# Patient Record
Sex: Male | Born: 1980 | Race: Black or African American | Hispanic: No | Marital: Single | Smoking: Never smoker
Health system: Southern US, Community
[De-identification: ages and names within clinical notes are randomized; demographics above are authoritative.]

## PROBLEM LIST (undated history)

## (undated) DIAGNOSIS — I10 Essential (primary) hypertension: Secondary | ICD-10-CM

## (undated) DIAGNOSIS — K529 Noninfective gastroenteritis and colitis, unspecified: Secondary | ICD-10-CM

## (undated) DIAGNOSIS — E119 Type 2 diabetes mellitus without complications: Secondary | ICD-10-CM

## (undated) DIAGNOSIS — Z21 Asymptomatic human immunodeficiency virus [HIV] infection status: Secondary | ICD-10-CM

## (undated) DIAGNOSIS — A539 Syphilis, unspecified: Secondary | ICD-10-CM

## (undated) DIAGNOSIS — C801 Malignant (primary) neoplasm, unspecified: Secondary | ICD-10-CM

## (undated) DIAGNOSIS — J45909 Unspecified asthma, uncomplicated: Secondary | ICD-10-CM

## (undated) HISTORY — DX: Type 2 diabetes mellitus without complications: E11.9

## (undated) HISTORY — DX: Malignant (primary) neoplasm, unspecified: C80.1

## (undated) HISTORY — DX: Essential (primary) hypertension: I10

## (undated) HISTORY — PX: BRAIN SURGERY: SHX531

## (undated) HISTORY — DX: Asymptomatic human immunodeficiency virus (hiv) infection status: Z21

## (undated) HISTORY — DX: Syphilis, unspecified: A53.9

---
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
## 2009-03-21 ENCOUNTER — Ambulatory Visit: Payer: Self-pay | Admitting: Surgery

## 2009-03-21 ENCOUNTER — Inpatient Hospital Stay (HOSPITAL_COMMUNITY): Admission: EM | Admit: 2009-03-21 | Discharge: 2009-04-02 | Payer: Self-pay | Admitting: Emergency Medicine

## 2009-03-21 ENCOUNTER — Encounter (INDEPENDENT_AMBULATORY_CARE_PROVIDER_SITE_OTHER): Payer: Self-pay | Admitting: Emergency Medicine

## 2009-03-22 ENCOUNTER — Ambulatory Visit: Payer: Self-pay | Admitting: Internal Medicine

## 2009-03-22 ENCOUNTER — Ambulatory Visit: Payer: Self-pay | Admitting: Oncology

## 2009-03-23 ENCOUNTER — Ambulatory Visit: Payer: Self-pay | Admitting: Infectious Diseases

## 2009-03-23 ENCOUNTER — Encounter (INDEPENDENT_AMBULATORY_CARE_PROVIDER_SITE_OTHER): Payer: Self-pay | Admitting: Interventional Radiology

## 2009-03-23 DIAGNOSIS — A539 Syphilis, unspecified: Secondary | ICD-10-CM

## 2009-03-23 DIAGNOSIS — Z21 Asymptomatic human immunodeficiency virus [HIV] infection status: Secondary | ICD-10-CM

## 2009-03-23 HISTORY — DX: Syphilis, unspecified: A53.9

## 2009-03-23 HISTORY — DX: Asymptomatic human immunodeficiency virus (hiv) infection status: Z21

## 2009-03-24 ENCOUNTER — Encounter (INDEPENDENT_AMBULATORY_CARE_PROVIDER_SITE_OTHER): Payer: Self-pay | Admitting: Interventional Radiology

## 2009-03-24 LAB — CONVERTED CEMR LAB: RPR Ser Ql: 1:2 {titer}

## 2009-03-31 ENCOUNTER — Ambulatory Visit: Payer: Self-pay | Admitting: Oncology

## 2009-04-06 ENCOUNTER — Ambulatory Visit: Payer: Self-pay | Admitting: Infectious Diseases

## 2009-04-06 DIAGNOSIS — A539 Syphilis, unspecified: Secondary | ICD-10-CM | POA: Insufficient documentation

## 2009-04-07 LAB — CBC WITH DIFFERENTIAL/PLATELET
Eosinophils Absolute: 0.3 10*3/uL (ref 0.0–0.5)
HCT: 31.6 % — ABNORMAL LOW (ref 38.4–49.9)
LYMPH%: 12.1 % — ABNORMAL LOW (ref 14.0–49.0)
MCHC: 32 g/dL (ref 32.0–36.0)
MCV: 74.7 fL — ABNORMAL LOW (ref 79.3–98.0)
MONO%: 5 % (ref 0.0–14.0)
NEUT#: 10.6 10*3/uL — ABNORMAL HIGH (ref 1.5–6.5)
NEUT%: 80.2 % — ABNORMAL HIGH (ref 39.0–75.0)
Platelets: 175 10*3/uL (ref 140–400)
RBC: 4.23 10*6/uL (ref 4.20–5.82)
nRBC: 0 % (ref 0–0)

## 2009-04-10 ENCOUNTER — Encounter (INDEPENDENT_AMBULATORY_CARE_PROVIDER_SITE_OTHER): Payer: Self-pay | Admitting: *Deleted

## 2009-04-10 DIAGNOSIS — B20 Human immunodeficiency virus [HIV] disease: Secondary | ICD-10-CM

## 2009-04-10 DIAGNOSIS — I10 Essential (primary) hypertension: Secondary | ICD-10-CM | POA: Insufficient documentation

## 2009-04-10 DIAGNOSIS — B370 Candidal stomatitis: Secondary | ICD-10-CM | POA: Insufficient documentation

## 2009-04-10 DIAGNOSIS — I890 Lymphedema, not elsewhere classified: Secondary | ICD-10-CM

## 2009-04-10 LAB — CBC WITH DIFFERENTIAL/PLATELET
Basophils Absolute: 0.1 10*3/uL (ref 0.0–0.1)
Eosinophils Absolute: 0.3 10*3/uL (ref 0.0–0.5)
HCT: 32.4 % — ABNORMAL LOW (ref 38.4–49.9)
HGB: 10.4 g/dL — ABNORMAL LOW (ref 13.0–17.1)
NEUT#: 3.2 10*3/uL (ref 1.5–6.5)
RDW: 15.2 % — ABNORMAL HIGH (ref 11.0–14.6)
lymph#: 1.3 10*3/uL (ref 0.9–3.3)

## 2009-04-13 ENCOUNTER — Ambulatory Visit: Payer: Self-pay | Admitting: Infectious Diseases

## 2009-04-16 ENCOUNTER — Ambulatory Visit (HOSPITAL_COMMUNITY): Admission: RE | Admit: 2009-04-16 | Discharge: 2009-04-16 | Payer: Self-pay | Admitting: Oncology

## 2009-04-21 ENCOUNTER — Encounter: Payer: Self-pay | Admitting: Infectious Diseases

## 2009-04-21 ENCOUNTER — Ambulatory Visit: Payer: Self-pay | Admitting: Oncology

## 2009-04-21 LAB — CBC WITH DIFFERENTIAL/PLATELET
Basophils Absolute: 0 10*3/uL (ref 0.0–0.1)
Eosinophils Absolute: 0.1 10*3/uL (ref 0.0–0.5)
HCT: 30.7 % — ABNORMAL LOW (ref 38.4–49.9)
HGB: 10 g/dL — ABNORMAL LOW (ref 13.0–17.1)
LYMPH%: 26.3 % (ref 14.0–49.0)
MONO#: 0.3 10*3/uL (ref 0.1–0.9)
NEUT#: 1.4 10*3/uL — ABNORMAL LOW (ref 1.5–6.5)
NEUT%: 56.3 % (ref 39.0–75.0)
Platelets: 228 10*3/uL (ref 140–400)
WBC: 2.5 10*3/uL — ABNORMAL LOW (ref 4.0–10.3)

## 2009-04-21 LAB — COMPREHENSIVE METABOLIC PANEL
CO2: 24 mEq/L (ref 19–32)
Glucose, Bld: 88 mg/dL (ref 70–99)
Sodium: 140 mEq/L (ref 135–145)
Total Bilirubin: 2.2 mg/dL — ABNORMAL HIGH (ref 0.3–1.2)
Total Protein: 7.7 g/dL (ref 6.0–8.3)

## 2009-04-29 ENCOUNTER — Ambulatory Visit: Payer: Self-pay | Admitting: Family Medicine

## 2009-04-29 ENCOUNTER — Encounter: Payer: Self-pay | Admitting: Infectious Diseases

## 2009-04-29 LAB — MANUAL DIFFERENTIAL (CHCC SATELLITE)
ANC (CHCC HP manual diff): 1.1 10*3/uL — ABNORMAL LOW (ref 1.5–6.5)
Blasts: 1 % — ABNORMAL HIGH (ref 0–0)
Eos: 10 % — ABNORMAL HIGH (ref 0–7)
LYMPH: 28 % (ref 14–48)
MONO: 15 % — ABNORMAL HIGH (ref 0–13)
PLT EST ~~LOC~~: ADEQUATE
Variant Lymph: 13 % — ABNORMAL HIGH (ref 0–0)

## 2009-04-29 LAB — CMP (CANCER CENTER ONLY)
AST: 26 U/L (ref 11–38)
Albumin: 3.7 g/dL (ref 3.3–5.5)
Alkaline Phosphatase: 90 U/L — ABNORMAL HIGH (ref 26–84)
Glucose, Bld: 114 mg/dL (ref 73–118)
Potassium: 4.2 mEq/L (ref 3.3–4.7)
Sodium: 138 mEq/L (ref 128–145)
Total Protein: 8 g/dL (ref 6.4–8.1)

## 2009-04-29 LAB — CBC WITH DIFFERENTIAL (CANCER CENTER ONLY)
MCV: 80 fL — ABNORMAL LOW (ref 82–98)
Platelets: 220 10*3/uL (ref 145–400)
RDW: 19 % — ABNORMAL HIGH (ref 10.5–14.6)
WBC: 3.3 10*3/uL — ABNORMAL LOW (ref 4.0–10.0)

## 2009-04-29 LAB — LACTATE DEHYDROGENASE: LDH: 229 U/L (ref 94–250)

## 2009-05-01 ENCOUNTER — Telehealth (INDEPENDENT_AMBULATORY_CARE_PROVIDER_SITE_OTHER): Payer: Self-pay | Admitting: *Deleted

## 2009-05-06 ENCOUNTER — Encounter: Payer: Self-pay | Admitting: Infectious Diseases

## 2009-05-06 LAB — MORPHOLOGY - CHCC SATELLITE

## 2009-05-06 LAB — CBC WITH DIFFERENTIAL (CANCER CENTER ONLY)
BASO#: 0 10*3/uL (ref 0.0–0.2)
BASO%: 0.4 % (ref 0.0–2.0)
EOS%: 14.1 % — ABNORMAL HIGH (ref 0.0–7.0)
HGB: 12 g/dL — ABNORMAL LOW (ref 13.0–17.1)
LYMPH#: 1.9 10*3/uL (ref 0.9–3.3)
MCHC: 31.6 g/dL — ABNORMAL LOW (ref 32.0–35.9)
NEUT#: 1.8 10*3/uL (ref 1.5–6.5)
Platelets: 203 10*3/uL (ref 145–400)
RDW: 19.3 % — ABNORMAL HIGH (ref 10.5–14.6)

## 2009-05-06 LAB — CMP (CANCER CENTER ONLY)
ALT(SGPT): 19 U/L (ref 10–47)
AST: 26 U/L (ref 11–38)
Creat: 0.9 mg/dl (ref 0.6–1.2)
Total Bilirubin: 1.4 mg/dl (ref 0.20–1.60)

## 2009-05-26 ENCOUNTER — Ambulatory Visit: Payer: Self-pay | Admitting: Oncology

## 2009-05-27 ENCOUNTER — Encounter: Payer: Self-pay | Admitting: Infectious Diseases

## 2009-05-27 LAB — CBC WITH DIFFERENTIAL (CANCER CENTER ONLY)
BASO#: 0 10*3/uL (ref 0.0–0.2)
EOS%: 4.4 % (ref 0.0–7.0)
HCT: 39.6 % (ref 38.7–49.9)
HGB: 12.2 g/dL — ABNORMAL LOW (ref 13.0–17.1)
LYMPH#: 1.5 10*3/uL (ref 0.9–3.3)
MCH: 25 pg — ABNORMAL LOW (ref 28.0–33.4)
MCHC: 30.8 g/dL — ABNORMAL LOW (ref 32.0–35.9)
MCV: 81 fL — ABNORMAL LOW (ref 82–98)
MONO%: 13.8 % — ABNORMAL HIGH (ref 0.0–13.0)
NEUT%: 38.1 % — ABNORMAL LOW (ref 40.0–80.0)

## 2009-05-27 LAB — CMP (CANCER CENTER ONLY)
Albumin: 3.4 g/dL (ref 3.3–5.5)
Alkaline Phosphatase: 106 U/L — ABNORMAL HIGH (ref 26–84)
BUN, Bld: 15 mg/dL (ref 7–22)
CO2: 28 mEq/L (ref 18–33)
Calcium: 9.2 mg/dL (ref 8.0–10.3)
Chloride: 105 mEq/L (ref 98–108)
Glucose, Bld: 98 mg/dL (ref 73–118)
Potassium: 4.3 mEq/L (ref 3.3–4.7)
Sodium: 138 mEq/L (ref 128–145)
Total Protein: 8 g/dL (ref 6.4–8.1)

## 2009-06-03 LAB — CBC WITH DIFFERENTIAL (CANCER CENTER ONLY)
HGB: 12.5 g/dL — ABNORMAL LOW (ref 13.0–17.1)
MCH: 25.5 pg — ABNORMAL LOW (ref 28.0–33.4)
MCHC: 31.4 g/dL — ABNORMAL LOW (ref 32.0–35.9)

## 2009-06-03 LAB — MANUAL DIFFERENTIAL (CHCC SATELLITE)
ALC: 1.5 10*3/uL (ref 0.9–3.3)
ANC (CHCC HP manual diff): 12.1 10*3/uL — ABNORMAL HIGH (ref 1.5–6.5)
Band Neutrophils: 6 % (ref 0–10)
Platelet Morphology: NORMAL

## 2009-06-03 LAB — IRON AND TIBC: %SAT: 39 % (ref 20–55)

## 2009-06-03 LAB — FERRITIN: Ferritin: 287 ng/mL (ref 22–322)

## 2009-06-03 LAB — BASIC METABOLIC PANEL - CANCER CENTER ONLY
Calcium: 9.3 mg/dL (ref 8.0–10.3)
Glucose, Bld: 94 mg/dL (ref 73–118)
Potassium: 4.4 mEq/L (ref 3.3–4.7)
Sodium: 138 mEq/L (ref 128–145)

## 2009-06-16 ENCOUNTER — Encounter: Payer: Self-pay | Admitting: Oncology

## 2009-06-16 ENCOUNTER — Ambulatory Visit: Payer: Self-pay

## 2009-06-18 ENCOUNTER — Ambulatory Visit (HOSPITAL_COMMUNITY): Admission: RE | Admit: 2009-06-18 | Discharge: 2009-06-18 | Payer: Self-pay | Admitting: Oncology

## 2009-06-19 ENCOUNTER — Ambulatory Visit: Payer: Self-pay | Admitting: Oncology

## 2009-06-24 ENCOUNTER — Encounter: Payer: Self-pay | Admitting: Infectious Diseases

## 2009-06-24 LAB — CBC WITH DIFFERENTIAL (CANCER CENTER ONLY)
BASO#: 0 10*3/uL (ref 0.0–0.2)
EOS%: 4 % (ref 0.0–7.0)
HCT: 43.3 % (ref 38.7–49.9)
LYMPH#: 1.3 10*3/uL (ref 0.9–3.3)
LYMPH%: 37.2 % (ref 14.0–48.0)
MCH: 25.2 pg — ABNORMAL LOW (ref 28.0–33.4)
MCHC: 30.8 g/dL — ABNORMAL LOW (ref 32.0–35.9)
MCV: 82 fL (ref 82–98)
MONO%: 9.8 % (ref 0.0–13.0)
NEUT%: 48.2 % (ref 40.0–80.0)
RDW: 15.2 % — ABNORMAL HIGH (ref 10.5–14.6)

## 2009-06-24 LAB — CMP (CANCER CENTER ONLY)
ALT(SGPT): 20 U/L (ref 10–47)
BUN, Bld: 13 mg/dL (ref 7–22)
CO2: 30 mEq/L (ref 18–33)
Calcium: 9.1 mg/dL (ref 8.0–10.3)
Chloride: 100 mEq/L (ref 98–108)
Creat: 1 mg/dl (ref 0.6–1.2)
Total Bilirubin: 3.7 mg/dl — ABNORMAL HIGH (ref 0.20–1.60)

## 2009-07-02 ENCOUNTER — Encounter: Payer: Self-pay | Admitting: Infectious Diseases

## 2009-07-02 LAB — CBC WITH DIFFERENTIAL (CANCER CENTER ONLY)
HCT: 43.9 % (ref 38.7–49.9)
HGB: 13.8 g/dL (ref 13.0–17.1)
MCV: 82 fL (ref 82–98)
RBC: 5.38 10*6/uL (ref 4.20–5.70)
WBC: 9.3 10*3/uL (ref 4.0–10.0)

## 2009-07-02 LAB — BASIC METABOLIC PANEL - CANCER CENTER ONLY
CO2: 31 mEq/L (ref 18–33)
Calcium: 8.8 mg/dL (ref 8.0–10.3)
Chloride: 110 mEq/L — ABNORMAL HIGH (ref 98–108)
Glucose, Bld: 96 mg/dL (ref 73–118)
Sodium: 139 mEq/L (ref 128–145)

## 2009-07-02 LAB — MANUAL DIFFERENTIAL (CHCC SATELLITE)
Eos: 1 % (ref 0–7)
LYMPH: 14 % (ref 14–48)
MONO: 4 % (ref 0–13)
SEG: 81 % — ABNORMAL HIGH (ref 40–75)

## 2009-07-20 ENCOUNTER — Ambulatory Visit: Payer: Self-pay | Admitting: Oncology

## 2009-08-13 ENCOUNTER — Ambulatory Visit: Payer: Self-pay | Admitting: Oncology

## 2009-08-18 ENCOUNTER — Encounter: Payer: Self-pay | Admitting: Infectious Diseases

## 2009-08-18 LAB — CMP (CANCER CENTER ONLY)
BUN, Bld: 20 mg/dL (ref 7–22)
CO2: 31 mEq/L (ref 18–33)
Creat: 1 mg/dl (ref 0.6–1.2)
Glucose, Bld: 91 mg/dL (ref 73–118)
Total Bilirubin: 2.3 mg/dl — ABNORMAL HIGH (ref 0.20–1.60)

## 2009-08-18 LAB — CBC WITH DIFFERENTIAL (CANCER CENTER ONLY)
BASO#: 0 10*3/uL (ref 0.0–0.2)
BASO%: 0.6 % (ref 0.0–2.0)
Eosinophils Absolute: 0.1 10*3/uL (ref 0.0–0.5)
HCT: 46.1 % (ref 38.7–49.9)
LYMPH%: 41.8 % (ref 14.0–48.0)
MCV: 83 fL (ref 82–98)
MONO#: 0.3 10*3/uL (ref 0.1–0.9)
NEUT%: 49.7 % (ref 40.0–80.0)
RDW: 13.2 % (ref 10.5–14.6)
WBC: 4.8 10*3/uL (ref 4.0–10.0)

## 2009-08-26 ENCOUNTER — Encounter: Payer: Self-pay | Admitting: Infectious Diseases

## 2009-08-26 LAB — CBC WITH DIFFERENTIAL (CANCER CENTER ONLY)
Eosinophils Absolute: 0.2 10*3/uL (ref 0.0–0.5)
HCT: 48.1 % (ref 38.7–49.9)
HGB: 15.2 g/dL (ref 13.0–17.1)
LYMPH#: 2 10*3/uL (ref 0.9–3.3)
LYMPH%: 23.5 % (ref 14.0–48.0)
MCV: 82 fL (ref 82–98)
MONO#: 1.9 10*3/uL — ABNORMAL HIGH (ref 0.1–0.9)
NEUT%: 51.5 % (ref 40.0–80.0)
RBC: 5.83 10*6/uL — ABNORMAL HIGH (ref 4.20–5.70)
WBC: 8.6 10*3/uL (ref 4.0–10.0)

## 2009-08-26 LAB — BASIC METABOLIC PANEL - CANCER CENTER ONLY
CO2: 31 mEq/L (ref 18–33)
Calcium: 9.5 mg/dL (ref 8.0–10.3)
Chloride: 101 mEq/L (ref 98–108)
Creat: 1.3 mg/dl — ABNORMAL HIGH (ref 0.6–1.2)
Glucose, Bld: 95 mg/dL (ref 73–118)

## 2009-09-16 ENCOUNTER — Ambulatory Visit: Payer: Self-pay | Admitting: Oncology

## 2009-09-22 LAB — CBC WITH DIFFERENTIAL (CANCER CENTER ONLY)
BASO%: 0.5 % (ref 0.0–2.0)
LYMPH%: 43 % (ref 14.0–48.0)
MCH: 25.8 pg — ABNORMAL LOW (ref 28.0–33.4)
MCV: 79 fL — ABNORMAL LOW (ref 82–98)
MONO%: 6 % (ref 0.0–13.0)
Platelets: 206 10*3/uL (ref 145–400)
RDW: 14.1 % (ref 10.5–14.6)
WBC: 5.1 10*3/uL (ref 4.0–10.0)

## 2009-09-22 LAB — CMP (CANCER CENTER ONLY)
ALT(SGPT): 30 U/L (ref 10–47)
Alkaline Phosphatase: 113 U/L — ABNORMAL HIGH (ref 26–84)
Sodium: 134 mEq/L (ref 128–145)
Total Bilirubin: 3.7 mg/dl — ABNORMAL HIGH (ref 0.20–1.60)
Total Protein: 8.6 g/dL — ABNORMAL HIGH (ref 6.4–8.1)

## 2009-09-29 ENCOUNTER — Encounter: Payer: Self-pay | Admitting: Infectious Diseases

## 2009-09-29 LAB — CMP (CANCER CENTER ONLY)
ALT(SGPT): 28 U/L (ref 10–47)
AST: 23 U/L (ref 11–38)
Albumin: 3.8 g/dL (ref 3.3–5.5)
Calcium: 9 mg/dL (ref 8.0–10.3)
Chloride: 104 mEq/L (ref 98–108)
Potassium: 4.4 mEq/L (ref 3.3–4.7)
Sodium: 138 mEq/L (ref 128–145)

## 2009-09-29 LAB — MANUAL DIFFERENTIAL (CHCC SATELLITE)
Band Neutrophils: 6 % (ref 0–10)
Eos: 2 % (ref 0–7)

## 2009-09-29 LAB — CBC WITH DIFFERENTIAL (CANCER CENTER ONLY)
Platelets: 149 10*3/uL (ref 145–400)
RDW: 14.4 % (ref 10.5–14.6)
WBC: 13.8 10*3/uL — ABNORMAL HIGH (ref 4.0–10.0)

## 2009-10-08 ENCOUNTER — Encounter: Payer: Self-pay | Admitting: Infectious Diseases

## 2009-10-09 LAB — CMP (CANCER CENTER ONLY)
ALT(SGPT): 18 U/L (ref 10–47)
Alkaline Phosphatase: 100 U/L — ABNORMAL HIGH (ref 26–84)
CO2: 30 mEq/L (ref 18–33)
Potassium: 4.3 mEq/L (ref 3.3–4.7)
Sodium: 136 mEq/L (ref 128–145)
Total Bilirubin: 3.9 mg/dl — ABNORMAL HIGH (ref 0.20–1.60)
Total Protein: 7.8 g/dL (ref 6.4–8.1)

## 2009-10-09 LAB — CBC WITH DIFFERENTIAL (CANCER CENTER ONLY)
BASO%: 0.9 % (ref 0.0–2.0)
LYMPH%: 39.1 % (ref 14.0–48.0)
MCV: 79 fL — ABNORMAL LOW (ref 82–98)
MONO#: 0.2 10*3/uL (ref 0.1–0.9)
NEUT#: 1.7 10*3/uL (ref 1.5–6.5)
Platelets: 202 10*3/uL (ref 145–400)
RDW: 14.4 % (ref 10.5–14.6)
WBC: 3.3 10*3/uL — ABNORMAL LOW (ref 4.0–10.0)

## 2009-10-12 ENCOUNTER — Ambulatory Visit (HOSPITAL_COMMUNITY): Admission: RE | Admit: 2009-10-12 | Discharge: 2009-10-12 | Payer: Self-pay | Admitting: Oncology

## 2009-10-14 ENCOUNTER — Ambulatory Visit (HOSPITAL_COMMUNITY): Admission: RE | Admit: 2009-10-14 | Discharge: 2009-10-14 | Payer: Self-pay | Admitting: Oncology

## 2009-10-16 ENCOUNTER — Ambulatory Visit: Payer: Self-pay | Admitting: Oncology

## 2009-10-20 ENCOUNTER — Encounter: Payer: Self-pay | Admitting: Infectious Diseases

## 2009-11-03 ENCOUNTER — Encounter: Payer: Self-pay | Admitting: Infectious Diseases

## 2009-11-10 ENCOUNTER — Ambulatory Visit: Payer: Self-pay | Admitting: Infectious Diseases

## 2009-11-10 LAB — CONVERTED CEMR LAB
ALT: 18 units/L (ref 0–53)
Albumin: 4.3 g/dL (ref 3.5–5.2)
CO2: 21 meq/L (ref 19–32)
Calcium: 9.5 mg/dL (ref 8.4–10.5)
Chloride: 104 meq/L (ref 96–112)
Creatinine, Ser: 1.17 mg/dL (ref 0.40–1.50)
Eosinophils Absolute: 0.2 10*3/uL (ref 0.0–0.7)
HIV 1 RNA Quant: <48 copies/mL (ref <48)
Lymphocytes Relative: 39 % (ref 12–46)
Lymphs Abs: 1.8 10*3/uL (ref 0.7–4.0)
MCV: 78.5 fL (ref 78.0–100.0)
Monocytes Relative: 8 % (ref 3–12)
Neutrophils Relative %: 50 % (ref 43–77)
Potassium: 4.2 meq/L (ref 3.5–5.3)
RBC: 5.94 M/uL — ABNORMAL HIGH (ref 4.22–5.81)
Sodium: 139 meq/L (ref 135–145)
Total Protein: 8.3 g/dL (ref 6.0–8.3)
WBC: 4.6 10*3/uL (ref 4.0–10.5)

## 2009-11-19 ENCOUNTER — Ambulatory Visit: Payer: Self-pay | Admitting: Oncology

## 2009-11-20 ENCOUNTER — Encounter: Payer: Self-pay | Admitting: Infectious Diseases

## 2009-11-20 LAB — CBC WITH DIFFERENTIAL (CANCER CENTER ONLY)
BASO#: 0 10*3/uL (ref 0.0–0.2)
Eosinophils Absolute: 0.2 10*3/uL (ref 0.0–0.5)
HCT: 47.6 % (ref 38.7–49.9)
HGB: 15.5 g/dL (ref 13.0–17.1)
LYMPH#: 1.8 10*3/uL (ref 0.9–3.3)
LYMPH%: 46.5 % (ref 14.0–48.0)
MCV: 78 fL — ABNORMAL LOW (ref 82–98)
MONO#: 0.3 10*3/uL (ref 0.1–0.9)
NEUT%: 42.3 % (ref 40.0–80.0)
RBC: 6.06 10*6/uL — ABNORMAL HIGH (ref 4.20–5.70)
RDW: 14.3 % (ref 10.5–14.6)
WBC: 3.9 10*3/uL — ABNORMAL LOW (ref 4.0–10.0)

## 2009-11-20 LAB — CMP (CANCER CENTER ONLY)
ALT(SGPT): 22 U/L (ref 10–47)
AST: 26 U/L (ref 11–38)
Alkaline Phosphatase: 110 U/L — ABNORMAL HIGH (ref 26–84)
BUN, Bld: 13 mg/dL (ref 7–22)
Calcium: 8.5 mg/dL (ref 8.0–10.3)
Creat: 1.1 mg/dl (ref 0.6–1.2)
Total Bilirubin: 6.2 mg/dl — ABNORMAL HIGH (ref 0.20–1.60)

## 2009-11-20 LAB — HEPATIC FUNCTION PANEL
AST: 21 U/L (ref 0–37)
Albumin: 3.9 g/dL (ref 3.5–5.2)
Alkaline Phosphatase: 114 U/L (ref 39–117)
Indirect Bilirubin: 6.4 mg/dL — ABNORMAL HIGH (ref 0.0–0.9)
Total Protein: 8 g/dL (ref 6.0–8.3)

## 2009-11-23 ENCOUNTER — Ambulatory Visit: Payer: Self-pay | Admitting: Infectious Diseases

## 2009-11-23 ENCOUNTER — Ambulatory Visit (HOSPITAL_COMMUNITY): Admission: RE | Admit: 2009-11-23 | Discharge: 2009-11-23 | Payer: Self-pay | Admitting: Oncology

## 2009-12-01 ENCOUNTER — Encounter: Payer: Self-pay | Admitting: Infectious Diseases

## 2009-12-01 LAB — CBC WITH DIFFERENTIAL (CANCER CENTER ONLY)
BASO#: 0 10*3/uL (ref 0.0–0.2)
EOS%: 0.8 % (ref 0.0–7.0)
Eosinophils Absolute: 0 10*3/uL (ref 0.0–0.5)
LYMPH%: 23 % (ref 14.0–48.0)
MCH: 25.8 pg — ABNORMAL LOW (ref 28.0–33.4)
MCHC: 32 g/dL (ref 32.0–35.9)
MCV: 81 fL — ABNORMAL LOW (ref 82–98)
MONO%: 1 % (ref 0.0–13.0)
NEUT#: 3.3 10*3/uL (ref 1.5–6.5)
Platelets: 207 10*3/uL (ref 145–400)
RBC: 6.36 10*6/uL — ABNORMAL HIGH (ref 4.20–5.70)

## 2009-12-01 LAB — CMP (CANCER CENTER ONLY)
AST: 34 U/L (ref 11–38)
Alkaline Phosphatase: 122 U/L — ABNORMAL HIGH (ref 26–84)
BUN, Bld: 15 mg/dL (ref 7–22)
Glucose, Bld: 162 mg/dL — ABNORMAL HIGH (ref 73–118)
Sodium: 133 mEq/L (ref 128–145)
Total Bilirubin: 1.1 mg/dl (ref 0.20–1.60)

## 2009-12-08 ENCOUNTER — Encounter: Payer: Self-pay | Admitting: Infectious Diseases

## 2009-12-08 LAB — BASIC METABOLIC PANEL - CANCER CENTER ONLY
BUN, Bld: 13 mg/dL (ref 7–22)
Calcium: 9.2 mg/dL (ref 8.0–10.3)
Creat: 1 mg/dl (ref 0.6–1.2)
Glucose, Bld: 98 mg/dL (ref 73–118)
Potassium: 5 mEq/L — ABNORMAL HIGH (ref 3.3–4.7)

## 2009-12-08 LAB — CBC WITH DIFFERENTIAL (CANCER CENTER ONLY)
BASO#: 0 10*3/uL (ref 0.0–0.2)
EOS%: 7.5 % — ABNORMAL HIGH (ref 0.0–7.0)
Eosinophils Absolute: 0.2 10*3/uL (ref 0.0–0.5)
HCT: 45.7 % (ref 38.7–49.9)
HGB: 14.4 g/dL (ref 13.0–17.1)
LYMPH%: 53.8 % — ABNORMAL HIGH (ref 14.0–48.0)
MCH: 25.4 pg — ABNORMAL LOW (ref 28.0–33.4)
MCHC: 31.5 g/dL — ABNORMAL LOW (ref 32.0–35.9)
MCV: 81 fL — ABNORMAL LOW (ref 82–98)
MONO%: 4.1 % (ref 0.0–13.0)
NEUT%: 34 % — ABNORMAL LOW (ref 40.0–80.0)
RBC: 5.67 10*6/uL (ref 4.20–5.70)

## 2009-12-15 ENCOUNTER — Ambulatory Visit: Payer: Self-pay | Admitting: Oncology

## 2009-12-22 ENCOUNTER — Encounter: Payer: Self-pay | Admitting: Infectious Diseases

## 2009-12-22 LAB — CBC WITH DIFFERENTIAL (CANCER CENTER ONLY)
BASO#: 0.1 10*3/uL (ref 0.0–0.2)
BASO%: 0.8 % (ref 0.0–2.0)
HCT: 47 % (ref 38.7–49.9)
HGB: 14.9 g/dL (ref 13.0–17.1)
LYMPH%: 9.3 % — ABNORMAL LOW (ref 14.0–48.0)
MCHC: 31.7 g/dL — ABNORMAL LOW (ref 32.0–35.9)
MCV: 80 fL — ABNORMAL LOW (ref 82–98)
MONO#: 1.4 10*3/uL — ABNORMAL HIGH (ref 0.1–0.9)
NEUT%: 80.3 % — ABNORMAL HIGH (ref 40.0–80.0)
RDW: 14.6 % (ref 10.5–14.6)

## 2009-12-22 LAB — CMP (CANCER CENTER ONLY)
ALT(SGPT): 31 U/L (ref 10–47)
BUN, Bld: 17 mg/dL (ref 7–22)
CO2: 27 mEq/L (ref 18–33)
Calcium: 9.7 mg/dL (ref 8.0–10.3)
Creat: 1.3 mg/dl — ABNORMAL HIGH (ref 0.6–1.2)
Total Bilirubin: 0.9 mg/dl (ref 0.20–1.60)

## 2009-12-23 ENCOUNTER — Ambulatory Visit: Payer: Self-pay | Admitting: Infectious Diseases

## 2009-12-29 ENCOUNTER — Inpatient Hospital Stay (HOSPITAL_COMMUNITY): Admission: EM | Admit: 2009-12-29 | Discharge: 2010-01-06 | Payer: Self-pay | Admitting: Emergency Medicine

## 2009-12-29 ENCOUNTER — Ambulatory Visit: Payer: Self-pay | Admitting: Internal Medicine

## 2009-12-29 ENCOUNTER — Encounter: Payer: Self-pay | Admitting: Internal Medicine

## 2009-12-31 ENCOUNTER — Ambulatory Visit: Payer: Self-pay | Admitting: Internal Medicine

## 2010-01-01 LAB — CONVERTED CEMR LAB: Hemoglobin: 11.2 g/dL

## 2010-01-06 ENCOUNTER — Encounter: Payer: Self-pay | Admitting: Internal Medicine

## 2010-01-06 DIAGNOSIS — N259 Disorder resulting from impaired renal tubular function, unspecified: Secondary | ICD-10-CM | POA: Insufficient documentation

## 2010-01-06 DIAGNOSIS — J159 Unspecified bacterial pneumonia: Secondary | ICD-10-CM | POA: Insufficient documentation

## 2010-01-06 LAB — CONVERTED CEMR LAB
Hemoglobin: 11.2 g/dL
Platelets: 199 10*3/uL
WBC: 30.3 10*3/uL

## 2010-01-12 ENCOUNTER — Encounter: Payer: Self-pay | Admitting: Infectious Diseases

## 2010-01-12 LAB — CBC WITH DIFFERENTIAL (CANCER CENTER ONLY)
BASO%: 0.5 % (ref 0.0–2.0)
LYMPH%: 24 % (ref 14.0–48.0)
MCV: 76 fL — ABNORMAL LOW (ref 82–98)
MONO#: 0.2 10*3/uL (ref 0.1–0.9)
Platelets: 286 10*3/uL (ref 145–400)
RDW: 14.4 % (ref 10.5–14.6)
WBC: 3 10*3/uL — ABNORMAL LOW (ref 4.0–10.0)

## 2010-01-12 LAB — CMP (CANCER CENTER ONLY)
ALT(SGPT): 59 U/L — ABNORMAL HIGH (ref 10–47)
Alkaline Phosphatase: 109 U/L — ABNORMAL HIGH (ref 26–84)
CO2: 29 mEq/L (ref 18–33)
Sodium: 136 mEq/L (ref 128–145)
Total Bilirubin: 1 mg/dl (ref 0.20–1.60)
Total Protein: 7.7 g/dL (ref 6.4–8.1)

## 2010-01-20 ENCOUNTER — Ambulatory Visit: Payer: Self-pay | Admitting: Oncology

## 2010-01-22 LAB — CONVERTED CEMR LAB
CD4 Count: 60 microliters
CD4 T Helper %: 6 %

## 2010-01-26 ENCOUNTER — Encounter: Payer: Self-pay | Admitting: Infectious Diseases

## 2010-01-26 LAB — CMP (CANCER CENTER ONLY)
ALT(SGPT): 38 U/L (ref 10–47)
Alkaline Phosphatase: 139 U/L — ABNORMAL HIGH (ref 26–84)
Sodium: 137 mEq/L (ref 128–145)
Total Bilirubin: 1.4 mg/dl (ref 0.20–1.60)
Total Protein: 8 g/dL (ref 6.4–8.1)

## 2010-01-26 LAB — CBC WITH DIFFERENTIAL (CANCER CENTER ONLY)
BASO%: 0.2 % (ref 0.0–2.0)
EOS%: 0.2 % (ref 0.0–7.0)
LYMPH%: 46.4 % (ref 14.0–48.0)
MCH: 25.2 pg — ABNORMAL LOW (ref 28.0–33.4)
MCHC: 32.5 g/dL (ref 32.0–35.9)
MCV: 78 fL — ABNORMAL LOW (ref 82–98)
MONO%: 4.5 % (ref 0.0–13.0)
Platelets: 334 10*3/uL (ref 145–400)
RDW: 17.9 % — ABNORMAL HIGH (ref 10.5–14.6)
WBC: 1 10*3/uL — ABNORMAL LOW (ref 4.0–10.0)

## 2010-01-27 ENCOUNTER — Ambulatory Visit: Payer: Self-pay | Admitting: Infectious Diseases

## 2010-02-09 ENCOUNTER — Encounter: Payer: Self-pay | Admitting: Infectious Diseases

## 2010-02-09 LAB — CBC WITH DIFFERENTIAL (CANCER CENTER ONLY)
BASO#: 0.1 10*3/uL (ref 0.0–0.2)
Eosinophils Absolute: 0.2 10*3/uL (ref 0.0–0.5)
HCT: 38.4 % — ABNORMAL LOW (ref 38.7–49.9)
HGB: 12.4 g/dL — ABNORMAL LOW (ref 13.0–17.1)
LYMPH%: 6.9 % — ABNORMAL LOW (ref 14.0–48.0)
MCV: 77 fL — ABNORMAL LOW (ref 82–98)
MONO#: 1.5 10*3/uL — ABNORMAL HIGH (ref 0.1–0.9)
NEUT%: 83.9 % — ABNORMAL HIGH (ref 40.0–80.0)
RBC: 5.01 10*6/uL (ref 4.20–5.70)
WBC: 20.1 10*3/uL — ABNORMAL HIGH (ref 4.0–10.0)

## 2010-02-09 LAB — CMP (CANCER CENTER ONLY)
ALT(SGPT): 22 U/L (ref 10–47)
AST: 21 U/L (ref 11–38)
Albumin: 4.3 g/dL (ref 3.3–5.5)
Alkaline Phosphatase: 127 U/L — ABNORMAL HIGH (ref 26–84)
CO2: 27 mEq/L (ref 18–33)
Creat: 1.1 mg/dl (ref 0.6–1.2)
Glucose, Bld: 148 mg/dL — ABNORMAL HIGH (ref 73–118)

## 2010-02-16 LAB — MANUAL DIFFERENTIAL (CHCC SATELLITE)
ANC (CHCC HP manual diff): 2.9 10*3/uL (ref 1.5–6.5)
Band Neutrophils: 4 % (ref 0–10)
Eos: 14 % — ABNORMAL HIGH (ref 0–7)
LYMPH: 11 % — ABNORMAL LOW (ref 14–48)
SEG: 65 % (ref 40–75)

## 2010-02-16 LAB — CBC WITH DIFFERENTIAL (CANCER CENTER ONLY)
HGB: 10 g/dL — ABNORMAL LOW (ref 13.0–17.1)
MCH: 24.7 pg — ABNORMAL LOW (ref 28.0–33.4)
MCHC: 32.5 g/dL (ref 32.0–35.9)
RDW: 16.7 % — ABNORMAL HIGH (ref 10.5–14.6)

## 2010-02-26 ENCOUNTER — Ambulatory Visit: Payer: Self-pay | Admitting: Oncology

## 2010-03-02 ENCOUNTER — Encounter: Admission: RE | Admit: 2010-03-02 | Discharge: 2010-03-02 | Payer: Self-pay | Admitting: Oncology

## 2010-03-02 ENCOUNTER — Encounter: Payer: Self-pay | Admitting: Infectious Diseases

## 2010-03-02 LAB — CBC WITH DIFFERENTIAL (CANCER CENTER ONLY)
BASO%: 0.6 % (ref 0.0–2.0)
LYMPH#: 1.2 10*3/uL (ref 0.9–3.3)
MONO#: 0.4 10*3/uL (ref 0.1–0.9)
Platelets: 244 10*3/uL (ref 145–400)
RBC: 4.97 10*6/uL (ref 4.20–5.70)
RDW: 18.1 % — ABNORMAL HIGH (ref 10.5–14.6)
WBC: 5 10*3/uL (ref 4.0–10.0)

## 2010-03-02 LAB — CMP (CANCER CENTER ONLY)
ALT(SGPT): 18 U/L (ref 10–47)
Albumin: 3.8 g/dL (ref 3.3–5.5)
CO2: 28 mEq/L (ref 18–33)
Calcium: 9.2 mg/dL (ref 8.0–10.3)
Chloride: 102 mEq/L (ref 98–108)
Potassium: 3.8 mEq/L (ref 3.3–4.7)
Sodium: 141 mEq/L (ref 128–145)
Total Bilirubin: 1.4 mg/dl (ref 0.20–1.60)
Total Protein: 7.7 g/dL (ref 6.4–8.1)

## 2010-03-03 ENCOUNTER — Ambulatory Visit: Payer: Self-pay | Admitting: Internal Medicine

## 2010-03-03 ENCOUNTER — Inpatient Hospital Stay (HOSPITAL_COMMUNITY): Admission: EM | Admit: 2010-03-03 | Discharge: 2010-03-17 | Payer: Self-pay | Admitting: Emergency Medicine

## 2010-03-03 ENCOUNTER — Encounter: Payer: Self-pay | Admitting: Internal Medicine

## 2010-03-13 ENCOUNTER — Ambulatory Visit: Payer: Self-pay | Admitting: Oncology

## 2010-03-17 ENCOUNTER — Encounter: Payer: Self-pay | Admitting: Internal Medicine

## 2010-03-17 DIAGNOSIS — I6200 Nontraumatic subdural hemorrhage, unspecified: Secondary | ICD-10-CM | POA: Insufficient documentation

## 2010-03-17 HISTORY — DX: Nontraumatic subdural hemorrhage, unspecified: I62.00

## 2010-03-23 ENCOUNTER — Encounter: Payer: Self-pay | Admitting: Infectious Diseases

## 2010-03-23 LAB — CMP (CANCER CENTER ONLY)
ALT(SGPT): 14 U/L (ref 10–47)
AST: 18 U/L (ref 11–38)
Creat: 1 mg/dl (ref 0.6–1.2)
Sodium: 135 mEq/L (ref 128–145)
Total Bilirubin: 0.6 mg/dl (ref 0.20–1.60)

## 2010-03-23 LAB — MANUAL DIFFERENTIAL (CHCC SATELLITE)
BASO: 2 % (ref 0–2)
Blasts: 1 % — ABNORMAL HIGH (ref 0–0)
Eos: 25 % — ABNORMAL HIGH (ref 0–7)
LYMPH: 15 % (ref 14–48)
MONO: 3 % (ref 0–13)
PLT EST ~~LOC~~: INCREASED

## 2010-03-23 LAB — CBC WITH DIFFERENTIAL (CANCER CENTER ONLY)
MCH: 23.7 pg — ABNORMAL LOW (ref 28.0–33.4)
MCV: 76 fL — ABNORMAL LOW (ref 82–98)
Platelets: 432 10*3/uL — ABNORMAL HIGH (ref 145–400)
RDW: 17.6 % — ABNORMAL HIGH (ref 10.5–14.6)
WBC: 4 10*3/uL (ref 4.0–10.0)

## 2010-03-25 ENCOUNTER — Ambulatory Visit: Payer: Self-pay | Admitting: Oncology

## 2010-03-28 LAB — LACTATE DEHYDROGENASE: LDH: 130 U/L (ref 94–250)

## 2010-03-28 LAB — FACTOR 2 ASSAY: Factor II Activity: 84 % (ref 74–131)

## 2010-03-31 ENCOUNTER — Encounter: Payer: Self-pay | Admitting: Infectious Diseases

## 2010-03-31 LAB — CMP (CANCER CENTER ONLY)
ALT(SGPT): 11 U/L (ref 10–47)
CO2: 29 mEq/L (ref 18–33)
Creat: 0.9 mg/dl (ref 0.6–1.2)
Glucose, Bld: 91 mg/dL (ref 73–118)
Total Bilirubin: 1.1 mg/dl (ref 0.20–1.60)

## 2010-03-31 LAB — CBC WITH DIFFERENTIAL (CANCER CENTER ONLY)
BASO%: 0.3 % (ref 0.0–2.0)
HCT: 35.3 % — ABNORMAL LOW (ref 38.7–49.9)
LYMPH%: 28 % (ref 14.0–48.0)
MCV: 76 fL — ABNORMAL LOW (ref 82–98)
MONO#: 0.2 10*3/uL (ref 0.1–0.9)
NEUT%: 47.9 % (ref 40.0–80.0)
RDW: 18.7 % — ABNORMAL HIGH (ref 10.5–14.6)
WBC: 4.2 10*3/uL (ref 4.0–10.0)

## 2010-04-02 ENCOUNTER — Encounter: Payer: Self-pay | Admitting: Infectious Diseases

## 2010-04-06 ENCOUNTER — Ambulatory Visit (HOSPITAL_COMMUNITY): Admission: RE | Admit: 2010-04-06 | Discharge: 2010-04-06 | Payer: Self-pay | Admitting: Neurosurgery

## 2010-04-07 ENCOUNTER — Ambulatory Visit: Payer: Self-pay | Admitting: Infectious Diseases

## 2010-04-07 LAB — CONVERTED CEMR LAB
AST: 21 units/L (ref 0–37)
Albumin: 4.2 g/dL (ref 3.5–5.2)
Alkaline Phosphatase: 95 units/L (ref 39–117)
BUN: 9 mg/dL (ref 6–23)
Basophils Relative: 0 % (ref 0–1)
Eosinophils Absolute: 1.1 10*3/uL — ABNORMAL HIGH (ref 0.0–0.7)
Eosinophils Relative: 26 % — ABNORMAL HIGH (ref 0–5)
Glucose, Bld: 77 mg/dL (ref 70–99)
HCT: 38.9 % — ABNORMAL LOW (ref 39.0–52.0)
HIV-1 RNA Quant, Log: <1.3 (ref <1.30)
Hep A Total Ab: NEGATIVE
Hep B Core Total Ab: NEGATIVE
MCHC: 30.3 g/dL (ref 30.0–36.0)
MCV: 76.6 fL — ABNORMAL LOW (ref 78.0–100.0)
Neutrophils Relative %: 38 % — ABNORMAL LOW (ref 43–77)
Platelets: 170 10*3/uL (ref 150–400)
Potassium: 3.7 meq/L (ref 3.5–5.3)
Sodium: 143 meq/L (ref 135–145)
Total Bilirubin: 1.4 mg/dL — ABNORMAL HIGH (ref 0.3–1.2)
Total Protein: 7.2 g/dL (ref 6.0–8.3)

## 2010-04-21 ENCOUNTER — Ambulatory Visit (HOSPITAL_COMMUNITY): Admission: RE | Admit: 2010-04-21 | Discharge: 2010-04-21 | Payer: Self-pay | Admitting: Neurosurgery

## 2010-04-29 ENCOUNTER — Ambulatory Visit: Payer: Self-pay | Admitting: Oncology

## 2010-05-04 ENCOUNTER — Encounter: Payer: Self-pay | Admitting: Infectious Diseases

## 2010-05-04 LAB — CBC WITH DIFFERENTIAL (CANCER CENTER ONLY)
BASO%: 0.7 % (ref 0.0–2.0)
Eosinophils Absolute: 0.4 10*3/uL (ref 0.0–0.5)
LYMPH#: 1.8 10*3/uL (ref 0.9–3.3)
MCV: 77 fL — ABNORMAL LOW (ref 82–98)
MONO#: 0.2 10*3/uL (ref 0.1–0.9)
NEUT#: 1.1 10*3/uL — ABNORMAL LOW (ref 1.5–6.5)
Platelets: 192 10*3/uL (ref 145–400)
RBC: 5.6 10*6/uL (ref 4.20–5.70)
RDW: 16.4 % — ABNORMAL HIGH (ref 10.5–14.6)
WBC: 3.3 10*3/uL — ABNORMAL LOW (ref 4.0–10.0)

## 2010-05-04 LAB — CMP (CANCER CENTER ONLY)
ALT(SGPT): 12 U/L (ref 10–47)
Albumin: 3.8 g/dL (ref 3.3–5.5)
CO2: 29 mEq/L (ref 18–33)
Calcium: 8.9 mg/dL (ref 8.0–10.3)
Chloride: 100 mEq/L (ref 98–108)
Glucose, Bld: 98 mg/dL (ref 73–118)
Potassium: 4.1 mEq/L (ref 3.3–4.7)
Sodium: 133 mEq/L (ref 128–145)
Total Bilirubin: 1.1 mg/dl (ref 0.20–1.60)
Total Protein: 7.9 g/dL (ref 6.4–8.1)

## 2010-05-06 ENCOUNTER — Encounter: Payer: Self-pay | Admitting: Infectious Diseases

## 2010-05-06 ENCOUNTER — Ambulatory Visit (HOSPITAL_COMMUNITY): Admission: RE | Admit: 2010-05-06 | Discharge: 2010-05-06 | Payer: Self-pay | Admitting: Neurosurgery

## 2010-06-04 ENCOUNTER — Ambulatory Visit: Payer: Self-pay | Admitting: Oncology

## 2010-06-08 ENCOUNTER — Encounter: Payer: Self-pay | Admitting: Infectious Diseases

## 2010-06-08 LAB — CBC WITH DIFFERENTIAL/PLATELET
Basophils Absolute: 0 10*3/uL (ref 0.0–0.1)
EOS%: 10.7 % — ABNORMAL HIGH (ref 0.0–7.0)
Eosinophils Absolute: 0.3 10*3/uL (ref 0.0–0.5)
HGB: 15 g/dL (ref 13.0–17.1)
LYMPH%: 48.9 % (ref 14.0–49.0)
MCH: 24.7 pg — ABNORMAL LOW (ref 27.2–33.4)
MCV: 75.1 fL — ABNORMAL LOW (ref 79.3–98.0)
MONO%: 8.5 % (ref 0.0–14.0)
Platelets: 135 10*3/uL — ABNORMAL LOW (ref 140–400)
RDW: 15.4 % — ABNORMAL HIGH (ref 11.0–14.6)

## 2010-06-08 LAB — COMPREHENSIVE METABOLIC PANEL
ALT: 23 U/L (ref 0–53)
Albumin: 4.8 g/dL (ref 3.5–5.2)
CO2: 27 mEq/L (ref 19–32)
Calcium: 9.4 mg/dL (ref 8.4–10.5)
Chloride: 103 mEq/L (ref 96–112)
Glucose, Bld: 95 mg/dL (ref 70–99)
Potassium: 4 mEq/L (ref 3.5–5.3)
Sodium: 141 mEq/L (ref 135–145)
Total Protein: 8.4 g/dL — ABNORMAL HIGH (ref 6.0–8.3)

## 2010-06-09 ENCOUNTER — Ambulatory Visit (HOSPITAL_COMMUNITY)
Admission: RE | Admit: 2010-06-09 | Discharge: 2010-06-09 | Payer: Self-pay | Source: Home / Self Care | Admitting: Neurosurgery

## 2010-06-09 LAB — PROTHROMBIN TIME
INR: 1 (ref <1.50)
Prothrombin Time: 13.4 seconds (ref 11.6–15.2)

## 2010-06-09 LAB — APTT: aPTT: 34 seconds (ref 24–37)

## 2010-06-16 ENCOUNTER — Telehealth (INDEPENDENT_AMBULATORY_CARE_PROVIDER_SITE_OTHER): Payer: Self-pay | Admitting: Licensed Clinical Social Worker

## 2010-06-16 ENCOUNTER — Encounter (INDEPENDENT_AMBULATORY_CARE_PROVIDER_SITE_OTHER): Payer: Self-pay | Admitting: *Deleted

## 2010-07-07 ENCOUNTER — Ambulatory Visit: Payer: Self-pay | Admitting: Oncology

## 2010-07-22 ENCOUNTER — Encounter: Payer: Self-pay | Admitting: Infectious Diseases

## 2010-08-10 NOTE — Miscellaneous (Signed)
Summary: RW Financial Update  Clinical Lists Changes  Observations: Added new observation of RWTITLE: B (06/16/2010 10:50) Added new observation of AIDSDAP: No (06/16/2010 10:50) Added new observation of PCTFPL: 69.79  (06/16/2010 10:50) Added new observation of INCOMESOURCE: wages unemployed since may 2011  (06/16/2010 10:50) Added new observation of HOUSEINCOME: 7558  (06/16/2010 10:50) Added new observation of #CHILD<18 IN: No  (06/16/2010 10:50) Added new observation of FAMILYSIZE: 1  (06/16/2010 10:50) Added new observation of HOUSING: Stable/permanent  (06/16/2010 10:50) Added new observation of YEARLYEXPEN: 0  (06/16/2010 10:50) Added new observation of FINASSESSDT: 06/16/2010  (06/16/2010 10:50)  Appended Document: RW Financial Update PATIENT STATES HIS BCBS RAN OUT AT END OF OCTOBER IS BRINGING LETTER STATING THAT SO HE CAN APPLY FOR ADAP - LET DENISE KNOW - SHE WILL TALK TO HIM ABOUT GETTING SAMPLE MEDS AS HE STATES HE IS ABOUT OUT - HE ALSO STATED HE IS WAITING FOR DISABILITY TO KICK IN AS HE MAY GET INCLUSIVE HEALTH INS.

## 2010-08-10 NOTE — Consult Note (Signed)
[facility]: [facility]   Imported By: Florinda Marker 11/13/2009 10:07:08  _____________________________________________________________________  External Attachment:    Type:   Image     Comment:   External Document

## 2010-08-10 NOTE — Consult Note (Signed)
Summary: [facility]   Imported By: Florinda Marker 02/04/2010 14:49:18  _____________________________________________________________________  External Attachment:    Type:   Image     Comment:   External Document

## 2010-08-10 NOTE — Progress Notes (Signed)
Summary: samples given  Phone Note Call from Patient   Caller: Patient Call For: patient walked in Summary of Call: Patient walked in stating that he lost his insurance and was almost out of his HIV medications. He spoke with Britta Mccreedy our ADAP coordinator and she will start to process his application. I gave the patient samples of Truvada and Isentress to get him by, and hopefully his application will be approved within 30 days. Initial call taken by: Starleen Arms CMA,  June 16, 2010 11:21 AM

## 2010-08-10 NOTE — Consult Note (Signed)
Summary: [facility]   Imported By: Florinda Marker 07/22/2009 15:45:55  _____________________________________________________________________  External Attachment:    Type:   Image     Comment:   External Document

## 2010-08-10 NOTE — Discharge Summary (Signed)
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 Neurosurgery involved, s/p craniotomy and evacuation of R hemispheric SDH 8/26 and then repeat on 9/2 for enlarging L.sided SDH. Staple sites were kept c/d/i througough w/ eventual removal before d/c and [Also did not at any point during the admission complain of headache, stiff neck, weakness or numbness] - elevated PTT (normal PT) found to be 2/2  lupus anticoagulant - PTT elevated to 48 while PT was normal. PTT did not correct with mixing studies and this was concerning for presence of both non specific vs specific inhibitors vs aquired inhibitors (unlikely given normal PTT in 2010). Eventually found to be positive for presence of lupus anticoagulant, which unfortunatelt cannot explain the spontaneous SDHs.  Of note, factor 8 levels were high/normal, ruled out presence of factor 8 inhibitor.  - HIV CD4 of 90 on Aug 25, continued his Isentress and Truvada; Bactrim and Azithromycin for prophylaxis - Kaposi Sarcoma - Taxol held in the acute setting. It was also thought that his SDH was likely secondary to this, unfortunately subdrual hem fluid cytology was unable to be completed to confirm this. Pt will f/u with Dr. Park Breed-  - Fever + Cough - likely fever was secondary to sinusitis. CXR, blood cultures, sputum gram stain were negative for active infection. [Also did not at any point during the admission complain of headache, stiff neck, weakness or numbness]. He was managed conservatively and was given Mucinex for symptomatic relief of congestion.  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)  Medication list changes:  Added new medication of ISENTRESS 400 MG TABS  (RALTEGRAVIR POTASSIUM) take one tab by mouth two times a day Added new medication of TRUVADA 200-300 MG TABS (EMTRICITABINE-TENOFOVIR) take one tab by mouth daily Added new medication of AZITHROMYCIN 600 MG TABS (AZITHROMYCIN) take 120oomg by mouth q7days  Discharge medications:  EUCERIN  CREA (SKIN PROTECTANTS, MISC.) as needed BACTRIM DS 800-160 MG TABS (SULFAMETHOXAZOLE-TRIMETHOPRIM) Take one tablet by mouth on Mondays, Wednesdays and Fridays CLOTRIMAZOLE 10 MG LOZG (CLOTRIMAZOLE) Suck on over 20 minutes 5 times a day for 7-14 days ISENTRESS 400 MG TABS (RALTEGRAVIR POTASSIUM) take one tab by mouth two times a day TRUVADA 200-300 MG TABS (EMTRICITABINE-TENOFOVIR) take one tab by mouth daily AZITHROMYCIN 600 MG TABS (AZITHROMYCIN) take 120oomg by mouth q7days

## 2010-08-10 NOTE — Consult Note (Signed)
Summary: [facility]   Imported By: Florinda Marker 05/19/2010 15:53:51  _____________________________________________________________________  External Attachment:    Type:   Image     Comment:   External Document

## 2010-08-10 NOTE — Consult Note (Signed)
Summary: [facility]   Imported By: Florinda Marker 11/03/2009 16:26:54  _____________________________________________________________________  External Attachment:    Type:   Image     Comment:   External Document

## 2010-08-10 NOTE — Consult Note (Signed)
Summary: [facility]   Imported By: Florinda Marker 07/22/2009 13:50:20  _____________________________________________________________________  External Attachment:    Type:   Image     Comment:   External Document

## 2010-08-10 NOTE — Assessment & Plan Note (Signed)
Summary: EST-CK/FU/MEDS.CFB   CC:  check up.  History of Present Illness: 30 yo M with Sept 2010 adm to [facility] with severe RLE swelling and cellulitis. He was also found on CT to have lesions on his spine. A biopsy showed KS. HIs CD4 was <10  and his VL was 31,000. Of note, his genotype showed a NNRTI resistant virus. He was started on atripla initially and then changed to atazanavir, norvir and truvada. 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 It was initially thought that his KS coul dbe improved with ART alone but after further consideration due to the extensiveness of his illness, he was started on Doxil and has recieved 6 cycles. His f/u scans have showed continued spinal lesions, some improvement in abd lesions.   Swelling in ihs legs has improved. He needs to have his ART changed so that he can change his CTX regimen. feels well, has gained wt.  CD4 90 and VL <48 (11-10-09).   Current Medications (verified): 1)  Reyataz 300 Mg Caps (Atazanavir Sulfate) .... Take 1 Capsule By Mouth Once A Day 2)  Truvada 200-300 Mg Tabs (Emtricitabine-Tenofovir) .... Take 1 Tablet By Mouth Once A Day 3)  Norvir 100 Mg Tabs (Ritonavir) .... Take 1 Tablet By Mouth Once A Day 4)  Azithromycin 250 Mg Tabs (Azithromycin) .... Take 5 Tablets By Mouth On Mondays 5)  Clonidine Hcl 0.1 Mg Tabs (Clonidine Hcl) .... Take 1 Tablet By Mouth Two Times A Day 6)  Eucerin  Crea (Skin Protectants, Misc.) .... As Needed 7)  Ferrous Gluconate 325 Mg Tabs (Ferrous Gluconate) .... Take 1 Tablet By Mouth Once A Day in Morning 8)  Zofran 4 Mg Tabs (Ondansetron Hcl) .... Take 2 Tablets By Mouth Once A Day As Needed Nausea/vomitting 9)  Bicillin L-A 1200000 Unit/5ml Susp (Penicillin G Benzathine) .... Administer Im 1.2 Milliion Units in Each Buttock One Week Apart For 2 Weeks 10)  Bactrim Ds 800-160 Mg Tabs (Sulfamethoxazole-Trimethoprim) .... Take One Tablet By Mouth On Mondays, Wednesdays and  Fridays 11)  Multivitamins  Tabs (Multiple Vitamin) .... Take 1 Tablet By Mouth Once A Day  Allergies (verified): 1)  ! Asa 2)  ! * Shellfish   Preventive Screening-Counseling & Management  Alcohol-Tobacco     Alcohol drinks/day: occassionally     Alcohol type: mixed drink     Smoking Status: never  Caffeine-Diet-Exercise     Caffeine use/day: coffee,tea and sodas occassionally     Does Patient Exercise: yes     Type of exercise: stretches     Exercise (avg: min/session): 30-60     Times/week: 7  Safety-Violence-Falls     Seat Belt Use: yes   Updated Prior Medication List: REYATAZ 300 MG CAPS (ATAZANAVIR SULFATE) Take 1 capsule by mouth once a day TRUVADA 200-300 MG TABS (EMTRICITABINE-TENOFOVIR) Take 1 tablet by mouth once a day NORVIR 100 MG TABS (RITONAVIR) Take 1 tablet by mouth once a day AZITHROMYCIN 250 MG TABS (AZITHROMYCIN) Take 5 tablets by mouth on Mondays CLONIDINE HCL 0.1 MG TABS (CLONIDINE HCL) Take 1 tablet by mouth two times a day EUCERIN  CREA (SKIN PROTECTANTS, MISC.) as needed FERROUS GLUCONATE 325 MG TABS (FERROUS GLUCONATE) Take 1 tablet by mouth once a day in morning ZOFRAN 4 MG TABS (ONDANSETRON HCL) Take 2 tablets by mouth once a day as needed nausea/vomitting BICILLIN L-A 1200000 UNIT/2ML SUSP (PENICILLIN G BENZATHINE) Administer IM 1.2 Milliion units in each buttock one  week apart for 2 weeks BACTRIM DS 800-160 MG TABS (SULFAMETHOXAZOLE-TRIMETHOPRIM) Take one tablet by mouth on Mondays, Wednesdays and Fridays MULTIVITAMINS  TABS (MULTIPLE VITAMIN) Take 1 tablet by mouth once a day  Current Allergies (reviewed today): ! ASA ! * SHELLFISH Past History:  Past medical, surgical, family and social histories (including risk factors) reviewed, and no changes noted (except as noted below).  Past Medical History: Reviewed history from 04/13/2009 and no changes required. 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 mother deceased with pancreatic cancer  Social History: Reviewed history from 04/13/2009 and no changes required. Single Never Smoked Alcohol use-no Drug use-no  Review of Systems       has not taken meds this am as he had an u/s  Vital Signs:  Patient profile:   30 year old male Height:      74 inches (187.96 cm) Weight:      265.4 pounds (120.64 kg) BMI:     34.20 Temp:     98.6 degrees F (37.00 degrees C) oral Pulse rate:   72 / minute BP sitting:   162 / 87  (left arm)  Vitals Entered By: Baxter Hire) (Nov 23, 2009 8:55 AM) CC: check up Pain Assessment Patient in pain? no      Nutritional Status BMI of > 30 = obese Nutritional Status Detail appetite is normal per patient  Have you ever been in a relationship where you felt threatened, hurt or afraid?No   Does patient need assistance? Functional Status Self care Ambulation Normal        Medication Adherence: 11/23/2009   Adherence to medications reviewed with patient. Counseling to provide adequate adherence provided   Prevention For Positives: 11/23/2009   Safe sex practices discussed with patient. Condoms offered.                             Physical Exam  General:  well-developed, well-nourished, and well-hydrated.   Eyes:  pupils equal, pupils round, and pupils reactive to light.   Mouth:  pharynx pink and moist and no exudates.   Neck:  no masses.   Lungs:  normal respiratory effort and normal breath sounds.   Heart:  normal rate, regular rhythm, and no murmur.   Abdomen:  soft, non-tender, and normal bowel sounds.   Extremities:  indurated, non-pitting edema.   Impression & Recommendations:  Problem # 1:  AIDS (ICD-042) will restart his bactrim, as he had run out of refills. will change his PI to isentress so that he can start taxol. have him back in 1 month.  His updated medication list for  this problem includes:    Azithromycin 250 Mg Tabs (Azithromycin) .Marland Kitchen... Take 5 tablets by mouth on mondays    Bicillin L-a 1200000 Unit/11ml Susp (Penicillin g benzathine) .Marland Kitchen... Administer im 1.2 milliion units in each buttock one week apart for 2 weeks    Bactrim Ds 800-160 Mg Tabs (Sulfamethoxazole-trimethoprim) .Marland Kitchen... 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 given this and it's interaction with PI's and NNRTI's will change him to isentress. will ahve him back in 1-2 months to reassess. reiterated his need to /fu with H/O my great appreciation to Dr Park Breed.   Medications Added to  Medication List This Visit: 1)  Isentress 400 Mg Tabs (Raltegravir potassium) .... Take 1 tablet by mouth two times a day Prescriptions: BACTRIM DS 800-160 MG TABS (SULFAMETHOXAZOLE-TRIMETHOPRIM) Take one tablet by mouth on Mondays, Wednesdays and Fridays  #30 x 3   Entered and Authorized by:   Johny Sax MD   Signed by:   Johny Sax MD on 11/23/2009   Method used:   Print then Give to Patient   RxID:   [id number] ISENTRESS 400 MG TABS (RALTEGRAVIR POTASSIUM) Take 1 tablet by mouth two times a day  #60 x 3   Entered and Authorized by:   Johny Sax MD   Signed by:   Johny Sax MD on 11/23/2009   Method used:   Print then Give to Patient   RxID:   [id number]   Appended Document: Orders Update    Clinical Lists Changes  Orders: Added new Service order of Est. Patient Level IV (84696) - Signed

## 2010-08-10 NOTE — Consult Note (Signed)
[facility]: [facility]   Imported By: Florinda Marker 02/18/2010 10:36:15  _____________________________________________________________________  External Attachment:    Type:   Image     Comment:   External Document

## 2010-08-10 NOTE — Consult Note (Signed)
Summary: [facility]   Imported By: Florinda Marker 12/28/2009 16:13:18  _____________________________________________________________________  External Attachment:    Type:   Image     Comment:   External Document

## 2010-08-10 NOTE — Consult Note (Signed)
Summary: [facility]   Imported By: Florinda Marker 04/07/2010 11:22:04  _____________________________________________________________________  External Attachment:    Type:   Image     Comment:   External Document

## 2010-08-10 NOTE — Consult Note (Signed)
Summary: [facility]  [facility]   Imported By: Florinda Marker 06/09/2010 15:59:54  _____________________________________________________________________  External Attachment:    Type:   Image     Comment:   External Document

## 2010-08-10 NOTE — Consult Note (Signed)
Summary: [facility]   Imported By: Florinda Marker 12/10/2009 15:11:56  _____________________________________________________________________  External Attachment:    Type:   Image     Comment:   External Document

## 2010-08-10 NOTE — Consult Note (Signed)
Summary: [facility]   Imported By: Florinda Marker 12/28/2009 15:58:58  _____________________________________________________________________  External Attachment:    Type:   Image     Comment:   External Document

## 2010-08-10 NOTE — Assessment & Plan Note (Signed)
Summary: F/U/VS   CC:  1 month follow up.  History of Present Illness: 30 yo M with Sept 2010 adm to [facility] with severe RLE swelling and cellulitis. He was also found on CT to have lesions on his spine. A biopsy showed KS. HIs CD4 was <10  and his VL was 31,000. Of note, his genotype showed a NNRTI resistant virus. He was started on atripla initially and then changed to atazanavir, norvir and truvada. 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 It was initially thought that his KS coul dbe improved with ART alone but after further consideration due to the extensiveness of his illness, he was started on Doxil and has recieved 6 cycles. His f/u scans have showed continued spinal lesions, some improvement in abd lesions.   He was changed to ISN/TRV at his visit 11-20-09 due to NNRTI and PI interactions with Taxol, his CTX for his KS. no problems with meds. has lost his hair due to CTX.  CD4 90 and VL <48 (11-10-09).   Preventive Screening-Counseling & Management  Alcohol-Tobacco     Alcohol drinks/day: occassionally     Alcohol type: mixed drink     Smoking Status: never  Caffeine-Diet-Exercise     Caffeine use/day: coffee,tea and sodas occassionally     Does Patient Exercise: yes     Type of exercise: stretches     Exercise (avg: min/session): 30-60     Times/week: 7  Safety-Violence-Falls     Seat Belt Use: yes  Current Medications (verified): 1)  Truvada 200-300 Mg Tabs (Emtricitabine-Tenofovir) .... Take 1 Tablet By Mouth Once A Day 2)  Azithromycin 250 Mg Tabs (Azithromycin) .... Take 5 Tablets By Mouth On Mondays 3)  Clonidine Hcl 0.1 Mg Tabs (Clonidine Hcl) .... Take 1 Tablet By Mouth Two Times A Day 4)  Eucerin  Crea (Skin Protectants, Misc.) .... As Needed 5)  Ferrous Gluconate 325 Mg Tabs (Ferrous Gluconate) .... Take 1 Tablet By Mouth Once A Day in Morning 6)  Zofran 4 Mg Tabs (Ondansetron Hcl) .... Take 2 Tablets By Mouth Once A Day As Needed  Nausea/vomitting 7)  Bactrim Ds 800-160 Mg Tabs (Sulfamethoxazole-Trimethoprim) .... Take One Tablet By Mouth On Mondays, Wednesdays and Fridays 8)  Multivitamins  Tabs (Multiple Vitamin) .... Take 1 Tablet By Mouth Once A Day 9)  Isentress 400 Mg Tabs (Raltegravir Potassium) .... Take 1 Tablet By Mouth Two Times A Day  Allergies (verified): 1)  ! Asa 2)  ! Pcn 3)  ! * Shellfish    Updated Prior Medication List: TRUVADA 200-300 MG TABS (EMTRICITABINE-TENOFOVIR) Take 1 tablet by mouth once a day AZITHROMYCIN 250 MG TABS (AZITHROMYCIN) Take 5 tablets by mouth on Mondays CLONIDINE HCL 0.1 MG TABS (CLONIDINE HCL) Take 1 tablet by mouth two times a day EUCERIN  CREA (SKIN PROTECTANTS, MISC.) as needed FERROUS GLUCONATE 325 MG TABS (FERROUS GLUCONATE) Take 1 tablet by mouth once a day in morning ZOFRAN 4 MG TABS (ONDANSETRON HCL) Take 2 tablets by mouth once a day as needed nausea/vomitting BACTRIM DS 800-160 MG TABS (SULFAMETHOXAZOLE-TRIMETHOPRIM) Take one tablet by mouth on Mondays, Wednesdays and Fridays MULTIVITAMINS  TABS (MULTIPLE VITAMIN) Take 1 tablet by mouth once a day ISENTRESS 400 MG TABS (RALTEGRAVIR POTASSIUM) Take 1 tablet by mouth two times a day  Current Allergies (reviewed today): ! ASA ! PCN ! * SHELLFISH Review of Systems       wt down 6#. his R  leg has decreased in size. no dysphagia.   Vital Signs:  Patient profile:   30 year old male Height:      74 inches (187.96 cm) Weight:      259.8 pounds (118.09 kg) BMI:     33.48 Temp:     98.6 degrees F (37.00 degrees C) oral Pulse rate:   84 / minute BP sitting:   144 / 88  (right arm)  Vitals Entered By: Baxter Hire) (December 23, 2009 9:27 AM) CC: 1 month follow up Pain Assessment Patient in pain? no      Nutritional Status Detail appetite is better per patient  Have you ever been in a relationship where you felt threatened, hurt or afraid?No   Does patient need assistance? Functional Status Self  care Ambulation Normal        Medication Adherence: 12/23/2009   Adherence to medications reviewed with patient. Counseling to provide adequate adherence provided   Prevention For Positives: 12/23/2009   Safe sex practices discussed with patient. Condoms offered.                             Physical Exam  General:  well-developed, well-nourished, and well-hydrated.   Eyes:  pupils equal, pupils round, and pupils reactive to light.   Mouth:  pharynx pink and moist and no exudates.   Neck:  no masses.   Lungs:  normal respiratory effort and normal breath sounds.   Heart:  normal rate, regular rhythm, and no murmur.   Abdomen:  soft, non-tender, and normal bowel sounds.   Extremities:  RLE- non-pitting edema. firm.    Impression & Recommendations:  Problem # 1:  AIDS (ICD-042) he is doing well on new art. will cont this and his OI meds.  will check his CD4 and VL today to assure that he is still suppressed. he is offered condoms.  return to clinic 3-4 months.  The following medications were removed from the medication list:    Bicillin L-a 1200000 Unit/57ml Susp (Penicillin g benzathine) .Marland Kitchen... Administer im 1.2 milliion units in each buttock one week apart for 2 weeks His updated medication list for this problem includes:    Azithromycin 250 Mg Tabs (Azithromycin) .Marland Kitchen... Take 5 tablets by mouth on mondays    Bactrim Ds 800-160 Mg Tabs (Sulfamethoxazole-trimethoprim) .Marland Kitchen... Take one tablet by mouth on mondays, wednesdays and fridays  Problem # 2:  HYPERTENSION (ICD-401.9) slightly increased today. will cont to watch.  His updated medication list for this problem includes:    Clonidine Hcl 0.1 Mg Tabs (Clonidine hcl) .Marland Kitchen... 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 Greatly appreciate partnering with her.   Other Orders: Est. Patient Level IV (16109) T-HIV Viral Load  [phone]) T-CD4SP ([facility]) (CD4SP)

## 2010-08-10 NOTE — Miscellaneous (Signed)
Summary: Hospital Admission  INTERNAL MEDICINE ADMISSION HISTORY AND PHYSICAL  *** please place in Progress Notes section of chart***  Attending: Dr. Aundria Rud 1st contact: Dr. Loistine Chance [phone] 2nd contact: Dr. Aldine Contes [phone]  Weekends, holidays, and after 5pm weekdays: 1st contact: [phone] 2nd contact: [phone]  PCP: Dr. Ninetta Lights  CC: bilateral SDH with midline shift  HPI: [name] is a 30 y/o with PMH of AIDS (currently not on ART), Kaposi's sarcoma (currently undergoing chemo) who presents with a 1.5-2 week hx of dizziness, headache, and difficulty walking.  He reported this to Dr. Welton Flakes at his last hem/onc appt; Dr. Welton Flakes ordered an MRI on 03/02/10 for evaluation of pts symptoms.  The study revealed a large right sided SDH with 13mm midline shift as well as a small left sided SDH.  Pt was contacted re: these results and presented to the ER for further evaluation and tx.  Pt is still experiencing dizziness, headache, and problems with gait and feels these symptoms remain unchanged.  He has not developed any new symptoms and denies visual changes/loss, focal weakness, tingling/numbness, or falls at home.  He also denies fever, chills, night sweats, weight loss, abdominal pain, N/V/D, chest pain and SOB.  He has no other concerns or complaints.  Regarding his ART, pt was changed to Isentress + Truvada at his visit  on 11-20-09 due to NNRTI and PI interactions with Taxol.  At that tine, his CD4 was 90 with undetectable VL.  He was hopitalized from 6/21 - 01/06/10 for CAP duing which time pt developed acute renal failure.  His ART was stopped re: concern for possible tenofovir (component of Truvada) renal toxicity. He has not been able to f/u with ID and not resumed ART or prophylactic meds.  He has no other concerns or complaints.  ALLERGIES: ASA: rash ! PCN: rash, sob ! 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  - [facility] 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 [employer] insurance   FAMILY HISTORY  mother deceased from pancreatic cancer   ROS: as per HPI, all other systems reviewed and negative   VITALS: T: 98  P: 76  BP: 123/63  R: 16  O2SAT: 98%  ON: RA  PHYSICAL EXAM: General:  alert, well-developed, NAD, cooperative, A&Ox3 Head:  normocephalic and atraumatic.   Eyes:  PERRLA, EOMI, vision grossly intact, conjuctive and sclerae within normal limits.   Mouth:  MMM, no erythema, no exudates.  Multiple lesions/erosions present in oropharynx.  Neck:  supple, full ROM, trachea midline, no palp masses, no JVD, no carotid bruits.   Lungs:  CTAB, normal respiratory effort  Heart: RRR, no M/R/G Abdomen:  soft, NT, ND, BS present and normoactive, no palpable masses  Msk:  no joint swelling, warmth, or erythema  Neurologic:  CN II-XII intact,+5 strength globally, sensation grossly intact, gait normal. Cerebellar function intact. Tremor present. Psych: memory intact for recent and remote, normally interactive, good eye contact, affect as expected.  Pt somewhat anxious.  LABS: WBC                                      7.6               4.0-10.5         K/uL  RBC                                      5.02              4.22-5.81        MIL/uL  Hemoglobin (HGB)                         12.3       l      13.0-17.0        g/dL  Hematocrit (HCT)                         38.1       l      39.0-52.0        %  MCV                                      75.9       l       78.0-100.0       fL  MCH -                                    24.5       l      26.0-34.0        pg  MCHC                                     32.3              30.0-36.0        g/dL  RDW                                      18.7       h      11.5-15.5        %  Sodium (NA)                              140               135-145          mEq/L  Potassium (K)                            3.6               3.5-5.1          mEq/L  Chloride  109               96-112           mEq/L  CO2                                      26                19-32            mEq/L  Glucose                                  102        h      70-99            mg/dL  BUN                                      8                 6-23             mg/dL  Creatinine                               1.16              0.4-1.5          mg/dL  GFR, Est Non African American            >60               >60              mL/min  GFR, Est African American                >60               >60              mL/min    Oversized comment, see footnote  1  Bilirubin, Total                         1.3        h      0.3-1.2          mg/dL  Alkaline Phosphatase                     104               39-117           U/L  SGOT (AST)                               16                0-37             U/L  SGPT (ALT)                               14  0-53             U/L  Total  Protein                           7.1               6.0-8.3          g/dL  Albumin-Blood                            4.1               3.5-5.2          g/dL  Calcium                                  9.3               8.4-10.5         mg/dL   UA: pendind CD4: pending  MRI head w and w/o contrast 03/02/10: 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  There is a smaller chronic subdural hematoma on the left.  There is mass effect on the midbrain due to the right-sidedsubdural hematoma.  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  Chronic sinusitis  MRI left hip w/o contrast 03/02/10:  Diffuse replacement of normal fatty marrow, likely representing reactivation red marrow in association with chronic disease.  Myelofibrosis, lymphoma or hematologic malignancy can produce a similar appearance.  This degree of replacement of normal fatty marrow with metastatic Kaposi sarcoma seems unlikely.  Superimposed focal lesions are present compatible with known history of metastatic Kaposi sarcoma.   ASSESSMENT AND PLAN: (1) Bilateral subdural hematoma Neurosurgery was consulted and will evaluate the patient for possible surgical intervention for tx of pts SDs. - Will check coags - Keep NPO until NSGY eval - Will admit to SDU - Neuro checks q4 - F/U NSGY recs  (2) AIDS Discussed case with Dr. Daiva Eves.  Review of hospitalization from 12/2009 reveals ARF that developed after admission (initial renal function was normal) without any evidence of acidosis that would be expected of tenofovir associated renal failure (tenofovir causes RTA).  Addionally, pt received vancomycin as part initial empiric tx of his PNA which could also explain the ARF.  Pt will greatly benefit from ART.   - As he was well controlled on his regimen of ISN/TRV, will resume with regimen without any changes.   - Will repeat VL and CD4.   - Will begin prophylacti tx for PCP with daily Bactrim DS (this will also provide toxo ppx; no need to check serologies at this point) - Will also begin q weekly Azitho for MAC prophylaxis, despite last CD4 of 60.  If his CD4 returns >50, would consider d/c azithro at d/c.   (3)Kaposi sarcoma Pt currently undergoing chemo with taxol for tx. Would discuss any management with Hem/onc if needed during this admission.   (4)VTE PROPH: SCDs

## 2010-08-10 NOTE — Consult Note (Signed)
[facility]: [facility]   Imported By: Florinda Marker 07/29/2009 15:43:32  _____________________________________________________________________  External Attachment:    Type:   Image     Comment:   External Document

## 2010-08-10 NOTE — Consult Note (Signed)
Summary: [facility]   Imported By: Florinda Marker 03/01/2010 16:13:42  _____________________________________________________________________  External Attachment:    Type:   Image     Comment:   External Document

## 2010-08-10 NOTE — Consult Note (Signed)
Summary: [facility]   Imported By: Florinda Marker 04/01/2010 16:34:16  _____________________________________________________________________  External Attachment:    Type:   Image     Comment:   External Document

## 2010-08-10 NOTE — Letter (Signed)
Summary: [facility]  [facility]   Imported By: Margie Billet 10/08/2009 15:58:45  _____________________________________________________________________  External Attachment:    Type:   Image     Comment:   External Document

## 2010-08-10 NOTE — Consult Note (Signed)
Summary: [facility]   Imported By: Florinda Marker 07/22/2009 15:45:08  _____________________________________________________________________  External Attachment:    Type:   Image     Comment:   External Document

## 2010-08-10 NOTE — Assessment & Plan Note (Signed)
Summary: [facility] Chart/kdw   CC:  [facility]  History of Present Illness: 30 yo M with Sept 2010 adm to [facility] with severe RLE swelling and cellulitis. He was also found on CT to have lesions on his spine. A biopsy showed KS. HIs CD4 was <10  and his VL was 31,000. Of note, his genotype showed a NNRTI resistant virus. He was started on atripla initially and then changed to atazanavir, norvir and truvada. 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 It was initially thought that his KS could be improved with ART alone but after further consideration due to the extensiveness of his illness, he was started on Doxil and has recieved 6 cycles. His f/u scans have showed continued spinal lesions, some improvement in abd lesions.   He was changed to ISN/TRV at his visit 11-20-09 due to NNRTI and PI interactions with Taxol. CD4 90 and VL <48 (11-10-09).  He was hospitalized 6-21 to 6-29 with pneuomonia and also found to have new renal failure. His ART was stopped (for concnern that his TFV was th culprit). He had HLA genotyping which did not show HLA-[id number].  has ccas cough now but feels much better.   Preventive Screening-Counseling & Management  Alcohol-Tobacco     Alcohol drinks/day: occassionally     Alcohol type: mixed drink     Smoking Status: never  Caffeine-Diet-Exercise     Caffeine use/day: 0     Does Patient Exercise: yes     Type of exercise: stretches     Exercise (avg: min/session): 30-60     Times/week: 7  Safety-Violence-Falls     Seat Belt Use: yes   Updated Prior Medication List: EUCERIN  CREA (SKIN PROTECTANTS, MISC.) as needed BACTRIM DS 800-160 MG TABS (SULFAMETHOXAZOLE-TRIMETHOPRIM) Take one tablet by mouth on Mondays, Wednesdays and Fridays CLOTRIMAZOLE 10 MG LOZG (CLOTRIMAZOLE) Suck on over 20 minutes 5 times a day for 7-14 days  Current Allergies (reviewed today): ! ASA ! PCN ! * SHELLFISH Past  History:  Past medical, surgical, family and social histories (including risk factors) reviewed, and no changes noted (except as noted below).  Past Medical History: Reviewed history from 04/13/2009 and no changes required. HIV+ 03-23-09    Genotype Y181C Kaposi's Sarcoma Hypertension Syphillis (1:2 03-23-09)  Family History: Reviewed history from 04/13/2009 and no changes required. mother deceased with pancreatic cancer  Social History: Reviewed history from 04/13/2009 and no changes required. Single Never Smoked Alcohol use-no Drug use-no  Review of Systems       lost 25#, apetite is slowly improving. LE swelling is a little better.   Vital Signs:  Patient profile:   30 year old male Height:      74 inches (187.96 cm) Weight:      234.0 pounds (106.36 kg) BMI:     30.15 O2 Sat:      96 % on Room air Temp:     99.2 degrees F (37.33 degrees C) oral Pulse rate:   97 / minute BP sitting:   125 / 81  (left arm)  Vitals Entered By: Baxter Hire) (January 27, 2010 9:37 AM)  O2 Flow:  Room air CC: [facility] Pain Assessment Patient in pain? no      Nutritional Status BMI of > 30 = obese Nutritional Status Detail appetite is getting better per patient  Does patient need assistance? Functional Status Self care Ambulation  Normal   Physical Exam  General:  alert, well-developed, well-nourished, and well-hydrated.   Eyes:  pupils equal, pupils round, and pupils reactive to light.   Mouth:  pharynx pink and moist and no exudates.   Neck:  no masses.   Lungs:  normal respiratory effort and normal breath sounds.   Heart:  normal rate, regular rhythm, and no murmur.   Abdomen:  soft, non-tender, and normal bowel sounds.   Extremities:  left pretibial edema and right pretibial edema.          Medication Adherence: 01/27/2010   Adherence to medications reviewed with patient. Counseling to provide adequate adherence provided   Prevention For Positives:  01/27/2010   Safe sex practices discussed with patient. Condoms offered.                             Impression & Recommendations:  Problem # 1:  AIDS (ICD-042) he is cnvinced that his ART was not the cause of his ARF. his Cr did return to NL in hospital which would make TFV less likely as the culprit. will restart his previus medicines, get his labs from Dr Park Breed (from yesterday and in 2 weeks). have him return to clinic 2 months. at f/u needs all his serologies imported.  The following medications were removed from the medication list:    Azithromycin 250 Mg Tabs (Azithromycin) .Marland Kitchen... Take 5 tablets by mouth on mondays His updated medication list for this problem includes:    Bactrim Ds 800-160 Mg Tabs (Sulfamethoxazole-trimethoprim) .Marland Kitchen... Take one tablet by mouth on mondays, wednesdays and fridays    Clotrimazole 10 Mg Lozg (Clotrimazole) ..... Suck on over 20 minutes 5 times a day for 7-14 days  Problem # 2:  RENAL INSUFFICIENCY (ICD-588.9) Cr done at Dr Santo Held office (1.1).  will have repeat 02-09-10 and have asked them to fax [place] the result.   Problem # 3:  KAPOSI'S SARCOMA, METASTATIC (ICD-176.9) my great appreciation to Dr Park Breed in partnering with [place] in the care of this pt.   Other Orders: Est. Patient Level IV (98119)   Prevention & Chronic Care Immunizations   Influenza vaccine: Not documented    Tetanus booster: Not documented    Pneumococcal vaccine: Not documented  Other Screening   Smoking status: never  (01/27/2010)  Hypertension   Last Blood Pressure: 125 / 81  (01/27/2010)   Serum creatinine: 1.17  (11/10/2009)   Serum potassium 4.2  (11/10/2009)  Self-Management Support :    Hypertension self-management support: Not documented

## 2010-08-10 NOTE — Miscellaneous (Signed)
Summary: Lab Updates from E-chart  Clinical Lists Changes  Observations: Added new observation of T-HELPER %: 6 % (01/22/2010 14:31) Added new observation of CD4 COUNT: 60 microliters (01/22/2010 14:31) Added new observation of PLATELETK/UL: 199 K/uL (01/06/2010 14:30) Added new observation of WBC COUNT: 30.3 10*3/microliter (01/06/2010 14:30) Added new observation of HGB: 11.2 g/dL (16/04/9603 54:09) Added new observation of CREATININE: 1.31 mg/dL (81/19/1478 29:56) Added new observation of GLUCOSE SER: 140 mg/dL (21/30/8657 84:69) Added new observation of PLATELETK/UL: 185 K/uL (01/01/2010 14:34) Added new observation of WBC COUNT: 16.6 10*3/microliter (01/01/2010 14:34) Added new observation of HGB: 11.2 g/dL (62/95/2841 32:44)     -  Date:  01/22/2010    CD4: 60    CD4%: 6  Date:  01/06/2010    Glucose: 140    Creatinine: 1.31    Hemoglobin: 11.2    WBC: 30.3    Platelets: 199  Date:  01/01/2010    Hemoglobin: 11.2    WBC: 16.6    Platelets: 185

## 2010-08-10 NOTE — Consult Note (Signed)
Summary: [facility]@ [facility]   Imported By: Florinda Marker 07/29/2009 15:44:24  _____________________________________________________________________  External Attachment:    Type:   Image     Comment:   External Document

## 2010-08-10 NOTE — Discharge Summary (Signed)
Summary: Hospital Discharge Update    Hospital Discharge Update:  Date of Admission: 12/29/2009 Date of Discharge: 01/06/2010  Brief Summary:  Patinet was admitted with new onset shortness of breath, cough, fevrs and chills. Patient was already on avelox for several days prior to admission and was started on Vanc and Cipro for HCAP. Patient is on treatment for his KS by Dr Welton Flakes. He improved significantly with above mentioned antibiotics but Hospital course was prolonged by severe symptomatic cough and chest pain due to cough.  Lab or other results pending at discharge:  HLA-B 5701  Other follow-up issues:  Patient has a thrush while in the hospital and this is something which needs to be assesed on follow up along with antibiotic completion. Patient also needs to be restarted on his HIV meds according to Dr Ninetta Lights.  Problem list changes:  Added new problem of BACTERIAL PNEUMONIA (ICD-482.9) Added new problem of RENAL INSUFFICIENCY (ICD-588.9)  Medication list changes:  Removed medication of TRUVADA 200-300 MG TABS (EMTRICITABINE-TENOFOVIR) Take 1 tablet by mouth once a day Removed medication of CLONIDINE HCL 0.1 MG TABS (CLONIDINE HCL) Take 1 tablet by mouth two times a day Removed medication of ZOFRAN 4 MG TABS (ONDANSETRON HCL) Take 2 tablets by mouth once a day as needed nausea/vomitting Removed medication of ISENTRESS 400 MG TABS (RALTEGRAVIR POTASSIUM) Take 1 tablet by mouth two times a day Removed medication of FERROUS GLUCONATE 325 MG TABS (FERROUS GLUCONATE) Take 1 tablet by mouth once a day in morning Removed medication of MULTIVITAMINS  TABS (MULTIPLE VITAMIN) Take 1 tablet by mouth once a day Added new medication of CLOTRIMAZOLE 10 MG LOZG (CLOTRIMAZOLE) Suck on over 20 minutes 5 times a day for 7-14 days - Signed Added new medication of DOXYCYCLINE HYCLATE 100 MG TABS (DOXYCYCLINE HYCLATE) 1 tab two times a day by mouth - Signed Added new medication of HYDROCOD  POLST-CHLORPHEN POLST 10-8 MG/5ML LQCR (CHLORPHENIRAMINE-HYDROCODONE) 1-2 teaspoons every 6 hours for cough as needed - Signed Rx of CLOTRIMAZOLE 10 MG LOZG (CLOTRIMAZOLE) Suck on over 20 minutes 5 times a day for 7-14 days;  #60 x 1;  Signed;  Entered by: Lars Mage MD;  Authorized by: Lars Mage MD;  Method used: Print then Give to Patient Rx of HYDROCOD POLST-CHLORPHEN POLST 10-8 MG/5ML LQCR (CHLORPHENIRAMINE-HYDROCODONE) 1-2 teaspoons every 6 hours for cough as needed;  #1 x 1;  Signed;  Entered by: Lars Mage MD;  Authorized by: Lars Mage MD;  Method used: Print then Give to Patient Rx of DOXYCYCLINE HYCLATE 100 MG TABS (DOXYCYCLINE HYCLATE) 1 tab two times a day by mouth;  #12 x 0;  Signed;  Entered by: Lars Mage MD;  Authorized by: Lars Mage MD;  Method used: Print then Give to Patient  The medication, problem, and allergy lists have been updated.  Please see the dictated discharge summary for details.  Discharge medications:  AZITHROMYCIN 250 MG TABS (AZITHROMYCIN) Take 5 tablets by mouth on Mondays EUCERIN  CREA (SKIN PROTECTANTS, MISC.) as needed BACTRIM DS 800-160 MG TABS (SULFAMETHOXAZOLE-TRIMETHOPRIM) Take one tablet by mouth on Mondays, Wednesdays and Fridays CLOTRIMAZOLE 10 MG LOZG (CLOTRIMAZOLE) Suck on over 20 minutes 5 times a day for 7-14 days DOXYCYCLINE HYCLATE 100 MG TABS (DOXYCYCLINE HYCLATE) 1 tab two times a day by mouth HYDROCOD POLST-CHLORPHEN POLST 10-8 MG/5ML LQCR (CHLORPHENIRAMINE-HYDROCODONE) 1-2 teaspoons every 6 hours for cough as needed  Other patient instructions:  Patient has a follow up appointment on July 5th 2011 at 2 PM with  Dr Ninetta Lights at [facility] infectious disease center. Please complete your antibiotics for next 6 days and then stop. If you have fevers, chills and/or feel worse, you should call up ID clinic at [phone].  Note: Hospital Discharge Medications & Other Instructions handout was printed, one copy for patient and a second copy to be placed  in hospital chart.

## 2010-08-10 NOTE — Miscellaneous (Signed)
Summary: 042 labs  Clinical Lists Changes  Orders: Added new Test order of T-CBC w/Diff [phone]) - Signed Added new Test order of T-CD4SP [facility]) (CD4SP) - Signed Added new Test order of T-Comprehensive Metabolic Panel [phone]) - Signed Added new Test order of T-HIV Viral Load [phone]) - Signed

## 2010-08-10 NOTE — Consult Note (Signed)
[facility]   Imported By: Florinda Marker 05/24/2010 15:59:12  _____________________________________________________________________  External Attachment:    Type:   Image     Comment:   External Document

## 2010-08-10 NOTE — Consult Note (Signed)
[facility]: [facility]   Imported By: Florinda Marker 01/07/2010 15:25:21  _____________________________________________________________________  External Attachment:    Type:   Image     Comment:   External Document

## 2010-08-10 NOTE — Consult Note (Signed)
Summary: [facility]   Imported By: Florinda Marker 09/30/2009 10:18:00  _____________________________________________________________________  External Attachment:    Type:   Image     Comment:   External Document

## 2010-08-10 NOTE — Consult Note (Signed)
Summary: [facility]   Imported By: Florinda Marker 09/30/2009 10:18:34  _____________________________________________________________________  External Attachment:    Type:   Image     Comment:   External Document

## 2010-08-10 NOTE — Miscellaneous (Signed)
Summary: Hospital Admission  INTERNAL MEDICINE ADMISSION HISTORY AND PHYSICAL  PCP: Dr. Ninetta Lights  CC: Cough  HPI: 30 yr old man with pmhx of HIV/AIDS with CD4 60 comes to the hospital complianing of cough. Reports that symptoms started as sinusitis about a week and a half ago but then about a week ago he started to have productive cough of yellow to whitish sputum. Associated with shortness of breath, fever/chills, generalized weakness. Denies chest pain, palpitations, diaphoresis, abdominal pain, diarrhea, blurry vision, or headache.   ALLERGIES: ! ASA ! PCN ! 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 Hypertension Syphillis (1:2 03-23-09)  MEDICATIONS: TRUVADA 200-300 MG TABS (EMTRICITABINE-TENOFOVIR) Take 1 tablet by mouth once a day AZITHROMYCIN 250 MG TABS (AZITHROMYCIN) Take 5 tablets by mouth on Mondays CLONIDINE HCL 0.1 MG TABS (CLONIDINE HCL) Take 1 tablet by mouth two times a day EUCERIN  CREA (SKIN PROTECTANTS, MISC.) as needed FERROUS GLUCONATE 325 MG TABS (FERROUS GLUCONATE) Take 1 tablet by mouth once a day in morning ZOFRAN 4 MG TABS (ONDANSETRON HCL) Take 2 tablets by mouth once a day as needed nausea/vomitting BACTRIM DS 800-160 MG TABS (SULFAMETHOXAZOLE-TRIMETHOPRIM) Take one tablet by mouth on Mondays, Wednesdays and Fridays MULTIVITAMINS  TABS (MULTIPLE VITAMIN) Take 1 tablet by mouth once a day ISENTRESS 400 MG TABS (RALTEGRAVIR POTASSIUM) Take 1 tablet by mouth two times a day   SOCIAL HISTORY: Single Never Smoked Alcohol use-no Drug use-no   FAMILY HISTORY Mother deceased with pancreatic cancer  ROS: Per HPI  VITALS: T: 100.6  P: 113  BP: 122/51 R: 20 O2SAT: 97 ON: ra  PHYSICAL EXAM: Gen: Patient is in NAD, Pleasant. Eyes: PERRL, EOMI, No signs of anemia or jaundince. ENT: MMM, OP clear, No erythema, thrush or exudates. Neck: Supple, No carotid Bruits, No JVD, No  thyromegaly Resp: CTA- Bilaterally, No W/C/R. CVS: S1S2 RRR, No M/R/G GI: Abdomen is soft. ND, NT, NG, NR, BS+. No organomegaly. Ext: No pedal edema, cyanosis or clubbing. GU: No CVA tenderness. Skin: No visible rashes, scars. Lymph: No palpable lymphadenopathy. MS: Moving all 4 extremities. Neuro: A&O X3, CN II - XII are grossly intact. Motor strength is 5/5 in the all 4 extremities, Sensations intact to light touch, Gait normal, Cerebellar signs negative. Psych: Appropriate   LABS: WBC                                      10.4              4.0-10.5         K/uL  RBC                                      5.12              4.22-5.81        MIL/uL  Hemoglobin (HGB)                         13.2              13.0-17.0        g/dL  Hematocrit (HCT)  40.5              39.0-52.0        %  MCV                                      79.0              78.0-100.0       fL  MCHC                                     32.5              30.0-36.0        g/dL  RDW                                      14.2              11.5-15.5        %  Platelet Count (PLT)                     194               150-400          K/uL  Neutrophils, %                           85         h      43-77            %  Lymphocytes, %                           7          l      12-46            %  Monocytes, %                             3                 3-12             %  Eosinophils, %                           5                 0-5              %  Basophils, %                             0                 0-1              %  Neutrophils, Absolute                    8.9        h      1.7-7.7  K/uL  Lymphocytes, Absolute                    0.8               0.7-4.0          K/uL  Monocytes, Absolute                      0.3               0.1-1.0          K/uL  Eosinophils, Absolute                    0.5               0.0-0.7          K/uL  Basophils, Absolute                      0.0                0.0-0.1          K/uL  Sodium (NA)                              134        l      135-145          mEq/L  Potassium (K)                            4.3               3.5-5.1          mEq/L  Chloride                                 101               96-112           mEq/L  CO2                                      30                19-32            mEq/L  Glucose                                  117        h      70-99            mg/dL  BUN                                      8                 6-23             mg/dL  Creatinine  1.19              0.4-1.5          mg/dL  GFR, Est Non African American            >60               >60              mL/min  GFR, Est African American                >60               >60              mL/min    Oversized comment, see footnote  1  Bilirubin, Total                         1.1               0.3-1.2          mg/dL  Alkaline Phosphatase                     147        h      39-117           U/L  SGOT (AST)                               24                0-37             U/L  SGPT (ALT)                               31                0-53             U/L  Total  Protein                           7.5               6.0-8.3          g/dL  Albumin-Blood                            3.9               3.5-5.2          g/dL  Calcium                                  9.1               8.4-10.5         mg/dL  Lactic Acid, Venous                      2.1               0.5-2.2          mmol/L  Color, Urine  AMBER      a      YELLOW    BIOCHEMICALS MAY BE AFFECTED BY COLOR  Appearance                               CLEAR             CLEAR  Specific Gravity                         1.023             1.005-1.030  pH                                       5.0               5.0-8.0  Urine Glucose                            NEGATIVE          NEG              mg/dL  Bilirubin                                SMALL      a       NEG  Ketones                                  NEGATIVE          NEG              mg/dL  Blood                                    NEGATIVE          NEG  Protein                                  NEGATIVE          NEG              mg/dL  Urobilinogen                             0.2               0.0-1.0          mg/dL  Nitrite                                  NEGATIVE          NEG  Leukocytes                               NEGATIVE          NEG  Procalcitonin                            <  0.50    Chest xray:  IMPRESSION:   Mild bibasilar airspace opacities may reflect the patient's   Kaposi's sarcoma, although underlying pneumonia cannot be excluded   given clinical concern.  ASSESSMENT AND PLAN: (1) Community Acquired PNA- Patient will be admited for close observation as he is immunocompromized with CD4 60. Will be started on Avelox, Rocephin, Zosyn. Will check respiratory cultures, strep pneumo antigen, urine legionella, serum cryptococcal antigen and culture for AFB and PCP. Review labs and titrate antibiotics per cultures. Check ABG if PO2 less than 70 will consider increasing Bactrim and adding steroids for possible PCP pneumonia.   2) HIV/AIDs- Restart antiretrovirals. Patient has a HIV RNA Quant 58. Continue bactrim and Azithromycin for PCP and MAC prophylaxis.  3) HTN- Hold clonidine for now.  4) Karposi's Sarcoma- s/p treatment with 6 cycles of Doxil and then 1 cycle of Taxol.  4) DVT ppx- Lovenox

## 2010-08-10 NOTE — Consult Note (Signed)
[facility]   Imported By: Florinda Marker 05/03/2010 12:05:34  _____________________________________________________________________  External Attachment:    Type:   Image     Comment:   External Document

## 2010-08-10 NOTE — Assessment & Plan Note (Signed)
Summary: REFROM 9/26 HFU [[facility]]   CC:  follow-up visit.  History of Present Illness: 30 yo M with Sept 2010 adm to [facility] with severe RLE swelling and cellulitis. He was also found on CT to have lesions on his spine. A biopsy showed KS. HIs CD4 was <10  and his VL was 31,000. Of note, his genotype showed a NNRTI resistant virus. He was started on atripla initially and then changed to atazanavir, norvir and truvada. 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 It was initially thought that his KS could be improved with ART alone but after further consideration due to the extensiveness of his illness, he was started on Doxil and has recieved 6 cycles. His f/u scans have showed continued spinal lesions, some improvement in abd lesions.   He was changed to ISN/TRV at his visit 11-20-09 due to NNRTI and PI interactions with Taxol. CD4 90 and VL <48 (11-10-09).  He was hospitalized 6-21 to 6-29 with pneuomonia and also found to have new renal failure. His ART was stopped (for concnern that his TFV was th culprit). He had HLA genotyping which did not show [id number].  Admitted 03-03-10 to 03-17-10 with bilateral subdural hematomas. He was initially eval in his H/O office for dizzyness, headache and gait problems. He underwent craniotomy and drainage of these 03-05-10 (R) and 03-12-10(L). 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 he also was found to have an elavated PTT and lupus anticoagulant. CD4 90. Headaches are beter. Walking well. taking meds well. Has appt to f/u with Dr Park Breed 04-07-10. wounds have healed well, has f/uw ith Dr Newell Coral 04-05-10. has some residual blood on L side which is being watched.  cont to have sinus problems. worse at night. clear nasal d/c. infrequent.   Preventive Screening-Counseling & Management  Alcohol-Tobacco     Alcohol drinks/day: occassionally     Alcohol type: mixed drink     Smoking Status:  never  Caffeine-Diet-Exercise     Caffeine use/day: 0     Does Patient Exercise: yes     Type of exercise: stretches     Exercise (avg: min/session): 30-60     Times/week: 7  Safety-Violence-Falls     Seat Belt Use: yes   Updated Prior Medication List: EUCERIN  CREA (SKIN PROTECTANTS, MISC.) as needed BACTRIM DS 800-160 MG TABS (SULFAMETHOXAZOLE-TRIMETHOPRIM) Take one tablet by mouth on Mondays, Wednesdays and Fridays CLOTRIMAZOLE 10 MG LOZG (CLOTRIMAZOLE) Suck on over 20 minutes 5 times a day for 7-14 days ISENTRESS 400 MG TABS (RALTEGRAVIR POTASSIUM) take one tab by mouth two times a day TRUVADA 200-300 MG TABS (EMTRICITABINE-TENOFOVIR) take one tab by mouth daily AZITHROMYCIN 600 MG TABS (AZITHROMYCIN) take 2 tab by mouth q7days  Current Allergies (reviewed today): ! ASA ! PCN ! * SHELLFISH Past History:  Past medical, surgical, family and social histories (including risk factors) reviewed, and no changes noted (except as noted below).  Past Medical History: Reviewed history from 04/13/2009 and no changes required. HIV+ 03-23-09    Genotype Y181C Kaposi's Sarcoma Hypertension Syphillis (1:2 03-23-09)  Family History: Reviewed history from 04/13/2009 and no changes required. mother deceased with pancreatic cancer  Social History: Reviewed history from 04/13/2009 and no changes required. Single Never Smoked Alcohol use-no Drug use-no  Review of Systems       wt down 7#, just tokk BP meds. can't remember name.  Vital Signs:  Patient profile:  30 year old male Height:      74 inches (187.96 cm) Weight:      227.8 pounds (103.55 kg) BMI:     29.35 Temp:     99.3 degrees F (37.39 degrees C) oral Pulse rate:   82 / minute BP sitting:   160 / 106  (right arm)  Vitals Entered By: Baxter Hire) (April 07, 2010 9:15 AM) CC: follow-up visit Pain Assessment Patient in pain? no      Nutritional Status BMI of 25 - 29 = overweight Nutritional Status  Detail appetite is fine per patient  Have you ever been in a relationship where you felt threatened, hurt or afraid?No   Does patient need assistance? Functional Status Self care Ambulation Normal   Physical Exam  General:  well-developed, well-nourished, and well-hydrated.   Head:  scars well healed, some residual scabbing/skin change on R Eyes:  pupils equal, pupils round, and pupils reactive to light.   Mouth:  pharynx pink and moist and no exudates.   Neck:  no masses.   Lungs:  normal respiratory effort and normal breath sounds.   Heart:  normal rate, regular rhythm, and no murmur.   Abdomen:  soft, non-tender, and normal bowel sounds.   Neurologic:  alert & oriented X3 and cranial nerves II-XII intact.  strenght and coordination NL in UE.         Medication Adherence: 04/07/2010   Adherence to medications reviewed with patient. Counseling to provide adequate adherence provided   Prevention For Positives: 04/07/2010   Safe sex practices discussed with patient. Condoms offered.                             Impression & Recommendations:  Problem # 1:  AIDS (ICD-042) he is doing fairly well. will give flu shot today and recheck his labs. not sexually active (been in hospital)! will see him back in 1-2 months.  His updated medication list for this problem includes:    Bactrim Ds 800-160 Mg Tabs (Sulfamethoxazole-trimethoprim) .Marland Kitchen... Take one tablet by mouth on mondays, wednesdays and fridays    Clotrimazole 10 Mg Lozg (Clotrimazole) ..... Suck on over 20 minutes 5 times a day for 7-14 days    Azithromycin 600 Mg Tabs (Azithromycin) .Marland Kitchen... 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 my great appreciation to her in partnering with [place].    Medications Added to Medication List This Visit: 1)  Azithromycin 600 Mg Tabs (Azithromycin) .... Take 2 tab by mouth q7days 2)  Claritin 10 Mg Tabs (Loratadine) .... Take 1 tablet by mouth once a day  Other Orders: Est. Patient Level IV ([address]) T-CD4SP ([facility]) (CD4SP) T-HIV Viral Load [phone]) T-Comprehensive Metabolic Panel [id number]) T-CBC w/Diff [phone])    Immunization History:  Influenza Immunization History:    Influenza:  fluvax mcr (04/07/2010)  Pneumovax Immunization History:    Pneumovax:  pneumovax (12/29/2009)  Appended Document: Orders Update    Clinical Lists Changes  Orders: Added new Service order of Influenza Vaccine NON MCR (96295) - Signed Observations: Added new observation of FLU VAX#1VIS: 02/02/10 version given April 07, 2010. (04/07/2010 12:55) Added new observation of FLU VAXLOT: 11033P (04/07/2010 12:55) Added new observation of FLU  VAX EXP: 10/10/2010 (04/07/2010 12:55) Added new observation of FLU VAXBY: Kathi Simpers [facility]) (04/07/2010 12:55) Added new observation of FLU VAXRTE: IM (04/07/2010 12:55) Added new observation of FLU VAX DSE: 0.5 ml (04/07/2010 12:55) Added new observation of FLU VAXMFR: Novartis (04/07/2010 12:55) Added new observation of FLU VAX SITE: right deltoid (04/07/2010 12:55) Added new observation of FLU VAX: Fluvax Non-MCR (04/07/2010 12:55)       Influenza Vaccine    Vaccine Type: Fluvax Non-MCR    Site: right deltoid    Mfr: Novartis    Dose: 0.5 ml    Route: IM    Given by: Kathi Simpers CMA(AAMA)    Exp. Date: 10/10/2010    Lot #: [id number]    VIS given: 02/02/10 version given April 07, 2010.  Flu Vaccine Consent Questions    Do you have a history of severe allergic reactions to this vaccine? no    Any prior history of allergic reactions to egg and/or gelatin? no    Do you have a sensitivity to the preservative Thimersol? no    Do you have a past history of Guillan-Barre Syndrome?  no    Do you currently have an acute febrile illness? no    Have you ever had a severe reaction to latex? no    Vaccine information given and explained to patient? yes

## 2010-08-12 NOTE — Consult Note (Signed)
Summary: [facility]  [facility]   Imported By: Florinda Marker 07/22/2010 09:47:59  _____________________________________________________________________  External Attachment:    Type:   Image     Comment:   External Document

## 2010-08-12 NOTE — Consult Note (Signed)
Summary: [facility]   Imported By: Florinda Marker 06/30/2010 16:27:31  _____________________________________________________________________  External Attachment:    Type:   Image     Comment:   External Document

## 2010-08-18 ENCOUNTER — Other Ambulatory Visit: Payer: Self-pay | Admitting: Oncology

## 2010-08-18 ENCOUNTER — Encounter (HOSPITAL_BASED_OUTPATIENT_CLINIC_OR_DEPARTMENT_OTHER): Payer: Self-pay | Admitting: Oncology

## 2010-08-18 DIAGNOSIS — B20 Human immunodeficiency virus [HIV] disease: Secondary | ICD-10-CM

## 2010-08-18 DIAGNOSIS — C469 Kaposi's sarcoma, unspecified: Secondary | ICD-10-CM

## 2010-08-18 DIAGNOSIS — D72819 Decreased white blood cell count, unspecified: Secondary | ICD-10-CM

## 2010-08-18 DIAGNOSIS — D709 Neutropenia, unspecified: Secondary | ICD-10-CM

## 2010-08-18 DIAGNOSIS — D6859 Other primary thrombophilia: Secondary | ICD-10-CM

## 2010-08-18 DIAGNOSIS — D649 Anemia, unspecified: Secondary | ICD-10-CM

## 2010-08-18 DIAGNOSIS — C467 Kaposi's sarcoma of other sites: Secondary | ICD-10-CM

## 2010-08-18 LAB — COMPREHENSIVE METABOLIC PANEL
ALT: 26 U/L (ref 0–53)
Albumin: 4.8 g/dL (ref 3.5–5.2)
Alkaline Phosphatase: 103 U/L (ref 39–117)
CO2: 24 mEq/L (ref 19–32)
Potassium: 3.9 mEq/L (ref 3.5–5.3)
Sodium: 138 mEq/L (ref 135–145)
Total Bilirubin: 1.6 mg/dL — ABNORMAL HIGH (ref 0.3–1.2)
Total Protein: 8.5 g/dL — ABNORMAL HIGH (ref 6.0–8.3)

## 2010-08-18 LAB — CBC WITH DIFFERENTIAL/PLATELET
BASO%: 0.3 % (ref 0.0–2.0)
Eosinophils Absolute: 0.2 10*3/uL (ref 0.0–0.5)
LYMPH%: 67.6 % — ABNORMAL HIGH (ref 14.0–49.0)
MCHC: 33.4 g/dL (ref 32.0–36.0)
MONO#: 0.2 10*3/uL (ref 0.1–0.9)
NEUT#: 0.6 10*3/uL — ABNORMAL LOW (ref 1.5–6.5)
Platelets: 131 10*3/uL — ABNORMAL LOW (ref 140–400)
RBC: 6.47 10*6/uL — ABNORMAL HIGH (ref 4.20–5.82)
RDW: 13.6 % (ref 11.0–14.6)
WBC: 2.9 10*3/uL — ABNORMAL LOW (ref 4.0–10.3)

## 2010-08-20 ENCOUNTER — Encounter (HOSPITAL_BASED_OUTPATIENT_CLINIC_OR_DEPARTMENT_OTHER): Payer: Self-pay | Admitting: Oncology

## 2010-08-20 DIAGNOSIS — C467 Kaposi's sarcoma of other sites: Secondary | ICD-10-CM

## 2010-08-20 DIAGNOSIS — Z452 Encounter for adjustment and management of vascular access device: Secondary | ICD-10-CM

## 2010-08-20 DIAGNOSIS — B20 Human immunodeficiency virus [HIV] disease: Secondary | ICD-10-CM

## 2010-08-28 ENCOUNTER — Encounter: Payer: Self-pay | Admitting: Infectious Diseases

## 2010-08-31 IMAGING — CT CT ABDOMEN W/ CM
2 of 4 series · 17 of 46 positions shown, 19 images · IV contrast (agent unspecified)
Comparison: None

CT ABDOMEN

CLINICAL DATA: Cellulitis

CT ABDOMEN WITH CONTRAST
TECHNIQUE: Multidetector CT imaging of the abdomen and pelvis was
performed using the standard protocol following bolus
administration of intravenous contrast.
Contrast: 119 ml Ymnipaque-NCC

[Series 2: abd/pelv with 5.0 b31f st · axial · 0.71mm/px · z∈[-290,+20]mm · 14 of 68 slices shown, 16 images]
[im 3/68  soft-tissue]
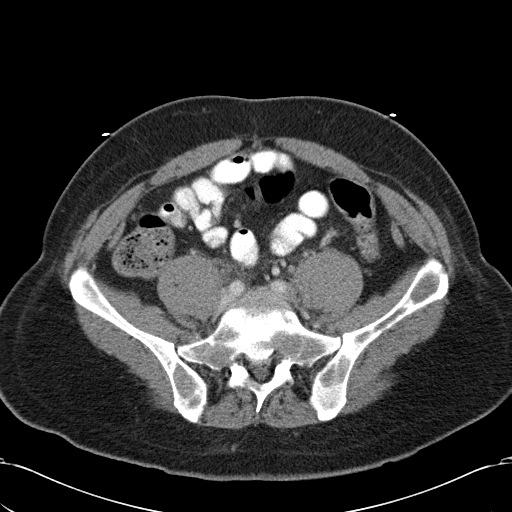
[im 3/68  bone]
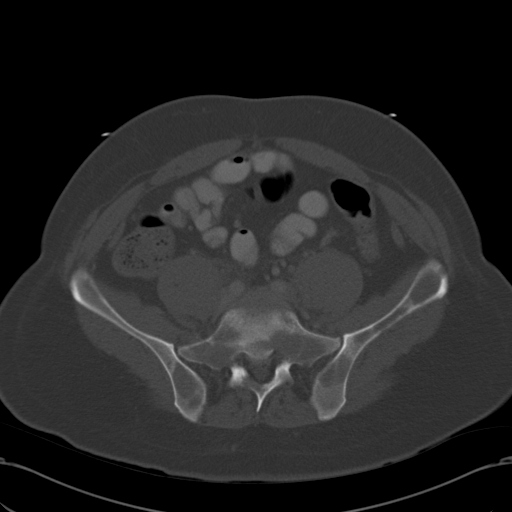
[im 9/68  soft-tissue]
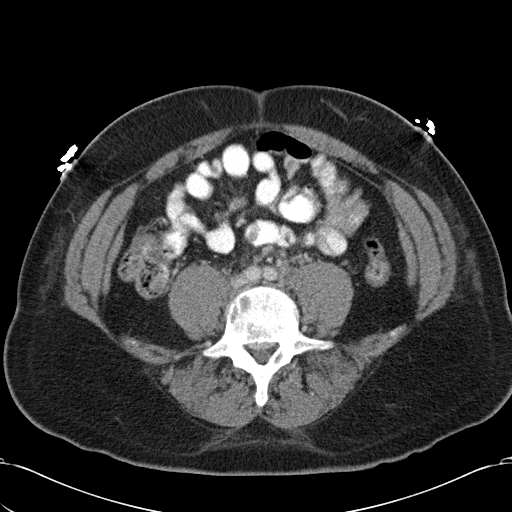
[im 12/68  soft-tissue]
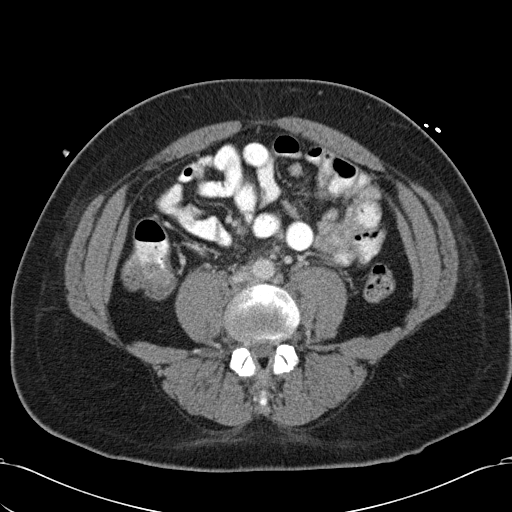
[im 18/68  soft-tissue]
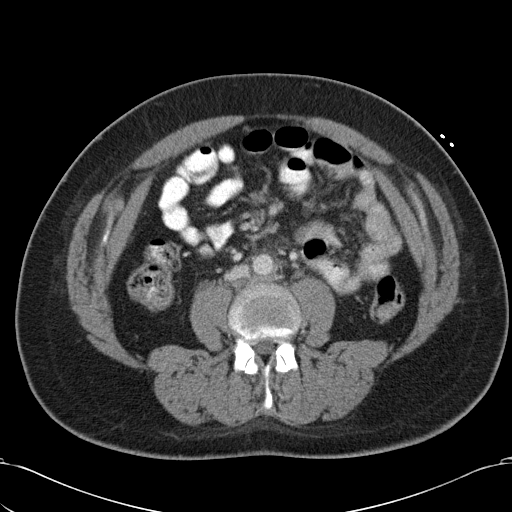
[im 24/68  soft-tissue]
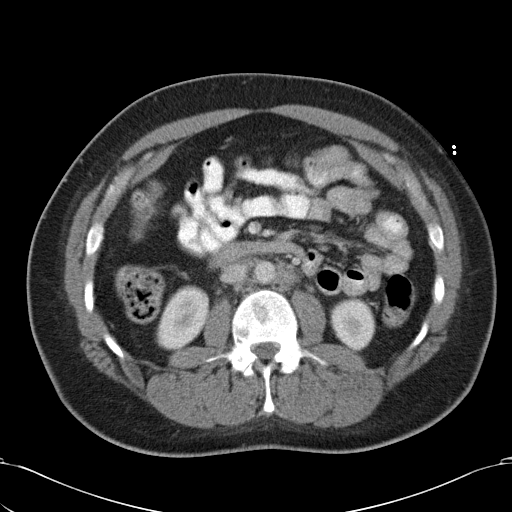
[im 27/68  soft-tissue]
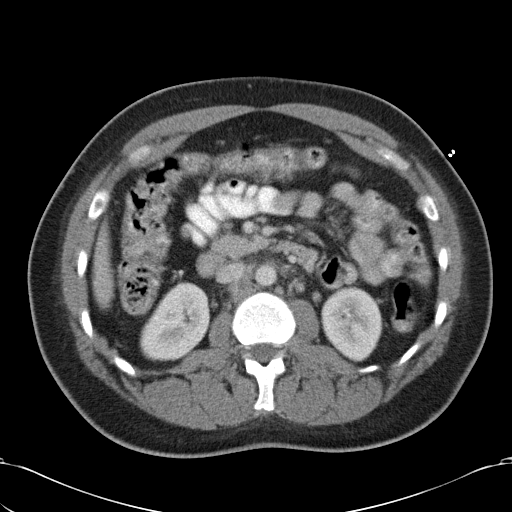
[im 33/68  soft-tissue]
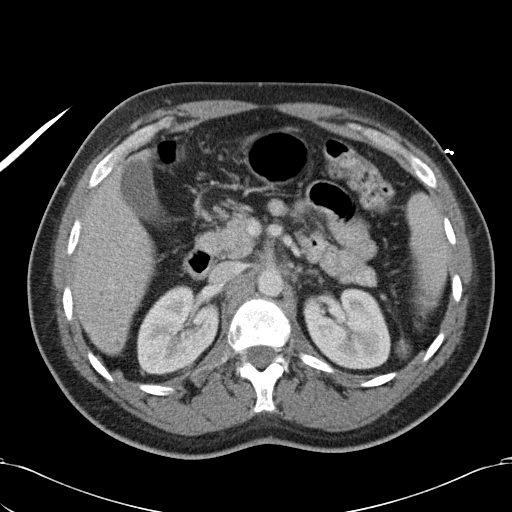
[im 35/68  soft-tissue]
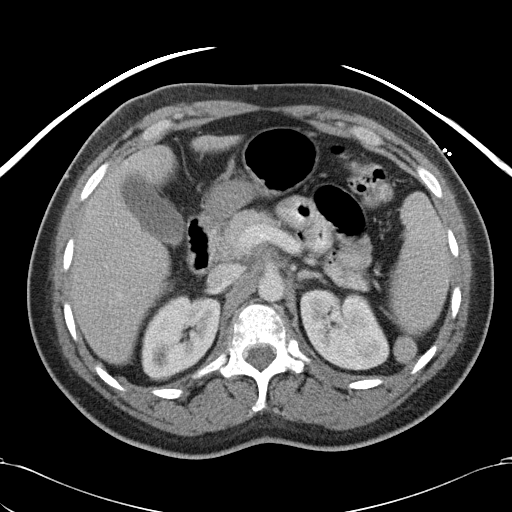
[im 41/68  soft-tissue]
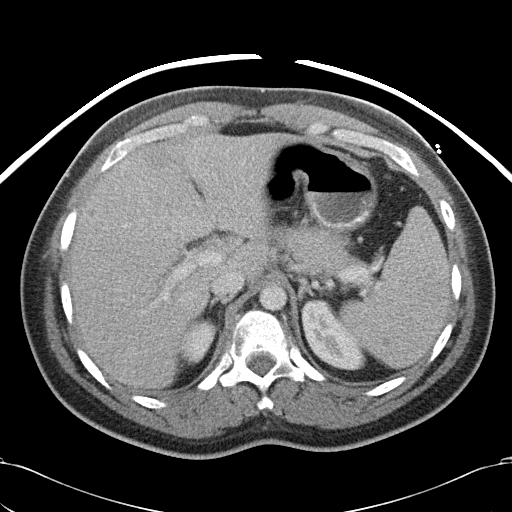
[im 41/68  bone]
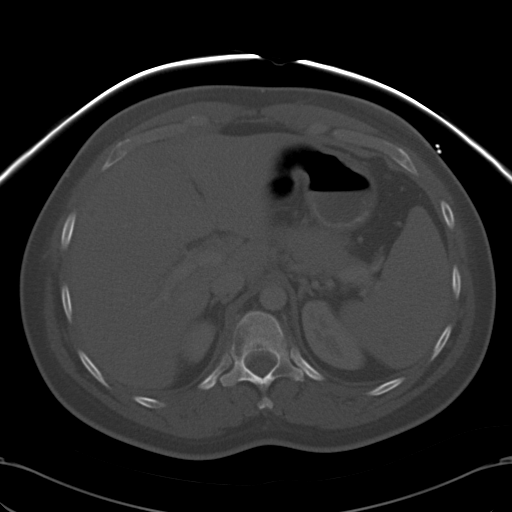
[im 44/68  soft-tissue]
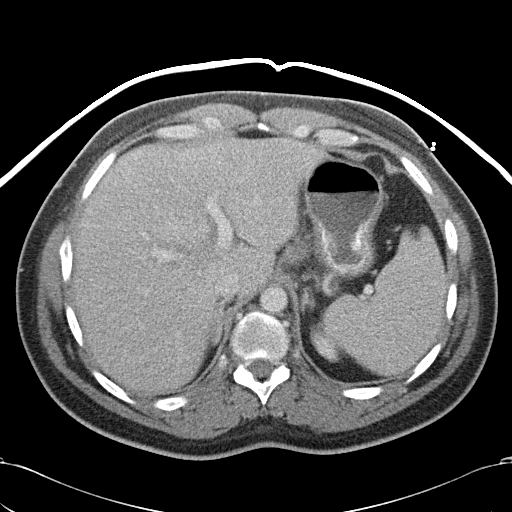
[im 50/68  soft-tissue]
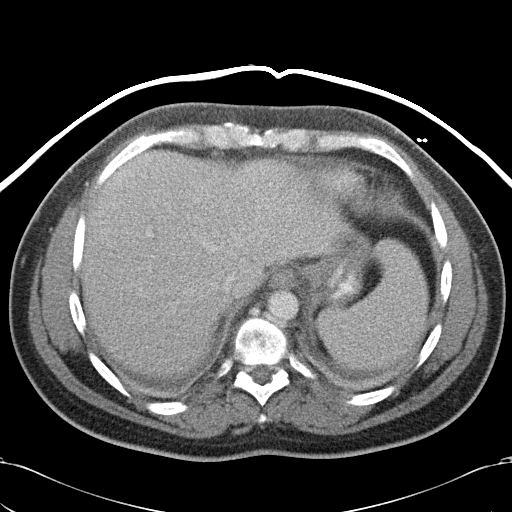
[im 56/68  soft-tissue]
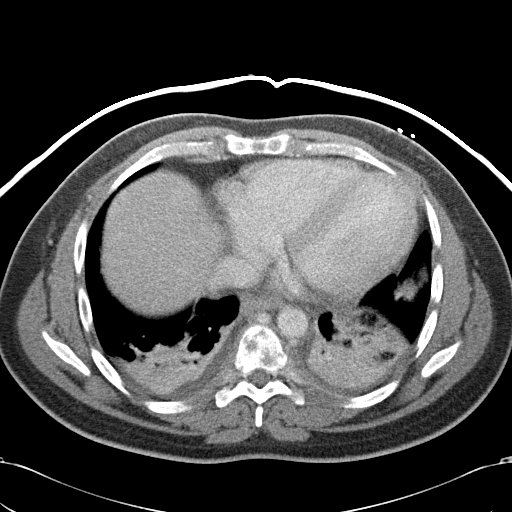
[im 59/68  soft-tissue]
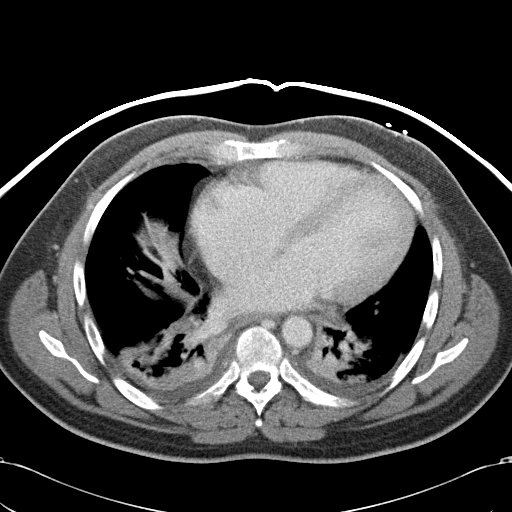
[im 65/68  soft-tissue]
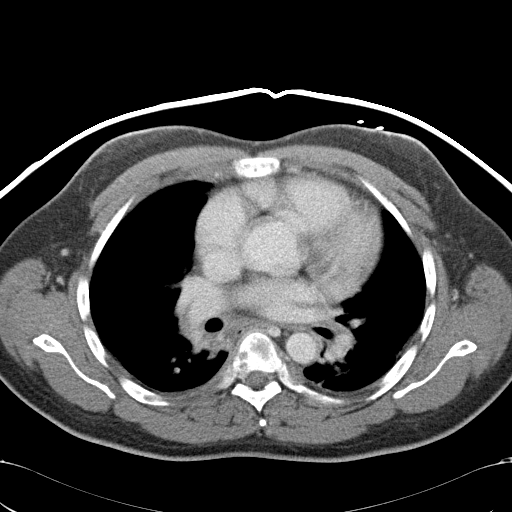

[Series 602: coronal mpr · coronal · 0.71mm/px · 3 of 126 slices shown]
[im 42/126  soft-tissue]
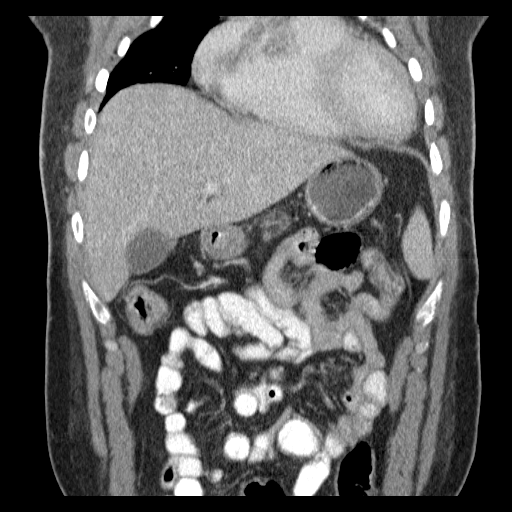
[im 56/126  soft-tissue]
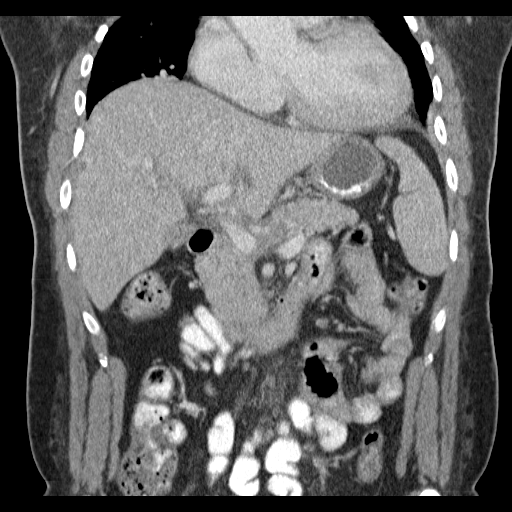
[im 70/126  soft-tissue]
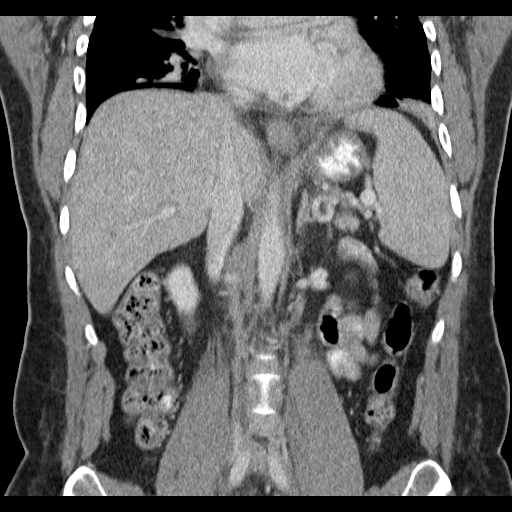

[17 of 46 positions shown; findings below may reference images not displayed]

FINDINGS: Soft tissue density in the right infrahilar is again
noted as seen on the recent chest CT.  Bibasilar consolidation
verse atelectasis and small bilateral pleural effusions have
developed.

Innumerable hypodensities are scattered throughout the liver.
Gallbladder and adrenal glands are unremarkable.  Hypodensity in
the kidneys are compatible with a simple cysts. Small hypodensity
is nonspecific in the spleen on image 28.  Unremarkable pancreas.
No free fluid.  Small nodes and stranding in the fat surrounding
the aorta is present.  Innumerable lytic lesions throughout the
bony framework is also noted.
IMPRESSION: Multiple lytic bone lesions.

Mild focal lesions in the liver and spleen.

Stranding surrounding the aorta.

Above findings are noted on the more recent imaging studies and
differential diagnosis is the same.

Hypodensities in the kidneys are consistent with cysts.

## 2010-09-01 IMAGING — CT CT BIOPSY
1 series · 15 of 32 positions shown, 19 images · non-contrast
Comparison: none

CLINICAL DATA: Multiple lytic lesions of the spine and bony pelvis.
The patient presents for biopsy.

[Series 2: routine pelvis · axial · 0.74mm/px · z∈[-134,-54]mm · 15 of 62 slices shown, 19 images]
[im 4/62  soft-tissue]
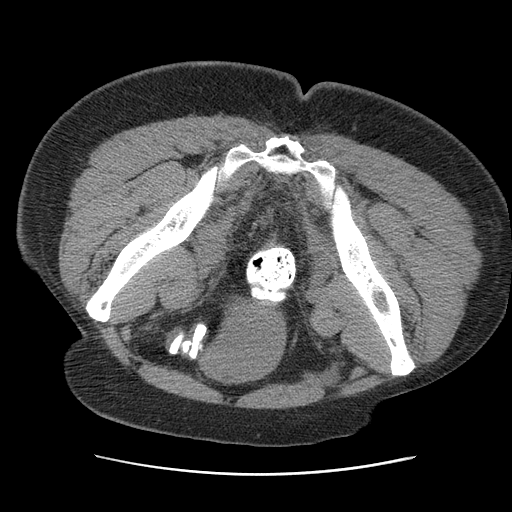
[im 4/62  bone]
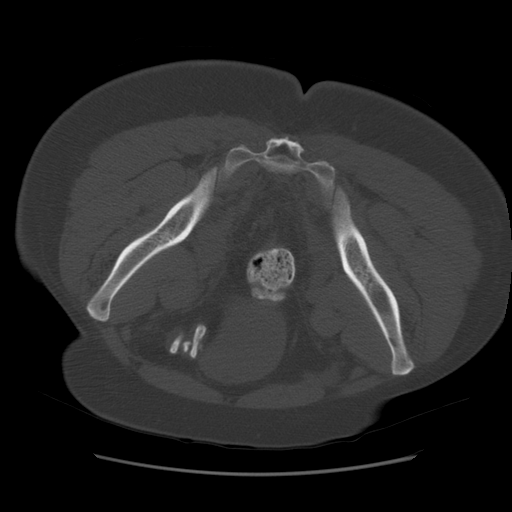
[im 8/62  soft-tissue]
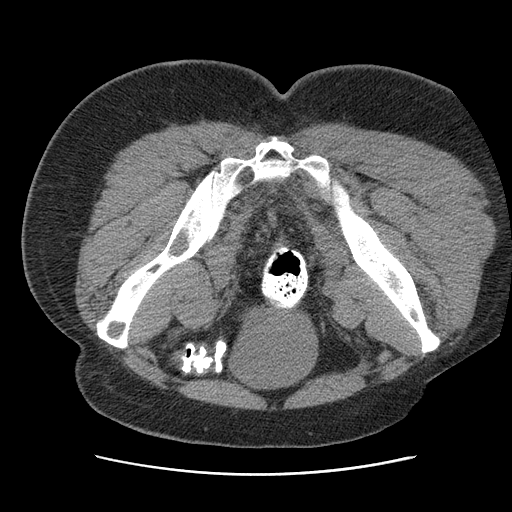
[im 12/62  soft-tissue]
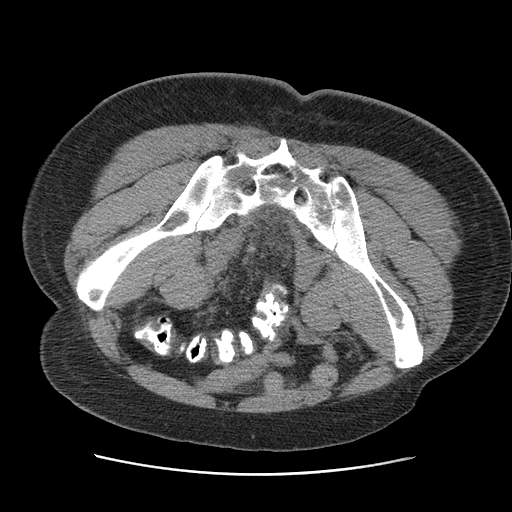
[im 18/62  soft-tissue]
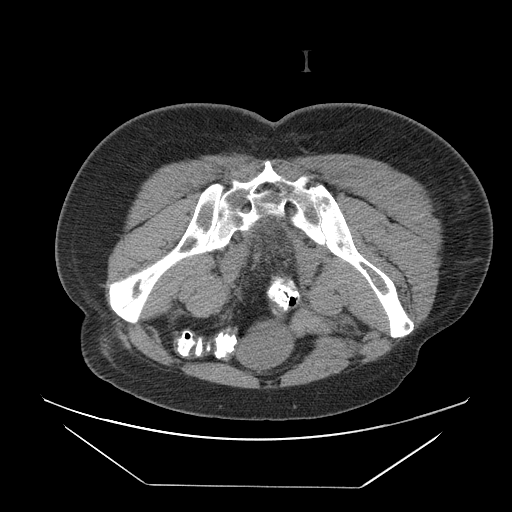
[im 22/62  soft-tissue]
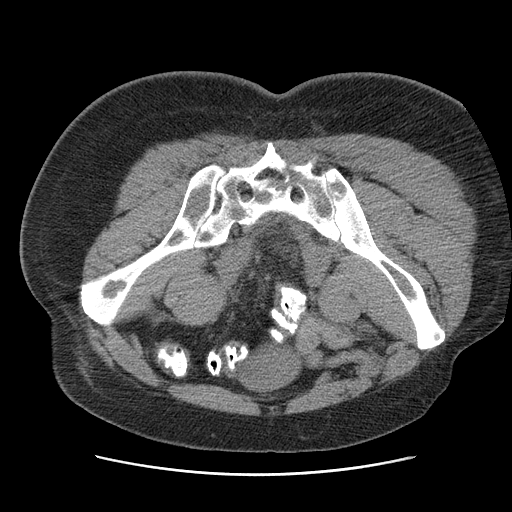
[im 26/62  soft-tissue]
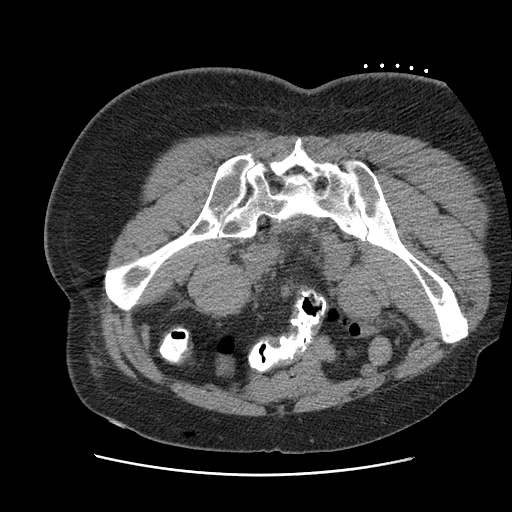
[im 32/62  soft-tissue]
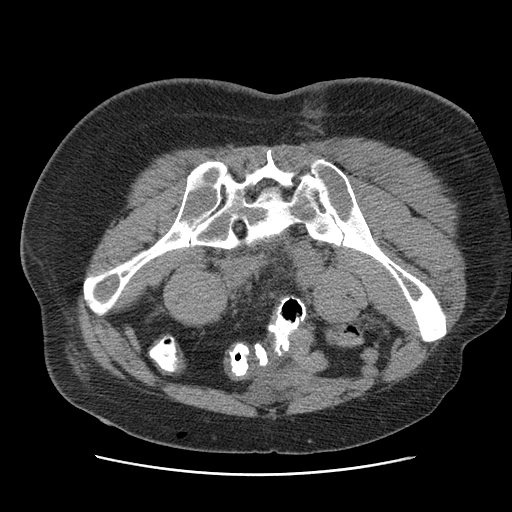
[im 36/62  soft-tissue]
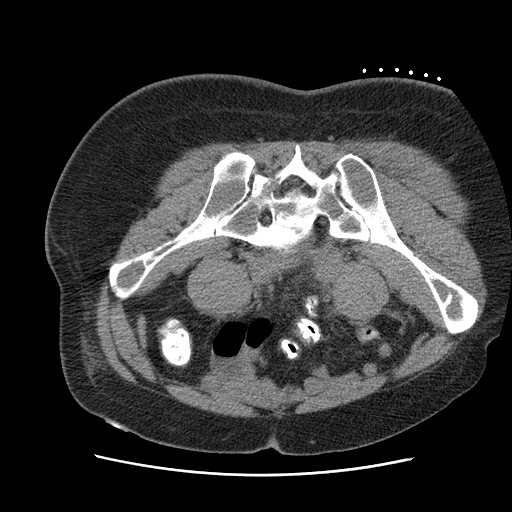
[im 40/62  soft-tissue]
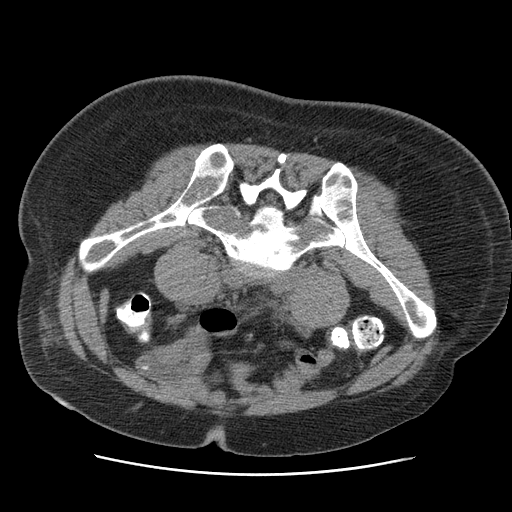
[im 40/62  bone]
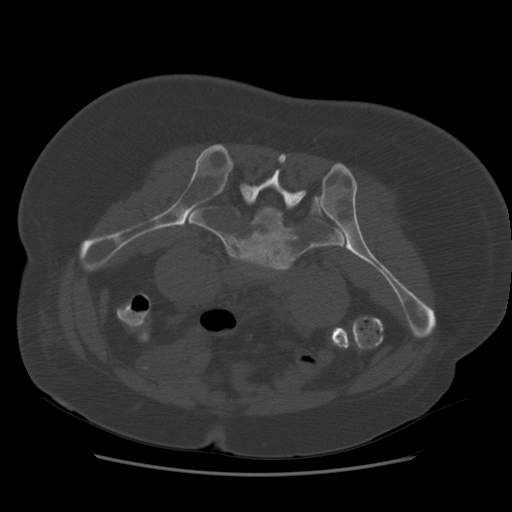
[im 44/62  soft-tissue]
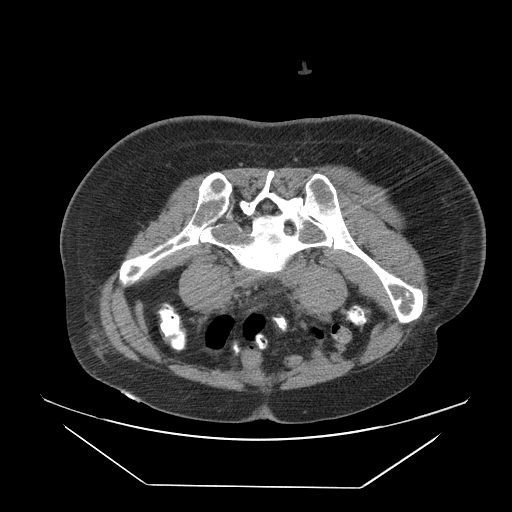
[im 50/62  soft-tissue]
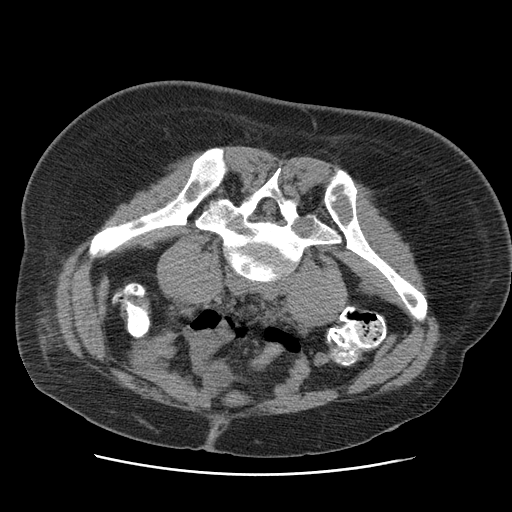
[im 54/62  soft-tissue]
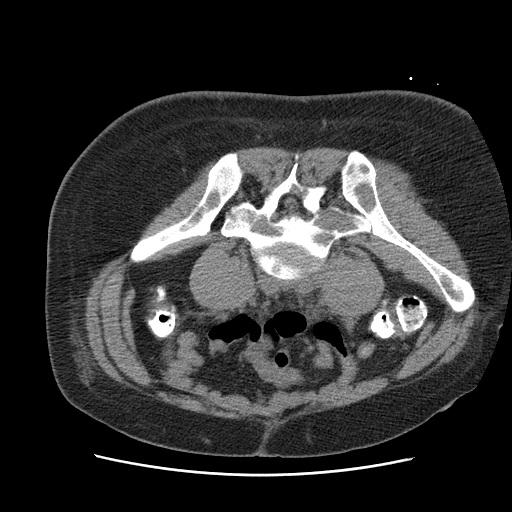
[im 54/62  lung]
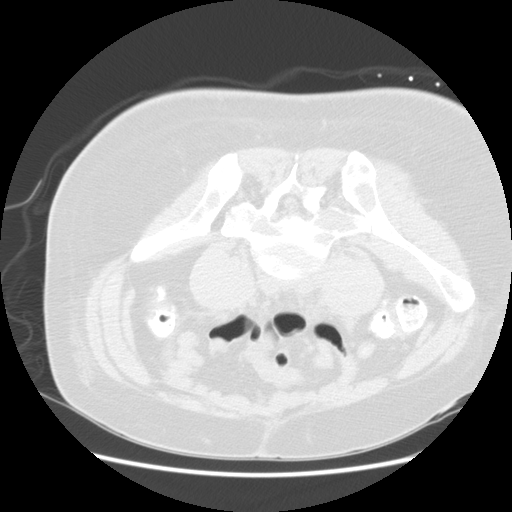
[im 56/62  lung]
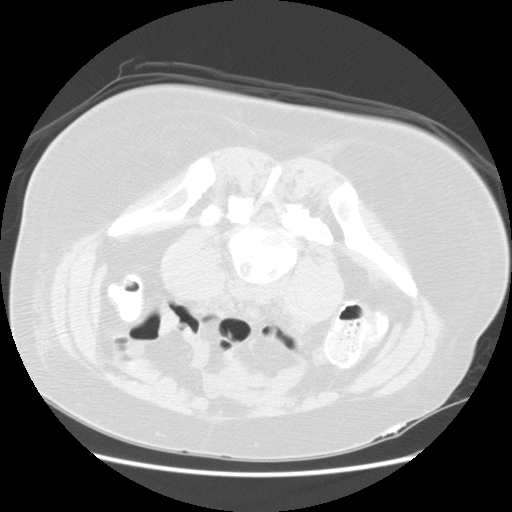
[im 58/62  soft-tissue]
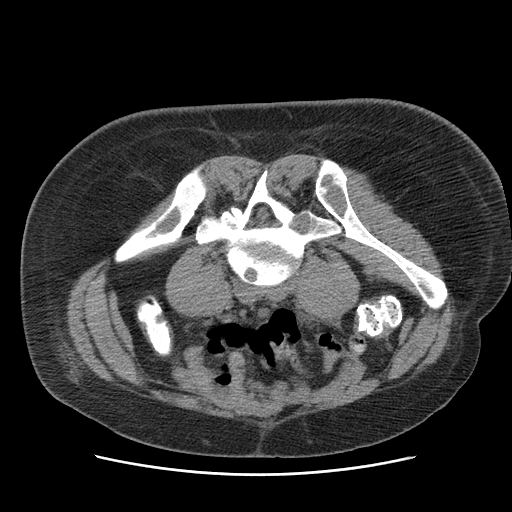
[im 58/62  lung]
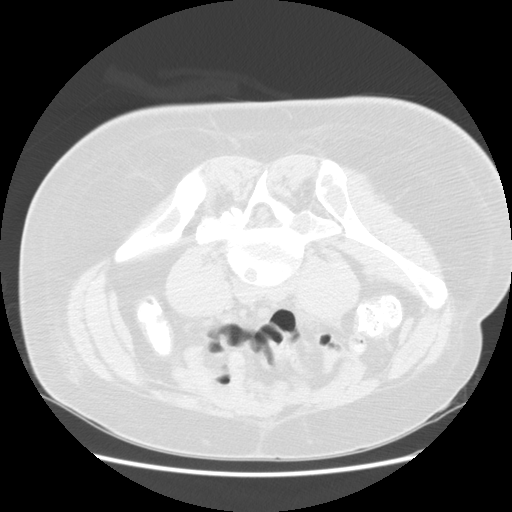
[im 60/62  lung]
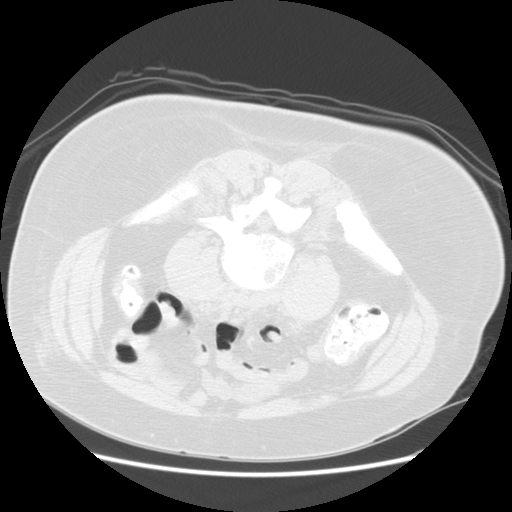

[15 of 32 positions shown; findings below may reference images not displayed]

CT GUIDED ASPIRATE AND CORE BIOPSY OF RIGHT ILIAC BONE

Sedation: Versed 2.0 mg IV, Fentanyl 125 mcg IV

Total Moderate Sedation Time: 25 minutes.

Procedure:  The procedure risks, benefits, and alternatives were
explained to the patient.  Questions regarding the procedure were
encouraged and answered.  The patient understands and consents to
the procedure.

The right gluteal region was prepped with betadine in a sterile
fashion, and a sterile drape was applied covering the operative
field.  A sterile gown and sterile gloves were used for the
procedure.  Local anesthesia was provided with 1% Lidocaine.

Under CT guidance, an 11 gauge bone cutting needle was advanced
into the posterior right iliac bone.  After confirming needle
position by CT, initial aspirates were performed with 20 gauge
Bullet Avi.  Material was submitted for cytology as well as
culture studies.  Additional core biopsy samples were obtained with
a coaxial 14-gauge core needle as well as the outer 11 gauge
needle.  These were submitted in saline.

Complications: None
FINDINGS: Large lytic lesion in the posterior right iliac bone was
targeted for biopsy.  Samples were obtained.  Core biopsy samples
were clot-like in consistency.
IMPRESSION: CT guided needle aspirate and core biopsy of lytic lesion in the
posterior right iliac bone.  Material was submitted for cytology,
culture studies and histology.

## 2010-09-08 ENCOUNTER — Ambulatory Visit (HOSPITAL_COMMUNITY)
Admission: RE | Admit: 2010-09-08 | Discharge: 2010-09-08 | Disposition: A | Discharge status: Home / Self Care | Payer: Self-pay | Source: Ambulatory Visit | Attending: Oncology | Admitting: Oncology

## 2010-09-08 DIAGNOSIS — R110 Nausea: Secondary | ICD-10-CM | POA: Insufficient documentation

## 2010-09-08 DIAGNOSIS — C7952 Secondary malignant neoplasm of bone marrow: Secondary | ICD-10-CM | POA: Insufficient documentation

## 2010-09-08 DIAGNOSIS — C469 Kaposi's sarcoma, unspecified: Secondary | ICD-10-CM | POA: Insufficient documentation

## 2010-09-08 DIAGNOSIS — D72819 Decreased white blood cell count, unspecified: Secondary | ICD-10-CM | POA: Insufficient documentation

## 2010-09-08 DIAGNOSIS — D6859 Other primary thrombophilia: Secondary | ICD-10-CM | POA: Insufficient documentation

## 2010-09-08 DIAGNOSIS — N289 Disorder of kidney and ureter, unspecified: Secondary | ICD-10-CM | POA: Insufficient documentation

## 2010-09-08 DIAGNOSIS — B20 Human immunodeficiency virus [HIV] disease: Secondary | ICD-10-CM | POA: Insufficient documentation

## 2010-09-08 DIAGNOSIS — C7951 Secondary malignant neoplasm of bone: Secondary | ICD-10-CM | POA: Insufficient documentation

## 2010-09-08 MED ORDER — IOHEXOL 300 MG/ML  SOLN
100.0000 mL | Freq: Once | INTRAMUSCULAR | Status: AC | PRN
  Administered 2010-09-08: 100 mL via INTRAVENOUS

## 2010-09-16 NOTE — Consult Note (Signed)
Summary: [facility]  [facility]   Imported By: Florinda Marker 09/07/2010 11:16:57  _____________________________________________________________________  External Attachment:    Type:   Image     Comment:   External Document

## 2010-09-23 ENCOUNTER — Encounter (HOSPITAL_BASED_OUTPATIENT_CLINIC_OR_DEPARTMENT_OTHER): Payer: Self-pay | Admitting: Oncology

## 2010-09-23 ENCOUNTER — Other Ambulatory Visit: Payer: Self-pay | Admitting: Oncology

## 2010-09-23 DIAGNOSIS — C467 Kaposi's sarcoma of other sites: Secondary | ICD-10-CM

## 2010-09-23 DIAGNOSIS — C3490 Malignant neoplasm of unspecified part of unspecified bronchus or lung: Secondary | ICD-10-CM

## 2010-09-23 DIAGNOSIS — B20 Human immunodeficiency virus [HIV] disease: Secondary | ICD-10-CM

## 2010-09-23 LAB — CBC
HCT: 30 % — ABNORMAL LOW (ref 39.0–52.0)
Hemoglobin: 11.3 g/dL — ABNORMAL LOW (ref 13.0–17.0)
Hemoglobin: 9.6 g/dL — ABNORMAL LOW (ref 13.0–17.0)
MCH: 23.8 pg — ABNORMAL LOW (ref 26.0–34.0)
MCHC: 32 g/dL (ref 30.0–36.0)
MCHC: 32.4 g/dL (ref 30.0–36.0)
MCV: 74.3 fL — ABNORMAL LOW (ref 78.0–100.0)
Platelets: 285 10*3/uL (ref 150–400)
RBC: 4.04 MIL/uL — ABNORMAL LOW (ref 4.22–5.81)
RDW: 17.1 % — ABNORMAL HIGH (ref 11.5–15.5)
RDW: 17.4 % — ABNORMAL HIGH (ref 11.5–15.5)
WBC: 4.6 10*3/uL (ref 4.0–10.5)
WBC: 5.5 10*3/uL (ref 4.0–10.5)

## 2010-09-23 LAB — BASIC METABOLIC PANEL WITH GFR
BUN: 6 mg/dL (ref 6–23)
CO2: 28 meq/L (ref 19–32)
Calcium: 9.2 mg/dL (ref 8.4–10.5)
Chloride: 99 meq/L (ref 96–112)
Creatinine, Ser: 1.14 mg/dL (ref 0.4–1.5)
GFR calc non Af Amer: >60 mL/min
Glucose, Bld: 100 mg/dL — ABNORMAL HIGH (ref 70–99)
Potassium: 4.4 meq/L (ref 3.5–5.1)
Sodium: 134 meq/L — ABNORMAL LOW (ref 135–145)

## 2010-09-23 LAB — BODY FLUID CULTURE: Culture: NO GROWTH

## 2010-09-23 LAB — T-HELPER CELL (CD4) - (RCID CLINIC ONLY)
CD4 % Helper T Cell: 7 % — ABNORMAL LOW (ref 33–55)
CD4 T Cell Abs: 80 uL — ABNORMAL LOW (ref 400–2700)

## 2010-09-23 LAB — COMPREHENSIVE METABOLIC PANEL WITH GFR
ALT: 13 U/L (ref 0–53)
AST: 16 U/L (ref 0–37)
Albumin: 3.6 g/dL (ref 3.5–5.2)
Alkaline Phosphatase: 92 U/L (ref 39–117)
BUN: 4 mg/dL — ABNORMAL LOW (ref 6–23)
CO2: 30 meq/L (ref 19–32)
Calcium: 9.5 mg/dL (ref 8.4–10.5)
Chloride: 99 meq/L (ref 96–112)
Creatinine, Ser: 1.14 mg/dL (ref 0.4–1.5)
GFR calc non Af Amer: >60 mL/min
Glucose, Bld: 105 mg/dL — ABNORMAL HIGH (ref 70–99)
Potassium: 3.9 meq/L (ref 3.5–5.1)
Sodium: 137 meq/L (ref 135–145)
Total Bilirubin: 1.2 mg/dL (ref 0.3–1.2)
Total Protein: 7.4 g/dL (ref 6.0–8.3)

## 2010-09-23 LAB — CULTURE, BLOOD (ROUTINE X 2): Culture: NO GROWTH

## 2010-09-23 LAB — PREPARE FRESH FROZEN PLASMA

## 2010-09-23 LAB — CROSSMATCH
ABO/RH(D): O POS
Antibody Screen: NEGATIVE

## 2010-09-23 LAB — LUPUS ANTICOAGULANT PANEL
DRVVT: 55 s — ABNORMAL HIGH (ref 36.2–44.3)
Lupus Anticoagulant: DETECTED — AB
Lupus Anticoagulant: DETECTED — AB
PTT Lupus Anticoagulant: 53.6 secs — ABNORMAL HIGH (ref 30.0–45.6)
PTT Lupus Anticoagulant: 68.5 s — ABNORMAL HIGH (ref 30.0–45.6)
PTTLA 4:1 Mix: 63.8 s — ABNORMAL HIGH (ref 30.0–45.6)
PTTLA Confirmation: 13.7 s — ABNORMAL HIGH
dRVVT Incubated 1:1 Mix: 42.7 s (ref 36.2–44.3)

## 2010-09-23 LAB — EXPECTORATED SPUTUM ASSESSMENT W GRAM STAIN, RFLX TO RESP C

## 2010-09-23 LAB — DIFFERENTIAL
Basophils Absolute: 0.1 10*3/uL (ref 0.0–0.1)
Basophils Relative: 1 % (ref 0–1)
Eosinophils Absolute: 0.4 10*3/uL (ref 0.0–0.7)
Eosinophils Relative: 17 % — ABNORMAL HIGH (ref 0–5)
Eosinophils Relative: 7 % — ABNORMAL HIGH (ref 0–5)
Lymphocytes Relative: 21 % (ref 12–46)
Lymphs Abs: 1.2 10*3/uL (ref 0.7–4.0)
Lymphs Abs: 1.2 10*3/uL (ref 0.7–4.0)
Monocytes Absolute: 0.4 10*3/uL (ref 0.1–1.0)
Monocytes Absolute: 0.4 10*3/uL (ref 0.1–1.0)
Monocytes Relative: 7 % (ref 3–12)
Monocytes Relative: 8 % (ref 3–12)
Neutro Abs: 2.2 10*3/uL (ref 1.7–7.7)
Neutro Abs: 3.4 10*3/uL (ref 1.7–7.7)
Neutrophils Relative %: 64 % (ref 43–77)

## 2010-09-23 LAB — CULTURE, RESPIRATORY W GRAM STAIN: Culture: NORMAL

## 2010-09-23 LAB — BETA-2-GLYCOPROTEIN I ABS, IGG/M/A
Beta-2 Glyco I IgG: 0 G Units (ref <20)
Beta-2-Glycoprotein I IgM: 0 M Units (ref <20)

## 2010-09-23 LAB — URINALYSIS, ROUTINE W REFLEX MICROSCOPIC
Bilirubin Urine: NEGATIVE
Glucose, UA: NEGATIVE mg/dL
Hgb urine dipstick: NEGATIVE
Protein, ur: NEGATIVE mg/dL
Specific Gravity, Urine: 1.01 (ref 1.005–1.030)
Urobilinogen, UA: 4 mg/dL — ABNORMAL HIGH (ref 0.0–1.0)

## 2010-09-23 LAB — APTT: aPTT: 48 seconds — ABNORMAL HIGH (ref 24–37)

## 2010-09-23 LAB — APTT MIXING STUDY SCREEN: aPTT Baseline: 44.9 seconds — ABNORMAL HIGH (ref 24–37)

## 2010-09-23 LAB — PROTIME-INR
INR: 1.08 (ref 0.00–1.49)
Prothrombin Time: 14.2 seconds (ref 11.6–15.2)

## 2010-09-23 LAB — PTT FACTOR INHIBITOR (MIXING STUDY)
1 Hr Incub APTT 4:1NP: 44 s
1 Hr Incub PT 1:1NP: 40 s

## 2010-09-23 LAB — PT MIXING STUDY SCREEN: PT Baseline: 13.6 seconds (ref 11.6–15.2)

## 2010-09-23 LAB — FACTOR 8 ASSAY: Coagulation Factor VIII: 121 % (ref 73–140)

## 2010-09-24 LAB — DIFFERENTIAL
Band Neutrophils: 0 % (ref 0–10)
Basophils Absolute: 0 10*3/uL (ref 0.0–0.1)
Basophils Absolute: 0.1 10*3/uL (ref 0.0–0.1)
Basophils Relative: 0 % (ref 0–1)
Basophils Relative: 1 % (ref 0–1)
Blasts: 0 %
Eosinophils Absolute: 0.4 10*3/uL (ref 0.0–0.7)
Eosinophils Relative: 6 % — ABNORMAL HIGH (ref 0–5)
Eosinophils Relative: 6 % — ABNORMAL HIGH (ref 0–5)
Lymphocytes Relative: 16 % (ref 12–46)
Lymphocytes Relative: 19 % (ref 12–46)
Lymphocytes Relative: 4 % — ABNORMAL LOW (ref 12–46)
Lymphs Abs: 0.3 10*3/uL — ABNORMAL LOW (ref 0.7–4.0)
Lymphs Abs: 0.8 10*3/uL (ref 0.7–4.0)
Lymphs Abs: 1.2 10*3/uL (ref 0.7–4.0)
Monocytes Absolute: 0.3 10*3/uL (ref 0.1–1.0)
Monocytes Absolute: 0.4 10*3/uL (ref 0.1–1.0)
Monocytes Relative: 4 % (ref 3–12)
Monocytes Relative: 8 % (ref 3–12)
Monocytes Relative: 8 % (ref 3–12)
Neutro Abs: 2.6 10*3/uL (ref 1.7–7.7)
Neutro Abs: 5.2 10*3/uL (ref 1.7–7.7)
Neutro Abs: 7 10*3/uL (ref 1.7–7.7)
Neutrophils Relative %: 59 % (ref 43–77)
Neutrophils Relative %: 87 % — ABNORMAL HIGH (ref 43–77)

## 2010-09-24 LAB — CBC
HCT: 32.3 % — ABNORMAL LOW (ref 39.0–52.0)
HCT: 34.9 % — ABNORMAL LOW (ref 39.0–52.0)
HCT: 37.7 % — ABNORMAL LOW (ref 39.0–52.0)
Hemoglobin: 10.6 g/dL — ABNORMAL LOW (ref 13.0–17.0)
Hemoglobin: 11.2 g/dL — ABNORMAL LOW (ref 13.0–17.0)
Hemoglobin: 12.3 g/dL — ABNORMAL LOW (ref 13.0–17.0)
MCH: 24.4 pg — ABNORMAL LOW (ref 26.0–34.0)
MCH: 24.5 pg — ABNORMAL LOW (ref 26.0–34.0)
MCH: 24.5 pg — ABNORMAL LOW (ref 26.0–34.0)
MCHC: 32.1 g/dL (ref 30.0–36.0)
MCHC: 32.1 g/dL (ref 30.0–36.0)
MCHC: 32.3 g/dL (ref 30.0–36.0)
MCHC: 32.4 g/dL (ref 30.0–36.0)
MCV: 75.4 fL — ABNORMAL LOW (ref 78.0–100.0)
MCV: 76.4 fL — ABNORMAL LOW (ref 78.0–100.0)
Platelets: 195 10*3/uL (ref 150–400)
Platelets: 202 10*3/uL (ref 150–400)
Platelets: 238 10*3/uL (ref 150–400)
Platelets: 248 10*3/uL (ref 150–400)
Platelets: 272 10*3/uL (ref 150–400)
RBC: 4.16 MIL/uL — ABNORMAL LOW (ref 4.22–5.81)
RBC: 4.57 MIL/uL (ref 4.22–5.81)
RDW: 17 % — ABNORMAL HIGH (ref 11.5–15.5)
RDW: 17.3 % — ABNORMAL HIGH (ref 11.5–15.5)
RDW: 18.2 % — ABNORMAL HIGH (ref 11.5–15.5)
RDW: 18.7 % — ABNORMAL HIGH (ref 11.5–15.5)
RDW: 18.8 % — ABNORMAL HIGH (ref 11.5–15.5)
WBC: 4.4 10*3/uL (ref 4.0–10.5)
WBC: 6.2 10*3/uL (ref 4.0–10.5)
WBC: 6.8 10*3/uL (ref 4.0–10.5)
WBC: 8 10*3/uL (ref 4.0–10.5)

## 2010-09-24 LAB — TYPE AND SCREEN
ABO/RH(D): O POS
Antibody Screen: NEGATIVE

## 2010-09-24 LAB — BASIC METABOLIC PANEL
BUN: 10 mg/dL (ref 6–23)
BUN: 5 mg/dL — ABNORMAL LOW (ref 6–23)
BUN: 6 mg/dL (ref 6–23)
CO2: 23 mEq/L (ref 19–32)
Calcium: 8.4 mg/dL (ref 8.4–10.5)
Calcium: 9 mg/dL (ref 8.4–10.5)
Chloride: 100 mEq/L (ref 96–112)
Chloride: 107 mEq/L (ref 96–112)
Chloride: 109 mEq/L (ref 96–112)
Creatinine, Ser: 0.85 mg/dL (ref 0.4–1.5)
Creatinine, Ser: 1.2 mg/dL (ref 0.4–1.5)
Creatinine, Ser: 1.25 mg/dL (ref 0.4–1.5)
GFR calc Af Amer: >60 mL/min (ref >60)
GFR calc Af Amer: >60 mL/min (ref >60)
GFR calc non Af Amer: >60 mL/min (ref >60)
Glucose, Bld: 92 mg/dL (ref 70–99)
Glucose, Bld: 96 mg/dL (ref 70–99)
Glucose, Bld: 99 mg/dL (ref 70–99)
Potassium: 3.6 mEq/L (ref 3.5–5.1)
Potassium: 3.8 mEq/L (ref 3.5–5.1)
Potassium: 4.4 mEq/L (ref 3.5–5.1)
Sodium: 141 mEq/L (ref 135–145)

## 2010-09-24 LAB — URINALYSIS, ROUTINE W REFLEX MICROSCOPIC
Bilirubin Urine: NEGATIVE
Glucose, UA: NEGATIVE mg/dL
Glucose, UA: NEGATIVE mg/dL
Hgb urine dipstick: NEGATIVE
Ketones, ur: NEGATIVE mg/dL
Nitrite: NEGATIVE
Specific Gravity, Urine: 1.01 (ref 1.005–1.030)
Specific Gravity, Urine: 1.02 (ref 1.005–1.030)
pH: 5.5 (ref 5.0–8.0)

## 2010-09-24 LAB — PREPARE FRESH FROZEN PLASMA

## 2010-09-24 LAB — MRSA PCR SCREENING
MRSA by PCR: NEGATIVE
MRSA by PCR: NEGATIVE

## 2010-09-24 LAB — CULTURE, BLOOD (ROUTINE X 2)

## 2010-09-24 LAB — COMPREHENSIVE METABOLIC PANEL
ALT: 14 U/L (ref 0–53)
AST: 16 U/L (ref 0–37)
Albumin: 4.1 g/dL (ref 3.5–5.2)
CO2: 26 mEq/L (ref 19–32)
Calcium: 9.3 mg/dL (ref 8.4–10.5)
Creatinine, Ser: 1.16 mg/dL (ref 0.4–1.5)
GFR calc Af Amer: >60 mL/min (ref >60)
Sodium: 140 mEq/L (ref 135–145)
Total Protein: 7.1 g/dL (ref 6.0–8.3)

## 2010-09-24 LAB — HIV-1 RNA QUANT-NO REFLEX-BLD: HIV 1 RNA Quant: 53 copies/mL — ABNORMAL HIGH (ref <48)

## 2010-09-24 LAB — ABO/RH: ABO/RH(D): O POS

## 2010-09-24 LAB — PROTIME-INR
INR: 1.1 (ref 0.00–1.49)
Prothrombin Time: 14.4 seconds (ref 11.6–15.2)

## 2010-09-24 LAB — PREPARE RBC (CROSSMATCH)

## 2010-09-24 LAB — T-HELPER CELLS (CD4) COUNT (NOT AT ARMC)
CD4 % Helper T Cell: 8 % — ABNORMAL LOW (ref 33–55)
CD4 T Cell Abs: 90 uL — ABNORMAL LOW (ref 400–2700)

## 2010-09-24 LAB — URINE CULTURE

## 2010-09-26 LAB — BASIC METABOLIC PANEL
BUN: 10 mg/dL (ref 6–23)
BUN: 10 mg/dL (ref 6–23)
BUN: 12 mg/dL (ref 6–23)
BUN: 14 mg/dL (ref 6–23)
BUN: 17 mg/dL (ref 6–23)
BUN: 19 mg/dL (ref 6–23)
BUN: 22 mg/dL (ref 6–23)
BUN: 26 mg/dL — ABNORMAL HIGH (ref 6–23)
CO2: 22 mEq/L (ref 19–32)
CO2: 22 mEq/L (ref 19–32)
CO2: 23 mEq/L (ref 19–32)
CO2: 23 mEq/L (ref 19–32)
CO2: 26 mEq/L (ref 19–32)
CO2: 27 mEq/L (ref 19–32)
Calcium: 7.7 mg/dL — ABNORMAL LOW (ref 8.4–10.5)
Calcium: 8.2 mg/dL — ABNORMAL LOW (ref 8.4–10.5)
Calcium: 8.4 mg/dL (ref 8.4–10.5)
Calcium: 8.8 mg/dL (ref 8.4–10.5)
Chloride: 101 mEq/L (ref 96–112)
Chloride: 103 mEq/L (ref 96–112)
Chloride: 105 mEq/L (ref 96–112)
Chloride: 105 mEq/L (ref 96–112)
Chloride: 108 mEq/L (ref 96–112)
Creatinine, Ser: 1.31 mg/dL (ref 0.4–1.5)
Creatinine, Ser: 1.49 mg/dL (ref 0.4–1.5)
Creatinine, Ser: 1.53 mg/dL — ABNORMAL HIGH (ref 0.4–1.5)
GFR calc Af Amer: >60 mL/min (ref >60)
GFR calc Af Amer: >60 mL/min (ref >60)
GFR calc non Af Amer: 39 mL/min — ABNORMAL LOW (ref >60)
GFR calc non Af Amer: >60 mL/min (ref >60)
Glucose, Bld: 102 mg/dL — ABNORMAL HIGH (ref 70–99)
Glucose, Bld: 102 mg/dL — ABNORMAL HIGH (ref 70–99)
Glucose, Bld: 132 mg/dL — ABNORMAL HIGH (ref 70–99)
Glucose, Bld: 141 mg/dL — ABNORMAL HIGH (ref 70–99)
Glucose, Bld: 149 mg/dL — ABNORMAL HIGH (ref 70–99)
Glucose, Bld: 157 mg/dL — ABNORMAL HIGH (ref 70–99)
Glucose, Bld: 159 mg/dL — ABNORMAL HIGH (ref 70–99)
Potassium: 3.4 mEq/L — ABNORMAL LOW (ref 3.5–5.1)
Potassium: 3.8 mEq/L (ref 3.5–5.1)
Potassium: 4 mEq/L (ref 3.5–5.1)
Potassium: 4.4 mEq/L (ref 3.5–5.1)
Sodium: 132 mEq/L — ABNORMAL LOW (ref 135–145)
Sodium: 133 mEq/L — ABNORMAL LOW (ref 135–145)
Sodium: 134 mEq/L — ABNORMAL LOW (ref 135–145)

## 2010-09-26 LAB — LEGIONELLA ANTIGEN, URINE: Legionella Antigen, Urine: NEGATIVE

## 2010-09-26 LAB — CBC
HCT: 34.2 % — ABNORMAL LOW (ref 39.0–52.0)
Hemoglobin: 11 g/dL — ABNORMAL LOW (ref 13.0–17.0)
Hemoglobin: 12 g/dL — ABNORMAL LOW (ref 13.0–17.0)
MCH: 26 pg (ref 26.0–34.0)
MCH: 26.4 pg (ref 26.0–34.0)
MCH: 27.8 pg (ref 26.0–34.0)
MCHC: 32.5 g/dL (ref 30.0–36.0)
MCHC: 32.8 g/dL (ref 30.0–36.0)
MCHC: 33.1 g/dL (ref 30.0–36.0)
MCV: 78.9 fL (ref 78.0–100.0)
MCV: 79 fL (ref 78.0–100.0)
MCV: 79.2 fL (ref 78.0–100.0)
MCV: 84.1 fL (ref 78.0–100.0)
Platelets: 182 10*3/uL (ref 150–400)
Platelets: 194 10*3/uL (ref 150–400)
Platelets: 199 10*3/uL (ref 150–400)
Platelets: 213 10*3/uL (ref 150–400)
RBC: 5.12 MIL/uL (ref 4.22–5.81)
RBC: 5.18 MIL/uL (ref 4.22–5.81)
RDW: 13.4 % (ref 11.5–15.5)
RDW: 13.8 % (ref 11.5–15.5)
RDW: 13.9 % (ref 11.5–15.5)
RDW: 14 % (ref 11.5–15.5)
RDW: 14 % (ref 11.5–15.5)
RDW: 14.3 % (ref 11.5–15.5)
WBC: 10.4 10*3/uL (ref 4.0–10.5)
WBC: 10.8 10*3/uL — ABNORMAL HIGH (ref 4.0–10.5)
WBC: 11 10*3/uL — ABNORMAL HIGH (ref 4.0–10.5)

## 2010-09-26 LAB — DIFFERENTIAL
Basophils Absolute: 0 10*3/uL (ref 0.0–0.1)
Basophils Absolute: 0 10*3/uL (ref 0.0–0.1)
Basophils Absolute: 0 10*3/uL (ref 0.0–0.1)
Basophils Relative: 0 % (ref 0–1)
Eosinophils Absolute: 0.5 10*3/uL (ref 0.0–0.7)
Eosinophils Absolute: 0.5 10*3/uL (ref 0.0–0.7)
Eosinophils Relative: 2 % (ref 0–5)
Eosinophils Relative: 5 % (ref 0–5)
Lymphs Abs: 1.2 10*3/uL (ref 0.7–4.0)
Monocytes Absolute: 0.3 10*3/uL (ref 0.1–1.0)
Monocytes Relative: 3 % (ref 3–12)
Monocytes Relative: 7 % (ref 3–12)
Neutro Abs: 28.4 10*3/uL — ABNORMAL HIGH (ref 1.7–7.7)
Neutro Abs: 6.7 10*3/uL (ref 1.7–7.7)
Neutrophils Relative %: 75 % (ref 43–77)

## 2010-09-26 LAB — DRUGS OF ABUSE SCREEN W/O ALC, ROUTINE URINE
Benzodiazepines.: NEGATIVE
Cocaine Metabolites: NEGATIVE
Methadone: NEGATIVE
Opiate Screen, Urine: NEGATIVE
Phencyclidine (PCP): NEGATIVE
Propoxyphene: NEGATIVE

## 2010-09-26 LAB — CULTURE, RESPIRATORY W GRAM STAIN

## 2010-09-26 LAB — POCT I-STAT 3, ART BLOOD GAS (G3+)
Bicarbonate: 24.3 mEq/L — ABNORMAL HIGH (ref 20.0–24.0)
Patient temperature: 100.6
TCO2: 26 mmol/L (ref 0–100)
pCO2 arterial: 44.7 mmHg (ref 35.0–45.0)
pH, Arterial: 7.348 — ABNORMAL LOW (ref 7.350–7.450)

## 2010-09-26 LAB — URINALYSIS, ROUTINE W REFLEX MICROSCOPIC
Bilirubin Urine: NEGATIVE
Glucose, UA: NEGATIVE mg/dL
Glucose, UA: NEGATIVE mg/dL
Hgb urine dipstick: NEGATIVE
Hgb urine dipstick: NEGATIVE
Ketones, ur: NEGATIVE mg/dL
Leukocytes, UA: NEGATIVE
Nitrite: NEGATIVE
Protein, ur: 30 mg/dL — AB
Protein, ur: NEGATIVE mg/dL
Protein, ur: NEGATIVE mg/dL
Specific Gravity, Urine: 1.023 (ref 1.005–1.030)
Urobilinogen, UA: 0.2 mg/dL (ref 0.0–1.0)

## 2010-09-26 LAB — PNEUMOCYSTIS JIROVECI SMEAR BY DFA: Pneumocystis jiroveci Ag: NOT DETECTED

## 2010-09-26 LAB — COMPREHENSIVE METABOLIC PANEL
ALT: 31 U/L (ref 0–53)
AST: 24 U/L (ref 0–37)
Albumin: 3.9 g/dL (ref 3.5–5.2)
Alkaline Phosphatase: 147 U/L — ABNORMAL HIGH (ref 39–117)
CO2: 30 mEq/L (ref 19–32)
Chloride: 101 mEq/L (ref 96–112)
GFR calc Af Amer: >60 mL/min (ref >60)
GFR calc non Af Amer: >60 mL/min (ref >60)
Potassium: 4.3 mEq/L (ref 3.5–5.1)
Sodium: 134 mEq/L — ABNORMAL LOW (ref 135–145)
Total Bilirubin: 1.1 mg/dL (ref 0.3–1.2)

## 2010-09-26 LAB — EXPECTORATED SPUTUM ASSESSMENT W GRAM STAIN, RFLX TO RESP C

## 2010-09-26 LAB — CULTURE, BLOOD (ROUTINE X 2): Culture: NO GROWTH

## 2010-09-26 LAB — PROCALCITONIN: Procalcitonin: <0.5 ng/mL

## 2010-09-26 LAB — URINE MICROSCOPIC-ADD ON

## 2010-09-26 LAB — CRYPTOCOCCAL ANTIGEN: Crypto Ag: NEGATIVE

## 2010-09-26 LAB — LACTATE DEHYDROGENASE: LDH: 268 U/L — ABNORMAL HIGH (ref 94–250)

## 2010-09-26 LAB — AFB CULTURE WITH SMEAR (NOT AT ARMC)

## 2010-09-26 LAB — SODIUM, URINE, RANDOM: Sodium, Ur: 14 mEq/L

## 2010-09-28 LAB — T-HELPER CELL (CD4) - (RCID CLINIC ONLY): CD4 % Helper T Cell: 5 % — ABNORMAL LOW (ref 33–55)

## 2010-10-06 ENCOUNTER — Other Ambulatory Visit: Payer: Self-pay | Admitting: Internal Medicine

## 2010-10-06 DIAGNOSIS — B20 Human immunodeficiency virus [HIV] disease: Secondary | ICD-10-CM

## 2010-10-07 ENCOUNTER — Encounter: Payer: Self-pay | Admitting: Infectious Diseases

## 2010-10-07 ENCOUNTER — Other Ambulatory Visit: Payer: Self-pay | Admitting: *Deleted

## 2010-10-07 DIAGNOSIS — B20 Human immunodeficiency virus [HIV] disease: Secondary | ICD-10-CM

## 2010-10-07 MED ORDER — SULFAMETHOXAZOLE-TMP DS 800-160 MG PO TABS: 1.0000 | ORAL_TABLET | Freq: Every day | ORAL | Status: DC

## 2010-10-07 MED ORDER — SULFAMETHOXAZOLE-TMP DS 800-160 MG PO TABS: 1.0000 | ORAL_TABLET | ORAL | Status: DC

## 2010-10-15 LAB — COMPREHENSIVE METABOLIC PANEL
ALT: 32 U/L (ref 0–53)
AST: 27 U/L (ref 0–37)
Alkaline Phosphatase: 128 U/L — ABNORMAL HIGH (ref 39–117)
BUN: 22 mg/dL (ref 6–23)
CO2: 33 mEq/L — ABNORMAL HIGH (ref 19–32)
CO2: 24 mEq/L (ref 19–32)
CO2: 26 mEq/L (ref 19–32)
Calcium: 8.5 mg/dL (ref 8.4–10.5)
Calcium: 9.1 mg/dL (ref 8.4–10.5)
Chloride: 95 mEq/L — ABNORMAL LOW (ref 96–112)
Chloride: 101 mEq/L (ref 96–112)
Creatinine, Ser: 1.82 mg/dL — ABNORMAL HIGH (ref 0.4–1.5)
Creatinine, Ser: 1.1 mg/dL (ref 0.4–1.5)
Creatinine, Ser: 1.15 mg/dL (ref 0.4–1.5)
GFR calc Af Amer: 54 mL/min — ABNORMAL LOW (ref >60)
GFR calc Af Amer: >60 mL/min (ref >60)
GFR calc non Af Amer: 44 mL/min — ABNORMAL LOW (ref >60)
GFR calc non Af Amer: >60 mL/min (ref >60)
GFR calc non Af Amer: >60 mL/min (ref >60)
Glucose, Bld: 100 mg/dL — ABNORMAL HIGH (ref 70–99)
Glucose, Bld: 109 mg/dL — ABNORMAL HIGH (ref 70–99)
Potassium: 4.7 mEq/L (ref 3.5–5.1)
Total Bilirubin: 0.9 mg/dL (ref 0.3–1.2)
Total Bilirubin: 0.6 mg/dL (ref 0.3–1.2)

## 2010-10-15 LAB — CK TOTAL AND CKMB (NOT AT ARMC)
Relative Index: INVALID (ref 0.0–2.5)
Total CK: 20 U/L (ref 7–232)

## 2010-10-15 LAB — BASIC METABOLIC PANEL
BUN: 15 mg/dL (ref 6–23)
BUN: 24 mg/dL — ABNORMAL HIGH (ref 6–23)
BUN: 15 mg/dL (ref 6–23)
CO2: 28 mEq/L (ref 19–32)
CO2: 26 mEq/L (ref 19–32)
CO2: 28 mEq/L (ref 19–32)
CO2: 29 mEq/L (ref 19–32)
Calcium: 8.9 mg/dL (ref 8.4–10.5)
Calcium: 9.6 mg/dL (ref 8.4–10.5)
Calcium: 8.8 mg/dL (ref 8.4–10.5)
Calcium: 9.1 mg/dL (ref 8.4–10.5)
Chloride: 93 mEq/L — ABNORMAL LOW (ref 96–112)
Chloride: 98 mEq/L (ref 96–112)
Creatinine, Ser: 1.06 mg/dL (ref 0.4–1.5)
Creatinine, Ser: 1.42 mg/dL (ref 0.4–1.5)
Creatinine, Ser: 0.99 mg/dL (ref 0.4–1.5)
GFR calc Af Amer: 50 mL/min — ABNORMAL LOW (ref >60)
GFR calc Af Amer: >60 mL/min (ref >60)
GFR calc Af Amer: >60 mL/min (ref >60)
GFR calc non Af Amer: 59 mL/min — ABNORMAL LOW (ref >60)
GFR calc non Af Amer: >60 mL/min (ref >60)
Glucose, Bld: 86 mg/dL (ref 70–99)
Glucose, Bld: 94 mg/dL (ref 70–99)
Glucose, Bld: 105 mg/dL — ABNORMAL HIGH (ref 70–99)
Glucose, Bld: 84 mg/dL (ref 70–99)
Glucose, Bld: 97 mg/dL (ref 70–99)
Potassium: 4.6 mEq/L (ref 3.5–5.1)
Sodium: 129 mEq/L — ABNORMAL LOW (ref 135–145)
Sodium: 132 mEq/L — ABNORMAL LOW (ref 135–145)
Sodium: 135 mEq/L (ref 135–145)
Sodium: 135 mEq/L (ref 135–145)

## 2010-10-15 LAB — AFB CULTURE WITH SMEAR (NOT AT ARMC): Acid Fast Smear: NONE SEEN

## 2010-10-15 LAB — DIFFERENTIAL
Basophils Absolute: 0 10*3/uL (ref 0.0–0.1)
Basophils Absolute: 0 10*3/uL (ref 0.0–0.1)
Basophils Absolute: 0 10*3/uL (ref 0.0–0.1)
Basophils Absolute: 0 10*3/uL (ref 0.0–0.1)
Basophils Absolute: 0 10*3/uL (ref 0.0–0.1)
Basophils Absolute: 0 10*3/uL (ref 0.0–0.1)
Basophils Absolute: 0 10*3/uL (ref 0.0–0.1)
Basophils Absolute: 0 K/uL (ref 0.0–0.1)
Basophils Absolute: 0 K/uL (ref 0.0–0.1)
Basophils Absolute: 0 K/uL (ref 0.0–0.1)
Basophils Relative: 0 % (ref 0–1)
Basophils Relative: 1 % (ref 0–1)
Basophils Relative: 1 % (ref 0–1)
Basophils Relative: 0 % (ref 0–1)
Basophils Relative: 0 % (ref 0–1)
Basophils Relative: 0 % (ref 0–1)
Eosinophils Absolute: 0.1 10*3/uL (ref 0.0–0.7)
Eosinophils Absolute: 0.1 10*3/uL (ref 0.0–0.7)
Eosinophils Absolute: 0.2 10*3/uL (ref 0.0–0.7)
Eosinophils Absolute: 0.2 10*3/uL (ref 0.0–0.7)
Eosinophils Absolute: 0.1 K/uL (ref 0.0–0.7)
Eosinophils Absolute: 0.1 K/uL (ref 0.0–0.7)
Eosinophils Absolute: 0.1 K/uL (ref 0.0–0.7)
Eosinophils Relative: 3 % (ref 0–5)
Eosinophils Relative: 3 % (ref 0–5)
Eosinophils Relative: 5 % (ref 0–5)
Eosinophils Relative: 5 % (ref 0–5)
Eosinophils Relative: 1 % (ref 0–5)
Eosinophils Relative: 1 % (ref 0–5)
Eosinophils Relative: 2 % (ref 0–5)
Eosinophils Relative: 2 % (ref 0–5)
Lymphocytes Relative: 16 % (ref 12–46)
Lymphocytes Relative: 20 % (ref 12–46)
Lymphocytes Relative: 22 % (ref 12–46)
Lymphocytes Relative: 22 % (ref 12–46)
Lymphocytes Relative: 22 % (ref 12–46)
Lymphocytes Relative: 26 % (ref 12–46)
Lymphocytes Relative: 13 % (ref 12–46)
Lymphocytes Relative: 13 % (ref 12–46)
Lymphocytes Relative: 15 % (ref 12–46)
Lymphocytes Relative: 17 % (ref 12–46)
Lymphocytes Relative: 8 % — ABNORMAL LOW (ref 12–46)
Lymphs Abs: 0.6 10*3/uL — ABNORMAL LOW (ref 0.7–4.0)
Lymphs Abs: 0.9 10*3/uL (ref 0.7–4.0)
Lymphs Abs: 0.9 10*3/uL (ref 0.7–4.0)
Lymphs Abs: 1 10*3/uL (ref 0.7–4.0)
Lymphs Abs: 0.5 10*3/uL — ABNORMAL LOW (ref 0.7–4.0)
Lymphs Abs: 0.6 K/uL — ABNORMAL LOW (ref 0.7–4.0)
Lymphs Abs: 0.7 10*3/uL (ref 0.7–4.0)
Lymphs Abs: 0.7 K/uL (ref 0.7–4.0)
Lymphs Abs: 0.8 K/uL (ref 0.7–4.0)
Monocytes Absolute: 0.2 10*3/uL (ref 0.1–1.0)
Monocytes Absolute: 0.3 10*3/uL (ref 0.1–1.0)
Monocytes Absolute: 0.3 10*3/uL (ref 0.1–1.0)
Monocytes Absolute: 0.3 10*3/uL (ref 0.1–1.0)
Monocytes Absolute: 0.3 K/uL (ref 0.1–1.0)
Monocytes Absolute: 0.4 K/uL (ref 0.1–1.0)
Monocytes Absolute: 0.5 K/uL (ref 0.1–1.0)
Monocytes Relative: 10 % (ref 3–12)
Monocytes Relative: 7 % (ref 3–12)
Monocytes Relative: 10 % (ref 3–12)
Monocytes Relative: 6 % (ref 3–12)
Monocytes Relative: 8 % (ref 3–12)
Neutro Abs: 2.6 10*3/uL (ref 1.7–7.7)
Neutro Abs: 2.8 10*3/uL (ref 1.7–7.7)
Neutro Abs: 3.3 K/uL (ref 1.7–7.7)
Neutro Abs: 3.6 K/uL (ref 1.7–7.7)
Neutro Abs: 3.8 K/uL (ref 1.7–7.7)
Neutro Abs: 4 10*3/uL (ref 1.7–7.7)
Neutro Abs: 5 10*3/uL (ref 1.7–7.7)
Neutrophils Relative %: 59 % (ref 43–77)
Neutrophils Relative %: 66 % (ref 43–77)
Neutrophils Relative %: 69 % (ref 43–77)
Neutrophils Relative %: 73 % (ref 43–77)
Neutrophils Relative %: 71 % (ref 43–77)
Neutrophils Relative %: 73 % (ref 43–77)
Neutrophils Relative %: 77 % (ref 43–77)
Neutrophils Relative %: 78 % — ABNORMAL HIGH (ref 43–77)
Neutrophils Relative %: 80 % — ABNORMAL HIGH (ref 43–77)

## 2010-10-15 LAB — ANTI-NEUTROPHIL ANTIBODY

## 2010-10-15 LAB — HIV 1/2 CONFIRMATION
HIV-1 antibody: POSITIVE
HIV-2 Ab: NEGATIVE

## 2010-10-15 LAB — CBC
HCT: 30.5 % — ABNORMAL LOW (ref 39.0–52.0)
HCT: 31 % — ABNORMAL LOW (ref 39.0–52.0)
HCT: 32.2 % — ABNORMAL LOW (ref 39.0–52.0)
HCT: 30.2 % — ABNORMAL LOW (ref 39.0–52.0)
HCT: 31.2 % — ABNORMAL LOW (ref 39.0–52.0)
HCT: 31.8 % — ABNORMAL LOW (ref 39.0–52.0)
HCT: 33.8 % — ABNORMAL LOW (ref 39.0–52.0)
Hemoglobin: 10 g/dL — ABNORMAL LOW (ref 13.0–17.0)
Hemoglobin: 10.5 g/dL — ABNORMAL LOW (ref 13.0–17.0)
Hemoglobin: 9.9 g/dL — ABNORMAL LOW (ref 13.0–17.0)
Hemoglobin: 10 g/dL — ABNORMAL LOW (ref 13.0–17.0)
Hemoglobin: 10.4 g/dL — ABNORMAL LOW (ref 13.0–17.0)
Hemoglobin: 10.6 g/dL — ABNORMAL LOW (ref 13.0–17.0)
Hemoglobin: 9.8 g/dL — ABNORMAL LOW (ref 13.0–17.0)
Hemoglobin: 9.8 g/dL — ABNORMAL LOW (ref 13.0–17.0)
MCHC: 32.5 g/dL (ref 30.0–36.0)
MCHC: 32.6 g/dL (ref 30.0–36.0)
MCHC: 32.6 g/dL (ref 30.0–36.0)
MCHC: 31.7 g/dL (ref 30.0–36.0)
MCHC: 32.2 g/dL (ref 30.0–36.0)
MCHC: 32.5 g/dL (ref 30.0–36.0)
MCHC: 32.5 g/dL (ref 30.0–36.0)
MCHC: 32.6 g/dL (ref 30.0–36.0)
MCV: 77.8 fL — ABNORMAL LOW (ref 78.0–100.0)
MCV: 78.4 fL (ref 78.0–100.0)
MCV: 78.7 fL (ref 78.0–100.0)
MCV: 79.4 fL (ref 78.0–100.0)
MCV: 78.3 fL (ref 78.0–100.0)
MCV: 78.4 fL (ref 78.0–100.0)
MCV: 78.4 fL (ref 78.0–100.0)
MCV: 78.5 fL (ref 78.0–100.0)
MCV: 78.8 fL (ref 78.0–100.0)
Platelets: 287 10*3/uL (ref 150–400)
Platelets: 311 10*3/uL (ref 150–400)
Platelets: 323 10*3/uL (ref 150–400)
Platelets: 264 10*3/uL (ref 150–400)
Platelets: 311 K/uL (ref 150–400)
Platelets: 356 10*3/uL (ref 150–400)
RBC: 3.87 MIL/uL — ABNORMAL LOW (ref 4.22–5.81)
RBC: 3.93 MIL/uL — ABNORMAL LOW (ref 4.22–5.81)
RBC: 3.98 MIL/uL — ABNORMAL LOW (ref 4.22–5.81)
RBC: 4.08 MIL/uL — ABNORMAL LOW (ref 4.22–5.81)
RBC: 3.82 MIL/uL — ABNORMAL LOW (ref 4.22–5.81)
RBC: 3.85 MIL/uL — ABNORMAL LOW (ref 4.22–5.81)
RBC: 3.95 MIL/uL — ABNORMAL LOW (ref 4.22–5.81)
RBC: 4.31 MIL/uL (ref 4.22–5.81)
RDW: 13.1 % (ref 11.5–15.5)
RDW: 13.2 % (ref 11.5–15.5)
RDW: 13.5 % (ref 11.5–15.5)
RDW: 13.6 % (ref 11.5–15.5)
RDW: 13.5 % (ref 11.5–15.5)
RDW: 13.5 % (ref 11.5–15.5)
RDW: 13.5 % (ref 11.5–15.5)
RDW: 13.6 % (ref 11.5–15.5)
WBC: 3.5 10*3/uL — ABNORMAL LOW (ref 4.0–10.5)
WBC: 3.7 10*3/uL — ABNORMAL LOW (ref 4.0–10.5)
WBC: 3.8 10*3/uL — ABNORMAL LOW (ref 4.0–10.5)
WBC: 4.2 10*3/uL (ref 4.0–10.5)
WBC: 4.5 10*3/uL (ref 4.0–10.5)
WBC: 4.7 10*3/uL (ref 4.0–10.5)
WBC: 4.9 K/uL (ref 4.0–10.5)
WBC: 6.2 10*3/uL (ref 4.0–10.5)

## 2010-10-15 LAB — UIFE/LIGHT CHAINS/TP QN, 24-HR UR
Albumin, U: DETECTED
Alpha 1, Urine: DETECTED — AB
Alpha 2, Urine: DETECTED — AB
Beta, Urine: DETECTED — AB
Beta, Urine: DETECTED — AB
Free Kappa Lt Chains,Ur: 31.2 mg/dL — ABNORMAL HIGH (ref 0.04–1.51)
Free Kappa/Lambda Ratio: 7.52 ratio — ABNORMAL HIGH (ref 0.46–4.00)
Free Lambda Excretion/Day: 80.93 mg/d
Free Lambda Lt Chains,Ur: 4.15 mg/dL — ABNORMAL HIGH (ref 0.08–1.01)
Free Lambda Lt Chains,Ur: 5.49 mg/dL — ABNORMAL HIGH (ref 0.08–1.01)
Free Lt Chn Excr Rate: 608.4 mg/d
Free Lt Chn Excr Rate: 753.3 mg/d
Gamma Globulin, Urine: DETECTED — AB
Gamma Globulin, Urine: DETECTED — AB
Time: 24 h
Time: 24 h
Total Protein, Urine-Ur/day: 700 mg/d — ABNORMAL HIGH (ref 10–140)
Total Protein, Urine-Ur/day: 875 mg/d — ABNORMAL HIGH (ref 10–140)
Total Protein, Urine: 35.9 mg/dL
Volume, Urine: 1950 mL

## 2010-10-15 LAB — COMPREHENSIVE METABOLIC PANEL WITH GFR
ALT: 22 U/L (ref 0–53)
AST: 18 U/L (ref 0–37)
Albumin: 2.7 g/dL — ABNORMAL LOW (ref 3.5–5.2)
Alkaline Phosphatase: 106 U/L (ref 39–117)
BUN: 14 mg/dL (ref 6–23)
CO2: 26 meq/L (ref 19–32)
Calcium: 8.9 mg/dL (ref 8.4–10.5)
Chloride: 99 meq/L (ref 96–112)
Creatinine, Ser: 1.05 mg/dL (ref 0.4–1.5)
GFR calc non Af Amer: >60 mL/min
Glucose, Bld: 99 mg/dL (ref 70–99)
Potassium: 3.9 meq/L (ref 3.5–5.1)
Sodium: 132 meq/L — ABNORMAL LOW (ref 135–145)
Total Bilirubin: 0.8 mg/dL (ref 0.3–1.2)
Total Protein: 7 g/dL (ref 6.0–8.3)

## 2010-10-15 LAB — PROTEIN ELECTROPHORESIS, SERUM
Albumin ELP: 41.8 % — ABNORMAL LOW (ref 55.8–66.1)
Alpha-1-Globulin: 8 % — ABNORMAL HIGH (ref 2.9–4.9)
Alpha-2-Globulin: 13.3 % — ABNORMAL HIGH (ref 7.1–11.8)
Beta 2: 3.7 % (ref 3.2–6.5)
Beta Globulin: 4.2 % — ABNORMAL LOW (ref 4.7–7.2)
Gamma Globulin: 29 % — ABNORMAL HIGH (ref 11.1–18.8)
M-Spike, %: NOT DETECTED g/dL
Total Protein ELP: 7.1 g/dL (ref 6.0–8.3)

## 2010-10-15 LAB — URINALYSIS, ROUTINE W REFLEX MICROSCOPIC
Nitrite: NEGATIVE
Protein, ur: NEGATIVE mg/dL
Urobilinogen, UA: 1 mg/dL (ref 0.0–1.0)

## 2010-10-15 LAB — CULTURE, BLOOD (ROUTINE X 2)
Culture: NO GROWTH
Culture: NO GROWTH

## 2010-10-15 LAB — HIV-1 GENOTYPR PLUS

## 2010-10-15 LAB — NA AND K (SODIUM & POTASSIUM), RAND UR
Potassium Urine: 21 meq/L
Sodium, Ur: 35 meq/L

## 2010-10-15 LAB — OSMOLALITY, URINE: Osmolality, Ur: 491 mosm/kg (ref 390–1090)

## 2010-10-15 LAB — HIV-1 RNA ULTRAQUANT REFLEX TO GENTYP+
HIV 1 RNA Quant: 31100 copies/mL — ABNORMAL HIGH (ref <48)
HIV-1 RNA Quant, Log: 4.49 {Log} — ABNORMAL HIGH (ref <1.68)

## 2010-10-15 LAB — APTT: aPTT: 30 seconds (ref 24–37)

## 2010-10-15 LAB — GC/CHLAMYDIA PROBE AMP, URINE
Chlamydia, Swab/Urine, PCR: NEGATIVE
GC Probe Amp, Urine: NEGATIVE

## 2010-10-15 LAB — WOUND CULTURE: Culture: NO GROWTH

## 2010-10-15 LAB — CARDIAC PANEL(CRET KIN+CKTOT+MB+TROPI)
Relative Index: INVALID (ref 0.0–2.5)
Relative Index: INVALID (ref 0.0–2.5)
Relative Index: INVALID (ref 0.0–2.5)
Total CK: 29 U/L (ref 7–232)
Troponin I: 0.02 ng/mL (ref 0.00–0.06)

## 2010-10-15 LAB — BARTONELLA ANTIBODY PANEL
B henselae IgG: <1:64 {titer}
B henselae IgM: <1:16 {titer}

## 2010-10-15 LAB — RETICULOCYTES: Retic Count, Absolute: 34.4 10*3/uL (ref 19.0–186.0)

## 2010-10-15 LAB — RPR: RPR Ser Ql: REACTIVE — AB

## 2010-10-15 LAB — POCT I-STAT 3, ART BLOOD GAS (G3+)
Bicarbonate: 22.2 mEq/L (ref 20.0–24.0)
Patient temperature: 100.7
pCO2 arterial: 36.9 mmHg (ref 35.0–45.0)
pH, Arterial: 7.392 (ref 7.350–7.450)

## 2010-10-15 LAB — PROTEIN ELECTROPH W RFLX QUANT IMMUNOGLOBULINS
Alpha-1-Globulin: 8 % — ABNORMAL HIGH (ref 2.9–4.9)
Gamma Globulin: 28.7 % — ABNORMAL HIGH (ref 11.1–18.8)
M-Spike, %: NOT DETECTED g/dL

## 2010-10-15 LAB — BRAIN NATRIURETIC PEPTIDE: Pro B Natriuretic peptide (BNP): 93 pg/mL (ref 0.0–100.0)

## 2010-10-15 LAB — AFP TUMOR MARKER: AFP-Tumor Marker: <1.3 ng/mL (ref 0.0–8.0)

## 2010-10-15 LAB — FUNGUS CULTURE W SMEAR: Fungal Smear: NONE SEEN

## 2010-10-15 LAB — TROPONIN I: Troponin I: 0.02 ng/mL (ref 0.00–0.06)

## 2010-10-15 LAB — CEA: CEA: <0.5 ng/mL (ref 0.0–5.0)

## 2010-10-15 LAB — FERRITIN: Ferritin: 596 ng/mL — ABNORMAL HIGH (ref 22–322)

## 2010-10-15 LAB — SEDIMENTATION RATE: Sed Rate: 40 mm/hr — ABNORMAL HIGH (ref 0–16)

## 2010-10-15 LAB — TECHNOLOGIST SMEAR REVIEW

## 2010-10-15 LAB — HEPARIN LEVEL (UNFRACTIONATED): Heparin Unfractionated: 0.48 IU/mL (ref 0.30–0.70)

## 2010-10-15 LAB — MAGNESIUM: Magnesium: 1.9 mg/dL (ref 1.5–2.5)

## 2010-10-15 LAB — IRON AND TIBC
Iron: 40 ug/dL — ABNORMAL LOW (ref 42–135)
Saturation Ratios: 18 % — ABNORMAL LOW (ref 20–55)
TIBC: 218 ug/dL (ref 215–435)
UIBC: 178 ug/dL

## 2010-10-15 LAB — T-HELPER CELLS (CD4) COUNT (NOT AT ARMC): CD4 T Cell Abs: <10 uL — ABNORMAL LOW (ref 400–2700)

## 2010-10-15 LAB — HEPATITIS PANEL, ACUTE: Hep B C IgM: NEGATIVE

## 2010-10-15 LAB — FOLATE: Folate: 10 ng/mL

## 2010-10-15 LAB — OSMOLALITY: Osmolality: 284 mosm/kg (ref 275–300)

## 2010-10-20 ENCOUNTER — Telehealth: Payer: Self-pay | Admitting: *Deleted

## 2010-10-20 NOTE — Telephone Encounter (Signed)
Called patient to let him know that his Rx was called in and ready for pick up.

## 2010-10-21 ENCOUNTER — Other Ambulatory Visit: Payer: Self-pay | Admitting: *Deleted

## 2010-10-21 DIAGNOSIS — B20 Human immunodeficiency virus [HIV] disease: Secondary | ICD-10-CM

## 2010-10-21 MED ORDER — EMTRICITABINE-TENOFOVIR DF 200-300 MG PO TABS: 1.0000 | ORAL_TABLET | Freq: Every day | ORAL | Status: DC

## 2010-10-21 MED ORDER — RALTEGRAVIR POTASSIUM 400 MG PO TABS: 400.0000 mg | ORAL_TABLET | Freq: Two times a day (BID) | ORAL | Status: DC

## 2010-10-21 MED ORDER — SULFAMETHOXAZOLE-TMP DS 800-160 MG PO TABS: 1.0000 | ORAL_TABLET | ORAL | Status: DC

## 2010-10-21 MED ORDER — AZITHROMYCIN 600 MG PO TABS: 1200.0000 mg | ORAL_TABLET | ORAL | Status: DC

## 2010-10-22 ENCOUNTER — Encounter (HOSPITAL_BASED_OUTPATIENT_CLINIC_OR_DEPARTMENT_OTHER): Payer: Self-pay | Admitting: Oncology

## 2010-10-22 DIAGNOSIS — B20 Human immunodeficiency virus [HIV] disease: Secondary | ICD-10-CM

## 2010-10-22 DIAGNOSIS — Z452 Encounter for adjustment and management of vascular access device: Secondary | ICD-10-CM

## 2010-10-22 DIAGNOSIS — C467 Kaposi's sarcoma of other sites: Secondary | ICD-10-CM

## 2010-12-17 ENCOUNTER — Encounter: Payer: Self-pay | Admitting: Oncology

## 2011-01-26 ENCOUNTER — Encounter: Payer: Self-pay | Admitting: Cardiology

## 2011-02-18 ENCOUNTER — Encounter: Payer: Self-pay | Admitting: Oncology

## 2011-02-21 ENCOUNTER — Encounter (HOSPITAL_BASED_OUTPATIENT_CLINIC_OR_DEPARTMENT_OTHER): Payer: Self-pay | Admitting: Oncology

## 2011-02-21 ENCOUNTER — Other Ambulatory Visit: Payer: Self-pay | Admitting: Oncology

## 2011-02-21 DIAGNOSIS — Z5111 Encounter for antineoplastic chemotherapy: Secondary | ICD-10-CM

## 2011-02-21 DIAGNOSIS — C467 Kaposi's sarcoma of other sites: Secondary | ICD-10-CM

## 2011-02-21 DIAGNOSIS — B20 Human immunodeficiency virus [HIV] disease: Secondary | ICD-10-CM

## 2011-02-21 DIAGNOSIS — Z452 Encounter for adjustment and management of vascular access device: Secondary | ICD-10-CM

## 2011-02-21 LAB — CBC WITH DIFFERENTIAL/PLATELET
Basophils Absolute: 0 10*3/uL (ref 0.0–0.1)
Eosinophils Absolute: 0.6 10*3/uL — ABNORMAL HIGH (ref 0.0–0.5)
HCT: 46.7 % (ref 38.4–49.9)
HGB: 15.2 g/dL (ref 13.0–17.1)
MCH: 27.1 pg — ABNORMAL LOW (ref 27.2–33.4)
MCV: 83.5 fL (ref 79.3–98.0)
MONO%: 6.7 % (ref 0.0–14.0)
NEUT#: 2 10*3/uL (ref 1.5–6.5)
NEUT%: 44.4 % (ref 39.0–75.0)
RDW: 13.9 % (ref 11.0–14.6)
lymph#: 1.5 10*3/uL (ref 0.9–3.3)

## 2011-02-21 LAB — COMPREHENSIVE METABOLIC PANEL
Albumin: 4.5 g/dL (ref 3.5–5.2)
BUN: 13 mg/dL (ref 6–23)
Calcium: 9.9 mg/dL (ref 8.4–10.5)
Chloride: 102 mEq/L (ref 96–112)
Creatinine, Ser: 1.07 mg/dL (ref 0.50–1.35)
Glucose, Bld: 90 mg/dL (ref 70–99)
Potassium: 4.3 mEq/L (ref 3.5–5.3)

## 2011-02-22 ENCOUNTER — Ambulatory Visit (HOSPITAL_COMMUNITY)
Admission: RE | Admit: 2011-02-22 | Discharge: 2011-02-22 | Disposition: A | Discharge status: Home / Self Care | Payer: Self-pay | Source: Ambulatory Visit | Attending: Oncology | Admitting: Oncology

## 2011-02-22 ENCOUNTER — Encounter (HOSPITAL_COMMUNITY): Payer: Self-pay

## 2011-02-22 DIAGNOSIS — B20 Human immunodeficiency virus [HIV] disease: Secondary | ICD-10-CM | POA: Insufficient documentation

## 2011-02-22 DIAGNOSIS — C3490 Malignant neoplasm of unspecified part of unspecified bronchus or lung: Secondary | ICD-10-CM

## 2011-02-22 DIAGNOSIS — C7951 Secondary malignant neoplasm of bone: Secondary | ICD-10-CM | POA: Insufficient documentation

## 2011-02-22 DIAGNOSIS — Z9221 Personal history of antineoplastic chemotherapy: Secondary | ICD-10-CM | POA: Insufficient documentation

## 2011-02-22 DIAGNOSIS — N281 Cyst of kidney, acquired: Secondary | ICD-10-CM | POA: Insufficient documentation

## 2011-02-22 DIAGNOSIS — Q248 Other specified congenital malformations of heart: Secondary | ICD-10-CM | POA: Insufficient documentation

## 2011-02-22 DIAGNOSIS — C469 Kaposi's sarcoma, unspecified: Secondary | ICD-10-CM | POA: Insufficient documentation

## 2011-02-22 MED ORDER — IOHEXOL 300 MG/ML  SOLN
100.0000 mL | Freq: Once | INTRAMUSCULAR | Status: AC | PRN
  Administered 2011-02-22: 100 mL via INTRAVENOUS

## 2011-03-03 ENCOUNTER — Encounter (HOSPITAL_BASED_OUTPATIENT_CLINIC_OR_DEPARTMENT_OTHER): Payer: Self-pay | Admitting: Oncology

## 2011-03-03 DIAGNOSIS — Z452 Encounter for adjustment and management of vascular access device: Secondary | ICD-10-CM

## 2011-03-03 DIAGNOSIS — B20 Human immunodeficiency virus [HIV] disease: Secondary | ICD-10-CM

## 2011-03-03 DIAGNOSIS — C467 Kaposi's sarcoma of other sites: Secondary | ICD-10-CM

## 2011-03-26 ENCOUNTER — Emergency Department (HOSPITAL_COMMUNITY): Payer: Self-pay

## 2011-03-26 ENCOUNTER — Inpatient Hospital Stay (HOSPITAL_COMMUNITY)
Admission: EM | Admit: 2011-03-26 | Discharge: 2011-03-31 | DRG: 202 | Disposition: A | Discharge status: Home / Self Care | Payer: Self-pay | Attending: Internal Medicine | Admitting: Internal Medicine

## 2011-03-26 ENCOUNTER — Encounter: Payer: Self-pay | Admitting: Internal Medicine

## 2011-03-26 DIAGNOSIS — B20 Human immunodeficiency virus [HIV] disease: Secondary | ICD-10-CM

## 2011-03-26 DIAGNOSIS — E872 Acidosis, unspecified: Secondary | ICD-10-CM | POA: Diagnosis present

## 2011-03-26 DIAGNOSIS — J45901 Unspecified asthma with (acute) exacerbation: Principal | ICD-10-CM | POA: Diagnosis present

## 2011-03-26 DIAGNOSIS — I10 Essential (primary) hypertension: Secondary | ICD-10-CM | POA: Diagnosis present

## 2011-03-26 DIAGNOSIS — Z79899 Other long term (current) drug therapy: Secondary | ICD-10-CM

## 2011-03-26 DIAGNOSIS — R000 Tachycardia, unspecified: Secondary | ICD-10-CM

## 2011-03-26 DIAGNOSIS — J069 Acute upper respiratory infection, unspecified: Secondary | ICD-10-CM | POA: Diagnosis present

## 2011-03-26 DIAGNOSIS — IMO0002 Reserved for concepts with insufficient information to code with codable children: Secondary | ICD-10-CM

## 2011-03-26 DIAGNOSIS — E0590 Thyrotoxicosis, unspecified without thyrotoxic crisis or storm: Secondary | ICD-10-CM | POA: Diagnosis present

## 2011-03-26 DIAGNOSIS — Z23 Encounter for immunization: Secondary | ICD-10-CM

## 2011-03-26 DIAGNOSIS — C469 Kaposi's sarcoma, unspecified: Secondary | ICD-10-CM | POA: Diagnosis present

## 2011-03-26 LAB — POCT I-STAT 3, ART BLOOD GAS (G3+)
Bicarbonate: 23.8 mEq/L (ref 20.0–24.0)
TCO2: 25 mmol/L (ref 0–100)
pCO2 arterial: 52.3 mmHg — ABNORMAL HIGH (ref 35.0–45.0)
pH, Arterial: 7.266 — ABNORMAL LOW (ref 7.350–7.450)

## 2011-03-26 LAB — PROTIME-INR
INR: 1.03 (ref 0.00–1.49)
Prothrombin Time: 13.7 seconds (ref 11.6–15.2)

## 2011-03-26 LAB — CBC
HCT: 41.6 % (ref 39.0–52.0)
MCV: 77 fL — ABNORMAL LOW (ref 78.0–100.0)
Platelets: 171 10*3/uL (ref 150–400)
RBC: 5.4 MIL/uL (ref 4.22–5.81)
WBC: 9.1 10*3/uL (ref 4.0–10.5)

## 2011-03-26 LAB — DIFFERENTIAL
Basophils Absolute: 0 10*3/uL (ref 0.0–0.1)
Lymphocytes Relative: 21 % (ref 12–46)
Lymphs Abs: 1.9 10*3/uL (ref 0.7–4.0)
Neutrophils Relative %: 65 % (ref 43–77)

## 2011-03-26 LAB — POCT I-STAT, CHEM 8
Chloride: 106 mEq/L (ref 96–112)
HCT: 48 % (ref 39.0–52.0)
Potassium: 4.4 mEq/L (ref 3.5–5.1)
Sodium: 143 mEq/L (ref 135–145)

## 2011-03-26 LAB — POCT I-STAT TROPONIN I

## 2011-03-26 LAB — APTT: aPTT: 35 seconds (ref 24–37)

## 2011-03-26 NOTE — H&P (Signed)
Hospital Admission Note Date: 03/26/2011  Patient name: Steven [name] Medical record number: [id number] Date of birth: 1981/01/16 Age: 30 y.o. Gender: male PCP: Johny Sax, MD, MD  Medical Service: IM Teaching Service  Attending physician: Dr. Phillips Odor Resident (R2): Dr. Anselm Jungling    Pager: [phone] Resident (R1): Dr. Dorise Hiss    Pager: [phone]  Chief Complaint: cough, SOB  History of Present Illness: Patient is a 30 yo man with PMH of HIV (Last CD4 = 90, Aug 2011), Kaposi sarcoma undergoing chemotherapy, HTN & Asthma who presents with cough & SOB for 4 days.  He notes that 4 days prior to admission, he began to develop a cold with complaints of cough with green/clear sputum production, nasal congestion, and generalized headache.  He reports that he was febrile with an oral temperature of 100.1 that was responsive to tylenol.  He tried over the counter mucinex for his cough, and he notes that he was getting better until the morning of admission, when he all of a sudden he felt worse.  He tried albuterol on the morning of admission with no relief.  Regarding his asthma, he notes he was diagnosed about 10 years ago, and uses albuterol for relief.  There is no record of asthma upon chart review, and he notes he receives albuterol from the ED.  He was last admitted 1 year ago for a similar problem.  He notes that his symptoms of asthma worsen in the heat or if he gets a cold.  He reports that his SOB is worse with exertion.  He has recently been around his neighbor who also has a cold.    He notes that breathing treatment in the ED alleviated his symptoms, and he currently denies SOB, but still has a clear sputum producing cough.  He denies CP/vomiting/diarrhea/constipation.    Meds: Current Outpatient Prescriptions  Medication Sig Dispense Refill  . azithromycin (ZITHROMAX) 600 MG tablet Take 2 tablets (1,200 mg total) by mouth every 7 (seven) days. Take 2 tabs by mouth every 7 days  10 tablet  11  .  clotrimazole (MYCELEX) 10 MG troche Take 10 mg by mouth 5 (five) times daily. Suck on one for 20 minutes five times a day . Do this 5 times a day for 7-14 days       . emtricitabine-tenofovir (TRUVADA) 200-300 MG per tablet Take 1 tablet by mouth daily.  30 tablet  11  . loratadine (CLARITIN) 10 MG tablet Take 10 mg by mouth daily.        . raltegravir (ISENTRESS) 400 MG tablet Take 1 tablet (400 mg total) by mouth 2 (two) times daily.  60 tablet  11  . Skin Protectants, Misc. (EUCERIN) cream Apply topically as needed.        . sulfamethoxazole-trimethoprim (BACTRIM DS) 800-160 MG per tablet Take 1 tablet by mouth 3 (three) times a week. one tab by mouth every Monday, Wednesday & friday  30 tablet  6    Allergies: Penicillins: rash, hives Aspirin: stomach discomfort  Past Medical History  Diagnosis Date  . HIV positive 03/23/09    Genotype [id number]  . Kaposi's sarcoma   . HTN (hypertension)   . Syphilis 03/23/09-    1:2   Past Surgical History: craniotomy and evacuation of the right hemispheric subdural        hematoma on August 26 and left hemispheric subdural hematoma on        March 12, 2010.   Family History  Problem Relation  Age of Onset  . Pancreatic cancer Mother 72   History   Social History  . Marital Status: Single    Spouse Name: N/A    Number of Children: N/A  . Years of Education: N/A   Occupational History  . 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 Smoking status: Never Smoker   . Alcohol Use: Occassional  . Drug Use: No   Review of Systems: ROS: General: no changes in weight, changes in appetite Skin: no rash HEENT: no blurry vision, hearing changes, sore throat Pulm: positive for dyspnea, coughing, wheezing CV: no chest pain, palpitations, positive for shortness of breath Abd: no abdominal pain, nausea/vomiting, diarrhea/constipation GU: no dysuria, hematuria,  polyuria Ext: no arthralgias, myalgias Neuro: no weakness, numbness, or tingling  Physical Exam: Vitals: T:97.6   HR: 126   BP:143/59   RR:20   O2 saturation: 95% on 2.5L [place] General: resting in bed, in mild distress with coughing HEENT: PERRL, EOMI, no scleral icterus, no conjunctival pallor or injection Cardiac: tachycardic, no rubs, murmurs or gallops Pulm: significant b/l wheezing Abd: soft, nontender, nondistended, BS normoactive, no palpable masses Ext: warm and well perfused, b/l lower extremity taut, no pitting edema Neuro: alert and oriented X3, cranial nerves II-XII grossly intact, strength equal in bilateral upper and lower extremities  Lab results: I-STAT TCO2                                     23                0-100            mmol/L  Ionized Calcium                          1.24              1.12-1.32        mmol/L  Hemoglobin (HGB)                         16.3              13.0-17.0        g/dL  Hematocrit (HCT)                         48.0              39.0-52.0        %  Sodium (NA)                              143               135-145          mEq/L  Potassium (K)                            4.4               3.5-5.1          mEq/L  Chloride  106               96-112           mEq/L  Glucose                                  112        h      70-99            mg/dL  BUN                                      16                6-23             mg/dL  Creatinine                               1.00              0.50-1.35        mg/dL  CBC with Diff  WBC                                      9.1               4.0-10.5         K/uL  RBC                                      5.40              4.22-5.81        MIL/uL  Hemoglobin (HGB)                         14.1              13.0-17.0        g/dL  Hematocrit (HCT)                         41.6              39.0-52.0        %  MCV                                      77.0       l      78.0-100.0        fL  MCH -                                    26.1              26.0-34.0        pg  MCHC  33.9              30.0-36.0        g/dL  RDW                                      12.4              11.5-15.5        %  Platelet Count (PLT)                     171               150-400          K/uL  Neutrophils, %                           65                43-77            %  Lymphocytes, %                           21                12-46            %  Monocytes, %                             9                 3-12             %  Eosinophils, %                           4                 0-5              %  Basophils, %                             0                 0-1              %  Neutrophils, Absolute                    6.0               1.7-7.7          K/uL  Lymphocytes, Absolute                    1.9               0.7-4.0          K/uL  Monocytes, Absolute                      0.8               0.1-1.0          K/uL  Eosinophils, Absolute  0.4               0.0-0.7          K/uL  Basophils, Absolute                      0.0               0.0-0.1          K/uL  ABG pH, Blood Gas                            7.266      l      7.350-7.450  pCO2                                     52.3       h      35.0-45.0        mmHg  pO2, Blood Gas                           76.0       l      80.0-100.0       mmHg  Bicarbonate                              23.8              20.0-24.0        mEq/L  TCO2                                     25                0-100            mmol/L  Base Deficit                             4.0        h      0.0-2.0          mmol/L  Oxygen Saturation                        93.0                               %  Collection Site                          SEE NOTE.     RADIAL, ALLEN'S TEST ACCEPTABLE  Drawn By                                 RT  Sample Type                              ARTERIAL   Troponin I, POC  0.00              0.00-0.08        Ng/mL  Imaging results:  Dg Chest 2 View  03/26/2011  *RADIOLOGY REPORT*  Clinical Data: Asthma attack, history asthma, hypertension, HIV, Kaposi's sarcoma, hypertension  CHEST - 2 VIEW  Comparison: 03/14/2010  Findings: Right jugular Port-A-Cath stable, tip at [facility] Minimal enlargement of cardiac silhouette. Mediastinal contours and pulmonary vascularity normal. Lungs clear. No pleural effusion or pneumothorax. Peribronchial thickening.  IMPRESSION: Bronchitic changes without acute infiltrate.  Original Report Authenticated By: Lollie Marrow, M.D.   Other results: EKG: sinus tachycardia  Assessment & Plan by Problem: 30 y/o HIV positive man wwith h/o asthma, Kaposi Sarcoma ion chemoRx and diffuse metastasis presents with shortness of breath since this am.   1. Shortness of breath Given h/o cold for past couple of days, diffuse wheezing and h/o asthma, this is most likely an asthma exacerbation. But PE is also consideration given h./o active malignancy, lupus anticoagulant, tahcycardia and worsening of symptoms acutely today. PCP and CAP is unlikely given no infiltrates on CXr.  - Check respiratory cultures, gm stain, PCP PCR, LDH - Start treating asthma exacerbation with solumedrol 60 Qd iv and zopenex (because of tachycardia)/atrovent nebulizer. Peak flow meter at bedside for responce monitoring.  - CT angion chest to r/o PE.  - Monitor in SDU overnight given tachycardia and sever respiratory discomfort at this time. then transfer to telemetry in am if stable.   2. HIV - continue keppra, isenstress, truvada and prophylactic medications bactrim and azithromycin - check CD4 as patient has not followed up with Dr. Ninetta Lights in a year.   3. HTN - Patient does not remember what medications he is taking, will wait for med reconciliation - Hydralazine prn at this time for SBP >180 -Also giving NS iv fluid repletion at this time for insensible volume  losses at this time. May stop in am if respiratory distress resolved.   4. Lupus anticoagulant positive - check PT, PTT  5. Kaposi Sarcoma undergoing chemoRx.  - has port-A-cath in place.  - Has extensive osseus metastasis.  - tramadol for pain.   6. DVT PX - lovenox.   R3______________________________  (Dr. Scot Dock, [phone])   R1________________________________ (Dr. Milbert Coulter, [phone])  ATTENDING: I performed and/or observed a history and physical examination of the patient.  I discussed the case with the residents as noted and reviewed the residents' notes.  I agree with the findings and plan--please refer to the attending physician note for more details.  Signature________________________________  Printed Name_____________________________

## 2011-03-27 ENCOUNTER — Encounter (HOSPITAL_COMMUNITY): Payer: Self-pay | Admitting: Radiology

## 2011-03-27 LAB — COMPREHENSIVE METABOLIC PANEL
AST: 25 U/L (ref 0–37)
BUN: 16 mg/dL (ref 6–23)
CO2: 25 mEq/L (ref 19–32)
Calcium: 9.4 mg/dL (ref 8.4–10.5)
Creatinine, Ser: 0.85 mg/dL (ref 0.50–1.35)
GFR calc non Af Amer: >60 mL/min (ref >60)

## 2011-03-27 LAB — RAPID URINE DRUG SCREEN, HOSP PERFORMED
Amphetamines: NOT DETECTED
Barbiturates: NOT DETECTED
Opiates: POSITIVE — AB
Tetrahydrocannabinol: NOT DETECTED

## 2011-03-27 LAB — BLOOD GAS, ARTERIAL
Drawn by: 34779
O2 Content: 3 L/min
O2 Saturation: 93.6 %
Patient temperature: 98.6
pO2, Arterial: 76.3 mmHg — ABNORMAL LOW (ref 80.0–100.0)

## 2011-03-27 LAB — LACTATE DEHYDROGENASE: LDH: 210 U/L (ref 94–250)

## 2011-03-27 MED ORDER — IOHEXOL 300 MG/ML  SOLN
100.0000 mL | Freq: Once | INTRAMUSCULAR | Status: AC | PRN
  Administered 2011-03-27: 100 mL via INTRAVENOUS

## 2011-03-28 DIAGNOSIS — J45901 Unspecified asthma with (acute) exacerbation: Secondary | ICD-10-CM

## 2011-03-28 DIAGNOSIS — B20 Human immunodeficiency virus [HIV] disease: Secondary | ICD-10-CM

## 2011-03-28 DIAGNOSIS — R000 Tachycardia, unspecified: Secondary | ICD-10-CM

## 2011-03-28 LAB — CBC
Hemoglobin: 12.4 g/dL — ABNORMAL LOW (ref 13.0–17.0)
Platelets: 152 10*3/uL (ref 150–400)
RBC: 4.86 MIL/uL (ref 4.22–5.81)
WBC: 8.3 10*3/uL (ref 4.0–10.5)

## 2011-03-28 LAB — T-HELPER CELLS (CD4) COUNT (NOT AT ARMC)
CD4 % Helper T Cell: 5 % — ABNORMAL LOW (ref 33–55)
CD4 T Cell Abs: 30 uL — ABNORMAL LOW (ref 400–2700)

## 2011-03-28 LAB — TSH: TSH: 0.009 u[IU]/mL — ABNORMAL LOW (ref 0.350–4.500)

## 2011-03-29 DIAGNOSIS — B20 Human immunodeficiency virus [HIV] disease: Secondary | ICD-10-CM

## 2011-03-29 DIAGNOSIS — R000 Tachycardia, unspecified: Secondary | ICD-10-CM

## 2011-03-29 DIAGNOSIS — J45901 Unspecified asthma with (acute) exacerbation: Secondary | ICD-10-CM

## 2011-03-29 LAB — CBC
MCHC: 33.2 g/dL (ref 30.0–36.0)
Platelets: 165 10*3/uL (ref 150–400)
RDW: 12.9 % (ref 11.5–15.5)
WBC: 7.3 10*3/uL (ref 4.0–10.5)

## 2011-03-31 DIAGNOSIS — J45901 Unspecified asthma with (acute) exacerbation: Secondary | ICD-10-CM

## 2011-03-31 DIAGNOSIS — R000 Tachycardia, unspecified: Secondary | ICD-10-CM

## 2011-03-31 DIAGNOSIS — B20 Human immunodeficiency virus [HIV] disease: Secondary | ICD-10-CM

## 2011-04-03 NOTE — Discharge Summary (Signed)
Pt seen for asthma exacerbation/bronchitis. Discharged on inhalers, needs PCP to manage his asthma. Sees ID for HIV, has F/U with them. CD4 was 30 checked this admission. HIV genotyping sent so will hopefully be back by the time ID sees. Pt also has financial constraints and may not be able to afford medications. Would benefit from [facility] and [employer].

## 2011-04-04 LAB — HIV-1 RNA QUANT-NO REFLEX-BLD: HIV-1 RNA Quant, Log: <1.3 {Log} (ref <1.30)

## 2011-04-05 ENCOUNTER — Ambulatory Visit (INDEPENDENT_AMBULATORY_CARE_PROVIDER_SITE_OTHER): Payer: Self-pay | Admitting: Internal Medicine

## 2011-04-05 ENCOUNTER — Encounter: Payer: Self-pay | Admitting: Internal Medicine

## 2011-04-05 DIAGNOSIS — I10 Essential (primary) hypertension: Secondary | ICD-10-CM

## 2011-04-05 DIAGNOSIS — B20 Human immunodeficiency virus [HIV] disease: Secondary | ICD-10-CM

## 2011-04-05 DIAGNOSIS — E0590 Thyrotoxicosis, unspecified without thyrotoxic crisis or storm: Secondary | ICD-10-CM | POA: Insufficient documentation

## 2011-04-05 NOTE — Assessment & Plan Note (Signed)
TSH 0.009 and free T4- 2.52 during recent hospital admission. New diagnosis of hyperthyroidism. Needs radioactive iodine ablation. Endocrinology appointment tried during hospital admission-discharge summary stating that he was faxed and patient will get call. Today when Debbie called Dr. Daune Perch clinic they were unable to find any papers as of today. He has no insurance or orange card as of now. He is going to apply for orange card today. Once he gets orange card we will refer him to endocrinology to

## 2011-04-05 NOTE — Assessment & Plan Note (Signed)
Recent quick drop in CD4 count. Appointment with Dr. Ninetta Lights on 04/11/2011 for further management.

## 2011-04-05 NOTE — Assessment & Plan Note (Signed)
Blood pressure much better than during hospital stay. Started on Coreg for symptomatically to of tachycardia and hyperthyroidism. No change in medication today.

## 2011-04-05 NOTE — Progress Notes (Signed)
Addended by: Lyn Hollingshead C on: 04/05/2011 02:47 PM   Modules accepted: Orders

## 2011-04-05 NOTE — Progress Notes (Signed)
  Subjective:    Patient ID: Steven [name], male    DOB: [birth year], 30 y.o.   MRN: [id number]  HPI Mr. Choquette is a pleasant 30 year old man with past with history of HIV with recent CD4 count of 30 in September 2012, HTN who comes to the clinic for a hospital followup visit. He was admitted from 03/26/2011 to 03/31/2011 for acute asthma exacerbation. His breathing is much improved today and has no shortness of breath or wheezing. He was sent home on Levaquin for 5 days and prednisone taper. He was also diagnosed with hyperthyroidism during this admission and referral for endocrinology with Dr. Sharl Ma was made- patient was supposed to receive a call from Dr. Daune Perch office - which he did not get. Apparently he does not have any insurance now. Debbie called Dr. Daune Perch office- so did not receive any papers from hospital discharge yet-and also didn't have an appointment until November.  Today he denies any more palpitations, tremors, diarrhea, increased swelling, chest pain, short of breath, abdominal pain, headache, nausea, vomiting.  He is to be followed up with Dr. Ninetta Lights to 04/11/2011  Review of Systems      as per history of present illness, all other systems reviewed and negative. Objective:   Physical Exam  Vitals: Reviewed General: NAD HEENT: PERRL, EOMI, no scleral icterus Cardiac: RRR, no rubs, murmurs or gallops Pulm: clear to auscultation bilaterally, moving normal volumes of air Abd: soft, nontender, nondistended, BS present Ext: warm and well perfused, no pedal edema Neuro: alert and oriented X3, cranial nerves II-XII grossly intact, strength and sensation to light touch equal in bilateral upper and lower extremities       Assessment & Plan:

## 2011-04-05 NOTE — Patient Instructions (Signed)
Please make a followup appointment if needed to our clinic for any medical problem-asthma, hypertension, thyroid problem. Please followup with endocrinology for further management of her hyperthyroid. Meanwhile keep taking Coreg for symptomatic management. Please followup with Dr. Ninetta Lights on 04/11/2011.

## 2011-04-11 ENCOUNTER — Encounter: Payer: Self-pay | Admitting: Infectious Diseases

## 2011-04-11 ENCOUNTER — Ambulatory Visit (INDEPENDENT_AMBULATORY_CARE_PROVIDER_SITE_OTHER): Payer: Self-pay | Admitting: Infectious Diseases

## 2011-04-11 DIAGNOSIS — B20 Human immunodeficiency virus [HIV] disease: Secondary | ICD-10-CM

## 2011-04-11 DIAGNOSIS — E0590 Thyrotoxicosis, unspecified without thyrotoxic crisis or storm: Secondary | ICD-10-CM

## 2011-04-11 DIAGNOSIS — C469 Kaposi's sarcoma, unspecified: Secondary | ICD-10-CM

## 2011-04-11 NOTE — Assessment & Plan Note (Signed)
Suspect that he has KS of his leg. As well suspect that he has bone marrow involvement. He has onc f/u this month. He is expectant that he will get more chemo at that time.

## 2011-04-11 NOTE — Progress Notes (Signed)
  Subjective:    Patient ID: Steven [name], male    DOB: 1981/04/27, 30 y.o.   MRN: [id number]  HPI 30 yo M with AIDS, KS, prev CNS bleed due to thrombocytopenia from CTX, and recent adm to [facility] with SOB, asthma exacerbation.  He was also diagnosed with hyperthyroidism. Getting swelling in his legs again, R>L. Feels like his legs are tired all the time. Tried to go back to work but has been unable to do it due to his low energy.  CD4 30 and VL <20 (Sept 2012).    Review of Systems     Objective:   Physical Exam  Constitutional: He appears well-developed and well-nourished.  Eyes: EOM are normal. Pupils are equal, round, and reactive to light.  Neck: Neck supple. Thyromegaly present.  Cardiovascular: Normal rate, regular rhythm and normal heart sounds.   Pulmonary/Chest: Effort normal and breath sounds normal.  Abdominal: Soft. Bowel sounds are normal. There is no tenderness.  Musculoskeletal: He exhibits edema.       He has massive RLE edema, non-pitting.   Lymphadenopathy:    He has no cervical adenopathy.          Assessment & Plan:

## 2011-04-11 NOTE — Assessment & Plan Note (Signed)
My great appreciation to the B service and Dr Allena Katz in getting him into endocrinology.

## 2011-04-11 NOTE — Assessment & Plan Note (Signed)
Pt and i spoke at length- there are several variables which could lower his CD4- his ongoing KS and his acute illness (during which his CD4 was drawn top the list. He has f/u with oncology this month. Will continue his bactrim and azithro. Will not change his ART as he is suppressed right now. Will see him back in 1 month.

## 2011-04-18 NOTE — Discharge Summary (Signed)
Steven [name], Steven [name]              ACCOUNT NO.:  [id number]  MEDICAL RECORD NO.:  [id number]  LOCATION:  5122                         FACILITY:  [facility]  PHYSICIAN:  Edsel Petrin, D.O.DATE OF BIRTH:  07/02/1981  DATE OF ADMISSION:  03/26/2011 DATE OF DISCHARGE:  03/31/2011                              DISCHARGE SUMMARY   DISCHARGE DIAGNOSES: 1. Shortness of breath secondary to asthma exacerbation versus     bronchitis. 2. Hyperthyroidism. 3. Hypertension. 4. Human immunodeficiency virus infection. 5. Kaposi sarcoma.  DISCHARGE MEDICATIONS: 1. Albuterol inhaler one puff inhaled as needed. 2. Carvedilol 12.5 mg by mouth twice daily. 3. Advair/fluticasone/salmeterol one puff inhaled twice daily. 4. Levaquin 700 mg by mouth daily to take for 5 days. 5. Prednisone 20 mg by mouth to take as three pills one time a day for     1 week, then two pills one time a day for 1 week, then one pill per     day for 1 week, then stop. 6. Azithromycin 600 mg by mouth to be taken weekly on Saturdays. 7. Isentress 500 mg one tablet by mouth twice daily. 8. Truvada one tablet by mouth daily. 9. Tylenol Extra Strength 500 mg two to three tablets by mouth daily. 10.Bactrim Double Strength tablets by mouth every Monday, Wednesday,     and Friday.  DISPOSITION AND FOLLOWUP:  The patient has an appointment on September 25 at 10:15 a.m. at [facility] with Dr. Lyn Hollingshead.  On this appointment, the patient will be evaluated for progress of his symptoms of shortness of breath.  The patient will also be encouraged to attend his followup for hyperthyroidism by Dr. Sharl Ma of [facility].  Also on this appointment, the patient's blood pressure will be assessed, and any changes in his hypertensive medications kindly be made, but surely considering adding another hypertensive if carvedilol is not controlling his blood pressure.  The patient also another  appointment with Dr. Ninetta Lights on October 1 at 11 a.m., and on this appointment, the patient will be evaluated for his HIV infection.  The patient's CD count was low during this hospitalization. The CD4 was 30 cells/mL and T cell percentage 30%.  We had done an order for genotyping, but this could not be done during this hospitalization since typically genotyping would be done after viral load, so these tests were not done.  The patient has been referred to [facility], but no appointment has been set already.  His information including the discharge summary and other documents should be forwarded to [facility] End[facility], and the patient will be contacted by this Practice for his appointment by Dr. Talmage Coin.  PROCEDURES PERFORMED:  None.  CONSULTATIONS:  None.  BRIEF HISTORY AND PHYSICAL:  The patient is a 30 year old male with a past medical history significant for HIV, Kaposi sarcoma, hypertension, and asthma.  The patient presented with shortness of breath for 4 days. He reported that the symptoms started 4 days prior to admission and which was proceeded by a cold with complaints of cough and green sputum production, nasal congestion, and generalized headache.  The patient was also febrile at home with a temperature  of 101, for which he took Tylenol.  Regarding his asthma, he notes that he was diagnosed about 10 years ago, and he had been using albuterol for relief intermittently. He had been admitted 1 year ago for similar episode.  The patient notes that his symptoms of asthma usually worsened during cold whether.  The patient had been around a neighbor who had a common cold.  Review of other symptom was mainly unremarkable except for chronic lower limb swelling associated with Kaposi sarcoma.  PHYSICAL EXAMINATION:  GENERAL:  The patient was having difficulty in breathing, which was very obvious.  The patient was resting in the bed with mild distress and  repetitive coughing. VITAL SIGNS:  Temperature was 97.6, respiratory rate was 20, saturating at 95% on 2.5 L by nasal cannula, heart rate 126, blood pressure 143/59. LUNGS:  Systemic exam was remarkable for significant bilateral wheezing on chest auscultation. EXTREMITIES:  Warm and well perfused but had bilateral lower limb edema, nonpitting.  The patient had no other significant findings.  HOSPITAL COURSE BY PROBLEM: 1. Shortness of breath.  The patient improved in terms of his     shortness of breath after he was started on Advair, salmeterol,     Atrovent,  and Xopenex inhalers as a treatment for acute     exacerbation of asthma.  The patient was also started on IV     antibiotics including ceftriaxone and azithromycin since bronchitis     could not be excluded.  The patient's symptoms improved over the     course of his stay in the hospital, and the wheezing on     auscultation appears to be decreasing.  However, the patient had     reactive with albuterol which had been given prior to switching to     Xopenex and his tremors well controlled by Ativan.  The patient by     the time of discharge was able to ambulate and was breathing     better.  The coughing had significantly improved. 2. Hyperthyroidism.  Because of the concerning history of tremors and     heat intolerance together with hyperphagia, the patient was     evaluated for hyperthyroidism and the TSH was very low at 0.009     together with a high T4 levels of 2.52.  The patient however was     prior to this admission known to have hyperthyroidism, but this was     a new diagnosis made during his admission.  The patient had been     scheduled to have a radioiodine uptake test, however, this was not     done because the patient had been exposed to contrast in the     previous six weeks.  Therefore, this test was deferred to be done     later as referred up with his endocrinologist.  The patient also     had a high heart  rate during admission and therefore he was started     on carvedilol to control the tachycardia and also to control other     symptoms of hyperthyroidism.  At the time of discharge, his heart     rate had reduced from 130s to 77.  The patient's thyroid was     examined and was found to be slightly enlarged, but it was     nontender.  Dr. Sharl Ma will be following this patient for this     condition and nothing was done.  During his  appointment probably he     will be scheduled to have this test that was not done during this     hospital that is the radioactive thyroid uptake test. 3. Hypertension.  The patient has been known to have hypertension and     was using clonidine at home.  However, during this admission, he     was started on Coreg, and at the time of discharge, his blood     pressure was 162/106. 4. HIV infection.  The patient has been on antiretroviral treatment     for long time and is being followed up by Dr. Ninetta Lights.  The CD4     that was done during this admission was found to be low at 30     cells/mL.  The patient had been scheduled to have genotyping done     for his HIV infection, but this could not be done because typically     it would require doing a viral load first.  The patient will follow     up with Dr. Ninetta Lights for this problem and no changes were made in     his treatment during his admission. 5. Kaposi sarcoma.  The patient has history of Kaposi sarcoma with     bilateral lower extremity nonpitting edema.  The patient has been     fully evaluated for this and had been treated by Dr. Welton Flakes with     chemotherapy for 1 year and records from Dr. Welton Flakes indicated that     his disease was stable.  Dr. Welton Flakes recommended the patient will     follow up with her in a period of 1 year to assess for this Kaposi     sarcoma.  DISCHARGE VITALS:  Temperature 98.1, pulse 77, respirations 18, blood pressure 162/106, oxygen saturation 95 on room air.  DISCHARGE LABS:  White blood  cell count 7.3, hemoglobin 12.7, hematocrit 38.2, platelets 106.    ______________________________ Genella Mech, MD   ______________________________ Edsel Petrin, D.O.    EK/MEDQ  D:  04/01/2011  T:  04/01/2011  Job:  [id number]  cc:   Lyn Hollingshead, MD Lacretia Leigh. Ninetta Lights, M.D.  Electronically Signed by Genella Mech MD on 04/12/2011 07:20:54 PM Electronically Signed by Anderson Malta D.O. on 04/18/2011 04:57:15 PM

## 2011-04-19 ENCOUNTER — Other Ambulatory Visit (HOSPITAL_COMMUNITY): Payer: Self-pay | Admitting: Endocrinology

## 2011-04-19 DIAGNOSIS — E0590 Thyrotoxicosis, unspecified without thyrotoxic crisis or storm: Secondary | ICD-10-CM

## 2011-04-19 LAB — HIV-1 GENOTYPR PLUS

## 2011-05-06 ENCOUNTER — Other Ambulatory Visit: Payer: Self-pay | Admitting: Licensed Clinical Social Worker

## 2011-05-06 DIAGNOSIS — I10 Essential (primary) hypertension: Secondary | ICD-10-CM

## 2011-05-06 DIAGNOSIS — B20 Human immunodeficiency virus [HIV] disease: Secondary | ICD-10-CM

## 2011-05-06 DIAGNOSIS — Z21 Asymptomatic human immunodeficiency virus [HIV] infection status: Secondary | ICD-10-CM

## 2011-05-06 MED ORDER — EMTRICITABINE-TENOFOVIR DF 200-300 MG PO TABS: 1.0000 | ORAL_TABLET | Freq: Every day | ORAL | Status: DC

## 2011-05-06 MED ORDER — AZITHROMYCIN 600 MG PO TABS: 1200.0000 mg | ORAL_TABLET | ORAL | Status: DC

## 2011-05-06 MED ORDER — SULFAMETHOXAZOLE-TMP DS 800-160 MG PO TABS: 1.0000 | ORAL_TABLET | ORAL | Status: DC

## 2011-05-06 MED ORDER — RALTEGRAVIR POTASSIUM 400 MG PO TABS: 400.0000 mg | ORAL_TABLET | Freq: Two times a day (BID) | ORAL | Status: DC

## 2011-05-06 MED ORDER — CARVEDILOL 12.5 MG PO TABS: 12.5000 mg | ORAL_TABLET | Freq: Two times a day (BID) | ORAL | Status: DC

## 2011-05-09 ENCOUNTER — Encounter (HOSPITAL_COMMUNITY)
Admission: RE | Admit: 2011-05-09 | Discharge: 2011-05-09 | Disposition: A | Discharge status: Home / Self Care | Payer: Self-pay | Source: Ambulatory Visit | Attending: Endocrinology | Admitting: Endocrinology

## 2011-05-09 DIAGNOSIS — E0590 Thyrotoxicosis, unspecified without thyrotoxic crisis or storm: Secondary | ICD-10-CM | POA: Insufficient documentation

## 2011-05-10 ENCOUNTER — Ambulatory Visit (HOSPITAL_COMMUNITY)
Admission: RE | Admit: 2011-05-10 | Discharge: 2011-05-10 | Disposition: A | Discharge status: Home / Self Care | Payer: Self-pay | Source: Ambulatory Visit | Attending: Endocrinology | Admitting: Endocrinology

## 2011-05-10 DIAGNOSIS — R946 Abnormal results of thyroid function studies: Secondary | ICD-10-CM | POA: Insufficient documentation

## 2011-05-10 DIAGNOSIS — E0590 Thyrotoxicosis, unspecified without thyrotoxic crisis or storm: Secondary | ICD-10-CM | POA: Insufficient documentation

## 2011-05-10 MED ORDER — SODIUM IODIDE I 131 CAPSULE
8.0000 | Freq: Once | INTRAVENOUS | Status: AC | PRN
  Administered 2011-05-10: 8 via ORAL

## 2011-05-18 ENCOUNTER — Other Ambulatory Visit (HOSPITAL_COMMUNITY): Payer: Self-pay | Admitting: Endocrinology

## 2011-05-18 DIAGNOSIS — E0590 Thyrotoxicosis, unspecified without thyrotoxic crisis or storm: Secondary | ICD-10-CM

## 2011-05-20 ENCOUNTER — Encounter (HOSPITAL_COMMUNITY)
Admission: RE | Admit: 2011-05-20 | Discharge: 2011-05-20 | Disposition: A | Discharge status: Home / Self Care | Payer: Self-pay | Source: Ambulatory Visit | Attending: Endocrinology | Admitting: Endocrinology

## 2011-05-20 DIAGNOSIS — E0590 Thyrotoxicosis, unspecified without thyrotoxic crisis or storm: Secondary | ICD-10-CM

## 2011-05-20 MED ORDER — SODIUM IODIDE I 131 CAPSULE
16.2000 | Freq: Once | INTRAVENOUS | Status: AC | PRN
  Administered 2011-05-20: 16.2 via ORAL

## 2011-05-27 ENCOUNTER — Other Ambulatory Visit: Payer: Self-pay | Admitting: *Deleted

## 2011-05-27 ENCOUNTER — Encounter: Payer: Self-pay | Admitting: Infectious Diseases

## 2011-05-27 ENCOUNTER — Ambulatory Visit (INDEPENDENT_AMBULATORY_CARE_PROVIDER_SITE_OTHER): Payer: Self-pay | Admitting: Infectious Diseases

## 2011-05-27 ENCOUNTER — Ambulatory Visit: Payer: Self-pay

## 2011-05-27 DIAGNOSIS — K529 Noninfective gastroenteritis and colitis, unspecified: Secondary | ICD-10-CM

## 2011-05-27 DIAGNOSIS — K5289 Other specified noninfective gastroenteritis and colitis: Secondary | ICD-10-CM

## 2011-05-27 DIAGNOSIS — B20 Human immunodeficiency virus [HIV] disease: Secondary | ICD-10-CM

## 2011-05-27 DIAGNOSIS — R509 Fever, unspecified: Secondary | ICD-10-CM

## 2011-05-27 DIAGNOSIS — C469 Kaposi's sarcoma, unspecified: Secondary | ICD-10-CM

## 2011-05-27 HISTORY — DX: Noninfective gastroenteritis and colitis, unspecified: K52.9

## 2011-05-27 MED ORDER — EMTRICITABINE-TENOFOVIR DF 200-300 MG PO TABS: 1.0000 | ORAL_TABLET | Freq: Every day | ORAL | Status: DC

## 2011-05-27 MED ORDER — AZITHROMYCIN 600 MG PO TABS: 1200.0000 mg | ORAL_TABLET | ORAL | Status: DC

## 2011-05-27 MED ORDER — DIPHENOXYLATE-ATROPINE 2.5-0.025 MG PO TABS: 1.0000 | ORAL_TABLET | Freq: Four times a day (QID) | ORAL | Status: DC | PRN

## 2011-05-27 MED ORDER — RALTEGRAVIR POTASSIUM 400 MG PO TABS: 400.0000 mg | ORAL_TABLET | Freq: Two times a day (BID) | ORAL | Status: DC

## 2011-05-27 MED ORDER — SULFAMETHOXAZOLE-TMP DS 800-160 MG PO TABS: 1.0000 | ORAL_TABLET | ORAL | Status: DC

## 2011-05-27 MED ORDER — ACETAMINOPHEN 325 MG PO TABS
325.0000 mg | ORAL_TABLET | Freq: Once | ORAL | Status: AC
  Administered 2011-05-27: 325 mg via ORAL

## 2011-05-27 MED ORDER — CIPROFLOXACIN HCL 500 MG PO TABS: 500.0000 mg | ORAL_TABLET | Freq: Two times a day (BID) | ORAL | Status: AC

## 2011-05-27 NOTE — Assessment & Plan Note (Signed)
Appears to be stable. Will f/u his counts at next visit.

## 2011-05-27 NOTE — Assessment & Plan Note (Signed)
Has f/u with h/o next month. Has not recently received ctx.

## 2011-05-27 NOTE — Progress Notes (Signed)
  Subjective:    Patient ID: Steven [name], male    DOB: 05-07-1981, 30 y.o.   MRN: [id number]  HPI 30 yo M with AIDS, KS, prev CNS bleed due to thrombocytopenia from CTX, and adm to [facility] with SOB, asthma exacerbation. He was also diagnosed with hyperthyroidism. At last visit, had sx of worsening KS in his legs.  CD4 30 and VL <20 (Sept 2012).  Has been having chills and diarrhea for 2 days. Able to keep food down, decreased apetite. Has been taking mostly fluids. His LE swelling has gone down somewhat. Has appt with h/o in December. No dizzyness or lightheadedness.    Review of Systems     Objective:   Physical Exam  Constitutional: He appears well-developed and well-nourished.  Eyes: EOM are normal. Pupils are equal, round, and reactive to light.  Neck: Neck supple.  Cardiovascular: Normal heart sounds.  Tachycardia present.   Pulmonary/Chest: Effort normal and breath sounds normal. No respiratory distress.  Abdominal: Soft. Normal appearance. He exhibits no distension and no mass. Bowel sounds are increased. There is no tenderness. There is no rebound.  Musculoskeletal: He exhibits edema.       Significant edema at ankles, non-pitting.   Lymphadenopathy:    He has no cervical adenopathy.          Assessment & Plan:

## 2011-05-27 NOTE — Progress Notes (Signed)
Addended by: Starleen Arms D on: 05/27/2011 04:57 PM   Modules accepted: Orders

## 2011-05-27 NOTE — Assessment & Plan Note (Signed)
Suspect this is secondary to a viral infection. Will give him cipro for 3 days in case this is not the case. Will also give him rx for lomotil. Will send stool studies as well. Encouraged him to take tylenol and ibuprofen for fever. Encouraged him to keep drinking fluids. He will call if he is not improved over the weekend.

## 2011-06-24 ENCOUNTER — Ambulatory Visit (HOSPITAL_BASED_OUTPATIENT_CLINIC_OR_DEPARTMENT_OTHER): Payer: Self-pay

## 2011-06-24 DIAGNOSIS — C469 Kaposi's sarcoma, unspecified: Secondary | ICD-10-CM

## 2011-06-24 DIAGNOSIS — Z452 Encounter for adjustment and management of vascular access device: Secondary | ICD-10-CM

## 2011-06-24 MED ORDER — SODIUM CHLORIDE 0.9 % IJ SOLN
10.0000 mL | INTRAMUSCULAR | Status: DC | PRN
  Administered 2011-06-24: 10 mL via INTRAVENOUS
  Filled 2011-06-24: qty 10

## 2011-06-24 MED ORDER — HEPARIN SOD (PORK) LOCK FLUSH 100 UNIT/ML IV SOLN
500.0000 [IU] | Freq: Once | INTRAVENOUS | Status: AC
  Administered 2011-06-24: 500 [IU] via INTRAVENOUS
  Filled 2011-06-24: qty 5

## 2011-06-25 ENCOUNTER — Telehealth: Payer: Self-pay | Admitting: *Deleted

## 2011-06-25 NOTE — Telephone Encounter (Signed)
left patient voice message to inform the patient of the new date and time of the appointment in 08-2011

## 2011-08-09 ENCOUNTER — Other Ambulatory Visit: Payer: Self-pay | Admitting: Infectious Diseases

## 2011-08-09 DIAGNOSIS — B20 Human immunodeficiency virus [HIV] disease: Secondary | ICD-10-CM

## 2011-08-09 DIAGNOSIS — Z113 Encounter for screening for infections with a predominantly sexual mode of transmission: Secondary | ICD-10-CM

## 2011-08-09 DIAGNOSIS — Z79899 Other long term (current) drug therapy: Secondary | ICD-10-CM

## 2011-08-17 ENCOUNTER — Other Ambulatory Visit: Payer: Self-pay

## 2011-08-19 ENCOUNTER — Telehealth: Payer: Self-pay | Admitting: *Deleted

## 2011-08-19 ENCOUNTER — Ambulatory Visit (HOSPITAL_BASED_OUTPATIENT_CLINIC_OR_DEPARTMENT_OTHER): Payer: Self-pay

## 2011-08-19 DIAGNOSIS — C469 Kaposi's sarcoma, unspecified: Secondary | ICD-10-CM

## 2011-08-19 DIAGNOSIS — Z452 Encounter for adjustment and management of vascular access device: Secondary | ICD-10-CM

## 2011-08-19 MED ORDER — HEPARIN SOD (PORK) LOCK FLUSH 100 UNIT/ML IV SOLN
500.0000 [IU] | Freq: Once | INTRAVENOUS | Status: AC
  Administered 2011-08-19: 500 [IU] via INTRAVENOUS
  Filled 2011-08-19: qty 5

## 2011-08-19 MED ORDER — SODIUM CHLORIDE 0.9 % IJ SOLN
10.0000 mL | INTRAMUSCULAR | Status: DC | PRN
Start: 2011-08-19 — End: 2011-08-19
  Administered 2011-08-19: 10 mL via INTRAVENOUS
  Filled 2011-08-19: qty 10

## 2011-08-19 NOTE — Telephone Encounter (Signed)
Called patient and left voice mail to call to reschedule his lab appointment.  He no showed 08/18/11. Wendall Mola CMA

## 2011-08-31 ENCOUNTER — Ambulatory Visit: Payer: Self-pay | Admitting: Infectious Diseases

## 2011-09-02 ENCOUNTER — Ambulatory Visit: Payer: Self-pay | Admitting: Oncology

## 2011-09-02 ENCOUNTER — Other Ambulatory Visit: Payer: Self-pay | Admitting: Lab

## 2011-09-05 ENCOUNTER — Other Ambulatory Visit: Payer: Self-pay | Admitting: *Deleted

## 2011-09-05 ENCOUNTER — Other Ambulatory Visit (INDEPENDENT_AMBULATORY_CARE_PROVIDER_SITE_OTHER): Payer: Self-pay

## 2011-09-05 DIAGNOSIS — Z79899 Other long term (current) drug therapy: Secondary | ICD-10-CM

## 2011-09-05 DIAGNOSIS — Z113 Encounter for screening for infections with a predominantly sexual mode of transmission: Secondary | ICD-10-CM

## 2011-09-05 DIAGNOSIS — B20 Human immunodeficiency virus [HIV] disease: Secondary | ICD-10-CM

## 2011-09-06 LAB — LIPID PANEL
HDL: 38 mg/dL — ABNORMAL LOW (ref >39)
LDL Cholesterol: 71 mg/dL (ref 0–99)
Total CHOL/HDL Ratio: 3.4 Ratio
VLDL: 20 mg/dL (ref 0–40)

## 2011-09-06 LAB — COMPREHENSIVE METABOLIC PANEL
ALT: 17 U/L (ref 0–53)
AST: 22 U/L (ref 0–37)
Alkaline Phosphatase: 88 U/L (ref 39–117)
Calcium: 9.4 mg/dL (ref 8.4–10.5)
Chloride: 103 mEq/L (ref 96–112)
Creat: 1.31 mg/dL (ref 0.50–1.35)
Total Bilirubin: 1.3 mg/dL — ABNORMAL HIGH (ref 0.3–1.2)

## 2011-09-06 LAB — CBC WITH DIFFERENTIAL/PLATELET
Basophils Absolute: 0 10*3/uL (ref 0.0–0.1)
Basophils Relative: 0 % (ref 0–1)
Eosinophils Relative: 14 % — ABNORMAL HIGH (ref 0–5)
Lymphocytes Relative: 37 % (ref 12–46)
MCV: 82.1 fL (ref 78.0–100.0)
Platelets: 183 10*3/uL (ref 150–400)
RDW: 15.3 % (ref 11.5–15.5)
WBC: 4.6 10*3/uL (ref 4.0–10.5)

## 2011-09-06 LAB — T-HELPER CELL (CD4) - (RCID CLINIC ONLY): CD4 T Cell Abs: 160 uL — ABNORMAL LOW (ref 400–2700)

## 2011-09-07 LAB — HIV-1 RNA QUANT-NO REFLEX-BLD: HIV-1 RNA Quant, Log: <1.3 {Log} (ref <1.30)

## 2011-09-19 ENCOUNTER — Ambulatory Visit (INDEPENDENT_AMBULATORY_CARE_PROVIDER_SITE_OTHER): Payer: Self-pay | Admitting: Infectious Diseases

## 2011-09-19 ENCOUNTER — Encounter: Payer: Self-pay | Admitting: Infectious Diseases

## 2011-09-19 VITALS — BP 164/129 | HR 75 | Temp 98.4°F | Ht 73.0 in | Wt 239.0 lb

## 2011-09-19 DIAGNOSIS — B20 Human immunodeficiency virus [HIV] disease: Secondary | ICD-10-CM

## 2011-09-19 DIAGNOSIS — E0590 Thyrotoxicosis, unspecified without thyrotoxic crisis or storm: Secondary | ICD-10-CM

## 2011-09-19 DIAGNOSIS — I10 Essential (primary) hypertension: Secondary | ICD-10-CM

## 2011-09-19 DIAGNOSIS — C469 Kaposi's sarcoma, unspecified: Secondary | ICD-10-CM

## 2011-09-19 DIAGNOSIS — E0500 Thyrotoxicosis with diffuse goiter without thyrotoxic crisis or storm: Secondary | ICD-10-CM

## 2011-09-19 MED ORDER — LEVOTHYROXINE SODIUM 175 MCG PO TABS: 175.0000 ug | ORAL_TABLET | Freq: Every day | ORAL | Status: DC

## 2011-09-19 NOTE — Assessment & Plan Note (Signed)
Has oncology f/u, appreciate their partnering with [place].

## 2011-09-19 NOTE — Progress Notes (Signed)
  Subjective:    Patient ID: Steven [name], male    DOB: February 20, 1981, 31 y.o.   MRN: [id number]  HPI 31 yo M with AIDS, KS, prev CNS bleed due to thrombocytopenia from CTX, asthma and graves disease.  His labs 09-05-11 showed an increase in his Cr 1.31. Is going to see PCP Teacher, English as a foreign language at [facility]).  Now on synthroid, throat less swollen. Has endo f/u next month.  States he has been feeling better- had some sinus problems but swelling in his legs has improved. Has oncology f/u next month. No further CTX.  No problems with ART.   HIV 1 RNA Quant (copies/mL)  Date Value  09/05/2011 <20   03/29/2011 <20   04/07/2010 <20 copies/mL      CD4 T Cell Abs (cmm)  Date Value  09/05/2011 160*  03/26/2011 30*  04/07/2010 80*        Review of Systems  HENT: Positive for postnasal drip. Negative for neck pain.   Respiratory: Negative for cough and shortness of breath.   Gastrointestinal: Negative for diarrhea and constipation.  Genitourinary: Negative for dysuria and difficulty urinating.       Objective:   Physical Exam  Constitutional: He appears well-developed and well-nourished.  HENT:  Mouth/Throat: No oropharyngeal exudate.  Eyes: EOM are normal. Pupils are equal, round, and reactive to light.  Neck: Neck supple. No thyromegaly present.  Cardiovascular: Normal rate, regular rhythm and normal heart sounds.   Pulmonary/Chest: Effort normal and breath sounds normal. No respiratory distress. He has no wheezes.  Abdominal: Soft. Bowel sounds are normal. He exhibits no distension. There is no tenderness.  Lymphadenopathy:    He has no cervical adenopathy.          Assessment & Plan:

## 2011-09-19 NOTE — Assessment & Plan Note (Signed)
Has f/u with endo, driving HTN?

## 2011-09-19 NOTE — Assessment & Plan Note (Signed)
He has f/u with his PCP for his BP, he will most likely need repeat Cr then as well.

## 2011-09-19 NOTE — Assessment & Plan Note (Signed)
He is doing very well. Hopefully CD4 will increase more and we can stop bactrim. He is offered/refused condoms. His vaccines are up to date but i don't see info on hepatitis serologies or vaccines. Will repeat these at his next lab. He will rtc in 6 months.

## 2011-10-11 ENCOUNTER — Telehealth: Payer: Self-pay

## 2011-10-11 NOTE — Telephone Encounter (Signed)
Called patient about renewing adap/rw - left message .

## 2011-10-21 ENCOUNTER — Ambulatory Visit: Payer: Self-pay

## 2011-11-04 ENCOUNTER — Ambulatory Visit (HOSPITAL_BASED_OUTPATIENT_CLINIC_OR_DEPARTMENT_OTHER): Payer: Self-pay | Admitting: Oncology

## 2011-11-04 ENCOUNTER — Other Ambulatory Visit (HOSPITAL_BASED_OUTPATIENT_CLINIC_OR_DEPARTMENT_OTHER): Payer: Self-pay | Admitting: Lab

## 2011-11-04 ENCOUNTER — Encounter: Payer: Self-pay | Admitting: Oncology

## 2011-11-04 ENCOUNTER — Telehealth: Payer: Self-pay | Admitting: Oncology

## 2011-11-04 DIAGNOSIS — C7952 Secondary malignant neoplasm of bone marrow: Secondary | ICD-10-CM

## 2011-11-04 DIAGNOSIS — B20 Human immunodeficiency virus [HIV] disease: Secondary | ICD-10-CM

## 2011-11-04 DIAGNOSIS — C469 Kaposi's sarcoma, unspecified: Secondary | ICD-10-CM

## 2011-11-04 DIAGNOSIS — D696 Thrombocytopenia, unspecified: Secondary | ICD-10-CM

## 2011-11-04 DIAGNOSIS — C7951 Secondary malignant neoplasm of bone: Secondary | ICD-10-CM

## 2011-11-04 LAB — CBC WITH DIFFERENTIAL/PLATELET
Eosinophils Absolute: 0.2 10*3/uL (ref 0.0–0.5)
HCT: 48.6 % (ref 38.4–49.9)
HGB: 16.3 g/dL (ref 13.0–17.1)
LYMPH%: 53.5 % — ABNORMAL HIGH (ref 14.0–49.0)
MONO#: 0.3 10*3/uL (ref 0.1–0.9)
NEUT#: 0.8 10*3/uL — ABNORMAL LOW (ref 1.5–6.5)
NEUT%: 28.9 % — ABNORMAL LOW (ref 39.0–75.0)
Platelets: 92 10*3/uL — ABNORMAL LOW (ref 140–400)
WBC: 2.7 10*3/uL — ABNORMAL LOW (ref 4.0–10.3)
lymph#: 1.5 10*3/uL (ref 0.9–3.3)

## 2011-11-04 LAB — COMPREHENSIVE METABOLIC PANEL
ALT: 42 U/L (ref 0–53)
AST: 41 U/L — ABNORMAL HIGH (ref 0–37)
Alkaline Phosphatase: 73 U/L (ref 39–117)
BUN: 14 mg/dL (ref 6–23)
Calcium: 9.2 mg/dL (ref 8.4–10.5)
Creatinine, Ser: 1.19 mg/dL (ref 0.50–1.35)
Total Bilirubin: 0.8 mg/dL (ref 0.3–1.2)

## 2011-11-04 NOTE — Progress Notes (Signed)
OFFICE PROGRESS NOTE  CC Dr. 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  #2 bilateral subdural hematoma in August 2011 status post evacuation.  #3 HIV disease on highly active antiretroviral therapy or  PRIOR THERAPY: #1 metastatic Kaposi's sarcoma patient previously has been treated with chemotherapy including Taxol. He also received liposomal doxorubicin.  #2 unfortunately patient developed subdural hematoma and his chemotherapy was discontinued and he has been on observation only.  #3 patient has been on HART therapy for his HIV disease  CURRENT THERAPY:Observation for Kaposi sarcoma  INTERVAL HISTORY: Collie Wernick 31 y.o. male returns for Followup visit. He was last seen by me back in August 2012. Clinically patient seems to be doing well although he is suffering from environmental allergies and he does have wheezing on my chest examination. He denies any nausea vomiting fevers chills night sweats. He has not lost weight. He is not having any swelling in his lower extremities. On his review of his CBC I am noticing that his white count is low at 2.7 with a neutrophil count of only 0.8. His platelets also have decreased to 92,000. This is of unclear etiology. Patient tells me that he is continuing to take his antiretroviral therapy. He also, his that his last CD4 count was up. And he is HIV is under good control. He has noticed a few nodules on his skin especially the bilateral lower extremities could this be Kaposi's. But he has no other evidence any cutaneous nodules. Remainder of the 10 point review of systems is negative.  MEDICAL HISTORY: Past Medical History  Diagnosis Date  . HIV positive 03/23/09    Genotype [id number]  . Kaposi's sarcoma   . HTN (hypertension)   . Syphilis 03/23/09-    1:2  . Cancer     ALLERGIES:  is allergic to penicillins and aspirin.  MEDICATIONS:   Current Outpatient Prescriptions  Medication Sig Dispense Refill  . acetaminophen (TYLENOL) 500 MG tablet Take 500 mg by mouth every 6 (six) hours as needed.        Marland Kitchen albuterol (PROVENTIL HFA;VENTOLIN HFA) 108 (90 BASE) MCG/ACT inhaler Inhale 2 puffs into the lungs every 6 (six) hours as needed.        Marland Kitchen azithromycin (ZITHROMAX) 600 MG tablet Take 2 tablets (1,200 mg total) by mouth every 7 (seven) days. Take 2 tabs by mouth every 7 days  10 tablet  11  . carvedilol (COREG) 12.5 MG tablet Take 1 tablet (12.5 mg total) by mouth 2 (two) times daily with a meal.  30 tablet  6  . emtricitabine-tenofovir (TRUVADA) 200-300 MG per tablet Take 1 tablet by mouth daily.  30 tablet  11  . levothyroxine (SYNTHROID) 175 MCG tablet Take 1 tablet (175 mcg total) by mouth daily.  90 tablet  3  . raltegravir (ISENTRESS) 400 MG tablet Take 1 tablet (400 mg total) by mouth 2 (two) times daily.  60 tablet  11  . sulfamethoxazole-trimethoprim (BACTRIM DS) 800-160 MG per tablet Take 1 tablet by mouth 3 (three) times a week. one tab by mouth every Monday, Wednesday & friday  30 tablet  6  . Fluticasone-Salmeterol (ADVAIR) 250-50 MCG/DOSE AEPB Inhale 1 puff into the lungs every 12 (twelve) hours.          SURGICAL HISTORY: History reviewed. No pertinent past surgical history.  REVIEW OF SYSTEMS:  Pertinent items are noted in HPI.   PHYSICAL EXAMINATION:  HEENT exam: EOMI PERRLA sclerae anicteric no conjunctival pallor oral mucosa is moist neck is supple there is no palpable cervical supraclavicular or axillary adenopathy no inguinal adenopathy. Lungs: Scattered wheezes bilaterally no rales or rhonchi. Heart he vascular: Regular rate rhythm no murmurs gallops or rubs abdomen: Soft nontender nondistended bowel sounds are present no hepatosplenomegaly. Extremities: Warm with 1+ edema no clubbing. Skin: There is noted to be cutaneous nodules on the right lower extremity measuring about 2 mm to 3 mm otherwise no other  findings. Neuro: Patient's alert oriented x3 DTRs are +4 motor and sensory is intact strength is symmetrical.  ECOG PERFORMANCE STATUS: 0 - Asymptomatic  Blood pressure 150/99, pulse 76, temperature 98.8 F (37.1 C), temperature source Oral, height 6\' 1"  (1.854 m), weight 234 lb 1.6 oz (106.187 kg).  LABORATORY DATA: Lab Results  Component Value Date   WBC 2.7* 11/04/2011   HGB 16.3 11/04/2011   HCT 48.6 11/04/2011   MCV 80.5 11/04/2011   PLT 92* 11/04/2011      Chemistry      Component Value Date/Time   NA 140 09/05/2011 1517   NA 133 05/04/2010 1254   K 4.5 09/05/2011 1517   K 4.1 05/04/2010 1254   CL 103 09/05/2011 1517   CL 100 05/04/2010 1254   CO2 29 09/05/2011 1517   CO2 29 05/04/2010 1254   BUN 13 09/05/2011 1517   BUN 12 05/04/2010 1254   CREATININE 1.31 09/05/2011 1517   CREATININE 0.85 03/26/2011 2250      Component Value Date/Time   CALCIUM 9.4 09/05/2011 1517   CALCIUM 8.9 05/04/2010 1254   ALKPHOS 88 09/05/2011 1517   ALKPHOS 110* 05/04/2010 1254   AST 22 09/05/2011 1517   AST 23 05/04/2010 1254   ALT 17 09/05/2011 1517   BILITOT 1.3* 09/05/2011 1517   BILITOT 1.10 05/04/2010 1254       RADIOGRAPHIC STUDIES:  No results found.  ASSESSMENT: 31 year old male with  #1 HIV disease on antiretrovirals therapy.  #2 Kaposi's sarcoma he has been heavily treated in the past. Right now I do think that his Kaposi's is silent he has no lower extremity edema which is a presentation. However he does have these new cutaneous nodules and his white count is down somewhat question really arises whether he has progression of disease.  #3 environmental allergies.  #4 thrombocytopenia and leukopenia with possible involvement of Kaposi or other bone marrow infiltrating disease in this immunocompromised individual.   PLAN:   #1 I will do staging studies including CT of the chest abdomen and pelvis to stage Kaposi's sarcoma.  #2 patient and I discussed bone marrow biopsy and  aspirate to evaluate his bone marrow function and to rule out any bone marrow infiltrating disease. However I will wait to do this until I have the results of his scans.  #3 we discussed neutropenic precautions he knows to monitor his temperatures and to call with any problems.  #4 patient will be seen back in one month's time after he has had his staging CTs. At that time I will again do a CBC.   All questions were answered. The patient knows to call the clinic with any problems, questions or concerns. We can certainly see the patient much sooner if necessary.  I spent 30 minutes counseling the patient face to face. The total time spent in the appointment was 30 minutes.    Drue Second, MD Medical/Oncology [facility] [phone] (beeper) [phone] (Office)  11/04/2011, 9:43 AM

## 2011-11-04 NOTE — Telephone Encounter (Signed)
gve the pt his may 2013 appt calendar along with the ct scan appt °

## 2011-11-04 NOTE — Patient Instructions (Signed)
1. I am concerned about your blood counts today. Your white cells are low and you are at risk for infections. Please exercise precaution about being around people who are sick, good hand washing is very improtant  2. I have ordered CT scans to evaluate the status of your kaposi's sarcoma.  3. We may have to look at you bone marrow function by doing a bone marrow biopsy and aspirate if your counts are not improved by your next visit. The procedure is as below  Bone Marrow Aspiration This is an examination in which a needle is inserted into a bone and a portion of the marrow in the bone is removed. This test confirms the diagnosis of anemias, leukemia, or myelomas. It helps determine the cause of decreased blood cells and deficient iron stores. It documents the process of metastasis if there has been a tumor which has spread to bone. It also helps study tumor staging, especially in lymphomas. There are two methods for sampling bone marrow, an aspirate and a biopsy. The bone marrow, a soft fatty tissue found inside the body's larger bones, is often described as being like a honeycomb - it is a fibrous network filled with a liquid containing cells in various stages of maturation, and "raw materials" such as iron, vitamin B12, and folate that are required for cell production. The bone marrow aspirate provides a liquid sample of cells that can be studied individually, and the biopsy collects a sample that preserves the marrow's structure and shows the relationships of bone marrow cells to one another and the relative amount of marrow cells compared to fat (cellularity).  Red blood cells (RBCs), platelets, and five different types of white blood cells (WBCs) are produced in the marrow as needed, with the number and type of cell being created at any one time based on the use of cells, loss, and a continual replacement of old cells. The bone marrow biopsy and aspiration provide information about the status of and  capability for blood cell production. They are not routinely ordered and in fact the majority of people will never have one done. A marrow aspiration or biopsy may be ordered to help evaluate blood cell production, to help diagnose leukemia, to help diagnose a bone marrow disorder, to help diagnose and stage a variety of other types of cancer (to determine spread into the marrow), and to help determine whether a severe anemia is due to decreased RBC production, increased loss, abnormal RBC production, or to a vitamin or mineral deficiency or excess. Conditions that affect the marrow can affect the number, mixture, and maturity of the cells, and can affect its fibrous structure. PREPARATION FOR TEST  No special preparation is needed. The bone marrow aspiration or biopsy procedure is performed by a caregiver. Both types of samples may be collected from the hip bone (pelvis), and marrow aspirations may be collected from the breastbone (sternum). In children, samples may also be collected from a vertebrae in the back or from the thigh bone (femur).   The most common collection site is the top ridge of the hip bone (iliac crest). Before the procedure, your blood pressure, heart rate, and temperature are measured and evaluated to make sure that they are within normal limits. You may be given a mild sedative. You will be asked to lie down on your stomach or side. Your lower body is draped with cloths so that only the area surrounding the site is exposed.   The site is cleaned  with an antiseptic and injected with a local anesthetic. When the site is numb, the caregiver inserts a needle through the skin and into the bone. For an aspiration, the caregiver attaches a syringe to the needle and pulls back on the plunger. This creates vacuum pressure and pulls a small amount of marrow into the syringe.   For a bone marrow biopsy, the caregiver uses a special needle that allows the collection of a sample of bone and  marrow. Even though your skin has been numbed, you may feel brief but uncomfortable pressure sensations during these procedures. After the needle has been withdrawn, a sterile bandage is placed over the site and pressure is applied. You will then usually be instructed to lie quietly until your blood pressure, heart rate, and temperature are normal. You may be instructed to keep the site dry and covered for 48 hours.   Complications from the bone marrow aspiration or biopsy procedure are rare, but some individuals may have excessive bleeding at the site or develop an infection.   Tell your caregiver about any allergies you have and about any medications or supplements you are taking prior to the procedure.  NORMAL FINDINGS Cell type / Range (%)  Myeloblasts / less than 5   Promyelocytes / 1-8   Myelocytes   Neutrophilic / 5-15   Eosinophilic / 0.5-3   Basophilic / less than 1   Metamyelocytes   Neutrophilic / 15-25   Eosinophilic / less than 1   Basophilic / less than 1   Mature myelocytes   Neutrophilic / 10-30   Eosinophilic / less than 5   Basophilic / less than 5   Mononuclear   Monocytes / less than 5   Lymphocytes / 3-20   Plasma cells / less than 1   Megakaryocytes / less than 5   M/E ratio / less than 4   Normoblasts / 25-50  Normal iron content is demonstrated by staining with Prussian blue. Ranges for normal findings may vary among different laboratories and hospitals. You should always check with your caregiver after having lab work or other tests done to discuss the meaning of your test results and whether your values are considered within normal limits. MEANING OF TEST  Your caregiver will go over the test results with you and discuss the importance and meaning of your results, as well as treatment options and the need for additional tests if necessary. OBTAINING THE TEST RESULTS It is your responsibility to obtain your test results. Ask the lab or  department performing the test when and how you will get your results. Document Released: 07/20/2004 Document Revised: 06/16/2011 Document Reviewed: 06/03/2008 [facility] Patient Information 2012 [place], [place].  4. I will see you back in 1 month

## 2011-11-29 ENCOUNTER — Ambulatory Visit (HOSPITAL_COMMUNITY)
Admission: RE | Admit: 2011-11-29 | Discharge: 2011-11-29 | Disposition: A | Discharge status: Home / Self Care | Payer: Self-pay | Source: Ambulatory Visit | Attending: Oncology | Admitting: Oncology

## 2011-11-29 ENCOUNTER — Encounter (HOSPITAL_COMMUNITY): Payer: Self-pay

## 2011-11-29 ENCOUNTER — Other Ambulatory Visit (HOSPITAL_BASED_OUTPATIENT_CLINIC_OR_DEPARTMENT_OTHER): Payer: Self-pay | Admitting: Lab

## 2011-11-29 DIAGNOSIS — C469 Kaposi's sarcoma, unspecified: Secondary | ICD-10-CM

## 2011-11-29 DIAGNOSIS — I7789 Other specified disorders of arteries and arterioles: Secondary | ICD-10-CM | POA: Insufficient documentation

## 2011-11-29 DIAGNOSIS — Z923 Personal history of irradiation: Secondary | ICD-10-CM | POA: Insufficient documentation

## 2011-11-29 DIAGNOSIS — N281 Cyst of kidney, acquired: Secondary | ICD-10-CM | POA: Insufficient documentation

## 2011-11-29 DIAGNOSIS — B20 Human immunodeficiency virus [HIV] disease: Secondary | ICD-10-CM | POA: Insufficient documentation

## 2011-11-29 DIAGNOSIS — C7951 Secondary malignant neoplasm of bone: Secondary | ICD-10-CM | POA: Insufficient documentation

## 2011-11-29 DIAGNOSIS — Z9221 Personal history of antineoplastic chemotherapy: Secondary | ICD-10-CM | POA: Insufficient documentation

## 2011-11-29 LAB — CBC & DIFF AND RETIC
Basophils Absolute: 0 10*3/uL (ref 0.0–0.1)
Eosinophils Absolute: 0.3 10*3/uL (ref 0.0–0.5)
HCT: 48.5 % (ref 38.4–49.9)
HGB: 16.4 g/dL (ref 13.0–17.1)
MCV: 78.4 fL — ABNORMAL LOW (ref 79.3–98.0)
MONO%: 7.1 % (ref 0.0–14.0)
NEUT#: 2.1 10*3/uL (ref 1.5–6.5)
NEUT%: 48.6 % (ref 39.0–75.0)
Platelets: 150 10*3/uL (ref 140–400)
RDW: 13.5 % (ref 11.0–14.6)
Retic %: 2.7 % — ABNORMAL HIGH (ref 0.80–1.80)

## 2011-11-29 MED ORDER — IOHEXOL 300 MG/ML  SOLN
100.0000 mL | Freq: Once | INTRAMUSCULAR | Status: AC | PRN
  Administered 2011-11-29: 100 mL via INTRAVENOUS

## 2011-12-02 ENCOUNTER — Encounter: Payer: Self-pay | Admitting: Oncology

## 2011-12-02 ENCOUNTER — Telehealth: Payer: Self-pay | Admitting: *Deleted

## 2011-12-02 ENCOUNTER — Ambulatory Visit (HOSPITAL_BASED_OUTPATIENT_CLINIC_OR_DEPARTMENT_OTHER): Payer: Self-pay | Admitting: Oncology

## 2011-12-02 VITALS — BP 164/116 | HR 67 | Temp 98.3°F | Ht 73.0 in | Wt 239.1 lb

## 2011-12-02 DIAGNOSIS — B20 Human immunodeficiency virus [HIV] disease: Secondary | ICD-10-CM

## 2011-12-02 DIAGNOSIS — C469 Kaposi's sarcoma, unspecified: Secondary | ICD-10-CM

## 2011-12-02 NOTE — Patient Instructions (Signed)
1. Your blood work looks good today, CT scans are stable  2. We will see you back in 4 months

## 2011-12-02 NOTE — Progress Notes (Signed)
OFFICE PROGRESS NOTE  CC Dr. 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  #2 bilateral subdural hematoma in August 2011 status post evacuation.  #3 HIV disease on highly active antiretroviral therapy or  PRIOR THERAPY: #1 metastatic Kaposi's sarcoma patient previously has been treated with chemotherapy including Taxol. He also received liposomal doxorubicin.  #2 unfortunately patient developed subdural hematoma and his chemotherapy was discontinued and he has been on observation only.  #3 patient has been on HART therapy for his HIV disease  CURRENT THERAPY:Observation for Kaposi sarcoma  INTERVAL HISTORY: Momodou Consiglio 31 y.o. male returns for Followup visit.for all he feels better. He did have some symptoms what sounds like an upper respiratory tract infection on his previous visit. This is much improved. He otherwise denies any fevers chills night sweats headaches shortness of breath chest pains palpitations he does still have some lower extremity swelling but it is improved. He has no back pain no myalgias or arthralgias. Remainder of the 10 point review of systems is negative.  MEDICAL HISTORY: Past Medical History  Diagnosis Date  . HIV positive 03/23/09    Genotype Y181C  . HTN (hypertension)   . Syphilis 03/23/09-    1:2  . Kaposi's sarcoma   . Cancer     ALLERGIES:  is allergic to penicillins and aspirin.  MEDICATIONS:  Current Outpatient Prescriptions  Medication Sig Dispense Refill  . acetaminophen (TYLENOL) 500 MG tablet Take 500 mg by mouth every 6 (six) hours as needed.        Marland Kitchen azithromycin (ZITHROMAX) 600 MG tablet Take 2 tablets (1,200 mg total) by mouth every 7 (seven) days. Take 2 tabs by mouth every 7 days  10 tablet  11  . carvedilol (COREG) 12.5 MG tablet Take 1 tablet (12.5 mg total) by mouth 2 (two) times daily with a meal.  30 tablet  6  .  emtricitabine-tenofovir (TRUVADA) 200-300 MG per tablet Take 1 tablet by mouth daily.  30 tablet  11  . Fluticasone-Salmeterol (ADVAIR) 250-50 MCG/DOSE AEPB Inhale 1 puff into the lungs every 12 (twelve) hours.        Marland Kitchen levothyroxine (SYNTHROID) 175 MCG tablet Take 1 tablet (175 mcg total) by mouth daily.  90 tablet  3  . raltegravir (ISENTRESS) 400 MG tablet Take 1 tablet (400 mg total) by mouth 2 (two) times daily.  60 tablet  11  . sulfamethoxazole-trimethoprim (BACTRIM DS) 800-160 MG per tablet Take 1 tablet by mouth 3 (three) times a week. one tab by mouth every Monday, Wednesday & friday  30 tablet  6  . albuterol (PROVENTIL HFA;VENTOLIN HFA) 108 (90 BASE) MCG/ACT inhaler Inhale 2 puffs into the lungs every 6 (six) hours as needed.          SURGICAL HISTORY: No past surgical history on file.  REVIEW OF SYSTEMS:  Pertinent items are noted in HPI.   PHYSICAL EXAMINATION:  HEENT exam: EOMI PERRLA sclerae anicteric no conjunctival pallor oral mucosa is moist neck is supple there is no palpable cervical supraclavicular or axillary adenopathy no inguinal adenopathy. Lungs: Scattered wheezes bilaterally no rales or rhonchi. Heart he vascular: Regular rate rhythm no murmurs gallops or rubs abdomen: Soft nontender nondistended bowel sounds are present no hepatosplenomegaly. Extremities: Warm with 1+ edema no clubbing. Skin: There is noted to be cutaneous nodules on the right lower extremity measuring about 2 mm to 3 mm otherwise no other findings. Neuro: Patient's  alert oriented x3 DTRs are +4 motor and sensory is intact strength is symmetrical.  ECOG PERFORMANCE STATUS: 0 - Asymptomatic  Blood pressure 164/116, pulse 67, temperature 98.3 F (36.8 C), height 6\' 1"  (1.854 m), weight 239 lb 1.6 oz (108.455 kg).  LABORATORY DATA: Lab Results  Component Value Date   WBC 4.3 11/29/2011   HGB 16.4 11/29/2011   HCT 48.5 11/29/2011   MCV 78.4* 11/29/2011   PLT 150 11/29/2011      Chemistry        Component Value Date/Time   NA 142 11/04/2011 0919   NA 133 05/04/2010 1254   K 4.3 11/04/2011 0919   K 4.1 05/04/2010 1254   CL 105 11/04/2011 0919   CL 100 05/04/2010 1254   CO2 31 11/04/2011 0919   CO2 29 05/04/2010 1254   BUN 14 11/04/2011 0919   BUN 12 05/04/2010 1254   CREATININE 1.19 11/04/2011 0919   CREATININE 1.31 09/05/2011 1517      Component Value Date/Time   CALCIUM 9.2 11/04/2011 0919   CALCIUM 8.9 05/04/2010 1254   ALKPHOS 73 11/04/2011 0919   ALKPHOS 110* 05/04/2010 1254   AST 41* 11/04/2011 0919   AST 23 05/04/2010 1254   ALT 42 11/04/2011 0919   BILITOT 0.8 11/04/2011 0919   BILITOT 1.10 05/04/2010 1254       RADIOGRAPHIC STUDIES:  No results found.  ASSESSMENT: 31 year old male with  #1 HIV disease on antiretrovirals therapy.  #2 Kaposi's sarcoma he has been heavily treated in the past. Right now I do think that his Kaposi's is silent he has no lower extremity edema which is a presentation. However he does have these new cutaneous nodules and his white count is down somewhat question really arises whether he has progression of disease.  #3 environmental allergies.  #4 thrombocytopenia and leukopenia with possible involvement of Kaposi or other bone marrow infiltrating disease in this immunocompromised individual.   PLAN:  1Patient had CT scans performed as part of his restaging workup for Kaposi's sarcoma. The CTs reveal no evidence of progressive disease.  #2 patient's CBC looks much better with normalization of his platelets and white cell series.    #3 I will plan on seeing the patient back in 4-6 months time in followup.   All questions were answered. The patient knows to call the clinic with any problems, questions or concerns. We can certainly see the patient much sooner if necessary.  I spent 30 minutes counseling the patient face to face. The total time spent in the appointment was 30 minutes.    Drue Second, MD Medical/Oncology [facility] [phone] (beeper) [phone] (Office)  12/02/2011, 3:34 PM

## 2011-12-02 NOTE — Telephone Encounter (Signed)
gave patient appointment for 03-22-2012 starting at 2:00pm printed out calendar and gave to the patient

## 2011-12-16 ENCOUNTER — Other Ambulatory Visit: Payer: Self-pay | Admitting: *Deleted

## 2011-12-16 DIAGNOSIS — I10 Essential (primary) hypertension: Secondary | ICD-10-CM

## 2011-12-16 MED ORDER — CARVEDILOL 12.5 MG PO TABS: 12.5000 mg | ORAL_TABLET | Freq: Two times a day (BID) | ORAL | Status: DC

## 2012-02-07 ENCOUNTER — Encounter (HOSPITAL_COMMUNITY): Payer: Self-pay | Admitting: *Deleted

## 2012-02-07 DIAGNOSIS — J45909 Unspecified asthma, uncomplicated: Secondary | ICD-10-CM | POA: Insufficient documentation

## 2012-02-07 DIAGNOSIS — Z21 Asymptomatic human immunodeficiency virus [HIV] infection status: Secondary | ICD-10-CM | POA: Insufficient documentation

## 2012-02-07 DIAGNOSIS — I10 Essential (primary) hypertension: Secondary | ICD-10-CM | POA: Insufficient documentation

## 2012-02-07 MED ORDER — ALBUTEROL SULFATE (5 MG/ML) 0.5% IN NEBU
2.5000 mg | INHALATION_SOLUTION | Freq: Once | RESPIRATORY_TRACT | Status: DC
  Filled 2012-02-07: qty 0.5

## 2012-02-07 MED ORDER — ALBUTEROL SULFATE (5 MG/ML) 0.5% IN NEBU
INHALATION_SOLUTION | RESPIRATORY_TRACT | Status: AC
  Filled 2012-02-07: qty 0.5

## 2012-02-07 MED ORDER — IPRATROPIUM BROMIDE 0.02 % IN SOLN
RESPIRATORY_TRACT | Status: AC
  Filled 2012-02-07: qty 2.5

## 2012-02-07 MED ORDER — IPRATROPIUM BROMIDE 0.02 % IN SOLN: 0.5000 mg | Freq: Once | RESPIRATORY_TRACT | Status: DC

## 2012-02-07 NOTE — ED Notes (Signed)
PT reports SHOB and chest pressure. Medical HX of asthma . SHOB first started  2days ago but worse today.

## 2012-02-08 ENCOUNTER — Emergency Department (HOSPITAL_COMMUNITY): Payer: Self-pay

## 2012-02-08 ENCOUNTER — Emergency Department (HOSPITAL_COMMUNITY)
Admission: EM | Admit: 2012-02-08 | Discharge: 2012-02-08 | Disposition: A | Discharge status: Home / Self Care | Payer: Medicaid Other | Attending: Emergency Medicine | Admitting: Emergency Medicine

## 2012-02-08 DIAGNOSIS — J9801 Acute bronchospasm: Secondary | ICD-10-CM

## 2012-02-08 DIAGNOSIS — I10 Essential (primary) hypertension: Secondary | ICD-10-CM

## 2012-02-08 HISTORY — DX: Unspecified asthma, uncomplicated: J45.909

## 2012-02-08 LAB — CBC WITH DIFFERENTIAL/PLATELET
Basophils Absolute: 0 10*3/uL (ref 0.0–0.1)
Eosinophils Absolute: 0.4 10*3/uL (ref 0.0–0.7)
Eosinophils Relative: 8 % — ABNORMAL HIGH (ref 0–5)
HCT: 48.3 % (ref 39.0–52.0)
MCH: 27.2 pg (ref 26.0–34.0)
MCV: 82.6 fL (ref 78.0–100.0)
Monocytes Absolute: 0.3 10*3/uL (ref 0.1–1.0)
Platelets: 153 10*3/uL (ref 150–400)
RDW: 15.3 % (ref 11.5–15.5)

## 2012-02-08 LAB — BASIC METABOLIC PANEL
CO2: 30 mEq/L (ref 19–32)
Calcium: 9 mg/dL (ref 8.4–10.5)
Creatinine, Ser: 1.31 mg/dL (ref 0.50–1.35)
GFR calc non Af Amer: 71 mL/min — ABNORMAL LOW (ref >90)
Glucose, Bld: 108 mg/dL — ABNORMAL HIGH (ref 70–99)
Sodium: 139 mEq/L (ref 135–145)

## 2012-02-08 MED ORDER — PREDNISONE 20 MG PO TABS
60.0000 mg | ORAL_TABLET | Freq: Once | ORAL | Status: AC
  Administered 2012-02-08: 60 mg via ORAL
  Filled 2012-02-08: qty 3

## 2012-02-08 MED ORDER — ALBUTEROL SULFATE HFA 108 (90 BASE) MCG/ACT IN AERS: 2.0000 | INHALATION_SPRAY | RESPIRATORY_TRACT | Status: DC | PRN

## 2012-02-08 MED ORDER — PREDNISONE 20 MG PO TABS: 60.0000 mg | ORAL_TABLET | Freq: Every day | ORAL | Status: AC

## 2012-02-08 MED ORDER — IPRATROPIUM BROMIDE HFA 17 MCG/ACT IN AERS
2.0000 | INHALATION_SPRAY | Freq: Once | RESPIRATORY_TRACT | Status: AC
  Administered 2012-02-08: 2 via RESPIRATORY_TRACT
  Filled 2012-02-08: qty 12.9

## 2012-02-08 MED ORDER — ALBUTEROL SULFATE (5 MG/ML) 0.5% IN NEBU
2.5000 mg | INHALATION_SOLUTION | Freq: Once | RESPIRATORY_TRACT | Status: AC
  Administered 2012-02-08: 2.5 mg via RESPIRATORY_TRACT

## 2012-02-13 NOTE — ED Provider Notes (Addendum)
History     CSN: [id number]  Arrival date & time 02/07/12  2240   First MD Initiated Contact with Patient 02/08/12 0226      Chief Complaint  Patient presents with  . Shortness of Breath    (Consider location/radiation/quality/duration/timing/severity/associated sxs/prior treatment) HPI 31 yo male presents to the ER with complaint of sob and wheezing.  Pt with pmh significant for AIDS, low cd4 count on chronic zithro/bactrim therapy, asthma,kaposi's sarcoma.  Pt reports he has been compliant with his HIV therapy, and his counts have been improving.  He has had a cold recently, which has triggered an asthma attack.  Pt denies fever,  Cough, sputum production.  He has not felt unwell.  Pt has run out of his inhaler.  Last attack about a year ago.   Past Medical History  Diagnosis Date  . HIV positive 03/23/09    Genotype Y181C  . HTN (hypertension)   . Syphilis 03/23/09-    1:2  . Kaposi's sarcoma   . Cancer   . Asthma     History reviewed. No pertinent past surgical history.  Family History  Problem Relation Age of Onset  . Pancreatic cancer Mother     History  Substance Use Topics  . Smoking status: Never Smoker   . Smokeless tobacco: Never Used  . Alcohol Use: No      Review of Systems  All other systems reviewed and are negative.    Allergies  Penicillins and Aspirin  Home Medications   Current Outpatient Rx  Name Route Sig Dispense Refill  . ACETAMINOPHEN 500 MG PO TABS Oral Take 500 mg by mouth every 6 (six) hours as needed. Pain or fever    . ALBUTEROL SULFATE HFA 108 (90 BASE) MCG/ACT IN AERS Inhalation Inhale 2 puffs into the lungs every 6 (six) hours as needed.      . AZITHROMYCIN 600 MG PO TABS Oral Take 1,200 mg by mouth every 7 (seven) days. Every Saturday    . CARVEDILOL 12.5 MG PO TABS Oral Take 1 tablet (12.5 mg total) by mouth 2 (two) times daily with a meal. 30 tablet 6  . EMTRICITABINE-TENOFOVIR 200-300 MG PO TABS Oral Take 1 tablet by mouth  daily. 30 tablet 11  . LEVOTHYROXINE SODIUM 175 MCG PO TABS Oral Take 1 tablet (175 mcg total) by mouth daily. 90 tablet 3  . RALTEGRAVIR POTASSIUM 400 MG PO TABS Oral Take 1 tablet (400 mg total) by mouth 2 (two) times daily. 60 tablet 11  . SULFAMETHOXAZOLE-TMP DS 800-160 MG PO TABS Oral Take 1 tablet by mouth 3 (three) times a week. one tab by mouth every Monday, Wednesday & friday 30 tablet 6    Ignore previous refill that had it daily  . ALBUTEROL SULFATE HFA 108 (90 BASE) MCG/ACT IN AERS Inhalation Inhale 2 puffs into the lungs every 4 (four) hours as needed for wheezing or shortness of breath. 1 Inhaler 0  . PREDNISONE 20 MG PO TABS Oral Take 3 tablets (60 mg total) by mouth daily. 15 tablet 0    BP 154/103  Pulse 58  Temp 98.1 F (36.7 C) (Oral)  Resp 20  SpO2 100%  Physical Exam  Nursing note and vitals reviewed. Constitutional: He is oriented to person, place, and time. He appears well-developed and well-nourished.  HENT:  Head: Normocephalic and atraumatic.  Right Ear: External ear normal.  Left Ear: External ear normal.  Nose: Nose normal.  Mouth/Throat: Oropharynx is clear and moist.  Eyes: Conjunctivae and EOM are normal. Pupils are equal, round, and reactive to light.  Neck: Normal range of motion. Neck supple. No JVD present. No tracheal deviation present. No thyromegaly present.  Cardiovascular: Normal rate, regular rhythm, normal heart sounds and intact distal pulses.  Exam reveals no gallop and no friction rub.   No murmur heard. Pulmonary/Chest: Effort normal. No stridor. No respiratory distress. He has wheezes. He has no rales. He exhibits no tenderness.  Abdominal: Soft. Bowel sounds are normal. He exhibits no distension and no mass. There is no tenderness. There is no rebound and no guarding.  Musculoskeletal: Normal range of motion. He exhibits no edema and no tenderness.  Lymphadenopathy:    He has no cervical adenopathy.  Neurological: He is alert and  oriented to person, place, and time. A cranial nerve deficit is present. He exhibits normal muscle tone. Coordination normal.  Skin: Skin is warm and dry. No erythema. No pallor.  Psychiatric: He has a normal mood and affect. His behavior is normal. Judgment and thought content normal.    ED Course  Procedures (including critical care time)  Labs Reviewed  CBC WITH DIFFERENTIAL - Abnormal; Notable for the following:    RBC 5.85 (*)     Eosinophils Relative 8 (*)     All other components within normal limits  BASIC METABOLIC PANEL - Abnormal; Notable for the following:    Glucose, Bld 108 (*)     GFR calc non Af Amer 71 (*)     GFR calc Af Amer 82 (*)     All other components within normal limits  LAB REPORT - SCANNED   No results found.  Date: 02/08/2012  Rate: 59  Rhythm: sinus bradycardia  QRS Axis: normal  Intervals: normal  ST/T Wave abnormalities: nonspecific ST/T changes  Conduction Disutrbances:none  Narrative Interpretation: rate slower  Old EKG Reviewed: changes noted    1. Bronchospasm, acute   2. Hypertension       MDM  Pt much improved after albuterol/atrovent.  Will d/c home with albuterol and steroids.  Pt to f/u with Dr Ninetta Lights        Olivia Mackie, MD 02/13/12 [id number]  Olivia Mackie, MD 03/05/12 1435

## 2012-03-09 ENCOUNTER — Other Ambulatory Visit: Payer: Self-pay

## 2012-03-09 DIAGNOSIS — I10 Essential (primary) hypertension: Secondary | ICD-10-CM

## 2012-03-09 MED ORDER — CARVEDILOL 12.5 MG PO TABS: 12.5000 mg | ORAL_TABLET | Freq: Two times a day (BID) | ORAL | Status: DC

## 2012-03-09 NOTE — Telephone Encounter (Signed)
Pt called for Coreg refill. Pt advised he will need visit and lab results.  One month supply sent to pharmacy. Call transferred to reception to schedule.  Laurell Josephs, RN

## 2012-03-16 ENCOUNTER — Ambulatory Visit: Payer: Self-pay

## 2012-03-22 ENCOUNTER — Ambulatory Visit: Payer: Self-pay

## 2012-03-22 ENCOUNTER — Ambulatory Visit (HOSPITAL_BASED_OUTPATIENT_CLINIC_OR_DEPARTMENT_OTHER): Payer: Self-pay

## 2012-03-22 ENCOUNTER — Telehealth: Payer: Self-pay | Admitting: Oncology

## 2012-03-22 ENCOUNTER — Ambulatory Visit (HOSPITAL_BASED_OUTPATIENT_CLINIC_OR_DEPARTMENT_OTHER): Payer: Self-pay | Admitting: Oncology

## 2012-03-22 ENCOUNTER — Encounter: Payer: Self-pay | Admitting: Oncology

## 2012-03-22 ENCOUNTER — Other Ambulatory Visit: Payer: Self-pay | Admitting: Lab

## 2012-03-22 VITALS — BP 165/110 | HR 71 | Temp 98.9°F | Resp 20 | Ht 73.0 in | Wt 253.1 lb

## 2012-03-22 DIAGNOSIS — B20 Human immunodeficiency virus [HIV] disease: Secondary | ICD-10-CM

## 2012-03-22 DIAGNOSIS — C7952 Secondary malignant neoplasm of bone marrow: Secondary | ICD-10-CM

## 2012-03-22 DIAGNOSIS — C469 Kaposi's sarcoma, unspecified: Secondary | ICD-10-CM

## 2012-03-22 DIAGNOSIS — C7951 Secondary malignant neoplasm of bone: Secondary | ICD-10-CM

## 2012-03-22 LAB — CBC WITH DIFFERENTIAL/PLATELET
BASO%: 2.2 % — ABNORMAL HIGH (ref 0.0–2.0)
Eosinophils Absolute: 0.6 10*3/uL — ABNORMAL HIGH (ref 0.0–0.5)
LYMPH%: 31.3 % (ref 14.0–49.0)
MCHC: 32.2 g/dL (ref 32.0–36.0)
MCV: 86.7 fL (ref 79.3–98.0)
MONO%: 4.4 % (ref 0.0–14.0)
NEUT#: 2 10*3/uL (ref 1.5–6.5)
Platelets: 127 10*3/uL — ABNORMAL LOW (ref 140–400)
RBC: 5.54 10*6/uL (ref 4.20–5.82)
RDW: 14.4 % (ref 11.0–14.6)
WBC: 4.1 10*3/uL (ref 4.0–10.3)

## 2012-03-22 LAB — COMPREHENSIVE METABOLIC PANEL (CC13)
ALT: 38 U/L (ref 0–55)
AST: 36 U/L — ABNORMAL HIGH (ref 5–34)
Albumin: 4.7 g/dL (ref 3.5–5.0)
Alkaline Phosphatase: 50 U/L (ref 40–150)
Glucose: 90 mg/dl (ref 70–99)
Potassium: 4.2 mEq/L (ref 3.5–5.1)
Sodium: 140 mEq/L (ref 136–145)
Total Bilirubin: 1.6 mg/dL — ABNORMAL HIGH (ref 0.20–1.20)
Total Protein: 8.4 g/dL — ABNORMAL HIGH (ref 6.4–8.3)

## 2012-03-22 MED ORDER — HEPARIN SOD (PORK) LOCK FLUSH 100 UNIT/ML IV SOLN
500.0000 [IU] | Freq: Once | INTRAVENOUS | Status: AC
  Administered 2012-03-22: 500 [IU] via INTRAVENOUS
  Filled 2012-03-22: qty 5

## 2012-03-22 MED ORDER — HEPARIN SOD (PORK) LOCK FLUSH 100 UNIT/ML IV SOLN
500.0000 [IU] | Freq: Once | INTRAVENOUS | Status: DC
  Filled 2012-03-22: qty 5

## 2012-03-22 MED ORDER — SODIUM CHLORIDE 0.9 % IJ SOLN
10.0000 mL | INTRAMUSCULAR | Status: DC | PRN
  Administered 2012-03-22: 10 mL via INTRAVENOUS
  Filled 2012-03-22: qty 10

## 2012-03-22 MED ORDER — SODIUM CHLORIDE 0.9 % IJ SOLN
10.0000 mL | INTRAMUSCULAR | Status: DC | PRN
  Filled 2012-03-22: qty 10

## 2012-03-22 NOTE — Progress Notes (Signed)
OFFICE PROGRESS NOTE  CC Dr. 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  #2 bilateral subdural hematoma in August 2011 status post evacuation.  #3 HIV disease on highly active antiretroviral therapy or  PRIOR THERAPY: #1 metastatic Kaposi's sarcoma patient previously has been treated with chemotherapy including Taxol. He also received liposomal doxorubicin.  #2 unfortunately patient developed subdural hematoma and his chemotherapy was discontinued and he has been on observation only.  #3 patient has been on HART therapy for his HIV disease  CURRENT THERAPY:Observation for Kaposi sarcoma  INTERVAL HISTORY: Steven [name] 31 y.o. male returns for Followup visit.overall Steven [name] feels great. He is working. He occasionally does get tired and fatigued but he has not noticed any fevers chills night sweats. He has no lower extremity edema. She has no nausea or vomiting no abdominal pain he has not noticed any Kaposi's outbreaks on his skin. He has not noticed any lower extremity pain. No bleeding problems. He continues to be on his HART therapy. Remainder of the 10 point review of systems is negative.  MEDICAL HISTORY: Past Medical History  Diagnosis Date  . HIV positive 03/23/09    Genotype Y181C  . HTN (hypertension)   . Syphilis 03/23/09-    1:2  . Kaposi's sarcoma   . Cancer   . Asthma     ALLERGIES:  is allergic to penicillins and aspirin.  MEDICATIONS:  Current Outpatient Prescriptions  Medication Sig Dispense Refill  . acetaminophen (TYLENOL) 500 MG tablet Take 500 mg by mouth every 6 (six) hours as needed. Pain or fever      . albuterol (PROVENTIL HFA;VENTOLIN HFA) 108 (90 BASE) MCG/ACT inhaler Inhale 2 puffs into the lungs every 6 (six) hours as needed.        Marland Kitchen albuterol (PROVENTIL HFA;VENTOLIN HFA) 108 (90 BASE) MCG/ACT inhaler Inhale 2 puffs into the lungs every 4 (four)  hours as needed for wheezing or shortness of breath.  1 Inhaler  0  . azithromycin (ZITHROMAX) 600 MG tablet Take 1,200 mg by mouth every 7 (seven) days. Every Saturday      . emtricitabine-tenofovir (TRUVADA) 200-300 MG per tablet Take 1 tablet by mouth daily.  30 tablet  11  . fluticasone-salmeterol (ADVAIR HFA) 115-21 MCG/ACT inhaler Inhale 2 puffs into the lungs 2 (two) times daily.      Marland Kitchen levothyroxine (SYNTHROID) 175 MCG tablet Take 1 tablet (175 mcg total) by mouth daily.  90 tablet  3  . lisinopril-hydrochlorothiazide (PRINZIDE,ZESTORETIC) 20-12.5 MG per tablet Take 1 tablet by mouth daily.      . raltegravir (ISENTRESS) 400 MG tablet Take 1 tablet (400 mg total) by mouth 2 (two) times daily.  60 tablet  11  . sulfamethoxazole-trimethoprim (BACTRIM DS) 800-160 MG per tablet Take 1 tablet by mouth 3 (three) times a week. one tab by mouth every Monday, Wednesday & friday  30 tablet  6  . carvedilol (COREG) 12.5 MG tablet Take 1 tablet (12.5 mg total) by mouth 2 (two) times daily with a meal.  30 tablet  0    SURGICAL HISTORY: No past surgical history on file.  REVIEW OF SYSTEMS:  Pertinent items are noted in HPI.   PHYSICAL EXAMINATION:  HEENT exam: EOMI PERRLA sclerae anicteric no conjunctival pallor oral mucosa is moist neck is supple there is no palpable cervical supraclavicular or axillary adenopathy no inguinal adenopathy. Lungs: Scattered wheezes bilaterally no rales or rhonchi. Heart he  vascular: Regular rate rhythm no murmurs gallops or rubs abdomen: Soft nontender nondistended bowel sounds are present no hepatosplenomegaly. Extremities: Warm with 1+ edema no clubbing. Skin: There is noted to be cutaneous nodules on the right lower extremity measuring about 2 mm to 3 mm otherwise no other findings. Neuro: Patient's alert oriented x3 DTRs are +4 motor and sensory is intact strength is symmetrical.  ECOG PERFORMANCE STATUS: 0 - Asymptomatic  Blood pressure 165/110, pulse 71,  temperature 98.9 F (37.2 C), temperature source Oral, resp. rate 20, height 6\' 1"  (1.854 m), weight 253 lb 1.6 oz (114.805 kg).  LABORATORY DATA: Lab Results  Component Value Date   WBC 5.2 02/08/2012   HGB 15.9 02/08/2012   HCT 48.3 02/08/2012   MCV 82.6 02/08/2012   PLT 153 02/08/2012      Chemistry      Component Value Date/Time   NA 139 02/08/2012 0316   NA 133 05/04/2010 1254   K 4.0 02/08/2012 0316   K 4.1 05/04/2010 1254   CL 100 02/08/2012 0316   CL 100 05/04/2010 1254   CO2 30 02/08/2012 0316   CO2 29 05/04/2010 1254   BUN 12 02/08/2012 0316   BUN 12 05/04/2010 1254   CREATININE 1.31 02/08/2012 0316   CREATININE 1.31 09/05/2011 1517      Component Value Date/Time   CALCIUM 9.0 02/08/2012 0316   CALCIUM 8.9 05/04/2010 1254   ALKPHOS 73 11/04/2011 0919   ALKPHOS 110* 05/04/2010 1254   AST 41* 11/04/2011 0919   AST 23 05/04/2010 1254   ALT 42 11/04/2011 0919   BILITOT 0.8 11/04/2011 0919   BILITOT 1.10 05/04/2010 1254       RADIOGRAPHIC STUDIES:  No results found.  ASSESSMENT: 31 year old male with  #1 HIV disease on antiretrovirals therapy.  #2 Kaposi's sarcoma he has been heavily treated in the past. Right now I do think that his Kaposi's is silent he has no lower extremity edema which is a presentation.at this time he is overall stable he doesn't have any progressive disease.  PLAN:  #1 patient will continue to be followed every 6 months. On his next visit he will have restaging scans performed.  #2 he will also have port flushes every 2 months.  All questions were answered. The patient knows to call the clinic with any problems, questions or concerns. We can certainly see the patient much sooner if necessary.  I spent  25 minutes counseling the patient face to face. The total time spent in the appointment was 30 minutes.    Drue Second, MD Medical/Oncology [facility] [phone] (beeper) [phone] (Office)  03/22/2012, 12:12 PM

## 2012-03-22 NOTE — Patient Instructions (Addendum)
Restaging staging scans prior to next visit  Port flushes every 2 months  I will see you back in March 2014 after scans

## 2012-03-22 NOTE — Telephone Encounter (Signed)
gv pt appt schedule for November 2013 thru August 2014 including scan March 2014.

## 2012-03-27 ENCOUNTER — Other Ambulatory Visit: Payer: Self-pay | Admitting: *Deleted

## 2012-03-27 DIAGNOSIS — Z113 Encounter for screening for infections with a predominantly sexual mode of transmission: Secondary | ICD-10-CM

## 2012-03-29 ENCOUNTER — Other Ambulatory Visit (INDEPENDENT_AMBULATORY_CARE_PROVIDER_SITE_OTHER): Payer: Self-pay

## 2012-03-29 ENCOUNTER — Other Ambulatory Visit: Payer: Self-pay | Admitting: Oncology

## 2012-03-29 DIAGNOSIS — B20 Human immunodeficiency virus [HIV] disease: Secondary | ICD-10-CM

## 2012-03-29 DIAGNOSIS — Z113 Encounter for screening for infections with a predominantly sexual mode of transmission: Secondary | ICD-10-CM

## 2012-03-29 LAB — COMPREHENSIVE METABOLIC PANEL
AST: 25 U/L (ref 0–37)
Albumin: 4.7 g/dL (ref 3.5–5.2)
Alkaline Phosphatase: 40 U/L (ref 39–117)
BUN: 18 mg/dL (ref 6–23)
Creat: 1.5 mg/dL — ABNORMAL HIGH (ref 0.50–1.35)
Glucose, Bld: 81 mg/dL (ref 70–99)
Potassium: 3.9 mEq/L (ref 3.5–5.3)

## 2012-03-29 LAB — CBC
HCT: 46 % (ref 39.0–52.0)
Hemoglobin: 15.3 g/dL (ref 13.0–17.0)
MCH: 27.7 pg (ref 26.0–34.0)
MCHC: 33.3 g/dL (ref 30.0–36.0)
MCV: 83.3 fL (ref 78.0–100.0)
RDW: 14.9 % (ref 11.5–15.5)

## 2012-03-30 LAB — T-HELPER CELL (CD4) - (RCID CLINIC ONLY): CD4 T Cell Abs: 170 uL — ABNORMAL LOW (ref 400–2700)

## 2012-03-30 LAB — HEPATITIS B SURFACE ANTIBODY,QUALITATIVE: Hep B S Ab: NEGATIVE

## 2012-03-30 LAB — HEPATITIS A ANTIBODY, TOTAL: Hep A Total Ab: NEGATIVE

## 2012-03-30 LAB — HEPATITIS B SURFACE ANTIGEN: Hepatitis B Surface Ag: NEGATIVE

## 2012-04-12 ENCOUNTER — Encounter: Payer: Self-pay | Admitting: Infectious Diseases

## 2012-04-12 ENCOUNTER — Ambulatory Visit (INDEPENDENT_AMBULATORY_CARE_PROVIDER_SITE_OTHER): Payer: Self-pay | Admitting: Infectious Diseases

## 2012-04-12 VITALS — BP 154/117 | HR 60 | Temp 98.5°F | Wt 260.0 lb

## 2012-04-12 DIAGNOSIS — B20 Human immunodeficiency virus [HIV] disease: Secondary | ICD-10-CM

## 2012-04-12 DIAGNOSIS — Z113 Encounter for screening for infections with a predominantly sexual mode of transmission: Secondary | ICD-10-CM

## 2012-04-12 DIAGNOSIS — C469 Kaposi's sarcoma, unspecified: Secondary | ICD-10-CM

## 2012-04-12 DIAGNOSIS — E0590 Thyrotoxicosis, unspecified without thyrotoxic crisis or storm: Secondary | ICD-10-CM

## 2012-04-12 DIAGNOSIS — Z23 Encounter for immunization: Secondary | ICD-10-CM

## 2012-04-12 DIAGNOSIS — N289 Disorder of kidney and ureter, unspecified: Secondary | ICD-10-CM

## 2012-04-12 DIAGNOSIS — Z79899 Other long term (current) drug therapy: Secondary | ICD-10-CM

## 2012-04-12 NOTE — Progress Notes (Signed)
  Subjective:    Patient ID: Steven [name], male    DOB: 06-19-1981, 31 y.o.   MRN: [id number]  HPI 31 yo M with AIDS, KS, prev CNS bleed due to thrombocytopenia from CTX, asthma and graves disease. Also has been having worsening Cr (most recent 1.5).  Has been using albuterol lately as well as advair. Is wondering if his BP is related to this.  Has some days with feeling drained. Wonders if related to his Graves. Has f/u in 8 weeks.  Has had multiple Onc visits- will have f/u scans in early 2014. So far looks good. Still has port, no problems with this.   HIV 1 RNA Quant (copies/mL)  Date Value  03/29/2012 <20   09/05/2011 <20   03/29/2011 <20      CD4 T Cell Abs (cmm)  Date Value  03/29/2012 170*  09/05/2011 160*  03/26/2011 30*      Review of Systems  Constitutional: Negative for appetite change and unexpected weight change.       Has gained some wt with recent prednisone tapers  Eyes: Negative for visual disturbance.  Respiratory: Negative for shortness of breath.        Occas has wheezing when he over-works himself.   Gastrointestinal: Negative for diarrhea and constipation.  Genitourinary: Negative for dysuria.       Objective:   Physical Exam  Constitutional: He appears well-developed and well-nourished.  HENT:  Head:    Eyes: EOM are normal. Pupils are equal, round, and reactive to light.  Neck: Neck supple.  Cardiovascular: Normal rate, regular rhythm and normal heart sounds.   Pulmonary/Chest: Effort normal and breath sounds normal.  Abdominal: Soft. Bowel sounds are normal. There is no tenderness.  Lymphadenopathy:    He has no cervical adenopathy.  Skin:       ~1 cm purplish lesion on his R biceps.           Assessment & Plan:

## 2012-04-12 NOTE — Assessment & Plan Note (Signed)
Will cont to f//u with endo. My appreciation to them.

## 2012-04-12 NOTE — Assessment & Plan Note (Signed)
Not sure if do to TFV, HTN or bactrim. Will continue to watch, greatly appreciate his PCP. Will check HLA testing to see if we can change him off TFV.

## 2012-04-12 NOTE — Assessment & Plan Note (Signed)
He's doing well. Will stop azithro. Is taking bactrim TIW. Gets flu shot today. Offered/refused condoms. Will have him come back in 6 months with labs. Will check [id number] to see if he can be changed to ABC if his Cr continues to deteriorate.

## 2012-04-12 NOTE — Assessment & Plan Note (Signed)
My great appreciation to Dr Park Breed. He will cont to f/u with her. Will cont on his ART, hopefully CD4 will continue to improve.

## 2012-04-19 ENCOUNTER — Telehealth: Payer: Self-pay | Admitting: *Deleted

## 2012-04-19 NOTE — Telephone Encounter (Signed)
Because the patient is uninsured, in order to get the patient a dermatology appt for his skin tag had to call [facility] and was advised that they do not have any open slots for derm for October and to hold the referral til next month. Will call the patient and advise him of this and keep the referral open until then.  Called the patient and had to leave him a message to call the office at which time I will inform him of the situation.

## 2012-05-03 ENCOUNTER — Encounter (HOSPITAL_COMMUNITY): Payer: Self-pay

## 2012-05-03 ENCOUNTER — Emergency Department (INDEPENDENT_AMBULATORY_CARE_PROVIDER_SITE_OTHER)
Admission: EM | Admit: 2012-05-03 | Discharge: 2012-05-03 | Disposition: A | Discharge status: Home / Self Care | Payer: Self-pay | Source: Home / Self Care | Attending: Emergency Medicine | Admitting: Emergency Medicine

## 2012-05-03 DIAGNOSIS — I10 Essential (primary) hypertension: Secondary | ICD-10-CM

## 2012-05-03 DIAGNOSIS — T783XXA Angioneurotic edema, initial encounter: Secondary | ICD-10-CM

## 2012-05-03 MED ORDER — AMLODIPINE BESYLATE 10 MG PO TABS: 10.0000 mg | ORAL_TABLET | Freq: Every day | ORAL | Status: DC

## 2012-05-03 MED ORDER — METHYLPREDNISOLONE ACETATE 80 MG/ML IJ SUSP
80.0000 mg | Freq: Once | INTRAMUSCULAR | Status: AC
  Administered 2012-05-03: 80 mg via INTRAMUSCULAR

## 2012-05-03 MED ORDER — METHYLPREDNISOLONE ACETATE 80 MG/ML IJ SUSP
INTRAMUSCULAR | Status: AC
  Filled 2012-05-03: qty 1

## 2012-05-03 MED ORDER — FEXOFENADINE HCL 180 MG PO TABS: 180.0000 mg | ORAL_TABLET | Freq: Every day | ORAL | Status: DC

## 2012-05-03 MED ORDER — HYDROCHLOROTHIAZIDE 25 MG PO TABS: 25.0000 mg | ORAL_TABLET | Freq: Every day | ORAL | Status: DC

## 2012-05-03 MED ORDER — PREDNISONE 5 MG PO KIT: 1.0000 | PACK | Freq: Every day | ORAL | Status: DC

## 2012-05-03 NOTE — ED Provider Notes (Signed)
Chief Complaint  Patient presents with  . Oral Swelling    History of Present Illness:   Steven [name] is a 31 year old HIV-positive male who today experienced swelling of his right lower lip. This was present when he woke up. He not been bitten or stung by anything. He denies any new medications although has been on lisinopril/HCTZ for the past month. Prior to that he was on carvedilol but had to be switched because of treatment for his asthma. He denies any swelling of the tongue or throat, shortness of breath, chest tightness, or wheezing. He denies any skin rash or itching. He's had no suspicious ingestions or other new medications.  Review of Systems:  Other than noted above, the patient denies any of the following symptoms: Systemic:  No fever, chills, sweats, weight loss, or fatigue. ENT:  No nasal congestion, rhinorrhea, sore throat, swelling of lips, tongue or throat. Resp:  No cough, wheezing, or shortness of breath. Skin:  No rash, itching, nodules, or suspicious lesions.  PMFSH:  Past medical history, family history, social history, meds, and allergies were reviewed.  Physical Exam:   Vital signs:  BP 156/100  Pulse 75  Temp 98.8 F (37.1 C) (Oral)  Resp 16  SpO2 97% Gen:  Alert, oriented, in no distress. ENT:  Pharynx clear, no intraoral lesions, moist mucous membranes. Lungs:  Clear to auscultation. Skin:  He has mild swelling of the right lower lip. This is nontender to palpation. Skin was otherwise clear.  Course in Urgent Care Center:   He was given Depo-Medrol 80 mg IM and tolerated this well without any immediate side effects.  Assessment:  The primary encounter diagnosis was Angioedema. A diagnosis of Hypertension was also pertinent to this visit.  This is almost certainly due to the lisinopril. I have told to stop that medication and has switched him to hydrochlorothiazide and amlodipine for his pressure. I suggested he get his blood pressure check in about 2 weeks. He  should return by ambulance if he develops any progression the swelling particularly with swelling of the tongue the throat or difficulty breathing. Lisinopril was added to his allergy list.  Plan:   1.  The following meds were prescribed:   New Prescriptions   AMLODIPINE (NORVASC) 10 MG TABLET    Take 1 tablet (10 mg total) by mouth daily.   FEXOFENADINE (ALLEGRA) 180 MG TABLET    Take 1 tablet (180 mg total) by mouth daily.   HYDROCHLOROTHIAZIDE (HYDRODIURIL) 25 MG TABLET    Take 1 tablet (25 mg total) by mouth daily.   PREDNISONE 5 MG KIT    Take 1 kit (5 mg total) by mouth daily after breakfast. Prednisone 5 mg 6 day dosepack.  Take as directed.   2.  The patient was instructed in symptomatic care and handouts were given. 3.  The patient was told to return if becoming worse in any way, if no better in 3 or 4 days, and given some red flag symptoms that would indicate earlier return.     Reuben Likes, MD 05/03/12 1019

## 2012-05-03 NOTE — ED Notes (Signed)
Patient states that his lip started swelling at approx 2 am, states that he has no other sx

## 2012-05-05 ENCOUNTER — Other Ambulatory Visit: Payer: Self-pay | Admitting: Infectious Diseases

## 2012-05-07 ENCOUNTER — Other Ambulatory Visit: Payer: Self-pay | Admitting: *Deleted

## 2012-05-07 ENCOUNTER — Telehealth: Payer: Self-pay | Admitting: *Deleted

## 2012-05-07 DIAGNOSIS — B20 Human immunodeficiency virus [HIV] disease: Secondary | ICD-10-CM

## 2012-05-07 MED ORDER — SULFAMETHOXAZOLE-TMP DS 800-160 MG PO TABS: 1.0000 | ORAL_TABLET | Freq: Every day | ORAL | Status: DC

## 2012-05-07 MED ORDER — RALTEGRAVIR POTASSIUM 400 MG PO TABS: 400.0000 mg | ORAL_TABLET | Freq: Two times a day (BID) | ORAL | Status: DC

## 2012-05-07 MED ORDER — EMTRICITABINE-TENOFOVIR DF 200-300 MG PO TABS: 1.0000 | ORAL_TABLET | Freq: Every day | ORAL | Status: DC

## 2012-05-07 NOTE — Telephone Encounter (Signed)
Refer patient to [facility] in November for dermatology Wendall Mola

## 2012-05-15 ENCOUNTER — Telehealth: Payer: Self-pay | Admitting: *Deleted

## 2012-05-15 NOTE — Telephone Encounter (Signed)
Called patient and notified of appt with dermatology for 05/17/12 at 1:45 pm with Dr. Hoyle Sauer.  He was already notified by [facility] and has the address and appt information. Wendall Mola CMA

## 2012-06-02 ENCOUNTER — Other Ambulatory Visit: Payer: Self-pay | Admitting: Infectious Diseases

## 2012-06-30 ENCOUNTER — Other Ambulatory Visit: Payer: Self-pay | Admitting: Infectious Diseases

## 2012-07-31 ENCOUNTER — Encounter (HOSPITAL_COMMUNITY): Payer: Self-pay

## 2012-07-31 ENCOUNTER — Emergency Department (INDEPENDENT_AMBULATORY_CARE_PROVIDER_SITE_OTHER)
Admission: EM | Admit: 2012-07-31 | Discharge: 2012-07-31 | Disposition: A | Discharge status: Home / Self Care | Payer: No Typology Code available for payment source | Source: Home / Self Care

## 2012-07-31 DIAGNOSIS — N289 Disorder of kidney and ureter, unspecified: Secondary | ICD-10-CM

## 2012-07-31 DIAGNOSIS — C469 Kaposi's sarcoma, unspecified: Secondary | ICD-10-CM

## 2012-07-31 DIAGNOSIS — E0590 Thyrotoxicosis, unspecified without thyrotoxic crisis or storm: Secondary | ICD-10-CM

## 2012-07-31 DIAGNOSIS — I10 Essential (primary) hypertension: Secondary | ICD-10-CM

## 2012-07-31 MED ORDER — AMLODIPINE BESYLATE 10 MG PO TABS: 10.0000 mg | ORAL_TABLET | Freq: Every day | ORAL | Status: DC

## 2012-07-31 MED ORDER — HYDROCHLOROTHIAZIDE 25 MG PO TABS: 25.0000 mg | ORAL_TABLET | Freq: Every day | ORAL | Status: DC

## 2012-07-31 NOTE — ED Notes (Signed)
Patient states has a history of HIV positive, hypertension, graves disease, bone cancer(in remission)

## 2012-07-31 NOTE — ED Provider Notes (Signed)
History     CSN: [id number]  Arrival date & time 07/31/12  1046   Chief Complaint  Patient presents with  . Medication Refill   HPI Pt reports that he needs refills on his blood pressure medications.  He says that he has been out of them for about 1 day.  He has no complaints.  He has a history of graves disease.    Past Medical History  Diagnosis Date  . HIV positive 03/23/09    Genotype Y181C  . HTN (hypertension)   . Syphilis 03/23/09-    1:2  . Kaposi's sarcoma   . Cancer   . Asthma     History reviewed. No pertinent past surgical history.  Family History  Problem Relation Age of Onset  . Pancreatic cancer Mother     History  Substance Use Topics  . Smoking status: Never Smoker   . Smokeless tobacco: Never Used  . Alcohol Use: No     Review of Systems  HENT: Positive for congestion and rhinorrhea.   Musculoskeletal: Positive for arthralgias.  All other systems reviewed and are negative.    Allergies  Lisinopril; Penicillins; and Aspirin  Home Medications   Current Outpatient Rx  Name  Route  Sig  Dispense  Refill  . ACETAMINOPHEN 500 MG PO TABS   Oral   Take 500 mg by mouth every 6 (six) hours as needed. Pain or fever         . ALBUTEROL SULFATE HFA 108 (90 BASE) MCG/ACT IN AERS   Inhalation   Inhale 2 puffs into the lungs every 6 (six) hours as needed.           . ALBUTEROL SULFATE HFA 108 (90 BASE) MCG/ACT IN AERS   Inhalation   Inhale 2 puffs into the lungs every 4 (four) hours as needed for wheezing or shortness of breath.   1 Inhaler   0   . AMLODIPINE BESYLATE 10 MG PO TABS   Oral   Take 1 tablet (10 mg total) by mouth daily.   30 tablet   2   . AZITHROMYCIN 600 MG PO TABS      TAKE 2 TABLETS BY MOUTH EVERY 7 DAYS   10 tablet   0   . CARVEDILOL 12.5 MG PO TABS   Oral   Take 1 tablet (12.5 mg total) by mouth 2 (two) times daily with a meal.   30 tablet   0   . EMTRICITABINE-TENOFOVIR 200-300 MG PO TABS   Oral   Take 1  tablet by mouth daily.   30 tablet   6   . FEXOFENADINE HCL 180 MG PO TABS   Oral   Take 1 tablet (180 mg total) by mouth daily.   15 tablet   0   . FLUTICASONE-SALMETEROL 115-21 MCG/ACT IN AERO   Inhalation   Inhale 2 puffs into the lungs 2 (two) times daily.         Marland Kitchen HYDROCHLOROTHIAZIDE 25 MG PO TABS   Oral   Take 1 tablet (25 mg total) by mouth daily.   30 tablet   2   . LEVOTHYROXINE SODIUM 175 MCG PO TABS   Oral   Take 1 tablet (175 mcg total) by mouth daily.   90 tablet   3   . PREDNISONE 5 MG PO KIT   Oral   Take 1 kit (5 mg total) by mouth daily after breakfast. Prednisone 5 mg 6 day dosepack.  Take  as directed.   1 kit   0   . RALTEGRAVIR POTASSIUM 400 MG PO TABS   Oral   Take 1 tablet (400 mg total) by mouth 2 (two) times daily.   60 tablet   11   . RALTEGRAVIR POTASSIUM 400 MG PO TABS   Oral   Take 1 tablet (400 mg total) by mouth 2 (two) times daily.   60 tablet   6   . SULFAMETHOXAZOLE-TMP DS 800-160 MG PO TABS   Oral   Take 1 tablet by mouth daily.   30 tablet   6     BP 147/76  Pulse 69  Temp 98.4 F (36.9 C) (Oral)  Resp 18  SpO2 97%  Physical Exam  Nursing note and vitals reviewed. Constitutional: He is oriented to person, place, and time. He appears well-developed and well-nourished. No distress.  HENT:  Head: Normocephalic and atraumatic.  Eyes: EOM are normal. Pupils are equal, round, and reactive to light.  Neck: Normal range of motion. Neck supple. No thyromegaly present.  Cardiovascular: Normal rate, regular rhythm and normal heart sounds.   Pulmonary/Chest: Effort normal and breath sounds normal. No respiratory distress.  Abdominal: Soft. Bowel sounds are normal.  Musculoskeletal: Normal range of motion. He exhibits no edema.  Lymphadenopathy:    He has no cervical adenopathy.  Neurological: He is alert and oriented to person, place, and time.  Skin: Skin is warm and dry.  Psychiatric: He has a normal mood and  affect. His behavior is normal. Judgment and thought content normal.    ED Course  Procedures (including critical care time)  Labs Reviewed - No data to display No results found.   No diagnosis found.   MDM  IMPRESSION  CRI  HTN  Allergic Rhinitis  RECOMMENDATIONS / PLAN Refilled his medications today. The patient reports that he is scheduled to have labs done with his other specialists in the next month.   FOLLOW UP 3 months  The patient was given clear instructions to go to ER or return to medical center if symptoms don't improve, worsen or new problems develop.  The patient verbalized understanding.  The patient was told to call to get lab results if they haven't heard anything in the next week.            Cleora Fleet, MD 07/31/12 1409

## 2012-08-30 ENCOUNTER — Other Ambulatory Visit: Payer: Self-pay | Admitting: Infectious Diseases

## 2012-08-30 NOTE — Telephone Encounter (Signed)
Please advise - per your last office note, you wanted him to stop Azithromycin and continue Bactrim TIW.  It seems the opposite has happened. Thank you!

## 2012-09-06 ENCOUNTER — Ambulatory Visit (HOSPITAL_BASED_OUTPATIENT_CLINIC_OR_DEPARTMENT_OTHER): Payer: No Typology Code available for payment source

## 2012-09-06 VITALS — BP 142/63 | HR 92 | Temp 98.9°F

## 2012-09-06 DIAGNOSIS — C7951 Secondary malignant neoplasm of bone: Secondary | ICD-10-CM

## 2012-09-06 DIAGNOSIS — C469 Kaposi's sarcoma, unspecified: Secondary | ICD-10-CM

## 2012-09-06 DIAGNOSIS — C7952 Secondary malignant neoplasm of bone marrow: Secondary | ICD-10-CM

## 2012-09-06 DIAGNOSIS — Z452 Encounter for adjustment and management of vascular access device: Secondary | ICD-10-CM

## 2012-09-06 MED ORDER — HEPARIN SOD (PORK) LOCK FLUSH 100 UNIT/ML IV SOLN
500.0000 [IU] | Freq: Once | INTRAVENOUS | Status: AC
  Administered 2012-09-06: 500 [IU] via INTRAVENOUS
  Filled 2012-09-06: qty 5

## 2012-09-06 MED ORDER — SODIUM CHLORIDE 0.9 % IJ SOLN
10.0000 mL | INTRAMUSCULAR | Status: DC | PRN
  Administered 2012-09-06: 10 mL via INTRAVENOUS
  Filled 2012-09-06: qty 10

## 2012-09-10 ENCOUNTER — Encounter (HOSPITAL_COMMUNITY): Payer: Self-pay | Admitting: *Deleted

## 2012-09-10 ENCOUNTER — Ambulatory Visit: Payer: No Typology Code available for payment source

## 2012-09-10 ENCOUNTER — Emergency Department (HOSPITAL_COMMUNITY)
Admission: EM | Admit: 2012-09-10 | Discharge: 2012-09-10 | Disposition: A | Discharge status: Home / Self Care | Payer: No Typology Code available for payment source | Source: Home / Self Care

## 2012-09-10 DIAGNOSIS — J45909 Unspecified asthma, uncomplicated: Secondary | ICD-10-CM

## 2012-09-10 MED ORDER — ALBUTEROL SULFATE HFA 108 (90 BASE) MCG/ACT IN AERS: 2.0000 | INHALATION_SPRAY | Freq: Four times a day (QID) | RESPIRATORY_TRACT | Status: DC | PRN

## 2012-09-10 MED ORDER — FLUTICASONE-SALMETEROL 115-21 MCG/ACT IN AERO: 2.0000 | INHALATION_SPRAY | Freq: Two times a day (BID) | RESPIRATORY_TRACT | Status: DC

## 2012-09-10 NOTE — ED Provider Notes (Signed)
History     CSN: [id number]  Arrival date & time 09/10/12  5839  32 year old male presents to the urgent care for medication refill. He has a history of AIDS, KS, prev CNS bleed due to thrombocytopenia from CTX, asthma and graves disease. He follows up with Dr. Joyce Gross for his thyroid issues, Dr. Ninetta Lights for his HIV. The patient has had some wheezing over last couple of days because he ran out of his Advair otherwise doing fine     Chief Complaint  Patient presents with  . Medication Refill    (Consider location/radiation/quality/duration/timing/severity/associated sxs/prior treatment) HPI  Past Medical History  Diagnosis Date  . HIV positive 03/23/09    Genotype Y181C  . HTN (hypertension)   . Syphilis 03/23/09-    1:2  . Kaposi's sarcoma   . Cancer   . Asthma     History reviewed. No pertinent past surgical history.  Family History  Problem Relation Age of Onset  . Pancreatic cancer Mother     History  Substance Use Topics  . Smoking status: Never Smoker   . Smokeless tobacco: Never Used  . Alcohol Use: No      Review of Systems  Allergies  Lisinopril; Penicillins; and Aspirin  Home Medications   Current Outpatient Rx  Name  Route  Sig  Dispense  Refill  . acetaminophen (TYLENOL) 500 MG tablet   Oral   Take 500 mg by mouth every 6 (six) hours as needed. Pain or fever         . albuterol (PROVENTIL HFA;VENTOLIN HFA) 108 (90 BASE) MCG/ACT inhaler   Inhalation   Inhale 2 puffs into the lungs every 4 (four) hours as needed for wheezing or shortness of breath.   1 Inhaler   0   . albuterol (PROVENTIL HFA;VENTOLIN HFA) 108 (90 BASE) MCG/ACT inhaler   Inhalation   Inhale 2 puffs into the lungs every 6 (six) hours as needed.   1 Inhaler   6   . amLODipine (NORVASC) 10 MG tablet   Oral   Take 1 tablet (10 mg total) by mouth daily.   30 tablet   3   . emtricitabine-tenofovir (TRUVADA) 200-300 MG per tablet   Oral   Take 1 tablet by mouth  daily.   30 tablet   6   . fexofenadine (ALLEGRA) 180 MG tablet   Oral   Take 1 tablet (180 mg total) by mouth daily.   15 tablet   0   . fluticasone-salmeterol (ADVAIR HFA) 115-21 MCG/ACT inhaler   Inhalation   Inhale 2 puffs into the lungs 2 (two) times daily.   1 Inhaler   12   . hydrochlorothiazide (HYDRODIURIL) 25 MG tablet   Oral   Take 1 tablet (25 mg total) by mouth daily.   30 tablet   3   . levothyroxine (SYNTHROID) 175 MCG tablet   Oral   Take 1 tablet (175 mcg total) by mouth daily.   90 tablet   3   . PredniSONE 5 MG KIT   Oral   Take 1 kit (5 mg total) by mouth daily after breakfast. Prednisone 5 mg 6 day dosepack.  Take as directed.   1 kit   0   . raltegravir (ISENTRESS) 400 MG tablet   Oral   Take 1 tablet (400 mg total) by mouth 2 (two) times daily.   60 tablet   11   . raltegravir (ISENTRESS) 400 MG tablet   Oral  Take 1 tablet (400 mg total) by mouth 2 (two) times daily.   60 tablet   6   . sulfamethoxazole-trimethoprim (BACTRIM DS) 800-160 MG per tablet   Oral   Take 1 tablet by mouth daily.   30 tablet   6     BP 141/79  Pulse 77  Temp(Src) 99 F (37.2 C) (Oral)  Resp 16  SpO2 99%  Physical Exam Eyes: EOM are normal. Pupils are equal, round, and reactive to light.  Neck: Neck supple.  Cardiovascular: Normal rate, regular rhythm and normal heart sounds.  Pulmonary/Chest: Effort normal and breath sounds normal.  Abdominal: Soft. Bowel sounds are normal. There is no tenderness.  Lymphadenopathy:  He has no cervical adenopathy.   ED Course  Procedures (including critical care time)  Labs Reviewed - No data to display No results found.   No diagnosis found.    MDM  Refill Advair, refill albuterol, followup in 3 months  The patient was given clear instructions to go to ER or return to medical center if symptoms don't improve, worsen or new problems develop. The patient verbalized understanding.       Richarda Overlie, MD 09/10/12 1255

## 2012-09-10 NOTE — ED Notes (Signed)
Patient states is here for refill of advair; FU hypertension.

## 2012-09-17 ENCOUNTER — Ambulatory Visit: Payer: Self-pay

## 2012-09-20 ENCOUNTER — Other Ambulatory Visit (HOSPITAL_BASED_OUTPATIENT_CLINIC_OR_DEPARTMENT_OTHER): Payer: No Typology Code available for payment source | Admitting: Lab

## 2012-09-20 ENCOUNTER — Encounter (HOSPITAL_COMMUNITY): Payer: Self-pay

## 2012-09-20 ENCOUNTER — Ambulatory Visit (HOSPITAL_COMMUNITY)
Admission: RE | Admit: 2012-09-20 | Discharge: 2012-09-20 | Disposition: A | Discharge status: Home / Self Care | Payer: No Typology Code available for payment source | Source: Ambulatory Visit | Attending: Oncology | Admitting: Oncology

## 2012-09-20 DIAGNOSIS — C469 Kaposi's sarcoma, unspecified: Secondary | ICD-10-CM

## 2012-09-20 DIAGNOSIS — N281 Cyst of kidney, acquired: Secondary | ICD-10-CM | POA: Insufficient documentation

## 2012-09-20 DIAGNOSIS — C7951 Secondary malignant neoplasm of bone: Secondary | ICD-10-CM | POA: Insufficient documentation

## 2012-09-20 LAB — CBC WITH DIFFERENTIAL/PLATELET
BASO%: 1 % (ref 0.0–2.0)
EOS%: 8.1 % — ABNORMAL HIGH (ref 0.0–7.0)
MCH: 25.9 pg — ABNORMAL LOW (ref 27.2–33.4)
MCHC: 31.8 g/dL — ABNORMAL LOW (ref 32.0–36.0)
MONO#: 0.3 10*3/uL (ref 0.1–0.9)
RBC: 6.4 10*6/uL — ABNORMAL HIGH (ref 4.20–5.82)
WBC: 4.5 10*3/uL (ref 4.0–10.3)
lymph#: 1.4 10*3/uL (ref 0.9–3.3)

## 2012-09-20 LAB — LACTATE DEHYDROGENASE (CC13): LDH: 164 U/L (ref 125–245)

## 2012-09-20 LAB — COMPREHENSIVE METABOLIC PANEL (CC13)
ALT: 26 U/L (ref 0–55)
AST: 26 U/L (ref 5–34)
CO2: 29 mEq/L (ref 22–29)
Calcium: 9.7 mg/dL (ref 8.4–10.4)
Chloride: 101 mEq/L (ref 98–107)
Sodium: 141 mEq/L (ref 136–145)
Total Bilirubin: 1.6 mg/dL — ABNORMAL HIGH (ref 0.20–1.20)
Total Protein: 8.3 g/dL (ref 6.4–8.3)

## 2012-09-20 MED ORDER — IOHEXOL 300 MG/ML  SOLN
100.0000 mL | Freq: Once | INTRAMUSCULAR | Status: AC | PRN
  Administered 2012-09-20: 100 mL via INTRAVENOUS

## 2012-09-26 ENCOUNTER — Other Ambulatory Visit: Payer: Self-pay

## 2012-09-28 ENCOUNTER — Telehealth: Payer: Self-pay | Admitting: *Deleted

## 2012-09-28 ENCOUNTER — Ambulatory Visit: Payer: Self-pay | Admitting: Oncology

## 2012-09-28 NOTE — Telephone Encounter (Signed)
Called pt to cancel appt with Dr. Welton Flakes d/t illness.  Informed pt that we will call him back with new appt date and time.

## 2012-10-08 ENCOUNTER — Other Ambulatory Visit (INDEPENDENT_AMBULATORY_CARE_PROVIDER_SITE_OTHER): Payer: No Typology Code available for payment source

## 2012-10-08 DIAGNOSIS — Z79899 Other long term (current) drug therapy: Secondary | ICD-10-CM

## 2012-10-08 DIAGNOSIS — Z113 Encounter for screening for infections with a predominantly sexual mode of transmission: Secondary | ICD-10-CM

## 2012-10-08 DIAGNOSIS — B20 Human immunodeficiency virus [HIV] disease: Secondary | ICD-10-CM

## 2012-10-08 LAB — LIPID PANEL
Cholesterol: 146 mg/dL (ref 0–200)
LDL Cholesterol: 70 mg/dL (ref 0–99)
Total CHOL/HDL Ratio: 3.7 Ratio
Triglycerides: 182 mg/dL — ABNORMAL HIGH (ref <150)
VLDL: 36 mg/dL (ref 0–40)

## 2012-10-08 LAB — COMPREHENSIVE METABOLIC PANEL
ALT: 27 U/L (ref 0–53)
Alkaline Phosphatase: 65 U/L (ref 39–117)
Creat: 1.3 mg/dL (ref 0.50–1.35)
Glucose, Bld: 109 mg/dL — ABNORMAL HIGH (ref 70–99)
Sodium: 139 mEq/L (ref 135–145)
Total Bilirubin: 0.9 mg/dL (ref 0.3–1.2)
Total Protein: 7.4 g/dL (ref 6.0–8.3)

## 2012-10-08 LAB — CBC
MCH: 26.4 pg (ref 26.0–34.0)
MCHC: 32.4 g/dL (ref 30.0–36.0)
MCV: 81.6 fL (ref 78.0–100.0)
Platelets: 200 10*3/uL (ref 150–400)

## 2012-10-09 LAB — T-HELPER CELL (CD4) - (RCID CLINIC ONLY): CD4 % Helper T Cell: 13 % — ABNORMAL LOW (ref 33–55)

## 2012-10-10 ENCOUNTER — Ambulatory Visit: Payer: Self-pay | Admitting: Infectious Diseases

## 2012-10-10 LAB — HIV-1 RNA QUANT-NO REFLEX-BLD: HIV 1 RNA Quant: <20 copies/mL (ref <20)

## 2012-10-15 LAB — HLA B*5701: HLA-B*5701: NEGATIVE

## 2012-10-22 ENCOUNTER — Ambulatory Visit (INDEPENDENT_AMBULATORY_CARE_PROVIDER_SITE_OTHER): Payer: No Typology Code available for payment source | Admitting: Infectious Diseases

## 2012-10-22 ENCOUNTER — Encounter: Payer: Self-pay | Admitting: Infectious Diseases

## 2012-10-22 VITALS — BP 149/94 | HR 91 | Temp 98.6°F | Ht 73.0 in | Wt 289.0 lb

## 2012-10-22 DIAGNOSIS — Z23 Encounter for immunization: Secondary | ICD-10-CM

## 2012-10-22 DIAGNOSIS — I10 Essential (primary) hypertension: Secondary | ICD-10-CM

## 2012-10-22 DIAGNOSIS — C469 Kaposi's sarcoma, unspecified: Secondary | ICD-10-CM

## 2012-10-22 DIAGNOSIS — B20 Human immunodeficiency virus [HIV] disease: Secondary | ICD-10-CM

## 2012-10-22 DIAGNOSIS — E0590 Thyrotoxicosis, unspecified without thyrotoxic crisis or storm: Secondary | ICD-10-CM

## 2012-10-22 NOTE — Addendum Note (Signed)
Addended by: Wendall Mola A on: 10/22/2012 04:37 PM   Modules accepted: Orders

## 2012-10-22 NOTE — Assessment & Plan Note (Signed)
Borderline. Will continue to watch. Watch his Cr as well.

## 2012-10-22 NOTE — Assessment & Plan Note (Signed)
Greatly appreciate Dr Santo Held f/u. Encouraged byhis recent CT scans.

## 2012-10-22 NOTE — Assessment & Plan Note (Addendum)
Some concern about his wt gain. His Cr has improved, will need to continue to watch closely. States he was recently started on diuretic. Will see him back 6 months. Will start Hep A/B series.

## 2012-10-22 NOTE — Progress Notes (Signed)
  Subjective:    Patient ID: Steven [name], male    DOB: 02-Sep-1980, 32 y.o.   MRN: [id number]  HPI 32 yo M with AIDS, KS, prev CNS bleed due to thrombocytopenia from CTX, asthma and graves disease.  He had f/u CT scan of chest/abd/pelvis March 2014 which showed no active disease.  His most recent Cr has come down to 1.3 (previous up to 1.5). Wt 289 HLA B27 (-) at previous visit.  His wt has been variable due to his thyroid rx, also had to add fluid pill. If he is up too much his RLE swells.   HIV 1 RNA Quant (copies/mL)  Date Value  10/08/2012 <20   03/29/2012 <20   09/05/2011 <20      CD4 T Cell Abs (cmm)  Date Value  10/08/2012 210*  03/29/2012 170*  09/05/2011 160*    Review of Systems  Constitutional: Negative for appetite change and unexpected weight change.  Gastrointestinal: Negative for diarrhea and constipation.  Genitourinary: Negative for difficulty urinating.       Objective:   Physical Exam  Constitutional: He appears well-developed and well-nourished.  HENT:  Mouth/Throat: No oropharyngeal exudate.  Eyes: EOM are normal. Pupils are equal, round, and reactive to light.  Neck: Neck supple.  Cardiovascular: Normal rate, regular rhythm and normal heart sounds.   Pulmonary/Chest: Effort normal and breath sounds normal.  Abdominal: Soft. Bowel sounds are normal. There is no tenderness. There is no rebound.  Lymphadenopathy:    He has no cervical adenopathy.          Assessment & Plan:

## 2012-10-22 NOTE — Assessment & Plan Note (Signed)
Greatly appreciate endo f/u and regulation of his anti-htn rx.

## 2012-10-26 ENCOUNTER — Telehealth: Payer: Self-pay | Admitting: *Deleted

## 2012-10-26 NOTE — Telephone Encounter (Signed)
Pt returned my call and I confirmed 11/01/12 appt w/ pt. We rescheduled due to Dr. Welton Flakes being sick.

## 2012-10-26 NOTE — Telephone Encounter (Signed)
Left message for pt to return my call to see if he can see Dr. Welton Flakes at 1:30 on 4/24 before his flush appt at 2pm.

## 2012-10-31 ENCOUNTER — Telehealth: Payer: Self-pay | Admitting: Oncology

## 2012-11-01 ENCOUNTER — Ambulatory Visit: Payer: No Typology Code available for payment source | Admitting: Oncology

## 2012-11-06 ENCOUNTER — Telehealth: Payer: Self-pay | Admitting: Oncology

## 2012-11-06 ENCOUNTER — Encounter: Payer: Self-pay | Admitting: Oncology

## 2012-11-06 ENCOUNTER — Ambulatory Visit: Payer: No Typology Code available for payment source

## 2012-11-06 ENCOUNTER — Ambulatory Visit (HOSPITAL_BASED_OUTPATIENT_CLINIC_OR_DEPARTMENT_OTHER): Payer: No Typology Code available for payment source | Admitting: Oncology

## 2012-11-06 VITALS — BP 143/83 | HR 83 | Temp 98.7°F | Resp 20 | Ht 73.0 in | Wt 280.4 lb

## 2012-11-06 VITALS — BP 142/80 | HR 78 | Temp 97.6°F

## 2012-11-06 DIAGNOSIS — B20 Human immunodeficiency virus [HIV] disease: Secondary | ICD-10-CM

## 2012-11-06 DIAGNOSIS — C7951 Secondary malignant neoplasm of bone: Secondary | ICD-10-CM

## 2012-11-06 DIAGNOSIS — C469 Kaposi's sarcoma, unspecified: Secondary | ICD-10-CM

## 2012-11-06 MED ORDER — HEPARIN SOD (PORK) LOCK FLUSH 100 UNIT/ML IV SOLN
500.0000 [IU] | Freq: Once | INTRAVENOUS | Status: AC
  Administered 2012-11-06: 500 [IU] via INTRAVENOUS
  Filled 2012-11-06: qty 5

## 2012-11-06 MED ORDER — SODIUM CHLORIDE 0.9 % IJ SOLN
10.0000 mL | INTRAMUSCULAR | Status: DC | PRN
  Administered 2012-11-06: 10 mL via INTRAVENOUS
  Filled 2012-11-06: qty 10

## 2012-11-06 NOTE — Progress Notes (Signed)
Only able to get back a tinge of blood.  Unable to clear port.  Patient not actively being treated so heparin locked and discharged patient.  Advised patient may need TPA in the future.  TKF

## 2012-11-06 NOTE — Patient Instructions (Addendum)
Doing well, scans look good  Weight loss very important!

## 2012-11-06 NOTE — Progress Notes (Signed)
OFFICE PROGRESS NOTE  CC Dr. 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  #2 bilateral subdural hematoma in August 2011 status post evacuation.  #3 HIV disease on highly active antiretroviral therapy or  PRIOR THERAPY: #1 metastatic Kaposi's sarcoma patient previously has been treated with chemotherapy including Taxol. He also received liposomal doxorubicin.  #2 unfortunately patient developed subdural hematoma and his chemotherapy was discontinued and he has been on observation only.  #3 patient has been on HART therapy for his HIV disease  CURRENT THERAPY:Observation for Kaposi sarcoma  INTERVAL HISTORY: Steven [name] 32 y.o. male returns for Followup visit.overall Steven [name] feels great. He is working. He occasionally does get tired and fatigued but he has not noticed any fevers chills night sweats. He has no lower extremity edema. She has no nausea or vomiting no abdominal pain he has not noticed any Kaposi's outbreaks on his skin. He has not noticed any lower extremity pain. No bleeding problems. He continues to be on his HART therapy. He has gained significant amount of weight. Remainder of the 10 point review of systems is negative.  MEDICAL HISTORY: Past Medical History  Diagnosis Date  . HIV positive 03/23/09    Genotype Y181C  . HTN (hypertension)   . Syphilis 03/23/09-    1:2  . Kaposi's sarcoma   . Cancer   . Asthma     ALLERGIES:  is allergic to lisinopril; penicillins; and aspirin.  MEDICATIONS:  Current Outpatient Prescriptions  Medication Sig Dispense Refill  . acetaminophen (TYLENOL) 500 MG tablet Take 500 mg by mouth every 6 (six) hours as needed. Pain or fever      . albuterol (PROVENTIL HFA;VENTOLIN HFA) 108 (90 BASE) MCG/ACT inhaler Inhale 2 puffs into the lungs every 4 (four) hours as needed for wheezing or shortness of breath.  1 Inhaler  0  . amLODipine  (NORVASC) 10 MG tablet Take 1 tablet (10 mg total) by mouth daily.  30 tablet  3  . emtricitabine-tenofovir (TRUVADA) 200-300 MG per tablet Take 1 tablet by mouth daily.  30 tablet  6  . fexofenadine (ALLEGRA) 180 MG tablet Take 1 tablet (180 mg total) by mouth daily.  15 tablet  0  . fluticasone-salmeterol (ADVAIR HFA) 115-21 MCG/ACT inhaler Inhale 2 puffs into the lungs 2 (two) times daily.  1 Inhaler  12  . hydrochlorothiazide (HYDRODIURIL) 25 MG tablet Take 1 tablet (25 mg total) by mouth daily.  30 tablet  3  . raltegravir (ISENTRESS) 400 MG tablet Take 1 tablet (400 mg total) by mouth 2 (two) times daily.  60 tablet  11  . sulfamethoxazole-trimethoprim (BACTRIM DS) 800-160 MG per tablet Take 1 tablet by mouth daily.  30 tablet  6  . [DISCONTINUED] carvedilol (COREG) 12.5 MG tablet Take 1 tablet (12.5 mg total) by mouth 2 (two) times daily with a meal.  30 tablet  0  . [DISCONTINUED] lisinopril-hydrochlorothiazide (PRINZIDE,ZESTORETIC) 20-12.5 MG per tablet Take 1 tablet by mouth daily.       No current facility-administered medications for this visit.   Facility-Administered Medications Ordered in Other Visits  Medication Dose Route Frequency Provider Last Rate Last Dose  . heparin lock flush 100 unit/mL  500 Units Intravenous Once Victorino December, MD      . sodium chloride 0.9 % injection 10 mL  10 mL Intravenous PRN Victorino December, MD   10 mL at 03/22/12 1243  . sodium chloride  0.9 % injection 10 mL  10 mL Intravenous PRN Victorino December, MD        SURGICAL HISTORY: No past surgical history on file.  REVIEW OF SYSTEMS:  Pertinent items are noted in HPI.   PHYSICAL EXAMINATION:  HEENT exam: EOMI PERRLA sclerae anicteric no conjunctival pallor oral mucosa is moist neck is supple there is no palpable cervical supraclavicular or axillary adenopathy no inguinal adenopathy. Lungs: Scattered wheezes bilaterally no rales or rhonchi. Heart he vascular: Regular rate rhythm no murmurs gallops or  rubs abdomen: Soft nontender nondistended bowel sounds are present no hepatosplenomegaly. Extremities: Warm with 1+ edema no clubbing. Skin: There is noted to be cutaneous nodules on the right lower extremity measuring about 2 mm to 3 mm otherwise no other findings. Neuro: Patient's alert oriented x3 DTRs are +4 motor and sensory is intact strength is symmetrical.  ECOG PERFORMANCE STATUS: 0 - Asymptomatic  Blood pressure 143/83, pulse 83, temperature 98.7 F (37.1 C), temperature source Oral, resp. rate 20, height 6\' 1"  (1.854 m), weight 280 lb 6.4 oz (127.189 kg).  LABORATORY DATA: Lab Results  Component Value Date   WBC 5.5 10/08/2012   HGB 16.5 10/08/2012   HCT 51.0 10/08/2012   MCV 81.6 10/08/2012   PLT 200 10/08/2012      Chemistry      Component Value Date/Time   NA 139 10/08/2012 1604   NA 141 09/20/2012 1202   NA 133 05/04/2010 1254   K 3.8 10/08/2012 1604   K 4.0 09/20/2012 1202   K 4.1 05/04/2010 1254   CL 99 10/08/2012 1604   CL 101 09/20/2012 1202   CL 100 05/04/2010 1254   CO2 32 10/08/2012 1604   CO2 29 09/20/2012 1202   CO2 29 05/04/2010 1254   BUN 22 10/08/2012 1604   BUN 14.1 09/20/2012 1202   BUN 12 05/04/2010 1254   CREATININE 1.30 10/08/2012 1604   CREATININE 1.4* 09/20/2012 1202   CREATININE 1.31 02/08/2012 0316      Component Value Date/Time   CALCIUM 9.6 10/08/2012 1604   CALCIUM 9.7 09/20/2012 1202   CALCIUM 8.9 05/04/2010 1254   ALKPHOS 65 10/08/2012 1604   ALKPHOS 61 09/20/2012 1202   ALKPHOS 110* 05/04/2010 1254   AST 21 10/08/2012 1604   AST 26 09/20/2012 1202   AST 23 05/04/2010 1254   ALT 27 10/08/2012 1604   ALT 26 09/20/2012 1202   BILITOT 0.9 10/08/2012 1604   BILITOT 1.60* 09/20/2012 1202   BILITOT 1.10 05/04/2010 1254       RADIOGRAPHIC STUDIES:  No results found.  ASSESSMENT: 32 year old male with  #1 HIV disease on antiretrovirals therapy.  #2 Kaposi's sarcoma he has been heavily treated in the past. Right now I do think that his Kaposi's is  silent he has no lower extremity edema which is a presentation.at this time he is overall stable he doesn't have any progressive disease.Recent scans no evidence of progressive disease  PLAN:  #1 patient will continue to be followed every 6 months.  #2 he will also have port flushes every 2 months.  3. We discussed exercise and diet.  All questions were answered. The patient knows to call the clinic with any problems, questions or concerns. We can certainly see the patient much sooner if necessary.  I spent  25 minutes counseling the patient face to face. The total time spent in the appointment was 30 minutes.    Drue Second, MD Medical/Oncology [facility] [phone] (beeper) [phone] (Office)  11/06/2012, 9:28 AM

## 2012-11-06 NOTE — Patient Instructions (Signed)
Call MD for problems 

## 2012-11-13 ENCOUNTER — Telehealth (HOSPITAL_COMMUNITY): Payer: Self-pay

## 2012-11-13 NOTE — ED Notes (Signed)
Spoke with ben at [facility] pharmacy Refilled norvasc and hydrochlorothiazide Patient was recently seen by Dr Susie Cassette

## 2012-11-21 ENCOUNTER — Ambulatory Visit (INDEPENDENT_AMBULATORY_CARE_PROVIDER_SITE_OTHER): Payer: No Typology Code available for payment source | Admitting: *Deleted

## 2012-11-21 DIAGNOSIS — Z23 Encounter for immunization: Secondary | ICD-10-CM

## 2012-11-26 ENCOUNTER — Telehealth: Payer: Self-pay

## 2012-11-26 ENCOUNTER — Ambulatory Visit: Payer: No Typology Code available for payment source

## 2012-11-26 NOTE — Telephone Encounter (Signed)
Patient has Medicare Part A & B which started 11/08/12 - he is no longer eligible for [employer] or [facility] - he is eligible for SPAP though - and signed up for Part D today.

## 2012-12-25 ENCOUNTER — Ambulatory Visit: Payer: Medicare Other | Attending: Family Medicine | Admitting: Internal Medicine

## 2012-12-25 VITALS — BP 140/85 | HR 91 | Temp 98.9°F | Resp 16 | Ht 73.03 in | Wt 278.0 lb

## 2012-12-25 DIAGNOSIS — I10 Essential (primary) hypertension: Secondary | ICD-10-CM | POA: Insufficient documentation

## 2012-12-25 DIAGNOSIS — B20 Human immunodeficiency virus [HIV] disease: Secondary | ICD-10-CM

## 2012-12-25 DIAGNOSIS — E039 Hypothyroidism, unspecified: Secondary | ICD-10-CM | POA: Diagnosis not present

## 2012-12-25 DIAGNOSIS — C469 Kaposi's sarcoma, unspecified: Secondary | ICD-10-CM | POA: Diagnosis not present

## 2012-12-25 MED ORDER — HYDROCHLOROTHIAZIDE 25 MG PO TABS: 25.0000 mg | ORAL_TABLET | Freq: Every day | ORAL | Status: DC

## 2012-12-25 MED ORDER — AMLODIPINE BESYLATE 10 MG PO TABS: 10.0000 mg | ORAL_TABLET | Freq: Every day | ORAL | Status: DC

## 2012-12-25 NOTE — Progress Notes (Signed)
Patient ID: Steven [name], male   DOB: 10-Jul-1981, 31 y.o.   MRN: [id number] Patient Demographics  Steven [name], is a 32 y.o. male  [id number]  [id number]  DOB - 06/30/1981  Chief Complaint  Patient presents with  . Follow-up        Subjective:   Steven [name] today is here for a follow up visit. He has no major complaints, he has a history of HIV, history of Kaposi's sarcoma, history of hypothyroidism and hypertension. He has almost run out of his antihypertensive medications and is requesting a refill.  Patient has No headache, No chest pain, No abdominal pain - No Nausea, No new weakness tingling or numbness, No Cough - SOB.   Objective:    Filed Vitals:   12/25/12 1026  BP: 140/85  Pulse: 91  Temp: 98.9 F (37.2 C)  TempSrc: Oral  Resp: 16  Height: 6' 1.03" (1.855 m)  Weight: 278 lb (126.1 kg)  SpO2: 98%     ALLERGIES:   Allergies  Allergen Reactions  . Lisinopril Swelling    Swelling of lower lip while on lisinopril  . Penicillins Hives and Swelling    REACTION: rash and swelling  . Aspirin Nausea Only    PAST MEDICAL HISTORY: Past Medical History  Diagnosis Date  . HIV positive 03/23/09    Genotype Y181C  . HTN (hypertension)   . Syphilis 03/23/09-    1:2  . Kaposi's sarcoma   . Cancer   . Asthma     MEDICATIONS AT HOME: Prior to Admission medications   Medication Sig Start Date End Date Taking? Authorizing Provider  acetaminophen (TYLENOL) 500 MG tablet Take 500 mg by mouth every 6 (six) hours as needed. Pain or fever   Yes Historical Provider, MD  albuterol (PROVENTIL HFA;VENTOLIN HFA) 108 (90 BASE) MCG/ACT inhaler Inhale 2 puffs into the lungs every 4 (four) hours as needed for wheezing or shortness of breath. 02/08/12 02/07/13 Yes Olivia Mackie, MD  amLODipine (NORVASC) 10 MG tablet Take 1 tablet (10 mg total) by mouth daily. 12/25/12  Yes Yarelin Reichardt Levora Dredge, MD  emtricitabine-tenofovir (TRUVADA) 200-300 MG per tablet Take 1 tablet by  mouth daily. 05/07/12  Yes Ginnie Smart, MD  fexofenadine (ALLEGRA) 180 MG tablet Take 1 tablet (180 mg total) by mouth daily. 05/03/12  Yes Reuben Likes, MD  fluticasone-salmeterol (ADVAIR HFA) [phone] MCG/ACT inhaler Inhale 2 puffs into the lungs 2 (two) times daily. 09/10/12  Yes Richarda Overlie, MD  hydrochlorothiazide (HYDRODIURIL) 25 MG tablet Take 1 tablet (25 mg total) by mouth daily. 12/25/12  Yes Lockie Bothun Levora Dredge, MD  raltegravir (ISENTRESS) 400 MG tablet Take 1 tablet (400 mg total) by mouth 2 (two) times daily. 05/27/11  Yes Ginnie Smart, MD  sulfamethoxazole-trimethoprim (BACTRIM DS) 800-160 MG per tablet Take 1 tablet by mouth daily. 05/07/12  Yes Ginnie Smart, MD     Exam  General appearance :Awake, alert, not in any distress. Speech Clear. Not toxic Looking HEENT: Atraumatic and Normocephalic, pupils equally reactive to light and accomodation Neck: supple, no JVD. No cervical lymphadenopathy.  Chest:Good air entry bilaterally, no added sounds  CVS: S1 S2 regular, no murmurs.  Abdomen: Bowel sounds present, Non tender and not distended with no gaurding, rigidity or rebound. Extremities: B/L Lower Ext shows no edema, both legs are warm to touch Neurology: Awake alert, and oriented X 3, CN II-XII intact, Non focal Skin:No Rash Wounds:N/A    Data Review   CBC No  results found for this basename: WBC, HGB, HCT, PLT, MCV, MCH, MCHC, RDW, NEUTRABS, LYMPHSABS, MONOABS, EOSABS, BASOSABS, BANDABS, BANDSABD,  in the last 168 hours  Chemistries   No results found for this basename: NA, K, CL, CO2, GLUCOSE, BUN, CREATININE, GFRCGP, CALCIUM, MG, AST, ALT, ALKPHOS, BILITOT,  in the last 168 hours ------------------------------------------------------------------------------------------------------------------ No results found for this basename: HGBA1C,  in the last 72  hours ------------------------------------------------------------------------------------------------------------------ No results found for this basename: CHOL, HDL, LDLCALC, TRIG, CHOLHDL, LDLDIRECT,  in the last 72 hours ------------------------------------------------------------------------------------------------------------------ No results found for this basename: TSH, T4TOTAL, FREET3, T3FREE, THYROIDAB,  in the last 72 hours ------------------------------------------------------------------------------------------------------------------ No results found for this basename: VITAMINB12, FOLATE, FERRITIN, TIBC, IRON, RETICCTPCT,  in the last 72 hours  Coagulation profile  No results found for this basename: INR, PROTIME,  in the last 168 hours    Assessment & Plan   Hypertension - Continue with amlodipine and HCTZ, blood pressure reasonably well controlled  HIV - On antiretrovirals, he follows with Dr. Ninetta Lights at the infectious disease clinic. We will defer all of his HIV issues to Dr. Ninetta Lights  History of Kaposi's sarcoma - He sees Dr. Welton Flakes at the cancer center, we will defer this issue to Dr. 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 Leslie Dales  Followup in 3 months

## 2012-12-25 NOTE — Progress Notes (Signed)
Pt is here for a gen f/u on HTN and needing refill on his BP meds Voices no concerns at the moment... He is alert and oriented w/no signs of acute distress.

## 2012-12-27 ENCOUNTER — Ambulatory Visit (HOSPITAL_BASED_OUTPATIENT_CLINIC_OR_DEPARTMENT_OTHER): Payer: Medicare Other

## 2012-12-27 VITALS — BP 144/85 | HR 94 | Temp 98.5°F | Resp 18

## 2012-12-27 DIAGNOSIS — Z452 Encounter for adjustment and management of vascular access device: Secondary | ICD-10-CM

## 2012-12-27 DIAGNOSIS — C469 Kaposi's sarcoma, unspecified: Secondary | ICD-10-CM | POA: Diagnosis not present

## 2012-12-27 MED ORDER — HEPARIN SOD (PORK) LOCK FLUSH 100 UNIT/ML IV SOLN
500.0000 [IU] | Freq: Once | INTRAVENOUS | Status: AC
  Administered 2012-12-27: 500 [IU] via INTRAVENOUS
  Filled 2012-12-27: qty 5

## 2012-12-27 MED ORDER — SODIUM CHLORIDE 0.9 % IJ SOLN
10.0000 mL | INTRAMUSCULAR | Status: DC | PRN
  Administered 2012-12-27: 10 mL via INTRAVENOUS
  Filled 2012-12-27: qty 10

## 2012-12-27 NOTE — Patient Instructions (Signed)
Call MD for problems or concerns 

## 2013-01-03 ENCOUNTER — Other Ambulatory Visit: Payer: Self-pay | Admitting: *Deleted

## 2013-01-03 DIAGNOSIS — B20 Human immunodeficiency virus [HIV] disease: Secondary | ICD-10-CM

## 2013-01-03 MED ORDER — EMTRICITABINE-TENOFOVIR DF 200-300 MG PO TABS: 1.0000 | ORAL_TABLET | Freq: Every day | ORAL | Status: DC

## 2013-01-03 MED ORDER — SULFAMETHOXAZOLE-TMP DS 800-160 MG PO TABS: 1.0000 | ORAL_TABLET | Freq: Every day | ORAL | Status: DC

## 2013-01-03 MED ORDER — RALTEGRAVIR POTASSIUM 400 MG PO TABS: 400.0000 mg | ORAL_TABLET | Freq: Two times a day (BID) | ORAL | Status: DC

## 2013-01-14 ENCOUNTER — Other Ambulatory Visit: Payer: Self-pay | Admitting: Licensed Clinical Social Worker

## 2013-01-14 DIAGNOSIS — B20 Human immunodeficiency virus [HIV] disease: Secondary | ICD-10-CM

## 2013-01-14 MED ORDER — RALTEGRAVIR POTASSIUM 400 MG PO TABS: 400.0000 mg | ORAL_TABLET | Freq: Two times a day (BID) | ORAL | Status: DC

## 2013-01-14 MED ORDER — EMTRICITABINE-TENOFOVIR DF 200-300 MG PO TABS: 1.0000 | ORAL_TABLET | Freq: Every day | ORAL | Status: DC

## 2013-02-21 ENCOUNTER — Ambulatory Visit (HOSPITAL_BASED_OUTPATIENT_CLINIC_OR_DEPARTMENT_OTHER): Payer: Medicare Other

## 2013-02-21 VITALS — BP 127/70 | HR 92 | Temp 98.6°F

## 2013-02-21 DIAGNOSIS — C469 Kaposi's sarcoma, unspecified: Secondary | ICD-10-CM | POA: Diagnosis not present

## 2013-02-21 DIAGNOSIS — Z452 Encounter for adjustment and management of vascular access device: Secondary | ICD-10-CM | POA: Diagnosis not present

## 2013-02-21 MED ORDER — HEPARIN SOD (PORK) LOCK FLUSH 100 UNIT/ML IV SOLN
500.0000 [IU] | Freq: Once | INTRAVENOUS | Status: AC
  Administered 2013-02-21: 500 [IU] via INTRAVENOUS
  Filled 2013-02-21: qty 5

## 2013-02-21 MED ORDER — SODIUM CHLORIDE 0.9 % IJ SOLN
10.0000 mL | INTRAMUSCULAR | Status: DC | PRN
  Administered 2013-02-21: 10 mL via INTRAVENOUS
  Filled 2013-02-21: qty 10

## 2013-02-21 NOTE — Patient Instructions (Addendum)
Implanted Port Instructions  An implanted port is a central line that has a round shape and is placed under the skin. It is used for long-term IV (intravenous) access for:  · Medicine.  · Fluids.  · Liquid nutrition, such as TPN (total parenteral nutrition).  · Blood samples.  Ports can be placed:  · In the chest area just below the collarbone (this is the most common place.)  · In the arms.  · In the belly (abdomen) area.  · In the legs.  PARTS OF THE PORT  A port has 2 main parts:  · The reservoir. The reservoir is round, disc-shaped, and will be a small, raised area under your skin.  · The reservoir is the part where a needle is inserted (accessed) to either give medicines or to draw blood.  · The catheter. The catheter is a long, slender tube that extends from the reservoir. The catheter is placed into a large vein.  · Medicine that is inserted into the reservoir goes into the catheter and then into the vein.  INSERTION OF THE PORT  · The port is surgically placed in either an operating room or in a procedural area (interventional radiology).  · Medicine may be given to help you relax during the procedure.  · The skin where the port will be inserted is numbed (local anesthetic).  · 1 or 2 small cuts (incisions) will be made in the skin to insert the port.  · The port can be used after it has been inserted.  INCISION SITE CARE  · The incision site may have small adhesive strips on it. This helps keep the incision site closed. Sometimes, no adhesive strips are placed. Instead of adhesive strips, a special kind of surgical glue is used to keep the incision closed.  · If adhesive strips were placed on the incision sites, do not take them off. They will fall off on their own.  · The incision site may be sore for 1 to 2 days. Pain medicine can help.  · Do not get the incision site wet. Bathe or shower as directed by your caregiver.  · The incision site should heal in 5 to 7 days. A small scar may form after the  incision has healed.  ACCESSING THE PORT  Special steps must be taken to access the port:  · Before the port is accessed, a numbing cream can be placed on the skin. This helps numb the skin over the port site.  · A sterile technique is used to access the port.  · The port is accessed with a needle. Only "non-coring" port needles should be used to access the port. Once the port is accessed, a blood return should be checked. This helps ensure the port is in the vein and is not clogged (clotted).  · If your caregiver believes your port should remain accessed, a clear (transparent) bandage will be placed over the needle site. The bandage and needle will need to be changed every week or as directed by your caregiver.  · Keep the bandage covering the needle clean and dry. Do not get it wet. Follow your caregiver's instructions on how to take a shower or bath when the port is accessed.  · If your port does not need to stay accessed, no bandage is needed over the port.  FLUSHING THE PORT  Flushing the port keeps it from getting clogged. How often the port is flushed depends on:  · If a   constant infusion is running. If a constant infusion is running, the port may not need to be flushed.  · If intermittent medicines are given.  · If the port is not being used.  For intermittent medicines:  · The port will need to be flushed:  · After medicines have been given.  · After blood has been drawn.  · As part of routine maintenance.  · A port is normally flushed with:  · Normal saline.  · Heparin.  · Follow your caregiver's advice on how often, how much, and the type of flush to use on your port.  IMPORTANT PORT INFORMATION  · Tell your caregiver if you are allergic to heparin.  · After your port is placed, you will get a manufacturer's information card. The card has information about your port. Keep this card with you at all times.  · There are many types of ports available. Know what kind of port you have.  · In case of an  emergency, it may be helpful to wear a medical alert bracelet. This can help alert health care workers that you have a port.  · The port can stay in for as long as your caregiver believes it is necessary.  · When it is time for the port to come out, surgery will be done to remove it. The surgery will be similar to how the port was put in.  · If you are in the hospital or clinic:  · Your port will be taken care of and flushed by a nurse.  · If you are at home:  · A home health care nurse may give medicines and take care of the port.  · You or a family member can get special training and directions for giving medicine and taking care of the port at home.  SEEK IMMEDIATE MEDICAL CARE IF:   · Your port does not flush or you are unable to get a blood return.  · New drainage or pus is coming from the incision.  · A bad smell is coming from the incision site.  · You develop swelling or increased redness at the incision site.  · You develop increased swelling or pain at the port site.  · You develop swelling or pain in the surrounding skin near the port.  · You have an oral temperature above 102° F (38.9° C), not controlled by medicine.  MAKE SURE YOU:   · Understand these instructions.  · Will watch your condition.  · Will get help right away if you are not doing well or get worse.  Document Released: 06/27/2005 Document Revised: 09/19/2011 Document Reviewed: 09/18/2008  Exit[employer]® Patient Information ©2014 [employer].

## 2013-03-14 ENCOUNTER — Other Ambulatory Visit: Payer: Self-pay | Admitting: Lab

## 2013-03-21 ENCOUNTER — Ambulatory Visit: Payer: Self-pay | Admitting: Oncology

## 2013-03-25 ENCOUNTER — Telehealth: Payer: Self-pay | Admitting: Family Medicine

## 2013-03-25 MED ORDER — AMLODIPINE BESYLATE 10 MG PO TABS: 10.0000 mg | ORAL_TABLET | Freq: Every day | ORAL | Status: DC

## 2013-03-25 NOTE — Telephone Encounter (Signed)
MEDICATION AMLODIPINE REORDERED. PT HAS APPT 04/01/13

## 2013-03-25 NOTE — Telephone Encounter (Signed)
Patient has ran out of medication ( amlodipine 10mg , hydrochlorothiazide 25mg ), he is scheduled for an appointment on 9/22 but needs medication before that appointment. Please call patient

## 2013-03-26 ENCOUNTER — Other Ambulatory Visit: Payer: Self-pay | Admitting: Emergency Medicine

## 2013-03-26 MED ORDER — HYDROCHLOROTHIAZIDE 25 MG PO TABS: 25.0000 mg | ORAL_TABLET | Freq: Every day | ORAL | Status: DC

## 2013-03-27 DIAGNOSIS — E890 Postprocedural hypothyroidism: Secondary | ICD-10-CM | POA: Diagnosis not present

## 2013-03-29 DIAGNOSIS — E0500 Thyrotoxicosis with diffuse goiter without thyrotoxic crisis or storm: Secondary | ICD-10-CM | POA: Diagnosis not present

## 2013-03-29 DIAGNOSIS — E890 Postprocedural hypothyroidism: Secondary | ICD-10-CM | POA: Diagnosis not present

## 2013-04-01 ENCOUNTER — Encounter: Payer: Self-pay | Admitting: Internal Medicine

## 2013-04-01 ENCOUNTER — Ambulatory Visit: Payer: Medicare Other | Attending: Internal Medicine | Admitting: Internal Medicine

## 2013-04-01 VITALS — BP 128/82 | HR 80 | Resp 16

## 2013-04-01 DIAGNOSIS — Z09 Encounter for follow-up examination after completed treatment for conditions other than malignant neoplasm: Secondary | ICD-10-CM | POA: Insufficient documentation

## 2013-04-01 DIAGNOSIS — B20 Human immunodeficiency virus [HIV] disease: Secondary | ICD-10-CM | POA: Diagnosis not present

## 2013-04-01 DIAGNOSIS — E0590 Thyrotoxicosis, unspecified without thyrotoxic crisis or storm: Secondary | ICD-10-CM | POA: Diagnosis not present

## 2013-04-01 DIAGNOSIS — N289 Disorder of kidney and ureter, unspecified: Secondary | ICD-10-CM | POA: Insufficient documentation

## 2013-04-01 DIAGNOSIS — A530 Latent syphilis, unspecified as early or late: Secondary | ICD-10-CM | POA: Diagnosis not present

## 2013-04-01 DIAGNOSIS — I10 Essential (primary) hypertension: Secondary | ICD-10-CM

## 2013-04-01 MED ORDER — AMLODIPINE BESYLATE 10 MG PO TABS: 10.0000 mg | ORAL_TABLET | Freq: Every day | ORAL | Status: DC

## 2013-04-01 MED ORDER — HYDROCHLOROTHIAZIDE 25 MG PO TABS: 25.0000 mg | ORAL_TABLET | Freq: Every day | ORAL | Status: DC

## 2013-04-01 NOTE — Progress Notes (Signed)
Patient ID: Steven [name], male   DOB: [birth year], 32 y.o.   MRN: [id number] Patient Demographics  Steven [name], is a 32 y.o. male  [id number]  [id number]  DOB - 07/30/1980  Chief Complaint  Patient presents with  . Follow-up        Subjective:   Steven [name] is a 32 y.o. male here today for a follow up visit. He has history of HIV on medication, last viral load was less than 20 copies, compliant and doing well with medications. No new complaints today. He has appointment with infectious disease and the endocrinologist where he usually gets his blood draw for routine labs tests. Patient has No headache, No chest pain, No abdominal pain - No Nausea, No new weakness tingling or numbness, No Cough - SOB.  ALLERGIES:   Allergies  Allergen Reactions  . Lisinopril Swelling    Swelling of lower lip while on lisinopril  . Penicillins Hives and Swelling    REACTION: rash and swelling  . Aspirin Nausea Only    PAST MEDICAL HISTORY: Past Medical History  Diagnosis Date  . HIV positive 03/23/09    Genotype Y181C  . HTN (hypertension)   . Syphilis 03/23/09-    1:2  . Kaposi's sarcoma   . Cancer   . Asthma     MEDICATIONS AT HOME: Prior to Admission medications   Medication Sig Start Date End Date Taking? Authorizing Provider  acetaminophen (TYLENOL) 500 MG tablet Take 500 mg by mouth every 6 (six) hours as needed. Pain or fever    Historical Provider, MD  albuterol (PROVENTIL HFA;VENTOLIN HFA) 108 (90 BASE) MCG/ACT inhaler Inhale 2 puffs into the lungs every 4 (four) hours as needed for wheezing or shortness of breath. 02/08/12 02/07/13  Olivia Mackie, MD  amLODipine (NORVASC) 10 MG tablet Take 1 tablet (10 mg total) by mouth daily. 04/01/13   Jeanann Lewandowsky, MD  emtricitabine-tenofovir (TRUVADA) 200-300 MG per tablet Take 1 tablet by mouth daily. 01/14/13   Ginnie Smart, MD  fexofenadine (ALLEGRA) 180 MG tablet Take 1 tablet (180 mg total) by mouth daily. 05/03/12    Reuben Likes, MD  fluticasone-salmeterol (ADVAIR HFA) [id number] MCG/ACT inhaler Inhale 2 puffs into the lungs 2 (two) times daily. 09/10/12   Richarda Overlie, MD  hydrochlorothiazide (HYDRODIURIL) 25 MG tablet Take 1 tablet (25 mg total) by mouth daily. 04/01/13   Jeanann Lewandowsky, MD  raltegravir (ISENTRESS) 400 MG tablet Take 1 tablet (400 mg total) by mouth 2 (two) times daily. 01/14/13   Ginnie Smart, MD  sulfamethoxazole-trimethoprim (BACTRIM DS) 800-160 MG per tablet Take 1 tablet by mouth daily. 01/03/13   Ginnie Smart, MD     Objective:   Filed Vitals:   04/01/13 1715  BP: 128/82  Pulse: 80  Resp: 16  SpO2: 100%    Exam General appearance :Awake, alert, not in any distress. Speech Clear. Not toxic Looking HEENT: Atraumatic and Normocephalic, pupils equally reactive to light and accomodation Neck: supple, no JVD. No cervical lymphadenopathy.  Chest:Good air entry bilaterally, no added sounds  CVS: S1 S2 regular, no murmurs.  Abdomen: Bowel sounds present, Non tender and not distended with no gaurding, rigidity or rebound. Extremities: B/L Lower Ext shows no edema, both legs are warm to touch Neurology: Awake alert, and oriented X 3, CN II-XII intact, Non focal Skin:No Rash Wounds:N/A   Data Review   CBC No results found for this basename: WBC, HGB, HCT, PLT, MCV, MCH, MCHC, RDW, NEUTRABS,  LYMPHSABS, MONOABS, EOSABS, BASOSABS, BANDABS, BANDSABD,  in the last 168 hours  Chemistries   No results found for this basename: NA, K, CL, CO2, GLUCOSE, BUN, CREATININE, GFRCGP, CALCIUM, MG, AST, ALT, ALKPHOS, BILITOT,  in the last 168 hours ------------------------------------------------------------------------------------------------------------------ No results found for this basename: HGBA1C,  in the last 72 hours ------------------------------------------------------------------------------------------------------------------ No results found for this basename: CHOL, HDL,  LDLCALC, TRIG, CHOLHDL, LDLDIRECT,  in the last 72 hours ------------------------------------------------------------------------------------------------------------------ No results found for this basename: TSH, T4TOTAL, FREET3, T3FREE, THYROIDAB,  in the last 72 hours ------------------------------------------------------------------------------------------------------------------ No results found for this basename: VITAMINB12, FOLATE, FERRITIN, TIBC, IRON, RETICCTPCT,  in the last 72 hours  Coagulation profile  No results found for this basename: INR, PROTIME,  in the last 168 hours    Assessment & Plan   Patient Active Problem List   Diagnosis Date Noted  . Renal dysfunction 04/12/2012  . Colitis 05/27/2011  . KS (Kaposi's sarcoma) 04/11/2011  . Hyperthyroidism 04/05/2011  . 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 AIDS 04/10/2009  . CANDIDIASIS, ORAL 04/10/2009  . HYPERTENSION 04/10/2009  . LYMPHEDEMA 04/10/2009  . SYPHILIS 04/06/2009     Plan: Refill the following medications: Hydrochlorothiazide 25 mg tablet by mouth daily Norvasc 10 mg tablet by mouth daily Patient will continue other medications as prescribed by other specialists Patient has been counseled about nutrition and exercise  Follow up in 3 months   The patient was given clear instructions to go to ER or return to medical center if symptoms don't improve, worsen or new problems develop. The patient verbalized understanding. The patient was told to call to get lab results if they haven't heard anything in the next week.    Jeanann Lewandowsky, MD, MHA, FACP [facility] [place] [phone]   04/01/2013, 5:23 PM

## 2013-04-01 NOTE — Progress Notes (Signed)
Patient here for follow up on his HTN And scripts for [facility]

## 2013-04-09 ENCOUNTER — Ambulatory Visit (INDEPENDENT_AMBULATORY_CARE_PROVIDER_SITE_OTHER): Payer: Medicare Other | Admitting: *Deleted

## 2013-04-09 ENCOUNTER — Other Ambulatory Visit (INDEPENDENT_AMBULATORY_CARE_PROVIDER_SITE_OTHER): Payer: Medicare Other

## 2013-04-09 DIAGNOSIS — B20 Human immunodeficiency virus [HIV] disease: Secondary | ICD-10-CM | POA: Diagnosis not present

## 2013-04-09 DIAGNOSIS — Z23 Encounter for immunization: Secondary | ICD-10-CM

## 2013-04-09 LAB — COMPLETE METABOLIC PANEL WITH GFR
ALT: 24 U/L (ref 0–53)
BUN: 17 mg/dL (ref 6–23)
CO2: 28 mEq/L (ref 19–32)
Calcium: 9.8 mg/dL (ref 8.4–10.5)
Chloride: 100 mEq/L (ref 96–112)
Creat: 1.67 mg/dL — ABNORMAL HIGH (ref 0.50–1.35)
GFR, Est African American: 61 mL/min
GFR, Est Non African American: 53 mL/min — ABNORMAL LOW
Glucose, Bld: 90 mg/dL (ref 70–99)
Total Bilirubin: 1.1 mg/dL (ref 0.3–1.2)

## 2013-04-09 LAB — CBC
MCH: 26.1 pg (ref 26.0–34.0)
MCHC: 34 g/dL (ref 30.0–36.0)
MCV: 76.9 fL — ABNORMAL LOW (ref 78.0–100.0)
Platelets: 192 10*3/uL (ref 150–400)
RBC: 6.35 MIL/uL — ABNORMAL HIGH (ref 4.22–5.81)
RDW: 15.5 % (ref 11.5–15.5)

## 2013-04-10 LAB — T-HELPER CELL (CD4) - (RCID CLINIC ONLY)
CD4 % Helper T Cell: 13 % — ABNORMAL LOW (ref 33–55)
CD4 T Cell Abs: 200 /uL — ABNORMAL LOW (ref 400–2700)

## 2013-04-11 LAB — HIV-1 RNA QUANT-NO REFLEX-BLD: HIV 1 RNA Quant: <20 copies/mL (ref <20)

## 2013-04-23 ENCOUNTER — Encounter: Payer: Self-pay | Admitting: Infectious Diseases

## 2013-04-23 ENCOUNTER — Ambulatory Visit (INDEPENDENT_AMBULATORY_CARE_PROVIDER_SITE_OTHER): Payer: Medicare Other | Admitting: Infectious Diseases

## 2013-04-23 VITALS — BP 126/73 | HR 77 | Temp 98.5°F | Ht 73.0 in | Wt 269.0 lb

## 2013-04-23 DIAGNOSIS — B20 Human immunodeficiency virus [HIV] disease: Secondary | ICD-10-CM

## 2013-04-23 DIAGNOSIS — C469 Kaposi's sarcoma, unspecified: Secondary | ICD-10-CM

## 2013-04-23 DIAGNOSIS — N289 Disorder of kidney and ureter, unspecified: Secondary | ICD-10-CM

## 2013-04-23 DIAGNOSIS — Z79899 Other long term (current) drug therapy: Secondary | ICD-10-CM

## 2013-04-23 DIAGNOSIS — Z23 Encounter for immunization: Secondary | ICD-10-CM | POA: Diagnosis not present

## 2013-04-23 DIAGNOSIS — Z113 Encounter for screening for infections with a predominantly sexual mode of transmission: Secondary | ICD-10-CM | POA: Diagnosis not present

## 2013-04-23 MED ORDER — ABACAVIR-DOLUTEGRAVIR-LAMIVUD 600-50-300 MG PO TABS: 1.0000 | ORAL_TABLET | Freq: Every day | ORAL | Status: DC

## 2013-04-23 NOTE — Assessment & Plan Note (Signed)
Has been doing well, has onc f/u later this month. Greatly appreciate Dr Santo Held f/u

## 2013-04-23 NOTE — Assessment & Plan Note (Addendum)
Cr up slightly from previous. Hopefully getting off TRV will be helpful. Will stop bactrim, his CD4 is high enough.

## 2013-04-23 NOTE — Progress Notes (Signed)
  Subjective:    Patient ID: Markeis Allman, male    DOB: 11-Dec-1980, 32 y.o.   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 CRI.  He had f/u CT scan of chest/abd/pelvis March 2014 which showed no active disease. Has f/u with Dr Park Breed end of October 2014.  Feeling well- has lost some wt. Has been eating better. Watching what he is eating (was borderline DM previously).  On ISN/TRV. Has no trouble taking meds bid.   HIV 1 RNA Quant (copies/mL)  Date Value  04/09/2013 <20   10/08/2012 <20   03/29/2012 <20      CD4 T Cell Abs (/uL)  Date Value  04/09/2013 200*  10/08/2012 210*  03/29/2012 170*     Review of Systems  Constitutional: Negative for appetite change and unexpected weight change.  Gastrointestinal: Negative for diarrhea and constipation.  Genitourinary: Negative for difficulty urinating.       Objective:   Physical Exam        Assessment & Plan:

## 2013-04-23 NOTE — Addendum Note (Signed)
Addended by: Andree Coss on: 04/23/2013 05:22 PM   Modules accepted: Orders

## 2013-04-23 NOTE — Assessment & Plan Note (Signed)
He is doing very well. Has gotten flu. Offered/refuses condoms. Will change him to Tiumeq- will be easier on kidneys, and will be qday. Will see him back in 2-3 months to assess tolerance, adherence, labs.

## 2013-04-25 ENCOUNTER — Other Ambulatory Visit: Payer: Self-pay | Admitting: Licensed Clinical Social Worker

## 2013-04-25 ENCOUNTER — Telehealth: Payer: Self-pay | Admitting: Licensed Clinical Social Worker

## 2013-04-25 NOTE — Telephone Encounter (Signed)
I received a prescription prior authorization for Triumeq, I called [phone] to [employer] his medication plan and the drug was not in there system. Monia Pouch states it will take up to 48 hours to add it to there formulary and hopefully approve. I called the patient to let him know and also ask him to continue his current regimen of isentress and truvada.

## 2013-05-01 ENCOUNTER — Telehealth: Payer: Self-pay | Admitting: *Deleted

## 2013-05-01 NOTE — Telephone Encounter (Signed)
Let pt know that Monia Pouch usually takes 3 business days to review appeal.  Will probably have a decision at the beginning of next week.  Pt verbalized understanding.

## 2013-05-01 NOTE — Telephone Encounter (Signed)
Appeal of denied Prior Authorization needed for Triumeq.   Verbal appeal information provided to [employer].  Monia Pouch takes 3 business days to process.   Will notify office .

## 2013-05-02 ENCOUNTER — Telehealth: Payer: Self-pay | Admitting: *Deleted

## 2013-05-02 NOTE — Telephone Encounter (Signed)
Faxed back requested information and completed form.  Form placed in Dr. Moshe Cipro box for initialing.

## 2013-05-03 ENCOUNTER — Other Ambulatory Visit: Payer: Self-pay | Admitting: Licensed Clinical Social Worker

## 2013-05-03 DIAGNOSIS — B20 Human immunodeficiency virus [HIV] disease: Secondary | ICD-10-CM

## 2013-05-03 MED ORDER — ABACAVIR-DOLUTEGRAVIR-LAMIVUD 600-50-300 MG PO TABS: 1.0000 | ORAL_TABLET | Freq: Every day | ORAL | Status: DC

## 2013-05-09 ENCOUNTER — Telehealth: Payer: Self-pay | Admitting: Licensed Clinical Social Worker

## 2013-05-09 ENCOUNTER — Telehealth: Payer: Self-pay | Admitting: *Deleted

## 2013-05-09 ENCOUNTER — Ambulatory Visit (HOSPITAL_BASED_OUTPATIENT_CLINIC_OR_DEPARTMENT_OTHER): Payer: Medicare Other | Admitting: Oncology

## 2013-05-09 ENCOUNTER — Other Ambulatory Visit (HOSPITAL_BASED_OUTPATIENT_CLINIC_OR_DEPARTMENT_OTHER): Payer: Medicare Other | Admitting: Lab

## 2013-05-09 ENCOUNTER — Ambulatory Visit: Payer: Medicare Other

## 2013-05-09 ENCOUNTER — Encounter: Payer: Self-pay | Admitting: Oncology

## 2013-05-09 ENCOUNTER — Telehealth: Payer: Self-pay | Admitting: Oncology

## 2013-05-09 VITALS — BP 127/85 | HR 74 | Temp 98.7°F | Resp 18 | Ht 73.0 in | Wt 268.2 lb

## 2013-05-09 DIAGNOSIS — B20 Human immunodeficiency virus [HIV] disease: Secondary | ICD-10-CM | POA: Diagnosis not present

## 2013-05-09 DIAGNOSIS — C7951 Secondary malignant neoplasm of bone: Secondary | ICD-10-CM

## 2013-05-09 DIAGNOSIS — C469 Kaposi's sarcoma, unspecified: Secondary | ICD-10-CM | POA: Diagnosis not present

## 2013-05-09 DIAGNOSIS — R591 Generalized enlarged lymph nodes: Secondary | ICD-10-CM

## 2013-05-09 LAB — COMPREHENSIVE METABOLIC PANEL (CC13)
ALT: 27 U/L (ref 0–55)
AST: 25 U/L (ref 5–34)
Anion Gap: 10 mEq/L (ref 3–11)
BUN: 10.8 mg/dL (ref 7.0–26.0)
CO2: 26 mEq/L (ref 22–29)
Chloride: 104 mEq/L (ref 98–109)
Creatinine: 1.4 mg/dL — ABNORMAL HIGH (ref 0.7–1.3)
Glucose: 98 mg/dl (ref 70–140)
Total Bilirubin: 1.47 mg/dL — ABNORMAL HIGH (ref 0.20–1.20)

## 2013-05-09 LAB — CBC WITH DIFFERENTIAL/PLATELET
BASO%: 0.8 % (ref 0.0–2.0)
Eosinophils Absolute: 0.3 10*3/uL (ref 0.0–0.5)
HCT: 51.6 % — ABNORMAL HIGH (ref 38.4–49.9)
HGB: 16.3 g/dL (ref 13.0–17.1)
LYMPH%: 33.7 % (ref 14.0–49.0)
MCHC: 31.6 g/dL — ABNORMAL LOW (ref 32.0–36.0)
MCV: 80.1 fL (ref 79.3–98.0)
MONO#: 0.3 10*3/uL (ref 0.1–0.9)
MONO%: 6.7 % (ref 0.0–14.0)
NEUT#: 2.3 10*3/uL (ref 1.5–6.5)
NEUT%: 52.9 % (ref 39.0–75.0)
Platelets: 154 10*3/uL (ref 140–400)
WBC: 4.4 10*3/uL (ref 4.0–10.3)
lymph#: 1.5 10*3/uL (ref 0.9–3.3)

## 2013-05-09 MED ORDER — SODIUM CHLORIDE 0.9 % IJ SOLN
10.0000 mL | INTRAMUSCULAR | Status: DC | PRN
  Filled 2013-05-09: qty 10

## 2013-05-09 MED ORDER — HEPARIN SOD (PORK) LOCK FLUSH 100 UNIT/ML IV SOLN
500.0000 [IU] | Freq: Once | INTRAVENOUS | Status: DC
  Filled 2013-05-09: qty 5

## 2013-05-09 NOTE — Progress Notes (Signed)
OFFICE PROGRESS NOTE  CC Dr. 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  #2 bilateral subdural hematoma in August 2011 status post evacuation.  #3 HIV disease on highly active antiretroviral therapy or  PRIOR THERAPY: #1 metastatic Kaposi's sarcoma patient previously has been treated with chemotherapy including Taxol. He also received liposomal doxorubicin.  #2 unfortunately patient developed subdural hematoma and his chemotherapy was discontinued and he has been on observation only.  #3 patient has been on HART therapy for his HIV disease  CURRENT THERAPY:Observation for Kaposi sarcoma  INTERVAL HISTORY: Steven [name] 32 y.o. male returns for Followup visit.overall  He is working. He occasionally does get tired and fatigued but he has not noticed any fevers chills night sweats. He has no lower extremity edema. She has no nausea or vomiting no abdominal pain he has not noticed any Kaposi's outbreaks on his skin. He has not noticed any lower extremity pain. No bleeding problems. He continues to be on his HART therapy. He has gained significant amount of weight. Remainder of the 10 point review of systems is negative.  MEDICAL HISTORY: Past Medical History  Diagnosis Date  . HIV positive 03/23/09    Genotype Y181C  . HTN (hypertension)   . Syphilis 03/23/09-    1:2  . Kaposi's sarcoma   . Cancer   . Asthma     ALLERGIES:  is allergic to lisinopril; penicillins; and aspirin.  MEDICATIONS:  Current Outpatient Prescriptions  Medication Sig Dispense Refill  . Abacavir-Dolutegravir-Lamivud 600-50-300 MG TABS Take 1 tablet by mouth daily.  30 tablet  3  . acetaminophen (TYLENOL) 500 MG tablet Take 500 mg by mouth every 6 (six) hours as needed. Pain or fever      . amLODipine (NORVASC) 10 MG tablet Take 1 tablet (10 mg total) by mouth daily.  90 tablet  3  . fexofenadine  (ALLEGRA) 180 MG tablet Take 1 tablet (180 mg total) by mouth daily.  15 tablet  0  . fluticasone-salmeterol (ADVAIR HFA) 115-21 MCG/ACT inhaler Inhale 2 puffs into the lungs 2 (two) times daily.  1 Inhaler  12  . hydrochlorothiazide (HYDRODIURIL) 25 MG tablet Take 1 tablet (25 mg total) by mouth daily.  90 tablet  3  . levothyroxine (SYNTHROID, LEVOTHROID) 200 MCG tablet Take 200 mcg by mouth daily before breakfast.      . albuterol (PROVENTIL HFA;VENTOLIN HFA) 108 (90 BASE) MCG/ACT inhaler Inhale 2 puffs into the lungs every 4 (four) hours as needed for wheezing or shortness of breath.  1 Inhaler  0  . [DISCONTINUED] carvedilol (COREG) 12.5 MG tablet Take 1 tablet (12.5 mg total) by mouth 2 (two) times daily with a meal.  30 tablet  0  . [DISCONTINUED] lisinopril-hydrochlorothiazide (PRINZIDE,ZESTORETIC) 20-12.5 MG per tablet Take 1 tablet by mouth daily.       No current facility-administered medications for this visit.   Facility-Administered Medications Ordered in Other Visits  Medication Dose Route Frequency Provider Last Rate Last Dose  . heparin lock flush 100 unit/mL  500 Units Intravenous Once Victorino December, MD      . sodium chloride 0.9 % injection 10 mL  10 mL Intravenous PRN Victorino December, MD   10 mL at 03/22/12 1243  . sodium chloride 0.9 % injection 10 mL  10 mL Intravenous PRN Victorino December, MD        SURGICAL HISTORY: History reviewed. No pertinent past  surgical history.  REVIEW OF SYSTEMS:  Pertinent items are noted in HPI.   PHYSICAL EXAMINATION:  HEENT exam: EOMI PERRLA sclerae anicteric no conjunctival pallor oral mucosa is moist neck is supple there is no palpable cervical supraclavicular or axillary adenopathy no inguinal adenopathy. Lungs: Scattered wheezes bilaterally no rales or rhonchi. Heart he vascular: Regular rate rhythm no murmurs gallops or rubs abdomen: Soft nontender nondistended bowel sounds are present no hepatosplenomegaly. Extremities: Warm with 1+  edema no clubbing. Skin: There is noted to be cutaneous nodules on the right lower extremity measuring about 2 mm to 3 mm otherwise no other findings. Neuro: Patient's alert oriented x3 DTRs are +4 motor and sensory is intact strength is symmetrical.  ECOG PERFORMANCE STATUS: 0 - Asymptomatic  Blood pressure 127/85, pulse 74, temperature 98.7 F (37.1 C), temperature source Oral, resp. rate 18, height 6\' 1"  (1.854 m), weight 268 lb 3.2 oz (121.655 kg), SpO2 98.00%.  LABORATORY DATA: Lab Results  Component Value Date   WBC 4.4 05/09/2013   HGB 16.3 05/09/2013   HCT 51.6* 05/09/2013   MCV 80.1 05/09/2013   PLT 154 05/09/2013      Chemistry      Component Value Date/Time   NA 140 05/09/2013 1116   NA 139 04/09/2013 1627   NA 133 05/04/2010 1254   K 3.7 05/09/2013 1116   K 3.9 04/09/2013 1627   K 4.1 05/04/2010 1254   CL 100 04/09/2013 1627   CL 101 09/20/2012 1202   CL 100 05/04/2010 1254   CO2 26 05/09/2013 1116   CO2 28 04/09/2013 1627   CO2 29 05/04/2010 1254   BUN 10.8 05/09/2013 1116   BUN 17 04/09/2013 1627   BUN 12 05/04/2010 1254   CREATININE 1.4* 05/09/2013 1116   CREATININE 1.67* 04/09/2013 1627   CREATININE 1.31 02/08/2012 0316      Component Value Date/Time   CALCIUM 9.7 05/09/2013 1116   CALCIUM 9.8 04/09/2013 1627   CALCIUM 8.9 05/04/2010 1254   ALKPHOS 64 05/09/2013 1116   ALKPHOS 60 04/09/2013 1627   ALKPHOS 110* 05/04/2010 1254   AST 25 05/09/2013 1116   AST 24 04/09/2013 1627   AST 23 05/04/2010 1254   ALT 27 05/09/2013 1116   ALT 24 04/09/2013 1627   ALT 12 05/04/2010 1254   BILITOT 1.47* 05/09/2013 1116   BILITOT 1.1 04/09/2013 1627   BILITOT 1.10 05/04/2010 1254       RADIOGRAPHIC STUDIES:  No results found.  ASSESSMENT: 32 year old male with  #1 HIV disease on antiretrovirals therapy.  #2 Kaposi's sarcoma he has been heavily treated in the past. Right now I do think that his Kaposi's is silent he has no lower extremity edema which is a  presentation.at this time he is overall stable he doesn't have any progressive disease.Recent scans no evidence of progressive disease  PLAN:  #1 patient will continue to be followed every 6 months.  #2 he will also have port flushes every 2 months.  3. We discussed exercise and diet.  All questions were answered. The patient knows to call the clinic with any problems, questions or concerns. We can certainly see the patient much sooner if necessary.  I spent  25 minutes counseling the patient face to face. The total time spent in the appointment was 30 minutes.    Drue Second, MD Medical/Oncology [facility] [phone] (beeper) [phone] (Office)  05/09/2013, 12:21 PM

## 2013-05-09 NOTE — Patient Instructions (Signed)
Proceed with port flushes every 2 months  CT scans prior to next visit with blood work  I will see back in 6 months time

## 2013-05-09 NOTE — Telephone Encounter (Signed)
Monia Pouch called stating that the Triumeq was approved til this time next year 04/2014. Patient filled medication on 10/24

## 2013-05-09 NOTE — Telephone Encounter (Signed)
, °

## 2013-05-09 NOTE — Progress Notes (Signed)
Patient r/s for flush appt 05/13/2013

## 2013-05-09 NOTE — Telephone Encounter (Signed)
Called patient to see if he was able to obtain the new Rx for Triumeq and he said his insurance approved it and he had already started the new medication. Steven [name]

## 2013-06-03 DIAGNOSIS — E0500 Thyrotoxicosis with diffuse goiter without thyrotoxic crisis or storm: Secondary | ICD-10-CM | POA: Diagnosis not present

## 2013-06-03 DIAGNOSIS — E890 Postprocedural hypothyroidism: Secondary | ICD-10-CM | POA: Diagnosis not present

## 2013-06-14 DIAGNOSIS — E890 Postprocedural hypothyroidism: Secondary | ICD-10-CM | POA: Diagnosis not present

## 2013-06-14 DIAGNOSIS — E0500 Thyrotoxicosis with diffuse goiter without thyrotoxic crisis or storm: Secondary | ICD-10-CM | POA: Diagnosis not present

## 2013-07-01 ENCOUNTER — Ambulatory Visit: Payer: Medicare Other | Admitting: Internal Medicine

## 2013-07-10 ENCOUNTER — Other Ambulatory Visit: Payer: Medicare Other

## 2013-07-10 ENCOUNTER — Ambulatory Visit: Payer: Medicare Other

## 2013-07-10 DIAGNOSIS — Z79899 Other long term (current) drug therapy: Secondary | ICD-10-CM | POA: Diagnosis not present

## 2013-07-10 DIAGNOSIS — B20 Human immunodeficiency virus [HIV] disease: Secondary | ICD-10-CM | POA: Diagnosis not present

## 2013-07-10 DIAGNOSIS — Z113 Encounter for screening for infections with a predominantly sexual mode of transmission: Secondary | ICD-10-CM

## 2013-07-10 LAB — CBC WITH DIFFERENTIAL/PLATELET
Basophils Absolute: 0 10*3/uL (ref 0.0–0.1)
Basophils Relative: 1 % (ref 0–1)
Eosinophils Absolute: 0.8 10*3/uL — ABNORMAL HIGH (ref 0.0–0.7)
Eosinophils Relative: 16 % — ABNORMAL HIGH (ref 0–5)
HCT: 49.2 % (ref 39.0–52.0)
Hemoglobin: 16.6 g/dL (ref 13.0–17.0)
Lymphocytes Relative: 35 % (ref 12–46)
Lymphs Abs: 1.7 10*3/uL (ref 0.7–4.0)
MCH: 26 pg (ref 26.0–34.0)
MCHC: 33.7 g/dL (ref 30.0–36.0)
MCV: 77 fL — ABNORMAL LOW (ref 78.0–100.0)
Monocytes Absolute: 0.4 10*3/uL (ref 0.1–1.0)
Monocytes Relative: 7 % (ref 3–12)
Neutro Abs: 2 10*3/uL (ref 1.7–7.7)
Neutrophils Relative %: 41 % — ABNORMAL LOW (ref 43–77)
Platelets: 189 10*3/uL (ref 150–400)
RBC: 6.39 MIL/uL — ABNORMAL HIGH (ref 4.22–5.81)
RDW: 13.8 % (ref 11.5–15.5)
WBC: 4.9 10*3/uL (ref 4.0–10.5)

## 2013-07-10 LAB — COMPLETE METABOLIC PANEL WITH GFR
ALT: 19 U/L (ref 0–53)
AST: 21 U/L (ref 0–37)
Albumin: 4.5 g/dL (ref 3.5–5.2)
Alkaline Phosphatase: 57 U/L (ref 39–117)
BUN: 15 mg/dL (ref 6–23)
CO2: 31 mEq/L (ref 19–32)
Calcium: 10 mg/dL (ref 8.4–10.5)
Chloride: 98 mEq/L (ref 96–112)
Creat: 1.46 mg/dL — ABNORMAL HIGH (ref 0.50–1.35)
GFR, Est African American: 72 mL/min
GFR, Est Non African American: 62 mL/min
Glucose, Bld: 88 mg/dL (ref 70–99)
Potassium: 4 mEq/L (ref 3.5–5.3)
Sodium: 139 mEq/L (ref 135–145)
Total Bilirubin: 1.4 mg/dL — ABNORMAL HIGH (ref 0.3–1.2)
Total Protein: 7.8 g/dL (ref 6.0–8.3)

## 2013-07-10 LAB — LIPID PANEL
Cholesterol: 149 mg/dL (ref 0–200)
HDL: 42 mg/dL (ref >39)
LDL Cholesterol: 89 mg/dL (ref 0–99)
Total CHOL/HDL Ratio: 3.5 Ratio
Triglycerides: 92 mg/dL (ref <150)
VLDL: 18 mg/dL (ref 0–40)

## 2013-07-10 LAB — RPR

## 2013-07-10 LAB — T-HELPER CELL (CD4) - (RCID CLINIC ONLY)
CD4 % Helper T Cell: 11 % — ABNORMAL LOW (ref 33–55)
CD4 T Cell Abs: 190 /uL — ABNORMAL LOW (ref 400–2700)

## 2013-07-12 LAB — HIV-1 RNA QUANT-NO REFLEX-BLD
HIV 1 RNA Quant: <20 copies/mL (ref <20)
HIV-1 RNA Quant, Log: <1.3 {Log} (ref <1.30)

## 2013-07-15 ENCOUNTER — Telehealth: Payer: Self-pay | Admitting: Oncology

## 2013-07-15 ENCOUNTER — Other Ambulatory Visit: Payer: Self-pay | Admitting: *Deleted

## 2013-07-15 DIAGNOSIS — B20 Human immunodeficiency virus [HIV] disease: Secondary | ICD-10-CM

## 2013-07-15 MED ORDER — ABACAVIR-DOLUTEGRAVIR-LAMIVUD 600-50-300 MG PO TABS: 1.0000 | ORAL_TABLET | Freq: Every day | ORAL | Status: DC

## 2013-07-15 NOTE — Telephone Encounter (Signed)
, °

## 2013-07-16 ENCOUNTER — Ambulatory Visit (HOSPITAL_BASED_OUTPATIENT_CLINIC_OR_DEPARTMENT_OTHER): Payer: Medicare Other

## 2013-07-16 VITALS — BP 125/68 | HR 98 | Temp 98.3°F

## 2013-07-16 DIAGNOSIS — C469 Kaposi's sarcoma, unspecified: Secondary | ICD-10-CM | POA: Diagnosis not present

## 2013-07-16 DIAGNOSIS — Z452 Encounter for adjustment and management of vascular access device: Secondary | ICD-10-CM

## 2013-07-16 DIAGNOSIS — Z95828 Presence of other vascular implants and grafts: Secondary | ICD-10-CM

## 2013-07-16 MED ORDER — HEPARIN SOD (PORK) LOCK FLUSH 100 UNIT/ML IV SOLN
500.0000 [IU] | Freq: Once | INTRAVENOUS | Status: AC
  Administered 2013-07-16: 500 [IU] via INTRAVENOUS
  Filled 2013-07-16: qty 5

## 2013-07-16 MED ORDER — SODIUM CHLORIDE 0.9 % IJ SOLN
10.0000 mL | INTRAMUSCULAR | Status: DC | PRN
  Administered 2013-07-16: 10 mL via INTRAVENOUS
  Filled 2013-07-16: qty 10

## 2013-07-16 NOTE — Patient Instructions (Signed)
Implanted Port Instructions  An implanted port is a central line that has a round shape and is placed under the skin. It is used for long-term IV (intravenous) access for:  · Medicine.  · Fluids.  · Liquid nutrition, such as TPN (total parenteral nutrition).  · Blood samples.  Ports can be placed:  · In the chest area just below the collarbone (this is the most common place.)  · In the arms.  · In the belly (abdomen) area.  · In the legs.  PARTS OF THE PORT  A port has 2 main parts:  · The reservoir. The reservoir is round, disc-shaped, and will be a small, raised area under your skin.  · The reservoir is the part where a needle is inserted (accessed) to either give medicines or to draw blood.  · The catheter. The catheter is a long, slender tube that extends from the reservoir. The catheter is placed into a large vein.  · Medicine that is inserted into the reservoir goes into the catheter and then into the vein.  INSERTION OF THE PORT  · The port is surgically placed in either an operating room or in a procedural area (interventional radiology).  · Medicine may be given to help you relax during the procedure.  · The skin where the port will be inserted is numbed (local anesthetic).  · 1 or 2 small cuts (incisions) will be made in the skin to insert the port.  · The port can be used after it has been inserted.  INCISION SITE CARE  · The incision site may have small adhesive strips on it. This helps keep the incision site closed. Sometimes, no adhesive strips are placed. Instead of adhesive strips, a special kind of surgical glue is used to keep the incision closed.  · If adhesive strips were placed on the incision sites, do not take them off. They will fall off on their own.  · The incision site may be sore for 1 to 2 days. Pain medicine can help.  · Do not get the incision site wet. Bathe or shower as directed by your caregiver.  · The incision site should heal in 5 to 7 days. A small scar may form after the  incision has healed.  ACCESSING THE PORT  Special steps must be taken to access the port:  · Before the port is accessed, a numbing cream can be placed on the skin. This helps numb the skin over the port site.  · A sterile technique is used to access the port.  · The port is accessed with a needle. Only "non-coring" port needles should be used to access the port. Once the port is accessed, a blood return should be checked. This helps ensure the port is in the vein and is not clogged (clotted).  · If your caregiver believes your port should remain accessed, a clear (transparent) bandage will be placed over the needle site. The bandage and needle will need to be changed every week or as directed by your caregiver.  · Keep the bandage covering the needle clean and dry. Do not get it wet. Follow your caregiver's instructions on how to take a shower or bath when the port is accessed.  · If your port does not need to stay accessed, no bandage is needed over the port.  FLUSHING THE PORT  Flushing the port keeps it from getting clogged. How often the port is flushed depends on:  · If a   constant infusion is running. If a constant infusion is running, the port may not need to be flushed.  · If intermittent medicines are given.  · If the port is not being used.  For intermittent medicines:  · The port will need to be flushed:  · After medicines have been given.  · After blood has been drawn.  · As part of routine maintenance.  · A port is normally flushed with:  · Normal saline.  · Heparin.  · Follow your caregiver's advice on how often, how much, and the type of flush to use on your port.  IMPORTANT PORT INFORMATION  · Tell your caregiver if you are allergic to heparin.  · After your port is placed, you will get a manufacturer's information card. The card has information about your port. Keep this card with you at all times.  · There are many types of ports available. Know what kind of port you have.  · In case of an  emergency, it may be helpful to wear a medical alert bracelet. This can help alert health care workers that you have a port.  · The port can stay in for as long as your caregiver believes it is necessary.  · When it is time for the port to come out, surgery will be done to remove it. The surgery will be similar to how the port was put in.  · If you are in the hospital or clinic:  · Your port will be taken care of and flushed by a nurse.  · If you are at home:  · A home health care nurse may give medicines and take care of the port.  · You or a family member can get special training and directions for giving medicine and taking care of the port at home.  SEEK IMMEDIATE MEDICAL CARE IF:   · Your port does not flush or you are unable to get a blood return.  · New drainage or pus is coming from the incision.  · A bad smell is coming from the incision site.  · You develop swelling or increased redness at the incision site.  · You develop increased swelling or pain at the port site.  · You develop swelling or pain in the surrounding skin near the port.  · You have an oral temperature above 102° F (38.9° C), not controlled by medicine.  MAKE SURE YOU:   · Understand these instructions.  · Will watch your condition.  · Will get help right away if you are not doing well or get worse.  Document Released: 06/27/2005 Document Revised: 09/19/2011 Document Reviewed: 09/18/2008  [employer]® Patient Information ©2014 [employer].

## 2013-07-24 ENCOUNTER — Telehealth: Payer: Self-pay | Admitting: *Deleted

## 2013-07-24 ENCOUNTER — Ambulatory Visit: Payer: Medicare Other | Admitting: Infectious Diseases

## 2013-07-24 NOTE — Telephone Encounter (Signed)
Patient called to advise that he was not able to make it to clinic today and rescheduled his appt.

## 2013-07-29 ENCOUNTER — Encounter: Payer: Self-pay | Admitting: Infectious Diseases

## 2013-07-29 ENCOUNTER — Ambulatory Visit (INDEPENDENT_AMBULATORY_CARE_PROVIDER_SITE_OTHER): Payer: Medicare Other | Admitting: Infectious Diseases

## 2013-07-29 VITALS — BP 124/80 | HR 96 | Temp 98.6°F | Ht 73.0 in | Wt 267.0 lb

## 2013-07-29 DIAGNOSIS — B20 Human immunodeficiency virus [HIV] disease: Secondary | ICD-10-CM | POA: Diagnosis not present

## 2013-07-29 DIAGNOSIS — M25521 Pain in right elbow: Secondary | ICD-10-CM

## 2013-07-29 DIAGNOSIS — M25529 Pain in unspecified elbow: Secondary | ICD-10-CM

## 2013-07-29 DIAGNOSIS — C469 Kaposi's sarcoma, unspecified: Secondary | ICD-10-CM

## 2013-07-29 NOTE — Progress Notes (Signed)
   Subjective:    Patient ID: Steven [name], male    DOB: 11-28-1980, 33 y.o.   MRN: [id number]  HPI 33 yo M with hx of AIDS, KS. He was changed from TRV/ISN in 2014 to triumeq. He had a mild increase in his Cr prior to this, this has improved minimally. No side effects from this.  Has had 1 month of R elbow pain. 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 No swelling. No paresthesias.  Is off CTX for KS, will have f/u CT scan in April.   HIV 1 RNA Quant (copies/mL)  Date Value  07/10/2013 <20   04/09/2013 <20   10/08/2012 <20      CD4 T Cell Abs (/uL)  Date Value  07/10/2013 190*  04/09/2013 200*  10/08/2012 210*    Review of Systems  Constitutional: Negative for appetite change and unexpected weight change.  Gastrointestinal: Negative for diarrhea and constipation.  Genitourinary: Negative for difficulty urinating.  Musculoskeletal: Positive for arthralgias.       Objective:   Physical Exam  Constitutional: He appears well-developed and well-nourished.  HENT:  Mouth/Throat: No oropharyngeal exudate.  Eyes: EOM are normal. Pupils are equal, round, and reactive to light.  Neck: Neck supple.  Cardiovascular: Normal rate, regular rhythm and normal heart sounds.   Pulmonary/Chest: Effort normal and breath sounds normal.  Abdominal: Soft. Bowel sounds are normal. He exhibits no distension. There is no tenderness.  Musculoskeletal:       Arms: Lymphadenopathy:    He has no cervical adenopathy.          Assessment & Plan:

## 2013-07-29 NOTE — Assessment & Plan Note (Signed)
Asked him to try ice, tylenol.

## 2013-07-29 NOTE — Assessment & Plan Note (Signed)
He is doing well despite a drop in his CD4 to under 200. Will not restart his bactrim as he is undetectable and will hopefully be back over 200 at next visit. vax are up to date. Offered/refused condoms. rtc 6 months.

## 2013-07-29 NOTE — Assessment & Plan Note (Signed)
Appears to be doing well, appreciate Dr Marella Bile f/u.

## 2013-08-19 ENCOUNTER — Other Ambulatory Visit: Payer: Self-pay | Admitting: *Deleted

## 2013-08-19 DIAGNOSIS — B20 Human immunodeficiency virus [HIV] disease: Secondary | ICD-10-CM

## 2013-08-19 MED ORDER — ABACAVIR-DOLUTEGRAVIR-LAMIVUD 600-50-300 MG PO TABS: 1.0000 | ORAL_TABLET | Freq: Every day | ORAL | Status: DC

## 2013-09-09 ENCOUNTER — Ambulatory Visit (HOSPITAL_BASED_OUTPATIENT_CLINIC_OR_DEPARTMENT_OTHER): Payer: Medicare Other

## 2013-09-09 VITALS — BP 128/72 | HR 78 | Temp 97.9°F | Resp 19

## 2013-09-09 DIAGNOSIS — C469 Kaposi's sarcoma, unspecified: Secondary | ICD-10-CM

## 2013-09-09 DIAGNOSIS — Z452 Encounter for adjustment and management of vascular access device: Secondary | ICD-10-CM | POA: Diagnosis not present

## 2013-09-09 DIAGNOSIS — Z95828 Presence of other vascular implants and grafts: Secondary | ICD-10-CM

## 2013-09-09 MED ORDER — SODIUM CHLORIDE 0.9 % IJ SOLN
10.0000 mL | INTRAMUSCULAR | Status: DC | PRN
  Administered 2013-09-09: 10 mL via INTRAVENOUS
  Filled 2013-09-09: qty 10

## 2013-09-09 MED ORDER — HEPARIN SOD (PORK) LOCK FLUSH 100 UNIT/ML IV SOLN
500.0000 [IU] | Freq: Once | INTRAVENOUS | Status: AC
  Administered 2013-09-09: 500 [IU] via INTRAVENOUS
  Filled 2013-09-09: qty 5

## 2013-09-09 NOTE — Patient Instructions (Signed)

## 2013-10-28 ENCOUNTER — Other Ambulatory Visit (HOSPITAL_BASED_OUTPATIENT_CLINIC_OR_DEPARTMENT_OTHER): Payer: Medicare Other

## 2013-10-28 ENCOUNTER — Ambulatory Visit (HOSPITAL_COMMUNITY)
Admission: RE | Admit: 2013-10-28 | Discharge: 2013-10-28 | Disposition: A | Discharge status: Home / Self Care | Payer: Medicare Other | Source: Ambulatory Visit | Attending: Oncology | Admitting: Oncology

## 2013-10-28 ENCOUNTER — Encounter (HOSPITAL_COMMUNITY): Payer: Self-pay

## 2013-10-28 DIAGNOSIS — N289 Disorder of kidney and ureter, unspecified: Secondary | ICD-10-CM | POA: Diagnosis not present

## 2013-10-28 DIAGNOSIS — R599 Enlarged lymph nodes, unspecified: Secondary | ICD-10-CM | POA: Insufficient documentation

## 2013-10-28 DIAGNOSIS — C469 Kaposi's sarcoma, unspecified: Secondary | ICD-10-CM | POA: Diagnosis not present

## 2013-10-28 DIAGNOSIS — C7952 Secondary malignant neoplasm of bone marrow: Secondary | ICD-10-CM

## 2013-10-28 DIAGNOSIS — C7951 Secondary malignant neoplasm of bone: Secondary | ICD-10-CM | POA: Diagnosis not present

## 2013-10-28 DIAGNOSIS — R591 Generalized enlarged lymph nodes: Secondary | ICD-10-CM

## 2013-10-28 DIAGNOSIS — I517 Cardiomegaly: Secondary | ICD-10-CM | POA: Insufficient documentation

## 2013-10-28 LAB — CBC WITH DIFFERENTIAL/PLATELET
BASO%: 0.5 % (ref 0.0–2.0)
Basophils Absolute: 0 10*3/uL (ref 0.0–0.1)
EOS ABS: 0.4 10*3/uL (ref 0.0–0.5)
EOS%: 5.7 % (ref 0.0–7.0)
HCT: 51.5 % — ABNORMAL HIGH (ref 38.4–49.9)
HGB: 16.3 g/dL (ref 13.0–17.1)
LYMPH%: 34 % (ref 14.0–49.0)
MCH: 25.4 pg — ABNORMAL LOW (ref 27.2–33.4)
MCHC: 31.7 g/dL — AB (ref 32.0–36.0)
MCV: 80.3 fL (ref 79.3–98.0)
MONO#: 0.5 10*3/uL (ref 0.1–0.9)
MONO%: 7.8 % (ref 0.0–14.0)
NEUT%: 52 % (ref 39.0–75.0)
NEUTROS ABS: 3.2 10*3/uL (ref 1.5–6.5)
PLATELETS: 170 10*3/uL (ref 140–400)
RBC: 6.41 10*6/uL — AB (ref 4.20–5.82)
RDW: 13.9 % (ref 11.0–14.6)
WBC: 6.2 10*3/uL (ref 4.0–10.3)
lymph#: 2.1 10*3/uL (ref 0.9–3.3)

## 2013-10-28 LAB — COMPREHENSIVE METABOLIC PANEL (CC13)
ALBUMIN: 4.4 g/dL (ref 3.5–5.0)
ALT: 44 U/L (ref 0–55)
AST: 32 U/L (ref 5–34)
Alkaline Phosphatase: 73 U/L (ref 40–150)
Anion Gap: 12 mEq/L — ABNORMAL HIGH (ref 3–11)
BILIRUBIN TOTAL: 1.36 mg/dL — AB (ref 0.20–1.20)
BUN: 12.9 mg/dL (ref 7.0–26.0)
CO2: 30 meq/L — AB (ref 22–29)
Calcium: 9.6 mg/dL (ref 8.4–10.4)
Chloride: 103 mEq/L (ref 98–109)
Creatinine: 1.5 mg/dL — ABNORMAL HIGH (ref 0.7–1.3)
GLUCOSE: 96 mg/dL (ref 70–140)
POTASSIUM: 3.3 meq/L — AB (ref 3.5–5.1)
SODIUM: 144 meq/L (ref 136–145)
TOTAL PROTEIN: 7.9 g/dL (ref 6.4–8.3)

## 2013-10-28 LAB — LACTATE DEHYDROGENASE (CC13): LDH: 172 U/L (ref 125–245)

## 2013-10-28 MED ORDER — IOHEXOL 300 MG/ML  SOLN
100.0000 mL | Freq: Once | INTRAMUSCULAR | Status: AC | PRN
  Administered 2013-10-28: 100 mL via INTRAVENOUS

## 2013-10-30 ENCOUNTER — Telehealth: Payer: Self-pay | Admitting: Oncology

## 2013-10-30 NOTE — Telephone Encounter (Signed)
S/W PT ADVISED 4/24 APPT CX'D DUE TO MD LOA. ADVISED APPT MOVED TO 5/4 (FLUSH APPT ALREADY SCHED THIS DAY) @ 2P WITH DR. Earnest Conroy. PT VERBALIZED UNDERSTANDING

## 2013-11-01 ENCOUNTER — Ambulatory Visit: Payer: Medicare Other | Admitting: Oncology

## 2013-11-01 ENCOUNTER — Telehealth: Payer: Self-pay | Admitting: *Deleted

## 2013-11-01 NOTE — Telephone Encounter (Signed)
Message copied by Hebert Soho on Fri Nov 01, 2013  6:45 PM ------      Message from: Minette Headland      Created: Wed Oct 30, 2013  1:03 AM       Patient has potassium of 3.3.  Is he taking any potassium supplements, diuretics, or having any diarrhea?            Thanks,       LC      ----- Message -----         From: [facility]         Sent: 10/28/2013   4:22 PM           To: Deatra Robinson, MD                   ------

## 2013-11-01 NOTE — Telephone Encounter (Signed)
Tried calling patient on cell phone and at home but no way of leaving a message to discuss labs.

## 2013-11-07 ENCOUNTER — Ambulatory Visit: Payer: Medicare Other

## 2013-11-11 ENCOUNTER — Ambulatory Visit (HOSPITAL_BASED_OUTPATIENT_CLINIC_OR_DEPARTMENT_OTHER): Payer: Medicare Other | Admitting: Hematology and Oncology

## 2013-11-11 ENCOUNTER — Ambulatory Visit: Payer: Medicare Other

## 2013-11-11 ENCOUNTER — Telehealth: Payer: Self-pay | Admitting: Adult Health

## 2013-11-11 VITALS — BP 128/77 | HR 83 | Temp 98.7°F | Resp 20 | Ht 73.0 in | Wt 275.5 lb

## 2013-11-11 DIAGNOSIS — E876 Hypokalemia: Secondary | ICD-10-CM | POA: Diagnosis not present

## 2013-11-11 DIAGNOSIS — C469 Kaposi's sarcoma, unspecified: Secondary | ICD-10-CM | POA: Diagnosis not present

## 2013-11-11 DIAGNOSIS — R599 Enlarged lymph nodes, unspecified: Secondary | ICD-10-CM

## 2013-11-11 DIAGNOSIS — B20 Human immunodeficiency virus [HIV] disease: Secondary | ICD-10-CM | POA: Diagnosis not present

## 2013-11-11 DIAGNOSIS — M949 Disorder of cartilage, unspecified: Secondary | ICD-10-CM

## 2013-11-11 DIAGNOSIS — M899 Disorder of bone, unspecified: Secondary | ICD-10-CM

## 2013-11-11 DIAGNOSIS — Z95828 Presence of other vascular implants and grafts: Secondary | ICD-10-CM

## 2013-11-11 MED ORDER — HEPARIN SOD (PORK) LOCK FLUSH 100 UNIT/ML IV SOLN
500.0000 [IU] | Freq: Once | INTRAVENOUS | Status: AC
  Administered 2013-11-11: 500 [IU] via INTRAVENOUS
  Filled 2013-11-11: qty 5

## 2013-11-11 MED ORDER — SODIUM CHLORIDE 0.9 % IJ SOLN
10.0000 mL | INTRAMUSCULAR | Status: DC | PRN
  Administered 2013-11-11: 10 mL via INTRAVENOUS
  Filled 2013-11-11: qty 10

## 2013-11-11 MED ORDER — POTASSIUM CHLORIDE CRYS ER 20 MEQ PO TBCR: 20.0000 meq | EXTENDED_RELEASE_TABLET | Freq: Every day | ORAL | Status: DC

## 2013-11-11 NOTE — Patient Instructions (Signed)

## 2013-11-11 NOTE — Progress Notes (Signed)
OFFICE PROGRESS NOTE  CC Dr. 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  #2 bilateral subdural hematoma in August 2011 status post evacuation.  #3 HIV disease on highly active antiretroviral therapy   PRIOR THERAPY: As per reviously documented note:  #1 metastatic Kaposi's sarcoma patient previously has been treated with chemotherapy including Taxol. He also received liposomal doxorubicin.  #2 unfortunately patient developed subdural hematoma and his chemotherapy was discontinued and he has been on observation only.  #3 patient has been on HART therapy for his HIV disease  [place] for Kaposi sarcoma  INTERVAL HISTORY: Steven [name] 33 y.o. male returns for Followup visit for his Kaposi's sarcoma. He says that he is on one antiretroviral therapy at present and  his viral load is undetectable. He does complain of a purplish color nodules on his  right upper extremity, thigh  and also on the shoulder area that he noticed 2-3 weeks ago. He denies any fever, chest pain, palpitations, weight loss, blood in the stool or blood in the urine, or cough.  His CT of the chest/ abdomen and pelvis revealed small bowel mesenteric lymphadenopathy and also widely spread lytic metastasis which are stable when compared to the previous CAT scans which were performed in March 2014. However he does have prevascular mass measuring about 2.4 over 4.8 cm which is new from the prior exam nodal origin versus ?thymus.  He says his bilateral lower extremity swelling is much improved and he needs to do exercise Remainder of the 10 point review of systems is negative.  MEDICAL HISTORY: Past Medical History  Diagnosis Date  . HIV positive 03/23/09    Genotype Y181C  . HTN (hypertension)   . Syphilis 03/23/09-    1:2  . Kaposi's sarcoma    . Cancer   . Asthma     ALLERGIES:  is allergic to lisinopril; penicillins; and aspirin.  MEDICATIONS:  Current Outpatient Prescriptions  Medication Sig Dispense Refill  . Abacavir-Dolutegravir-Lamivud 600-50-300 MG TABS Take 1 tablet by mouth daily.  30 tablet  6  . acetaminophen (TYLENOL) 500 MG tablet Take 500 mg by mouth every 6 (six) hours as needed. Pain or fever      . albuterol (PROVENTIL HFA;VENTOLIN HFA) 108 (90 BASE) MCG/ACT inhaler Inhale 2 puffs into the lungs every 4 (four) hours as needed for wheezing or shortness of breath.      Marland Kitchen amLODipine (NORVASC) 10 MG tablet Take 1 tablet (10 mg total) by mouth daily.  90 tablet  3  . fexofenadine (ALLEGRA) 180 MG tablet Take 1 tablet (180 mg total) by mouth daily.  15 tablet  0  . fluticasone-salmeterol (ADVAIR HFA) 115-21 MCG/ACT inhaler Inhale 2 puffs into the lungs 2 (two) times daily.  1 Inhaler  12  . hydrochlorothiazide (HYDRODIURIL) 25 MG tablet Take 1 tablet (25 mg total) by mouth daily.  90 tablet  3  . levothyroxine (SYNTHROID, LEVOTHROID) 200 MCG tablet Take 200 mcg by mouth daily before breakfast.      . potassium chloride SA (K-DUR,KLOR-CON) 20 MEQ tablet Take 1 tablet (20 mEq total) by mouth daily.  20 tablet  0  . [DISCONTINUED] carvedilol (COREG) 12.5 MG tablet Take 1 tablet (12.5 mg total) by mouth 2 (two) times daily with a meal.  30 tablet  0  . [DISCONTINUED] lisinopril-hydrochlorothiazide (PRINZIDE,ZESTORETIC) 20-12.5 MG per tablet Take 1 tablet by  mouth daily.       No current facility-administered medications for this visit.   Facility-Administered Medications Ordered in Other Visits  Medication Dose Route Frequency Provider Last Rate Last Dose  . heparin lock flush 100 unit/mL  500 Units Intravenous Once Deatra Robinson, MD      . sodium chloride 0.9 % injection 10 mL  10 mL Intravenous PRN Deatra Robinson, MD   10 mL at 03/22/12 1243  . sodium chloride 0.9 % injection 10 mL  10 mL Intravenous PRN Deatra Robinson, MD      . sodium chloride 0.9 % injection 10 mL  10 mL Intravenous PRN Wilmon Arms, MD   10 mL at 11/11/13 1555    SURGICAL HISTORY: No past surgical history on file.  PHYSICAL EXAMINATION:  HEENT exam: EOMI PERRLA sclerae anicteric no conjunctival pallor  oral mucosa is moist neck is supple there is no palpable cervical supraclavicular or axillary adenopathy no inguinal adenopathy. Lungs: Clear to auscultation no rales or rhonchi.  Heart he vascular: Regular rate rhythm no murmurs gallops or rubs  abdomen: Soft nontender nondistended bowel sounds are present no hepatosplenomegaly.  Extremities: Warm with 1+ edema no clubbing  Skin: Purplish color to cutaneous nodules noted on the right upper extremity shoulder area and also left thigh.   Neuro: Patient's alert oriented x3 DTRs are +4 motor and sensory is intact strength is symmetrical.  ECOG PERFORMANCE STATUS: 0 - Asymptomatic  Blood pressure 128/77, pulse 83, temperature 98.7 F (37.1 C), temperature source Oral, resp. rate 20, height 6\' 1"  (1.854 m), weight 275 lb 8 oz (124.966 kg).  LABORATORY DATA: Lab Results  Component Value Date   WBC 6.2 10/28/2013   HGB 16.3 10/28/2013   HCT 51.5* 10/28/2013   MCV 80.3 10/28/2013   PLT 170 10/28/2013      Chemistry      Component Value Date/Time   NA 144 10/28/2013 1612   NA 139 07/10/2013 1104   NA 133 05/04/2010 1254   K 3.3* 10/28/2013 1612   K 4.0 07/10/2013 1104   K 4.1 05/04/2010 1254   CL 98 07/10/2013 1104   CL 101 09/20/2012 1202   CL 100 05/04/2010 1254   CO2 30* 10/28/2013 1612   CO2 31 07/10/2013 1104   CO2 29 05/04/2010 1254   BUN 12.9 10/28/2013 1612   BUN 15 07/10/2013 1104   BUN 12 05/04/2010 1254   CREATININE 1.5* 10/28/2013 1612   CREATININE 1.46* 07/10/2013 1104   CREATININE 1.31 02/08/2012 0316      Component Value Date/Time   CALCIUM 9.6 10/28/2013 1612   CALCIUM 10.0 07/10/2013 1104   CALCIUM 8.9 05/04/2010 1254   ALKPHOS 73 10/28/2013 1612    ALKPHOS 57 07/10/2013 1104   ALKPHOS 110* 05/04/2010 1254   AST 32 10/28/2013 1612   AST 21 07/10/2013 1104   AST 23 05/04/2010 1254   ALT 44 10/28/2013 1612   ALT 19 07/10/2013 1104   ALT 12 05/04/2010 1254   BILITOT 1.36* 10/28/2013 1612   BILITOT 1.4* 07/10/2013 1104   BILITOT 1.10 05/04/2010 1254        ASSESSMENT/PLAN:  33 year old male with  #1 HIV disease on antiretrovirals therapy.  #2 Kaposi's sarcoma he has been heavily treated in the past:His recent CAT scans revealed stable osteolytic lesions and also mesenteric lymphadenopathy and a new prevascular mass nodal origin versus thymus and also clinical examination revealing new cutenous nodules most likely Kaposi's sarcoma disease  progression .However we'll obtain the PET scan and also will refer the patient to the dermatology for further evaluation.   #3 port flushes every 2 months.  #4 hypokalemia-replaced  #5 follow up in 4-6 weeks after dental evaluation and with a PET scan reports for further management   All questions were answered. The patient knows to call the clinic with any problems, questions or concerns. We can certainly see the patient much sooner if necessary.  I spent  20 minutes counseling the patient face to face. The total time spent in the appointment was 30 minutes.    Wilmon Arms, MD Medical/Oncology [facility] [phone] (Office)  11/11/2013, 5:52 PM

## 2013-11-11 NOTE — Telephone Encounter (Signed)
, °

## 2013-11-12 ENCOUNTER — Telehealth: Payer: Self-pay | Admitting: Hematology and Oncology

## 2013-11-12 ENCOUNTER — Ambulatory Visit: Payer: Medicare Other

## 2013-11-12 NOTE — Telephone Encounter (Signed)
, °

## 2013-11-19 DIAGNOSIS — D2360 Other benign neoplasm of skin of unspecified upper limb, including shoulder: Secondary | ICD-10-CM | POA: Diagnosis not present

## 2013-11-19 DIAGNOSIS — D235 Other benign neoplasm of skin of trunk: Secondary | ICD-10-CM | POA: Diagnosis not present

## 2013-11-22 ENCOUNTER — Ambulatory Visit (HOSPITAL_COMMUNITY): Admission: RE | Admit: 2013-11-22 | Payer: Medicare Other | Source: Ambulatory Visit

## 2013-11-29 ENCOUNTER — Other Ambulatory Visit: Payer: Self-pay | Admitting: Hematology and Oncology

## 2013-12-06 ENCOUNTER — Ambulatory Visit (HOSPITAL_COMMUNITY)
Admission: RE | Admit: 2013-12-06 | Discharge: 2013-12-06 | Disposition: A | Discharge status: Home / Self Care | Payer: Medicare Other | Source: Ambulatory Visit | Attending: Hematology and Oncology | Admitting: Hematology and Oncology

## 2013-12-06 DIAGNOSIS — C469 Kaposi's sarcoma, unspecified: Secondary | ICD-10-CM | POA: Insufficient documentation

## 2013-12-06 DIAGNOSIS — C7951 Secondary malignant neoplasm of bone: Secondary | ICD-10-CM | POA: Diagnosis not present

## 2013-12-06 DIAGNOSIS — R222 Localized swelling, mass and lump, trunk: Secondary | ICD-10-CM | POA: Diagnosis not present

## 2013-12-06 DIAGNOSIS — C801 Malignant (primary) neoplasm, unspecified: Secondary | ICD-10-CM | POA: Diagnosis not present

## 2013-12-06 LAB — GLUCOSE, CAPILLARY: Glucose-Capillary: 100 mg/dL — ABNORMAL HIGH (ref 70–99)

## 2013-12-06 MED ORDER — FLUDEOXYGLUCOSE F - 18 (FDG) INJECTION
14.6000 | Freq: Once | INTRAVENOUS | Status: AC | PRN
  Administered 2013-12-06: 14.6 via INTRAVENOUS

## 2013-12-13 ENCOUNTER — Other Ambulatory Visit: Payer: Self-pay | Admitting: Internal Medicine

## 2013-12-13 DIAGNOSIS — I10 Essential (primary) hypertension: Secondary | ICD-10-CM

## 2013-12-13 MED ORDER — HYDROCHLOROTHIAZIDE 25 MG PO TABS: 25.0000 mg | ORAL_TABLET | Freq: Every day | ORAL | Status: DC

## 2013-12-13 MED ORDER — AMLODIPINE BESYLATE 10 MG PO TABS: 10.0000 mg | ORAL_TABLET | Freq: Every day | ORAL | Status: DC

## 2013-12-13 MED ORDER — FLUTICASONE-SALMETEROL 115-21 MCG/ACT IN AERO: 2.0000 | INHALATION_SPRAY | Freq: Two times a day (BID) | RESPIRATORY_TRACT | Status: DC

## 2013-12-13 MED ORDER — ALBUTEROL SULFATE HFA 108 (90 BASE) MCG/ACT IN AERS: 2.0000 | INHALATION_SPRAY | RESPIRATORY_TRACT | Status: DC | PRN

## 2013-12-13 NOTE — Telephone Encounter (Signed)
Pt. would like refill on Advair, Abuterol, Norvasc, & hydrochlorothiazide (HYDRODIURIL)...Marland KitchenMarland KitchenPlease call patient when prescriptions are ready.

## 2013-12-17 ENCOUNTER — Other Ambulatory Visit: Payer: Self-pay

## 2013-12-17 DIAGNOSIS — C469 Kaposi's sarcoma, unspecified: Secondary | ICD-10-CM

## 2013-12-17 NOTE — Progress Notes (Signed)
Per [facility] - order placed to refer pt to Dr. Caleen Jobs at [facility].  [place] notified. LMOVM for pt - referral made to [facility] for specialist for him - should be hearing from Dr. Aida Puffer.  Call clinic with any questions.

## 2013-12-19 ENCOUNTER — Encounter: Payer: Self-pay | Admitting: *Deleted

## 2013-12-19 NOTE — Progress Notes (Signed)
Patient called back after email sent, informed him that since Dr Humphrey Rolls is no longer with our practice, we would like for him to see the specialist at [facility] of the concerns he has of not receiving any chemo in several years. Once he has had a second opinion, we will be happy to accommodate patient in any way we can. Patient has appts at this time to return and we will reassess his medical needs at that time. Patient verbalized understanding of plan.

## 2013-12-24 ENCOUNTER — Telehealth: Payer: Self-pay | Admitting: Oncology

## 2013-12-24 NOTE — Telephone Encounter (Signed)
Faxed pt medical records to Dr. Arman Bogus office. Fax-[phone]-attenColletta Maryland

## 2013-12-25 DIAGNOSIS — C499 Malignant neoplasm of connective and soft tissue, unspecified: Secondary | ICD-10-CM | POA: Diagnosis not present

## 2013-12-25 DIAGNOSIS — C469 Kaposi's sarcoma, unspecified: Secondary | ICD-10-CM | POA: Diagnosis not present

## 2013-12-26 ENCOUNTER — Ambulatory Visit: Payer: Medicare Other | Attending: Internal Medicine | Admitting: Internal Medicine

## 2013-12-26 ENCOUNTER — Encounter: Payer: Self-pay | Admitting: Internal Medicine

## 2013-12-26 VITALS — BP 116/80 | HR 73 | Temp 98.8°F | Resp 18 | Ht 73.0 in | Wt 274.0 lb

## 2013-12-26 DIAGNOSIS — E0500 Thyrotoxicosis with diffuse goiter without thyrotoxic crisis or storm: Secondary | ICD-10-CM | POA: Diagnosis not present

## 2013-12-26 DIAGNOSIS — Z21 Asymptomatic human immunodeficiency virus [HIV] infection status: Secondary | ICD-10-CM | POA: Diagnosis not present

## 2013-12-26 DIAGNOSIS — I10 Essential (primary) hypertension: Secondary | ICD-10-CM | POA: Diagnosis not present

## 2013-12-26 DIAGNOSIS — Z23 Encounter for immunization: Secondary | ICD-10-CM | POA: Insufficient documentation

## 2013-12-26 DIAGNOSIS — S61209A Unspecified open wound of unspecified finger without damage to nail, initial encounter: Secondary | ICD-10-CM | POA: Insufficient documentation

## 2013-12-26 DIAGNOSIS — W540XXA Bitten by dog, initial encounter: Secondary | ICD-10-CM | POA: Insufficient documentation

## 2013-12-26 DIAGNOSIS — S61259A Open bite of unspecified finger without damage to nail, initial encounter: Secondary | ICD-10-CM

## 2013-12-26 MED ORDER — HYDROCHLOROTHIAZIDE 25 MG PO TABS: 25.0000 mg | ORAL_TABLET | Freq: Every day | ORAL | Status: DC

## 2013-12-26 MED ORDER — AMLODIPINE BESYLATE 10 MG PO TABS: 10.0000 mg | ORAL_TABLET | Freq: Every day | ORAL | Status: DC

## 2013-12-26 MED ORDER — DOXYCYCLINE HYCLATE 100 MG PO TABS
100.0000 mg | ORAL_TABLET | Freq: Two times a day (BID) | ORAL | Status: DC
Start: 2013-12-26 — End: 2014-02-05

## 2013-12-26 NOTE — Progress Notes (Signed)
Patient here for animal bite.  Patient needs refills on all BP meds (Norvasc and HCTZ).

## 2013-12-26 NOTE — Progress Notes (Signed)
Patient ID: Steven [name], male   DOB: 1981-06-21, 33 y.o.   MRN: [id number]  CC: HTN and dog bite  HPI:  Patient reports that he suffered a dog bit by his friends dog last night.  He reports that he did not seek medical attention because he was coming to appointment today.  Patient reports that he is not up to date on tetanus shot and is unaware if the dog is vaccinated against rabies.  Patient has a history of graves disease and is unhappy with his current endocrinologist (Dr. Legrand Como Altheimer) at [facility] , he is due for follow up in 2 months.  Patient reports that he is compliant with medication regimen.   Allergies  Allergen Reactions  . Lisinopril Swelling    Swelling of lower lip while on lisinopril  . Penicillins Hives and Swelling    REACTION: rash and swelling  . Aspirin Nausea Only   Past Medical History  Diagnosis Date  . HIV positive 03/23/09    Genotype Y181C  . HTN (hypertension)   . Syphilis 03/23/09-    1:2  . Kaposi's sarcoma   . Cancer   . Asthma    Current Outpatient Prescriptions on File Prior to Visit  Medication Sig Dispense Refill  . Abacavir-Dolutegravir-Lamivud 600-50-300 MG TABS Take 1 tablet by mouth daily.  30 tablet  6  . acetaminophen (TYLENOL) 500 MG tablet Take 500 mg by mouth every 6 (six) hours as needed. Pain or fever      . albuterol (PROVENTIL HFA;VENTOLIN HFA) 108 (90 BASE) MCG/ACT inhaler Inhale 2 puffs into the lungs every 4 (four) hours as needed for wheezing or shortness of breath.  1 Inhaler  3  . amLODipine (NORVASC) 10 MG tablet Take 1 tablet (10 mg total) by mouth daily.  90 tablet  3  . fexofenadine (ALLEGRA) 180 MG tablet Take 1 tablet (180 mg total) by mouth daily.  15 tablet  0  . fluticasone-salmeterol (ADVAIR HFA) 115-21 MCG/ACT inhaler Inhale 2 puffs into the lungs 2 (two) times daily.  1 Inhaler  12  . hydrochlorothiazide (HYDRODIURIL) 25 MG tablet Take 1 tablet (25 mg total) by mouth daily.  90 tablet  3  .  levothyroxine (SYNTHROID, LEVOTHROID) 200 MCG tablet Take 200 mcg by mouth daily before breakfast.      . potassium chloride SA (K-DUR,KLOR-CON) 20 MEQ tablet TAKE 1 TABLET BY MOUTH DAILY  20 tablet  0  . [DISCONTINUED] carvedilol (COREG) 12.5 MG tablet Take 1 tablet (12.5 mg total) by mouth 2 (two) times daily with a meal.  30 tablet  0  . [DISCONTINUED] lisinopril-hydrochlorothiazide (PRINZIDE,ZESTORETIC) 20-12.5 MG per tablet Take 1 tablet by mouth daily.       Current Facility-Administered Medications on File Prior to Visit  Medication Dose Route Frequency Provider Last Rate Last Dose  . heparin lock flush 100 unit/mL  500 Units Intravenous Once Deatra Robinson, MD      . sodium chloride 0.9 % injection 10 mL  10 mL Intravenous PRN Deatra Robinson, MD   10 mL at 03/22/12 1243  . sodium chloride 0.9 % injection 10 mL  10 mL Intravenous PRN Deatra Robinson, MD       Family History  Problem Relation Age of Onset  . Pancreatic cancer Mother    History   Social History  . Marital Status: Single    Spouse Name: N/A    Number of Children: N/A  . Years of Education: N/A  Occupational History  . Not on file.   Social History Main Topics  . Smoking status: Never Smoker   . Smokeless tobacco: Never Used  . Alcohol Use: Yes     Comment: socially  . Drug Use: No  . Sexual Activity: Not Currently    Partners: Male     Comment: pt. declined condoms   Other Topics Concern  . Not on file   Social History Narrative   Single.    Review of Systems  HENT: Negative.   Eyes: Negative.   Respiratory: Negative.   Cardiovascular: Positive for leg swelling (chronic d/t kaposi sarcoma).  Neurological: Negative.       Objective:   Filed Vitals:   12/26/13 1715  BP: 116/80  Pulse: 73  Temp: 98.8 F (37.1 C)  Resp: 18   Physical Exam  Constitutional: He is oriented to person, place, and time and well-developed, well-nourished, and in no distress.  Eyes: Pupils are equal, round, and  reactive to light.  Neck: Normal range of motion. Neck supple.  Cardiovascular: Normal rate, regular rhythm and normal heart sounds.   Pulmonary/Chest: Effort normal and breath sounds normal.  Abdominal: Soft. Bowel sounds are normal.  Musculoskeletal: He exhibits edema (2+ BLE). He exhibits no tenderness.  Neurological: He is alert and oriented to person, place, and time.  Skin: Skin is warm and dry. Laceration (left middle finger) noted. He is not diaphoretic.     Lab Results  Component Value Date   WBC 6.2 10/28/2013   HGB 16.3 10/28/2013   HCT 51.5* 10/28/2013   MCV 80.3 10/28/2013   PLT 170 10/28/2013   Lab Results  Component Value Date   CREATININE 1.5* 10/28/2013   BUN 12.9 10/28/2013   NA 144 10/28/2013   K 3.3* 10/28/2013   CL 98 07/10/2013   CO2 30* 10/28/2013    No results found for this basename: HGBA1C   Lipid Panel     Component Value Date/Time   CHOL 149 07/10/2013 1104   TRIG 92 07/10/2013 1104   HDL 42 07/10/2013 1104   CHOLHDL 3.5 07/10/2013 1104   VLDL 18 07/10/2013 1104   LDLCALC 89 07/10/2013 1104       Assessment and plan:   Abie was seen today for animal bite and medication refill.  Diagnoses and associated orders for this visit:  Unspecified essential hypertension Continue current treament - amLODipine (NORVASC) 10 MG tablet; Take 1 tablet (10 mg total) by mouth daily. - hydrochlorothiazide (HYDRODIURIL) 25 MG tablet; Take 1 tablet (25 mg total) by mouth daily.  Dog bite of finger, initial encounter - Tdap vaccine greater than or equal to 7yo IM - doxycycline (VIBRA-TABS) 100 MG tablet; Take 1 tablet (100 mg total) by mouth 2 (two) times daily. Consulted with Dr. Doreene Burke, patient given antibiotics and advised to monitor dog over next ten days.  If behavior of dog is bizarre or if dog passes away he is to report to ER immediately for rabies treatment. Graves disease - Ambulatory referral to Endocrinology.  Advised patient to ask for different  provider at same endocrinologist office before switching offices.    Return in about 3 months (around 03/28/2014) for Hypertension.     Chari Manning, [facility] [phone] 12/30/2013, 5:36 PM

## 2014-01-02 ENCOUNTER — Telehealth: Payer: Self-pay | Admitting: *Deleted

## 2014-01-02 NOTE — Telephone Encounter (Signed)
Colletta Maryland returning call received from [facility] nurse.  Patient can go through the [facility] at on 01-22-2014 and have a cbc and CMP drawn.  If any further help needed please call Colletta Maryland at [phone].

## 2014-01-02 NOTE — Telephone Encounter (Signed)
Patient has new patient appt on 01/22/2014 with Dr Angelina Ok at [facility].  Called and left voicemail requesting that labs be drawn from [facility] to avoid duplication of labs here, and patient can have PAC flushed as well. Communicated to patient via Scientist, research (medical).

## 2014-01-06 ENCOUNTER — Ambulatory Visit: Payer: Medicare Other | Admitting: Adult Health

## 2014-01-06 ENCOUNTER — Other Ambulatory Visit: Payer: Medicare Other

## 2014-01-06 NOTE — Telephone Encounter (Signed)
Called and spoke with patient confirming current plan to return to our office 02/05/2014. He is to see Dr Marda Stalker at [facility] for second opinion on 01/22/2014. Establish with new Endocrinologist next week, then see ID Dr Johnnye Sima on 01/29/2014. This will give all participating physicians a chance to collaborate care for patient and potential treatment if needed. Patient verbalized understanding and will call if he needs [place] before this appt.

## 2014-01-07 ENCOUNTER — Telehealth: Payer: Self-pay | Admitting: Adult Health

## 2014-01-07 NOTE — Telephone Encounter (Signed)
, °

## 2014-01-08 ENCOUNTER — Other Ambulatory Visit: Payer: Self-pay | Admitting: Hematology and Oncology

## 2014-01-13 ENCOUNTER — Ambulatory Visit (INDEPENDENT_AMBULATORY_CARE_PROVIDER_SITE_OTHER): Payer: Medicare Other | Admitting: Endocrinology

## 2014-01-13 ENCOUNTER — Encounter: Payer: Self-pay | Admitting: Endocrinology

## 2014-01-13 VITALS — BP 124/82 | HR 75 | Temp 98.6°F | Resp 16 | Ht 73.25 in | Wt 271.2 lb

## 2014-01-13 DIAGNOSIS — E0500 Thyrotoxicosis with diffuse goiter without thyrotoxic crisis or storm: Secondary | ICD-10-CM | POA: Diagnosis not present

## 2014-01-13 DIAGNOSIS — E890 Postprocedural hypothyroidism: Secondary | ICD-10-CM | POA: Diagnosis not present

## 2014-01-13 LAB — T4, FREE: Free T4: 1.34 ng/dL (ref 0.60–1.60)

## 2014-01-13 LAB — TSH: TSH: 0.04 u[IU]/mL — AB (ref 0.35–4.50)

## 2014-01-13 NOTE — Progress Notes (Signed)
Quick Note:  Thyroid level is not high but need to add a free T3 level before deciding treatment ______

## 2014-01-13 NOTE — Progress Notes (Addendum)
Patient ID: Steven [name], male   DOB: 1981/04/19, 33 y.o.   MRN: [id number]   Reason for Appointment:  Hypothyroidism, new visit    History of Present Illness:   The Hyothyroidism was first diagnosed in ? Early 2013 He previously had hyperthyroidism which was treated with radioactive iodine in 05/2011 Records are not available but apparently was told that he became hypothyroid 2-3 months later He had been taking Synthroid 200 mcg daily till about 6 months ago and it was then reduced to 175 mcg  He thinks that after changing the dose he was not as tired and also had less symptoms of shakiness, heart fluttering and heat intolerance although not clear if he remembers accurately His weight fluctuates and has had no consistent trend No symptoms of cold intolerance, numbness of his hands, constipation or muscle cramps  He is quite compliant with taking his medication in the morning before breakfast and has not missed any doses recently. He does take Centrum multivitamin after breakfast He is now here for establishing as a new patient  Lab Results  Component Value Date   FREET4 2.52* 03/28/2011   TSH 0.009* 03/28/2011      Past Medical History  Diagnosis Date  . HIV positive 03/23/09    Genotype Y181C  . HTN (hypertension)   . Syphilis 03/23/09-    1:2  . Kaposi's sarcoma   . Cancer   . Asthma     No past surgical history on file.  Family History  Problem Relation Age of Onset  . Pancreatic cancer Mother     Social History:  reports that he has never smoked. He has never used smokeless tobacco. He reports that he drinks alcohol. He reports that he does not use illicit drugs.  Allergies:  Allergies  Allergen Reactions  . Lisinopril Swelling    Swelling of lower lip while on lisinopril  . Penicillins Hives and Swelling    REACTION: rash and swelling  . Aspirin Nausea Only      Medication List       This list is accurate as of: 01/13/14  9:36 AM.  Always use your most  recent med list.               Abacavir-Dolutegravir-Lamivud 600-50-300 MG Tabs  Take 1 tablet by mouth daily.     acetaminophen 500 MG tablet  Commonly known as:  TYLENOL  Take 500 mg by mouth every 6 (six) hours as needed. Pain or fever     albuterol 108 (90 BASE) MCG/ACT inhaler  Commonly known as:  PROVENTIL HFA;VENTOLIN HFA  Inhale 2 puffs into the lungs every 4 (four) hours as needed for wheezing or shortness of breath.     amLODipine 10 MG tablet  Commonly known as:  NORVASC  Take 1 tablet (10 mg total) by mouth daily.     doxycycline 100 MG tablet  Commonly known as:  VIBRA-TABS  Take 1 tablet (100 mg total) by mouth 2 (two) times daily.     fexofenadine 180 MG tablet  Commonly known as:  ALLEGRA  Take 1 tablet (180 mg total) by mouth daily.     fluticasone-salmeterol 115-21 MCG/ACT inhaler  Commonly known as:  ADVAIR HFA  Inhale 2 puffs into the lungs 2 (two) times daily.     hydrochlorothiazide 25 MG tablet  Commonly known as:  HYDRODIURIL  Take 1 tablet (25 mg total) by mouth daily.     levothyroxine 200 MCG tablet  Commonly  known as:  SYNTHROID, LEVOTHROID  Take 200 mcg by mouth daily before breakfast. DAW     potassium chloride SA 20 MEQ tablet  Commonly known as:  K-DUR,KLOR-CON  TAKE 1 TABLET BY MOUTH DAILY     sulfamethoxazole-trimethoprim 800-160 MG per tablet  Commonly known as:  BACTRIM DS        Review of Systems:  His eyes get irritated and dry especially in the mornings and he uses Visine. He thinks he has allergies also. He has had some prominence of his eyes but does not think this has changed  CARDIOLOGY: no history of high blood pressure.             He is being treated for HIV ENDOCRINOLOGY:  no history of Diabetes.     Opinion left hand tingles when he wakes up in the morning Periodically gets cramps in his sides No swelling of his feet   Examination:    BP 124/82  Pulse 75  Temp(Src) 98.6 F (37 C)  Resp 16  Ht 6'  1.25" (1.861 m)  Wt 271 lb 3.2 oz (123.016 kg)  BMI 35.52 kg/m2  SpO2 94%   General Appearance: pleasant, has generalized obesity         Eyes:  mild proptosis especially left side, exophthalmometry measures 30 mm on the left and 28 on the right. No eyelid swelling. Has mild conjunctival erythema but no chemosis. Has mild lower leg for traction but no lid lag. The ocular movements are normal bilaterally     Neck: The thyroid is nonpalpable . There is no lymphadenopathy .    Cardiovascular: Normal apex and heart sounds, no murmur Respiratory:  Lungs clear Gastrointestinal: abdomen soft, no hepatosplenomegaly    Neurological: REFLEXES: at biceps are normal.     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

## 2014-01-13 NOTE — Patient Instructions (Addendum)
Dose to be determined  Artificial tear drops 2x daily

## 2014-01-14 ENCOUNTER — Ambulatory Visit: Payer: Medicare Other

## 2014-01-14 ENCOUNTER — Other Ambulatory Visit (HOSPITAL_COMMUNITY)
Admission: RE | Admit: 2014-01-14 | Discharge: 2014-01-14 | Disposition: A | Discharge status: Home / Self Care | Payer: Medicare Other | Source: Ambulatory Visit | Attending: Internal Medicine | Admitting: Internal Medicine

## 2014-01-14 ENCOUNTER — Other Ambulatory Visit: Payer: Medicare Other

## 2014-01-14 DIAGNOSIS — B20 Human immunodeficiency virus [HIV] disease: Secondary | ICD-10-CM

## 2014-01-14 DIAGNOSIS — Z113 Encounter for screening for infections with a predominantly sexual mode of transmission: Secondary | ICD-10-CM | POA: Diagnosis not present

## 2014-01-14 DIAGNOSIS — E0590 Thyrotoxicosis, unspecified without thyrotoxic crisis or storm: Secondary | ICD-10-CM

## 2014-01-14 LAB — CBC WITH DIFFERENTIAL/PLATELET
Basophils Absolute: 0 10*3/uL (ref 0.0–0.1)
Basophils Relative: 1 % (ref 0–1)
EOS PCT: 8 % — AB (ref 0–5)
Eosinophils Absolute: 0.3 10*3/uL (ref 0.0–0.7)
HEMATOCRIT: 48.7 % (ref 39.0–52.0)
Hemoglobin: 16.6 g/dL (ref 13.0–17.0)
LYMPHS ABS: 1.6 10*3/uL (ref 0.7–4.0)
Lymphocytes Relative: 40 % (ref 12–46)
MCH: 26.1 pg (ref 26.0–34.0)
MCHC: 34.1 g/dL (ref 30.0–36.0)
MCV: 76.5 fL — ABNORMAL LOW (ref 78.0–100.0)
MONO ABS: 0.2 10*3/uL (ref 0.1–1.0)
MONOS PCT: 6 % (ref 3–12)
Neutro Abs: 1.8 10*3/uL (ref 1.7–7.7)
Neutrophils Relative %: 45 % (ref 43–77)
PLATELETS: 173 10*3/uL (ref 150–400)
RBC: 6.37 MIL/uL — AB (ref 4.22–5.81)
RDW: 14.6 % (ref 11.5–15.5)
WBC: 4 10*3/uL (ref 4.0–10.5)

## 2014-01-14 LAB — COMPLETE METABOLIC PANEL WITH GFR
ALT: 37 U/L (ref 0–53)
AST: 28 U/L (ref 0–37)
Albumin: 4.3 g/dL (ref 3.5–5.2)
Alkaline Phosphatase: 54 U/L (ref 39–117)
BILIRUBIN TOTAL: 1.7 mg/dL — AB (ref 0.2–1.2)
BUN: 16 mg/dL (ref 6–23)
CHLORIDE: 101 meq/L (ref 96–112)
CO2: 29 meq/L (ref 19–32)
CREATININE: 1.53 mg/dL — AB (ref 0.50–1.35)
Calcium: 8.9 mg/dL (ref 8.4–10.5)
GFR, EST NON AFRICAN AMERICAN: 58 mL/min — AB
GFR, Est African American: 68 mL/min
GLUCOSE: 91 mg/dL (ref 70–99)
Potassium: 3.3 mEq/L — ABNORMAL LOW (ref 3.5–5.3)
Sodium: 142 mEq/L (ref 135–145)
Total Protein: 7.2 g/dL (ref 6.0–8.3)

## 2014-01-14 LAB — HEPATITIS B SURFACE ANTIBODY,QUALITATIVE: Hep B S Ab: NEGATIVE

## 2014-01-15 LAB — T3, FREE: T3, Free: 3.4 pg/mL (ref 2.3–4.2)

## 2014-01-15 LAB — T-HELPER CELL (CD4) - (RCID CLINIC ONLY)
CD4 % Helper T Cell: 16 % — ABNORMAL LOW (ref 33–55)
CD4 T CELL ABS: 310 /uL — AB (ref 400–2700)

## 2014-01-15 NOTE — Progress Notes (Signed)
Quick Note:  Please let patient know that the dose needs to be 150 instead of 175ug, f/u 2 months with lab ______

## 2014-01-16 ENCOUNTER — Other Ambulatory Visit: Payer: Self-pay | Admitting: *Deleted

## 2014-01-16 LAB — HIV-1 RNA QUANT-NO REFLEX-BLD

## 2014-01-16 MED ORDER — LEVOTHYROXINE SODIUM 175 MCG PO TABS: 175.0000 ug | ORAL_TABLET | Freq: Every day | ORAL | Status: DC

## 2014-01-20 ENCOUNTER — Other Ambulatory Visit: Payer: Self-pay | Admitting: *Deleted

## 2014-01-20 DIAGNOSIS — B20 Human immunodeficiency virus [HIV] disease: Secondary | ICD-10-CM

## 2014-01-20 MED ORDER — ABACAVIR-DOLUTEGRAVIR-LAMIVUD 600-50-300 MG PO TABS: 1.0000 | ORAL_TABLET | Freq: Every day | ORAL | Status: DC

## 2014-01-22 DIAGNOSIS — C467 Kaposi's sarcoma of other sites: Secondary | ICD-10-CM | POA: Diagnosis not present

## 2014-01-22 DIAGNOSIS — C469 Kaposi's sarcoma, unspecified: Secondary | ICD-10-CM | POA: Diagnosis not present

## 2014-01-22 DIAGNOSIS — B9735 Human immunodeficiency virus, type 2 [HIV 2] as the cause of diseases classified elsewhere: Secondary | ICD-10-CM | POA: Diagnosis not present

## 2014-01-22 DIAGNOSIS — B20 Human immunodeficiency virus [HIV] disease: Secondary | ICD-10-CM | POA: Diagnosis not present

## 2014-01-29 ENCOUNTER — Ambulatory Visit: Payer: Medicare Other | Admitting: Infectious Diseases

## 2014-02-05 ENCOUNTER — Telehealth: Payer: Self-pay | Admitting: *Deleted

## 2014-02-05 ENCOUNTER — Ambulatory Visit (HOSPITAL_BASED_OUTPATIENT_CLINIC_OR_DEPARTMENT_OTHER): Payer: Medicare Other | Admitting: Adult Health

## 2014-02-05 ENCOUNTER — Encounter: Payer: Self-pay | Admitting: Infectious Diseases

## 2014-02-05 ENCOUNTER — Ambulatory Visit (INDEPENDENT_AMBULATORY_CARE_PROVIDER_SITE_OTHER): Payer: Medicare Other | Admitting: Infectious Diseases

## 2014-02-05 ENCOUNTER — Encounter: Payer: Self-pay | Admitting: Adult Health

## 2014-02-05 ENCOUNTER — Telehealth: Payer: Self-pay | Admitting: Adult Health

## 2014-02-05 VITALS — Temp 98.2°F | Ht 73.0 in | Wt 271.2 lb

## 2014-02-05 VITALS — BP 119/74 | HR 75 | Temp 98.9°F | Resp 18 | Ht 73.0 in | Wt 270.2 lb

## 2014-02-05 DIAGNOSIS — C469 Kaposi's sarcoma, unspecified: Secondary | ICD-10-CM

## 2014-02-05 DIAGNOSIS — Z79899 Other long term (current) drug therapy: Secondary | ICD-10-CM

## 2014-02-05 DIAGNOSIS — Z113 Encounter for screening for infections with a predominantly sexual mode of transmission: Secondary | ICD-10-CM | POA: Diagnosis not present

## 2014-02-05 DIAGNOSIS — Z23 Encounter for immunization: Secondary | ICD-10-CM

## 2014-02-05 DIAGNOSIS — R0982 Postnasal drip: Secondary | ICD-10-CM

## 2014-02-05 DIAGNOSIS — I890 Lymphedema, not elsewhere classified: Secondary | ICD-10-CM | POA: Diagnosis not present

## 2014-02-05 DIAGNOSIS — R229 Localized swelling, mass and lump, unspecified: Secondary | ICD-10-CM

## 2014-02-05 DIAGNOSIS — IMO0002 Reserved for concepts with insufficient information to code with codable children: Secondary | ICD-10-CM

## 2014-02-05 DIAGNOSIS — C7951 Secondary malignant neoplasm of bone: Secondary | ICD-10-CM

## 2014-02-05 DIAGNOSIS — B20 Human immunodeficiency virus [HIV] disease: Secondary | ICD-10-CM | POA: Diagnosis not present

## 2014-02-05 DIAGNOSIS — C7952 Secondary malignant neoplasm of bone marrow: Secondary | ICD-10-CM

## 2014-02-05 DIAGNOSIS — N289 Disorder of kidney and ureter, unspecified: Secondary | ICD-10-CM

## 2014-02-05 NOTE — Assessment & Plan Note (Signed)
Greatly appreciate onc f/u.

## 2014-02-05 NOTE — Assessment & Plan Note (Signed)
Is long standing. Advised him to take otc zyrtec or claritin or allegra. Add flonase otc for 1 week, bid as well. Will get him into allergy.

## 2014-02-05 NOTE — Progress Notes (Signed)
   Subjective:    Patient ID: Steven [name], male    DOB: February 23, 1981, 34 y.o.   MRN: [id number]  HPI 33 yo M with hx of AIDS, KS. He was changed from TRV/ISN in 2014 to triumeq. Has had no problems with ART.  He is being monitored for his KS by Onc.   HIV 1 RNA Quant (copies/mL)  Date Value  01/14/2014 <20   07/10/2013 <20   04/09/2013 <20      CD4 T Cell Abs (/uL)  Date Value  01/14/2014 310*  07/10/2013 190*  04/09/2013 200*   Having touble with sinus d/c. Clear drainage. No headaches. No f/c. Has tried zyrtec, allegra, tylenol sinus. Only transient results.   Review of Systems  Constitutional: Negative for fever, chills, appetite change and unexpected weight change.  HENT: Positive for postnasal drip. Negative for sinus pressure.   Gastrointestinal: Negative for diarrhea and constipation.  Genitourinary: Negative for difficulty urinating.  Neurological: Negative for headaches.       Objective:   Physical Exam  Constitutional: He appears well-developed and well-nourished.  HENT:  Mouth/Throat: No oropharyngeal exudate.  Eyes: EOM are normal. Pupils are equal, round, and reactive to light.  Neck: Neck supple.  Cardiovascular: Normal rate, regular rhythm and normal heart sounds.   Pulmonary/Chest: Effort normal and breath sounds normal.  Abdominal: Soft. Bowel sounds are normal. He exhibits no distension. There is no tenderness.  Lymphadenopathy:    He has no cervical adenopathy.          Assessment & Plan:

## 2014-02-05 NOTE — Patient Instructions (Signed)
Insomnia Insomnia is frequent trouble falling and/or staying asleep. Insomnia can be a long term problem or a short term problem. Both are common. Insomnia can be a short term problem when the wakefulness is related to a certain stress or worry. Long term insomnia is often related to ongoing stress during waking hours and/or poor sleeping habits. Overtime, sleep deprivation itself can make the problem worse. Every little thing feels more severe because you are overtired and your ability to cope is decreased. CAUSES   Stress, anxiety, and depression.  Poor sleeping habits.  Distractions such as TV in the bedroom.  Naps close to bedtime.  Engaging in emotionally charged conversations before bed.  Technical reading before sleep.  Alcohol and other sedatives. They may make the problem worse. They can hurt normal sleep patterns and normal dream activity.  Stimulants such as caffeine for several hours prior to bedtime.  Pain syndromes and shortness of breath can cause insomnia.  Exercise late at night.  Changing time zones may cause sleeping problems (jet lag). It is sometimes helpful to have someone observe your sleeping patterns. They should look for periods of not breathing during the night (sleep apnea). They should also look to see how long those periods last. If you live alone or observers are uncertain, you can also be observed at a sleep clinic where your sleep patterns will be professionally monitored. Sleep apnea requires a checkup and treatment. Give your caregivers your medical history. Give your caregivers observations your family has made about your sleep.  SYMPTOMS   Not feeling rested in the morning.  Anxiety and restlessness at bedtime.  Difficulty falling and staying asleep. TREATMENT   Your caregiver may prescribe treatment for an underlying medical disorders. Your caregiver can give advice or help if you are using alcohol or other drugs for self-medication. Treatment  of underlying problems will usually eliminate insomnia problems.  Medications can be prescribed for short time use. They are generally not recommended for lengthy use.  Over-the-counter sleep medicines are not recommended for lengthy use. They can be habit forming.  You can promote easier sleeping by making lifestyle changes such as:  Using relaxation techniques that help with breathing and reduce muscle tension.  Exercising earlier in the day.  Changing your diet and the time of your last meal. No night time snacks.  Establish a regular time to go to bed.  Counseling can help with stressful problems and worry.  Soothing music and white noise may be helpful if there are background noises you cannot remove.  Stop tedious detailed work at least one hour before bedtime. HOME CARE INSTRUCTIONS   Keep a diary. Inform your caregiver about your progress. This includes any medication side effects. See your caregiver regularly. Take note of:  Times when you are asleep.  Times when you are awake during the night.  The quality of your sleep.  How you feel the next day. This information will help your caregiver care for you.  Get out of bed if you are still awake after 15 minutes. Read or do some quiet activity. Keep the lights down. Wait until you feel sleepy and go back to bed.  Keep regular sleeping and waking hours. Avoid naps.  Exercise regularly.  Avoid distractions at bedtime. Distractions include watching television or engaging in any intense or detailed activity like attempting to balance the household checkbook.  Develop a bedtime ritual. Keep a familiar routine of bathing, brushing your teeth, climbing into bed at the same   time each night, listening to soothing music. Routines increase the success of falling to sleep faster.  Use relaxation techniques. This can be using breathing and muscle tension release routines. It can also include visualizing peaceful scenes. You can  also help control troubling or intruding thoughts by keeping your mind occupied with boring or repetitive thoughts like the old concept of counting sheep. You can make it more creative like imagining planting one beautiful flower after another in your backyard garden.  During your day, work to eliminate stress. When this is not possible use some of the previous suggestions to help reduce the anxiety that accompanies stressful situations. MAKE SURE YOU:   Understand these instructions.  Will watch your condition.  Will get help right away if you are not doing well or get worse. Document Released: 06/24/2000 Document Revised: 09/19/2011 Document Reviewed: 07/25/2007 Exit[employer] Patient Information 2015 [employer]. This information is not intended to replace advice given to you by your health care provider. Make sure you discuss any questions you have with your health care provider.  

## 2014-02-05 NOTE — Telephone Encounter (Signed)
, °

## 2014-02-05 NOTE — Progress Notes (Signed)
OFFICE PROGRESS NOTE  CC Dr. 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  #2 bilateral subdural hematoma in August 2011 status post evacuation.  #3 HIV disease on highly active antiretroviral therapy   PRIOR THERAPY: 1.  Patient was diagnosed with HIV/AIDS in 2010 with multiple lytic bone lesions.   2. He underwent a sacral biopsy on 03/23/2009 revealing spindle cell proliferation that was c/w Kaposi Sarcoma 3. He was treated with Paclitaxel and Liposomal Doxorubicin for an unknown duration.   4. He entered surveillance after a spontaneous subdural hematoma (per the patient). 5. CT chest abdomen and pelvis on 10/28/2013 demonstrated stable lytic bone lesions, stable small bowel mesteric lymph nodes, and a new anterior mediastinal mass that was 2.4 b 4.8 cm (thymic versus nodal).  He underwent PET on 12/06/13 that demonstrated low level activity in the anterior mediastinum SUV 3, and increased activit in the mesenteric lymph nodes SUV 6.    INTERVAL HISTORY: Steven [name] 33 y.o. male returns for Followup of his KS.  He is doing well today.  He was recently evaluated by Dr. Angelina Ok at our request for his opinion of his recent PET/CT.  He complains of occasional difficulty sleeping.  This has been happening only this past month.  He does have swelling in his legs and ankles that has been attributed to his swollen lymph nodes.  He has been recommended in the past by both Dr. Humphrey Rolls and Dr. Angelina Ok to undergo PT for this.  Otherwise he denies fevers, chills, night sweats, new skin changes, or any further concerns.  He had f/u with his ID physician today and is taking his HAART therapy as prescribed.  MEDICAL HISTORY: Past Medical History  Diagnosis Date  . HIV positive 03/23/09    Genotype [id number]  . HTN (hypertension)   . Syphilis 03/23/09-     1:2  . Kaposi's sarcoma   . Cancer   . Asthma     ALLERGIES:  is allergic to lisinopril; penicillins; and aspirin.  MEDICATIONS:  Current Outpatient Prescriptions  Medication Sig Dispense Refill  . Abacavir-Dolutegravir-Lamivud 600-50-300 MG TABS Take 1 tablet by mouth daily.  30 tablet  6  . albuterol (PROVENTIL HFA;VENTOLIN HFA) 108 (90 BASE) MCG/ACT inhaler Inhale 2 puffs into the lungs every 4 (four) hours as needed for wheezing or shortness of breath.  1 Inhaler  3  . amLODipine (NORVASC) 10 MG tablet Take 1 tablet (10 mg total) by mouth daily.  90 tablet  3  . fexofenadine (ALLEGRA) 180 MG tablet Take 1 tablet (180 mg total) by mouth daily.  15 tablet  0  . fluticasone-salmeterol (ADVAIR HFA) 115-21 MCG/ACT inhaler Inhale 2 puffs into the lungs 2 (two) times daily.  1 Inhaler  12  . hydrochlorothiazide (HYDRODIURIL) 25 MG tablet Take 1 tablet (25 mg total) by mouth daily.  90 tablet  3  . levothyroxine (SYNTHROID) 175 MCG tablet Take 1 tablet (175 mcg total) by mouth daily before breakfast.  30 tablet  5  . Multiple Vitamin (MULTIVITAMIN) tablet Take 1 tablet by mouth daily.      . potassium chloride SA (K-DUR,KLOR-CON) 20 MEQ tablet TAKE 1 TABLET BY MOUTH DAILY  20 tablet  0  . sulfamethoxazole-trimethoprim (BACTRIM DS) 800-160 MG per tablet       . acetaminophen (TYLENOL) 500 MG tablet Take 500 mg by mouth every 6 (six) hours  as needed. Pain or fever      . [DISCONTINUED] carvedilol (COREG) 12.5 MG tablet Take 1 tablet (12.5 mg total) by mouth 2 (two) times daily with a meal.  30 tablet  0  . [DISCONTINUED] lisinopril-hydrochlorothiazide (PRINZIDE,ZESTORETIC) 20-12.5 MG per tablet Take 1 tablet by mouth daily.       No current facility-administered medications for this visit.   Facility-Administered Medications Ordered in Other Visits  Medication Dose Route Frequency Provider Last Rate Last Dose  . heparin lock flush 100 unit/mL  500 Units Intravenous Once Deatra Robinson, MD       . sodium chloride 0.9 % injection 10 mL  10 mL Intravenous PRN Deatra Robinson, MD   10 mL at 03/22/12 1243  . sodium chloride 0.9 % injection 10 mL  10 mL Intravenous PRN Deatra Robinson, MD        SURGICAL HISTORY: No past surgical history on file.  PHYSICAL EXAMINATION: BP 119/74  Pulse 75  Temp(Src) 98.9 F (37.2 C) (Oral)  Resp 18  Ht 6\' 1"  (1.854 m)  Wt 270 lb 3.2 oz (122.562 kg)  BMI 35.66 kg/m2 GENERAL: Patient is a well appearing male in no acute distress HEENT:  Sclerae anicteric.  Oropharynx clear and moist. No ulcerations or evidence of oropharyngeal candidiasis. Neck is supple.  NODES:  No cervical, supraclavicular, or axillary lymphadenopathy palpated.  LUNGS:  Clear to auscultation bilaterally.  No wheezes or rhonchi. HEART:  Regular rate and rhythm. No murmur appreciated. ABDOMEN:  Soft, nontender.  Positive, normoactive bowel sounds. No organomegaly palpated. MSK:  No focal spinal tenderness to palpation. Full range of motion bilaterally in the upper extremities. EXTREMITIES:  Bilateral lower extremity edema, non pitting SKIN:  Clear with no obvious rashes or skin changes. No nail dyscrasia. NEURO:  Nonfocal. Well oriented.  Appropriate affect. ECOG PERFORMANCE STATUS: 0 - Asymptomatic   LABORATORY DATA: Lab Results  Component Value Date   WBC 4.0 01/14/2014   HGB 16.6 01/14/2014   HCT 48.7 01/14/2014   MCV 76.5* 01/14/2014   PLT 173 01/14/2014      Chemistry      Component Value Date/Time   NA 142 01/14/2014 1011   NA 144 10/28/2013 1612   NA 133 05/04/2010 1254   K 3.3* 01/14/2014 1011   K 3.3* 10/28/2013 1612   K 4.1 05/04/2010 1254   CL 101 01/14/2014 1011   CL 101 09/20/2012 1202   CL 100 05/04/2010 1254   CO2 29 01/14/2014 1011   CO2 30* 10/28/2013 1612   CO2 29 05/04/2010 1254   BUN 16 01/14/2014 1011   BUN 12.9 10/28/2013 1612   BUN 12 05/04/2010 1254   CREATININE 1.53* 01/14/2014 1011   CREATININE 1.5* 10/28/2013 1612   CREATININE 1.31 02/08/2012 0316       Component Value Date/Time   CALCIUM 8.9 01/14/2014 1011   CALCIUM 9.6 10/28/2013 1612   CALCIUM 8.9 05/04/2010 1254   ALKPHOS 54 01/14/2014 1011   ALKPHOS 73 10/28/2013 1612   ALKPHOS 110* 05/04/2010 1254   AST 28 01/14/2014 1011   AST 32 10/28/2013 1612   AST 23 05/04/2010 1254   ALT 37 01/14/2014 1011   ALT 44 10/28/2013 1612   ALT 12 05/04/2010 1254   BILITOT 1.7* 01/14/2014 1011   BILITOT 1.36* 10/28/2013 1612   BILITOT 1.10 05/04/2010 1254        ASSESSMENT:  33 year old male with 1.  Patient was diagnosed with HIV/AIDS in 2010  with multiple lytic bone lesions.   2. He underwent a sacral biopsy on 03/23/2009 revealing spindle cell proliferation that was c/w Kaposi Sarcoma 3. He was treated with Paclitaxel and Liposomal Doxorubicin for an unknown duration.   4. He entered surveillance after a spontaneous subdural hematoma (per the patient). 5. CT chest abdomen and pelvis on 10/28/2013 demonstrated stable lytic bone lesions, stable small bowel mesteric lymph nodes, and a new anterior mediastinal mass that was 2.4 b 4.8 cm (thymic versus nodal).  He underwent PET on 12/06/13 that demonstrated low level activity in the anterior mediastinum SUV 3, and increased activity in the mesenteric lymph nodes SUV 6.    PLAN: Steven [name] is doing well today.  I did refer him to PT for his lower extremity lymphedema.  We will continue to monitor him closely for any recurrence as per the recommendation by Dr. Angelina Ok at [facility].  I reviewed his case with Dr. Benay Spice.  The patient will undergo repeat PET/CT in early September and will establish care with Dr. Benay Spice following his PET/CT scan.  I have ordered these and discussed this with the patient over the phone.  He is in agreement with the above plan.     He knows to call us in the interim for any questions or concerns.  We can certainly see him sooner if needed.   I spent  25 minutes counseling the patient face to face. The total time spent in the appointment was 30  minutes.   Minette Headland, [place] [phone] 02/05/2014, 2:11 PM

## 2014-02-05 NOTE — Telephone Encounter (Addendum)
Patient notified about Allergy and Asthma referral.  Verbalized back information.  Appt w/ Dr. Neldon Mc, [place] [address] [place], [place].   [phone]. Tuesday., Aug. 18th @ 1:30 PM NO ANTIHISTAMINES FOR 3 DAYS PRIOR TO THE APPT.

## 2014-02-05 NOTE — Assessment & Plan Note (Signed)
He is doing very well. Will continue stribild, despite Cr increase. Offered/refuses condoms. Need to repeat Hep B.  pnvx next year.

## 2014-02-05 NOTE — Assessment & Plan Note (Signed)
Has been steady.

## 2014-02-14 ENCOUNTER — Telehealth: Payer: Self-pay | Admitting: Nurse Practitioner

## 2014-02-25 ENCOUNTER — Ambulatory Visit: Payer: Medicare Other | Admitting: Physical Therapy

## 2014-02-25 ENCOUNTER — Ambulatory Visit: Payer: Medicare Other | Attending: Hematology and Oncology | Admitting: Physical Therapy

## 2014-02-25 DIAGNOSIS — Z8673 Personal history of transient ischemic attack (TIA), and cerebral infarction without residual deficits: Secondary | ICD-10-CM | POA: Insufficient documentation

## 2014-02-25 DIAGNOSIS — M899 Disorder of bone, unspecified: Secondary | ICD-10-CM | POA: Diagnosis not present

## 2014-02-25 DIAGNOSIS — C469 Kaposi's sarcoma, unspecified: Secondary | ICD-10-CM | POA: Insufficient documentation

## 2014-02-25 DIAGNOSIS — M949 Disorder of cartilage, unspecified: Secondary | ICD-10-CM | POA: Diagnosis not present

## 2014-02-25 DIAGNOSIS — J45909 Unspecified asthma, uncomplicated: Secondary | ICD-10-CM | POA: Diagnosis not present

## 2014-02-25 DIAGNOSIS — B20 Human immunodeficiency virus [HIV] disease: Secondary | ICD-10-CM | POA: Diagnosis not present

## 2014-02-25 DIAGNOSIS — J309 Allergic rhinitis, unspecified: Secondary | ICD-10-CM | POA: Diagnosis not present

## 2014-02-25 DIAGNOSIS — I890 Lymphedema, not elsewhere classified: Secondary | ICD-10-CM | POA: Insufficient documentation

## 2014-02-25 DIAGNOSIS — IMO0001 Reserved for inherently not codable concepts without codable children: Secondary | ICD-10-CM | POA: Diagnosis not present

## 2014-03-03 ENCOUNTER — Ambulatory Visit: Payer: Medicare Other | Admitting: Infectious Diseases

## 2014-03-03 ENCOUNTER — Ambulatory Visit: Payer: Medicare Other | Admitting: Physical Therapy

## 2014-03-03 ENCOUNTER — Other Ambulatory Visit: Payer: Self-pay | Admitting: *Deleted

## 2014-03-03 DIAGNOSIS — C469 Kaposi's sarcoma, unspecified: Secondary | ICD-10-CM | POA: Diagnosis not present

## 2014-03-03 DIAGNOSIS — IMO0001 Reserved for inherently not codable concepts without codable children: Secondary | ICD-10-CM | POA: Diagnosis not present

## 2014-03-03 DIAGNOSIS — M949 Disorder of cartilage, unspecified: Secondary | ICD-10-CM | POA: Diagnosis not present

## 2014-03-03 DIAGNOSIS — Z8673 Personal history of transient ischemic attack (TIA), and cerebral infarction without residual deficits: Secondary | ICD-10-CM | POA: Diagnosis not present

## 2014-03-03 DIAGNOSIS — M899 Disorder of bone, unspecified: Secondary | ICD-10-CM | POA: Diagnosis not present

## 2014-03-03 DIAGNOSIS — I890 Lymphedema, not elsewhere classified: Secondary | ICD-10-CM | POA: Diagnosis not present

## 2014-03-03 DIAGNOSIS — B20 Human immunodeficiency virus [HIV] disease: Secondary | ICD-10-CM | POA: Diagnosis not present

## 2014-03-04 ENCOUNTER — Ambulatory Visit: Payer: Medicare Other | Admitting: Physical Therapy

## 2014-03-04 DIAGNOSIS — M899 Disorder of bone, unspecified: Secondary | ICD-10-CM | POA: Diagnosis not present

## 2014-03-04 DIAGNOSIS — Z8673 Personal history of transient ischemic attack (TIA), and cerebral infarction without residual deficits: Secondary | ICD-10-CM | POA: Diagnosis not present

## 2014-03-04 DIAGNOSIS — IMO0001 Reserved for inherently not codable concepts without codable children: Secondary | ICD-10-CM | POA: Diagnosis not present

## 2014-03-04 DIAGNOSIS — M949 Disorder of cartilage, unspecified: Secondary | ICD-10-CM | POA: Diagnosis not present

## 2014-03-04 DIAGNOSIS — C469 Kaposi's sarcoma, unspecified: Secondary | ICD-10-CM | POA: Diagnosis not present

## 2014-03-04 DIAGNOSIS — I890 Lymphedema, not elsewhere classified: Secondary | ICD-10-CM | POA: Diagnosis not present

## 2014-03-04 DIAGNOSIS — B20 Human immunodeficiency virus [HIV] disease: Secondary | ICD-10-CM | POA: Diagnosis not present

## 2014-03-04 MED ORDER — ALBUTEROL SULFATE HFA 108 (90 BASE) MCG/ACT IN AERS: 2.0000 | INHALATION_SPRAY | RESPIRATORY_TRACT | Status: DC | PRN

## 2014-03-05 ENCOUNTER — Encounter: Payer: Medicare Other | Admitting: Physical Therapy

## 2014-03-06 ENCOUNTER — Ambulatory Visit: Payer: Medicare Other | Admitting: Physical Therapy

## 2014-03-06 DIAGNOSIS — C469 Kaposi's sarcoma, unspecified: Secondary | ICD-10-CM | POA: Diagnosis not present

## 2014-03-06 DIAGNOSIS — M899 Disorder of bone, unspecified: Secondary | ICD-10-CM | POA: Diagnosis not present

## 2014-03-06 DIAGNOSIS — I890 Lymphedema, not elsewhere classified: Secondary | ICD-10-CM | POA: Diagnosis not present

## 2014-03-06 DIAGNOSIS — B20 Human immunodeficiency virus [HIV] disease: Secondary | ICD-10-CM | POA: Diagnosis not present

## 2014-03-06 DIAGNOSIS — Z8673 Personal history of transient ischemic attack (TIA), and cerebral infarction without residual deficits: Secondary | ICD-10-CM | POA: Diagnosis not present

## 2014-03-06 DIAGNOSIS — M949 Disorder of cartilage, unspecified: Secondary | ICD-10-CM | POA: Diagnosis not present

## 2014-03-06 DIAGNOSIS — IMO0001 Reserved for inherently not codable concepts without codable children: Secondary | ICD-10-CM | POA: Diagnosis not present

## 2014-03-10 ENCOUNTER — Ambulatory Visit: Payer: Medicare Other | Admitting: Physical Therapy

## 2014-03-10 DIAGNOSIS — IMO0001 Reserved for inherently not codable concepts without codable children: Secondary | ICD-10-CM | POA: Diagnosis not present

## 2014-03-10 DIAGNOSIS — I890 Lymphedema, not elsewhere classified: Secondary | ICD-10-CM | POA: Diagnosis not present

## 2014-03-10 DIAGNOSIS — Z8673 Personal history of transient ischemic attack (TIA), and cerebral infarction without residual deficits: Secondary | ICD-10-CM | POA: Diagnosis not present

## 2014-03-10 DIAGNOSIS — C469 Kaposi's sarcoma, unspecified: Secondary | ICD-10-CM | POA: Diagnosis not present

## 2014-03-10 DIAGNOSIS — M899 Disorder of bone, unspecified: Secondary | ICD-10-CM | POA: Diagnosis not present

## 2014-03-10 DIAGNOSIS — B20 Human immunodeficiency virus [HIV] disease: Secondary | ICD-10-CM | POA: Diagnosis not present

## 2014-03-12 ENCOUNTER — Ambulatory Visit: Payer: Medicare Other | Attending: Hematology and Oncology | Admitting: Physical Therapy

## 2014-03-12 DIAGNOSIS — B20 Human immunodeficiency virus [HIV] disease: Secondary | ICD-10-CM | POA: Diagnosis not present

## 2014-03-12 DIAGNOSIS — IMO0001 Reserved for inherently not codable concepts without codable children: Secondary | ICD-10-CM | POA: Diagnosis not present

## 2014-03-12 DIAGNOSIS — C469 Kaposi's sarcoma, unspecified: Secondary | ICD-10-CM | POA: Insufficient documentation

## 2014-03-12 DIAGNOSIS — I890 Lymphedema, not elsewhere classified: Secondary | ICD-10-CM | POA: Insufficient documentation

## 2014-03-13 ENCOUNTER — Encounter (HOSPITAL_COMMUNITY): Payer: Self-pay

## 2014-03-13 ENCOUNTER — Ambulatory Visit (HOSPITAL_BASED_OUTPATIENT_CLINIC_OR_DEPARTMENT_OTHER): Payer: Medicare Other

## 2014-03-13 ENCOUNTER — Ambulatory Visit (HOSPITAL_COMMUNITY): Payer: Medicare Other

## 2014-03-13 ENCOUNTER — Other Ambulatory Visit (HOSPITAL_COMMUNITY): Payer: Medicare Other

## 2014-03-13 ENCOUNTER — Other Ambulatory Visit (HOSPITAL_BASED_OUTPATIENT_CLINIC_OR_DEPARTMENT_OTHER): Payer: Medicare Other

## 2014-03-13 ENCOUNTER — Encounter (HOSPITAL_COMMUNITY)
Admission: RE | Admit: 2014-03-13 | Discharge: 2014-03-13 | Disposition: A | Discharge status: Home / Self Care | Payer: Medicare Other | Source: Ambulatory Visit | Attending: Adult Health | Admitting: Adult Health

## 2014-03-13 ENCOUNTER — Ambulatory Visit (HOSPITAL_COMMUNITY)
Admission: RE | Admit: 2014-03-13 | Discharge: 2014-03-13 | Disposition: A | Discharge status: Home / Self Care | Payer: Medicare Other | Source: Ambulatory Visit | Attending: Adult Health | Admitting: Adult Health

## 2014-03-13 DIAGNOSIS — C469 Kaposi's sarcoma, unspecified: Secondary | ICD-10-CM

## 2014-03-13 DIAGNOSIS — B20 Human immunodeficiency virus [HIV] disease: Secondary | ICD-10-CM | POA: Insufficient documentation

## 2014-03-13 DIAGNOSIS — R229 Localized swelling, mass and lump, unspecified: Secondary | ICD-10-CM

## 2014-03-13 DIAGNOSIS — C7952 Secondary malignant neoplasm of bone marrow: Secondary | ICD-10-CM

## 2014-03-13 DIAGNOSIS — Z452 Encounter for adjustment and management of vascular access device: Secondary | ICD-10-CM

## 2014-03-13 DIAGNOSIS — IMO0002 Reserved for concepts with insufficient information to code with codable children: Secondary | ICD-10-CM

## 2014-03-13 DIAGNOSIS — R222 Localized swelling, mass and lump, trunk: Secondary | ICD-10-CM | POA: Diagnosis not present

## 2014-03-13 DIAGNOSIS — C7951 Secondary malignant neoplasm of bone: Secondary | ICD-10-CM | POA: Diagnosis not present

## 2014-03-13 DIAGNOSIS — Z95828 Presence of other vascular implants and grafts: Secondary | ICD-10-CM

## 2014-03-13 LAB — CBC WITH DIFFERENTIAL/PLATELET
BASO%: 0.7 % (ref 0.0–2.0)
Basophils Absolute: 0 10*3/uL (ref 0.0–0.1)
EOS%: 7.2 % — ABNORMAL HIGH (ref 0.0–7.0)
Eosinophils Absolute: 0.5 10*3/uL (ref 0.0–0.5)
HCT: 55.3 % — ABNORMAL HIGH (ref 38.4–49.9)
HGB: 17.2 g/dL — ABNORMAL HIGH (ref 13.0–17.1)
LYMPH%: 23.7 % (ref 14.0–49.0)
MCH: 25.4 pg — ABNORMAL LOW (ref 27.2–33.4)
MCHC: 31.1 g/dL — AB (ref 32.0–36.0)
MCV: 81.9 fL (ref 79.3–98.0)
MONO#: 0.5 10*3/uL (ref 0.1–0.9)
MONO%: 7.7 % (ref 0.0–14.0)
NEUT#: 4 10*3/uL (ref 1.5–6.5)
NEUT%: 60.7 % (ref 39.0–75.0)
PLATELETS: 164 10*3/uL (ref 140–400)
RBC: 6.76 10*6/uL — AB (ref 4.20–5.82)
RDW: 14.3 % (ref 11.0–14.6)
WBC: 6.5 10*3/uL (ref 4.0–10.3)
lymph#: 1.5 10*3/uL (ref 0.9–3.3)

## 2014-03-13 LAB — COMPREHENSIVE METABOLIC PANEL (CC13)
ALK PHOS: 66 U/L (ref 40–150)
ALT: 26 U/L (ref 0–55)
ANION GAP: 10 meq/L (ref 3–11)
AST: 26 U/L (ref 5–34)
Albumin: 4.4 g/dL (ref 3.5–5.0)
BILIRUBIN TOTAL: 2.22 mg/dL — AB (ref 0.20–1.20)
BUN: 14.3 mg/dL (ref 7.0–26.0)
CO2: 29 mEq/L (ref 22–29)
Calcium: 9.5 mg/dL (ref 8.4–10.4)
Chloride: 104 mEq/L (ref 98–109)
Creatinine: 1.6 mg/dL — ABNORMAL HIGH (ref 0.7–1.3)
GLUCOSE: 96 mg/dL (ref 70–140)
Potassium: 3.6 mEq/L (ref 3.5–5.1)
Sodium: 142 mEq/L (ref 136–145)
Total Protein: 8 g/dL (ref 6.4–8.3)

## 2014-03-13 LAB — GLUCOSE, CAPILLARY: GLUCOSE-CAPILLARY: 97 mg/dL (ref 70–99)

## 2014-03-13 MED ORDER — SODIUM CHLORIDE 0.9 % IJ SOLN
10.0000 mL | INTRAMUSCULAR | Status: DC | PRN
  Administered 2014-03-13: 10 mL via INTRAVENOUS
  Filled 2014-03-13: qty 10

## 2014-03-13 MED ORDER — IOHEXOL 300 MG/ML  SOLN
100.0000 mL | Freq: Once | INTRAMUSCULAR | Status: AC | PRN
  Administered 2014-03-13: 100 mL via INTRAVENOUS

## 2014-03-13 MED ORDER — FLUDEOXYGLUCOSE F - 18 (FDG) INJECTION
13.5000 | Freq: Once | INTRAVENOUS | Status: AC | PRN
  Administered 2014-03-13: 13.5 via INTRAVENOUS

## 2014-03-13 MED ORDER — HEPARIN SOD (PORK) LOCK FLUSH 100 UNIT/ML IV SOLN
500.0000 [IU] | Freq: Once | INTRAVENOUS | Status: AC
  Administered 2014-03-13: 500 [IU] via INTRAVENOUS
  Filled 2014-03-13: qty 5

## 2014-03-13 NOTE — Progress Notes (Signed)
Pt accessed via PAC for labs and CT. Pt denies any complaints about port, flushes well, no blood return noted. Patient states it has been approx 2 months since last flush. Patient was flushed with saline and heparin, informed Ned Card, NP. Pt sent back to phlebotomist for lab draw from arm and left accessed for CT if able to use. Pt verbalized understanding.

## 2014-03-13 NOTE — Patient Instructions (Signed)

## 2014-03-14 ENCOUNTER — Ambulatory Visit: Payer: Medicare Other | Admitting: Physical Therapy

## 2014-03-14 DIAGNOSIS — J45909 Unspecified asthma, uncomplicated: Secondary | ICD-10-CM | POA: Diagnosis not present

## 2014-03-14 DIAGNOSIS — J309 Allergic rhinitis, unspecified: Secondary | ICD-10-CM | POA: Diagnosis not present

## 2014-03-18 ENCOUNTER — Telehealth: Payer: Self-pay | Admitting: Oncology

## 2014-03-18 ENCOUNTER — Ambulatory Visit: Payer: Medicare Other | Admitting: Physical Therapy

## 2014-03-18 ENCOUNTER — Ambulatory Visit (HOSPITAL_BASED_OUTPATIENT_CLINIC_OR_DEPARTMENT_OTHER): Payer: Medicare Other | Admitting: Nurse Practitioner

## 2014-03-18 VITALS — BP 133/86 | HR 69 | Temp 98.5°F | Resp 28 | Ht 73.0 in | Wt 272.3 lb

## 2014-03-18 DIAGNOSIS — B20 Human immunodeficiency virus [HIV] disease: Secondary | ICD-10-CM | POA: Diagnosis not present

## 2014-03-18 DIAGNOSIS — I10 Essential (primary) hypertension: Secondary | ICD-10-CM

## 2014-03-18 DIAGNOSIS — C7952 Secondary malignant neoplasm of bone marrow: Secondary | ICD-10-CM

## 2014-03-18 DIAGNOSIS — C7951 Secondary malignant neoplasm of bone: Secondary | ICD-10-CM | POA: Diagnosis not present

## 2014-03-18 DIAGNOSIS — I890 Lymphedema, not elsewhere classified: Secondary | ICD-10-CM | POA: Diagnosis not present

## 2014-03-18 DIAGNOSIS — C469 Kaposi's sarcoma, unspecified: Secondary | ICD-10-CM

## 2014-03-18 DIAGNOSIS — IMO0001 Reserved for inherently not codable concepts without codable children: Secondary | ICD-10-CM | POA: Diagnosis not present

## 2014-03-18 MED ORDER — POTASSIUM CHLORIDE CRYS ER 20 MEQ PO TBCR: 20.0000 meq | EXTENDED_RELEASE_TABLET | Freq: Every day | ORAL | Status: DC

## 2014-03-18 NOTE — Progress Notes (Addendum)
[facility] OFFICE PROGRESS NOTE   Diagnosis:  Kaposi's sarcoma  INTERVAL HISTORY:   Steven [name] is a 33 year old man diagnosed with HIV/AIDS in 2010 with multiple lytic bone lesions. 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 He completed Doxil (question 5 versus 6 cycles) 04/29/2009 through 09/22/2009 and then 3 cycles of paclitaxel 12/01/2009 through 02/09/2010. He received the chemotherapy under the direction of Dr. Humphrey Rolls. In August 2011 he was found to have bilateral subdural hematomas and underwent craniotomy with evacuation on 03/05/2010. Chemotherapy was subsequently discontinued.  CT scans done 10/28/2013 showed a new prevascular mass measuring 2.4 x 4.8 cm which the radiologist commented may be thymic or nodal in origin. Small bowel mesenteric lymph nodes appeared grossly unchanged. Widespread lytic metastases were stable. PET scan on 12/06/2013 showed mild low level FDG uptake in the anterior mediastinal mass with more pronounced FDG accumulation within the dominant mesenteric lymph node. Heterogeneous marrow uptake noted consistent with known bone metastases.  He was seen by Dr. Caleen Jobs at [facility] on 01/22/2014 with recommendations for a repeat PET/CT at a three-month interval.  The followup PET scan done on 03/13/2014 showed a decrease in low level FDG uptake associated with the prevascular mass; similar degree of FDG uptake associated with the small mesenteric lymph nodes; persistent increased FDG uptake associated with the diffuse lytic bone metastasis.  He is seen today to transition his care to Dr. Benay Spice.  In addition to the HIV/AIDS (followed by Dr. Johnnye Sima) and Kaposi's sarcoma he reports asthma, allergies, lower extremity lymphedema (followed at the lymphedema clinic), hyperthyroidism treated with radioactive iodine November 2012 with subsequent hypothyroidism now on Synthroid. He has a  Port-A-Cath which was originally placed 10/14/2009.  We reviewed his family history. His mother died with pancreatic cancer at age 48. 2 maternal aunts with a history of breast cancer.  He lives in [place]. He is single. No children. He was previously employed as a [employer]in [employer]. He reports he is currently on disability due to the lymphedema. No tobacco use. Social alcohol use.  He denies pain. No fevers or sweats. He had a recent cold. No unusual headaches. No vision change. Stable mild dyspnea on exertion which he relates to asthma. He has had a cough with the recent cold. No mouth sores. He has occasional nausea/vomiting. No diarrhea. No hematuria or dysuria. He denies bleeding. He has a few skin lesions which were evaluated by a dermatologist and felt to not represent KS.    Objective:  Vital signs in last 24 hours:  Blood pressure 133/86, pulse 69, temperature 98.5 F (36.9 C), temperature source Oral, resp. rate 28, height 6\' 1"  (1.854 m), weight 272 lb 4.8 oz (123.514 kg), SpO2 98.00%.    HEENT: White coating over tongue. Lymphatics: No palpable cervical, supraclavicular or axillary lymph nodes. Resp: Lungs clear bilaterally. Cardio: Regular rate and rhythm. GI: Abdomen soft and nontender. No organomegaly. Vascular: Both legs are wrapped.  Skin: Approximate 1 cm raised hyperpigmented lesion at the left mid thigh with a smaller similar lesion located laterally. Similar small lesions left and right upper arms.    Lab Results:  Lab Results  Component Value Date   WBC 6.5 03/13/2014   HGB 17.2* 03/13/2014   HCT 55.3* 03/13/2014   MCV 81.9 03/13/2014   PLT 164 03/13/2014   NEUTROABS 4.0 03/13/2014    Imaging:  No results found.  Medications: I have reviewed the patient's current medications.  Assessment/Plan:  1. 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   Status post Doxil (5 cycles 04/29/2009 through 09/22/2009 and then 3 cycles of paclitaxel 12/01/2009 through 02/09/2010.   Treatment subsequently placed on hold due to bilateral subdural hematomas.   CT scans 10/28/2013 showed a new prevascular mass with PET scan showing mild low level FDG uptake in the anterior mediastinal mass with more pronounced FDG accumulation within the dominant mesenteric lymph node.   Restaging PET scan 03/13/2014 showed a decrease in low level FDG uptake associated with the prevascular mass; similar degree of FDG uptake associated with small mesenteric lymph nodes; persistent increased FDG uptake associated with the diffuse lytic bone metastasis. CT scan also 03/13/2014 showed interval decrease in the size of the anterior mediastinal mass, unchanged multiple small bowel mesenteric lymph nodes and re-demonstrated widespread lytic osseous metastasis. 2. HIV/AIDS followed by Dr. Johnnye Sima. He continues Abacavir-Dolutegravir-Lamivud.  3. History of bilateral subdural hematomas status post evacuation 03/05/2010. 4. Hypertension. 5. Renal dysfunction. 6. Asthma. 7. History of hyperthyroidism status post radioactive iodine November 2012 with subsequent hypothyroidism now on Synthroid. 8. Port-A-Cath placement 10/14/2009. 9. Lower extremity edema, bilateral, question lymphedema. He is followed at the lymphedema clinic. 10. Skin lesions.   Disposition: Steven [name] appears stable. Dr. Benay Spice reviewed the recent CT and PET scan results with him. There is no radiographic or clinical evidence of disease progression. We will continue to follow on an observation approach.   He understands the importance of continued followup with Dr. Johnnye Sima.  He will return for a Port-A-Cath flush in 6 weeks and 12 weeks. We will see him in followup in approximately 4-1/2 months. He will contact the office in the interim with any problems. We  specifically discussed any change in the skin lesions.  Patient seen with Dr. Benay Spice. 30 minutes were spent face-to-face at today's visit with the majority of that time involved in counseling/coordination of care.    Ned Card ANP/GNP-BC   03/18/2014  3:51 PM  This was a shared visit with Ned Card. There is no clinical or x-ray evidence for progression of the Kaposi sarcoma. The leg edema may be related to polypharmacy, thyroid disease, or HIV. I cannot relate the leg edema directly to the treatment for Kaposi sarcoma.   Julieanne Manson, M.D.

## 2014-03-18 NOTE — Telephone Encounter (Signed)
gv pt appt schedule for oct 2015 thru march 2016

## 2014-03-20 ENCOUNTER — Encounter: Payer: Medicare Other | Admitting: Physical Therapy

## 2014-03-21 ENCOUNTER — Encounter: Payer: Medicare Other | Admitting: Physical Therapy

## 2014-03-24 ENCOUNTER — Encounter: Payer: Medicare Other | Admitting: Physical Therapy

## 2014-03-26 ENCOUNTER — Encounter: Payer: Medicare Other | Admitting: Physical Therapy

## 2014-03-27 ENCOUNTER — Encounter: Payer: Medicare Other | Admitting: Physical Therapy

## 2014-03-31 ENCOUNTER — Ambulatory Visit: Payer: Medicare Other | Admitting: Internal Medicine

## 2014-04-07 ENCOUNTER — Other Ambulatory Visit: Payer: Self-pay | Admitting: Infectious Diseases

## 2014-04-07 DIAGNOSIS — B20 Human immunodeficiency virus [HIV] disease: Secondary | ICD-10-CM

## 2014-04-20 ENCOUNTER — Other Ambulatory Visit: Payer: Self-pay | Admitting: Nurse Practitioner

## 2014-04-20 DIAGNOSIS — C469 Kaposi's sarcoma, unspecified: Secondary | ICD-10-CM

## 2014-04-24 ENCOUNTER — Ambulatory Visit (HOSPITAL_BASED_OUTPATIENT_CLINIC_OR_DEPARTMENT_OTHER): Payer: Medicare Other

## 2014-04-24 DIAGNOSIS — C7951 Secondary malignant neoplasm of bone: Secondary | ICD-10-CM | POA: Diagnosis not present

## 2014-04-24 DIAGNOSIS — Z452 Encounter for adjustment and management of vascular access device: Secondary | ICD-10-CM | POA: Diagnosis not present

## 2014-04-24 DIAGNOSIS — Z95828 Presence of other vascular implants and grafts: Secondary | ICD-10-CM

## 2014-04-24 MED ORDER — SODIUM CHLORIDE 0.9 % IJ SOLN
10.0000 mL | INTRAMUSCULAR | Status: DC | PRN
  Administered 2014-04-24: 10 mL via INTRAVENOUS
  Filled 2014-04-24: qty 10

## 2014-04-24 MED ORDER — HEPARIN SOD (PORK) LOCK FLUSH 100 UNIT/ML IV SOLN
500.0000 [IU] | Freq: Once | INTRAVENOUS | Status: AC
  Administered 2014-04-24: 500 [IU] via INTRAVENOUS
  Filled 2014-04-24: qty 5

## 2014-04-24 NOTE — Patient Instructions (Signed)

## 2014-04-25 DIAGNOSIS — J309 Allergic rhinitis, unspecified: Secondary | ICD-10-CM | POA: Diagnosis not present

## 2014-04-25 DIAGNOSIS — J4550 Severe persistent asthma, uncomplicated: Secondary | ICD-10-CM | POA: Diagnosis not present

## 2014-05-20 DIAGNOSIS — J309 Allergic rhinitis, unspecified: Secondary | ICD-10-CM | POA: Diagnosis not present

## 2014-05-20 DIAGNOSIS — J4540 Moderate persistent asthma, uncomplicated: Secondary | ICD-10-CM | POA: Diagnosis not present

## 2014-05-22 ENCOUNTER — Ambulatory Visit: Payer: Medicare Other

## 2014-05-26 ENCOUNTER — Ambulatory Visit (INDEPENDENT_AMBULATORY_CARE_PROVIDER_SITE_OTHER): Payer: Medicare Other | Admitting: *Deleted

## 2014-05-26 DIAGNOSIS — Z23 Encounter for immunization: Secondary | ICD-10-CM | POA: Diagnosis not present

## 2014-06-06 ENCOUNTER — Ambulatory Visit (HOSPITAL_BASED_OUTPATIENT_CLINIC_OR_DEPARTMENT_OTHER): Payer: Medicare Other

## 2014-06-06 ENCOUNTER — Other Ambulatory Visit: Payer: Self-pay | Admitting: *Deleted

## 2014-06-06 VITALS — BP 118/52 | HR 73 | Temp 98.4°F

## 2014-06-06 DIAGNOSIS — C469 Kaposi's sarcoma, unspecified: Secondary | ICD-10-CM

## 2014-06-06 DIAGNOSIS — Z452 Encounter for adjustment and management of vascular access device: Secondary | ICD-10-CM

## 2014-06-06 DIAGNOSIS — K529 Noninfective gastroenteritis and colitis, unspecified: Secondary | ICD-10-CM

## 2014-06-06 DIAGNOSIS — Z95828 Presence of other vascular implants and grafts: Secondary | ICD-10-CM

## 2014-06-06 MED ORDER — LIDOCAINE-PRILOCAINE 2.5-2.5 % EX CREA: 1.0000 "application " | TOPICAL_CREAM | CUTANEOUS | Status: DC | PRN

## 2014-06-06 MED ORDER — SODIUM CHLORIDE 0.9 % IJ SOLN
10.0000 mL | INTRAMUSCULAR | Status: DC | PRN
  Administered 2014-06-06: 10 mL via INTRAVENOUS
  Filled 2014-06-06: qty 10

## 2014-06-06 MED ORDER — HEPARIN SOD (PORK) LOCK FLUSH 100 UNIT/ML IV SOLN
500.0000 [IU] | Freq: Once | INTRAVENOUS | Status: AC
  Administered 2014-06-06: 500 [IU] via INTRAVENOUS
  Filled 2014-06-06: qty 5

## 2014-06-06 MED ORDER — LIDOCAINE-PRILOCAINE 2.5-2.5 % EX CREA: TOPICAL_CREAM | Freq: Once | CUTANEOUS | Status: DC

## 2014-06-06 NOTE — Patient Instructions (Signed)

## 2014-07-17 ENCOUNTER — Ambulatory Visit (HOSPITAL_BASED_OUTPATIENT_CLINIC_OR_DEPARTMENT_OTHER): Payer: Medicare Other

## 2014-07-17 DIAGNOSIS — C469 Kaposi's sarcoma, unspecified: Secondary | ICD-10-CM

## 2014-07-17 DIAGNOSIS — Z452 Encounter for adjustment and management of vascular access device: Secondary | ICD-10-CM | POA: Diagnosis not present

## 2014-07-17 MED ORDER — SODIUM CHLORIDE 0.9 % IJ SOLN
10.0000 mL | INTRAMUSCULAR | Status: DC | PRN
  Administered 2014-07-17: 10 mL via INTRAVENOUS
  Filled 2014-07-17: qty 10

## 2014-07-17 MED ORDER — HEPARIN SOD (PORK) LOCK FLUSH 100 UNIT/ML IV SOLN
500.0000 [IU] | Freq: Once | INTRAVENOUS | Status: AC
  Administered 2014-07-17: 500 [IU] via INTRAVENOUS
  Filled 2014-07-17: qty 5

## 2014-07-22 ENCOUNTER — Other Ambulatory Visit (INDEPENDENT_AMBULATORY_CARE_PROVIDER_SITE_OTHER): Payer: Medicare Other

## 2014-07-22 DIAGNOSIS — H1045 Other chronic allergic conjunctivitis: Secondary | ICD-10-CM | POA: Diagnosis not present

## 2014-07-22 DIAGNOSIS — B20 Human immunodeficiency virus [HIV] disease: Secondary | ICD-10-CM | POA: Diagnosis not present

## 2014-07-22 DIAGNOSIS — Z79899 Other long term (current) drug therapy: Secondary | ICD-10-CM

## 2014-07-22 DIAGNOSIS — Z113 Encounter for screening for infections with a predominantly sexual mode of transmission: Secondary | ICD-10-CM | POA: Diagnosis not present

## 2014-07-22 DIAGNOSIS — J309 Allergic rhinitis, unspecified: Secondary | ICD-10-CM | POA: Diagnosis not present

## 2014-07-22 DIAGNOSIS — J4540 Moderate persistent asthma, uncomplicated: Secondary | ICD-10-CM | POA: Diagnosis not present

## 2014-07-22 LAB — COMPREHENSIVE METABOLIC PANEL
ALBUMIN: 4.1 g/dL (ref 3.5–5.2)
ALT: 25 U/L (ref 0–53)
AST: 22 U/L (ref 0–37)
Alkaline Phosphatase: 58 U/L (ref 39–117)
BUN: 13 mg/dL (ref 6–23)
CALCIUM: 9.6 mg/dL (ref 8.4–10.5)
CO2: 30 meq/L (ref 19–32)
Chloride: 100 mEq/L (ref 96–112)
Creat: 1.24 mg/dL (ref 0.50–1.35)
GLUCOSE: 112 mg/dL — AB (ref 70–99)
POTASSIUM: 3.6 meq/L (ref 3.5–5.3)
Sodium: 140 mEq/L (ref 135–145)
TOTAL PROTEIN: 7.1 g/dL (ref 6.0–8.3)
Total Bilirubin: 1.2 mg/dL (ref 0.2–1.2)

## 2014-07-22 LAB — CBC
HCT: 50.6 % (ref 39.0–52.0)
HEMOGLOBIN: 16.7 g/dL (ref 13.0–17.0)
MCH: 25.6 pg — ABNORMAL LOW (ref 26.0–34.0)
MCHC: 33 g/dL (ref 30.0–36.0)
MCV: 77.6 fL — ABNORMAL LOW (ref 78.0–100.0)
Platelets: 186 10*3/uL (ref 150–400)
RBC: 6.52 MIL/uL — AB (ref 4.22–5.81)
RDW: 15.4 % (ref 11.5–15.5)
WBC: 5.8 10*3/uL (ref 4.0–10.5)

## 2014-07-22 LAB — RPR TITER

## 2014-07-22 LAB — LIPID PANEL
CHOLESTEROL: 155 mg/dL (ref 0–200)
HDL: 41 mg/dL (ref >39)
LDL Cholesterol: 101 mg/dL — ABNORMAL HIGH (ref 0–99)
TRIGLYCERIDES: 64 mg/dL (ref <150)
Total CHOL/HDL Ratio: 3.8 Ratio
VLDL: 13 mg/dL (ref 0–40)

## 2014-07-22 LAB — RPR: RPR: REACTIVE — AB

## 2014-07-23 LAB — HIV-1 RNA QUANT-NO REFLEX-BLD: HIV 1 RNA Quant: <20 copies/mL (ref <20)

## 2014-07-23 LAB — FLUORESCENT TREPONEMAL AB(FTA)-IGG-BLD: Fluorescent Treponemal ABS: REACTIVE — AB

## 2014-07-23 LAB — T-HELPER CELL (CD4) - (RCID CLINIC ONLY)
CD4 T CELL ABS: 310 /uL — AB (ref 400–2700)
CD4 T CELL HELPER: 12 % — AB (ref 33–55)

## 2014-07-26 ENCOUNTER — Telehealth: Payer: Self-pay | Admitting: Oncology

## 2014-07-26 NOTE — Telephone Encounter (Signed)
s.w pt regarding appt d.t change due to MD on call....pt ok and aware

## 2014-08-01 ENCOUNTER — Other Ambulatory Visit: Payer: Medicare Other

## 2014-08-02 ENCOUNTER — Other Ambulatory Visit: Payer: Self-pay | Admitting: Endocrinology

## 2014-08-04 ENCOUNTER — Other Ambulatory Visit: Payer: Medicare Other

## 2014-08-04 ENCOUNTER — Other Ambulatory Visit: Payer: Self-pay | Admitting: *Deleted

## 2014-08-04 DIAGNOSIS — E0590 Thyrotoxicosis, unspecified without thyrotoxic crisis or storm: Secondary | ICD-10-CM

## 2014-08-06 ENCOUNTER — Ambulatory Visit: Payer: Medicare Other | Admitting: Endocrinology

## 2014-08-07 ENCOUNTER — Encounter: Payer: Self-pay | Admitting: Infectious Diseases

## 2014-08-07 ENCOUNTER — Ambulatory Visit (INDEPENDENT_AMBULATORY_CARE_PROVIDER_SITE_OTHER): Payer: Medicare Other | Admitting: Infectious Diseases

## 2014-08-07 VITALS — BP 134/85 | HR 79 | Temp 98.2°F | Wt 276.0 lb

## 2014-08-07 DIAGNOSIS — B20 Human immunodeficiency virus [HIV] disease: Secondary | ICD-10-CM

## 2014-08-07 DIAGNOSIS — C469 Kaposi's sarcoma, unspecified: Secondary | ICD-10-CM

## 2014-08-07 NOTE — Assessment & Plan Note (Signed)
He is being watched by heme/onc.  Seems to be doing well.

## 2014-08-07 NOTE — Assessment & Plan Note (Signed)
He is doing well. Offered refused condoms.  vax up to date.  Needs repeat pneumovax this year.  Will see him back in 6 months.

## 2014-08-07 NOTE — Progress Notes (Signed)
   Subjective:    Patient ID: Steven [name], male    DOB: Dec 28, 1980, 34 y.o.   MRN: [id number]  HPI 34 yo M with hx of AIDS, KS. He was changed from TRV/ISN in 2014 to triumeq. Has had no problems with ART.  He is being monitored for his KS by Onc.  He was previously on CTX however in 2011 he had an intracranial bleed.   HIV 1 RNA QUANT (copies/mL)  Date Value  07/22/2014 <20  01/14/2014 <20  07/10/2013 <20   CD4 T CELL ABS (/uL)  Date Value  07/22/2014 310*  01/14/2014 310*  07/10/2013 190*   Has noted no new skin lesions.  No pain. No problems with Triumeq.  Has been exercising.   Review of Systems  Constitutional: Negative for appetite change and unexpected weight change.  Gastrointestinal: Negative for diarrhea and constipation.  Genitourinary: Negative for difficulty urinating.       Objective:   Physical Exam  Constitutional: He appears well-developed and well-nourished.  HENT:  Mouth/Throat: No oropharyngeal exudate.  Eyes: EOM are normal. Pupils are equal, round, and reactive to light.  Neck: Neck supple.  Cardiovascular: Normal rate, regular rhythm and normal heart sounds.   Pulmonary/Chest: Effort normal and breath sounds normal.  Abdominal: Soft. Bowel sounds are normal. He exhibits no distension. There is no tenderness.  Lymphadenopathy:    He has no cervical adenopathy.          Assessment & Plan:

## 2014-08-11 ENCOUNTER — Ambulatory Visit: Payer: Medicare Other

## 2014-08-11 ENCOUNTER — Other Ambulatory Visit (INDEPENDENT_AMBULATORY_CARE_PROVIDER_SITE_OTHER): Payer: Medicare Other

## 2014-08-11 DIAGNOSIS — E0590 Thyrotoxicosis, unspecified without thyrotoxic crisis or storm: Secondary | ICD-10-CM | POA: Diagnosis not present

## 2014-08-11 LAB — TSH: TSH: 0.19 u[IU]/mL — AB (ref 0.35–4.50)

## 2014-08-11 LAB — T4, FREE: Free T4: 1.32 ng/dL (ref 0.60–1.60)

## 2014-08-12 ENCOUNTER — Other Ambulatory Visit: Payer: Self-pay | Admitting: Internal Medicine

## 2014-08-13 ENCOUNTER — Other Ambulatory Visit: Payer: Self-pay | Admitting: *Deleted

## 2014-08-13 ENCOUNTER — Ambulatory Visit (INDEPENDENT_AMBULATORY_CARE_PROVIDER_SITE_OTHER): Payer: Medicare Other | Admitting: Endocrinology

## 2014-08-13 ENCOUNTER — Encounter: Payer: Self-pay | Admitting: Endocrinology

## 2014-08-13 VITALS — BP 114/76 | HR 81 | Temp 99.2°F | Resp 12 | Wt 274.0 lb

## 2014-08-13 DIAGNOSIS — E0500 Thyrotoxicosis with diffuse goiter without thyrotoxic crisis or storm: Secondary | ICD-10-CM

## 2014-08-13 DIAGNOSIS — B20 Human immunodeficiency virus [HIV] disease: Secondary | ICD-10-CM

## 2014-08-13 DIAGNOSIS — E890 Postprocedural hypothyroidism: Secondary | ICD-10-CM | POA: Diagnosis not present

## 2014-08-13 MED ORDER — LEVOTHYROXINE SODIUM 150 MCG PO TABS: 150.0000 ug | ORAL_TABLET | Freq: Every day | ORAL | Status: DC

## 2014-08-13 MED ORDER — ABACAVIR-DOLUTEGRAVIR-LAMIVUD 600-50-300 MG PO TABS: 1.0000 | ORAL_TABLET | Freq: Every day | ORAL | Status: DC

## 2014-08-13 NOTE — Patient Instructions (Signed)
Artificial tears OTC 2-3x daily

## 2014-08-13 NOTE — Progress Notes (Signed)
Patient ID: Steven [name], male   DOB: [birth year], 34 y.o.   MRN: [id number]   Reason for Appointment:  Hypothyroidism, new visit    History of Present Illness:   HYPOTHYROIDISM  was first diagnosed in ? Early 2013 He previously had hyperthyroidism which was treated with radioactive iodine in 05/2011 Records are not available but apparently was told that he became hypothyroid 2-3 months later He had been taking Synthroid 200 mcg daily initially and it was then reduced to 175 mcg  He was advised to follow-up after his dosage change in 7/15 but did not do so He does not think he has shakiness now and will get some fluttering of the heart only if he is very active.  He tends to stay hot. No recent weight change Overall energy level is fairly good.  Wt Readings from Last 3 Encounters:  08/13/14 274 lb (124.286 kg)  08/07/14 276 lb (125.193 kg)  03/18/14 272 lb 4.8 oz (123.514 kg)    He is quite compliant with taking his medication in the morning before breakfast and has not missed any doses.  He does take Centrum multivitamin after breakfast   Lab Results  Component Value Date   FREET4 1.32 08/11/2014   FREET4 1.34 01/13/2014   FREET4 2.52* 03/28/2011   TSH 0.19* 08/11/2014   TSH 0.04* 01/13/2014   TSH 0.009* 03/28/2011      Past Medical History  Diagnosis Date  . HIV positive 03/23/09    Genotype Y181C  . HTN (hypertension)   . Syphilis 03/23/09-    1:2  . Kaposi's sarcoma   . Cancer   . Asthma     No past surgical history on file.  Family History  Problem Relation Age of Onset  . Pancreatic cancer Mother   . Diabetes Paternal Grandmother   . Thyroid disease Paternal Grandmother     Social History:  reports that he has never smoked. He has never used smokeless tobacco. He reports that he drinks alcohol. He reports that he does not use illicit drugs.  Allergies:  Allergies  Allergen Reactions  . Lisinopril Swelling    Swelling of lower lip while on  lisinopril  . Penicillins Hives and Swelling    REACTION: rash and swelling  . Aspirin Nausea Only      Medication List       This list is accurate as of: 08/13/14 11:12 AM.  Always use your most recent med list.               Abacavir-Dolutegravir-Lamivud 600-50-300 MG Tabs  Commonly known as:  TRIUMEQ  Take 1 tablet by mouth daily.     acetaminophen 500 MG tablet  Commonly known as:  TYLENOL  Take 500 mg by mouth every 6 (six) hours as needed. Pain or fever     albuterol 108 (90 BASE) MCG/ACT inhaler  Commonly known as:  PROVENTIL HFA;VENTOLIN HFA  Inhale 2 puffs into the lungs every 4 (four) hours as needed for wheezing or shortness of breath.     amLODipine 10 MG tablet  Commonly known as:  NORVASC  Take 1 tablet (10 mg total) by mouth daily.     fexofenadine 180 MG tablet  Commonly known as:  ALLEGRA  Take 1 tablet (180 mg total) by mouth daily.     fluticasone-salmeterol 115-21 MCG/ACT inhaler  Commonly known as:  ADVAIR HFA  Inhale 2 puffs into the lungs 2 (two) times daily.     hydrochlorothiazide 25  MG tablet  Commonly known as:  HYDRODIURIL  Take 1 tablet (25 mg total) by mouth daily.     levothyroxine 175 MCG tablet  Commonly known as:  SYNTHROID  Take 1 tablet (175 mcg total) by mouth daily before breakfast.     lidocaine-prilocaine cream  Commonly known as:  EMLA  Apply 1 application topically as needed. Apply topically to port a cath 30 min to 1 hour before access.     multivitamin tablet  Take 1 tablet by mouth daily.     potassium chloride SA 20 MEQ tablet  Commonly known as:  K-DUR,KLOR-CON  TAKE 1 TABLET BY MOUTH DAILY        Review of Systems:  His eyes get irritated and dry especially in the mornings and he uses Visine. He thinks he has allergies also but not better with steroid drops. Symptoms have been present since he had hyperthyroidism  CARDIOLOGY:  history of high blood pressure treated by PCP             Examination:     BP 114/76 mmHg  Pulse 81  Temp(Src) 99.2 F (37.3 C) (Oral)  Resp 12  Wt 274 lb (124.286 kg)  SpO2 96%  He looks well. Mild prominence of eyes without significant swelling.  Has mild conjunctival erythema Neurological: REFLEXES: at biceps are slightly brisk.  He has mild tremor present        Assessment:   Hypothyroidism, post ablative and currently on 175 mcg levothyroxine but with persistently low TSH levels. 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

## 2014-08-13 NOTE — Telephone Encounter (Signed)
ADAP Application 

## 2014-08-14 ENCOUNTER — Other Ambulatory Visit: Payer: Self-pay | Admitting: *Deleted

## 2014-08-14 MED ORDER — LEVOTHYROXINE SODIUM 150 MCG PO TABS: 150.0000 ug | ORAL_TABLET | Freq: Every day | ORAL | Status: DC

## 2014-08-28 ENCOUNTER — Ambulatory Visit (HOSPITAL_BASED_OUTPATIENT_CLINIC_OR_DEPARTMENT_OTHER): Payer: Medicare Other

## 2014-08-28 VITALS — BP 132/69 | HR 81 | Temp 98.3°F

## 2014-08-28 DIAGNOSIS — C469 Kaposi's sarcoma, unspecified: Secondary | ICD-10-CM

## 2014-08-28 DIAGNOSIS — Z452 Encounter for adjustment and management of vascular access device: Secondary | ICD-10-CM | POA: Diagnosis not present

## 2014-08-28 DIAGNOSIS — Z95828 Presence of other vascular implants and grafts: Secondary | ICD-10-CM

## 2014-08-28 MED ORDER — HEPARIN SOD (PORK) LOCK FLUSH 100 UNIT/ML IV SOLN
500.0000 [IU] | Freq: Once | INTRAVENOUS | Status: AC
  Administered 2014-08-28: 500 [IU] via INTRAVENOUS
  Filled 2014-08-28: qty 5

## 2014-08-28 MED ORDER — SODIUM CHLORIDE 0.9 % IJ SOLN
10.0000 mL | INTRAMUSCULAR | Status: DC | PRN
  Administered 2014-08-28: 10 mL via INTRAVENOUS
  Filled 2014-08-28: qty 10

## 2014-08-28 NOTE — Patient Instructions (Signed)

## 2014-09-09 ENCOUNTER — Encounter: Payer: Self-pay | Admitting: Endocrinology

## 2014-09-11 ENCOUNTER — Other Ambulatory Visit: Payer: Self-pay | Admitting: *Deleted

## 2014-09-12 ENCOUNTER — Telehealth: Payer: Self-pay | Admitting: Oncology

## 2014-09-12 NOTE — Telephone Encounter (Signed)
Lft msg for pt confirming GBS to LT per 03/03 POF, time was only thing that changed no date..... KJ

## 2014-09-14 ENCOUNTER — Other Ambulatory Visit: Payer: Self-pay | Admitting: Infectious Diseases

## 2014-09-15 ENCOUNTER — Telehealth: Payer: Self-pay | Admitting: Oncology

## 2014-09-15 ENCOUNTER — Ambulatory Visit (HOSPITAL_BASED_OUTPATIENT_CLINIC_OR_DEPARTMENT_OTHER): Payer: Medicare Other | Admitting: Nurse Practitioner

## 2014-09-15 VITALS — BP 125/60 | HR 79 | Temp 99.1°F | Resp 18 | Ht 73.0 in | Wt 279.8 lb

## 2014-09-15 DIAGNOSIS — C469 Kaposi's sarcoma, unspecified: Secondary | ICD-10-CM | POA: Diagnosis not present

## 2014-09-15 DIAGNOSIS — L989 Disorder of the skin and subcutaneous tissue, unspecified: Secondary | ICD-10-CM | POA: Diagnosis not present

## 2014-09-15 DIAGNOSIS — B20 Human immunodeficiency virus [HIV] disease: Secondary | ICD-10-CM

## 2014-09-15 DIAGNOSIS — R600 Localized edema: Secondary | ICD-10-CM

## 2014-09-15 DIAGNOSIS — R110 Nausea: Secondary | ICD-10-CM

## 2014-09-15 DIAGNOSIS — C7951 Secondary malignant neoplasm of bone: Secondary | ICD-10-CM | POA: Diagnosis not present

## 2014-09-15 MED ORDER — PROCHLORPERAZINE MALEATE 5 MG PO TABS: 5.0000 mg | ORAL_TABLET | Freq: Four times a day (QID) | ORAL | Status: DC | PRN

## 2014-09-15 NOTE — Progress Notes (Addendum)
[facility] OFFICE PROGRESS NOTE   Diagnosis:  Kaposi's sarcoma  INTERVAL HISTORY:   Steven [name] returns as scheduled. He overall feels well. He is periodically fatigued. Over the past 2-3 weeks he has been extremity intermittent nausea. No vomiting. He has a good appetite. No weight loss. No fevers or sweats. He has noted that a few skin lesions have become "darker and harder".  Objective:  Vital signs in last 24 hours:  Blood pressure 125/60, pulse 79, temperature 99.1 F (37.3 C), temperature source Oral, resp. rate 18, height 6\' 1"  (1.854 m), weight 279 lb 12.8 oz (126.916 kg).    HEENT: Mild white coating over tongue. Lymphatics: No palpable cervical, supra clavicular, axillary or inguinal lymph nodes. Resp: Lungs clear bilaterally. Cardio: Regular rate and rhythm. GI: Abdomen soft and nontender. No organomegaly. Vascular: Firm edema lower legs bilaterally. Chronic stasis changes bilaterally.  Skin: Approximate 1 cm nodular hyperpigmented lesion at the left mid thigh with a smaller similar lesion located laterally. Approximate 1 cm nodular hyperpigmented lesion at the left upper outer arm.   Lab Results:  Lab Results  Component Value Date   WBC 5.8 07/22/2014   HGB 16.7 07/22/2014   HCT 50.6 07/22/2014   MCV 77.6* 07/22/2014   PLT 186 07/22/2014   NEUTROABS 4.0 03/13/2014    Imaging:  No results found.  Medications: I have reviewed the patient's current medications.  Assessment/Plan: 1. 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   Status post Doxil (5 cycles 04/29/2009 through 09/22/2009 and then 3 cycles of paclitaxel 12/01/2009 through 02/09/2010.   Treatment subsequently placed on hold due to bilateral subdural hematomas.   CT scans 10/28/2013 showed a new prevascular mass with  PET scan showing mild low level FDG uptake in the anterior mediastinal mass with more pronounced FDG accumulation within the dominant mesenteric lymph node.   Restaging PET scan 03/13/2014 showed a decrease in low level FDG uptake associated with the prevascular mass; similar degree of FDG uptake associated with small mesenteric lymph nodes; persistent increased FDG uptake associated with the diffuse lytic bone metastasis. CT scan also 03/13/2014 showed interval decrease in the size of the anterior mediastinal mass, unchanged multiple small bowel mesenteric lymph nodes and re-demonstrated widespread lytic osseous metastasis. 2. HIV/AIDS followed by Dr. Johnnye [name]. He continues Abacavir-Dolutegravir-Lamivud.  3. History of bilateral subdural hematomas status post evacuation 03/05/2010. 4. Hypertension. 5. Renal dysfunction. 6. Asthma. 7. History of hyperthyroidism status post radioactive iodine November 2012 with subsequent hypothyroidism now on Synthroid. 8. Port-A-Cath placement 10/14/2009. 9. Lower extremity edema, bilateral, question lymphedema. He is followed at the lymphedema clinic. 10. Skin lesions.   Disposition: Steven [name] appears stable. The etiology of the nodular skin lesions is not clear. We are making a referral to dermatology for a biopsy. For the nausea he will try Compazine 5 mg 1-2 tablets every 6 hours as needed. We will see him back in one month.  Patient seen with Dr. Benay Spice.  Steven [name] ANP/GNP-BC   09/15/2014  3:33 PM  This was a shared visit with Steven [name]. Steven [name] was interviewed and examined. We will refer him to dermatology to evaluate the skin lesions.  He will return for an office visit in one month. He will follow-up with his primary physician or Dr. Johnnye [name] for persistent nausea.  Julieanne Manson, M.D.

## 2014-09-15 NOTE — Telephone Encounter (Signed)
Please fax records to [phone]...the patient sched for 3.24

## 2014-09-16 ENCOUNTER — Telehealth: Payer: Self-pay | Admitting: Oncology

## 2014-09-16 ENCOUNTER — Ambulatory Visit: Payer: Medicare Other | Admitting: Oncology

## 2014-09-16 NOTE — Telephone Encounter (Signed)
Faxed pt medical records to Dr. Elvera Lennox [phone]

## 2014-10-13 ENCOUNTER — Telehealth: Payer: Self-pay | Admitting: Nurse Practitioner

## 2014-10-13 ENCOUNTER — Ambulatory Visit: Payer: Medicare Other | Admitting: Nurse Practitioner

## 2014-10-13 NOTE — Telephone Encounter (Signed)
Pt called to r/s and add labs/flush, states he hasn't had them in a while and he is r/s due to dermatology apt is later in the month.... Cherylann Banas

## 2014-11-10 ENCOUNTER — Telehealth: Payer: Self-pay | Admitting: Nurse Practitioner

## 2014-11-10 NOTE — Telephone Encounter (Signed)
Pt called to r/s labs/ov due to his dermatology apt isn't until June, will c/b to r/s earlier if he can get in earlier... KJ

## 2014-11-11 ENCOUNTER — Ambulatory Visit: Payer: Medicare Other | Admitting: Nurse Practitioner

## 2014-11-11 ENCOUNTER — Other Ambulatory Visit: Payer: Medicare Other

## 2014-11-12 ENCOUNTER — Ambulatory Visit (INDEPENDENT_AMBULATORY_CARE_PROVIDER_SITE_OTHER): Payer: Medicare Other | Admitting: Endocrinology

## 2014-11-12 ENCOUNTER — Encounter: Payer: Self-pay | Admitting: Endocrinology

## 2014-11-12 DIAGNOSIS — E890 Postprocedural hypothyroidism: Secondary | ICD-10-CM

## 2014-11-12 NOTE — Progress Notes (Signed)
Patient ID: Steven [name], male   DOB: 04-22-1981, 34 y.o.   MRN: [id number]   Reason for Appointment:  Hypothyroidism, new visit    History of Present Illness:   HYPOTHYROIDISM  was first diagnosed in ? Early 2013 He previously had hyperthyroidism which was treated with radioactive iodine in 05/2011 Records are not available but apparently was told that he became hypothyroid 2-3 months later He had been taking Synthroid 200 mcg daily initially and it was then reduced to 175 mcg  Because of persistently low TSH the dose was again reduced in 08/2014 down to 150 g Does not complain of any palpitations or excessive heat No fatigue He has gained weight  Wt Readings from Last 3 Encounters:  11/12/14 287 lb 6.4 oz (130.364 kg)  09/15/14 279 lb 12.8 oz (126.916 kg)  08/13/14 274 lb (124.286 kg)    He is quite compliant with taking his medication in the morning before breakfast and has not missed any doses.  He does take Centrum multivitamin after breakfast   Lab Results  Component Value Date   FREET4 1.32 08/11/2014   FREET4 1.34 01/13/2014   FREET4 2.52* 03/28/2011   TSH 0.19* 08/11/2014   TSH 0.04* 01/13/2014   TSH 0.009* 03/28/2011      Past Medical History  Diagnosis Date  . HIV positive 03/23/09    Genotype Y181C  . HTN (hypertension)   . Syphilis 03/23/09-    1:2  . Kaposi's sarcoma   . Cancer   . Asthma     No past surgical history on file.  Family History  Problem Relation Age of Onset  . Pancreatic cancer Mother   . Diabetes Paternal Grandmother   . Thyroid disease Paternal Grandmother     Social History:  reports that he has never smoked. He has never used smokeless tobacco. He reports that he drinks alcohol. He reports that he does not use illicit drugs.  Allergies:  Allergies  Allergen Reactions  . Lisinopril Swelling    Swelling of lower lip while on lisinopril  . Penicillins Hives and Swelling    REACTION: rash and swelling  . Aspirin Nausea  Only      Medication List       This list is accurate as of: 11/12/14  4:44 PM.  Always use your most recent med list.               Abacavir-Dolutegravir-Lamivud 600-50-300 MG Tabs  Commonly known as:  TRIUMEQ  Take 1 tablet by mouth daily.     acetaminophen 500 MG tablet  Commonly known as:  TYLENOL  Take 500 mg by mouth every 6 (six) hours as needed. Pain or fever     albuterol 108 (90 BASE) MCG/ACT inhaler  Commonly known as:  PROVENTIL HFA;VENTOLIN HFA  Inhale 2 puffs into the lungs every 4 (four) hours as needed for wheezing or shortness of breath.     amLODipine 10 MG tablet  Commonly known as:  NORVASC  Take 1 tablet (10 mg total) by mouth daily.     fexofenadine 180 MG tablet  Commonly known as:  ALLEGRA  Take 1 tablet (180 mg total) by mouth daily.     fluticasone-salmeterol 115-21 MCG/ACT inhaler  Commonly known as:  ADVAIR HFA  Inhale 2 puffs into the lungs 2 (two) times daily.     hydrochlorothiazide 25 MG tablet  Commonly known as:  HYDRODIURIL  Take 1 tablet (25 mg total) by mouth daily.  levothyroxine 150 MCG tablet  Commonly known as:  SYNTHROID, LEVOTHROID  Take 1 tablet (150 mcg total) by mouth daily.     lidocaine-prilocaine cream  Commonly known as:  EMLA  Apply 1 application topically as needed. Apply topically to port a cath 30 min to 1 hour before access.     montelukast 10 MG tablet  Commonly known as:  SINGULAIR     multivitamin tablet  Take 1 tablet by mouth daily.     potassium chloride SA 20 MEQ tablet  Commonly known as:  K-DUR,KLOR-CON  TAKE 1 TABLET BY MOUTH DAILY     prochlorperazine 5 MG tablet  Commonly known as:  COMPAZINE  Take 1-2 tablets (5-10 mg total) by mouth every 6 (six) hours as needed for nausea or vomiting.     QVAR 80 MCG/ACT inhaler  Generic drug:  beclomethasone        Review of Systems:  His eyes are not as irritated with using artificial tears.  Also he thinks he has had more allergies  lately   CARDIOLOGY:  history of high blood pressure treated by PCP             Examination:    BP 128/84 mmHg  Pulse 86  Temp(Src) 98.5 F (36.9 C)  Resp 16  Ht '6\' 1"'$  (1.854 m)  Wt 287 lb 6.4 oz (130.364 kg)  BMI 37.93 kg/m2  SpO2 97%  He looks well. Mild prominence of eyes without significant swelling.   Has mild conjunctival erythema  REFLEXES: at biceps are normal.  He has mild tremor present        Assessment:   Hypothyroidism, post ablative and currently on 150 mcg levothyroxine and requiring somewhat less doses of supplements Currently clinically he looks euthyroid and has been compliant with his thyroid supplement  He does have mild signs of Graves eye disease and has less symptoms with using artificial tears  Treatment:    Check free T4 and TSH today and decide on dosage and follow-up   Steven [name] 11/12/2014, 4:44 PM

## 2014-11-13 ENCOUNTER — Other Ambulatory Visit: Payer: Self-pay | Admitting: *Deleted

## 2014-11-13 LAB — T4, FREE: FREE T4: 1.15 ng/dL (ref 0.60–1.60)

## 2014-11-13 LAB — TSH: TSH: 0.82 u[IU]/mL (ref 0.35–4.50)

## 2014-11-13 MED ORDER — LEVOTHYROXINE SODIUM 150 MCG PO TABS: 150.0000 ug | ORAL_TABLET | Freq: Every day | ORAL | Status: DC

## 2014-11-13 NOTE — Progress Notes (Signed)
Quick Note:  Please let patient know that the lab result is normal and will continue the same dose, follow-up to be scheduled in 6 months with labs, needs new prescription with 5 refills ______

## 2014-11-24 ENCOUNTER — Encounter: Payer: Self-pay | Admitting: Internal Medicine

## 2014-11-24 ENCOUNTER — Ambulatory Visit: Payer: Medicare Other | Attending: Internal Medicine | Admitting: Internal Medicine

## 2014-11-24 VITALS — BP 133/84 | HR 76 | Temp 99.1°F | Resp 16 | Ht 73.0 in | Wt 293.0 lb

## 2014-11-24 DIAGNOSIS — B20 Human immunodeficiency virus [HIV] disease: Secondary | ICD-10-CM | POA: Diagnosis not present

## 2014-11-24 DIAGNOSIS — Z21 Asymptomatic human immunodeficiency virus [HIV] infection status: Secondary | ICD-10-CM | POA: Diagnosis not present

## 2014-11-24 DIAGNOSIS — E0500 Thyrotoxicosis with diffuse goiter without thyrotoxic crisis or storm: Secondary | ICD-10-CM | POA: Diagnosis not present

## 2014-11-24 DIAGNOSIS — J45909 Unspecified asthma, uncomplicated: Secondary | ICD-10-CM | POA: Diagnosis not present

## 2014-11-24 DIAGNOSIS — I10 Essential (primary) hypertension: Secondary | ICD-10-CM | POA: Diagnosis not present

## 2014-11-24 DIAGNOSIS — Z7951 Long term (current) use of inhaled steroids: Secondary | ICD-10-CM | POA: Diagnosis not present

## 2014-11-24 DIAGNOSIS — E890 Postprocedural hypothyroidism: Secondary | ICD-10-CM | POA: Diagnosis not present

## 2014-11-24 DIAGNOSIS — I872 Venous insufficiency (chronic) (peripheral): Secondary | ICD-10-CM | POA: Insufficient documentation

## 2014-11-24 DIAGNOSIS — Z8589 Personal history of malignant neoplasm of other organs and systems: Secondary | ICD-10-CM | POA: Diagnosis not present

## 2014-11-24 DIAGNOSIS — Z923 Personal history of irradiation: Secondary | ICD-10-CM | POA: Insufficient documentation

## 2014-11-24 MED ORDER — AMLODIPINE BESYLATE 10 MG PO TABS
10.0000 mg | ORAL_TABLET | Freq: Every day | ORAL | Status: DC
Start: 2014-11-24 — End: 2015-08-27

## 2014-11-24 MED ORDER — HYDROCHLOROTHIAZIDE 25 MG PO TABS: 25.0000 mg | ORAL_TABLET | Freq: Every day | ORAL | Status: DC

## 2014-11-24 NOTE — Progress Notes (Signed)
F/U HTN  Requesting medicine refill   Rx Amlodipine and Rx HCTZ No Hx Tobacco

## 2014-11-24 NOTE — Progress Notes (Addendum)
Subjective:     Patient ID: Steven [name], male   DOB: September 13, 1980, 34 y.o.   MRN: [id number]  HPI  [name] is 34 yo African American male with history of HIV, Kaposi's Sarcoma, hypothyroidism s/p radioiodine therapy for Graves disease, and essential HTN here today for medication refill for amlodipine and hydrochlorathiazide. Patient reports taking all of his medications everyday, does not miss any doses. Has been eating fairly healthy diet low in salt intake and has been working on exercising more frequently. Reports wearing compression stockings for chronic swelling in both feet, but the swelling is decreased with elevating feet and avoiding long periods of standing. Denies headaches, blurry vision, nausea, vomiting.   Notes he recently saw endocrinologist 11/13/14 for hypothyroidism and TSH, free T4 levels were in normal range. Denies cold or heat intolerance, fatigue.   Patient says he is tolerating the antiretroviral therapy well and reports HIV viral load recently checked by infectious disease specialist was undetectable. Denies fever, chills, rash, SOB, dyspnea, cough, congestive symptoms. Denies any urinary symptoms and no changes in bowel habits.    Active Ambulatory Problems    Diagnosis Date Noted  . Human immunodeficiency virus (HIV) disease 04/10/2009  . SYPHILIS 04/06/2009  . CANDIDIASIS, ORAL 04/10/2009  . HYPERTENSION 04/10/2009  . 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 LYMPHEDEMA 04/10/2009  . KS (Kaposi's sarcoma) 04/11/2011  . Colitis 05/27/2011  . Renal dysfunction 04/12/2012  . Arthralgia of elbow, right 07/29/2013  . 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 Post-nasal drip 02/05/2014   Resolved Ambulatory Problems    Diagnosis Date Noted  . BACTERIAL PNEUMONIA 01/06/2010  . RENAL INSUFFICIENCY 01/06/2010  . Hyperthyroidism 04/05/2011   Past Medical History  Diagnosis Date  . HIV positive 03/23/09  . HTN  (hypertension)   . Syphilis 03/23/09-  . Kaposi's sarcoma   . Cancer   . Asthma      Medication List       This list is accurate as of: 11/24/14  9:52 AM.  Always use your most recent med list.               Abacavir-Dolutegravir-Lamivud 600-50-300 MG Tabs  Commonly known as:  TRIUMEQ  Take 1 tablet by mouth daily.     acetaminophen 500 MG tablet  Commonly known as:  TYLENOL  Take 500 mg by mouth every 6 (six) hours as needed. Pain or fever     albuterol 108 (90 BASE) MCG/ACT inhaler  Commonly known as:  PROVENTIL HFA;VENTOLIN HFA  Inhale 2 puffs into the lungs every 4 (four) hours as needed for wheezing or shortness of breath.     amLODipine 10 MG tablet  Commonly known as:  NORVASC  Take 1 tablet (10 mg total) by mouth daily.     fexofenadine 180 MG tablet  Commonly known as:  ALLEGRA  Take 1 tablet (180 mg total) by mouth daily.     fluticasone-salmeterol 115-21 MCG/ACT inhaler  Commonly known as:  ADVAIR HFA  Inhale 2 puffs into the lungs 2 (two) times daily.     hydrochlorothiazide 25 MG tablet  Commonly known as:  HYDRODIURIL  Take 1 tablet (25 mg total) by mouth daily.     levothyroxine 150 MCG tablet  Commonly known as:  SYNTHROID, LEVOTHROID  Take 1 tablet (150 mcg total) by mouth daily.     lidocaine-prilocaine cream  Commonly known as:  EMLA  Apply 1 application topically as needed. Apply topically to port a  cath 30 min to 1 hour before access.     montelukast 10 MG tablet  Commonly known as:  SINGULAIR     multivitamin tablet  Take 1 tablet by mouth daily.     potassium chloride SA 20 MEQ tablet  Commonly known as:  K-DUR,KLOR-CON  TAKE 1 TABLET BY MOUTH DAILY     prochlorperazine 5 MG tablet  Commonly known as:  COMPAZINE  Take 1-2 tablets (5-10 mg total) by mouth every 6 (six) hours as needed for nausea or vomiting.     QVAR 80 MCG/ACT inhaler  Generic drug:  beclomethasone         Review of Systems  Constitutional: Negative for  fever, chills, activity change and fatigue.  HENT: Negative for congestion.   Eyes: Negative for visual disturbance.  Respiratory: Negative for cough and shortness of breath.   Cardiovascular: Positive for leg swelling. Negative for chest pain and palpitations.  Gastrointestinal: Negative for nausea, vomiting, diarrhea and constipation.  Endocrine: Negative for cold intolerance and heat intolerance.  Genitourinary: Negative for difficulty urinating.  Skin: Negative for rash.  Neurological: Negative for numbness and headaches.   Objective: Filed Vitals:   11/24/14 0915  BP: 133/84  Pulse: 76  Temp: 99.1 F (37.3 C)  Resp: 16      Physical Exam  Constitutional:  Well-appearing, very pleasant 34 yo man alert, oriented, and in no acute distress.  Eyes: Conjunctivae and EOM are normal. Pupils are equal, round, and reactive to light.  Cardiovascular: Normal rate, regular rhythm, normal heart sounds and intact distal pulses.   Pulmonary/Chest: Effort normal and breath sounds normal.  Musculoskeletal: Normal range of motion. He exhibits edema.  Skin:  Hyperpigmented, scaly plaques bilaterally on anterior shins consistent with rash due to chronic venous insufficiency.    Assessment & Plan:    Mr. Tozzi is 34 yo African American male with history of HIV, Kaposi's Sarcoma, hypothyroidism s/p radioiodine therapy for Graves disease, and essential HTN here today for medication refill for amlodipine and hydrochlorathiazide.  Essential Hypertension  Patient's BP today is 133/84. BP has been in normal range and well-controlled over the past year. Patient is adherent with medications and takes them everyday. Counseled on DASH diet and daily exercise. Will refill medications as per patient's request.  Orders: -     hydrochlorothiazide (HYDRODIURIL) 25 MG tablet; Take 1 tablet (25 mg total) by mouth daily. -     amLODipine (NORVASC) 10 MG tablet; Take 1 tablet (10 mg total) by mouth  daily.  Hypothyroidism, postradioiodine therapy  Patient is being followed by endocrinology for therapy to treat hypothyroidism and TSH & free T4 levels checked 11/12/14 by endocrinology were in normal range. Does not report any symptoms concerning for poorly controlled hypothyroidism. No medication adjustment needed today.  HIV disease Patient is being followed by infectious disease specialist for HIV. HIV RNA viral load checked 07/22/14 was undetectable. Patient is tolerating antiretroviral therapy well and takes medications everyday. Does not report any symptoms concerning for any new infections. No medication adjustment needed today.  Evaluation and management procedures were performed by me with Medical Student in attendance, note written by medical student under my supervision and collaboration. I have reviewed the note and I agree with the management and plan.   Angelica Chessman, MD, [place], [place], [place], [place] and [place], [place]   11/24/2014, 10:22 AM

## 2014-11-24 NOTE — Patient Instructions (Signed)
Hypothyroidism The thyroid is a large gland located in the lower front of your neck. The thyroid gland helps control metabolism. Metabolism is how your body handles food. It controls metabolism with the hormone thyroxine. When this gland is underactive (hypothyroid), it produces too little hormone.  CAUSES These include:   Absence or destruction of thyroid tissue.  Goiter due to iodine deficiency.  Goiter due to medications.  Congenital defects (since birth).  Problems with the pituitary. This causes a lack of TSH (thyroid stimulating hormone). This hormone tells the thyroid to turn out more hormone. SYMPTOMS  Lethargy (feeling as though you have no energy)  Cold intolerance  Weight gain (in spite of normal food intake)  Dry skin  Coarse hair  Menstrual irregularity (if severe, may lead to infertility)  Slowing of thought processes Cardiac problems are also caused by insufficient amounts of thyroid hormone. Hypothyroidism in the newborn is cretinism, and is an extreme form. It is important that this form be treated adequately and immediately or it will lead rapidly to retarded physical and mental development. DIAGNOSIS  To prove hypothyroidism, your caregiver may do blood tests and ultrasound tests. Sometimes the signs are hidden. It may be necessary for your caregiver to watch this illness with blood tests either before or after diagnosis and treatment. TREATMENT  Low levels of thyroid hormone are increased by using synthetic thyroid hormone. This is a safe, effective treatment. It usually takes about four weeks to gain the full effects of the medication. After you have the full effect of the medication, it will generally take another four weeks for problems to leave. Your caregiver may start you on low doses. If you have had heart problems the dose may be gradually increased. It is generally not an emergency to get rapidly to normal. HOME CARE INSTRUCTIONS   Take your  medications as your caregiver suggests. Let your caregiver know of any medications you are taking or start taking. Your caregiver will help you with dosage schedules.  As your condition improves, your dosage needs may increase. It will be necessary to have continuing blood tests as suggested by your caregiver.  Report all suspected medication side effects to your caregiver. SEEK MEDICAL CARE IF: Seek medical care if you develop:  Sweating.  Tremulousness (tremors).  Anxiety.  Rapid weight loss.  Heat intolerance.  Emotional swings.  Diarrhea.  Weakness. SEEK IMMEDIATE MEDICAL CARE IF:  You develop chest pain, an irregular heart beat (palpitations), or a rapid heart beat. MAKE SURE YOU:   Understand these instructions.  Will watch your condition.  Will get help right away if you are not doing well or get worse. Document Released: 06/27/2005 Document Revised: 09/19/2011 Document Reviewed: 02/15/2008 [facility] Patient Information 2015 [place], [place]. This information is not intended to replace advice given to you by your health care provider. Make sure you discuss any questions you have with your health care provider. DASH Eating Plan DASH stands for "Dietary Approaches to Stop Hypertension." The DASH eating plan is a healthy eating plan that has been shown to reduce high blood pressure (hypertension). Additional health benefits may include reducing the risk of type 2 diabetes mellitus, heart disease, and stroke. The DASH eating plan may also help with weight loss. WHAT DO I NEED TO KNOW ABOUT THE DASH EATING PLAN? For the DASH eating plan, you will follow these general guidelines:  Choose foods with a percent daily value for sodium of less than 5% (as listed on the food label).  Use salt-free seasonings or herbs instead of table salt or sea salt.  Check with your health care provider or pharmacist before using salt substitutes.  Eat lower-sodium products, often labeled as  "lower sodium" or "no salt added."  Eat fresh foods.  Eat more vegetables, fruits, and low-fat dairy products.  Choose whole grains. Look for the word "whole" as the first word in the ingredient list.  Choose fish and skinless chicken or [place] more often than red meat. Limit fish, poultry, and meat to 6 oz (170 g) each day.  Limit sweets, desserts, sugars, and sugary drinks.  Choose heart-healthy fats.  Limit cheese to 1 oz (28 g) per day.  Eat more home-cooked food and less restaurant, buffet, and fast food.  Limit fried foods.  Cook foods using methods other than frying.  Limit canned vegetables. If you do use them, rinse them well to decrease the sodium.  When eating at a restaurant, ask that your food be prepared with less salt, or no salt if possible. WHAT FOODS CAN I EAT? Seek help from a dietitian for individual calorie needs. Grains Whole grain or whole wheat bread. Brown rice. Whole grain or whole wheat pasta. Quinoa, bulgur, and whole grain cereals. Low-sodium cereals. Corn or whole wheat flour tortillas. Whole grain cornbread. Whole grain crackers. Low-sodium crackers. Vegetables Fresh or frozen vegetables (raw, steamed, roasted, or grilled). Low-sodium or reduced-sodium tomato and vegetable juices. Low-sodium or reduced-sodium tomato sauce and paste. Low-sodium or reduced-sodium canned vegetables.  Fruits All fresh, canned (in natural juice), or frozen fruits. Meat and Other Protein Products Ground beef (85% or leaner), grass-fed beef, or beef trimmed of fat. Skinless chicken or [place]. Ground chicken or [place]. Pork trimmed of fat. All fish and seafood. Eggs. Dried beans, peas, or lentils. Unsalted nuts and seeds. Unsalted canned beans. Dairy Low-fat dairy products, such as skim or 1% milk, 2% or reduced-fat cheeses, low-fat ricotta or cottage cheese, or plain low-fat yogurt. Low-sodium or reduced-sodium cheeses. Fats and Oils Tub margarines without trans fats.  Light or reduced-fat mayonnaise and salad dressings (reduced sodium). Avocado. Safflower, olive, or canola oils. Natural peanut or almond butter. Other Unsalted popcorn and pretzels. The items listed above may not be a complete list of recommended foods or beverages. Contact your dietitian for more options. WHAT FOODS ARE NOT RECOMMENDED? Grains White bread. White pasta. White rice. Refined cornbread. Bagels and croissants. Crackers that contain trans fat. Vegetables Creamed or fried vegetables. Vegetables in a cheese sauce. Regular canned vegetables. Regular canned tomato sauce and paste. Regular tomato and vegetable juices. Fruits Dried fruits. Canned fruit in light or heavy syrup. Fruit juice. Meat and Other Protein Products Fatty cuts of meat. Ribs, chicken wings, bacon, sausage, bologna, salami, chitterlings, fatback, hot dogs, bratwurst, and packaged luncheon meats. Salted nuts and seeds. Canned beans with salt. Dairy Whole or 2% milk, cream, half-and-half, and cream cheese. Whole-fat or sweetened yogurt. Full-fat cheeses or blue cheese. Nondairy creamers and whipped toppings. Processed cheese, cheese spreads, or cheese curds. Condiments Onion and garlic salt, seasoned salt, table salt, and sea salt. Canned and packaged gravies. Worcestershire sauce. Tartar sauce. Barbecue sauce. Teriyaki sauce. Soy sauce, including reduced sodium. Steak sauce. Fish sauce. Oyster sauce. Cocktail sauce. Horseradish. Ketchup and mustard. Meat flavorings and tenderizers. Bouillon cubes. Hot sauce. Tabasco sauce. Marinades. Taco seasonings. Relishes. Fats and Oils Butter, stick margarine, lard, shortening, ghee, and bacon fat. Coconut, palm kernel, or palm oils. Regular salad dressings. Other Pickles and olives. Salted popcorn and  pretzels. The items listed above may not be a complete list of foods and beverages to avoid. Contact your dietitian for more information. WHERE CAN I FIND MORE  INFORMATION? National Heart, Lung, and Blood Institute: travelstabloid.com Document Released: 06/16/2011 Document Revised: 11/11/2013 Document Reviewed: 05/01/2013 [facility] Patient Information 2015 [place], [place]. This information is not intended to replace advice given to you by your health care provider. Make sure you discuss any questions you have with your health care provider. Hypertension Hypertension, commonly called high blood pressure, is when the force of blood pumping through your arteries is too strong. Your arteries are the blood vessels that carry blood from your heart throughout your body. A blood pressure reading consists of a higher number over a lower number, such as 110/72. The higher number (systolic) is the pressure inside your arteries when your heart pumps. The lower number (diastolic) is the pressure inside your arteries when your heart relaxes. Ideally you want your blood pressure below 120/80. Hypertension forces your heart to work harder to pump blood. Your arteries may become narrow or stiff. Having hypertension puts you at risk for heart disease, stroke, and other problems.  RISK FACTORS Some risk factors for high blood pressure are controllable. Others are not.  Risk factors you cannot control include:   Race. You may be at higher risk if you are African American.  Age. Risk increases with age.  Gender. Men are at higher risk than women before age 104 years. After age 23, women are at higher risk than men. Risk factors you can control include:  Not getting enough exercise or physical activity.  Being overweight.  Getting too much fat, sugar, calories, or salt in your diet.  Drinking too much alcohol. SIGNS AND SYMPTOMS Hypertension does not usually cause signs or symptoms. Extremely high blood pressure (hypertensive crisis) may cause headache, anxiety, shortness of breath, and nosebleed. DIAGNOSIS  To check if you have  hypertension, your health care provider will measure your blood pressure while you are seated, with your arm held at the level of your heart. It should be measured at least twice using the same arm. Certain conditions can cause a difference in blood pressure between your right and left arms. A blood pressure reading that is higher than normal on one occasion does not mean that you need treatment. If one blood pressure reading is high, ask your health care provider about having it checked again. TREATMENT  Treating high blood pressure includes making lifestyle changes and possibly taking medicine. Living a healthy lifestyle can help lower high blood pressure. You may need to change some of your habits. Lifestyle changes may include:  Following the DASH diet. This diet is high in fruits, vegetables, and whole grains. It is low in salt, red meat, and added sugars.  Getting at least 2 hours of brisk physical activity every week.  Losing weight if necessary.  Not smoking.  Limiting alcoholic beverages.  Learning ways to reduce stress. If lifestyle changes are not enough to get your blood pressure under control, your health care provider may prescribe medicine. You may need to take more than one. Work closely with your health care provider to understand the risks and benefits. HOME CARE INSTRUCTIONS  Have your blood pressure rechecked as directed by your health care provider.   Take medicines only as directed by your health care provider. Follow the directions carefully. Blood pressure medicines must be taken as prescribed. The medicine does not work as well when you skip  doses. Skipping doses also puts you at risk for problems.   Do not smoke.   Monitor your blood pressure at home as directed by your health care provider. SEEK MEDICAL CARE IF:   You think you are having a reaction to medicines taken.  You have recurrent headaches or feel dizzy.  You have swelling in your  ankles.  You have trouble with your vision. SEEK IMMEDIATE MEDICAL CARE IF:  You develop a severe headache or confusion.  You have unusual weakness, numbness, or feel faint.  You have severe chest or abdominal pain.  You vomit repeatedly.  You have trouble breathing. MAKE SURE YOU:   Understand these instructions.  Will watch your condition.  Will get help right away if you are not doing well or get worse. Document Released: 06/27/2005 Document Revised: 11/11/2013 Document Reviewed: 04/19/2013 [facility] Patient Information 2015 [place], [place]. This information is not intended to replace advice given to you by your health care provider. Make sure you discuss any questions you have with your health care provider.

## 2014-12-08 ENCOUNTER — Other Ambulatory Visit: Payer: Self-pay | Admitting: Infectious Diseases

## 2014-12-16 DIAGNOSIS — D2372 Other benign neoplasm of skin of left lower limb, including hip: Secondary | ICD-10-CM | POA: Diagnosis not present

## 2014-12-16 DIAGNOSIS — D2361 Other benign neoplasm of skin of right upper limb, including shoulder: Secondary | ICD-10-CM | POA: Diagnosis not present

## 2014-12-16 DIAGNOSIS — I8312 Varicose veins of left lower extremity with inflammation: Secondary | ICD-10-CM | POA: Diagnosis not present

## 2014-12-16 DIAGNOSIS — I872 Venous insufficiency (chronic) (peripheral): Secondary | ICD-10-CM | POA: Diagnosis not present

## 2014-12-16 DIAGNOSIS — L821 Other seborrheic keratosis: Secondary | ICD-10-CM | POA: Diagnosis not present

## 2014-12-16 DIAGNOSIS — I8311 Varicose veins of right lower extremity with inflammation: Secondary | ICD-10-CM | POA: Diagnosis not present

## 2014-12-19 ENCOUNTER — Other Ambulatory Visit: Payer: Self-pay | Admitting: *Deleted

## 2014-12-21 ENCOUNTER — Other Ambulatory Visit: Payer: Self-pay | Admitting: Nurse Practitioner

## 2014-12-22 ENCOUNTER — Other Ambulatory Visit: Payer: Medicare Other

## 2014-12-22 ENCOUNTER — Ambulatory Visit: Payer: Medicare Other | Admitting: Nurse Practitioner

## 2015-01-06 DIAGNOSIS — J309 Allergic rhinitis, unspecified: Secondary | ICD-10-CM | POA: Diagnosis not present

## 2015-01-06 DIAGNOSIS — J4540 Moderate persistent asthma, uncomplicated: Secondary | ICD-10-CM | POA: Diagnosis not present

## 2015-01-26 ENCOUNTER — Other Ambulatory Visit: Payer: Medicare Other

## 2015-01-27 ENCOUNTER — Telehealth: Payer: Self-pay | Admitting: Nurse Practitioner

## 2015-01-27 NOTE — Telephone Encounter (Signed)
Pt called to r/s labs/flush/ov confirmed new D/T for pt... KJ

## 2015-02-02 ENCOUNTER — Other Ambulatory Visit: Payer: Self-pay | Admitting: *Deleted

## 2015-02-03 ENCOUNTER — Ambulatory Visit (HOSPITAL_BASED_OUTPATIENT_CLINIC_OR_DEPARTMENT_OTHER): Payer: Medicare Other | Admitting: Nurse Practitioner

## 2015-02-03 ENCOUNTER — Telehealth: Payer: Self-pay | Admitting: Oncology

## 2015-02-03 ENCOUNTER — Other Ambulatory Visit: Payer: Medicare Other

## 2015-02-03 ENCOUNTER — Ambulatory Visit (HOSPITAL_BASED_OUTPATIENT_CLINIC_OR_DEPARTMENT_OTHER): Payer: Medicare Other

## 2015-02-03 VITALS — BP 142/65 | HR 82 | Temp 98.9°F | Resp 19 | Ht 73.0 in | Wt 309.2 lb

## 2015-02-03 DIAGNOSIS — Z95828 Presence of other vascular implants and grafts: Secondary | ICD-10-CM

## 2015-02-03 DIAGNOSIS — C469 Kaposi's sarcoma, unspecified: Secondary | ICD-10-CM

## 2015-02-03 DIAGNOSIS — E039 Hypothyroidism, unspecified: Secondary | ICD-10-CM | POA: Diagnosis not present

## 2015-02-03 DIAGNOSIS — R110 Nausea: Secondary | ICD-10-CM

## 2015-02-03 DIAGNOSIS — L989 Disorder of the skin and subcutaneous tissue, unspecified: Secondary | ICD-10-CM | POA: Diagnosis not present

## 2015-02-03 MED ORDER — SODIUM CHLORIDE 0.9 % IJ SOLN
10.0000 mL | INTRAMUSCULAR | Status: DC | PRN
  Administered 2015-02-03: 10 mL via INTRAVENOUS
  Filled 2015-02-03: qty 10

## 2015-02-03 MED ORDER — HEPARIN SOD (PORK) LOCK FLUSH 100 UNIT/ML IV SOLN
500.0000 [IU] | Freq: Once | INTRAVENOUS | Status: AC
  Administered 2015-02-03: 500 [IU] via INTRAVENOUS
  Filled 2015-02-03: qty 5

## 2015-02-03 NOTE — Telephone Encounter (Signed)
Gave and printed appt sched and avs for pt for Aug thru [facility]

## 2015-02-03 NOTE — Progress Notes (Signed)
Called Dr. Juanna Cao [facility]) office for last office note from June. Left message with fax number and callback number.   Called Dr. Bruna Potter office for last office note to be faxed. Nurse states she will fax it over this afternoon.

## 2015-02-03 NOTE — Progress Notes (Signed)
[place] OFFICE PROGRESS NOTE   Diagnosis:  Kaposi's sarcoma  INTERVAL HISTORY:   Steven [name] returns for follow-up. The skin lesions are unchanged. He reports evaluation by dermatology. He reports the skin lesions are "moles". The nausea he was experiencing at the time of his last visit is significantly better. He now rarely has nausea. He reports a good appetite. He is gaining weight. He notes increased leg swelling and wonders if this may account for some of the weight gain.  Objective:  Vital signs in last 24 hours:  Blood pressure 142/65, pulse 82, temperature 98.9 F (37.2 C), temperature source Oral, resp. rate 19, height '6\' 1"'$  (1.854 m), weight 309 lb 3.2 oz (140.252 kg), SpO2 98 %.    HEENT: Mild white coating over tongue. Lymphatics: No palpable cervical or supraclavicular lymph nodes. Resp: Lungs clear bilaterally. Cardio: Regular rate and rhythm. GI: Abdomen soft and nontender. No organomegaly. Vascular: Firm edema at the lower legs bilaterally. Chronic stasis changes bilaterally. Skin: 1 cm nodular hyperpigmented lesion at the left mid thigh with a smaller similar lesion located laterally. 1 cm nodular hyperpigmented lesion at the left upper outer arm. 1/2 cm hyperpigmented nodular lesion right mid upper arm. 1 cm hyperpigmented nodular lesion right upper outer arm  Port-A-Cath without erythema.  Lab Results:  Lab Results  Component Value Date   WBC 5.8 07/22/2014   HGB 16.7 07/22/2014   HCT 50.6 07/22/2014   MCV 77.6* 07/22/2014   PLT 186 07/22/2014   NEUTROABS 4.0 03/13/2014    Imaging:  No results found.  Medications: I have reviewed the patient's current medications.  Assessment/Plan: 1. 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   Status post Doxil (5 cycles 04/29/2009  through 09/22/2009 and then 3 cycles of paclitaxel 12/01/2009 through 02/09/2010.   Treatment subsequently placed on hold due to bilateral subdural hematomas.   CT scans 10/28/2013 showed a new prevascular mass with PET scan showing mild low level FDG uptake in the anterior mediastinal mass with more pronounced FDG accumulation within the dominant mesenteric lymph node.   Restaging PET scan 03/13/2014 showed a decrease in low level FDG uptake associated with the prevascular mass; similar degree of FDG uptake associated with small mesenteric lymph nodes; persistent increased FDG uptake associated with the diffuse lytic bone metastasis. CT scan also 03/13/2014 showed interval decrease in the size of the anterior mediastinal mass, unchanged multiple small bowel mesenteric lymph nodes and re-demonstrated widespread lytic osseous metastasis. 2. HIV/AIDS followed by Dr. Johnnye [name]. He continues Abacavir-Dolutegravir-Lamivud.  3. History of bilateral subdural hematomas status post evacuation 03/05/2010. 4. Hypertension. 5. Renal dysfunction. 6. Asthma. 7. History of hyperthyroidism status post radioactive iodine November 2012 with subsequent hypothyroidism now on Synthroid. 8. Port-A-Cath placement 10/14/2009. 9. Lower extremity edema, bilateral, question lymphedema. He is followed at the lymphedema clinic. 10. Skin lesions. Status post evaluation by dermatology.   Disposition: Steven [name] appears stable. He has had the skin lesions evaluated by dermatology. The skin lesions are unchanged.  He will return for a Port-A-Cath flush in 6 weeks and 12 weeks. We will see him in follow-up with a Port-A-Cath flush in 4-1/2 months. He will contact the office in the interim with any problems.  Plan reviewed with Dr. Benay Spice.    Ned Card ANP/GNP-BC   02/03/2015  2:41 PM

## 2015-02-03 NOTE — Patient Instructions (Signed)

## 2015-02-04 ENCOUNTER — Telehealth: Payer: Self-pay

## 2015-02-04 NOTE — Telephone Encounter (Signed)
Fax received from [facility]. Placed on Ned Card, NP desk.

## 2015-02-05 ENCOUNTER — Telehealth: Payer: Self-pay

## 2015-02-05 NOTE — Telephone Encounter (Signed)
Needs to ask his ID doctor I would be hesitant with his h/o cancer

## 2015-02-05 NOTE — Telephone Encounter (Signed)
xolair is the name of the medication Dr Neldon Mc wanted him to start. He wants Dr Benay Spice to verify if this is OK in light of his oncology history.

## 2015-02-05 NOTE — Telephone Encounter (Signed)
Pt called about a medication he discussed with Tiffany at his appt Wed.

## 2015-02-06 ENCOUNTER — Telehealth: Payer: Self-pay

## 2015-02-06 NOTE — Telephone Encounter (Signed)
S/w pt about message below.

## 2015-02-06 NOTE — Telephone Encounter (Signed)
Called and informed pt of Dr. Gearldine Shown reccomendation on  [place]. Pt was informed to contact Dr. Johnnye Sima to discuss it with him as well. Pt verbalized understanding and denies any questions or concerns at this time.

## 2015-02-06 NOTE — Telephone Encounter (Signed)
-----   Message from Owens Shark, NP sent at 02/05/2015  4:03 PM EDT ----- Please let Steven [name] know that Dr. Benay Spice recommends he NOT begin this medication. In addition Dr. Benay Spice would like for him to discuss it with Dr. Johnnye Sima.

## 2015-02-09 ENCOUNTER — Ambulatory Visit: Payer: Medicare Other | Admitting: Infectious Diseases

## 2015-02-09 ENCOUNTER — Telehealth: Payer: Self-pay | Admitting: *Deleted

## 2015-02-09 NOTE — Telephone Encounter (Signed)
Patient will come 8/2 11:45 for SPAP renewal, he knows what to bring for renewal. Landis Gandy, RN

## 2015-02-10 ENCOUNTER — Ambulatory Visit: Payer: Medicare Other

## 2015-03-10 ENCOUNTER — Other Ambulatory Visit: Payer: Medicare Other

## 2015-03-10 DIAGNOSIS — B20 Human immunodeficiency virus [HIV] disease: Secondary | ICD-10-CM

## 2015-03-11 LAB — COMPREHENSIVE METABOLIC PANEL
ALT: 34 U/L (ref 9–46)
AST: 24 U/L (ref 10–40)
Albumin: 4.3 g/dL (ref 3.6–5.1)
Alkaline Phosphatase: 69 U/L (ref 40–115)
BUN: 18 mg/dL (ref 7–25)
CHLORIDE: 93 mmol/L — AB (ref 98–110)
CO2: 32 mmol/L — ABNORMAL HIGH (ref 20–31)
CREATININE: 1.41 mg/dL — AB (ref 0.60–1.35)
Calcium: 9.7 mg/dL (ref 8.6–10.3)
GLUCOSE: 338 mg/dL — AB (ref 65–99)
POTASSIUM: 3.4 mmol/L — AB (ref 3.5–5.3)
SODIUM: 137 mmol/L (ref 135–146)
TOTAL PROTEIN: 7.4 g/dL (ref 6.1–8.1)
Total Bilirubin: 0.6 mg/dL (ref 0.2–1.2)

## 2015-03-11 LAB — CBC
HCT: 48.8 % (ref 39.0–52.0)
Hemoglobin: 15.7 g/dL (ref 13.0–17.0)
MCH: 26.3 pg (ref 26.0–34.0)
MCHC: 32.2 g/dL (ref 30.0–36.0)
MCV: 81.9 fL (ref 78.0–100.0)
PLATELETS: 178 10*3/uL (ref 150–400)
RBC: 5.96 MIL/uL — AB (ref 4.22–5.81)
RDW: 14.4 % (ref 11.5–15.5)
WBC: 6.3 10*3/uL (ref 4.0–10.5)

## 2015-03-12 LAB — T-HELPER CELL (CD4) - (RCID CLINIC ONLY)
CD4 % Helper T Cell: 19 % — ABNORMAL LOW (ref 33–55)
CD4 T Cell Abs: 410 /uL (ref 400–2700)

## 2015-03-12 LAB — HIV-1 RNA QUANT-NO REFLEX-BLD
HIV 1 RNA Quant: <20 copies/mL (ref <20)
HIV-1 RNA Quant, Log: <1.3 {Log} (ref <1.30)

## 2015-03-14 ENCOUNTER — Other Ambulatory Visit: Payer: Self-pay | Admitting: Infectious Diseases

## 2015-03-14 DIAGNOSIS — B20 Human immunodeficiency virus [HIV] disease: Secondary | ICD-10-CM

## 2015-03-17 ENCOUNTER — Ambulatory Visit (HOSPITAL_BASED_OUTPATIENT_CLINIC_OR_DEPARTMENT_OTHER): Payer: Medicare Other

## 2015-03-17 DIAGNOSIS — Z95828 Presence of other vascular implants and grafts: Secondary | ICD-10-CM

## 2015-03-17 DIAGNOSIS — C469 Kaposi's sarcoma, unspecified: Secondary | ICD-10-CM

## 2015-03-17 DIAGNOSIS — Z452 Encounter for adjustment and management of vascular access device: Secondary | ICD-10-CM | POA: Diagnosis not present

## 2015-03-17 MED ORDER — HEPARIN SOD (PORK) LOCK FLUSH 100 UNIT/ML IV SOLN
500.0000 [IU] | Freq: Once | INTRAVENOUS | Status: AC
  Administered 2015-03-17: 500 [IU] via INTRAVENOUS
  Filled 2015-03-17: qty 5

## 2015-03-17 MED ORDER — SODIUM CHLORIDE 0.9 % IJ SOLN
10.0000 mL | INTRAMUSCULAR | Status: DC | PRN
  Administered 2015-03-17: 10 mL via INTRAVENOUS
  Filled 2015-03-17: qty 10

## 2015-03-17 NOTE — Patient Instructions (Signed)

## 2015-03-18 NOTE — Progress Notes (Signed)
Notified [facility] by fax. Landis Gandy, RN

## 2015-03-20 ENCOUNTER — Telehealth: Payer: Self-pay | Admitting: Internal Medicine

## 2015-03-20 NOTE — Telephone Encounter (Signed)
Patient presented saying he had been feeling strange (blurred vision, dizziness, arm numb) on & off for 2-3 weeks with another occurrence today. Asking for lab work to be done for diabetes. Patient asks to be seen ASAP; please f/u with patient to discuss possibility of being seen at urgent.

## 2015-03-23 ENCOUNTER — Encounter: Payer: Self-pay | Admitting: Internal Medicine

## 2015-03-23 ENCOUNTER — Ambulatory Visit: Payer: Medicare Other | Attending: Internal Medicine | Admitting: Internal Medicine

## 2015-03-23 ENCOUNTER — Telehealth: Payer: Self-pay | Admitting: *Deleted

## 2015-03-23 VITALS — BP 137/81 | HR 101 | Temp 98.3°F | Resp 18 | Ht 73.0 in | Wt 302.0 lb

## 2015-03-23 DIAGNOSIS — N183 Chronic kidney disease, stage 3 unspecified: Secondary | ICD-10-CM | POA: Insufficient documentation

## 2015-03-23 DIAGNOSIS — I129 Hypertensive chronic kidney disease with stage 1 through stage 4 chronic kidney disease, or unspecified chronic kidney disease: Secondary | ICD-10-CM | POA: Insufficient documentation

## 2015-03-23 DIAGNOSIS — Z79899 Other long term (current) drug therapy: Secondary | ICD-10-CM | POA: Diagnosis not present

## 2015-03-23 DIAGNOSIS — I10 Essential (primary) hypertension: Secondary | ICD-10-CM

## 2015-03-23 DIAGNOSIS — B20 Human immunodeficiency virus [HIV] disease: Secondary | ICD-10-CM | POA: Diagnosis not present

## 2015-03-23 DIAGNOSIS — E890 Postprocedural hypothyroidism: Secondary | ICD-10-CM | POA: Diagnosis not present

## 2015-03-23 DIAGNOSIS — B370 Candidal stomatitis: Secondary | ICD-10-CM

## 2015-03-23 DIAGNOSIS — E119 Type 2 diabetes mellitus without complications: Secondary | ICD-10-CM | POA: Insufficient documentation

## 2015-03-23 HISTORY — DX: Candidal stomatitis: B37.0

## 2015-03-23 LAB — POCT GLYCOSYLATED HEMOGLOBIN (HGB A1C): Hemoglobin A1C: 10.6

## 2015-03-23 MED ORDER — GLIPIZIDE 10 MG PO TABS: 10.0000 mg | ORAL_TABLET | Freq: Two times a day (BID) | ORAL | Status: DC

## 2015-03-23 MED ORDER — GLUCOSE BLOOD VI STRP
ORAL_STRIP | Status: DC
Start: 2015-03-23 — End: 2015-12-08

## 2015-03-23 MED ORDER — FLUCONAZOLE 200 MG PO TABS: 200.0000 mg | ORAL_TABLET | Freq: Every day | ORAL | Status: DC

## 2015-03-23 MED ORDER — TRUE METRIX AIR GLUCOSE METER W/DEVICE KIT: 1.0000 "application " | PACK | Freq: Two times a day (BID) | Status: DC

## 2015-03-23 NOTE — Progress Notes (Signed)
Patient reports increased urination, every 15-76mn, blurry vision, dry mouth with sour taste, no energy, numbness in left side.  Patient wants blood work done. Patient suspects thrush. Patient currently denies pain. Patient reports being HIV positive and is now undetected.  Patient has portacath in right chest.

## 2015-03-23 NOTE — Progress Notes (Signed)
Patient ID: Steven [name], male   DOB: May 15, 1981, 34 y.o.   MRN: [id number]   Steven [name], is a 34 y.o. male  DBZ:[id number]  PQA:[id number]  DOB - July 19, 1980  Chief Complaint  Patient presents with  . Follow-up        Subjective:   Steven [name] is a 34 y.o. male with history of HIV, Kaposi's Sarcoma, hypothyroidism s/p radio-iodine therapy for Graves disease, and essential HTN here today for here today for a follow up visit. Patient reports increased urination, every 15-20 min, blurry vision, dry mouth with sour taste, no energy, numbness in left side. Patient wants blood work done. Patient suspects thrush. Patient currently denies pain. His viral load is currently undetectable according to patient. Patient has Port-A-Cath in right chest. Patient has No headache, No chest pain, No abdominal pain - No Nausea, No new weakness tingling or numbness, No Cough - SOB.  Problem  Ckd (Chronic Kidney Disease) Stage 3, Gfr 30-59 Ml/Min  Oral Thrush    ALLERGIES: Allergies  Allergen Reactions  . Lisinopril Swelling    Swelling of lower lip while on lisinopril  . Penicillins Hives and Swelling    REACTION: rash and swelling  . Aspirin Nausea Only    PAST MEDICAL HISTORY: Past Medical History  Diagnosis Date  . HIV positive 03/23/09    Genotype Y181C  . HTN (hypertension)   . Syphilis 03/23/09-    1:2  . Kaposi's sarcoma   . Cancer   . Asthma     MEDICATIONS AT HOME: Prior to Admission medications   Medication Sig Start Date End Date Taking? Authorizing Provider  acetaminophen (TYLENOL) 500 MG tablet Take 500 mg by mouth every 6 (six) hours as needed. Pain or fever   Yes Historical Provider, MD  albuterol (PROVENTIL HFA;VENTOLIN HFA) 108 (90 BASE) MCG/ACT inhaler Inhale 2 puffs into the lungs every 4 (four) hours as needed for wheezing or shortness of breath. 03/04/14 03/16/16 Yes Lance Bosch, NP  amLODipine (NORVASC) 10 MG tablet Take 1 tablet (10 mg total) by mouth daily.  11/24/14  Yes Tresa Garter, MD  fexofenadine (ALLEGRA) 180 MG tablet Take 1 tablet (180 mg total) by mouth daily. 05/03/12  Yes Harden Mo, MD  fluticasone-salmeterol (ADVAIR HFA) [phone] MCG/ACT inhaler Inhale 2 puffs into the lungs 2 (two) times daily. 12/13/13  Yes Tresa Garter, MD  hydrochlorothiazide (HYDRODIURIL) 25 MG tablet Take 1 tablet (25 mg total) by mouth daily. 11/24/14  Yes Tresa Garter, MD  levothyroxine (SYNTHROID, LEVOTHROID) 150 MCG tablet Take 1 tablet (150 mcg total) by mouth daily. 11/13/14  Yes Elayne Snare, MD  lidocaine-prilocaine (EMLA) cream Apply 1 application topically as needed. Apply topically to port a cath 30 min to 1 hour before access. 06/06/14  Yes Owens Shark, NP  montelukast (SINGULAIR) 10 MG tablet  10/24/14  Yes Historical Provider, MD  Multiple Vitamin (MULTIVITAMIN) tablet Take 1 tablet by mouth daily.   Yes Historical Provider, MD  potassium chloride SA (K-DUR,KLOR-CON) 20 MEQ tablet TAKE 1 TABLET BY MOUTH DAILY 04/21/14  Yes Owens Shark, NP  QVAR 80 MCG/ACT inhaler  08/29/14  Yes Historical Provider, MD  TRIUMEQ 600-50-300 MG TABS TAKE 1 TABLET BY MOUTH DAILY 03/17/15  Yes Campbell Riches, MD  Blood Glucose Monitoring Suppl (TRUE METRIX AIR GLUCOSE METER) W/DEVICE KIT 1 application by Does not apply route 2 (two) times daily. 03/23/15   Tresa Garter, MD  fluconazole (DIFLUCAN) 200 MG tablet  Take 1 tablet (200 mg total) by mouth daily. 03/23/15   Tresa Garter, MD  glipiZIDE (GLUCOTROL) 10 MG tablet Take 1 tablet (10 mg total) by mouth 2 (two) times daily before a meal. 03/23/15   Tresa Garter, MD  glucose blood (TRUE METRIX BLOOD GLUCOSE TEST) test strip Use as instructed 03/23/15   Tresa Garter, MD     Objective:   Filed Vitals:   03/23/15 1048  BP: 137/81  Pulse: 101  Temp: 98.3 F (36.8 C)  TempSrc: Oral  Resp: 18  Height: 6' 1" (1.854 m)  Weight: 302 lb (136.986 kg)  SpO2: 93%   Exam General  appearance : Awake, alert, not in any distress. Speech Clear. Not toxic looking HEENT: Atraumatic and Normocephalic, pupils equally reactive to light and accomodation Neck: supple, no JVD. No cervical lymphadenopathy.  Chest:Good air entry bilaterally, no added sounds  CVS: S1 S2 regular, no murmurs.  Abdomen: Bowel sounds present, Non tender and not distended with no gaurding, rigidity or rebound. Extremities: B/L Lower Ext shows no edema, both legs are warm to touch Neurology: Awake alert, and oriented X 3, CN II-XII intact, Non focal Skin:No Rash  Data Review Lab Results  Component Value Date   HGBA1C 10.6 03/23/2015   Assessment & Plan   1. New onset type 2 diabetes mellitus  - HgB A1c is 10.6 % today Add - glipiZIDE (GLUCOTROL) 10 MG tablet; Take 1 tablet (10 mg total) by mouth 2 (two) times daily before a meal.  Dispense: 60 tablet; Refill: 3  - Microalbumin / creatinine urine ratio - Microalbumin, urine - glucose blood (TRUE METRIX BLOOD GLUCOSE TEST) test strip; Use as instructed  Dispense: 100 each; Refill: 12  - Blood Glucose Monitoring Suppl (TRUE METRIX AIR GLUCOSE METER) W/DEVICE KIT; 1 application by Does not apply route 2 (two) times daily.  Dispense: 1 kit; Refill: 1  - Ambulatory referral to Ophthalmology - Ambulatory referral to Podiatry - Ambulatory referral to diabetic education  2. Hypothyroidism, postradioiodine therapy  Continue Levothyroxine  3. Essential hypertension  We have discussed target BP range and blood pressure goal. I have advised patient to check BP regularly and to call us back or report to clinic if the numbers are consistently higher than 140/90. We discussed the importance of compliance with medical therapy and DASH diet recommended, consequences of uncontrolled hypertension discussed.   - continue current BP medications  4. Human immunodeficiency virus (HIV) disease  Continue current management, follow up with [facility]  5. CKD  (chronic kidney disease) stage 3, GFR 30-59 ml/min  Monitoring  6. Oral thrush  - fluconazole (DIFLUCAN) 200 MG tablet; Take 1 tablet (200 mg total) by mouth daily.  Dispense: 30 tablet; Refill: 0  Patient have been counseled extensively about nutrition and exercise  Return in about 2 weeks (around 04/06/2015) for CBG, Lab/Nurse Visit.  The patient was given clear instructions to go to ER or return to medical center if symptoms don't improve, worsen or new problems develop. The patient verbalized understanding. The patient was told to call to get lab results if they haven't heard anything in the next week.   This note has been created with Surveyor, quantity. Any transcriptional errors are unintentional.    Angelica Chessman, MD, [place], Karilyn Cota, [place] and [place], [place]   03/23/2015, 12:06 PM

## 2015-03-23 NOTE — Telephone Encounter (Signed)
PT. WOULD LIKE TO DO AQUATICS PHYSICAL TRAINING FOR STRENGTHENING. DOES [place] HAVE ANY OFFERINGS?

## 2015-03-23 NOTE — Patient Instructions (Signed)
Basic Carbohydrate Counting for Diabetes Mellitus Carbohydrate counting is a method for keeping track of the amount of carbohydrates you eat. Eating carbohydrates naturally increases the level of sugar (glucose) in your blood, so it is important for you to know the amount that is okay for you to have in every meal. Carbohydrate counting helps keep the level of glucose in your blood within normal limits. The amount of carbohydrates allowed is different for every person. A dietitian can help you calculate the amount that is right for you. Once you know the amount of carbohydrates you can have, you can count the carbohydrates in the foods you want to eat. Carbohydrates are found in the following foods:  Grains, such as breads and cereals.  Dried beans and soy products.  Starchy vegetables, such as potatoes, peas, and corn.  Fruit and fruit juices.  Milk and yogurt.  Sweets and snack foods, such as cake, cookies, candy, chips, soft drinks, and fruit drinks. CARBOHYDRATE COUNTING There are two ways to count the carbohydrates in your food. You can use either of the methods or a combination of both. Reading the "Nutrition Facts" on Packaged Food The "Nutrition Facts" is an area that is included on the labels of almost all packaged food and beverages in the [place]. It includes the serving size of that food or beverage and information about the nutrients in each serving of the food, including the grams (g) of carbohydrate per serving.  Decide the number of servings of this food or beverage that you will be able to eat or drink. Multiply that number of servings by the number of grams of carbohydrate that is listed on the label for that serving. The total will be the amount of carbohydrates you will be having when you eat or drink this food or beverage. Learning Standard Serving Sizes of Food When you eat food that is not packaged or does not include "Nutrition Facts" on the label, you need to  measure the servings in order to count the amount of carbohydrates.A serving of most carbohydrate-rich foods contains about 15 g of carbohydrates. The following list includes serving sizes of carbohydrate-rich foods that provide 15 g ofcarbohydrate per serving:   1 slice of bread (1 oz) or 1 six-inch tortilla.    of a hamburger bun or English muffin.  4-6 crackers.   cup unsweetened dry cereal.    cup hot cereal.   cup rice or pasta.    cup mashed potatoes or  of a large baked potato.  1 cup fresh fruit or one small piece of fruit.    cup canned or frozen fruit or fruit juice.  1 cup milk.   cup plain fat-free yogurt or yogurt sweetened with artificial sweeteners.   cup cooked dried beans or starchy vegetable, such as peas, corn, or potatoes.  Decide the number of standard-size servings that you will eat. Multiply that number of servings by 15 (the grams of carbohydrates in that serving). For example, if you eat 2 cups of strawberries, you will have eaten 2 servings and 30 g of carbohydrates (2 servings x 15 g = 30 g). For foods such as soups and casseroles, in which more than one food is mixed in, you will need to count the carbohydrates in each food that is included. EXAMPLE OF CARBOHYDRATE COUNTING Sample Dinner  3 oz chicken breast.   cup of brown rice.   cup of corn.  1 cup milk.   1 cup strawberries with   sugar-free whipped topping.  Carbohydrate Calculation Step 1: Identify the foods that contain carbohydrates:   Rice.   Corn.   Milk.   Strawberries. Step 2:Calculate the number of servings eaten of each:   2 servings of rice.   1 serving of corn.   1 serving of milk.   1 serving of strawberries. Step 3: Multiply each of those number of servings by 15 g:   2 servings of rice x 15 g = 30 g.   1 serving of corn x 15 g = 15 g.   1 serving of milk x 15 g = 15 g.   1 serving of strawberries x 15 g = 15 g. Step 4: Add  together all of the amounts to find the total grams of carbohydrates eaten: 30 g + 15 g + 15 g + 15 g = 75 g. Document Released: 06/27/2005 Document Revised: 11/11/2013 Document Reviewed: 05/24/2013 ExitCare Patient Information 2015 [employer]. This information is not intended to replace advice given to you by your health care provider. Make sure you discuss any questions you have with your health care provider. Diabetes and Exercise Exercising regularly is important. It is not just about losing weight. It has many health benefits, such as:  Improving your overall fitness, flexibility, and endurance.  Increasing your bone density.  Helping with weight control.  Decreasing your body fat.  Increasing your muscle strength.  Reducing stress and tension.  Improving your overall health. People with diabetes who exercise gain additional benefits because exercise:  Reduces appetite.  Improves the body's use of blood sugar (glucose).  Helps lower or control blood glucose.  Decreases blood pressure.  Helps control blood lipids (such as cholesterol and triglycerides).  Improves the body's use of the hormone insulin by:  Increasing the body's insulin sensitivity.  Reducing the body's insulin needs.  Decreases the risk for heart disease because exercising:  Lowers cholesterol and triglycerides levels.  Increases the levels of good cholesterol (such as high-density lipoproteins [HDL]) in the body.  Lowers blood glucose levels. YOUR ACTIVITY PLAN  Choose an activity that you enjoy and set realistic goals. Your health care provider or diabetes educator can help you make an activity plan that works for you. Exercise regularly as directed by your health care provider. This includes:  Performing resistance training twice a week such as push-ups, sit-ups, lifting weights, or using resistance bands.  Performing 150 minutes of cardio exercises each week such as walking, running, or  playing sports.  Staying active and spending no more than 90 minutes at one time being inactive. Even short bursts of exercise are good for you. Three 10-minute sessions spread throughout the day are just as beneficial as a single 30-minute session. Some exercise ideas include:  Taking the dog for a walk.  Taking the stairs instead of the elevator.  Dancing to your favorite song.  Doing an exercise video.  Doing your favorite exercise with a friend. RECOMMENDATIONS FOR EXERCISING WITH TYPE 1 OR TYPE 2 DIABETES   Check your blood glucose before exercising. If blood glucose levels are greater than 240 mg/dL, check for urine ketones. Do not exercise if ketones are present.  Avoid injecting insulin into areas of the body that are going to be exercised. For example, avoid injecting insulin into:  The arms when playing tennis.  The legs when jogging.  Keep a record of:  Food intake before and after you exercise.  Expected peak times of insulin action.  Blood   glucose levels before and after you exercise.  The type and amount of exercise you have done.  Review your records with your health care provider. Your health care provider will help you to develop guidelines for adjusting food intake and insulin amounts before and after exercising.  If you take insulin or oral hypoglycemic agents, watch for signs and symptoms of hypoglycemia. They include:  Dizziness.  Shaking.  Sweating.  Chills.  Confusion.  Drink plenty of water while you exercise to prevent dehydration or heat stroke. Body water is lost during exercise and must be replaced.  Talk to your health care provider before starting an exercise program to make sure it is safe for you. Remember, almost any type of activity is better than none. Document Released: 09/17/2003 Document Revised: 11/11/2013 Document Reviewed: 12/04/2012 ExitCare Patient Information 2015 [employer]. This information is not intended to  replace advice given to you by your health care provider. Make sure you discuss any questions you have with your health care provider.  

## 2015-03-25 ENCOUNTER — Ambulatory Visit (INDEPENDENT_AMBULATORY_CARE_PROVIDER_SITE_OTHER): Payer: Medicare Other | Admitting: Infectious Diseases

## 2015-03-25 ENCOUNTER — Encounter: Payer: Self-pay | Admitting: Infectious Diseases

## 2015-03-25 VITALS — BP 135/83 | HR 98 | Temp 98.3°F | Ht 73.0 in | Wt 301.0 lb

## 2015-03-25 DIAGNOSIS — B20 Human immunodeficiency virus [HIV] disease: Secondary | ICD-10-CM | POA: Diagnosis not present

## 2015-03-25 DIAGNOSIS — C469 Kaposi's sarcoma, unspecified: Secondary | ICD-10-CM

## 2015-03-25 DIAGNOSIS — B370 Candidal stomatitis: Secondary | ICD-10-CM | POA: Diagnosis not present

## 2015-03-25 DIAGNOSIS — N289 Disorder of kidney and ureter, unspecified: Secondary | ICD-10-CM

## 2015-03-25 DIAGNOSIS — E119 Type 2 diabetes mellitus without complications: Secondary | ICD-10-CM

## 2015-03-25 DIAGNOSIS — Z113 Encounter for screening for infections with a predominantly sexual mode of transmission: Secondary | ICD-10-CM

## 2015-03-25 DIAGNOSIS — E118 Type 2 diabetes mellitus with unspecified complications: Secondary | ICD-10-CM | POA: Diagnosis not present

## 2015-03-25 DIAGNOSIS — IMO0002 Reserved for concepts with insufficient information to code with codable children: Secondary | ICD-10-CM

## 2015-03-25 DIAGNOSIS — I890 Lymphedema, not elsewhere classified: Secondary | ICD-10-CM

## 2015-03-25 DIAGNOSIS — E1165 Type 2 diabetes mellitus with hyperglycemia: Secondary | ICD-10-CM | POA: Insufficient documentation

## 2015-03-25 DIAGNOSIS — Z23 Encounter for immunization: Secondary | ICD-10-CM | POA: Diagnosis present

## 2015-03-25 DIAGNOSIS — J4541 Moderate persistent asthma with (acute) exacerbation: Secondary | ICD-10-CM | POA: Diagnosis not present

## 2015-03-25 MED ORDER — GLIPIZIDE 10 MG PO TABS: 5.0000 mg | ORAL_TABLET | Freq: Two times a day (BID) | ORAL | Status: DC

## 2015-03-25 NOTE — Addendum Note (Signed)
Addended by: Landis Gandy on: 03/25/2015 03:50 PM   Modules accepted: Orders

## 2015-03-25 NOTE — Assessment & Plan Note (Signed)
Asked him to get new compression hose when he sees podiatry.

## 2015-03-25 NOTE — Assessment & Plan Note (Signed)
i asked him to change his glipizide to '5mg'$  bid.  He will f/u with Dr Lenna Sciara.  Advised him that siugars in the 30-50 range are very dangerous.  I challenged him to lose wt.

## 2015-03-25 NOTE — Assessment & Plan Note (Signed)
Doing well on his current art.  He gets flu shot and pnvx today.  He is doing well, his other issues predominate.

## 2015-03-25 NOTE — Progress Notes (Signed)
   Subjective:    Patient ID: Steven [name], male    DOB: 08-14-1980, 34 y.o.   MRN: [id number]  HPI 34 yo M with hx of AIDS, KS.He is being monitored for his KS by Onc.  He was previously on CTX however in 2011 he had an intracranial bleed.   He was changed from TRV/ISN in 2014 to triumeq. Has been doing well with this.  Has had thrush as well, started on fluconazole.    He has been followed at [facility], newly dx with DM. Tyring to get his FSG at home. 38-58. Feels tired, but no dizziness, no headaches. Started on glipizide '10mg'$  bid. Has had blurry vision.  Has ophtho eval pending. Has podiatry eval pending.   FHX, SocHx reviewed in EPIC and with pt.   HIV 1 RNA QUANT (copies/mL)  Date Value  03/10/2015 <20  07/22/2014 <20  01/14/2014 <20   CD4 T CELL ABS (/uL)  Date Value  03/11/2015 410  07/22/2014 310*  01/14/2014 310*    Review of Systems  Constitutional: Negative for chills and unexpected weight change.  Eyes: Positive for visual disturbance.  Respiratory: Negative for cough and shortness of breath.   Cardiovascular: Negative for leg swelling.  Gastrointestinal: Negative for diarrhea and constipation.  Genitourinary: Positive for frequency. Negative for difficulty urinating.  Neurological: Negative for light-headedness, numbness and headaches.       Objective:   Physical Exam  Constitutional: He appears well-developed and well-nourished.  HENT:  Mouth/Throat: No oropharyngeal exudate.  Eyes: EOM are normal. Pupils are equal, round, and reactive to light.  Neck: Neck supple.  Cardiovascular: Normal rate, regular rhythm and normal heart sounds.   Pulmonary/Chest: Effort normal and breath sounds normal.  Abdominal: Soft. Bowel sounds are normal. There is no tenderness. There is no rebound.  Musculoskeletal: He exhibits edema.  Lymphadenopathy:    He has no cervical adenopathy.       Assessment & Plan:

## 2015-03-25 NOTE — Assessment & Plan Note (Signed)
Will continue to watch, ? Start ACE-I for DM

## 2015-03-25 NOTE — Assessment & Plan Note (Signed)
Improved

## 2015-03-25 NOTE — Assessment & Plan Note (Signed)
Stable. Greatly appreciate h/o f/u.

## 2015-03-26 ENCOUNTER — Telehealth: Payer: Self-pay | Admitting: Internal Medicine

## 2015-03-26 NOTE — Telephone Encounter (Signed)
Sugar level 331 to 338, it is high...pt. Is stating he has been taking prescriptions...he is concerned the levels are still high, please advise patient

## 2015-04-07 ENCOUNTER — Other Ambulatory Visit: Payer: Medicare Other

## 2015-04-07 ENCOUNTER — Encounter: Payer: Self-pay | Admitting: Podiatry

## 2015-04-07 ENCOUNTER — Ambulatory Visit (INDEPENDENT_AMBULATORY_CARE_PROVIDER_SITE_OTHER): Payer: Medicare Other | Admitting: Podiatry

## 2015-04-07 ENCOUNTER — Encounter: Payer: Medicare Other | Admitting: Pharmacist

## 2015-04-07 VITALS — BP 129/87 | HR 92 | Resp 19 | Ht 73.0 in | Wt 300.0 lb

## 2015-04-07 DIAGNOSIS — L853 Xerosis cutis: Secondary | ICD-10-CM | POA: Diagnosis not present

## 2015-04-07 DIAGNOSIS — M79676 Pain in unspecified toe(s): Secondary | ICD-10-CM

## 2015-04-07 DIAGNOSIS — B351 Tinea unguium: Secondary | ICD-10-CM | POA: Diagnosis not present

## 2015-04-07 NOTE — Progress Notes (Signed)
   Subjective:    Patient ID: Steven [name], male    DOB: August 08, 1980, 34 y.o.   MRN: [id number]  HPI 34 year old male presents the office today protective necrosed assessment and for painful, elongated toenails which she is unable to trim himself. He denies any redness or drainage Nail sites. He states that his blood sugar is "coming down". He does have chronic lower extremity edema due to Kaposi's sarcoma. No other complaints at this time. He denies any claudication symptoms or any tingling or numbness.   Review of Systems  HENT:       Sinus problems   Eyes: Positive for redness and itching.  Respiratory:       Asthma   Cardiovascular: Positive for leg swelling.  Endocrine: Positive for heat intolerance.  Allergic/Immunologic: Positive for environmental allergies.  All other systems reviewed and are negative.      Objective:   Physical Exam AAO x3, NAD DP/PT pulses palpable bilaterally, CRT less than 3 seconds Protective sensation intact with Simms Weinstein monofilament, vibratory sensation intact, Achilles tendon reflex intact Nails are hypertrophic, dystrophic, brittle, discolored, elongated 10. There is tenderness in nails 1-5 bilaterally. There is no surrounding erythema or drainage. There is chronic lower extremity edema with lymphedema changes bilaterally. There is erratic skin to the plantar heels bilaterally. No areas of tenderness to bilateral lower extremities. MMT 5/5, ROM WNL.  No open lesions or other pre-ulcerative lesions bilaterally.  No overlying edema, erythema, increase in warmth to bilateral lower extremities.  No pain with calf compression, swelling, warmth, erythema bilaterally.      Assessment & Plan:  34 year old male with symptomatic onychomycosis, chronic lower extremity changes -Treatment options discussed including all alternatives, risks, and complications -Nail sharply and debrided 10 without consultation/bleeding. -The skin on the plantar  heels were debrided without complications. Moisturizer daily.  -Daily foot inspection -Follow-up in 3 months or sooner if any problems arise. In the meantime, encouraged to call the office with any questions, concerns, change in symptoms. Follow-up with PCP for other issues mentioned in the ROS.  Celesta Gentile, DPM

## 2015-04-08 ENCOUNTER — Encounter: Payer: Self-pay | Admitting: Podiatry

## 2015-04-09 ENCOUNTER — Encounter: Payer: Medicare Other | Attending: Internal Medicine

## 2015-04-09 VITALS — Ht 73.0 in | Wt 290.1 lb

## 2015-04-09 DIAGNOSIS — Z713 Dietary counseling and surveillance: Secondary | ICD-10-CM | POA: Insufficient documentation

## 2015-04-09 DIAGNOSIS — E118 Type 2 diabetes mellitus with unspecified complications: Secondary | ICD-10-CM | POA: Insufficient documentation

## 2015-04-09 DIAGNOSIS — E1165 Type 2 diabetes mellitus with hyperglycemia: Secondary | ICD-10-CM

## 2015-04-09 DIAGNOSIS — IMO0002 Reserved for concepts with insufficient information to code with codable children: Secondary | ICD-10-CM

## 2015-04-09 NOTE — Progress Notes (Signed)
Patient was seen on 04/09/15 for the first of a series of three diabetes self-management courses at [facility].  Patient Education Plan per assessed needs and concerns is to attend four course education program for Diabetes Self Management Education.  The following learning objectives were met by the patient during this class:  Describe diabetes  State some common risk factors for diabetes  Defines the role of glucose and insulin  Identifies type of diabetes and pathophysiology  Describe the relationship between diabetes and cardiovascular risk  State the members of the Healthcare Team  States the rationale for glucose monitoring  State when to test glucose  State their individual Target Range  State the importance of logging glucose readings  Describe how to interpret glucose readings  Identifies A1C target  Explain the correlation between A1c and eAG values  State symptoms and treatment of high blood glucose  State symptoms and treatment of low blood glucose  Explain proper technique for glucose testing  Identifies proper sharps disposal  Handouts given during class include:  Living Well with Diabetes book  Carb Counting and Meal Planning book  Meal Plan Card  Carbohydrate guide  Meal planning worksheet  Low Sodium Flavoring Tips  The diabetes portion plate  W5L to eAG Conversion Chart  Diabetes Medications  Diabetes Recommended Care Schedule  Support Group  Diabetes Success Plan  Core Class Satisfaction Survey  Follow-Up Plan:  Attend core 2

## 2015-04-13 ENCOUNTER — Other Ambulatory Visit: Payer: Self-pay | Admitting: Infectious Diseases

## 2015-04-16 ENCOUNTER — Encounter: Payer: Medicare Other | Attending: Internal Medicine

## 2015-04-16 DIAGNOSIS — Z713 Dietary counseling and surveillance: Secondary | ICD-10-CM | POA: Diagnosis not present

## 2015-04-16 DIAGNOSIS — IMO0002 Reserved for concepts with insufficient information to code with codable children: Secondary | ICD-10-CM

## 2015-04-16 DIAGNOSIS — E118 Type 2 diabetes mellitus with unspecified complications: Secondary | ICD-10-CM | POA: Diagnosis not present

## 2015-04-16 DIAGNOSIS — E1165 Type 2 diabetes mellitus with hyperglycemia: Secondary | ICD-10-CM

## 2015-04-16 NOTE — Progress Notes (Signed)

## 2015-04-22 ENCOUNTER — Ambulatory Visit: Payer: Medicare Other | Attending: Family Medicine | Admitting: Pharmacist

## 2015-04-22 ENCOUNTER — Ambulatory Visit (HOSPITAL_BASED_OUTPATIENT_CLINIC_OR_DEPARTMENT_OTHER): Payer: Medicare Other

## 2015-04-22 DIAGNOSIS — E119 Type 2 diabetes mellitus without complications: Secondary | ICD-10-CM | POA: Diagnosis not present

## 2015-04-22 DIAGNOSIS — Z7984 Long term (current) use of oral hypoglycemic drugs: Secondary | ICD-10-CM | POA: Insufficient documentation

## 2015-04-22 LAB — GLUCOSE, POCT (MANUAL RESULT ENTRY): POC Glucose: 137 mg/dl — AB (ref 70–99)

## 2015-04-22 MED ORDER — TRUEPLUS LANCETS 28G MISC: Status: DC

## 2015-04-22 MED ORDER — GLIPIZIDE 10 MG PO TABS: 10.0000 mg | ORAL_TABLET | Freq: Two times a day (BID) | ORAL | Status: DC

## 2015-04-22 NOTE — Patient Instructions (Signed)
Thanks for coming to see me today!  Your blood sugars are so much better! Great job with the weight loss!!!  Follow up with Dr. Doreene Burke.

## 2015-04-22 NOTE — Progress Notes (Signed)
S:    Patient arrives in good spirits.  Presents for diabetes follow up.  Patient reports Diabetes was diagnosed in 2016.   Patient reports adherence with medications. Current diabetes medications include glipizide 10 mg BID. He never decreased the glipizide to 5 mg PO BID as instructed by Dr. Johnnye Sima.   Patient denies hypoglycemic events. Patient reports that he was confused when reading his meter and thought that the numbers were low but a friend who is a Marine scientist, taught him how to use the meter and he realized the the readings were actually in the 300s and 400s.   Patient reported dietary habits: patient has been working with a nutritionist and making healthier changes.  Patient reported exercise habits: has been exercising   Patient reports nocturia but it has greatly improved.  Patient denies neuropathy. Patient denies visual changes. Patient reports self foot exams.    O:  Lab Results  Component Value Date   HGBA1C 10.6 03/23/2015    Home fasting CBG: 119 - 162 (most <130). 2 hour post-prandial/random CBG: 126 - 198 (most <180).  POCT glucose = 137  A/P: Diabetes diagnosed in 2016 currently uncontrolled based on A1c of 10.6 but under improved control based on home CBG readings.   Patient denies hypoglycemic events and is able to verbalize appropriate hypoglycemia management plan.  Patient reports adherence with medication. Control is suboptimal due to dietary indiscretion and previous sedentary lifestyle. Medication reconciliation completed.  Continue glipizide 10 mg PO BID. No other changes to medications - patient is very motivated to continue weight loss and I think this will lead to a further decrease in his fasting blood glucoses. Encourage patient to continue to lose weight and congratulated him on his current progress. Patient instructed to return to see me if he sees his blood glucoses consistently elevated.   Next A1C anticipated December 2016.  Based on current  readings, expect it to be much improved.   Written patient instructions provided. Total time in face to face counseling 20 minutes.   Follow up in Pharmacist Clinic Visit as needed, next visit with Dr. Doreene Burke.

## 2015-04-23 DIAGNOSIS — Z713 Dietary counseling and surveillance: Secondary | ICD-10-CM | POA: Diagnosis not present

## 2015-04-23 DIAGNOSIS — IMO0002 Reserved for concepts with insufficient information to code with codable children: Secondary | ICD-10-CM

## 2015-04-23 DIAGNOSIS — E118 Type 2 diabetes mellitus with unspecified complications: Principal | ICD-10-CM

## 2015-04-23 DIAGNOSIS — E1165 Type 2 diabetes mellitus with hyperglycemia: Secondary | ICD-10-CM

## 2015-04-23 NOTE — Progress Notes (Signed)
Patient was seen on 04/23/15 for the third of a series of three diabetes self-management courses at the [facility].   Catalina Gravel the amount of activity recommended for healthy living . Describe activities suitable for individual needs . Identify ways to regularly incorporate activity into daily life . Identify barriers to activity and ways to over come these barriers  Identify diabetes medications being personally used and their primary action for lowering glucose and possible side effects . Describe role of stress on blood glucose and develop strategies to address psychosocial issues . Identify diabetes complications and ways to prevent them  Explain how to manage diabetes during illness . Evaluate success in meeting personal goal . Establish 2-3 goals that they will plan to diligently work on until they return for the  50-monthfollow-up visit  Goals:   I will count my carb choices at most meals and snacks  I will be active 40 minutes or more 3 times a week  Your patient has identified these potential barriers to change:  Motivation Taking time off  Your patient has identified their diabetes self-care support plan as  [facility] Family Support On-line Resources Plan:  Attend Optional Core 4 in 4 months

## 2015-04-28 ENCOUNTER — Ambulatory Visit (HOSPITAL_BASED_OUTPATIENT_CLINIC_OR_DEPARTMENT_OTHER): Payer: Medicare Other

## 2015-04-28 VITALS — BP 126/71 | HR 73 | Temp 98.8°F | Resp 14

## 2015-04-28 DIAGNOSIS — Z452 Encounter for adjustment and management of vascular access device: Secondary | ICD-10-CM | POA: Diagnosis not present

## 2015-04-28 DIAGNOSIS — C469 Kaposi's sarcoma, unspecified: Secondary | ICD-10-CM | POA: Diagnosis not present

## 2015-04-28 DIAGNOSIS — Z95828 Presence of other vascular implants and grafts: Secondary | ICD-10-CM

## 2015-04-28 MED ORDER — HEPARIN SOD (PORK) LOCK FLUSH 100 UNIT/ML IV SOLN
500.0000 [IU] | Freq: Once | INTRAVENOUS | Status: AC
  Administered 2015-04-28: 500 [IU] via INTRAVENOUS
  Filled 2015-04-28: qty 5

## 2015-04-28 MED ORDER — SODIUM CHLORIDE 0.9 % IJ SOLN
10.0000 mL | INTRAMUSCULAR | Status: DC | PRN
  Administered 2015-04-28: 10 mL via INTRAVENOUS
  Filled 2015-04-28: qty 10

## 2015-04-28 NOTE — Patient Instructions (Signed)

## 2015-04-28 NOTE — Progress Notes (Signed)
Only a small amount of blood drawing back from [facility].  Patient states this has happened in the past.  Patient to see Dr Benay Spice at next appt 06/09/15.  No labs ordered but patient states he always gets labs when he sees Dr. Bryan Lemma to patient we may have to use TPA at next appointment in order to get enough blood return for labs.

## 2015-04-30 DIAGNOSIS — H10413 Chronic giant papillary conjunctivitis, bilateral: Secondary | ICD-10-CM | POA: Diagnosis not present

## 2015-04-30 DIAGNOSIS — H05243 Constant exophthalmos, bilateral: Secondary | ICD-10-CM | POA: Diagnosis not present

## 2015-04-30 DIAGNOSIS — E032 Hypothyroidism due to medicaments and other exogenous substances: Secondary | ICD-10-CM | POA: Diagnosis not present

## 2015-04-30 DIAGNOSIS — E119 Type 2 diabetes mellitus without complications: Secondary | ICD-10-CM | POA: Diagnosis not present

## 2015-05-02 ENCOUNTER — Other Ambulatory Visit: Payer: Self-pay | Admitting: Allergy and Immunology

## 2015-05-07 ENCOUNTER — Ambulatory Visit: Payer: Medicare Other

## 2015-05-14 ENCOUNTER — Ambulatory Visit: Payer: Medicare Other

## 2015-05-14 ENCOUNTER — Other Ambulatory Visit: Payer: Self-pay | Admitting: Infectious Diseases

## 2015-06-09 ENCOUNTER — Telehealth: Payer: Self-pay | Admitting: Oncology

## 2015-06-09 ENCOUNTER — Ambulatory Visit (HOSPITAL_BASED_OUTPATIENT_CLINIC_OR_DEPARTMENT_OTHER): Payer: Medicare Other

## 2015-06-09 ENCOUNTER — Ambulatory Visit (HOSPITAL_BASED_OUTPATIENT_CLINIC_OR_DEPARTMENT_OTHER): Payer: Medicare Other | Admitting: Oncology

## 2015-06-09 VITALS — BP 140/79 | HR 67 | Temp 98.9°F | Resp 18 | Ht 73.0 in | Wt 298.1 lb

## 2015-06-09 DIAGNOSIS — C469 Kaposi's sarcoma, unspecified: Secondary | ICD-10-CM | POA: Diagnosis not present

## 2015-06-09 DIAGNOSIS — B20 Human immunodeficiency virus [HIV] disease: Secondary | ICD-10-CM | POA: Diagnosis not present

## 2015-06-09 DIAGNOSIS — N289 Disorder of kidney and ureter, unspecified: Secondary | ICD-10-CM | POA: Diagnosis not present

## 2015-06-09 DIAGNOSIS — Z95828 Presence of other vascular implants and grafts: Secondary | ICD-10-CM

## 2015-06-09 MED ORDER — HEPARIN SOD (PORK) LOCK FLUSH 100 UNIT/ML IV SOLN
500.0000 [IU] | Freq: Once | INTRAVENOUS | Status: AC
  Administered 2015-06-09: 500 [IU] via INTRAVENOUS
  Filled 2015-06-09: qty 5

## 2015-06-09 MED ORDER — SODIUM CHLORIDE 0.9 % IJ SOLN
10.0000 mL | INTRAMUSCULAR | Status: DC | PRN
  Administered 2015-06-09: 10 mL via INTRAVENOUS
  Filled 2015-06-09: qty 10

## 2015-06-09 NOTE — Progress Notes (Signed)
  Steven [name] OFFICE PROGRESS NOTE   Diagnosis: Kaposi's sarcoma  INTERVAL HISTORY:   He returns as scheduled. He feels well. He is trying to lose weight. No new skin lesions. He is interested in a water exercise program offered by [facility]. No pain. Good appetite.  Objective:  Vital signs in last 24 hours:  Blood pressure 140/79, pulse 67, temperature 98.9 F (37.2 C), temperature source Oral, resp. rate 18, height '6\' 1"'$  (1.854 m), weight 298 lb 1.6 oz (135.217 kg), SpO2 98 %.    HEENT: Neck without mass Lymphatics: No cervical, supra-clavicular, axillary, or inguinal nodes Resp: Lungs clear bilaterally Cardio: Regular rate and rhythm GI: No hepatomegaly, nontender Vascular: Edema at the low leg bilaterally with chronic stasis change  Skin: Dark brown raised less than 1 cm lesions at the left thigh and upper pretibial areas. Similar lesion at the right mid back   Portacath/PICC-without erythema  Lab Results:  Lab Results  Component Value Date   WBC 6.3 03/10/2015   HGB 15.7 03/10/2015   HCT 48.8 03/10/2015   MCV 81.9 03/10/2015   PLT 178 03/10/2015   NEUTROABS 4.0 03/13/2014     Medications: I have reviewed the patient's current medications.  Assessment/Plan: 1. 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   Status post Doxil (5 cycles 04/29/2009 through 09/22/2009 and then 3 cycles of paclitaxel 12/01/2009 through 02/09/2010.   Treatment subsequently placed on hold due to bilateral subdural hematomas.   CT scans 10/28/2013 showed a new prevascular mass with PET scan showing mild low level FDG uptake in the anterior mediastinal mass with more pronounced FDG accumulation within the dominant mesenteric lymph node.   Restaging PET scan 03/13/2014 showed a decrease in low level FDG uptake associated  with the prevascular mass; similar degree of FDG uptake associated with small mesenteric lymph nodes; persistent increased FDG uptake associated with the diffuse lytic bone metastasis. CT scan also 03/13/2014 showed interval decrease in the size of the anterior mediastinal mass, unchanged multiple small bowel mesenteric lymph nodes and re-demonstrated widespread lytic osseous metastasis. 2. HIV/AIDS followed by Dr. Johnnye Sima. He continues Abacavir-Dolutegravir-Lamivud.  3. History of bilateral subdural hematomas status post evacuation 03/05/2010. 4. Hypertension. 5. Renal dysfunction. 6. Asthma. 7. History of hyperthyroidism status post radioactive iodine November 2012 with subsequent hypothyroidism now on Synthroid. 8. Port-A-Cath placement 10/14/2009. 9. Lower extremity edema, bilateral, question lymphedema. He is followed at the lymphedema clinic. 10. Skin lesions. Status post evaluation by dermatology.     Disposition:  He remains in clinical remission from the [place]. He will contact us for new symptoms. [name] will return for a Port-A-Cath flush in 6 weeks and an office visit in 3 months. We will refer him to the water physical therapy program.  Betsy Coder, MD  06/09/2015  3:56 PM

## 2015-06-09 NOTE — Patient Instructions (Signed)

## 2015-06-09 NOTE — Telephone Encounter (Signed)
per pof to sch pt appt-gave pt copy of avs °

## 2015-06-26 ENCOUNTER — Telehealth: Payer: Self-pay | Admitting: Oncology

## 2015-06-26 ENCOUNTER — Other Ambulatory Visit: Payer: Self-pay | Admitting: *Deleted

## 2015-06-26 DIAGNOSIS — C469 Kaposi's sarcoma, unspecified: Secondary | ICD-10-CM

## 2015-06-26 NOTE — Telephone Encounter (Signed)
Called [place] (PT) house inside [facility] re appointment. Office closed - left message for Anderson Malta with patient information and asked that she contact me re appointment.  [place],  [phone]

## 2015-07-04 ENCOUNTER — Other Ambulatory Visit: Payer: Self-pay | Admitting: Allergy and Immunology

## 2015-07-07 ENCOUNTER — Other Ambulatory Visit: Payer: Self-pay | Admitting: Allergy and Immunology

## 2015-07-09 ENCOUNTER — Encounter: Payer: Self-pay | Admitting: Podiatry

## 2015-07-09 ENCOUNTER — Ambulatory Visit (INDEPENDENT_AMBULATORY_CARE_PROVIDER_SITE_OTHER): Payer: Medicare Other | Admitting: Podiatry

## 2015-07-09 DIAGNOSIS — B351 Tinea unguium: Secondary | ICD-10-CM

## 2015-07-09 DIAGNOSIS — M79673 Pain in unspecified foot: Secondary | ICD-10-CM

## 2015-07-09 DIAGNOSIS — M79676 Pain in unspecified toe(s): Secondary | ICD-10-CM

## 2015-07-10 NOTE — Progress Notes (Signed)
Patient ID: Steven [name], male   DOB: August 21, 1980, 34 y.o.   MRN: [id number]  Subjective: 34 y.o. returns the office today for painful, elongated, thickened toenails which he cannot trim himself. Denies any redness or drainage around the nails. Denies any acute changes since last appointment and no new complaints today. Denies any systemic complaints such as fevers, chills, nausea, vomiting.   Objective: AAO x3, NAD DP/PT pulses palpable bilaterally, CRT less than 3 seconds Protective sensation intact with Simms Weinstein monofilament Nails are hypertrophic, dystrophic, brittle, discolored, elongated 10. There is tenderness in nails 1-5 bilaterally. There is no surrounding erythema or drainage. There is chronic lower extremity edema with lymphedema changes bilaterally. There is xerotic skin to the plantar heels bilaterally. No bleeding or openings.  No areas of tenderness to bilateral lower extremities. MMT 5/5, ROM WNL.  No open lesions or other pre-ulcerative lesions bilaterally.  No overlying edema, erythema, increase in warmth to bilateral lower extremities.  No pain with calf compression, swelling, warmth, erythema bilaterally.   Assessment: Patient presents with symptomatic onychomycosis  Plan: -Treatment options including alternatives, risks, complications were discussed -Nails sharply debrided 10 without complication/bleeding. -Continue moisturizer daily for the heels. -Discussed daily foot inspection. If there are any changes, to call the office immediately.  -Follow-up in 3 months or sooner if any problems are to arise. In the meantime, encouraged to call the office with any questions, concerns, changes symptoms.  Celesta Gentile, DPM

## 2015-07-13 ENCOUNTER — Other Ambulatory Visit: Payer: Self-pay | Admitting: Allergy and Immunology

## 2015-07-30 ENCOUNTER — Other Ambulatory Visit: Payer: Self-pay | Admitting: Allergy and Immunology

## 2015-08-07 ENCOUNTER — Other Ambulatory Visit: Payer: Self-pay | Admitting: Endocrinology

## 2015-08-13 ENCOUNTER — Other Ambulatory Visit: Payer: Self-pay | Admitting: Allergy and Immunology

## 2015-08-17 ENCOUNTER — Telehealth: Payer: Self-pay | Admitting: Internal Medicine

## 2015-08-17 ENCOUNTER — Other Ambulatory Visit: Payer: Self-pay | Admitting: Internal Medicine

## 2015-08-17 DIAGNOSIS — E119 Type 2 diabetes mellitus without complications: Secondary | ICD-10-CM

## 2015-08-17 MED ORDER — GLIPIZIDE 10 MG PO TABS: 10.0000 mg | ORAL_TABLET | Freq: Two times a day (BID) | ORAL | Status: DC

## 2015-08-17 NOTE — Telephone Encounter (Signed)
Patient called requesting a refill for Glipizide. Patient stated that the dosage of medicine was changed by a doctor, Dr. Johnnye Sima because he thought patient was low blood sugar because patient was reading DM Meter incorrectly. Dr. Doreene Burke is the original  Pre scriber. Patient was once taking 2 whole tablets per day. The patient is needing a new prescription stating those directions,  to be filled at the [facility]. Dr. Johnnye Sima had changed it to half a tablet, twice a day. Patient is needing the switch back. Please follow up.

## 2015-08-17 NOTE — Telephone Encounter (Signed)
Medication has been refilled - the correct dose was on the patient chart.  Called patient and he is aware.

## 2015-08-27 ENCOUNTER — Encounter: Payer: Self-pay | Admitting: Internal Medicine

## 2015-08-27 ENCOUNTER — Ambulatory Visit: Payer: Medicare Other | Attending: Internal Medicine | Admitting: Internal Medicine

## 2015-08-27 VITALS — BP 135/83 | HR 75 | Temp 97.5°F | Resp 18 | Ht 73.0 in | Wt 299.0 lb

## 2015-08-27 DIAGNOSIS — Z79899 Other long term (current) drug therapy: Secondary | ICD-10-CM | POA: Diagnosis not present

## 2015-08-27 DIAGNOSIS — N183 Chronic kidney disease, stage 3 (moderate): Secondary | ICD-10-CM | POA: Insufficient documentation

## 2015-08-27 DIAGNOSIS — I10 Essential (primary) hypertension: Secondary | ICD-10-CM | POA: Insufficient documentation

## 2015-08-27 DIAGNOSIS — E119 Type 2 diabetes mellitus without complications: Secondary | ICD-10-CM | POA: Diagnosis not present

## 2015-08-27 DIAGNOSIS — Z880 Allergy status to penicillin: Secondary | ICD-10-CM | POA: Diagnosis not present

## 2015-08-27 DIAGNOSIS — Z888 Allergy status to other drugs, medicaments and biological substances status: Secondary | ICD-10-CM | POA: Insufficient documentation

## 2015-08-27 DIAGNOSIS — B20 Human immunodeficiency virus [HIV] disease: Secondary | ICD-10-CM | POA: Diagnosis not present

## 2015-08-27 DIAGNOSIS — Z859 Personal history of malignant neoplasm, unspecified: Secondary | ICD-10-CM | POA: Diagnosis not present

## 2015-08-27 DIAGNOSIS — J45909 Unspecified asthma, uncomplicated: Secondary | ICD-10-CM | POA: Insufficient documentation

## 2015-08-27 DIAGNOSIS — E1122 Type 2 diabetes mellitus with diabetic chronic kidney disease: Secondary | ICD-10-CM | POA: Diagnosis present

## 2015-08-27 LAB — GLUCOSE, POCT (MANUAL RESULT ENTRY): POC Glucose: 80 mg/dl (ref 70–99)

## 2015-08-27 LAB — POCT GLYCOSYLATED HEMOGLOBIN (HGB A1C): Hemoglobin A1C: 4.9

## 2015-08-27 MED ORDER — HYDROCHLOROTHIAZIDE 25 MG PO TABS: 25.0000 mg | ORAL_TABLET | Freq: Every day | ORAL | Status: DC

## 2015-08-27 MED ORDER — AMLODIPINE BESYLATE 10 MG PO TABS: 10.0000 mg | ORAL_TABLET | Freq: Every day | ORAL | Status: DC

## 2015-08-27 NOTE — Progress Notes (Signed)
Patient ID: Steven [name], male   DOB: 1980-09-05, 35 y.o.   MRN: [id number]   Steven [name], is a 35 y.o. male  EHU:[id number]  [id number]  DOB - 01-10-1981  Chief Complaint  Patient presents with  . Follow-up    A1C        Subjective:   Steven [name] is a 35 y.o. male here today for a follow up visit. Patient has history hypertension, hypothyroidism, DM T2, CKD stage 3, Kaposi's Sarcoma, and HIV. Patient has recently been diagnosed with DM T2, A1C 10.6% on 03/23/15 and is 4.9% today. Patient has no other complaints today. Patient has No headache, No chest pain, No abdominal pain - No Nausea, No new weakness tingling or numbness, No Cough - SOB. He is compliant with medication, diet and exercise. HIV viral load is consistently < 20 copies. He is asymptomatic.  No problems updated.  ALLERGIES: Allergies  Allergen Reactions  . Lisinopril Swelling    Swelling of lower lip while on lisinopril  . Penicillins Hives and Swelling    REACTION: rash and swelling  . Aspirin Nausea Only    PAST MEDICAL HISTORY: Past Medical History  Diagnosis Date  . HIV positive ([place]) 03/23/09    Genotype Y181C  . HTN (hypertension)   . Syphilis 03/23/09-    1:2  . Kaposi's sarcoma   . Cancer ([place])   . Asthma   . Diabetes mellitus without complication ([place])     MEDICATIONS AT HOME: Prior to Admission medications   Medication Sig Start Date End Date Taking? Authorizing Provider  acetaminophen (TYLENOL) 500 MG tablet Take 500 mg by mouth every 6 (six) hours as needed. Pain or fever   Yes Historical Provider, MD  albuterol (PROVENTIL HFA;VENTOLIN HFA) 108 (90 BASE) MCG/ACT inhaler Inhale 2 puffs into the lungs every 4 (four) hours as needed for wheezing or shortness of breath. 03/04/14 03/16/16 Yes Lance Bosch, NP  amLODipine (NORVASC) 10 MG tablet Take 1 tablet (10 mg total) by mouth daily. 08/27/15  Yes Tresa Garter, MD  Blood Glucose Monitoring Suppl (TRUE METRIX AIR GLUCOSE METER)  W/DEVICE KIT 1 application by Does not apply route 2 (two) times daily. 03/23/15  Yes Tresa Garter, MD  fexofenadine (ALLEGRA) 180 MG tablet Take 1 tablet (180 mg total) by mouth daily. 05/03/12  Yes Harden Mo, MD  fluconazole (DIFLUCAN) 200 MG tablet Take 1 tablet (200 mg total) by mouth daily. 03/23/15  Yes Tresa Garter, MD  fluticasone (FLONASE) 50 MCG/ACT nasal spray  03/17/15  Yes Historical Provider, MD  glucose blood (TRUE METRIX BLOOD GLUCOSE TEST) test strip Use as instructed 03/23/15  Yes Asaiah Scarber E Doreene Burke, MD  hydrochlorothiazide (HYDRODIURIL) 25 MG tablet Take 1 tablet (25 mg total) by mouth daily. 08/27/15  Yes Tresa Garter, MD  levothyroxine (SYNTHROID, LEVOTHROID) 150 MCG tablet Take 1 tablet (150 mcg total) by mouth daily. 11/13/14  Yes Elayne Snare, MD  lidocaine-prilocaine (EMLA) cream Apply 1 application topically as needed. Apply topically to port a cath 30 min to 1 hour before access. 06/06/14  Yes Owens Shark, NP  montelukast (SINGULAIR) 10 MG tablet TAKE 1 TABLET BY MOUTH EVERY DAY AS DIRECTED 08/13/15  Yes Jiles Prows, MD  Multiple Vitamin (MULTIVITAMIN) tablet Take 1 tablet by mouth daily.   Yes Historical Provider, MD  potassium chloride SA (K-DUR,KLOR-CON) 20 MEQ tablet TAKE 1 TABLET BY MOUTH DAILY 04/21/14  Yes Owens Shark, NP  QVAR 80 MCG/ACT inhaler  INHALE 2 PUFFS BY MOUTH TWICE DAILY TO PREVENT COUGH OR WHEEZE, RINSE, GARGLE, AND SPIT AFTER USE 07/30/15  Yes Eric J Kozlow, MD  TRIUMEQ 600-50-300 MG TABS TAKE 1 TABLET BY MOUTH EVERY DAY 05/14/15  Yes Jeffrey C Hatcher, MD  TRUEPLUS LANCETS 28G MISC Use as directed for once daily testing. 04/22/15  Yes Olugbemiga E Jegede, MD     Objective:   Filed Vitals:   08/27/15 1011  BP: 135/83  Pulse: 75  Temp: 97.5 F (36.4 C)  TempSrc: Oral  Resp: 18  Height: 6' 1" (1.854 m)  Weight: 299 lb (135.626 kg)  SpO2: 99%    Exam General appearance : Awake, alert, not in any distress. Speech Clear.  Not toxic looking, obese HEENT: Atraumatic and Normocephalic, pupils equally reactive to light and accomodation Neck: supple, no JVD. No cervical lymphadenopathy.  Chest:Good air entry bilaterally, no added sounds  CVS: S1 S2 regular, no murmurs.  Abdomen: Bowel sounds present, Non tender and not distended with no gaurding, rigidity or rebound. Extremities: B/L Lower Ext shows no edema, both legs are warm to touch Neurology: Awake alert, and oriented X 3, CN II-XII intact, Non focal Skin:No Rash  Data Review Lab Results  Component Value Date   HGBA1C 4.90 08/27/2015   HGBA1C 10.6 03/23/2015     Assessment & Plan   1. Type 2 diabetes mellitus without complication, without long-term current use of insulin (HCC)  - POCT A1C - Glucose (CBG) Discontinue Glipizide Continue diet and exercise control Repeat A1C in 3 months, if > 6.5%, restart oral hypoglycemics  Aim for 30 minutes of exercise most days. Rethink what you drink. Water is great! Aim for 2-3 Carb Choices per meal (30-45 grams) +/- 1 either way  Aim for 0-15 Carbs per snack if hungry  Include protein in moderation with your meals and snacks  Consider reading food labels for Total Carbohydrate and Fat Grams of foods  Be mindful about how much sugar you are adding to beverages and other foods. Fruit Punch - find one with no sugar  Measure and decrease portions of carbohydrate foods  Make your plate and don't go back for seconds   2. Essential hypertension  - hydrochlorothiazide (HYDRODIURIL) 25 MG tablet; Take 1 tablet (25 mg total) by mouth daily.  Dispense: 90 tablet; Refill: 3 - amLODipine (NORVASC) 10 MG tablet; Take 1 tablet (10 mg total) by mouth daily.  Dispense: 90 tablet; Refill: 3  We have discussed target BP range and blood pressure goal. I have advised patient to check BP regularly and to call us back or report to clinic if the numbers are consistently higher than 140/90. We discussed the importance of  compliance with medical therapy and DASH diet recommended, consequences of uncontrolled hypertension discussed.   - continue current BP medications Patient have been counseled extensively about nutrition and exercise  Return in about 3 months (around 11/24/2015) for Hemoglobin A1C and Follow up, DM, Follow up HTN.  The patient was given clear instructions to go to ER or return to medical center if symptoms don't improve, worsen or new problems develop. The patient verbalized understanding. The patient was told to call to get lab results if they haven't heard anything in the next week.   This note has been created with Dragon speech recognition software and smart phrase technology. Any transcriptional errors are unintentional.    JEGEDE, OLUGBEMIGA, MD, MHA, FACP, FAAP, CPE [facility] [place], [place] [phone]     08/27/2015, 10:54 AM 

## 2015-08-27 NOTE — Progress Notes (Signed)
Patient is here for FU A1C  Patient denies pain at this time.

## 2015-08-27 NOTE — Patient Instructions (Signed)
Exercising to Lose Weight Exercising can help you to lose weight. In order to lose weight through exercise, you need to do vigorous-intensity exercise. You can tell that you are exercising with vigorous intensity if you are breathing very hard and fast and cannot hold a conversation while exercising. Moderate-intensity exercise helps to maintain your current weight. You can tell that you are exercising at a moderate level if you have a higher heart rate and faster breathing, but you are still able to hold a conversation. HOW OFTEN SHOULD I EXERCISE? Choose an activity that you enjoy and set realistic goals. Your health care provider can help you to make an activity plan that works for you. Exercise regularly as directed by your health care provider. This may include:  Doing resistance training twice each week, such as:  Push-ups.  Sit-ups.  Lifting weights.  Using resistance bands.  Doing a given intensity of exercise for a given amount of time. Choose from these options:  150 minutes of moderate-intensity exercise every week.  75 minutes of vigorous-intensity exercise every week.  A mix of moderate-intensity and vigorous-intensity exercise every week. Children, pregnant women, people who are out of shape, people who are overweight, and older adults may need to consult a health care provider for individual recommendations. If you have any sort of medical condition, be sure to consult your health care provider before starting a new exercise program. WHAT ARE SOME ACTIVITIES THAT CAN HELP ME TO LOSE WEIGHT?   Walking at a rate of at least 4.5 miles an hour.  Jogging or running at a rate of 5 miles per hour.  Biking at a rate of at least 10 miles per hour.  Lap swimming.  Roller-skating or in-line skating.  Cross-country skiing.  Vigorous competitive sports, such as football, basketball, and soccer.  Jumping rope.  Aerobic dancing. HOW CAN I BE MORE ACTIVE IN MY DAY-TO-DAY  ACTIVITIES?  Use the stairs instead of the elevator.  Take a walk during your lunch break.  If you drive, park your car farther away from work or school.  If you take public transportation, get off one stop early and walk the rest of the way.  Make all of your phone calls while standing up and walking around.  Get up, stretch, and walk around every 30 minutes throughout the day. WHAT GUIDELINES SHOULD I FOLLOW WHILE EXERCISING?  Do not exercise so much that you hurt yourself, feel dizzy, or get very short of breath.  Consult your health care provider prior to starting a new exercise program.  Wear comfortable clothes and shoes with good support.  Drink plenty of water while you exercise to prevent dehydration or heat stroke. Body water is lost during exercise and must be replaced.  Work out until you breathe faster and your heart beats faster.   This information is not intended to replace advice given to you by your health care provider. Make sure you discuss any questions you have with your health care provider.   Document Released: 07/30/2010 Document Revised: 07/18/2014 Document Reviewed: 11/28/2013 Elsevier Interactive Patient Education 2016 [place] DASH stands for "Dietary Approaches to Stop Hypertension." The DASH eating plan is a healthy eating plan that has been shown to reduce high blood pressure (hypertension). Additional health benefits may include reducing the risk of type 2 diabetes mellitus, heart disease, and stroke. The DASH eating plan may also help with weight loss. WHAT DO I NEED TO KNOW ABOUT THE DASH EATING  PLAN? For the DASH eating plan, you will follow these general guidelines:  Choose foods with a percent daily value for sodium of less than 5% (as listed on the food label).  Use salt-free seasonings or herbs instead of table salt or sea salt.  Check with your health care provider or pharmacist before using salt substitutes.  Eat  lower-sodium products, often labeled as "lower sodium" or "no salt added."  Eat fresh foods.  Eat more vegetables, fruits, and low-fat dairy products.  Choose whole grains. Look for the word "whole" as the first word in the ingredient list.  Choose fish and skinless chicken or [place] more often than red meat. Limit fish, poultry, and meat to 6 oz (170 g) each day.  Limit sweets, desserts, sugars, and sugary drinks.  Choose heart-healthy fats.  Limit cheese to 1 oz (28 g) per day.  Eat more home-cooked food and less restaurant, buffet, and fast food.  Limit fried foods.  Cook foods using methods other than frying.  Limit canned vegetables. If you do use them, rinse them well to decrease the sodium.  When eating at a restaurant, ask that your food be prepared with less salt, or no salt if possible. WHAT FOODS CAN I EAT? Seek help from a dietitian for individual calorie needs. Grains Whole grain or whole wheat bread. Brown rice. Whole grain or whole wheat pasta. Quinoa, bulgur, and whole grain cereals. Low-sodium cereals. Corn or whole wheat flour tortillas. Whole grain cornbread. Whole grain crackers. Low-sodium crackers. Vegetables Fresh or frozen vegetables (raw, steamed, roasted, or grilled). Low-sodium or reduced-sodium tomato and vegetable juices. Low-sodium or reduced-sodium tomato sauce and paste. Low-sodium or reduced-sodium canned vegetables.  Fruits All fresh, canned (in natural juice), or frozen fruits. Meat and Other Protein Products Ground beef (85% or leaner), grass-fed beef, or beef trimmed of fat. Skinless chicken or [place]. Ground chicken or [place]. Pork trimmed of fat. All fish and seafood. Eggs. Dried beans, peas, or lentils. Unsalted nuts and seeds. Unsalted canned beans. Dairy Low-fat dairy products, such as skim or 1% milk, 2% or reduced-fat cheeses, low-fat ricotta or cottage cheese, or plain low-fat yogurt. Low-sodium or reduced-sodium cheeses. Fats and  Oils Tub margarines without trans fats. Light or reduced-fat mayonnaise and salad dressings (reduced sodium). Avocado. Safflower, olive, or canola oils. Natural peanut or almond butter. Other Unsalted popcorn and pretzels. The items listed above may not be a complete list of recommended foods or beverages. Contact your dietitian for more options. WHAT FOODS ARE NOT RECOMMENDED? Grains White bread. White pasta. White rice. Refined cornbread. Bagels and croissants. Crackers that contain trans fat. Vegetables Creamed or fried vegetables. Vegetables in a cheese sauce. Regular canned vegetables. Regular canned tomato sauce and paste. Regular tomato and vegetable juices. Fruits Dried fruits. Canned fruit in light or heavy syrup. Fruit juice. Meat and Other Protein Products Fatty cuts of meat. Ribs, chicken wings, bacon, sausage, bologna, salami, chitterlings, fatback, hot dogs, bratwurst, and packaged luncheon meats. Salted nuts and seeds. Canned beans with salt. Dairy Whole or 2% milk, cream, half-and-half, and cream cheese. Whole-fat or sweetened yogurt. Full-fat cheeses or blue cheese. Nondairy creamers and whipped toppings. Processed cheese, cheese spreads, or cheese curds. Condiments Onion and garlic salt, seasoned salt, table salt, and sea salt. Canned and packaged gravies. Worcestershire sauce. Tartar sauce. Barbecue sauce. Teriyaki sauce. Soy sauce, including reduced sodium. Steak sauce. Fish sauce. Oyster sauce. Cocktail sauce. Horseradish. Ketchup and mustard. Meat flavorings and tenderizers. Bouillon cubes. Hot sauce. [place]  sauce. Marinades. Taco seasonings. Relishes. Fats and Oils Butter, stick margarine, lard, shortening, ghee, and bacon fat. Coconut, palm kernel, or palm oils. Regular salad dressings. Other Pickles and olives. Salted popcorn and pretzels. The items listed above may not be a complete list of foods and beverages to avoid. Contact your dietitian for more  information. WHERE CAN I FIND MORE INFORMATION? National Heart, Lung, and Blood Institute: travelstabloid.com   This information is not intended to replace advice given to you by your health care provider. Make sure you discuss any questions you have with your health care provider.   Document Released: 06/16/2011 Document Revised: 07/18/2014 Document Reviewed: 05/01/2013 Elsevier Interactive Patient Education 2016 [employer]. Hypertension Hypertension, commonly called high blood pressure, is when the force of blood pumping through your arteries is too strong. Your arteries are the blood vessels that carry blood from your heart throughout your body. A blood pressure reading consists of a higher number over a lower number, such as 110/72. The higher number (systolic) is the pressure inside your arteries when your heart pumps. The lower number (diastolic) is the pressure inside your arteries when your heart relaxes. Ideally you want your blood pressure below 120/80. Hypertension forces your heart to work harder to pump blood. Your arteries may become narrow or stiff. Having untreated or uncontrolled hypertension can cause heart attack, stroke, kidney disease, and other problems. RISK FACTORS Some risk factors for high blood pressure are controllable. Others are not.  Risk factors you cannot control include:   Race. You may be at higher risk if you are African American.  Age. Risk increases with age.  Gender. Men are at higher risk than women before age 3 years. After age 39, women are at higher risk than men. Risk factors you can control include:  Not getting enough exercise or physical activity.  Being overweight.  Getting too much fat, sugar, calories, or salt in your diet.  Drinking too much alcohol. SIGNS AND SYMPTOMS Hypertension does not usually cause signs or symptoms. Extremely high blood pressure (hypertensive crisis) may cause headache,  anxiety, shortness of breath, and nosebleed. DIAGNOSIS To check if you have hypertension, your health care provider will measure your blood pressure while you are seated, with your arm held at the level of your heart. It should be measured at least twice using the same arm. Certain conditions can cause a difference in blood pressure between your right and left arms. A blood pressure reading that is higher than normal on one occasion does not mean that you need treatment. If it is not clear whether you have high blood pressure, you may be asked to return on a different day to have your blood pressure checked again. Or, you may be asked to monitor your blood pressure at home for 1 or more weeks. TREATMENT Treating high blood pressure includes making lifestyle changes and possibly taking medicine. Living a healthy lifestyle can help lower high blood pressure. You may need to change some of your habits. Lifestyle changes may include:  Following the DASH diet. This diet is high in fruits, vegetables, and whole grains. It is low in salt, red meat, and added sugars.  Keep your sodium intake below 2,300 mg per day.  Getting at least 30-45 minutes of aerobic exercise at least 4 times per week.  Losing weight if necessary.  Not smoking.  Limiting alcoholic beverages.  Learning ways to reduce stress. Your health care provider may prescribe medicine if lifestyle changes are not enough  to get your blood pressure under control, and if one of the following is true:  You are 57-43 years of age and your systolic blood pressure is above 140.  You are 69 years of age or older, and your systolic blood pressure is above 150.  Your diastolic blood pressure is above 90.  You have diabetes, and your systolic blood pressure is over 740 or your diastolic blood pressure is over 90.  You have kidney disease and your blood pressure is above 140/90.  You have heart disease and your blood pressure is above  140/90. Your personal target blood pressure may vary depending on your medical conditions, your age, and other factors. HOME CARE INSTRUCTIONS  Have your blood pressure rechecked as directed by your health care provider.   Take medicines only as directed by your health care provider. Follow the directions carefully. Blood pressure medicines must be taken as prescribed. The medicine does not work as well when you skip doses. Skipping doses also puts you at risk for problems.  Do not smoke.   Monitor your blood pressure at home as directed by your health care provider. SEEK MEDICAL CARE IF:   You think you are having a reaction to medicines taken.  You have recurrent headaches or feel dizzy.  You have swelling in your ankles.  You have trouble with your vision. SEEK IMMEDIATE MEDICAL CARE IF:  You develop a severe headache or confusion.  You have unusual weakness, numbness, or feel faint.  You have severe chest or abdominal pain.  You vomit repeatedly.  You have trouble breathing. MAKE SURE YOU:   Understand these instructions.  Will watch your condition.  Will get help right away if you are not doing well or get worse.   This information is not intended to replace advice given to you by your health care provider. Make sure you discuss any questions you have with your health care provider.   Document Released: 06/27/2005 Document Revised: 11/11/2014 Document Reviewed: 04/19/2013 Elsevier Interactive Patient Education [employer].

## 2015-09-01 ENCOUNTER — Ambulatory Visit: Payer: Medicare Other | Admitting: Nurse Practitioner

## 2015-09-12 ENCOUNTER — Other Ambulatory Visit: Payer: Self-pay | Admitting: Allergy and Immunology

## 2015-09-13 ENCOUNTER — Other Ambulatory Visit: Payer: Self-pay | Admitting: Internal Medicine

## 2015-10-08 ENCOUNTER — Ambulatory Visit: Payer: Medicare Other | Admitting: Podiatry

## 2015-10-09 ENCOUNTER — Other Ambulatory Visit: Payer: Self-pay | Admitting: Allergy and Immunology

## 2015-10-27 ENCOUNTER — Other Ambulatory Visit: Payer: Self-pay | Admitting: Internal Medicine

## 2015-10-29 ENCOUNTER — Encounter: Payer: Self-pay | Admitting: Podiatry

## 2015-10-29 ENCOUNTER — Ambulatory Visit (INDEPENDENT_AMBULATORY_CARE_PROVIDER_SITE_OTHER): Payer: Medicare Other | Admitting: Podiatry

## 2015-10-29 DIAGNOSIS — M79676 Pain in unspecified toe(s): Secondary | ICD-10-CM

## 2015-10-29 DIAGNOSIS — B351 Tinea unguium: Secondary | ICD-10-CM | POA: Diagnosis not present

## 2015-10-29 NOTE — Progress Notes (Signed)
Patient ID: Steven [name], male   DOB: [birth year], 35 y.o.   MRN: [id number]  Subjective: 35 y.o. returns the office today for painful, elongated, thickened toenails which he cannot trim himself. Denies any redness or drainage around the nails. Denies any acute changes since last appointment and no new complaints today. Denies any systemic complaints such as fevers, chills, nausea, vomiting.   Objective: AAO x3, NAD DP/PT pulses palpable bilaterally, CRT less than 3 seconds Protective sensation intact with Simms Weinstein monofilament Nails are hypertrophic, dystrophic, brittle, discolored, elongated 10. There is tenderness in nails 1-5 bilaterally. There is no surrounding erythema or drainage. There is chronic lower extremity edema with lymphedema changes bilaterally.  No areas of tenderness to bilateral lower extremities. MMT 5/5, ROM WNL.  No open lesions or other pre-ulcerative lesions bilaterally.  No overlying edema, erythema, increase in warmth to bilateral lower extremities.  No pain with calf compression, swelling, warmth, erythema bilaterally.   Assessment: Patient presents with symptomatic onychomycosis  Plan: -Treatment options including alternatives, risks, complications were discussed -Nails sharply debrided 10 without complication/bleeding. -Discussed daily foot inspection. If there are any changes, to call the office immediately.  -Follow-up in 3 months or sooner if any problems are to arise. In the meantime, encouraged to call the office with any questions, concerns, changes symptoms.  Celesta Gentile, DPM

## 2015-12-05 ENCOUNTER — Other Ambulatory Visit: Payer: Self-pay | Admitting: Internal Medicine

## 2015-12-08 ENCOUNTER — Encounter: Payer: Self-pay | Admitting: Allergy and Immunology

## 2015-12-08 ENCOUNTER — Ambulatory Visit (INDEPENDENT_AMBULATORY_CARE_PROVIDER_SITE_OTHER): Payer: Medicare Other | Admitting: Allergy and Immunology

## 2015-12-08 VITALS — BP 140/100 | HR 88 | Resp 20

## 2015-12-08 DIAGNOSIS — K219 Gastro-esophageal reflux disease without esophagitis: Secondary | ICD-10-CM

## 2015-12-08 DIAGNOSIS — B20 Human immunodeficiency virus [HIV] disease: Secondary | ICD-10-CM

## 2015-12-08 DIAGNOSIS — J4551 Severe persistent asthma with (acute) exacerbation: Secondary | ICD-10-CM | POA: Diagnosis not present

## 2015-12-08 DIAGNOSIS — Z21 Asymptomatic human immunodeficiency virus [HIV] infection status: Secondary | ICD-10-CM

## 2015-12-08 DIAGNOSIS — J309 Allergic rhinitis, unspecified: Secondary | ICD-10-CM | POA: Diagnosis not present

## 2015-12-08 DIAGNOSIS — H1010 Acute atopic conjunctivitis, unspecified eye: Secondary | ICD-10-CM | POA: Diagnosis not present

## 2015-12-08 MED ORDER — MONTELUKAST SODIUM 10 MG PO TABS: 10.0000 mg | ORAL_TABLET | Freq: Every day | ORAL | Status: DC

## 2015-12-08 MED ORDER — FLUTICASONE PROPIONATE 50 MCG/ACT NA SUSP: 2.0000 | Freq: Every day | NASAL | Status: DC

## 2015-12-08 MED ORDER — METHYLPREDNISOLONE ACETATE 80 MG/ML IJ SUSP
80.0000 mg | Freq: Once | INTRAMUSCULAR | Status: AC
  Administered 2015-12-08: 80 mg via INTRAMUSCULAR

## 2015-12-08 MED ORDER — FLUTICASONE-SALMETEROL 230-21 MCG/ACT IN AERO: 2.0000 | INHALATION_SPRAY | Freq: Two times a day (BID) | RESPIRATORY_TRACT | Status: DC

## 2015-12-08 MED ORDER — ALBUTEROL SULFATE HFA 108 (90 BASE) MCG/ACT IN AERS: 2.0000 | INHALATION_SPRAY | RESPIRATORY_TRACT | Status: DC | PRN

## 2015-12-08 MED ORDER — ESOMEPRAZOLE MAGNESIUM 40 MG PO CPDR: 40.0000 mg | DELAYED_RELEASE_CAPSULE | Freq: Every day | ORAL | Status: DC

## 2015-12-08 NOTE — Patient Instructions (Addendum)
  1. Obtain blood tests for nucala or Xolair qualification:CBC w/diff + IgE  2. Depo-Medrol 80 IM delivered in clinic today  3. Use a combination of Advair 230 2 inhalations twice a day and Qvar 80 2 inhalations twice a day  4. Use montelukast 10 mg daily  5. Continue Flonase 1-2 sprays each nostril one time per day  6. Continue Nexium 40 mg once a day if needed  7. Continue ophthalmology prescribed eyedrops as needed  8. Continue Ventolin HFA 2 puffs every 4-6 hours if needed  9. Continue fexofenadine/Allegra 180 one time per day if needed  10. Return to clinic in 2 weeks or earlier if problem

## 2015-12-08 NOTE — Progress Notes (Signed)
Follow-up Note  Referring Provider: Tresa Garter, MD Primary Provider: Angelica Chessman, MD Date of Office Visit: 12/08/2015  Subjective:   Steven [name] (DOB: 1981/04/13) is a 35 y.o. male who returns to the [facility] on 12/08/2015 in re-evaluation of the following:  HPI: Ac returns to this clinic in evaluation of his asthma and allergic rhinoconjunctivitis. I've not seen him in his clinic since June 2016.  During the interval he appears to be doing relatively well but unfortunately over the course of the past 3 weeks has once again developed very significant problems with sneezing and coughing and wheezing and using his bronchodilator twice a day and needing to use steroid eyedrops administered by his ophthalmologist. He is now using Qvar 80 one inhalation twice a day and is no longer on Advair and does not use Flonase on a regular basis. During his last visit with me I did have a discussion with him about the possibility of going on a biological agent to allow better control of his atopic disease while not using medications that may interact with his antiretroviral medications.    Medication List           acetaminophen 500 MG tablet  Commonly known as:  TYLENOL  Take 500 mg by mouth every 6 (six) hours as needed. Pain or fever     albuterol 108 (90 Base) MCG/ACT inhaler  Commonly known as:  VENTOLIN HFA  Inhale 2 puffs into the lungs every 4 (four) hours as needed for wheezing or shortness of breath.     amLODipine 10 MG tablet  Commonly known as:  NORVASC  Take 1 tablet (10 mg total) by mouth daily.     esomeprazole 40 MG capsule  Commonly known as:  NEXIUM  Take 1 capsule (40 mg total) by mouth daily at 12 noon.     fexofenadine 180 MG tablet  Commonly known as:  ALLEGRA  Take 1 tablet (180 mg total) by mouth daily.     fluorometholone 0.1 % ophthalmic suspension  Commonly known as:  FML  Place 1 drop into both eyes 2 (two) times  daily.     fluticasone 50 MCG/ACT nasal spray  Commonly known as:  FLONASE  Place 2 sprays into both nostrils daily.     fluticasone-salmeterol 230-21 MCG/ACT inhaler  Commonly known as:  ADVAIR HFA  Inhale 2 puffs into the lungs 2 (two) times daily.     hydrochlorothiazide 25 MG tablet  Commonly known as:  HYDRODIURIL  Take 1 tablet (25 mg total) by mouth daily.     levothyroxine 150 MCG tablet  Commonly known as:  SYNTHROID, LEVOTHROID  Take 1 tablet (150 mcg total) by mouth daily.     lidocaine-prilocaine cream  Commonly known as:  EMLA  Apply 1 application topically as needed. Apply topically to port a cath 30 min to 1 hour before access.     montelukast 10 MG tablet  Commonly known as:  SINGULAIR  Take 1 tablet (10 mg total) by mouth daily.     multivitamin tablet  Take 1 tablet by mouth daily.     potassium chloride SA 20 MEQ tablet  Commonly known as:  K-DUR,KLOR-CON  TAKE 1 TABLET BY MOUTH DAILY     QVAR 80 MCG/ACT inhaler  Generic drug:  beclomethasone  INHALE 2 PUFFS BY MOUTH TWICE DAILY TO PREVENT COUGH OR WHEEZE, RINSE, GARGLE, AND SPIT AFTER USE     TRIUMEQ 600-50-300 MG  tablet  Generic drug:  abacavir-dolutegravir-lamiVUDine  TAKE 1 TABLET BY MOUTH EVERY DAY        Past Medical History  Diagnosis Date  . 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 HTN (hypertension)   . Syphilis 03/23/09-    1:2  . Kaposi's sarcoma   . Cancer ([place])   . Asthma   . Diabetes mellitus without complication [facility])     Past Surgical History  Procedure Laterality Date  . Brain surgery      Allergies  Allergen Reactions  . Lisinopril Swelling    Swelling of lower lip while on lisinopril  . Penicillins Hives and Swelling    REACTION: rash and swelling  . Aspirin Nausea Only    Review of systems negative except as noted in HPI / PMHx or noted below:  Review of Systems  Constitutional: Negative.   HENT: Negative.   Eyes: Negative.   Respiratory:  Negative.   Cardiovascular: Negative.   Gastrointestinal: Negative.   Genitourinary: Negative.   Musculoskeletal: Negative.   Skin: Negative.   Neurological: Negative.   Endo/Heme/Allergies: Negative.   Psychiatric/Behavioral: Negative.      Objective:   Filed Vitals:   12/08/15 1750  BP: 140/100  Pulse: 88  Resp: 20          Physical Exam  Constitutional: He is well-developed, well-nourished, and in no distress.  HENT:  Head: Normocephalic.  Right Ear: Tympanic membrane, external ear and ear canal normal.  Left Ear: Tympanic membrane, external ear and ear canal normal.  Nose: Mucosal edema present. No rhinorrhea.  Mouth/Throat: Uvula is midline, oropharynx is clear and moist and mucous membranes are normal. No oropharyngeal exudate.  Eyes: Right conjunctiva is injected. Left conjunctiva is injected.  Neck: Trachea normal. No tracheal tenderness present. No tracheal deviation present. No thyromegaly present.  Cardiovascular: Normal rate, regular rhythm, S1 normal, S2 normal and normal heart sounds.   No murmur heard. Pulmonary/Chest: No stridor. No respiratory distress. He has wheezes (End expiratory wheezes). He has no rales.  Musculoskeletal: He exhibits no edema.  Lymphadenopathy:       Head (right side): No tonsillar adenopathy present.       Head (left side): No tonsillar adenopathy present.    He has no cervical adenopathy.  Neurological: He is alert. Gait normal.  Skin: No rash noted. He is not diaphoretic. No erythema. Nails show no clubbing.  Psychiatric: Mood and affect normal.    Diagnostics:    Spirometry was performed and demonstrated an FEV1 of 1.55 at 38 % of predicted.  The patient had an Asthma Control Test with the following results: ACT Total Score: 17.    Assessment and Plan:   1. Asthma, not well controlled, severe persistent, with acute exacerbation   2. Allergic rhinoconjunctivitis   3. Gastroesophageal reflux disease, esophagitis  presence not specified   4. HIV (human immunodeficiency virus infection) ([place])     1. Obtain blood tests for nucala or Xolair qualification: CBC w/diff + IgE  2. Depo-Medrol 80 IM delivered in clinic today  3. Use a combination of Advair 230 2 inhalations twice a day and Qvar 80 2 inhalations twice a day  4. Use montelukast 10 mg daily  5. Continue Flonase 1-2 sprays each nostril one time per day  6. Continue Nexium 40 mg once a day if needed  7. Continue ophthalmology prescribed eyedrops as needed  8. Continue Ventolin HFA 2 puffs every 4-6 hours if needed  9. Continue fexofenadine/Allegra 180 one time per day if needed  10. Return to clinic in 2 weeks or earlier if problem   I think in the long run [place] would do better using a biological agent for his atopic disease whether that be nucala or Xolair. When I last had a discussion with him about this issue he was somewhat worried about the cancer risk associated with Xolair administration, given his history of Kaposi's sarcoma, which has for the most part been disproven with long-term studies. Given the type of insurance he possesses and the expense of these agents Xolair may be a better fit because there is a patient advocacy group that will end up paying the 20% cost that he will incur given that he has Medicaid. There is no such program for nucala. For now he will use a very large collection of anti-inflammatory treatment including systemic steroids and I'll see him back in this clinic in 2 weeks or earlier if there is a problem.  Allena Katz, MD [place]

## 2015-12-11 ENCOUNTER — Other Ambulatory Visit: Payer: Self-pay | Admitting: Endocrinology

## 2015-12-12 ENCOUNTER — Other Ambulatory Visit: Payer: Self-pay | Admitting: Infectious Diseases

## 2015-12-12 DIAGNOSIS — B20 Human immunodeficiency virus [HIV] disease: Secondary | ICD-10-CM

## 2015-12-17 LAB — CBC WITH DIFFERENTIAL/PLATELET
BASOS ABS: 0 10*3/uL (ref 0.0–0.2)
Basos: 1 %
EOS (ABSOLUTE): 0.1 10*3/uL (ref 0.0–0.4)
Eos: 2 %
HEMATOCRIT: 49.3 % (ref 37.5–51.0)
HEMOGLOBIN: 15.7 g/dL (ref 12.6–17.7)
Immature Grans (Abs): 0 10*3/uL (ref 0.0–0.1)
Immature Granulocytes: 1 %
LYMPHS ABS: 2.1 10*3/uL (ref 0.7–3.1)
Lymphs: 34 %
MCH: 25.6 pg — AB (ref 26.6–33.0)
MCHC: 31.8 g/dL (ref 31.5–35.7)
MCV: 80 fL (ref 79–97)
MONOS ABS: 0.2 10*3/uL (ref 0.1–0.9)
Monocytes: 4 %
NEUTROS ABS: 3.7 10*3/uL (ref 1.4–7.0)
Neutrophils: 58 %
Platelets: 174 10*3/uL (ref 150–379)
RBC: 6.13 x10E6/uL — AB (ref 4.14–5.80)
RDW: 14.8 % (ref 12.3–15.4)
WBC: 6.3 10*3/uL (ref 3.4–10.8)

## 2015-12-17 LAB — IGE: IGE (IMMUNOGLOBULIN E), SERUM: 252 [IU]/mL — AB (ref 0–100)

## 2015-12-22 ENCOUNTER — Ambulatory Visit (INDEPENDENT_AMBULATORY_CARE_PROVIDER_SITE_OTHER): Payer: Medicare Other | Admitting: Allergy and Immunology

## 2015-12-22 ENCOUNTER — Other Ambulatory Visit (INDEPENDENT_AMBULATORY_CARE_PROVIDER_SITE_OTHER): Payer: Medicare Other

## 2015-12-22 ENCOUNTER — Other Ambulatory Visit: Payer: Self-pay | Admitting: Endocrinology

## 2015-12-22 ENCOUNTER — Encounter: Payer: Self-pay | Admitting: Allergy and Immunology

## 2015-12-22 VITALS — BP 142/100 | HR 76 | Resp 20

## 2015-12-22 DIAGNOSIS — R739 Hyperglycemia, unspecified: Secondary | ICD-10-CM

## 2015-12-22 DIAGNOSIS — J4550 Severe persistent asthma, uncomplicated: Secondary | ICD-10-CM | POA: Diagnosis not present

## 2015-12-22 DIAGNOSIS — B20 Human immunodeficiency virus [HIV] disease: Secondary | ICD-10-CM

## 2015-12-22 DIAGNOSIS — Z21 Asymptomatic human immunodeficiency virus [HIV] infection status: Secondary | ICD-10-CM

## 2015-12-22 DIAGNOSIS — J309 Allergic rhinitis, unspecified: Secondary | ICD-10-CM | POA: Diagnosis not present

## 2015-12-22 DIAGNOSIS — E890 Postprocedural hypothyroidism: Secondary | ICD-10-CM | POA: Diagnosis not present

## 2015-12-22 DIAGNOSIS — H1010 Acute atopic conjunctivitis, unspecified eye: Secondary | ICD-10-CM

## 2015-12-22 DIAGNOSIS — K219 Gastro-esophageal reflux disease without esophagitis: Secondary | ICD-10-CM | POA: Diagnosis not present

## 2015-12-22 LAB — GLUCOSE, RANDOM: Glucose, Bld: 165 mg/dL — ABNORMAL HIGH (ref 70–99)

## 2015-12-22 LAB — T4, FREE: FREE T4: 1.36 ng/dL (ref 0.60–1.60)

## 2015-12-22 LAB — TSH: TSH: 2.43 u[IU]/mL (ref 0.35–4.50)

## 2015-12-22 NOTE — Patient Instructions (Addendum)
  1. Submit approval for Xolair administration  2. Continue a combination of Advair 230 2 inhalations twice a day and Qvar 80 2 inhalations twice a day  3. Continue montelukast 10 mg daily  4. Continue Flonase 1-2 sprays each nostril one time per day  5. Continue Nexium 40 mg once a day if needed  6. Continue ophthalmology prescribed eyedrops as needed  7. Continue Ventolin HFA 2 puffs every 4-6 hours if needed  8. Continue fexofenadine/Allegra 180 one time per day if needed  9. Return to clinic in 12 weeks or earlier if problem

## 2015-12-22 NOTE — Progress Notes (Signed)
Follow-up Note  Referring Provider: Tresa Garter, MD Primary Provider: Angelica Chessman, MD Date of Office Visit: 12/22/2015  Subjective:   Steven [name] (DOB: Oct 26, 1980) is a 35 y.o. male who returns to the [facility] [place] on 12/22/2015 in re-evaluation of the following:  HPI: Steven [name] returns to this clinic in reevaluation of his recent asthma exacerbation evaluated and treated on 12/09/2015. He is much better and is now back to baseline. He does not have any wheezing and coughing and does not need to use a bronchodilator at all. He's had no issues with his nose although his eyes are still little bit itchy.    Medication List           acetaminophen 500 MG tablet  Commonly known as:  TYLENOL  Take 500 mg by mouth every 6 (six) hours as needed. Pain or fever     albuterol 108 (90 Base) MCG/ACT inhaler  Commonly known as:  VENTOLIN HFA  Inhale 2 puffs into the lungs every 4 (four) hours as needed for wheezing or shortness of breath.     amLODipine 10 MG tablet  Commonly known as:  NORVASC  Take 1 tablet (10 mg total) by mouth daily.     esomeprazole 40 MG capsule  Commonly known as:  NEXIUM  Take 1 capsule (40 mg total) by mouth daily at 12 noon.     fexofenadine 180 MG tablet  Commonly known as:  ALLEGRA  Take 1 tablet (180 mg total) by mouth daily.     fluorometholone 0.1 % ophthalmic suspension  Commonly known as:  FML  Place 1 drop into both eyes 2 (two) times daily.     fluticasone 50 MCG/ACT nasal spray  Commonly known as:  FLONASE  Place 2 sprays into both nostrils daily.     fluticasone-salmeterol 230-21 MCG/ACT inhaler  Commonly known as:  ADVAIR HFA  Inhale 2 puffs into the lungs 2 (two) times daily.     hydrochlorothiazide 25 MG tablet  Commonly known as:  HYDRODIURIL  Take 1 tablet (25 mg total) by mouth daily.     levothyroxine 150 MCG tablet  Commonly known as:  SYNTHROID, LEVOTHROID  Take 1 tablet (150 mcg total) by mouth  daily.     lidocaine-prilocaine cream  Commonly known as:  EMLA  Apply 1 application topically as needed. Apply topically to port a cath 30 min to 1 hour before access.     montelukast 10 MG tablet  Commonly known as:  SINGULAIR  Take 1 tablet (10 mg total) by mouth daily.     multivitamin tablet  Take 1 tablet by mouth daily.     potassium chloride SA 20 MEQ tablet  Commonly known as:  K-DUR,KLOR-CON  TAKE 1 TABLET BY MOUTH DAILY     QVAR 80 MCG/ACT inhaler  Generic drug:  beclomethasone  INHALE 2 PUFFS BY MOUTH TWICE DAILY TO PREVENT COUGH OR WHEEZE, RINSE, GARGLE, AND SPIT AFTER USE     TRIUMEQ 600-50-300 MG tablet  Generic drug:  abacavir-dolutegravir-lamiVUDine  TAKE 1 TABLET BY MOUTH EVERY DAY        Past Medical History  Diagnosis Date  . HIV positive ([place]) 03/23/09    Genotype Y181C  . HTN (hypertension)   . Syphilis 03/23/09-    1:2  . Kaposi's sarcoma   . Cancer ([place])   . Asthma   . Diabetes mellitus without complication [facility])     Past Surgical History  Procedure  Laterality Date  . Brain surgery      Allergies  Allergen Reactions  . Lisinopril Swelling    Swelling of lower lip while on lisinopril  . Penicillins Hives and Swelling    REACTION: rash and swelling  . Aspirin Nausea Only    Review of systems negative except as noted in HPI / PMHx or noted below:  Review of Systems  Constitutional: Negative.   HENT: Negative.   Eyes: Negative.   Respiratory: Negative.   Cardiovascular: Negative.   Gastrointestinal: Negative.   Genitourinary: Negative.   Musculoskeletal: Negative.   Skin: Negative.   Neurological: Negative.   Endo/Heme/Allergies: Negative.   Psychiatric/Behavioral: Negative.      Objective:   Filed Vitals:   12/22/15 1731  BP: 142/100  Pulse: 76  Resp: 20          Physical Exam  Constitutional: He is well-developed, well-nourished, and in no distress.  HENT:  Head: Normocephalic.  Right Ear: Tympanic membrane,  external ear and ear canal normal.  Left Ear: Tympanic membrane, external ear and ear canal normal.  Nose: Mucosal edema present. No rhinorrhea.  Mouth/Throat: Uvula is midline, oropharynx is clear and moist and mucous membranes are normal. No oropharyngeal exudate.  Eyes: Right conjunctiva is injected. Left conjunctiva is injected.  Neck: Trachea normal. No tracheal tenderness present. No tracheal deviation present. No thyromegaly present.  Cardiovascular: Normal rate, regular rhythm, S1 normal, S2 normal and normal heart sounds.   No murmur heard. Pulmonary/Chest: Breath sounds normal. No stridor. No respiratory distress. He has no wheezes. He has no rales.  Musculoskeletal: He exhibits no edema.  Lymphadenopathy:       Head (right side): No tonsillar adenopathy present.       Head (left side): No tonsillar adenopathy present.    He has no cervical adenopathy.  Neurological: He is alert. Gait normal.  Skin: No rash noted. He is not diaphoretic. No erythema. Nails show no clubbing.  Psychiatric: Mood and affect normal.    Diagnostics:    Spirometry was performed and demonstrated an FEV1 of 2.65 at 66 % of predicted.  The patient had an Asthma Control Test with the following results: ACT Total Score: 22.    Assessment and Plan:   1. Asthma, severe persistent, well-controlled   2. Allergic rhinoconjunctivitis   3. Gastroesophageal reflux disease, esophagitis presence not specified   4. HIV (human immunodeficiency virus infection) (Steven [name])      1. Submit approval for Xolair administration  2. Continue a combination of Advair 230 2 inhalations twice a day and Qvar 80 2 inhalations twice a day  3. Continue montelukast 10 mg daily  4. Continue Flonase 1-2 sprays each nostril one time per day  5. Continue Nexium 40 mg once a day if needed  6. Continue ophthalmology prescribed eyedrops as needed  7. Continue Ventolin HFA 2 puffs every 4-6 hours if needed  8. Continue  fexofenadine/Allegra 180 one time per day if needed  9. Return to clinic in 12 weeks or earlier if problem   I think the best way to approach marks atopic immune system is to start him on Xolair which will hopefully allow Steven [name] to lower his medications and minimize potential drug interactions with his HIV antiretroviral therapy. I will see him back in this clinic in 12 weeks or earlier if there is a problem.  Allena Katz, MD [place]

## 2015-12-25 ENCOUNTER — Ambulatory Visit (INDEPENDENT_AMBULATORY_CARE_PROVIDER_SITE_OTHER): Payer: Medicare Other | Admitting: Endocrinology

## 2015-12-25 VITALS — BP 114/80 | HR 96 | Ht 73.0 in | Wt 300.0 lb

## 2015-12-25 DIAGNOSIS — E890 Postprocedural hypothyroidism: Secondary | ICD-10-CM

## 2015-12-25 DIAGNOSIS — E876 Hypokalemia: Secondary | ICD-10-CM | POA: Diagnosis not present

## 2015-12-25 DIAGNOSIS — E119 Type 2 diabetes mellitus without complications: Secondary | ICD-10-CM | POA: Diagnosis not present

## 2015-12-25 LAB — BASIC METABOLIC PANEL
BUN: 15 mg/dL (ref 6–23)
CHLORIDE: 95 meq/L — AB (ref 96–112)
CO2: 33 mEq/L — ABNORMAL HIGH (ref 19–32)
CREATININE: 1.36 mg/dL (ref 0.40–1.50)
Calcium: 9.9 mg/dL (ref 8.4–10.5)
GFR: 75.99 mL/min (ref >60.00)
Glucose, Bld: 157 mg/dL — ABNORMAL HIGH (ref 70–99)
Potassium: 2.9 mEq/L — ABNORMAL LOW (ref 3.5–5.1)
SODIUM: 138 meq/L (ref 135–145)

## 2015-12-25 LAB — POCT GLYCOSYLATED HEMOGLOBIN (HGB A1C): HEMOGLOBIN A1C: 6.8

## 2015-12-25 MED ORDER — LEVOTHYROXINE SODIUM 150 MCG PO TABS: 150.0000 ug | ORAL_TABLET | Freq: Every day | ORAL | Status: DC

## 2015-12-25 MED ORDER — METFORMIN HCL ER 500 MG PO TB24: 1500.0000 mg | ORAL_TABLET | Freq: Every day | ORAL | Status: DC

## 2015-12-25 NOTE — Progress Notes (Signed)
Patient ID: Steven [name], male   DOB: [birth year], 35 y.o.   MRN: [id number]   Reason for Appointment:  Hypothyroidism, follow-up visit    History of Present Illness:   HYPOTHYROIDISM  was first diagnosed in ? Early 2013 He previously had hyperthyroidism which was treated with radioactive iodine in 05/2011 He had been taking Synthroid 200 mcg daily initially and it was then reduced to 175 mcg Because of persistently low TSH the dose was again reduced in 08/2014 down to 150 g  He has been a stable dose of levothyroxine since 2/16 Does not complain of any unusual fatigue, significant weight change, sluggishness or heat or cold intolerance   Wt Readings from Last 3 Encounters:  12/25/15 300 lb (136.079 kg)  08/27/15 299 lb (135.626 kg)  06/09/15 298 lb 1.6 oz (135.217 kg)    He is quite compliant with taking his medication in the morning about an hour before breakfast and has not missed any doses.   He does take Centrum multivitamin after breakfast  TSH is again stable  Lab Results  Component Value Date   TSH 2.43 12/22/2015   TSH 0.82 11/12/2014   TSH 0.19* 08/11/2014   FREET4 1.36 12/22/2015   FREET4 1.15 11/12/2014   FREET4 1.32 08/11/2014    OTHER active problems including diabetes are discussed in review of systems:    Past Medical History  Diagnosis Date  . HIV positive ([place]) 03/23/09    Genotype Y181C  . HTN (hypertension)   . Syphilis 03/23/09-    1:2  . Kaposi's sarcoma   . Cancer ([place])   . Asthma   . Diabetes mellitus without complication [facility])     Past Surgical History  Procedure Laterality Date  . Brain surgery      Family History  Problem Relation Age of Onset  . Pancreatic cancer Mother   . Cancer Mother   . Diabetes Paternal Grandmother   . Thyroid disease Paternal Grandmother     Social History:  reports that he has never smoked. He has never used smokeless tobacco. He reports that he drinks alcohol. He reports that he does  not use illicit drugs.  Allergies:  Allergies  Allergen Reactions  . Lisinopril Swelling    Swelling of lower lip while on lisinopril  . Penicillins Hives and Swelling    REACTION: rash and swelling  . Aspirin Nausea Only      Medication List       This list is accurate as of: 12/25/15 11:59 PM.  Always use your most recent med list.               acetaminophen 500 MG tablet  Commonly known as:  TYLENOL  Take 500 mg by mouth every 6 (six) hours as needed. Pain or fever     albuterol 108 (90 Base) MCG/ACT inhaler  Commonly known as:  VENTOLIN HFA  Inhale 2 puffs into the lungs every 4 (four) hours as needed for wheezing or shortness of breath.     amLODipine 10 MG tablet  Commonly known as:  NORVASC  Take 1 tablet (10 mg total) by mouth daily.     esomeprazole 40 MG capsule  Commonly known as:  NEXIUM  Take 1 capsule (40 mg total) by mouth daily at 12 noon.     fexofenadine 180 MG tablet  Commonly known as:  ALLEGRA  Take 1 tablet (180 mg total) by mouth daily.     fluorometholone 0.1 %  ophthalmic suspension  Commonly known as:  FML  Place 1 drop into both eyes 2 (two) times daily.     fluticasone 50 MCG/ACT nasal spray  Commonly known as:  FLONASE  Place 2 sprays into both nostrils daily.     fluticasone-salmeterol 230-21 MCG/ACT inhaler  Commonly known as:  ADVAIR HFA  Inhale 2 puffs into the lungs 2 (two) times daily.     hydrochlorothiazide 25 MG tablet  Commonly known as:  HYDRODIURIL  Take 1 tablet (25 mg total) by mouth daily.     levothyroxine 150 MCG tablet  Commonly known as:  SYNTHROID, LEVOTHROID  Take 1 tablet (150 mcg total) by mouth daily.     lidocaine-prilocaine cream  Commonly known as:  EMLA  Apply 1 application topically as needed. Apply topically to port a cath 30 min to 1 hour before access.     metFORMIN 500 MG 24 hr tablet  Commonly known as:  GLUCOPHAGE-XR  Take 3 tablets (1,500 mg total) by mouth daily with supper.      montelukast 10 MG tablet  Commonly known as:  SINGULAIR  Take 1 tablet (10 mg total) by mouth daily.     multivitamin tablet  Take 1 tablet by mouth daily.     potassium chloride SA 20 MEQ tablet  Commonly known as:  K-DUR,KLOR-CON  TAKE 1 TABLET BY MOUTH DAILY     QVAR 80 MCG/ACT inhaler  Generic drug:  beclomethasone  INHALE 2 PUFFS BY MOUTH TWICE DAILY TO PREVENT COUGH OR WHEEZE, RINSE, GARGLE, AND SPIT AFTER USE     TRIUMEQ 600-50-300 MG tablet  Generic drug:  abacavir-dolutegravir-lamiVUDine  TAKE 1 TABLET BY MOUTH EVERY DAY        Review of Systems:  Ophthalmopathy: eyes are mildly irritated and relieved with using artificial tears.  Also he thinks he has had more allergies being treated by ophthalmologist  DIABETES: He had been previously treated with glipizide for severe hyperglycemia in 2016 He initially went to some diabetes classes but does not really follow any meal plan and has not been able to lose weight He does not exercise, has some activity at work  He has not checked his blood sugars in the couple of months, does have unknown Generic monitor at home  He was told to stop his glipizide in February because of low-normal A1c from PCP but he has not followed up as directed Afternoon glucose after missing lunch was still high at 165 in the lab Also A1c has increased significantly   Lab Results  Component Value Date   HGBA1C 6.8 12/25/2015   HGBA1C 4.90 08/27/2015   HGBA1C 10.6 03/23/2015   Lab Results  Component Value Date   LDLCALC 101* 07/22/2014   CREATININE 1.36 12/25/2015     CARDIOLOGY:  history of high blood pressure treated by PCP            Examination:    BP 114/80 mmHg  Pulse 96  Ht '6\' 1"'$  (1.854 m)  Wt 300 lb (136.079 kg)  BMI 39.59 kg/m2  SpO2 97%  He looks well. Mild prominence of eyes without significant swelling.    REFLEXES: at biceps are normal.   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  DIABETES: He is getting hypoglycemia again with stopping glipizide although not on any medication now He has only fair control of diet and no significant exercise regimen Still has significantly high BMI He has not followed up with PCP and does not have an appointment scheduled  Discussed in detail the pathogenesis of diabetes, insulin resistance, need for lifestyle changes with weight loss and periodic glucose monitoring Since his A1c is getting higher he does need to be on pharmacological treatment with an insulin sensitizer like metformin Discussed in detail the actions and treatment with metformin and given him and out on this Discussed titration of the medication weekly by 500 mg based on tolerability with GI symptoms and maximum dose of 1500 mg Will check his BMP today to confirm normal renal function Discussed checking his blood sugar at home periodically and reviewed blood sugar targets   Patient Instructions  Stay on same dose  Check blood sugars on waking up  2  times a week  Also check blood sugars about 2 hours after a meal and do this after different meals by rotation  Recommended blood sugar levels on waking up is 90-130 and about 2 hours after meal is 130-160  Please bring your blood sugar monitor to each visit, thank you      [facility] 12/26/2015, 2:16 PM    Addendum: Creatinine normal blood potassium 2.9, he will cut his HCTZ in half and double potassium supplement

## 2015-12-25 NOTE — Patient Instructions (Addendum)
Stay on same dose  Check blood sugars on waking up  2  times a week  Also check blood sugars about 2 hours after a meal and do this after different meals by rotation  Recommended blood sugar levels on waking up is 90-130 and about 2 hours after meal is 130-160  Please bring your blood sugar monitor to each visit, thank you

## 2015-12-28 ENCOUNTER — Telehealth: Payer: Self-pay | Admitting: Endocrinology

## 2015-12-28 NOTE — Telephone Encounter (Signed)
Called patient,left message for return call.

## 2015-12-28 NOTE — Telephone Encounter (Signed)
Patient calling for labs results

## 2015-12-29 ENCOUNTER — Telehealth: Payer: Self-pay | Admitting: Nurse Practitioner

## 2015-12-29 NOTE — Telephone Encounter (Signed)
pt cld and made appt to see Steven [name]-wanted to know ehy labs never done-adv no ordersin from here/adv [employer] has orders in-adv to inq about labs @ next visit

## 2015-12-30 ENCOUNTER — Other Ambulatory Visit: Payer: Self-pay | Admitting: Nurse Practitioner

## 2016-01-11 ENCOUNTER — Other Ambulatory Visit: Payer: Self-pay | Admitting: Internal Medicine

## 2016-01-13 ENCOUNTER — Ambulatory Visit: Payer: Medicare Other | Admitting: Nurse Practitioner

## 2016-01-13 ENCOUNTER — Telehealth: Payer: Self-pay | Admitting: Nurse Practitioner

## 2016-01-13 NOTE — Telephone Encounter (Signed)
pt cld to r/s appt-gave r/s time & date for 712 '@2'$ :30

## 2016-01-20 ENCOUNTER — Telehealth: Payer: Self-pay | Admitting: Oncology

## 2016-01-20 ENCOUNTER — Ambulatory Visit (HOSPITAL_BASED_OUTPATIENT_CLINIC_OR_DEPARTMENT_OTHER): Payer: Medicare Other

## 2016-01-20 ENCOUNTER — Ambulatory Visit (HOSPITAL_BASED_OUTPATIENT_CLINIC_OR_DEPARTMENT_OTHER): Payer: Medicare Other | Admitting: Nurse Practitioner

## 2016-01-20 VITALS — BP 133/83 | HR 76 | Temp 99.0°F | Resp 17 | Ht 73.0 in | Wt 301.2 lb

## 2016-01-20 DIAGNOSIS — Z452 Encounter for adjustment and management of vascular access device: Secondary | ICD-10-CM

## 2016-01-20 DIAGNOSIS — C469 Kaposi's sarcoma, unspecified: Secondary | ICD-10-CM

## 2016-01-20 DIAGNOSIS — B20 Human immunodeficiency virus [HIV] disease: Secondary | ICD-10-CM | POA: Diagnosis not present

## 2016-01-20 DIAGNOSIS — Z95828 Presence of other vascular implants and grafts: Secondary | ICD-10-CM | POA: Insufficient documentation

## 2016-01-20 MED ORDER — ALTEPLASE 2 MG IJ SOLR
2.0000 mg | Freq: Once | INTRAMUSCULAR | Status: AC | PRN
  Administered 2016-01-20: 2 mg
  Filled 2016-01-20: qty 2

## 2016-01-20 MED ORDER — HEPARIN SOD (PORK) LOCK FLUSH 100 UNIT/ML IV SOLN
500.0000 [IU] | Freq: Once | INTRAVENOUS | Status: DC | PRN
  Filled 2016-01-20: qty 5

## 2016-01-20 MED ORDER — SODIUM CHLORIDE 0.9 % IJ SOLN
10.0000 mL | INTRAMUSCULAR | Status: DC | PRN
  Administered 2016-01-20: 10 mL via INTRAVENOUS
  Filled 2016-01-20: qty 10

## 2016-01-20 NOTE — Patient Instructions (Signed)

## 2016-01-20 NOTE — Progress Notes (Signed)
  Steven [name] OFFICE PROGRESS NOTE   Diagnosis:  Kaposi's sarcoma  INTERVAL HISTORY:   Steven [name] returns after missing a follow-up visit in February of this year. He reports having a death in the family around the time of that appointment. He feels well. No interim illnesses or infections. No new skin lesions. No fevers or sweats. He has a good appetite. He denies pain. No bowel or bladder problems.  Objective:  Vital signs in last 24 hours:  Blood pressure 133/83, pulse 76, temperature 99 F (37.2 C), temperature source Oral, resp. rate 17, height '6\' 1"'$  (1.854 m), weight 301 lb 3.2 oz (136.623 kg), SpO2 99 %.    HEENT: No thrush or ulcers. Lymphatics: No palpable cervical, supra clavicular or axillary lymph nodes. Resp: Lungs clear bilaterally. Cardio: Regular rate and rhythm. GI: Abdomen soft and nontender. No hepatomegaly. Vascular: Edema low legs bilaterally. Neuro: Alert and oriented.  Skin: Hyperpigmented raised round 5 mm lesion right mid back, 6 mm lesion left low back, 5 mm lesion right upper arm, 1 cm lesion left upper anterior thigh and 5 mm lesion lateral to the other thigh lesion.  Port-A-Cath without erythema.  Lab Results:  Lab Results  Component Value Date   WBC 6.3 12/16/2015   HGB 15.7 03/10/2015   HCT 49.3 12/16/2015   MCV 80 12/16/2015   PLT 174 12/16/2015   NEUTROABS 3.7 12/16/2015    Imaging:  No results found.  Medications: I have reviewed the patient's current medications.  Assessment/Plan: 1. 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   Status post Doxil (5 cycles 04/29/2009 through 09/22/2009 and then 3 cycles of paclitaxel 12/01/2009 through 02/09/2010.   Treatment subsequently placed on hold due to bilateral subdural hematomas.   CT scans 10/28/2013 showed a  new prevascular mass with PET scan showing mild low level FDG uptake in the anterior mediastinal mass with more pronounced FDG accumulation within the dominant mesenteric lymph node.   Restaging PET scan 03/13/2014 showed a decrease in low level FDG uptake associated with the prevascular mass; similar degree of FDG uptake associated with small mesenteric lymph nodes; persistent increased FDG uptake associated with the diffuse lytic bone metastasis. CT scan also 03/13/2014 showed interval decrease in the size of the anterior mediastinal mass, unchanged multiple small bowel mesenteric lymph nodes and re-demonstrated widespread lytic osseous metastasis. 2. HIV/AIDS followed by Dr. Johnnye Sima. He continues Abacavir-Dolutegravir-Lamivud.  3. History of bilateral subdural hematomas status post evacuation 03/05/2010. 4. Hypertension. 5. Renal dysfunction. 6. Asthma. 7. History of hyperthyroidism status post radioactive iodine November 2012 with subsequent hypothyroidism now on Synthroid. 8. Port-A-Cath placement 10/14/2009. 9. Lower extremity edema, bilateral, question lymphedema. He is followed at the lymphedema clinic. 10. Skin lesions. Status post evaluation by dermatology.     Disposition: Mr. Sparling appears well. He remains in clinical remission from [place].  At today's visit he inquired about having the Port-A-Cath removed. I will discuss with Dr. Benay Spice, but we can likely refer him for removal of the port.  He will return for a follow-up visit in 4 months. He will contact the office in the interim with any problems.   He plans to contact Dr. Algis Downs office to schedule a follow-up appointment.    Ned Card ANP/GNP-BC   01/20/2016  3:58 PM

## 2016-01-20 NOTE — Telephone Encounter (Signed)
GAVE PATIENT AVS REPORT AND APPOINTMENTS FOR AUGUST THRU November.

## 2016-01-22 ENCOUNTER — Telehealth: Payer: Self-pay

## 2016-01-22 NOTE — Telephone Encounter (Signed)
-----   Message from Owens Shark, NP sent at 01/21/2016  4:06 PM EDT ----- Please let him know Dr. Benay Spice is okay with having the Port-A-Cath removed. If he wants to proceed with this let me know and I will enter the order. I think radiology put the port in. Can you please confirm

## 2016-01-22 NOTE — Telephone Encounter (Signed)
Called and left message for patient to call back.

## 2016-01-26 ENCOUNTER — Other Ambulatory Visit: Payer: Self-pay | Admitting: Nurse Practitioner

## 2016-01-26 DIAGNOSIS — C469 Kaposi's sarcoma, unspecified: Secondary | ICD-10-CM

## 2016-01-26 NOTE — Telephone Encounter (Signed)
Pt returned call, he does want to proceed with port removal. Port was placed by IR. Will make provider aware for orders to be placed.

## 2016-01-26 NOTE — Telephone Encounter (Signed)
Lm for rtn call on pt cell phone

## 2016-01-28 ENCOUNTER — Ambulatory Visit: Payer: Medicare Other | Admitting: Podiatry

## 2016-02-04 ENCOUNTER — Ambulatory Visit (INDEPENDENT_AMBULATORY_CARE_PROVIDER_SITE_OTHER): Payer: Medicare Other | Admitting: Podiatry

## 2016-02-04 ENCOUNTER — Encounter: Payer: Self-pay | Admitting: Podiatry

## 2016-02-04 DIAGNOSIS — B351 Tinea unguium: Secondary | ICD-10-CM

## 2016-02-04 DIAGNOSIS — M79676 Pain in unspecified toe(s): Secondary | ICD-10-CM

## 2016-02-04 NOTE — Progress Notes (Signed)
Patient ID: Steven [name], male   DOB: 1981/05/29, 35 y.o.   MRN: [id number]  Subjective: 35 y.o. returns the office today for painful, elongated, thickened toenails which he cannot trim himself. Denies any redness or drainage around the nails. Denies any acute changes since last appointment and no new complaints today. Denies any systemic complaints such as fevers, chills, nausea, vomiting.   Objective: AAO x3, NAD DP/PT pulses palpable bilaterally, CRT less than 3 seconds Protective sensation intact with Simms Weinstein monofilament Nails are hypertrophic, dystrophic, brittle, discolored, elongated 10. There is tenderness in nails 1-5 bilaterally. There is no surrounding erythema or drainage. There is chronic lower extremity edema with lymphedema changes bilaterally.  No areas of tenderness to bilateral lower extremities. MMT 5/5, ROM WNL.  No open lesions or other pre-ulcerative lesions bilaterally.  No overlying edema, erythema, increase in warmth to bilateral lower extremities.  No pain with calf compression, swelling, warmth, erythema bilaterally.   Assessment: Patient presents with symptomatic onychomycosis  Plan: -Treatment options including alternatives, risks, complications were discussed -Nails sharply debrided 10 without complication/bleeding. -Discussed daily foot inspection. If there are any changes, to call the office immediately.  -Follow-up in 3 months or sooner if any problems are to arise. In the meantime, encouraged to call the office with any questions, concerns, changes symptoms.  Celesta Gentile, DPM

## 2016-02-05 ENCOUNTER — Ambulatory Visit (INDEPENDENT_AMBULATORY_CARE_PROVIDER_SITE_OTHER): Payer: Medicare Other | Admitting: *Deleted

## 2016-02-05 DIAGNOSIS — J4550 Severe persistent asthma, uncomplicated: Secondary | ICD-10-CM

## 2016-02-05 MED ORDER — OMALIZUMAB 150 MG ~~LOC~~ SOLR
300.0000 mg | SUBCUTANEOUS | Status: DC
  Administered 2016-02-05 – 2020-10-12 (×50): 300 mg via SUBCUTANEOUS

## 2016-02-10 ENCOUNTER — Other Ambulatory Visit: Payer: Self-pay | Admitting: Internal Medicine

## 2016-02-11 ENCOUNTER — Other Ambulatory Visit: Payer: Self-pay | Admitting: Physician Assistant

## 2016-02-11 ENCOUNTER — Other Ambulatory Visit: Payer: Self-pay | Admitting: Radiology

## 2016-02-12 ENCOUNTER — Other Ambulatory Visit: Payer: Self-pay | Admitting: Internal Medicine

## 2016-02-12 ENCOUNTER — Ambulatory Visit (HOSPITAL_COMMUNITY)
Admission: RE | Admit: 2016-02-12 | Discharge: 2016-02-12 | Disposition: A | Discharge status: Home / Self Care | Payer: Medicare Other | Source: Ambulatory Visit | Attending: Nurse Practitioner | Admitting: Nurse Practitioner

## 2016-02-12 ENCOUNTER — Other Ambulatory Visit: Payer: Self-pay | Admitting: Infectious Diseases

## 2016-02-12 ENCOUNTER — Other Ambulatory Visit: Payer: Self-pay | Admitting: Allergy and Immunology

## 2016-02-12 ENCOUNTER — Ambulatory Visit (HOSPITAL_COMMUNITY)
Admission: RE | Admit: 2016-02-12 | Discharge: 2016-02-12 | Disposition: A | Discharge status: Home / Self Care | Payer: Medicare Other | Source: Ambulatory Visit | Attending: Oncology | Admitting: Oncology

## 2016-02-12 ENCOUNTER — Encounter (HOSPITAL_COMMUNITY): Payer: Self-pay

## 2016-02-12 DIAGNOSIS — Z880 Allergy status to penicillin: Secondary | ICD-10-CM | POA: Insufficient documentation

## 2016-02-12 DIAGNOSIS — Z833 Family history of diabetes mellitus: Secondary | ICD-10-CM | POA: Diagnosis not present

## 2016-02-12 DIAGNOSIS — C469 Kaposi's sarcoma, unspecified: Secondary | ICD-10-CM | POA: Diagnosis not present

## 2016-02-12 DIAGNOSIS — B20 Human immunodeficiency virus [HIV] disease: Secondary | ICD-10-CM | POA: Diagnosis not present

## 2016-02-12 DIAGNOSIS — I10 Essential (primary) hypertension: Secondary | ICD-10-CM | POA: Diagnosis not present

## 2016-02-12 DIAGNOSIS — Z452 Encounter for adjustment and management of vascular access device: Secondary | ICD-10-CM | POA: Insufficient documentation

## 2016-02-12 DIAGNOSIS — E119 Type 2 diabetes mellitus without complications: Secondary | ICD-10-CM | POA: Diagnosis not present

## 2016-02-12 DIAGNOSIS — J45909 Unspecified asthma, uncomplicated: Secondary | ICD-10-CM | POA: Insufficient documentation

## 2016-02-12 DIAGNOSIS — Z808 Family history of malignant neoplasm of other organs or systems: Secondary | ICD-10-CM | POA: Diagnosis not present

## 2016-02-12 DIAGNOSIS — B370 Candidal stomatitis: Secondary | ICD-10-CM

## 2016-02-12 DIAGNOSIS — A539 Syphilis, unspecified: Secondary | ICD-10-CM | POA: Diagnosis not present

## 2016-02-12 HISTORY — PX: IR GENERIC HISTORICAL: IMG1180011

## 2016-02-12 LAB — CBC
HCT: 46.3 % (ref 39.0–52.0)
HEMOGLOBIN: 15.4 g/dL (ref 13.0–17.0)
MCH: 26.6 pg (ref 26.0–34.0)
MCHC: 33.3 g/dL (ref 30.0–36.0)
MCV: 80 fL (ref 78.0–100.0)
PLATELETS: 185 10*3/uL (ref 150–400)
RBC: 5.79 MIL/uL (ref 4.22–5.81)
RDW: 13.7 % (ref 11.5–15.5)
WBC: 6 10*3/uL (ref 4.0–10.5)

## 2016-02-12 LAB — PROTIME-INR
INR: 0.94
PROTHROMBIN TIME: 12.6 s (ref 11.4–15.2)

## 2016-02-12 LAB — APTT: aPTT: 31 seconds (ref 24–36)

## 2016-02-12 MED ORDER — SODIUM CHLORIDE 0.9 % IV SOLN
INTRAVENOUS | Status: DC
  Administered 2016-02-12: 14:00:00 via INTRAVENOUS

## 2016-02-12 MED ORDER — MIDAZOLAM HCL 2 MG/2ML IJ SOLN
INTRAMUSCULAR | Status: AC | PRN
  Administered 2016-02-12: 1 mg via INTRAVENOUS
  Administered 2016-02-12: 2 mg via INTRAVENOUS
  Administered 2016-02-12 (×3): 1 mg via INTRAVENOUS

## 2016-02-12 MED ORDER — FENTANYL CITRATE (PF) 100 MCG/2ML IJ SOLN
INTRAMUSCULAR | Status: AC | PRN
  Administered 2016-02-12: 50 ug via INTRAVENOUS
  Administered 2016-02-12 (×2): 25 ug via INTRAVENOUS

## 2016-02-12 MED ORDER — MIDAZOLAM HCL 2 MG/2ML IJ SOLN
INTRAMUSCULAR | Status: AC
Start: 2016-02-12 — End: 2016-02-12
  Filled 2016-02-12: qty 6

## 2016-02-12 MED ORDER — FENTANYL CITRATE (PF) 100 MCG/2ML IJ SOLN
INTRAMUSCULAR | Status: AC
  Filled 2016-02-12: qty 4

## 2016-02-12 MED ORDER — HYDROCODONE-ACETAMINOPHEN 5-325 MG PO TABS: 1.0000 | ORAL_TABLET | ORAL | Status: DC | PRN

## 2016-02-12 MED ORDER — VANCOMYCIN HCL 10 G IV SOLR
1500.0000 mg | Freq: Once | INTRAVENOUS | Status: AC
  Administered 2016-02-12: 1500 mg via INTRAVENOUS
  Filled 2016-02-12: qty 1500

## 2016-02-12 MED ORDER — LIDOCAINE HCL 1 % IJ SOLN
INTRAMUSCULAR | Status: AC
  Filled 2016-02-12: qty 20

## 2016-02-12 MED ORDER — LIDOCAINE HCL 1 % IJ SOLN
INTRAMUSCULAR | Status: AC | PRN
  Administered 2016-02-12: 10 mL

## 2016-02-12 NOTE — Discharge Instructions (Signed)
Incision Care An incision is when a surgeon cuts into your body. After surgery, the incision needs to be cared for properly to prevent infection.  HOW TO CARE FOR YOUR INCISION  Take medicines only as directed by your health care provider.  There are many different ways to close and cover an incision, including stitches, skin glue, and adhesive strips. Follow your health care provider's instructions on:  Incision care.  Bandage (dressing) changes and removal.  Incision closure removal.  Do not take baths, swim, or use a hot tub until your health care provider approves. You may shower as directed by your health care provider.  Resume your normal diet and activities as directed.  Use anti-itch medicine (such as an antihistamine) as directed by your health care provider. The incision may itch while it is healing. Do not pick or scratch at the incision.  Drink enough fluid to keep your urine clear or pale yellow. SEEK MEDICAL CARE IF:   You have drainage, redness, swelling, or pain at your incision site.  You have muscle aches, chills, or a general ill feeling.  You notice a bad smell coming from the incision or dressing.  Your incision edges separate after the sutures, staples, or skin adhesive strips have been removed.  You have persistent nausea or vomiting.  You have a fever.  You are dizzy. SEEK IMMEDIATE MEDICAL CARE IF:   You have a rash.  You faint.  You have difficulty breathing. MAKE SURE YOU:   Understand these instructions.  Will watch your condition.  Will get help right away if you are not doing well or get worse.   This information is not intended to replace advice given to you by your health care provider. Make sure you discuss any questions you have with your health care provider.   Document Released: 01/14/2005 Document Revised: 07/18/2014 Document Reviewed: 08/21/2013 Elsevier Interactive Patient Education 2016 Elsevier Inc. Moderate Conscious  Sedation, Adult Sedation is the use of medicines to promote relaxation and relieve discomfort and anxiety. Moderate conscious sedation is a type of sedation. Under moderate conscious sedation you are less alert than normal but are still able to respond to instructions or stimulation. Moderate conscious sedation is used during short medical and dental procedures. It is milder than deep sedation or general anesthesia and allows you to return to your regular activities sooner. LET [facility] CARE PROVIDER KNOW ABOUT:   Any allergies you have.  All medicines you are taking, including vitamins, herbs, eye drops, creams, and over-the-counter medicines.  Use of steroids (by mouth or creams).  Previous problems you or members of your family have had with the use of anesthetics.  Any blood disorders you have.  Previous surgeries you have had.  Medical conditions you have.  Possibility of pregnancy, if this applies.  Use of cigarettes, alcohol, or illegal drugs. RISKS AND COMPLICATIONS Generally, this is a safe procedure. However, as with any procedure, problems can occur. Possible problems include:  Oversedation.  Trouble breathing on your own. You may need to have a breathing tube until you are awake and breathing on your own.  Allergic reaction to any of the medicines used for the procedure. BEFORE THE PROCEDURE  You may have blood tests done. These tests can help show how well your kidneys and liver are working. They can also show how well your blood clots.  A physical exam will be done.  Only take medicines as directed by your health care provider. You may need  to stop taking medicines (such as blood thinners, aspirin, or nonsteroidal anti-inflammatory drugs) before the procedure.   Do not eat or drink at least 6 hours before the procedure or as directed by your health care provider.  Arrange for a responsible adult, family member, or friend to take you home after the procedure.  He or she should stay with you for at least 24 hours after the procedure, until the medicine has worn off. PROCEDURE   An intravenous (IV) catheter will be inserted into one of your veins. Medicine will be able to flow directly into your body through this catheter. You may be given medicine through this tube to help prevent pain and help you relax.  The medical or dental procedure will be done. AFTER THE PROCEDURE  You will stay in a recovery area until the medicine has worn off. Your blood pressure and pulse will be checked.   Depending on the procedure you had, you may be allowed to go home when you can tolerate liquids and your pain is under control.   This information is not intended to replace advice given to you by your health care provider. Make sure you discuss any questions you have with your health care provider.   Document Released: 03/22/2001 Document Revised: 07/18/2014 Document Reviewed: 03/04/2013 Elsevier Interactive Patient Education [employer].

## 2016-02-12 NOTE — Procedures (Signed)
Successul removal of right chest portacath. No immediate complication.  Minimal blood loss.  See full report in PACS.

## 2016-02-12 NOTE — H&P (Signed)
Chief Complaint: kaposi's sarcoma, port removal  Referring Physician:Dr. Ladell Pier  Supervising Physician: [name]  Patient Status:  Out-pt  HPI: Steven [name] is an 35 y.o. 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  He had a port a cath placed about 4 years ago.  He has completed his treatment and presents back for [facility] removal today.  He has no other complaints.  Past Medical History:  Past Medical History:  Diagnosis Date  . Asthma   . Cancer ([place])   . Diabetes mellitus without complication ([place])   . HIV positive ([place]) 03/23/09   Genotype Y181C  . HTN (hypertension)   . Kaposi's sarcoma   . Syphilis 03/23/09-   1:2    Past Surgical History:  Past Surgical History:  Procedure Laterality Date  . BRAIN SURGERY      Family History:  Family History  Problem Relation Age of Onset  . Pancreatic cancer Mother   . Cancer Mother   . Diabetes Paternal Grandmother   . Thyroid disease Paternal Grandmother     Social History:  reports that he has never smoked. He has never used smokeless tobacco. He reports that he drinks alcohol. He reports that he does not use drugs.  Allergies:  Allergies  Allergen Reactions  . Lisinopril Swelling    Swelling of lower lip while on lisinopril  . Penicillins Hives and Swelling    REACTION: rash and swelling  . Aspirin Nausea Only    Medications: meds reviewed in epic  Please HPI for pertinent positives, otherwise complete 10 system ROS negative.  Mallampati Score: MD Evaluation Airway: WNL Heart: WNL Abdomen: WNL Chest/ Lungs: WNL ASA  Classification: 2 Mallampati/Airway Score: Two  Physical Exam: BP 137/86 (BP Location: Left Arm)   Pulse 92   Temp 99 F (37.2 C) (Oral)   Resp 18   Ht '6\' 1"'$  (1.854 m)   Wt (!) 303 lb (137.4 kg)   SpO2 98%   BMI 39.98 kg/m  Body mass index is 39.98 kg/m. General: pleasant, obese black male who is laying in bed in NAD HEENT: head is normocephalic,  atraumatic.  Sclera are noninjected.  PERRL.  Ears and nose without any masses or lesions.  Mouth is pink and moist Heart: regular, rate, and rhythm.  Normal s1,s2. No obvious murmurs, gallops, or rubs noted.  Palpable radial and pedal pulses bilaterally Lungs: CTAB, no wheezes, rhonchi, or rales noted.  Respiratory effort nonlabored Abd: soft, NT, ND, +BS, no masses, hernias, or organomegaly Psych: A&Ox3 with an appropriate affect.   Labs: Results for orders placed or performed during the hospital encounter of 02/12/16 (from the past 48 hour(s))  APTT upon arrival     Status: None   Collection Time: 02/12/16  1:25 PM  Result Value Ref Range   aPTT 31 24 - 36 seconds  CBC upon arrival     Status: None   Collection Time: 02/12/16  1:25 PM  Result Value Ref Range   WBC 6.0 4.0 - 10.5 K/uL   RBC 5.79 4.22 - 5.81 MIL/uL   Hemoglobin 15.4 13.0 - 17.0 g/dL   HCT 46.3 39.0 - 52.0 %   MCV 80.0 78.0 - 100.0 fL   MCH 26.6 26.0 - 34.0 pg   MCHC 33.3 30.0 - 36.0 g/dL   RDW 13.7 11.5 - 15.5 %   Platelets 185 150 - 400 K/uL  Protime-INR upon arrival     Status:  None   Collection Time: 02/12/16  1:25 PM  Result Value Ref Range   Prothrombin Time 12.6 11.4 - 15.2 seconds   INR 0.94     Imaging: No results found.  Assessment/Plan 1. HIV/Kaposi's sarcoma -we will plan for removal of PAC today. -labs and vitals have been reviewed - the procedure including risks such as bleeding and infection were discussed with the patient.  He understands and wishes to proceed.  Thank you for this interesting consult.  I greatly enjoyed meeting [name] and look forward to participating in their care.  A copy of this report was sent to the requesting provider on this date.  Electronically Signed: Henreitta Cea 02/12/2016, 2:19 PM   I spent a total of  30 Minutes   in face to face in clinical consultation, greater than 50% of which was counseling/coordinating care for kaposi sarcoma, removal of  PAC

## 2016-02-16 ENCOUNTER — Ambulatory Visit: Payer: Medicare Other

## 2016-02-18 ENCOUNTER — Other Ambulatory Visit: Payer: Medicare Other

## 2016-02-19 ENCOUNTER — Ambulatory Visit (INDEPENDENT_AMBULATORY_CARE_PROVIDER_SITE_OTHER): Payer: Medicare Other | Admitting: *Deleted

## 2016-02-19 DIAGNOSIS — J4540 Moderate persistent asthma, uncomplicated: Secondary | ICD-10-CM

## 2016-02-23 ENCOUNTER — Other Ambulatory Visit: Payer: Medicare Other

## 2016-02-24 ENCOUNTER — Ambulatory Visit: Payer: Medicare Other | Admitting: Endocrinology

## 2016-03-01 ENCOUNTER — Other Ambulatory Visit: Payer: Self-pay | Admitting: Internal Medicine

## 2016-03-04 ENCOUNTER — Ambulatory Visit (INDEPENDENT_AMBULATORY_CARE_PROVIDER_SITE_OTHER): Payer: Medicare Other

## 2016-03-04 ENCOUNTER — Ambulatory Visit: Payer: Medicare Other

## 2016-03-04 DIAGNOSIS — J4540 Moderate persistent asthma, uncomplicated: Secondary | ICD-10-CM

## 2016-03-07 ENCOUNTER — Encounter: Payer: Self-pay | Admitting: Infectious Diseases

## 2016-03-18 ENCOUNTER — Ambulatory Visit (INDEPENDENT_AMBULATORY_CARE_PROVIDER_SITE_OTHER): Payer: Medicare Other

## 2016-03-18 DIAGNOSIS — J4540 Moderate persistent asthma, uncomplicated: Secondary | ICD-10-CM

## 2016-03-23 ENCOUNTER — Other Ambulatory Visit: Payer: Self-pay | Admitting: Endocrinology

## 2016-04-01 ENCOUNTER — Ambulatory Visit: Payer: Medicare Other

## 2016-04-01 ENCOUNTER — Ambulatory Visit (INDEPENDENT_AMBULATORY_CARE_PROVIDER_SITE_OTHER): Payer: Medicare Other

## 2016-04-01 DIAGNOSIS — J4540 Moderate persistent asthma, uncomplicated: Secondary | ICD-10-CM | POA: Diagnosis not present

## 2016-04-05 ENCOUNTER — Other Ambulatory Visit: Payer: Medicare Other

## 2016-04-05 ENCOUNTER — Ambulatory Visit (INDEPENDENT_AMBULATORY_CARE_PROVIDER_SITE_OTHER): Payer: Medicare Other

## 2016-04-05 DIAGNOSIS — Z23 Encounter for immunization: Secondary | ICD-10-CM

## 2016-04-05 DIAGNOSIS — B20 Human immunodeficiency virus [HIV] disease: Secondary | ICD-10-CM

## 2016-04-05 LAB — CBC WITH DIFFERENTIAL/PLATELET
BASOS PCT: 1 %
Basophils Absolute: 61 cells/uL (ref 0–200)
EOS PCT: 2 %
Eosinophils Absolute: 122 cells/uL (ref 15–500)
HCT: 48.4 % (ref 38.5–50.0)
HEMOGLOBIN: 16 g/dL (ref 13.2–17.1)
LYMPHS ABS: 2623 {cells}/uL (ref 850–3900)
Lymphocytes Relative: 43 %
MCH: 26.2 pg — ABNORMAL LOW (ref 27.0–33.0)
MCHC: 33.1 g/dL (ref 32.0–36.0)
MCV: 79.3 fL — ABNORMAL LOW (ref 80.0–100.0)
MONOS PCT: 5 %
Monocytes Absolute: 305 cells/uL (ref 200–950)
NEUTROS ABS: 2989 {cells}/uL (ref 1500–7800)
NEUTROS PCT: 49 %
PLATELETS: 229 10*3/uL (ref 140–400)
RBC: 6.1 MIL/uL — ABNORMAL HIGH (ref 4.20–5.80)
RDW: 14.3 % (ref 11.0–15.0)
WBC: 6.1 10*3/uL (ref 3.8–10.8)

## 2016-04-05 LAB — COMPLETE METABOLIC PANEL WITH GFR
ALBUMIN: 4.5 g/dL (ref 3.6–5.1)
ALT: 26 U/L (ref 9–46)
AST: 17 U/L (ref 10–40)
Alkaline Phosphatase: 50 U/L (ref 40–115)
BILIRUBIN TOTAL: 0.7 mg/dL (ref 0.2–1.2)
BUN: 13 mg/dL (ref 7–25)
CHLORIDE: 97 mmol/L — AB (ref 98–110)
CO2: 26 mmol/L (ref 20–31)
CREATININE: 1.52 mg/dL — AB (ref 0.60–1.35)
Calcium: 9.6 mg/dL (ref 8.6–10.3)
GFR, EST NON AFRICAN AMERICAN: 58 mL/min — AB (ref >=60)
GFR, Est African American: 67 mL/min (ref >=60)
GLUCOSE: 166 mg/dL — AB (ref 65–99)
POTASSIUM: 3.4 mmol/L — AB (ref 3.5–5.3)
SODIUM: 140 mmol/L (ref 135–146)
Total Protein: 7.5 g/dL (ref 6.1–8.1)

## 2016-04-06 LAB — RPR TITER

## 2016-04-06 LAB — FLUORESCENT TREPONEMAL AB(FTA)-IGG-BLD: FLUORESCENT TREPONEMAL ABS: REACTIVE — AB

## 2016-04-06 LAB — RPR: RPR Ser Ql: REACTIVE — AB

## 2016-04-06 LAB — T-HELPER CELL (CD4) - (RCID CLINIC ONLY)
CD4 T CELL ABS: 520 /uL (ref 400–2700)
CD4 T CELL HELPER: 18 % — AB (ref 33–55)

## 2016-04-07 LAB — HIV-1 RNA QUANT-NO REFLEX-BLD: HIV 1 RNA Quant: <20 copies/mL (ref <20)

## 2016-04-19 ENCOUNTER — Ambulatory Visit: Payer: Medicare Other | Admitting: Infectious Diseases

## 2016-04-20 ENCOUNTER — Other Ambulatory Visit: Payer: Self-pay | Admitting: Internal Medicine

## 2016-04-22 ENCOUNTER — Ambulatory Visit (INDEPENDENT_AMBULATORY_CARE_PROVIDER_SITE_OTHER): Payer: Medicare Other

## 2016-04-22 DIAGNOSIS — J4540 Moderate persistent asthma, uncomplicated: Secondary | ICD-10-CM | POA: Diagnosis not present

## 2016-05-01 ENCOUNTER — Encounter (HOSPITAL_COMMUNITY): Payer: Self-pay | Admitting: Emergency Medicine

## 2016-05-01 ENCOUNTER — Ambulatory Visit (HOSPITAL_COMMUNITY)
Admission: EM | Admit: 2016-05-01 | Discharge: 2016-05-01 | Disposition: A | Discharge status: Home / Self Care | Payer: Medicare Other | Attending: Family Medicine | Admitting: Family Medicine

## 2016-05-01 DIAGNOSIS — M7661 Achilles tendinitis, right leg: Secondary | ICD-10-CM | POA: Diagnosis not present

## 2016-05-01 NOTE — ED Provider Notes (Signed)
[place]    CSN: [id number] Arrival date & time: 05/01/16  1207     History   Chief Complaint Chief Complaint  Patient presents with  . Foot Pain    HPI Steven [name] is a 35 y.o. male.   HPI  Patient presents with the above complaint.  States that posterior heel pain and swelling ongoing x 2 days.  Denies injury or previous problems before onset.  Aggravated with ambulation.  Has hx of diabetes and followed [facility] [place].   Past Medical History:  Diagnosis Date  . Asthma   . Cancer ([place])   . Diabetes mellitus without complication ([place])   . HIV positive ([place]) 03/23/09   Genotype Y181C  . HTN (hypertension)   . Kaposi's sarcoma   . Syphilis 03/23/09-   1:2    Patient Active Problem List   Diagnosis Date Noted  . Port catheter in place 01/20/2016  . Diabetes mellitus type 2 with complications, uncontrolled ([place]) 03/25/2015  . CKD (chronic kidney disease) stage 3, GFR 30-59 ml/min 03/23/2015  . Oral thrush 03/23/2015  . Essential hypertension 11/24/2014  . Post-nasal drip 02/05/2014  . 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 Arthralgia of elbow, right 07/29/2013  . Renal dysfunction 04/12/2012  . Colitis 05/27/2011  . KS (Kaposi's sarcoma) ([place]) 04/11/2011  . 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 Human immunodeficiency virus (HIV) disease ([place]) 04/10/2009  . CANDIDIASIS, ORAL 04/10/2009  . HYPERTENSION 04/10/2009  . Lymphedema 04/10/2009  . SYPHILIS 04/06/2009    Past Surgical History:  Procedure Laterality Date  . BRAIN SURGERY    . IR GENERIC HISTORICAL  02/12/2016   IR REMOVAL TUN ACCESS W/ PORT W/O FL MOD SED 02/12/2016 [name], MD [facility]-INTERV RAD       Home Medications    Prior to Admission medications   Medication Sig Start Date End Date Taking? Authorizing Provider  albuterol (VENTOLIN HFA) 108 (90 Base) MCG/ACT inhaler Inhale two puffs every 4-6 hours if needed for  cough or wheeze 02/12/16  Yes Jiles Prows, MD  hydrochlorothiazide (HYDRODIURIL) 25 MG tablet TAKE 1 TABLET BY MOUTH DAILY 02/12/16  Yes Tresa Garter, MD  metFORMIN (GLUCOPHAGE-XR) 500 MG 24 hr tablet TAKE 3 TABLETS(1500 MG) BY MOUTH DAILY WITH SUPPER 03/23/16  Yes Elayne Snare, MD  potassium chloride SA (K-DUR,KLOR-CON) 20 MEQ tablet TAKE 1 TABLET BY MOUTH DAILY 04/21/16  Yes Tresa Garter, MD  TRIUMEQ 600-50-300 MG tablet TAKE 1 TABLET BY MOUTH EVERY DAY 02/12/16  Yes Campbell Riches, MD  acetaminophen (TYLENOL) 500 MG tablet Take 500 mg by mouth every 6 (six) hours as needed. Pain or fever    Historical Provider, MD  amLODipine (NORVASC) 10 MG tablet TAKE 1 TABLET BY MOUTH EVERY DAY 02/12/16   Tresa Garter, MD  esomeprazole (NEXIUM) 40 MG capsule Take 1 capsule (40 mg total) by mouth daily at 12 noon. 12/08/15   Jiles Prows, MD  fexofenadine (ALLEGRA) 180 MG tablet Take 1 tablet (180 mg total) by mouth daily. 05/03/12   Harden Mo, MD  fluorometholone (FML) 0.1 % ophthalmic suspension Place 1 drop into both eyes 2 (two) times daily. 11/27/15   Historical Provider, MD  fluticasone (FLONASE) 50 MCG/ACT nasal spray Place 2 sprays into both nostrils daily. 12/08/15   Jiles Prows, MD  fluticasone-salmeterol (ADVAIR HFA) [id number] MCG/ACT inhaler Inhale two puffs twice daily to prevent cough or wheeze 02/12/16  Jiles Prows, MD  levothyroxine (SYNTHROID, LEVOTHROID) 150 MCG tablet Take 1 tablet (150 mcg total) by mouth daily. 12/25/15   Elayne Snare, MD  lidocaine-prilocaine (EMLA) cream Apply 1 application topically as needed. Apply topically to port a cath 30 min to 1 hour before access. 06/06/14   Romelia Bromell Shark, NP  montelukast (SINGULAIR) 10 MG tablet Take 1 tablet (10 mg total) by mouth daily. 12/08/15   Jiles Prows, MD  Multiple Vitamin (MULTIVITAMIN) tablet Take 1 tablet by mouth daily.    Historical Provider, MD  PROAIR HFA 108 (90 Base) MCG/ACT inhaler INHALE 2 PUFFS BY MOUTH EVERY  4 HOURS AS NEEDED FOR WHEEZING OR SHORTNESS OF BREATH 02/12/16   Olugbemiga E Doreene Burke, MD  QVAR 80 MCG/ACT inhaler INHALE 2 PUFFS BY MOUTH TWICE DAILY TO PREVENT COUGH OR WHEEZE, RINSE, GARGLE, AND SPIT AFTER USE Patient taking differently: INHALE 1 PUFFS BY MOUTH TWICE DAILY TO PREVENT COUGH OR WHEEZE, RINSE, GARGLE, AND SPIT AFTER USE 07/30/15   Jiles Prows, MD    Family History Family History  Problem Relation Age of Onset  . Pancreatic cancer Mother   . Cancer Mother   . Diabetes Paternal Grandmother   . Thyroid disease Paternal Grandmother     Social History Social History  Substance Use Topics  . Smoking status: Never Smoker  . Smokeless tobacco: Never Used  . Alcohol use 0.0 oz/week     Comment: socially - les snow     Allergies   Lisinopril; Penicillins; and Aspirin   Review of Systems Review of Systems  Constitutional: Negative.   HENT: Negative.   Respiratory: Negative.   Cardiovascular: Negative.   Gastrointestinal: Negative.   Genitourinary: Negative.   Musculoskeletal: Positive for gait problem.  Psychiatric/Behavioral: Negative.      Physical Exam Triage Vital Signs ED Triage Vitals  Enc Vitals Group     BP 05/01/16 1250 141/82     Pulse Rate 05/01/16 1250 93     Resp 05/01/16 1250 20     Temp 05/01/16 1250 98.6 F (37 C)     Temp Source 05/01/16 1250 Oral     SpO2 05/01/16 1250 96 %     Weight --      Height --      Head Circumference --      Peak Flow --      Pain Score 05/01/16 1252 7     Pain Loc --      Pain Edu? --      Excl. in [place]? --    No data found.   Updated Vital Signs BP 141/82 (BP Location: Left Arm)   Pulse 93   Temp 98.6 F (37 C) (Oral)   Resp 20   SpO2 96%   Visual Acuity Right Eye Distance:   Left Eye Distance:   Bilateral Distance:    Right Eye Near:   Left Eye Near:    Bilateral Near:     Physical Exam  Constitutional: He is oriented to person, place, and time. No distress.  HENT:  Head: Normocephalic  and atraumatic.  Eyes: EOM are normal. Pupils are equal, round, and reactive to light.  Neck: Normal range of motion.  Pulmonary/Chest: Effort normal.  Musculoskeletal:  Has marked TTP at the achilles calcaneal insertion.  nontender over the watershed region.  No palpable achilles defect. preachilles bursa tender.    Neurological: He is alert and oriented to person, place, and time.  Skin: Skin is  warm.     UC Treatments / Results  Labs (all labs ordered are listed, but only abnormal results are displayed) Labs Reviewed - No data to display  EKG  EKG Interpretation None       Radiology No results found.  Procedures Procedures (including critical care time)  Medications Ordered in UC Medications - No data to display   Initial Impression / Assessment and Plan / UC Course  I have reviewed the triage vital signs and the nursing notes.  Pertinent labs & imaging results that were available during my care of the patient were reviewed by me and considered in my medical decision making (see chart for details).  Clinical Course      Final Clinical Impressions(s) / UC Diagnoses   Final diagnoses:  Tendonitis, Achilles, right    New Prescriptions New Prescriptions   No medications on file  patient put in a cam walker today.  States that he has an appointment with [facility] in [place] this week and he was encourgaged to keep that appointment and f/u with them for this new problem.  Would avoid any aggressive activity.  Understands the risk of achilles tendon rupture.  All questions answered. Can use OTC anti-inflammatory.  Has been using ibuprofen.    Lanae Crumbly, PA-C 05/01/16 1402

## 2016-05-01 NOTE — ED Triage Notes (Signed)
The patient presented to the [facility] with a complaint of right heel pain x 2 days. The patient denied any known injury.

## 2016-05-05 ENCOUNTER — Ambulatory Visit: Payer: Medicare Other

## 2016-05-05 ENCOUNTER — Encounter: Payer: Self-pay | Admitting: Podiatry

## 2016-05-05 ENCOUNTER — Ambulatory Visit (INDEPENDENT_AMBULATORY_CARE_PROVIDER_SITE_OTHER): Payer: Medicare Other | Admitting: Podiatry

## 2016-05-05 DIAGNOSIS — M779 Enthesopathy, unspecified: Secondary | ICD-10-CM

## 2016-05-05 DIAGNOSIS — R52 Pain, unspecified: Secondary | ICD-10-CM

## 2016-05-05 DIAGNOSIS — M7731 Calcaneal spur, right foot: Secondary | ICD-10-CM

## 2016-05-05 DIAGNOSIS — M79676 Pain in unspecified toe(s): Secondary | ICD-10-CM

## 2016-05-05 DIAGNOSIS — B351 Tinea unguium: Secondary | ICD-10-CM | POA: Diagnosis not present

## 2016-05-05 NOTE — Progress Notes (Signed)
   Subjective:    Patient ID: Steven [name], male    DOB: 02-15-1981, 35 y.o.   MRN: [id number]  HPI   Patient presents the office today for concerns of thick, painful, elongated tenderness of the cannot trim himself. Denies any redness or drainage. He also states that he has pain in the back of his right foot along the heel which is been ongoing for last 3-4 days. He went to urgent care over the weekend they put him into a walking boot with no other treatment. He states his been somewhat better but does continue. Denies any recent injury or trauma. No swelling or redness. He has no other complaints today.   Review of Systems  All other systems reviewed and are negative.      Objective:   Physical Exam General: AAO x3, NAD  Dermatological: Nails are hypertrophic, dystrophic, brittle, discolored, elongated 10. No surrounding redness or drainage. Tenderness nails 1-5 bilaterally. No open lesions or pre-ulcerative lesions are identified today. Edema changes bilaterally.  Vascular: Dorsalis Pedis artery and Posterior Tibial artery pedal pulses are 2/4 bilateral with immedate capillary fill time. There is no pain with calf compression, swelling, warmth, erythema.   Neruologic: Sensation intact with Derrel Nip monofilament  Musculoskeletal: Prominent retrocalcaneal exostosis present on the posterior aspect of the right heel tenderness palpation of this area. There is no pain along the Achilles tendon and Thompson test is negative. No defect noted. There is no overlying edema, erythema, increase in warmth. Equinus is present. No other areas of tenderness. MMT 5/5. Range of motion intact.  Gait: Unassisted, Nonantalgic.      Assessment & Plan:  35 year old male with symptomatic onychomycosis, right posterior heel spur/tendinitis -Treatment options discussed including all alternatives, risks, and complications -Etiology of symptoms were discussed -X-rays of tendon reviewed. Posterior  calcaneal spurs present. No evidence of acute fracture. -Order compound cream for anti-inflammatory through [facility]. Excellent dispensed. Stretching, icing daily. Discussed shoe gear modifications. -Nails debrided 10 without complications or bleeding. -Follow-up as scheduled or sooner if needed. Call any questions or concerns in the meantime.  Celesta Gentile, DPM

## 2016-05-05 NOTE — Patient Instructions (Signed)

## 2016-05-06 ENCOUNTER — Ambulatory Visit (INDEPENDENT_AMBULATORY_CARE_PROVIDER_SITE_OTHER): Payer: Medicare Other

## 2016-05-06 DIAGNOSIS — J4540 Moderate persistent asthma, uncomplicated: Secondary | ICD-10-CM | POA: Diagnosis not present

## 2016-05-15 ENCOUNTER — Other Ambulatory Visit: Payer: Self-pay | Admitting: Internal Medicine

## 2016-05-15 DIAGNOSIS — I10 Essential (primary) hypertension: Secondary | ICD-10-CM

## 2016-05-17 ENCOUNTER — Other Ambulatory Visit: Payer: Self-pay | Admitting: Allergy and Immunology

## 2016-05-17 ENCOUNTER — Other Ambulatory Visit: Payer: Self-pay | Admitting: Internal Medicine

## 2016-05-17 DIAGNOSIS — E119 Type 2 diabetes mellitus without complications: Secondary | ICD-10-CM

## 2016-05-21 ENCOUNTER — Other Ambulatory Visit: Payer: Self-pay | Admitting: Internal Medicine

## 2016-05-23 ENCOUNTER — Telehealth: Payer: Self-pay | Admitting: Oncology

## 2016-05-23 ENCOUNTER — Ambulatory Visit (HOSPITAL_BASED_OUTPATIENT_CLINIC_OR_DEPARTMENT_OTHER): Payer: Medicare Other | Admitting: Oncology

## 2016-05-23 VITALS — BP 141/89 | HR 87 | Temp 99.1°F | Resp 18 | Wt 294.8 lb

## 2016-05-23 DIAGNOSIS — C469 Kaposi's sarcoma, unspecified: Secondary | ICD-10-CM | POA: Diagnosis not present

## 2016-05-23 DIAGNOSIS — B20 Human immunodeficiency virus [HIV] disease: Secondary | ICD-10-CM | POA: Diagnosis not present

## 2016-05-23 NOTE — Progress Notes (Signed)
  [place] OFFICE PROGRESS NOTE   Diagnosis: Kaposi's sarcoma  INTERVAL HISTORY:   [name] returns as scheduled. He currently has a "sinus infection". He has sinus drainage and increased wheezing. No new skin lesions. Stable hyperpigmented lesions at the right arm and left leg. He will see Dr. Johnnye Sima this week. No pain.  Objective:  Vital signs in last 24 hours:  Blood pressure (!) 141/89, pulse 87, temperature 99.1 F (37.3 C), temperature source Oral, resp. rate 18, weight 294 lb 12.8 oz (133.7 kg), SpO2 95 %.    HEENT: Neck without mass Lymphatics: No cervical, supraclavicular, axillary, or inguinal nodes Resp: Scattered end inspiratory wheeze at the posterior chest, no respiratory distress, good air movement bilaterally Cardio: Regular rate and rhythm GI: No hepatosplenomegaly, no mass Vascular: Chronic stasis change with trace edema at the low leg bilaterally  Skin: Round, raised, hyperpigmented lesions measuring less than 1 cm at the right upper arm and left thigh     Lab Results:  Lab Results  Component Value Date   WBC 6.1 04/05/2016   HGB 16.0 04/05/2016   HCT 48.4 04/05/2016   MCV 79.3 (L) 04/05/2016   PLT 229 04/05/2016   NEUTROABS 2,989 04/05/2016     Medications: I have reviewed the patient's current medications.  Assessment/Plan: 1. 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   Status post Doxil (5 cycles 04/29/2009 through 09/22/2009 and then 3 cycles of paclitaxel 12/01/2009 through 02/09/2010.   Treatment subsequently placed on hold due to bilateral subdural hematomas.   CT scans 10/28/2013 showed a new prevascular mass with PET scan showing mild low level FDG uptake in the anterior mediastinal mass with more pronounced FDG accumulation within the dominant mesenteric  lymph node.   Restaging PET scan 03/13/2014 showed a decrease in low level FDG uptake associated with the prevascular mass; similar degree of FDG uptake associated with small mesenteric lymph nodes; persistent increased FDG uptake associated with the diffuse lytic bone metastasis. CT scan also 03/13/2014 showed interval decrease in the size of the anterior mediastinal mass, unchanged multiple small bowel mesenteric lymph nodes and re-demonstrated widespread lytic osseous metastasis. 2. HIV/AIDS followed by Dr. Johnnye Sima. He continues Abacavir-Dolutegravir-Lamivud.  3. History of bilateral subdural hematomas status post evacuation 03/05/2010. 4. Hypertension. 5. Renal dysfunction. 6. Asthma. 7. History of hyperthyroidism status post radioactive iodine November 2012 with subsequent hypothyroidism now on Synthroid. 8. Port-A-Cath placement 10/14/2009. 9. Lower extremity edema, bilateral, question lymphedema. He is followed at the lymphedema clinic. 10. Skin lesions. Status post evaluation by dermatology.   Disposition:  There is no clinical evidence for progression of the Kaposi's sarcoma. Hopefully the sarcoma will remain in remission with control of the HIV infection. He will see Dr. Johnnye Sima this week. [name] will return for an office visit in 8 months. He will contact us in the interim for enlarging skin lesions or new symptoms.  Betsy Coder, MD  05/23/2016  3:59 PM

## 2016-05-23 NOTE — Telephone Encounter (Signed)
Gave patient avs report and appointments for July.  °

## 2016-05-24 ENCOUNTER — Encounter: Payer: Self-pay | Admitting: Infectious Diseases

## 2016-05-24 ENCOUNTER — Ambulatory Visit (INDEPENDENT_AMBULATORY_CARE_PROVIDER_SITE_OTHER): Payer: Medicare Other | Admitting: Infectious Diseases

## 2016-05-24 VITALS — BP 138/87 | HR 81 | Temp 98.7°F | Ht 73.5 in | Wt 295.0 lb

## 2016-05-24 DIAGNOSIS — E118 Type 2 diabetes mellitus with unspecified complications: Secondary | ICD-10-CM

## 2016-05-24 DIAGNOSIS — B20 Human immunodeficiency virus [HIV] disease: Secondary | ICD-10-CM

## 2016-05-24 DIAGNOSIS — C469 Kaposi's sarcoma, unspecified: Secondary | ICD-10-CM | POA: Diagnosis not present

## 2016-05-24 DIAGNOSIS — E1165 Type 2 diabetes mellitus with hyperglycemia: Secondary | ICD-10-CM

## 2016-05-24 DIAGNOSIS — N183 Chronic kidney disease, stage 3 unspecified: Secondary | ICD-10-CM

## 2016-05-24 DIAGNOSIS — Z113 Encounter for screening for infections with a predominantly sexual mode of transmission: Secondary | ICD-10-CM

## 2016-05-24 DIAGNOSIS — Z23 Encounter for immunization: Secondary | ICD-10-CM

## 2016-05-24 DIAGNOSIS — IMO0002 Reserved for concepts with insufficient information to code with codable children: Secondary | ICD-10-CM

## 2016-05-24 DIAGNOSIS — Z79899 Other long term (current) drug therapy: Secondary | ICD-10-CM

## 2016-05-24 NOTE — Assessment & Plan Note (Signed)
Relatively stable.  Appreciate pcp f/u.

## 2016-05-24 NOTE — Assessment & Plan Note (Signed)
Doing better Appreciate PCP f/u.

## 2016-05-24 NOTE — Addendum Note (Signed)
Addended by: Landis Gandy on: 05/24/2016 05:37 PM   Modules accepted: Orders

## 2016-05-24 NOTE — Assessment & Plan Note (Signed)
Stable, appreciate Onc f/u

## 2016-05-24 NOTE — Progress Notes (Signed)
   Subjective:    Patient ID: Steven [name], male    DOB: 04-09-1981, 35 y.o.   MRN: [id number]  HPI 35 yo M with hx of AIDS, KS.He is being monitored for his KS by Onc.  He was previously on CTX however in 2011 he had an intracranial bleed.   He was changed from TRV/ISN in 2014 to triumeq. He had f/u with Onc on 05-23-16, was felt to be doing well.   Has lost wt, his glc are better controlled.  FSG 75/80 in AM, 160-175 in afternoon.   HIV 1 RNA Quant (copies/mL)  Date Value  04/05/2016 <20  03/10/2015 <20  07/22/2014 <20   CD4 T Cell Abs (/uL)  Date Value  04/05/2016 520  03/11/2015 410  07/22/2014 310 (L)     Review of Systems  Constitutional: Negative for appetite change and unexpected weight change.  Eyes: Negative for visual disturbance.  Cardiovascular: Positive for leg swelling.  Gastrointestinal: Negative for constipation and diarrhea.  Genitourinary: Negative for difficulty urinating.  Neurological: Negative for numbness.  has had optho this year.  Has been up on feet more last 2 weeks.     Objective:   Physical Exam  Constitutional: He appears well-developed and well-nourished.  HENT:  Mouth/Throat: No oropharyngeal exudate.  Eyes: EOM are normal. Pupils are equal, round, and reactive to light.  Neck: Neck supple.  Cardiovascular: Normal rate, regular rhythm and normal heart sounds.   Pulmonary/Chest: Effort normal and breath sounds normal.  Abdominal: Soft. Bowel sounds are normal. There is no tenderness. There is no rebound.  Musculoskeletal: He exhibits edema.  Lymphadenopathy:    He has no cervical adenopathy.      Assessment & Plan:

## 2016-05-24 NOTE — Assessment & Plan Note (Signed)
He is doing well His labs are explained He has gotten flu Will give him mening.  Offered/refused condoms.  rtc in 6 months

## 2016-05-25 ENCOUNTER — Ambulatory Visit (INDEPENDENT_AMBULATORY_CARE_PROVIDER_SITE_OTHER): Payer: Medicare Other | Admitting: *Deleted

## 2016-05-25 DIAGNOSIS — J4540 Moderate persistent asthma, uncomplicated: Secondary | ICD-10-CM | POA: Diagnosis not present

## 2016-05-26 ENCOUNTER — Ambulatory Visit: Payer: Medicare Other | Admitting: Podiatry

## 2016-06-09 ENCOUNTER — Ambulatory Visit: Payer: Medicare Other | Admitting: Podiatry

## 2016-06-10 ENCOUNTER — Ambulatory Visit: Payer: Medicare Other

## 2016-06-10 ENCOUNTER — Ambulatory Visit (INDEPENDENT_AMBULATORY_CARE_PROVIDER_SITE_OTHER): Payer: Medicare Other | Admitting: *Deleted

## 2016-06-10 DIAGNOSIS — J4540 Moderate persistent asthma, uncomplicated: Secondary | ICD-10-CM | POA: Diagnosis not present

## 2016-06-20 ENCOUNTER — Telehealth: Payer: Self-pay | Admitting: Endocrinology

## 2016-06-20 NOTE — Telephone Encounter (Signed)
I have sent 3 refills, he needs to be seen once a year

## 2016-06-20 NOTE — Telephone Encounter (Signed)
Patient requesting levothyroxine refill last ov was 12/25/15 and no future appointment scheduled is it ok to refill please advise

## 2016-06-21 ENCOUNTER — Other Ambulatory Visit: Payer: Self-pay | Admitting: Internal Medicine

## 2016-06-24 ENCOUNTER — Ambulatory Visit (INDEPENDENT_AMBULATORY_CARE_PROVIDER_SITE_OTHER): Payer: Medicare Other | Admitting: *Deleted

## 2016-06-24 DIAGNOSIS — J4540 Moderate persistent asthma, uncomplicated: Secondary | ICD-10-CM | POA: Diagnosis not present

## 2016-06-30 ENCOUNTER — Ambulatory Visit: Payer: Medicare Other | Admitting: Podiatry

## 2016-07-08 ENCOUNTER — Ambulatory Visit (INDEPENDENT_AMBULATORY_CARE_PROVIDER_SITE_OTHER): Payer: Medicare Other

## 2016-07-08 DIAGNOSIS — J4540 Moderate persistent asthma, uncomplicated: Secondary | ICD-10-CM | POA: Diagnosis not present

## 2016-07-10 ENCOUNTER — Other Ambulatory Visit: Payer: Self-pay | Admitting: Internal Medicine

## 2016-07-10 ENCOUNTER — Other Ambulatory Visit: Payer: Self-pay | Admitting: Allergy and Immunology

## 2016-07-13 ENCOUNTER — Telehealth: Payer: Self-pay | Admitting: Internal Medicine

## 2016-07-13 DIAGNOSIS — I10 Essential (primary) hypertension: Secondary | ICD-10-CM

## 2016-07-13 MED ORDER — HYDROCHLOROTHIAZIDE 25 MG PO TABS: 25.0000 mg | ORAL_TABLET | Freq: Every day | ORAL | 0 refills | Status: DC

## 2016-07-13 MED ORDER — AMLODIPINE BESYLATE 10 MG PO TABS: 10.0000 mg | ORAL_TABLET | Freq: Every day | ORAL | 0 refills | Status: DC

## 2016-07-13 MED ORDER — POTASSIUM CHLORIDE CRYS ER 20 MEQ PO TBCR: 20.0000 meq | EXTENDED_RELEASE_TABLET | Freq: Every day | ORAL | 0 refills | Status: DC

## 2016-07-13 NOTE — Telephone Encounter (Signed)
Patient needs a refill for amlodipine, hydrochlorothiazide and potassium.  Patient has appt on 1/10

## 2016-07-13 NOTE — Telephone Encounter (Signed)
Refilled x 30 days.

## 2016-07-14 ENCOUNTER — Encounter: Payer: Self-pay | Admitting: Podiatry

## 2016-07-14 ENCOUNTER — Other Ambulatory Visit: Payer: Self-pay | Admitting: Infectious Diseases

## 2016-07-14 ENCOUNTER — Ambulatory Visit (INDEPENDENT_AMBULATORY_CARE_PROVIDER_SITE_OTHER): Payer: PPO | Admitting: Podiatry

## 2016-07-14 VITALS — BP 143/92 | HR 74 | Resp 18

## 2016-07-14 DIAGNOSIS — B20 Human immunodeficiency virus [HIV] disease: Secondary | ICD-10-CM

## 2016-07-14 DIAGNOSIS — B351 Tinea unguium: Secondary | ICD-10-CM | POA: Diagnosis not present

## 2016-07-14 DIAGNOSIS — M79676 Pain in unspecified toe(s): Secondary | ICD-10-CM

## 2016-07-14 NOTE — Progress Notes (Signed)
Subjective: 36 y.o. returns the office today for painful, elongated, thickened toenails which he cannot trim himself. Denies any redness or drainage around the nails. He states that he no longer has any heel pain his feet do not hurt otherwise. Denies any acute changes since last appointment and no new complaints today. Denies any systemic complaints such as fevers, chills, nausea, vomiting.   Objective: AAO 3, NAD DP/PT pulses palpable, CRT less than 3 seconds Nails hypertrophic, dystrophic, elongated, brittle, discolored 10. There is tenderness overlying the nails 1-5 bilaterally. There is no surrounding erythema or drainage along the nail sites. No open lesions or pre-ulcerative lesions are identified. No other areas of tenderness bilateral lower extremities. No overlying edema, erythema, increased warmth. No pain with calf compression, swelling, warmth, erythema.  Assessment: Patient presents with symptomatic onychomycosis  Plan: -Treatment options including alternatives, risks, complications were discussed -Nails sharply debrided 10 without complication/bleeding. -Discussed daily foot inspection. If there are any changes, to call the office immediately.  -Follow-up in 3 months or sooner if any problems are to arise. In the meantime, encouraged to call the office with any questions, concerns, changes symptoms.  Celesta Gentile, DPM

## 2016-07-20 ENCOUNTER — Ambulatory Visit: Payer: PPO | Attending: Internal Medicine | Admitting: Internal Medicine

## 2016-07-20 VITALS — BP 130/79 | HR 85 | Temp 98.8°F | Resp 18 | Ht 73.0 in | Wt 303.0 lb

## 2016-07-20 DIAGNOSIS — Z7984 Long term (current) use of oral hypoglycemic drugs: Secondary | ICD-10-CM | POA: Insufficient documentation

## 2016-07-20 DIAGNOSIS — I129 Hypertensive chronic kidney disease with stage 1 through stage 4 chronic kidney disease, or unspecified chronic kidney disease: Secondary | ICD-10-CM | POA: Diagnosis not present

## 2016-07-20 DIAGNOSIS — E1165 Type 2 diabetes mellitus with hyperglycemia: Secondary | ICD-10-CM | POA: Diagnosis not present

## 2016-07-20 DIAGNOSIS — E118 Type 2 diabetes mellitus with unspecified complications: Secondary | ICD-10-CM | POA: Diagnosis not present

## 2016-07-20 DIAGNOSIS — E119 Type 2 diabetes mellitus without complications: Secondary | ICD-10-CM

## 2016-07-20 DIAGNOSIS — N183 Chronic kidney disease, stage 3 (moderate): Secondary | ICD-10-CM | POA: Insufficient documentation

## 2016-07-20 DIAGNOSIS — Z79899 Other long term (current) drug therapy: Secondary | ICD-10-CM | POA: Diagnosis not present

## 2016-07-20 DIAGNOSIS — B20 Human immunodeficiency virus [HIV] disease: Secondary | ICD-10-CM | POA: Diagnosis not present

## 2016-07-20 DIAGNOSIS — C467 Kaposi's sarcoma of other sites: Secondary | ICD-10-CM | POA: Diagnosis not present

## 2016-07-20 DIAGNOSIS — E1122 Type 2 diabetes mellitus with diabetic chronic kidney disease: Secondary | ICD-10-CM | POA: Diagnosis not present

## 2016-07-20 DIAGNOSIS — Z760 Encounter for issue of repeat prescription: Secondary | ICD-10-CM | POA: Diagnosis not present

## 2016-07-20 DIAGNOSIS — I10 Essential (primary) hypertension: Secondary | ICD-10-CM

## 2016-07-20 LAB — POCT GLYCOSYLATED HEMOGLOBIN (HGB A1C): HEMOGLOBIN A1C: 6.6

## 2016-07-20 LAB — GLUCOSE, POCT (MANUAL RESULT ENTRY): POC GLUCOSE: 151 mg/dL — AB (ref 70–99)

## 2016-07-20 MED ORDER — HYDROCHLOROTHIAZIDE 25 MG PO TABS: 25.0000 mg | ORAL_TABLET | Freq: Every day | ORAL | 3 refills | Status: DC

## 2016-07-20 MED ORDER — AMLODIPINE BESYLATE 10 MG PO TABS: 10.0000 mg | ORAL_TABLET | Freq: Every day | ORAL | 3 refills | Status: DC

## 2016-07-20 MED ORDER — METFORMIN HCL ER 500 MG PO TB24: ORAL_TABLET | ORAL | 3 refills | Status: DC

## 2016-07-20 MED ORDER — POTASSIUM CHLORIDE CRYS ER 20 MEQ PO TBCR: 20.0000 meq | EXTENDED_RELEASE_TABLET | Freq: Every day | ORAL | 3 refills | Status: DC

## 2016-07-20 NOTE — Progress Notes (Signed)
606 

## 2016-07-20 NOTE — Progress Notes (Signed)
Steven [name], is a 36 y.o. male  YOV:[id number]  XAJ:[id number]  DOB - 15-Jul-1980  Chief Complaint  Patient presents with  . Medication Refill      Subjective:   Steven [name] is a 36 y.o. male with history of hypertension, hypothyroidism, DM T2, CKD stage 3, Kaposi's Sarcoma, and HIV here today for a follow up visit and medication refill. Patient is doing very well with diet and exercise as well as adherence with medications. HbA1C is down to 6.6%. He has no complaint today. He follows up regularly with all the specialists including ID for HIV, Oncologist for [place], Allergist/Immunologist for Asthma currently on Immunotherapy, and Endocrinologist for hypothyroidism, he previously had hyperthyroidism which was treated with radioactive iodine in 05/2011, now on synthroid. Patient needs some medication refill. BP and BS are controlled. Patient has No headache, No chest pain, No abdominal pain - No Nausea, No new weakness tingling or numbness, No Cough - SOB.  No problems updated.  ALLERGIES: Allergies  Allergen Reactions  . Lisinopril Swelling    Swelling of lower lip while on lisinopril  . Penicillins Hives and Swelling    REACTION: rash and swelling  . Aspirin Nausea Only    PAST MEDICAL HISTORY: Past Medical History:  Diagnosis Date  . Asthma   . Cancer ([place])   . Diabetes mellitus without complication ([place])   . HIV positive ([place]) 03/23/09   Genotype [id number]  . HTN (hypertension)   . Kaposi's sarcoma   . Syphilis 03/23/09-   1:2    MEDICATIONS AT HOME: Prior to Admission medications   Medication Sig Start Date End Date Taking? Authorizing Provider  acetaminophen (TYLENOL) 500 MG tablet Take 500 mg by mouth every 6 (six) hours as needed. Pain or fever   Yes Historical Provider, MD  albuterol (VENTOLIN HFA) 108 (90 Base) MCG/ACT inhaler Inhale two puffs every 4-6 hours if needed for cough or wheeze 02/12/16  Yes Jiles Prows, MD  amLODipine (NORVASC) 10 MG tablet  Take 1 tablet (10 mg total) by mouth daily. 07/20/16  Yes Tresa Garter, MD  esomeprazole (NEXIUM) 40 MG capsule Take 1 capsule (40 mg total) by mouth daily at 12 noon. Patient taking differently: Take 40 mg by mouth daily as needed.  12/08/15  Yes Jiles Prows, MD  fexofenadine (ALLEGRA) 180 MG tablet Take 1 tablet (180 mg total) by mouth daily. Patient taking differently: Take 180 mg by mouth daily as needed.  05/03/12  Yes Harden Mo, MD  fluorometholone (FML) 0.1 % ophthalmic suspension Place 1 drop into both eyes 2 (two) times daily. 11/27/15  Yes Historical Provider, MD  fluticasone (FLONASE) 50 MCG/ACT nasal spray Place 2 sprays into both nostrils daily. 12/08/15  Yes Jiles Prows, MD  fluticasone-salmeterol (ADVAIR HFA) [phone] MCG/ACT inhaler Inhale two puffs twice daily to prevent cough or wheeze 02/12/16  Yes Jiles Prows, MD  hydrochlorothiazide (HYDRODIURIL) 25 MG tablet Take 1 tablet (25 mg total) by mouth daily. 07/20/16  Yes Tresa Garter, MD  levothyroxine (SYNTHROID, LEVOTHROID) 150 MCG tablet TAKE 1 TABLET(150 MCG) BY MOUTH DAILY 06/20/16  Yes Elayne Snare, MD  metFORMIN (GLUCOPHAGE-XR) 500 MG 24 hr tablet TAKE 3 TABLETS(1500 MG) BY MOUTH DAILY WITH SUPPER 07/20/16  Yes Meril Dray E Doreene Burke, MD  montelukast (SINGULAIR) 10 MG tablet TAKE 1 TABLET(10 MG) BY MOUTH DAILY 05/17/16  Yes Jiles Prows, MD  Multiple Vitamin (MULTIVITAMIN) tablet Take 1 tablet by mouth daily.   Yes  Historical Provider, MD  potassium chloride SA (K-DUR,KLOR-CON) 20 MEQ tablet Take 1 tablet (20 mEq total) by mouth daily. 07/20/16  Yes [place] E Doreene Burke, MD  QVAR 80 MCG/ACT inhaler INHALE 2 PUFFS BY MOUTH TWICE DAILY TO PREVENT COUGH OR WHEEZE, RINSE, GARGLE, AND SPIT AFTER USE Patient taking differently: INHALE 1 PUFFS BY MOUTH TWICE DAILY TO PREVENT COUGH OR WHEEZE, RINSE, GARGLE, AND SPIT AFTER USE 07/30/15  Yes Jiles Prows, MD  TRIUMEQ 600-50-300 MG tablet TAKE 1 TABLET BY MOUTH EVERY DAY 07/14/16   Yes Campbell Riches, MD  TRUE METRIX BLOOD GLUCOSE TEST test strip USE AS DIRECTED 05/17/16  Yes Tresa Garter, MD  TRUEPLUS LANCETS 28G MISC USE TO TEST BLOOD SUGAR ONCE A DAY 07/12/16  Yes Tresa Garter, MD   Objective:   Vitals:   07/20/16 0926  BP: 130/79  Pulse: 85  Resp: 18  Temp: 98.8 F (37.1 C)  TempSrc: Oral  SpO2: 98%  Weight: (!) 303 lb (137.4 kg)  Height: '6\' 1"'$  (1.854 m)   Exam General appearance : Awake, alert, not in any distress. Speech Clear. Not toxic looking, obese HEENT: Atraumatic and Normocephalic, pupils equally reactive to light and accomodation Neck: Supple, no JVD. No cervical lymphadenopathy.  Chest: Good air entry bilaterally, no added sounds  CVS: S1 S2 regular, no murmurs.  Abdomen: Bowel sounds present, Non tender and not distended with no gaurding, rigidity or rebound. Extremities: B/L Lower Ext shows possible lymphedema, patient attends lymphedema clinic Neurology: Awake alert, and oriented X 3, CN II-XII intact, Non focal Skin: Kaposi's Sarcoma   Data Review Lab Results  Component Value Date   HGBA1C 6.6 07/20/2016   HGBA1C 6.8 12/25/2015   HGBA1C 4.90 08/27/2015    Assessment & Plan   1. Type 2 diabetes mellitus without complication, without long-term current use of insulin (HCC)  - POCT A1C - Microalbumin/Creatinine Ratio, Urine - Glucose (CBG) - metFORMIN (GLUCOPHAGE-XR) 500 MG 24 hr tablet; TAKE 3 TABLETS(1500 MG) BY MOUTH DAILY WITH SUPPER  Dispense: 270 tablet; Refill: 3  Aim for 30 minutes of exercise most days. Rethink what you drink. Water is great! Aim for 2-3 Carb Choices per meal (30-45 grams) +/- 1 either way  Aim for 0-15 Carbs per snack if hungry  Include protein in moderation with your meals and snacks  Consider reading food labels for Total Carbohydrate and Fat Grams of foods  Consider checking BG at alternate times per day  Continue taking medication as directed Be mindful about how much sugar you  are adding to beverages and other foods. Fruit Punch - find one with no sugar  Measure and decrease portions of carbohydrate foods  Make your plate and don't go back for seconds  2. Essential hypertension  - potassium chloride SA (K-DUR,KLOR-CON) 20 MEQ tablet; Take 1 tablet (20 mEq total) by mouth daily.  Dispense: 90 tablet; Refill: 3 - hydrochlorothiazide (HYDRODIURIL) 25 MG tablet; Take 1 tablet (25 mg total) by mouth daily.  Dispense: 90 tablet; Refill: 3 - amLODipine (NORVASC) 10 MG tablet; Take 1 tablet (10 mg total) by mouth daily.  Dispense: 90 tablet; Refill: 3  3. Human immunodeficiency virus (HIV) disease ([place])  - Continue to follow up with ID specialist - Continue current medications  Patient have been counseled extensively about nutrition and exercise. Other issues discussed during this visit include: low cholesterol diet, weight control and daily exercise, foot care, annual eye examinations at Ophthalmology, importance of adherence with medications  and regular follow-up. We also discussed long term complications of uncontrolled diabetes and hypertension.   Return in about 6 months (around 01/17/2017) for Follow up HTN, Follow up Pain and comorbidities.  The patient was given clear instructions to go to ER or return to medical center if symptoms don't improve, worsen or new problems develop. The patient verbalized understanding. The patient was told to call to get lab results if they haven't heard anything in the next week.   This note has been created with Surveyor, quantity. Any transcriptional errors are unintentional.    Angelica Chessman, MD, [place], [place], [place], [place] and [place], [place]   07/20/2016, 10:16 AM

## 2016-07-20 NOTE — Patient Instructions (Signed)
Diabetes and Foot Care Diabetes may cause you to have problems because of poor blood supply (circulation) to your feet and legs. This may cause the skin on your feet to become thinner, break easier, and heal more slowly. Your skin may become dry, and the skin may peel and crack. You may also have nerve damage in your legs and feet causing decreased feeling in them. You may not notice minor injuries to your feet that could lead to infections or more serious problems. Taking care of your feet is one of the most important things you can do for yourself. Follow these instructions at home:  Wear shoes at all times, even in the house. Do not go barefoot. Bare feet are easily injured.  Check your feet daily for blisters, cuts, and redness. If you cannot see the bottom of your feet, use a mirror or ask someone for help.  Wash your feet with warm water (do not use hot water) and mild soap. Then pat your feet and the areas between your toes until they are completely dry. Do not soak your feet as this can dry your skin.  Apply a moisturizing lotion or petroleum jelly (that does not contain alcohol and is unscented) to the skin on your feet and to dry, brittle toenails. Do not apply lotion between your toes.  Trim your toenails straight across. Do not dig under them or around the cuticle. File the edges of your nails with an emery board or nail file.  Do not cut corns or calluses or try to remove them with medicine.  Wear clean socks or stockings every day. Make sure they are not too tight. Do not wear knee-high stockings since they may decrease blood flow to your legs.  Wear shoes that fit properly and have enough cushioning. To break in new shoes, wear them for just a few hours a day. This prevents you from injuring your feet. Always look in your shoes before you put them on to be sure there are no objects inside.  Do not cross your legs. This may decrease the blood flow to your feet.  If you find a  minor scrape, cut, or break in the skin on your feet, keep it and the skin around it clean and dry. These areas may be cleansed with mild soap and water. Do not cleanse the area with peroxide, alcohol, or iodine.  When you remove an adhesive bandage, be sure not to damage the skin around it.  If you have a wound, look at it several times a day to make sure it is healing.  Do not use heating pads or hot water bottles. They may burn your skin. If you have lost feeling in your feet or legs, you may not know it is happening until it is too late.  Make sure your health care provider performs a complete foot exam at least annually or more often if you have foot problems. Report any cuts, sores, or bruises to your health care provider immediately. Contact a health care provider if:  You have an injury that is not healing.  You have cuts or breaks in the skin.  You have an ingrown nail.  You notice redness on your legs or feet.  You feel burning or tingling in your legs or feet.  You have pain or cramps in your legs and feet.  Your legs or feet are numb.  Your feet always feel cold. Get help right away if:  There is increasing   redness, swelling, or pain in or around a wound.  There is a red line that goes up your leg.  Pus is coming from a wound.  You develop a fever or as directed by your health care provider.  You notice a bad smell coming from an ulcer or wound. This information is not intended to replace advice given to you by your health care provider. Make sure you discuss any questions you have with your health care provider. Document Released: 06/24/2000 Document Revised: 12/03/2015 Document Reviewed: 12/04/2012 Elsevier Interactive Patient Education  2017 [place]. Blood Glucose Monitoring, Adult Monitoring your blood sugar (glucose) helps you manage your diabetes. It also helps you and your health care provider determine how well your diabetes management plan is  working. Blood glucose monitoring involves checking your blood glucose as often as directed, and keeping a record (log) of your results over time. Why should I monitor my blood glucose? Checking your blood glucose regularly can:  Help you understand how food, exercise, illnesses, and medicines affect your blood glucose.  Let you know what your blood glucose is at any time. You can quickly tell if you are having low blood glucose (hypoglycemia) or high blood glucose (hyperglycemia).  Help you and your health care provider adjust your medicines as needed. When should I check my blood glucose? Follow instructions from your health care provider about how often to check your blood glucose. This may depend on:  The type of diabetes you have.  How well-controlled your diabetes is.  Medicines you are taking. If you have type 1 diabetes:  Check your blood glucose at least 2 times a day.  Also check your blood glucose:  Before every insulin injection.  Before and after exercise.  Between meals.  2 hours after a meal.  Occasionally between 2:00 a.m. and 3:00 a.m., as directed.  Before potentially dangerous tasks, like driving or using heavy machinery.  At bedtime.  You may need to check your blood glucose more often, up to 6-10 times a day:  If you use an insulin pump.  If you need multiple daily injections (MDI).  If your diabetes is not well-controlled.  If you are ill.  If you have a history of severe hypoglycemia.  If you have a history of not knowing when your blood glucose is getting low (hypoglycemia unawareness). If you have type 2 diabetes:  If you take insulin or other diabetes medicines, check your blood glucose at least 2 times a day.  If you are on intensive insulin therapy, check your blood glucose at least 4 times a day. Occasionally, you may also need to check between 2:00 a.m. and 3:00 a.m., as directed.  Also check your blood glucose:  Before and after  exercise.  Before potentially dangerous tasks, like driving or using heavy machinery.  You may need to check your blood glucose more often if:  Your medicine is being adjusted.  Your diabetes is not well-controlled.  You are ill. What is a blood glucose log?  A blood glucose log is a record of your blood glucose readings. It helps you and your health care provider:  Look for patterns in your blood glucose over time.  Adjust your diabetes management plan as needed.  Every time you check your blood glucose, write down your result and notes about things that may be affecting your blood glucose, such as your diet and exercise for the day.  Most glucose meters store a record of glucose readings in  the meter. Some meters allow you to download your records to a computer. How do I check my blood glucose? Follow these steps to get accurate readings of your blood glucose: Supplies needed   Blood glucose meter.  Test strips for your meter. Each meter has its own strips. You must use the strips that come with your meter.  A needle to prick your finger (lancet). Do not use lancets more than once.  A device that holds the lancet (lancing device).  A journal or log book to write down your results. Procedure  Wash your hands with soap and water.  Prick the side of your finger (not the tip) with the lancet. Use a different finger each time.  Gently rub the finger until a small drop of blood appears.  Follow instructions that come with your meter for inserting the test strip, applying blood to the strip, and using your blood glucose meter.  Write down your result and any notes. Alternative testing sites  Some meters allow you to use areas of your body other than your finger (alternative sites) to test your blood.  If you think you may have hypoglycemia, or if you have hypoglycemia unawareness, do not use alternative sites. Use your finger instead.  Alternative sites may not be as  accurate as the fingers, because blood flow is slower in these areas. This means that the result you get may be delayed, and it may be different from the result that you would get from your finger.  The most common alternative sites are:  Forearm.  Thigh.  Palm of the hand. Additional tips  Always keep your supplies with you.  If you have questions or need help, all blood glucose meters have a 24-hour "hotline" number that you can call. You may also contact your health care provider.  After you use a few boxes of test strips, adjust (calibrate) your blood glucose meter by following instructions that came with your meter. This information is not intended to replace advice given to you by your health care provider. Make sure you discuss any questions you have with your health care provider. Document Released: 06/30/2003 Document Revised: 01/15/2016 Document Reviewed: 12/07/2015 Elsevier Interactive Patient Education  2017 [employer] Hypertension Hypertension, commonly called high blood pressure, is when the force of blood pumping through your arteries is too strong. Your arteries are the blood vessels that carry blood from your heart throughout your body. A blood pressure reading consists of a higher number over a lower number, such as 110/72. The higher number (systolic) is the pressure inside your arteries when your heart pumps. The lower number (diastolic) is the pressure inside your arteries when your heart relaxes. Ideally you want your blood pressure below 120/80. Hypertension forces your heart to work harder to pump blood. Your arteries may become narrow or stiff. Having untreated or uncontrolled hypertension can cause heart attack, stroke, kidney disease, and other problems. What increases the risk? Some risk factors for high blood pressure are controllable. Others are not. Risk factors you cannot control include:  Race. You may be at higher risk if you are African  American.  Age. Risk increases with age.  Gender. Men are at higher risk than women before age 77 years. After age 20, women are at higher risk than men. Risk factors you can control include:  Not getting enough exercise or physical activity.  Being overweight.  Getting too much fat, sugar, calories, or salt in your diet.  Drinking too much  alcohol. What are the signs or symptoms? Hypertension does not usually cause signs or symptoms. Extremely high blood pressure (hypertensive crisis) may cause headache, anxiety, shortness of breath, and nosebleed. How is this diagnosed? To check if you have hypertension, your health care provider will measure your blood pressure while you are seated, with your arm held at the level of your heart. It should be measured at least twice using the same arm. Certain conditions can cause a difference in blood pressure between your right and left arms. A blood pressure reading that is higher than normal on one occasion does not mean that you need treatment. If it is not clear whether you have high blood pressure, you may be asked to return on a different day to have your blood pressure checked again. Or, you may be asked to monitor your blood pressure at home for 1 or more weeks. How is this treated? Treating high blood pressure includes making lifestyle changes and possibly taking medicine. Living a healthy lifestyle can help lower high blood pressure. You may need to change some of your habits. Lifestyle changes may include:  Following the DASH diet. This diet is high in fruits, vegetables, and whole grains. It is low in salt, red meat, and added sugars.  Keep your sodium intake below 2,300 mg per day.  Getting at least 30-45 minutes of aerobic exercise at least 4 times per week.  Losing weight if necessary.  Not smoking.  Limiting alcoholic beverages.  Learning ways to reduce stress. Your health care provider may prescribe medicine if lifestyle  changes are not enough to get your blood pressure under control, and if one of the following is true:  You are 60-84 years of age and your systolic blood pressure is above 140.  You are 47 years of age or older, and your systolic blood pressure is above 150.  Your diastolic blood pressure is above 90.  You have diabetes, and your systolic blood pressure is over 253 or your diastolic blood pressure is over 90.  You have kidney disease and your blood pressure is above 140/90.  You have heart disease and your blood pressure is above 140/90. Your personal target blood pressure may vary depending on your medical conditions, your age, and other factors. Follow these instructions at home:  Have your blood pressure rechecked as directed by your health care provider.  Take medicines only as directed by your health care provider. Follow the directions carefully. Blood pressure medicines must be taken as prescribed. The medicine does not work as well when you skip doses. Skipping doses also puts you at risk for problems.  Do not smoke.  Monitor your blood pressure at home as directed by your health care provider. Contact a health care provider if:  You think you are having a reaction to medicines taken.  You have recurrent headaches or feel dizzy.  You have swelling in your ankles.  You have trouble with your vision. Get help right away if:  You develop a severe headache or confusion.  You have unusual weakness, numbness, or feel faint.  You have severe chest or abdominal pain.  You vomit repeatedly.  You have trouble breathing. This information is not intended to replace advice given to you by your health care provider. Make sure you discuss any questions you have with your health care provider. Document Released: 06/27/2005 Document Revised: 12/03/2015 Document Reviewed: 04/19/2013 Elsevier Interactive Patient Education  2017 [place] DASH stands for  "  Dietary Approaches to Stop Hypertension." The DASH eating plan is a healthy eating plan that has been shown to reduce high blood pressure (hypertension). Additional health benefits may include reducing the risk of type 2 diabetes mellitus, heart disease, and stroke. The DASH eating plan may also help with weight loss. What do I need to know about the DASH eating plan? For the DASH eating plan, you will follow these general guidelines:  Choose foods with less than 150 milligrams of sodium per serving (as listed on the food label).  Use salt-free seasonings or herbs instead of table salt or sea salt.  Check with your health care provider or pharmacist before using salt substitutes.  Eat lower-sodium products. These are often labeled as "low-sodium" or "no salt added."  Eat fresh foods. Avoid eating a lot of canned foods.  Eat more vegetables, fruits, and low-fat dairy products.  Choose whole grains. Look for the word "whole" as the first word in the ingredient list.  Choose fish and skinless chicken or [place] more often than red meat. Limit fish, poultry, and meat to 6 oz (170 g) each day.  Limit sweets, desserts, sugars, and sugary drinks.  Choose heart-healthy fats.  Eat more home-cooked food and less restaurant, buffet, and fast food.  Limit fried foods.  Do not fry foods. Cook foods using methods such as baking, boiling, grilling, and broiling instead.  When eating at a restaurant, ask that your food be prepared with less salt, or no salt if possible. What foods can I eat? Seek help from a dietitian for individual calorie needs. Grains  Whole grain or whole wheat bread. Brown rice. Whole grain or whole wheat pasta. Quinoa, bulgur, and whole grain cereals. Low-sodium cereals. Corn or whole wheat flour tortillas. Whole grain cornbread. Whole grain crackers. Low-sodium crackers. Vegetables  Fresh or frozen vegetables (raw, steamed, roasted, or grilled). Low-sodium or  reduced-sodium tomato and vegetable juices. Low-sodium or reduced-sodium tomato sauce and paste. Low-sodium or reduced-sodium canned vegetables. Fruits  All fresh, canned (in natural juice), or frozen fruits. Meat and Other Protein Products  Ground beef (85% or leaner), grass-fed beef, or beef trimmed of fat. Skinless chicken or [place]. Ground chicken or [place]. Pork trimmed of fat. All fish and seafood. Eggs. Dried beans, peas, or lentils. Unsalted nuts and seeds. Unsalted canned beans. Dairy  Low-fat dairy products, such as skim or 1% milk, 2% or reduced-fat cheeses, low-fat ricotta or cottage cheese, or plain low-fat yogurt. Low-sodium or reduced-sodium cheeses. Fats and Oils  Tub margarines without trans fats. Light or reduced-fat mayonnaise and salad dressings (reduced sodium). Avocado. Safflower, olive, or canola oils. Natural peanut or almond butter. Other  Unsalted popcorn and pretzels. The items listed above may not be a complete list of recommended foods or beverages. Contact your dietitian for more options.  What foods are not recommended? Grains  White bread. White pasta. White rice. Refined cornbread. Bagels and croissants. Crackers that contain trans fat. Vegetables  Creamed or fried vegetables. Vegetables in a cheese sauce. Regular canned vegetables. Regular canned tomato sauce and paste. Regular tomato and vegetable juices. Fruits  Canned fruit in light or heavy syrup. Fruit juice. Meat and Other Protein Products  Fatty cuts of meat. Ribs, chicken wings, bacon, sausage, bologna, salami, chitterlings, fatback, hot dogs, bratwurst, and packaged luncheon meats. Salted nuts and seeds. Canned beans with salt. Dairy  Whole or 2% milk, cream, half-and-half, and cream cheese. Whole-fat or sweetened yogurt. Full-fat cheeses or blue cheese. Nondairy creamers and  whipped toppings. Processed cheese, cheese spreads, or cheese curds. Condiments  Onion and garlic salt, seasoned salt, table  salt, and sea salt. Canned and packaged gravies. Worcestershire sauce. Tartar sauce. Barbecue sauce. Teriyaki sauce. Soy sauce, including reduced sodium. Steak sauce. Fish sauce. Oyster sauce. Cocktail sauce. Horseradish. Ketchup and mustard. Meat flavorings and tenderizers. Bouillon cubes. Hot sauce. Tabasco sauce. Marinades. Taco seasonings. Relishes. Fats and Oils  Butter, stick margarine, lard, shortening, ghee, and bacon fat. Coconut, palm kernel, or palm oils. Regular salad dressings. Other  Pickles and olives. Salted popcorn and pretzels. The items listed above may not be a complete list of foods and beverages to avoid. Contact your dietitian for more information.  Where can I find more information? National Heart, [employer], and [employer] Institute: travelstabloid.com This information is not intended to replace advice given to you by your health care provider. Make sure you discuss any questions you have with your health care provider. Document Released: 06/16/2011 Document Revised: 12/03/2015 Document Reviewed: 05/01/2013 Elsevier Interactive Patient Education  2017 [employer].

## 2016-07-20 NOTE — Progress Notes (Signed)
Patient is here for Med Refill  Patient denies pain at this time.  Patient has not eaten today. Patient has taken medication today.

## 2016-07-21 LAB — MICROALBUMIN / CREATININE URINE RATIO
Creatinine, Urine: 138 mg/dL (ref 20–370)
MICROALB UR: 4.3 mg/dL
MICROALB/CREAT RATIO: 31 ug/mg{creat} — AB (ref <30)

## 2016-07-26 ENCOUNTER — Ambulatory Visit: Payer: Medicare Other | Admitting: Allergy and Immunology

## 2016-08-10 ENCOUNTER — Other Ambulatory Visit: Payer: Self-pay | Admitting: Allergy and Immunology

## 2016-08-12 ENCOUNTER — Other Ambulatory Visit: Payer: Self-pay | Admitting: Pharmacist

## 2016-08-12 MED ORDER — GLUCOSE BLOOD VI STRP: ORAL_STRIP | 12 refills | Status: DC

## 2016-08-18 ENCOUNTER — Ambulatory Visit: Payer: PPO

## 2016-08-18 DIAGNOSIS — H10413 Chronic giant papillary conjunctivitis, bilateral: Secondary | ICD-10-CM | POA: Diagnosis not present

## 2016-08-18 DIAGNOSIS — H05243 Constant exophthalmos, bilateral: Secondary | ICD-10-CM | POA: Diagnosis not present

## 2016-08-18 DIAGNOSIS — E119 Type 2 diabetes mellitus without complications: Secondary | ICD-10-CM | POA: Diagnosis not present

## 2016-08-18 LAB — HM DIABETES EYE EXAM

## 2016-08-19 ENCOUNTER — Ambulatory Visit: Payer: Self-pay

## 2016-08-25 ENCOUNTER — Encounter: Payer: Self-pay | Admitting: Infectious Diseases

## 2016-08-30 ENCOUNTER — Encounter: Payer: Self-pay | Admitting: Allergy and Immunology

## 2016-08-30 ENCOUNTER — Ambulatory Visit (INDEPENDENT_AMBULATORY_CARE_PROVIDER_SITE_OTHER): Payer: PPO | Admitting: Allergy and Immunology

## 2016-08-30 VITALS — BP 130/100 | HR 84 | Resp 22

## 2016-08-30 DIAGNOSIS — J4550 Severe persistent asthma, uncomplicated: Secondary | ICD-10-CM | POA: Diagnosis not present

## 2016-08-30 DIAGNOSIS — K219 Gastro-esophageal reflux disease without esophagitis: Secondary | ICD-10-CM

## 2016-08-30 DIAGNOSIS — B20 Human immunodeficiency virus [HIV] disease: Secondary | ICD-10-CM

## 2016-08-30 DIAGNOSIS — J3089 Other allergic rhinitis: Secondary | ICD-10-CM | POA: Diagnosis not present

## 2016-08-30 MED ORDER — BECLOMETHASONE DIPROP HFA 80 MCG/ACT IN AERB: 2.0000 | INHALATION_SPRAY | Freq: Two times a day (BID) | RESPIRATORY_TRACT | 5 refills | Status: DC

## 2016-08-30 NOTE — Progress Notes (Signed)
Follow-up Note  Referring Provider: Tresa Garter, MD Primary Provider: Angelica Chessman, MD Date of Office Visit: 08/30/2016  Subjective:   Steven [name] (DOB: March 10, 1981) is a 36 y.o. male who returns to the [facility] [place] on 08/30/2016 in re-evaluation of the following:  HPI: Erling returns to this clinic in reevaluation of his asthma and allergic rhinitis and reflux. I have not seen him in this clinic since June 2017.  His asthma has really been doing quite well. He has not required a systemic steroid to treat an exacerbation and rarely uses a short acting bronchodilator. He's now using a combination of omalizumab injections and Advair and Qvar and montelukast.  He's had very little problems with his upper airways. It does not sound as though he has required a antibiotic to treat an episode of sinusitis. He continues on Flonase. He continues on eyedrops from his ophthalmologist.  Reflux has not been a problem. He continues on Nexium one time per day. He's lost 15 pounds of weight voluntarily.  He did obtain the flu vaccine.  H[place] informs me that his HIV RNA is undetectable on his current plan.  Allergies as of 08/30/2016      Reactions   Lisinopril Swelling   Swelling of lower lip while on lisinopril   Penicillins Hives, Swelling   REACTION: rash and swelling   Aspirin Nausea Only      Medication List      acetaminophen 500 MG tablet Commonly known as:  TYLENOL Take 500 mg by mouth every 6 (six) hours as needed. Pain or fever   albuterol 108 (90 Base) MCG/ACT inhaler Commonly known as:  VENTOLIN HFA Inhale two puffs every 4-6 hours if needed for cough or wheeze   amLODipine 10 MG tablet Commonly known as:  NORVASC Take 1 tablet (10 mg total) by mouth daily.   esomeprazole 40 MG capsule Commonly known as:  NEXIUM Take one capsule once daily as directed   fexofenadine 180 MG tablet Commonly known as:  ALLEGRA Take 1 tablet (180 mg total)  by mouth daily.   fluorometholone 0.1 % ophthalmic suspension Commonly known as:  FML Place 1 drop into both eyes 2 (two) times daily.   fluticasone 50 MCG/ACT nasal spray Commonly known as:  FLONASE Use two sprays in each nostril daily as directed   fluticasone-salmeterol 230-21 MCG/ACT inhaler Commonly known as:  ADVAIR HFA Inhale two puffs twice daily to prevent cough or wheeze   glucose blood test strip Commonly known as:  TRUETEST TEST Use as instructed for 3 times daily testing of blood glucose   hydrochlorothiazide 25 MG tablet Commonly known as:  HYDRODIURIL Take 1 tablet (25 mg total) by mouth daily.   levothyroxine 150 MCG tablet Commonly known as:  SYNTHROID, LEVOTHROID TAKE 1 TABLET(150 MCG) BY MOUTH DAILY   metFORMIN 500 MG 24 hr tablet Commonly known as:  GLUCOPHAGE-XR TAKE 3 TABLETS(1500 MG) BY MOUTH DAILY WITH SUPPER   montelukast 10 MG tablet Commonly known as:  SINGULAIR Take one tablet once daily as directed   multivitamin tablet Take 1 tablet by mouth daily.   potassium chloride SA 20 MEQ tablet Commonly known as:  K-DUR,KLOR-CON Take 1 tablet (20 mEq total) by mouth daily.   QVAR 80 MCG/ACT inhaler Generic drug:  beclomethasone INHALE 2 PUFFS BY MOUTH TWICE DAILY TO PREVENT COUGH OR WHEEZE, RINSE, GARGLE, AND SPIT AFTER USE   TRIUMEQ 600-50-300 MG tablet Generic drug:  abacavir-dolutegravir-lamiVUDine TAKE 1 TABLET  BY MOUTH EVERY DAY   TRUEPLUS LANCETS 28G Misc USE TO TEST BLOOD SUGAR ONCE A DAY       Past Medical History:  Diagnosis Date  . Asthma   . Cancer ([place])   . Diabetes mellitus without complication ([place])   . HIV positive ([place]) 03/23/09   Genotype Y181C  . HTN (hypertension)   . Kaposi's sarcoma   . Syphilis 03/23/09-   1:2    Past Surgical History:  Procedure Laterality Date  . BRAIN SURGERY    . IR GENERIC HISTORICAL  02/12/2016   IR REMOVAL TUN ACCESS W/ PORT W/O FL MOD SED 02/12/2016 [name], MD [facility]     Review of systems negative except as noted in HPI / PMHx or noted below:  Review of Systems  Constitutional: Negative.   HENT: Negative.   Eyes: Negative.   Respiratory: Negative.   Cardiovascular: Negative.   Gastrointestinal: Negative.   Genitourinary: Negative.   Musculoskeletal: Negative.   Skin: Negative.   Neurological: Negative.   Endo/Heme/Allergies: Negative.   Psychiatric/Behavioral: Negative.      Objective:   Vitals:   08/30/16 1757  BP: (!) 130/100  Pulse: 84  Resp: (!) 22          Physical Exam  Constitutional: He is well-developed, well-nourished, and in no distress.  HENT:  Head: Normocephalic.  Right Ear: Tympanic membrane, external ear and ear canal normal.  Left Ear: Tympanic membrane, external ear and ear canal normal.  Nose: Nose normal. No mucosal edema or rhinorrhea.  Mouth/Throat: Uvula is midline, oropharynx is clear and moist and mucous membranes are normal. No oropharyngeal exudate.  Eyes: Right conjunctiva is injected. Left conjunctiva is injected.  Neck: Trachea normal. No tracheal tenderness present. No tracheal deviation present. No thyromegaly present.  Cardiovascular: Normal rate, regular rhythm, S1 normal, S2 normal and normal heart sounds.   No murmur heard. Pulmonary/Chest: Breath sounds normal. No stridor. No respiratory distress. He has no wheezes. He has no rales.  Musculoskeletal: He exhibits no edema.  Lymphadenopathy:       Head (right side): No tonsillar adenopathy present.       Head (left side): No tonsillar adenopathy present.    He has no cervical adenopathy.  Neurological: He is alert. Gait normal.  Skin: No rash noted. He is not diaphoretic. No erythema. Nails show no clubbing.  Psychiatric: Mood and affect normal.    Diagnostics:    Spirometry was performed and demonstrated an FEV1 of 2.13 at 53 % of predicted.  Assessment and Plan:   1. Asthma, severe persistent, well-controlled   2. Other allergic  rhinitis   3. Gastroesophageal reflux disease, esophagitis presence not specified   4. HIV (human immunodeficiency virus infection) ([place])     1. Continue Xolair and Epi-Pen  2. Continue Advair 230 2 inhalations twice a day: Decrease Qvar 80 Redihaler one inhalations twice a day  3. Continue montelukast 10 mg daily  4. Continue Flonase 1-2 sprays each nostril one time per day  5. Continue Nexium 40 mg once a day if needed  6. Continue ophthalmology prescribed eyedrops as needed  7. Continue Ventolin HFA 2 puffs every 4-6 hours if needed  8. Continue fexofenadine/Allegra 180 one time per day if needed  9. Return to clinic in Summer 2018 or earlier if problem. TAPER?   Kaine appears to be doing relatively well. Now that he has started his Xolair injections we'll see if we can taper down some of  his other medications initially starting with a taper of his inhaled beclomethasone as noted above. I'll see him back in this clinic in the summer of 2018 and if he continues to do well will eliminate the use of his Qvar. Of course, he needs to get through this upcoming springtime season which hopefully is not going to be as bad as it has been in the past now that he is on omalizumab injections. He will contact me should he develop significant problems in the face of this plan but otherwise I'll see him back in this clinic in the summer.  Allena Katz, MD Allergy / Immunology [place]

## 2016-08-30 NOTE — Patient Instructions (Addendum)
  1. Continue Xolair and Epi-Pen  2. Continue Advair 230 2 inhalations twice a day: Decrease Qvar 80 Redihaler one inhalations twice a day  3. Continue montelukast 10 mg daily  4. Continue Flonase 1-2 sprays each nostril one time per day  5. Continue Nexium 40 mg once a day if needed  6. Continue ophthalmology prescribed eyedrops as needed  7. Continue Ventolin HFA 2 puffs every 4-6 hours if needed  8. Continue fexofenadine/Allegra 180 one time per day if needed  9. Return to clinic in Summer 2018 or earlier if problem. TAPER?

## 2016-09-13 ENCOUNTER — Ambulatory Visit: Payer: PPO

## 2016-09-20 ENCOUNTER — Other Ambulatory Visit: Payer: Self-pay | Admitting: Internal Medicine

## 2016-09-20 DIAGNOSIS — I10 Essential (primary) hypertension: Secondary | ICD-10-CM

## 2016-09-22 ENCOUNTER — Other Ambulatory Visit: Payer: Self-pay | Admitting: Allergy and Immunology

## 2016-09-23 ENCOUNTER — Other Ambulatory Visit: Payer: Self-pay | Admitting: *Deleted

## 2016-09-23 MED ORDER — FLUTICASONE PROPIONATE HFA 110 MCG/ACT IN AERO: 2.0000 | INHALATION_SPRAY | Freq: Two times a day (BID) | RESPIRATORY_TRACT | 5 refills | Status: DC

## 2016-10-13 ENCOUNTER — Other Ambulatory Visit: Payer: Self-pay | Admitting: Endocrinology

## 2016-10-24 ENCOUNTER — Ambulatory Visit: Payer: PPO | Admitting: Podiatry

## 2016-10-27 ENCOUNTER — Ambulatory Visit: Payer: PPO | Admitting: Podiatry

## 2016-11-07 ENCOUNTER — Other Ambulatory Visit: Payer: Self-pay | Admitting: Endocrinology

## 2016-11-07 ENCOUNTER — Other Ambulatory Visit: Payer: Medicare Other

## 2016-11-07 NOTE — Telephone Encounter (Signed)
Pt has 4 days left of the thyroid med

## 2016-11-09 ENCOUNTER — Other Ambulatory Visit: Payer: PPO

## 2016-11-10 ENCOUNTER — Ambulatory Visit: Payer: PPO | Admitting: Podiatry

## 2016-11-16 ENCOUNTER — Other Ambulatory Visit: Payer: PPO

## 2016-11-16 ENCOUNTER — Other Ambulatory Visit (HOSPITAL_COMMUNITY)
Admission: RE | Admit: 2016-11-16 | Discharge: 2016-11-16 | Disposition: A | Discharge status: Home / Self Care | Payer: PPO | Source: Ambulatory Visit | Attending: Infectious Diseases | Admitting: Infectious Diseases

## 2016-11-16 DIAGNOSIS — Z113 Encounter for screening for infections with a predominantly sexual mode of transmission: Secondary | ICD-10-CM | POA: Diagnosis not present

## 2016-11-16 DIAGNOSIS — Z79899 Other long term (current) drug therapy: Secondary | ICD-10-CM | POA: Diagnosis not present

## 2016-11-16 DIAGNOSIS — B20 Human immunodeficiency virus [HIV] disease: Secondary | ICD-10-CM | POA: Diagnosis not present

## 2016-11-16 LAB — CBC
HEMATOCRIT: 48.5 % (ref 38.5–50.0)
HEMOGLOBIN: 15.6 g/dL (ref 13.2–17.1)
MCH: 25.4 pg — AB (ref 27.0–33.0)
MCHC: 32.2 g/dL (ref 32.0–36.0)
MCV: 78.9 fL — ABNORMAL LOW (ref 80.0–100.0)
Platelets: 215 10*3/uL (ref 140–400)
RBC: 6.15 MIL/uL — ABNORMAL HIGH (ref 4.20–5.80)
RDW: 15.1 % — ABNORMAL HIGH (ref 11.0–15.0)
WBC: 6.1 10*3/uL (ref 3.8–10.8)

## 2016-11-16 LAB — COMPREHENSIVE METABOLIC PANEL
ALBUMIN: 4.5 g/dL (ref 3.6–5.1)
ALT: 35 U/L (ref 9–46)
AST: 23 U/L (ref 10–40)
Alkaline Phosphatase: 51 U/L (ref 40–115)
BILIRUBIN TOTAL: 0.9 mg/dL (ref 0.2–1.2)
BUN: 12 mg/dL (ref 7–25)
CALCIUM: 9.5 mg/dL (ref 8.6–10.3)
CHLORIDE: 96 mmol/L — AB (ref 98–110)
CO2: 30 mmol/L (ref 20–31)
Creat: 1.4 mg/dL — ABNORMAL HIGH (ref 0.60–1.35)
Glucose, Bld: 214 mg/dL — ABNORMAL HIGH (ref 65–99)
Potassium: 3.2 mmol/L — ABNORMAL LOW (ref 3.5–5.3)
Sodium: 140 mmol/L (ref 135–146)
Total Protein: 7.6 g/dL (ref 6.1–8.1)

## 2016-11-16 LAB — LIPID PANEL
Cholesterol: 146 mg/dL (ref <200)
HDL: 34 mg/dL — AB (ref >40)
LDL CALC: 74 mg/dL (ref <100)
TRIGLYCERIDES: 192 mg/dL — AB (ref <150)
Total CHOL/HDL Ratio: 4.3 Ratio (ref <5.0)
VLDL: 38 mg/dL — ABNORMAL HIGH (ref <30)

## 2016-11-17 ENCOUNTER — Telehealth: Payer: Self-pay | Admitting: *Deleted

## 2016-11-17 ENCOUNTER — Ambulatory Visit (INDEPENDENT_AMBULATORY_CARE_PROVIDER_SITE_OTHER): Payer: PPO | Admitting: *Deleted

## 2016-11-17 DIAGNOSIS — J4540 Moderate persistent asthma, uncomplicated: Secondary | ICD-10-CM

## 2016-11-17 LAB — URINE CYTOLOGY ANCILLARY ONLY
CHLAMYDIA, DNA PROBE: NEGATIVE
NEISSERIA GONORRHEA: NEGATIVE

## 2016-11-17 LAB — T-HELPER CELL (CD4) - (RCID CLINIC ONLY)
CD4 % Helper T Cell: 18 % — ABNORMAL LOW (ref 33–55)
CD4 T Cell Abs: 510 /uL (ref 400–2700)

## 2016-11-17 LAB — RPR

## 2016-11-17 NOTE — Telephone Encounter (Signed)
Called patient per Dr Algis Downs request.  Had to leave a message asking patient to call back. Patient is currently prescribed oral potassium supplement by Dr Doreene Burke.  RN left message asking if he was still taking them, asked him to call back for a recheck.   Landis Gandy, RN

## 2016-11-17 NOTE — Telephone Encounter (Signed)
-----   Message from Campbell Riches, MD sent at 11/17/2016  8:27 AM EDT ----- Please pt KCL 2mq po x 1  Repeat BMP Thanks

## 2016-11-18 LAB — HIV-1 RNA QUANT-NO REFLEX-BLD
HIV 1 RNA Quant: <20 copies/mL
HIV-1 RNA Quant, Log: <1.3 Log copies/mL

## 2016-11-21 ENCOUNTER — Ambulatory Visit: Payer: PPO | Admitting: Family Medicine

## 2016-11-23 ENCOUNTER — Ambulatory Visit: Payer: Medicare Other | Admitting: Infectious Diseases

## 2016-11-24 ENCOUNTER — Other Ambulatory Visit: Payer: PPO

## 2016-11-25 ENCOUNTER — Ambulatory Visit: Payer: PPO | Admitting: Primary Care

## 2016-11-25 ENCOUNTER — Ambulatory Visit: Payer: PPO | Admitting: Internal Medicine

## 2016-11-29 ENCOUNTER — Ambulatory Visit (INDEPENDENT_AMBULATORY_CARE_PROVIDER_SITE_OTHER): Payer: PPO | Admitting: Endocrinology

## 2016-11-29 ENCOUNTER — Encounter: Payer: Self-pay | Admitting: Endocrinology

## 2016-11-29 VITALS — BP 136/98 | HR 82 | Ht 73.0 in | Wt 295.6 lb

## 2016-11-29 DIAGNOSIS — E119 Type 2 diabetes mellitus without complications: Secondary | ICD-10-CM

## 2016-11-29 DIAGNOSIS — E890 Postprocedural hypothyroidism: Secondary | ICD-10-CM

## 2016-11-29 NOTE — Addendum Note (Signed)
Addended by: Kaylyn Lim I on: 11/29/2016 04:27 PM   Modules accepted: Orders

## 2016-11-29 NOTE — Progress Notes (Signed)
Patient ID: Steven [name], male   DOB: [birth year], 36 y.o.   MRN: [id number]   Reason for Appointment:  Hypothyroidism, follow-up visit    History of Present Illness:   HYPOTHYROIDISM  was first diagnosed in ? Early 2013 He previously had hyperthyroidism which was treated with radioactive iodine in 05/2011 He had been taking Synthroid 200 mcg daily initially and it was then reduced to 175 mcg Because of persistently low TSH the dose was again reduced in 08/2014 down to 150 g  He has been a stable dose of levothyroxine since 08/2014 He takes his medication consistently daily before breakfast, usually an hour before eating  Does not complain of any unusual fatigue, shakiness or heat or cold intolerance   Wt Readings from Last 3 Encounters:  11/29/16 295 lb 9.6 oz (134.1 kg)  07/20/16 (!) 303 lb (137.4 kg)  05/24/16 295 lb (133.8 kg)    Labs:  Lab Results  Component Value Date   TSH 2.43 12/22/2015   TSH 0.82 11/12/2014   TSH 0.19 (L) 08/11/2014   FREET4 1.36 12/22/2015   FREET4 1.15 11/12/2014   FREET4 1.32 08/11/2014    OTHER active problems including diabetes are discussed in review of systems:    Past Medical History:  Diagnosis Date  . Asthma   . Cancer ([place])   . Diabetes mellitus without complication ([place])   . HIV positive ([place]) 03/23/09   Genotype Y181C  . HTN (hypertension)   . Kaposi's sarcoma   . Syphilis 03/23/09-   1:2    Past Surgical History:  Procedure Laterality Date  . BRAIN SURGERY    . IR GENERIC HISTORICAL  02/12/2016   IR REMOVAL TUN ACCESS W/ PORT W/O FL MOD SED 02/12/2016 Steven Daft, MD [facility]    Family History  Problem Relation Age of Onset  . Pancreatic cancer Mother   . Cancer Mother   . Diabetes Paternal Grandmother   . Thyroid disease Paternal Grandmother     Social History:  reports that he has never smoked. He has never used smokeless tobacco. He reports that he drinks alcohol. He reports that he does not use  drugs.  Allergies:  Allergies  Allergen Reactions  . Lisinopril Swelling    Swelling of lower lip while on lisinopril  . Penicillins Hives and Swelling    REACTION: rash and swelling  . Aspirin Nausea Only    Allergies as of 11/29/2016      Reactions   Lisinopril Swelling   Swelling of lower lip while on lisinopril   Penicillins Hives, Swelling   REACTION: rash and swelling   Aspirin Nausea Only      Medication List       Accurate as of 11/29/16  4:18 PM. Always use your most recent med list.          acetaminophen 500 MG tablet Commonly known as:  TYLENOL Take 500 mg by mouth every 6 (six) hours as needed. Pain or fever   albuterol 108 (90 Base) MCG/ACT inhaler Commonly known as:  VENTOLIN HFA Inhale two puffs every 4-6 hours if needed for cough or wheeze   amLODipine 10 MG tablet Commonly known as:  NORVASC Take 1 tablet (10 mg total) by mouth daily.   Beclomethasone Diprop HFA 80 MCG/ACT Aerb Commonly known as:  QVAR REDIHALER Inhale 2 puffs into the lungs 2 (two) times daily.   esomeprazole 40 MG capsule Commonly known as:  NEXIUM Take one capsule once daily  as directed   fexofenadine 180 MG tablet Commonly known as:  ALLEGRA Take 1 tablet (180 mg total) by mouth daily.   fluorometholone 0.1 % ophthalmic suspension Commonly known as:  FML Place 1 drop into both eyes 2 (two) times daily.   fluticasone 110 MCG/ACT inhaler Commonly known as:  FLOVENT HFA Inhale 2 puffs into the lungs 2 (two) times daily.   fluticasone 50 MCG/ACT nasal spray Commonly known as:  FLONASE Use two sprays in each nostril daily as directed   fluticasone-salmeterol 230-21 MCG/ACT inhaler Commonly known as:  ADVAIR HFA Inhale two puffs twice daily to prevent cough or wheeze   glucose blood test strip Commonly known as:  TRUETEST TEST Use as instructed for 3 times daily testing of blood glucose   hydrochlorothiazide 25 MG tablet Commonly known as:  HYDRODIURIL Take 1  tablet (25 mg total) by mouth daily.   levothyroxine 150 MCG tablet Commonly known as:  SYNTHROID, LEVOTHROID TAKE 1 TABLET BY MOUTH DAILY. MUST MAKE AN APPT FOR REFILLS.   metFORMIN 500 MG 24 hr tablet Commonly known as:  GLUCOPHAGE-XR TAKE 3 TABLETS(1500 MG) BY MOUTH DAILY WITH SUPPER   montelukast 10 MG tablet Commonly known as:  SINGULAIR TAKE 1 TABLET BY MOUTH ONCE DAILY AS DIRECTED   multivitamin tablet Take 1 tablet by mouth daily.   potassium chloride SA 20 MEQ tablet Commonly known as:  K-DUR,KLOR-CON Take 1 tablet (20 mEq total) by mouth daily.   TRIUMEQ 623-76-283 MG tablet Generic drug:  abacavir-dolutegravir-lamiVUDine TAKE 1 TABLET BY MOUTH EVERY DAY   TRUEPLUS LANCETS 28G Misc USE TO TEST BLOOD SUGAR ONCE A DAY       Review of Systems:  Ophthalmopathy: eyes are mildly irritated and relieved with using artificial tears.    DIABETES: He had been previously treated with glipizide for severe hyperglycemia in 2016 Last year on his visit he was given metformin instead of glipizide which was causing hypoglycemia He is going to establish with a new PCP now He tends to have falsely low A1c and his lab glucose was over 200 recently He thinks his blood sugars are not over 200 after meals and near 100 in the mornings    Lab Results  Component Value Date   HGBA1C 6.6 07/20/2016   HGBA1C 6.8 12/25/2015   HGBA1C 4.90 08/27/2015   Lab Results  Component Value Date   MICROALBUR 4.3 07/20/2016   LDLCALC 74 11/16/2016   CREATININE 1.40 (H) 11/16/2016      CARDIOLOGY:  history of high blood pressure treated by PCP            Examination:    BP (!) 136/98   Pulse 82   Ht '6\' 1"'$  (1.854 m)   Wt 295 lb 9.6 oz (134.1 kg)   SpO2 96%   BMI 39.00 kg/m   He looks well. 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   Emmah Bratcher 11/29/2016, 4:18 PM

## 2016-11-30 ENCOUNTER — Encounter: Payer: Self-pay | Admitting: Infectious Diseases

## 2016-11-30 ENCOUNTER — Ambulatory Visit (INDEPENDENT_AMBULATORY_CARE_PROVIDER_SITE_OTHER): Payer: PPO | Admitting: Infectious Diseases

## 2016-11-30 VITALS — Temp 98.6°F | Ht 73.0 in | Wt 294.0 lb

## 2016-11-30 DIAGNOSIS — N183 Chronic kidney disease, stage 3 unspecified: Secondary | ICD-10-CM

## 2016-11-30 DIAGNOSIS — IMO0002 Reserved for concepts with insufficient information to code with codable children: Secondary | ICD-10-CM

## 2016-11-30 DIAGNOSIS — E1165 Type 2 diabetes mellitus with hyperglycemia: Secondary | ICD-10-CM

## 2016-11-30 DIAGNOSIS — E118 Type 2 diabetes mellitus with unspecified complications: Secondary | ICD-10-CM

## 2016-11-30 DIAGNOSIS — C469 Kaposi's sarcoma, unspecified: Secondary | ICD-10-CM | POA: Diagnosis not present

## 2016-11-30 DIAGNOSIS — I10 Essential (primary) hypertension: Secondary | ICD-10-CM | POA: Diagnosis not present

## 2016-11-30 DIAGNOSIS — B20 Human immunodeficiency virus [HIV] disease: Secondary | ICD-10-CM | POA: Diagnosis not present

## 2016-11-30 LAB — BASIC METABOLIC PANEL
BUN: 13 mg/dL (ref 6–23)
CALCIUM: 9.7 mg/dL (ref 8.4–10.5)
CO2: 27 meq/L (ref 19–32)
Chloride: 100 mEq/L (ref 96–112)
Creatinine, Ser: 1.15 mg/dL (ref 0.40–1.50)
GFR: 91.75 mL/min (ref >60.00)
Glucose, Bld: 117 mg/dL — ABNORMAL HIGH (ref 70–99)
Potassium: 3.3 mEq/L — ABNORMAL LOW (ref 3.5–5.1)
SODIUM: 142 meq/L (ref 135–145)

## 2016-11-30 LAB — TSH: TSH: 4.62 u[IU]/mL — AB (ref 0.35–4.50)

## 2016-11-30 LAB — T4, FREE: FREE T4: 1.25 ng/dL (ref 0.60–1.60)

## 2016-11-30 MED ORDER — ABACAVIR-DOLUTEGRAVIR-LAMIVUD 600-50-300 MG PO TABS: 1.0000 | ORAL_TABLET | Freq: Every day | ORAL | 4 refills | Status: DC

## 2016-11-30 MED ORDER — POTASSIUM CHLORIDE CRYS ER 20 MEQ PO TBCR: 20.0000 meq | EXTENDED_RELEASE_TABLET | Freq: Every day | ORAL | 3 refills | Status: DC

## 2016-11-30 NOTE — Assessment & Plan Note (Signed)
He is doing well Appreciate endo f/u

## 2016-11-30 NOTE — Assessment & Plan Note (Signed)
His last GFR was > 90.  appreciate endo and PCP f/u.

## 2016-11-30 NOTE — Progress Notes (Signed)
Please call to let patient know that the potassium is still low and he needs to add an extra potassium tablet daily, also discussed with PCP Thyroid level is slightly low, he needs to add an extra half tablet once a week of the Synthroid and follow-up in 4 months instead of 1 year

## 2016-11-30 NOTE — Assessment & Plan Note (Signed)
Doing well.  No problems with meds.  Offered/refused condoms.  vax are up to date.  Will see him back in 6 months.

## 2016-11-30 NOTE — Assessment & Plan Note (Signed)
BP has been well controlled.

## 2016-11-30 NOTE — Progress Notes (Signed)
   Subjective:    Patient ID: Steven [name], male    DOB: 1980-07-14, 36 y.o.   MRN: [id number]  HPI 36 yo M with hx of AIDS, KS.He is being monitored for his KS by Onc.  He was previously on CTX however in 2011 he had an intracranial bleed.   He was changed from TRV/ISN in 2014 to triumeq. He had f/u with Onc on 05-23-16, was felt to be doing well.  He also has been seen by endo for hypothyroidism and DM2.   AC FSG 140-150, AM 70-150.   HIV 1 RNA Quant (copies/mL)  Date Value  11/16/2016 <20 NOT DETECTED  04/05/2016 <20  03/10/2015 <20   CD4 T Cell Abs (/uL)  Date Value  11/16/2016 510  04/05/2016 520  03/11/2015 410    Review of Systems  Constitutional: Negative for appetite change and unexpected weight change.  Eyes: Negative for visual disturbance.  Respiratory: Negative for cough and shortness of breath.   Gastrointestinal: Negative for constipation and diarrhea.  Genitourinary: Negative for difficulty urinating.  Neurological: Negative for numbness.   Working on wt loss. Has lost 23#. Exercising at [place] occas for allergies. Gets xolair q2weeks. Elevates legs at home.     Objective:   Physical Exam  Constitutional: He appears well-developed and well-nourished.  HENT:  Mouth/Throat: No oropharyngeal exudate.  Eyes: EOM are normal. Pupils are equal, round, and reactive to light.  Neck: Neck supple.  Cardiovascular: Normal rate, regular rhythm and normal heart sounds.   Pulmonary/Chest: Effort normal and breath sounds normal.  Abdominal: Soft. Bowel sounds are normal. There is no tenderness. There is no rebound.  Musculoskeletal: He exhibits edema.  Lymphadenopathy:    He has no cervical adenopathy.  Neurological:  Grossly nl light touch BLE.  Psychiatric: He has a normal mood and affect.          Assessment & Plan:

## 2016-11-30 NOTE — Assessment & Plan Note (Signed)
Has h/o f/u in July.  Has been doing well, no new lesions.

## 2016-12-01 ENCOUNTER — Telehealth: Payer: Self-pay | Admitting: Endocrinology

## 2016-12-01 ENCOUNTER — Ambulatory Visit: Payer: PPO

## 2016-12-01 NOTE — Telephone Encounter (Signed)
Patient attempting to return phone call. Confirmed best call back number is primary listed. Asked to be called after 3 PM.

## 2016-12-01 NOTE — Telephone Encounter (Signed)
Called patient back to go over lab results. Left a voice message.

## 2016-12-02 NOTE — Telephone Encounter (Signed)
Called patient again and left a message. Will try to call him again on Tuesday if not able to speak with him.

## 2016-12-06 NOTE — Telephone Encounter (Signed)
Patient called back. He is availablke today [phone]. Please return call to cell

## 2016-12-06 NOTE — Telephone Encounter (Signed)
Called patient and left a voice message to go over his lab results.

## 2016-12-07 ENCOUNTER — Encounter: Payer: Self-pay | Admitting: Podiatry

## 2016-12-07 ENCOUNTER — Telehealth: Payer: Self-pay | Admitting: Endocrinology

## 2016-12-07 ENCOUNTER — Ambulatory Visit (INDEPENDENT_AMBULATORY_CARE_PROVIDER_SITE_OTHER): Payer: PPO | Admitting: Podiatry

## 2016-12-07 DIAGNOSIS — M79676 Pain in unspecified toe(s): Secondary | ICD-10-CM

## 2016-12-07 DIAGNOSIS — B351 Tinea unguium: Secondary | ICD-10-CM | POA: Diagnosis not present

## 2016-12-07 NOTE — Progress Notes (Signed)
Complaint:  Visit Type: Patient returns to my office for continued preventative foot care services. Complaint: Patient states" my nails have grown long and thick and become painful to walk and wear shoes"  The patient presents for preventative foot care services. No changes to ROS  Podiatric Exam: Vascular: dorsalis pedis and posterior tibial pulses are palpable bilateral. Capillary return is immediate. Temperature gradient is WNL. Skin turgor WNL  Sensorium: Normal Semmes Weinstein monofilament test. Normal tactile sensation bilaterally. Nail Exam: Pt has thick disfigured discolored nails with subungual debris noted bilateral entire nail hallux through fifth toenails Ulcer Exam: There is no evidence of ulcer or pre-ulcerative changes or infection. Orthopedic Exam: Muscle tone and strength are WNL. No limitations in general ROM. No crepitus or effusions noted. Foot type and digits show no abnormalities. Bony prominences are unremarkable. Skin: No Porokeratosis. No infection or ulcers  Diagnosis:  Onychomycosis, , Pain in right toe, pain in left toes  Treatment & Plan Procedures and Treatment: Consent by patient was obtained for treatment procedures. The patient understood the discussion of treatment and procedures well. All questions were answered thoroughly reviewed. Debridement of mycotic and hypertrophic toenails, 1 through 5 bilateral and clearing of subungual debris. No ulceration, no infection noted.  Return Visit-Office Procedure: Patient instructed to return to the office for a follow up visit 3 months for continued evaluation and treatment.    Gardiner Barefoot DPM

## 2016-12-07 NOTE — Telephone Encounter (Signed)
Patient asked for return call anytime after 3 PM today.

## 2016-12-09 ENCOUNTER — Ambulatory Visit: Payer: PPO | Admitting: Internal Medicine

## 2016-12-09 ENCOUNTER — Other Ambulatory Visit: Payer: Self-pay | Admitting: Endocrinology

## 2016-12-09 DIAGNOSIS — Z0289 Encounter for other administrative examinations: Secondary | ICD-10-CM

## 2016-12-09 NOTE — Telephone Encounter (Signed)
I have tried to call this patient almost every day and not able to reach him. Left another voice message today.

## 2016-12-09 NOTE — Telephone Encounter (Signed)
Patient returning phone call, transferred call to [facility] directly with her approval to speak to patient.

## 2016-12-21 DIAGNOSIS — T50905A Adverse effect of unspecified drugs, medicaments and biological substances, initial encounter: Secondary | ICD-10-CM | POA: Diagnosis not present

## 2017-01-03 ENCOUNTER — Other Ambulatory Visit: Payer: Self-pay | Admitting: Endocrinology

## 2017-01-23 ENCOUNTER — Ambulatory Visit: Payer: Medicare Other | Admitting: Nurse Practitioner

## 2017-01-25 ENCOUNTER — Ambulatory Visit (INDEPENDENT_AMBULATORY_CARE_PROVIDER_SITE_OTHER): Payer: PPO

## 2017-01-25 ENCOUNTER — Ambulatory Visit: Payer: PPO

## 2017-01-25 DIAGNOSIS — J4540 Moderate persistent asthma, uncomplicated: Secondary | ICD-10-CM

## 2017-01-31 ENCOUNTER — Encounter: Payer: Self-pay | Admitting: Infectious Diseases

## 2017-02-06 ENCOUNTER — Other Ambulatory Visit: Payer: Self-pay | Admitting: Allergy and Immunology

## 2017-02-09 ENCOUNTER — Ambulatory Visit (INDEPENDENT_AMBULATORY_CARE_PROVIDER_SITE_OTHER): Payer: PPO | Admitting: *Deleted

## 2017-02-09 DIAGNOSIS — J4540 Moderate persistent asthma, uncomplicated: Secondary | ICD-10-CM | POA: Diagnosis not present

## 2017-02-16 ENCOUNTER — Other Ambulatory Visit: Payer: Self-pay

## 2017-02-16 ENCOUNTER — Telehealth: Payer: Self-pay | Admitting: Endocrinology

## 2017-02-16 MED ORDER — LEVOTHYROXINE SODIUM 150 MCG PO TABS: ORAL_TABLET | ORAL | 0 refills | Status: DC

## 2017-02-16 NOTE — Telephone Encounter (Signed)
Called patient and let him know that I have sent in his prescription for 90 day supply and to please check before he goes to pick it up.

## 2017-02-16 NOTE — Telephone Encounter (Signed)
Routing to you °

## 2017-02-16 NOTE — Telephone Encounter (Signed)
MEDICATION: levothyroxine (SYNTHROID, LEVOTHROID) 150 MCG tablet  PHARMACY:  [facility] - [place], [place] - [place] [phone] (Phone) [phone] (Fax)       IS THIS A 90 DAY SUPPLY : Y  IS PATIENT OUT OF MEDICTAION: Y  IF NOT; HOW MUCH IS LEFT:   LAST APPOINTMENT DATE: / 11/29/16  NEXT APPOINTMENT DATE:/ 04/14/17  OTHER COMMENTS:    **Let patient know to contact pharmacy at the end of the day to make sure medication is ready. **  ** Please notify patient to allow 48-72 hours to process**  **Encourage patient to contact the pharmacy for refills or they can request refills through [facility]**

## 2017-02-23 ENCOUNTER — Ambulatory Visit (INDEPENDENT_AMBULATORY_CARE_PROVIDER_SITE_OTHER): Payer: PPO | Admitting: *Deleted

## 2017-02-23 DIAGNOSIS — J4540 Moderate persistent asthma, uncomplicated: Secondary | ICD-10-CM

## 2017-03-04 ENCOUNTER — Other Ambulatory Visit: Payer: Self-pay | Admitting: Allergy and Immunology

## 2017-03-09 ENCOUNTER — Ambulatory Visit (INDEPENDENT_AMBULATORY_CARE_PROVIDER_SITE_OTHER): Payer: PPO | Admitting: *Deleted

## 2017-03-09 DIAGNOSIS — J4540 Moderate persistent asthma, uncomplicated: Secondary | ICD-10-CM | POA: Diagnosis not present

## 2017-03-14 ENCOUNTER — Ambulatory Visit: Payer: PPO | Admitting: Podiatry

## 2017-03-23 ENCOUNTER — Ambulatory Visit (INDEPENDENT_AMBULATORY_CARE_PROVIDER_SITE_OTHER): Payer: PPO | Admitting: *Deleted

## 2017-03-23 DIAGNOSIS — J4540 Moderate persistent asthma, uncomplicated: Secondary | ICD-10-CM

## 2017-03-28 ENCOUNTER — Ambulatory Visit (INDEPENDENT_AMBULATORY_CARE_PROVIDER_SITE_OTHER): Payer: PPO | Admitting: Primary Care

## 2017-03-28 ENCOUNTER — Encounter: Payer: Self-pay | Admitting: Primary Care

## 2017-03-28 VITALS — BP 120/78 | HR 89 | Temp 98.5°F | Ht 73.0 in | Wt 293.1 lb

## 2017-03-28 DIAGNOSIS — IMO0002 Reserved for concepts with insufficient information to code with codable children: Secondary | ICD-10-CM

## 2017-03-28 DIAGNOSIS — B20 Human immunodeficiency virus [HIV] disease: Secondary | ICD-10-CM | POA: Diagnosis not present

## 2017-03-28 DIAGNOSIS — E890 Postprocedural hypothyroidism: Secondary | ICD-10-CM | POA: Diagnosis not present

## 2017-03-28 DIAGNOSIS — I10 Essential (primary) hypertension: Secondary | ICD-10-CM | POA: Diagnosis not present

## 2017-03-28 DIAGNOSIS — E118 Type 2 diabetes mellitus with unspecified complications: Secondary | ICD-10-CM | POA: Diagnosis not present

## 2017-03-28 DIAGNOSIS — N183 Chronic kidney disease, stage 3 unspecified: Secondary | ICD-10-CM

## 2017-03-28 DIAGNOSIS — J45909 Unspecified asthma, uncomplicated: Secondary | ICD-10-CM | POA: Insufficient documentation

## 2017-03-28 DIAGNOSIS — J4540 Moderate persistent asthma, uncomplicated: Secondary | ICD-10-CM

## 2017-03-28 DIAGNOSIS — E119 Type 2 diabetes mellitus without complications: Secondary | ICD-10-CM

## 2017-03-28 DIAGNOSIS — K219 Gastro-esophageal reflux disease without esophagitis: Secondary | ICD-10-CM

## 2017-03-28 DIAGNOSIS — E1165 Type 2 diabetes mellitus with hyperglycemia: Secondary | ICD-10-CM

## 2017-03-28 LAB — HEMOGLOBIN A1C: Hgb A1c MFr Bld: 6.2 % (ref 4.6–6.5)

## 2017-03-28 MED ORDER — AMLODIPINE BESYLATE 10 MG PO TABS: 10.0000 mg | ORAL_TABLET | Freq: Every day | ORAL | 3 refills | Status: DC

## 2017-03-28 MED ORDER — HYDROCHLOROTHIAZIDE 25 MG PO TABS: 25.0000 mg | ORAL_TABLET | Freq: Every day | ORAL | 3 refills | Status: DC

## 2017-03-28 NOTE — Assessment & Plan Note (Signed)
Stable on Nexium 40 mg. Does experience return of symptoms within 24 hours without medication. Continue same.

## 2017-03-28 NOTE — Patient Instructions (Signed)
Complete lab work prior to leaving today. I will notify you of your results once received.   Please schedule a physical with me in 3 months. You may also schedule a lab only appointment 3-4 days prior. We will discuss your lab results in detail during your physical.  It was a pleasure to meet you today! Please don't hesitate to call me with any questions. Welcome to [facility]!

## 2017-03-28 NOTE — Progress Notes (Signed)
Subjective:    Patient ID: Steven [name], male    DOB: 11/11/1980, 36 y.o.   MRN: [id number]  HPI  Steven [name] is a 36 year old male who presents today to establish care and discuss the problems mentioned below. Will obtain old records. He is due for his annual exam and plans on scheduling.   1) Type 2 Diabetes: Diagnosed 2 years ago. Currently managed on Metformin XR 1500 mg once daily. Was previously managed on Glipizide and would like to resume given symptoms of GI upset including episodes of diarrhea three times weekly with metformin. His last A1C was 6.6 in January 2018. He's been working on diet since diagnosis of HIV.   2) Essential Hypertension: Currently managed on Amlodipine 10 mg, HCTZ 25 mg, and potassium 20 mEq tablets. His BP in the office today is 120/78. History of hypokalemia secondary to hypothyroidism and CKD. His endocrinologist is monitoring his potassium level and has made adjustments.   3) Hypothyroidism: History of Graves Disease, under went iodine treatment. Currently managed on levothyroxine 150 mcg. Currently following with endocrinology.   4) HIV: Diagnosed in 2010. Currently managed on Triumeq. Currently following with Dr. Johnnye Sima for whom he sees every 6 months. Diagnosed and treated for Kaposi's Sarcoma in 2010. He endorses that his CD4 levels have been undetectable for a while.  5) GERD: Currently managed on Nexium 40 mg. He will experience symptoms of esophageal and throat burning without his medication.   6) CKD Stage III: History of CKD diagnosed years ago. Was removed from metformin during this time. BMP in May 2018 with creatinine of 1.1 and GFR of 91. Currently prescribed oral potassium.   7) Asthma: Diagnosed in middle school years. Currently managed on Advair HFA 230-21 mcg BID and albuterol HFA PRN. Also managed on Allegra and Singulair. He is currently following with Asthma and Allergy.   Review of Systems  Constitutional: Negative for unexpected  weight change.  HENT: Positive for postnasal drip.   Eyes: Negative for visual disturbance.  Respiratory: Negative for shortness of breath.        Intermittent wheezing during seasonal changes  Cardiovascular: Negative for chest pain.  Gastrointestinal:       Gerdc  Skin: Negative for color change.  Allergic/Immunologic: Positive for environmental allergies.  Neurological: Negative for headaches.  Hematological: Negative for adenopathy.       Past Medical History:  Diagnosis Date  . Asthma   . Cancer ([place])   . Diabetes mellitus without complication ([place])   . HIV positive ([place]) 03/23/09   Genotype Y181C  . HTN (hypertension)   . Kaposi's sarcoma   . Syphilis 03/23/09-   1:2     Social History   Social History  . Marital status: Single    Spouse name: N/A  . Number of children: N/A  . Years of education: N/A   Occupational History  . Not on file.   Social History Main Topics  . Smoking status: Never Smoker  . Smokeless tobacco: Never Used  . Alcohol use 0.0 oz/week     Comment: socially - less now  . Drug use: No  . Sexual activity: Not Currently    Partners: Male    Birth control/ protection: Condom     Comment: pt. declined condoms   Other Topics Concern  . Not on file   Social History Narrative   Single.    No children.   Works in Therapist, art   Enjoys DJ, traveling.  Past Surgical History:  Procedure Laterality Date  . BRAIN SURGERY    . IR GENERIC HISTORICAL  02/12/2016   IR REMOVAL TUN ACCESS W/ PORT W/O FL MOD SED 02/12/2016 [name], MD [facility]    Family History  Problem Relation Age of Onset  . Pancreatic cancer Mother   . Cancer Mother   . Diabetes Paternal Grandmother   . Thyroid disease Paternal Grandmother     Allergies  Allergen Reactions  . Lisinopril Swelling    Swelling of lower lip while on lisinopril  . Penicillins Hives and Swelling    REACTION: rash and swelling  . Aspirin Nausea Only    Current Outpatient  Prescriptions on File Prior to Visit  Medication Sig Dispense Refill  . abacavir-dolutegravir-lamiVUDine (TRIUMEQ) 600-50-300 MG tablet Take 1 tablet by mouth daily. 90 tablet 4  . albuterol (VENTOLIN HFA) 108 (90 Base) MCG/ACT inhaler Inhale two puffs every 4-6 hours if needed for cough or wheeze 18 g 1  . esomeprazole (NEXIUM) 40 MG capsule TAKE ONE CAPSULE BY MOUTH ONCE DAILY AS DIRECTED 30 capsule 0  . fexofenadine (ALLEGRA) 180 MG tablet Take 1 tablet (180 mg total) by mouth daily. 15 tablet 0  . fluticasone (FLONASE) 50 MCG/ACT nasal spray Use two sprays in each nostril daily as directed 16 g 0  . fluticasone (FLOVENT HFA) 110 MCG/ACT inhaler Inhale 2 puffs into the lungs 2 (two) times daily. 1 Inhaler 5  . fluticasone-salmeterol (ADVAIR HFA) 230-21 MCG/ACT inhaler Inhale two puffs twice daily to prevent cough or wheeze 12 g 5  . glucose blood (TRUETEST TEST) test strip Use as instructed for 3 times daily testing of blood glucose 100 each 12  . levothyroxine (SYNTHROID, LEVOTHROID) 150 MCG tablet Take 1 tablet by mouth daily. 90 tablet 0  . metFORMIN (GLUCOPHAGE-XR) 500 MG 24 hr tablet TAKE 3 TABLETS(1500 MG) BY MOUTH DAILY WITH SUPPER 270 tablet 3  . montelukast (SINGULAIR) 10 MG tablet TAKE 1 TABLET BY MOUTH ONCE DAILY AS DIRECTED 30 tablet 3  . Multiple Vitamin (MULTIVITAMIN) tablet Take 1 tablet by mouth daily.    . potassium chloride SA (K-DUR,KLOR-CON) 20 MEQ tablet Take 1 tablet (20 mEq total) by mouth daily. 90 tablet 3  . TRUEPLUS LANCETS 28G MISC USE TO TEST BLOOD SUGAR ONCE A DAY 100 each 5  . [DISCONTINUED] carvedilol (COREG) 12.5 MG tablet Take 1 tablet (12.5 mg total) by mouth 2 (two) times daily with a meal. 30 tablet 0  . [DISCONTINUED] lisinopril-hydrochlorothiazide (PRINZIDE,ZESTORETIC) 20-12.5 MG per tablet Take 1 tablet by mouth daily.     Current Facility-Administered Medications on File Prior to Visit  Medication Dose Route Frequency Provider Last Rate Last Dose  .  omalizumab Arvid Right) injection 300 mg  300 mg Subcutaneous Q14 Days Kozlow, Donnamarie Poag, MD   300 mg at 03/23/17 1731    BP 120/78   Pulse 89   Temp 98.5 F (36.9 C) (Oral)   Ht 6\' 1"  (1.854 m)   Wt 293 lb 1.9 oz (133 kg)   SpO2 95%   BMI 38.67 kg/m    Objective:   Physical Exam  Constitutional: He is oriented to person, place, and time. He appears well-nourished.  Neck: Neck supple.  Cardiovascular: Normal rate and regular rhythm.   Pulmonary/Chest: Effort normal. He has wheezes in the right upper field and the left upper field. He has no rales.  Musculoskeletal: Normal range of motion.  Neurological: He is alert and oriented to person,  place, and time.  Skin: Skin is warm and dry.  Psychiatric: He has a normal mood and affect.          Assessment & Plan:

## 2017-03-28 NOTE — Assessment & Plan Note (Signed)
Following with allergy and asthma clinic. Overall stable on Advair, albuterol, Allegra, Singulair. Mild wheezing on exam, no distress.

## 2017-03-28 NOTE — Assessment & Plan Note (Signed)
Recent creatinine of 1.1, GFR 90. Continue to monitor and avoid nephrotoxic agents. Check urine microalbumin during next visit, may require low-dose ace/arb.

## 2017-03-28 NOTE — Assessment & Plan Note (Signed)
Stable in the office today, continue amlodipine, HCTZ. Refill sent to pharmacy.

## 2017-03-28 NOTE — Assessment & Plan Note (Signed)
Following with endocrinology. Due for repeat TSH and potassium recheck in October.

## 2017-03-28 NOTE — Assessment & Plan Note (Signed)
Endorses undetectable CD4 counts/viral load. Following with infectious disease, compliant to treatment.

## 2017-03-28 NOTE — Assessment & Plan Note (Signed)
A1c from January 2018 stable, repeat A1c due and pending today. Does have significant history of GI upset with metformin, will reduce dose to 500 mg once daily and add in glipizide to supplement. Will await A1c result for glipizide dosing. Recent creatinine unremarkable.

## 2017-04-06 ENCOUNTER — Ambulatory Visit: Payer: PPO

## 2017-04-12 ENCOUNTER — Other Ambulatory Visit: Payer: PPO

## 2017-04-14 ENCOUNTER — Ambulatory Visit: Payer: PPO | Admitting: Endocrinology

## 2017-05-10 ENCOUNTER — Ambulatory Visit (INDEPENDENT_AMBULATORY_CARE_PROVIDER_SITE_OTHER): Payer: PPO

## 2017-05-10 DIAGNOSIS — Z23 Encounter for immunization: Secondary | ICD-10-CM

## 2017-05-22 ENCOUNTER — Ambulatory Visit: Payer: PPO | Admitting: Endocrinology

## 2017-05-22 ENCOUNTER — Other Ambulatory Visit: Payer: Self-pay | Admitting: Internal Medicine

## 2017-05-22 ENCOUNTER — Telehealth: Payer: Self-pay | Admitting: Endocrinology

## 2017-05-22 ENCOUNTER — Other Ambulatory Visit: Payer: Self-pay | Admitting: Allergy and Immunology

## 2017-05-22 DIAGNOSIS — I10 Essential (primary) hypertension: Secondary | ICD-10-CM

## 2017-05-23 NOTE — Telephone Encounter (Signed)
Please advise if okay to fill both medications. I do not see that you are filling his Metformin. Please advise.

## 2017-05-23 NOTE — Telephone Encounter (Signed)
We haven't seen him for diabetes and not prescribing metformin

## 2017-05-23 NOTE — Telephone Encounter (Signed)
I have approved the Levothyroxine but I did not approve the Metformin per Dr. Jodelle Green note.

## 2017-05-24 ENCOUNTER — Other Ambulatory Visit: Payer: PPO

## 2017-05-24 ENCOUNTER — Ambulatory Visit: Payer: PPO | Admitting: Podiatry

## 2017-05-24 ENCOUNTER — Ambulatory Visit: Payer: PPO | Admitting: Endocrinology

## 2017-05-24 DIAGNOSIS — B20 Human immunodeficiency virus [HIV] disease: Secondary | ICD-10-CM

## 2017-05-24 LAB — COMPREHENSIVE METABOLIC PANEL
AG RATIO: 1.5 (calc) (ref 1.0–2.5)
ALKALINE PHOSPHATASE (APISO): 54 U/L (ref 40–115)
ALT: 30 U/L (ref 9–46)
AST: 19 U/L (ref 10–40)
Albumin: 4.4 g/dL (ref 3.6–5.1)
BUN/Creatinine Ratio: 9 (calc) (ref 6–22)
BUN: 14 mg/dL (ref 7–25)
CHLORIDE: 97 mmol/L — AB (ref 98–110)
CO2: 32 mmol/L (ref 20–32)
Calcium: 9.3 mg/dL (ref 8.6–10.3)
Creat: 1.63 mg/dL — ABNORMAL HIGH (ref 0.60–1.35)
GLOBULIN: 2.9 g/dL (ref 1.9–3.7)
GLUCOSE: 173 mg/dL — AB (ref 65–99)
POTASSIUM: 3.4 mmol/L — AB (ref 3.5–5.3)
Sodium: 141 mmol/L (ref 135–146)
Total Bilirubin: 0.8 mg/dL (ref 0.2–1.2)
Total Protein: 7.3 g/dL (ref 6.1–8.1)

## 2017-05-24 LAB — CBC
HCT: 47.5 % (ref 38.5–50.0)
Hemoglobin: 15.6 g/dL (ref 13.2–17.1)
MCH: 25.5 pg — ABNORMAL LOW (ref 27.0–33.0)
MCHC: 32.8 g/dL (ref 32.0–36.0)
MCV: 77.7 fL — AB (ref 80.0–100.0)
MPV: 13 fL — ABNORMAL HIGH (ref 7.5–12.5)
PLATELETS: 226 10*3/uL (ref 140–400)
RBC: 6.11 10*6/uL — ABNORMAL HIGH (ref 4.20–5.80)
RDW: 13.4 % (ref 11.0–15.0)
WBC: 6.5 10*3/uL (ref 3.8–10.8)

## 2017-05-26 LAB — T-HELPER CELL (CD4) - (RCID CLINIC ONLY)
CD4 % Helper T Cell: 19 % — ABNORMAL LOW (ref 33–55)
CD4 T CELL ABS: 560 /uL (ref 400–2700)

## 2017-05-26 LAB — HIV-1 RNA QUANT-NO REFLEX-BLD
HIV 1 RNA QUANT: 21 {copies}/mL — AB
HIV-1 RNA Quant, Log: 1.32 Log copies/mL — ABNORMAL HIGH

## 2017-05-29 ENCOUNTER — Encounter (HOSPITAL_COMMUNITY): Payer: Self-pay | Admitting: Emergency Medicine

## 2017-05-29 ENCOUNTER — Emergency Department (HOSPITAL_COMMUNITY)
Admission: EM | Admit: 2017-05-29 | Discharge: 2017-05-30 | Disposition: A | Discharge status: Home / Self Care | Payer: PPO | Attending: Emergency Medicine | Admitting: Emergency Medicine

## 2017-05-29 ENCOUNTER — Other Ambulatory Visit: Payer: Self-pay | Admitting: Allergy and Immunology

## 2017-05-29 DIAGNOSIS — E039 Hypothyroidism, unspecified: Secondary | ICD-10-CM | POA: Insufficient documentation

## 2017-05-29 DIAGNOSIS — B9689 Other specified bacterial agents as the cause of diseases classified elsewhere: Secondary | ICD-10-CM | POA: Diagnosis not present

## 2017-05-29 DIAGNOSIS — T7840XA Allergy, unspecified, initial encounter: Secondary | ICD-10-CM | POA: Diagnosis not present

## 2017-05-29 DIAGNOSIS — J019 Acute sinusitis, unspecified: Secondary | ICD-10-CM | POA: Diagnosis not present

## 2017-05-29 DIAGNOSIS — T782XXA Anaphylactic shock, unspecified, initial encounter: Secondary | ICD-10-CM | POA: Insufficient documentation

## 2017-05-29 DIAGNOSIS — E1122 Type 2 diabetes mellitus with diabetic chronic kidney disease: Secondary | ICD-10-CM | POA: Insufficient documentation

## 2017-05-29 DIAGNOSIS — Z79899 Other long term (current) drug therapy: Secondary | ICD-10-CM | POA: Diagnosis not present

## 2017-05-29 DIAGNOSIS — N183 Chronic kidney disease, stage 3 (moderate): Secondary | ICD-10-CM | POA: Insufficient documentation

## 2017-05-29 DIAGNOSIS — Z7984 Long term (current) use of oral hypoglycemic drugs: Secondary | ICD-10-CM | POA: Insufficient documentation

## 2017-05-29 DIAGNOSIS — L299 Pruritus, unspecified: Secondary | ICD-10-CM | POA: Diagnosis present

## 2017-05-29 DIAGNOSIS — I129 Hypertensive chronic kidney disease with stage 1 through stage 4 chronic kidney disease, or unspecified chronic kidney disease: Secondary | ICD-10-CM | POA: Diagnosis not present

## 2017-05-29 DIAGNOSIS — J45909 Unspecified asthma, uncomplicated: Secondary | ICD-10-CM | POA: Diagnosis not present

## 2017-05-29 DIAGNOSIS — J209 Acute bronchitis, unspecified: Secondary | ICD-10-CM | POA: Diagnosis not present

## 2017-05-29 MED ORDER — METHYLPREDNISOLONE SODIUM SUCC 125 MG IJ SOLR
125.0000 mg | Freq: Once | INTRAMUSCULAR | Status: AC
  Administered 2017-05-29: 125 mg via INTRAVENOUS
  Filled 2017-05-29: qty 2

## 2017-05-29 MED ORDER — DIPHENHYDRAMINE HCL 50 MG/ML IJ SOLN
25.0000 mg | Freq: Once | INTRAMUSCULAR | Status: AC
  Administered 2017-05-30: 25 mg via INTRAVENOUS
  Filled 2017-05-29: qty 1

## 2017-05-29 MED ORDER — FAMOTIDINE IN NACL 20-0.9 MG/50ML-% IV SOLN
20.0000 mg | Freq: Once | INTRAVENOUS | Status: AC
  Administered 2017-05-30: 20 mg via INTRAVENOUS
  Filled 2017-05-29: qty 50

## 2017-05-29 MED ORDER — EPINEPHRINE 0.3 MG/0.3ML IJ SOAJ
0.3000 mg | Freq: Once | INTRAMUSCULAR | Status: AC
  Administered 2017-05-29: 0.3 mg via INTRAMUSCULAR
  Filled 2017-05-29: qty 0.3

## 2017-05-29 MED ORDER — ALBUTEROL SULFATE (2.5 MG/3ML) 0.083% IN NEBU
5.0000 mg | INHALATION_SOLUTION | Freq: Once | RESPIRATORY_TRACT | Status: AC
  Administered 2017-05-29: 5 mg via RESPIRATORY_TRACT
  Filled 2017-05-29: qty 6

## 2017-05-29 NOTE — ED Provider Notes (Signed)
[facility] EMERGENCY DEPARTMENT Provider Note   CSN: [id number] Arrival date & time: 05/29/17  2221     History   Chief Complaint Chief Complaint  Patient presents with  . Allergic Reaction    HPI Steven [name] is a 36 y.o. male.  The history is provided by the patient.  Allergic Reaction  Presenting symptoms: difficulty breathing, itching, rash, swelling and wheezing   Presenting symptoms: no difficulty swallowing   Severity:  Moderate Duration:  2 hours Context: medications   Relieved by:  Nothing Worsened by:  Nothing pt reports abrupt onset of allergic reaction about 2 hrs ago He reports he took medications for cough (tessalon/prednisone/cough medicine/levaquin) About 2 hrs later rash/difficulty breathing and facial swelling began meds he has not had before include tessalon and levaquin  Denies any GI symptoms Course is worsening  Past Medical History:  Diagnosis Date  . Asthma   . Cancer ([place])   . Diabetes mellitus without complication ([place])   . HIV positive ([place]) 03/23/09   Genotype Y181C  . HTN (hypertension)   . Kaposi's sarcoma   . Syphilis 03/23/09-   1:2    Patient Active Problem List   Diagnosis Date Noted  . GERD (gastroesophageal reflux disease) 03/28/2017  . Asthma 03/28/2017  . Diabetes mellitus type 2 with complications, uncontrolled ([place]) 03/25/2015  . CKD (chronic kidney disease) stage 3, GFR 30-59 ml/min ([facility]) 03/23/2015  . Oral thrush 03/23/2015  . Essential hypertension 11/24/2014  . Post-nasal drip 02/05/2014  . 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 Arthralgia of elbow, right 07/29/2013  . Colitis 05/27/2011  . KS (Kaposi's sarcoma) ([place]) 04/11/2011  . 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 Human immunodeficiency virus (HIV) disease ([place]) 04/10/2009  . Lymphedema 04/10/2009  . SYPHILIS 04/06/2009    Past Surgical History:  Procedure Laterality Date  . BRAIN  SURGERY    . IR GENERIC HISTORICAL  02/12/2016   IR REMOVAL TUN ACCESS W/ PORT W/O FL MOD SED 02/12/2016 [name], MD [facility]       Home Medications    Prior to Admission medications   Medication Sig Start Date End Date Taking? Authorizing Provider  abacavir-dolutegravir-lamiVUDine (TRIUMEQ) 600-50-300 MG tablet Take 1 tablet by mouth daily. 11/30/16   Campbell Riches, MD  albuterol (VENTOLIN HFA) 108 (90 Base) MCG/ACT inhaler Inhale two puffs every 4-6 hours if needed for cough or wheeze 02/12/16   Kozlow, Donnamarie Poag, MD  amLODipine (NORVASC) 10 MG tablet Take 1 tablet (10 mg total) by mouth daily. 03/28/17   Pleas [name], NP  esomeprazole (NEXIUM) 40 MG capsule TAKE ONE CAPSULE BY MOUTH ONCE DAILY AS DIRECTED 02/06/17   Kozlow, Donnamarie Poag, MD  fexofenadine (ALLEGRA) 180 MG tablet Take 1 tablet (180 mg total) by mouth daily. 05/03/12   Harden Mo, MD  fluticasone Asencion Islam) 50 MCG/ACT nasal spray Use two sprays in each nostril daily as directed 08/10/16   Kozlow, Donnamarie Poag, MD  fluticasone (FLOVENT HFA) 110 MCG/ACT inhaler Inhale 2 puffs into the lungs 2 (two) times daily. 09/23/16   Kozlow, Donnamarie Poag, MD  fluticasone-salmeterol (ADVAIR HFA) [phone] MCG/ACT inhaler Inhale two puffs twice daily to prevent cough or wheeze 02/12/16   Kozlow, Donnamarie Poag, MD  glucose blood (TRUETEST TEST) test strip Use as instructed for 3 times daily testing of blood glucose 08/12/16   Jegede, Olugbemiga E, MD  hydrochlorothiazide (HYDRODIURIL) 25 MG tablet Take 1 tablet (25 mg total) by  mouth daily. 03/28/17   Pleas [name], NP  levothyroxine (SYNTHROID, LEVOTHROID) 150 MCG tablet TAKE 1 TABLET BY MOUTH DAILY 05/23/17   Elayne Snare, MD  metFORMIN (GLUCOPHAGE-XR) 500 MG 24 hr tablet TAKE 3 TABLETS(1500 MG) BY MOUTH DAILY WITH SUPPER 07/20/16   Jegede, Olugbemiga E, MD  montelukast (SINGULAIR) 10 MG tablet TAKE 1 TABLET BY MOUTH ONCE DAILY AS DIRECTED 09/22/16   Kozlow, Donnamarie Poag, MD  Multiple Vitamin (MULTIVITAMIN) tablet  Take 1 tablet by mouth daily.    [provider]  potassium chloride SA (K-DUR,KLOR-CON) 20 MEQ tablet Take 1 tablet (20 mEq total) by mouth daily. 11/30/16   Campbell Riches, MD  TRUEPLUS LANCETS 28G MISC USE TO TEST BLOOD SUGAR ONCE A DAY 07/12/16   Tresa Garter, MD    Family History Family History  Problem Relation Age of Onset  . Pancreatic cancer Mother   . Cancer Mother   . Diabetes Paternal Grandmother   . Thyroid disease Paternal Grandmother     Social History Social History   Tobacco Use  . Smoking status: Never Smoker  . Smokeless tobacco: Never Used  Substance Use Topics  . Alcohol use: Yes    Alcohol/week: 0.0 oz    Comment: socially - less now  . Drug use: No     Allergies   Lisinopril; Penicillins; Levaquin [levofloxacin in d5w]; Tessalon [benzonatate]; and Aspirin   Review of Systems Review of Systems  HENT: Negative for trouble swallowing.   Respiratory: Positive for wheezing.   Gastrointestinal: Negative for diarrhea and vomiting.  Skin: Positive for itching and rash.  All other systems reviewed and are negative.    Physical Exam Updated Vital Signs BP (!) 142/85 (BP Location: Left Arm)   Pulse (!) 102   Temp 99.4 F (37.4 C) (Oral)   Resp 16   Ht 1.854 m (6\' 1" )   Wt 132.9 kg (293 lb)   SpO2 94%   BMI 38.66 kg/m   Physical Exam CONSTITUTIONAL: Well developed/well nourished HEAD: Normocephalic/atraumatic EYES: EOMI/PERRL ENMT: Mucous membranes moist, mild lip swelling, uvula midline, no tongue swelling, no uvular edema noted NECK: supple no meningeal signs SPINE/BACK:entire spine nontender CV: S1/S2 noted, no murmurs/rubs/gallops noted LUNGS: wheezing bilaterally ABDOMEN: soft, nontender, no rebound or guarding, bowel sounds noted throughout abdomen GU:no cva tenderness NEURO: Pt is awake/alert/appropriate, moves all extremitiesx4.  No facial droop.   EXTREMITIES: pulses normal/equal, full ROM SKIN:  urticaria noted  to extremities PSYCH: no abnormalities of mood noted, alert and oriented to situation   ED Treatments / Results  Labs (all labs ordered are listed, but only abnormal results are displayed) Labs Reviewed - No data to display  EKG  EKG Interpretation None       Radiology No results found.  Procedures Procedures   CRITICAL CARE Performed by: Sharyon Cable Total critical care time: 35 minutes Critical care time was exclusive of separately billable procedures and treating other patients. Critical care was necessary to treat or prevent imminent or life-threatening deterioration. Critical care was time spent personally by me on the following activities: development of treatment plan with patient and/or surrogate as well as nursing, discussions with consultants, evaluation of patient's response to treatment, examination of patient, obtaining history from patient or surrogate, ordering and performing treatments and interventions, ordering and review of laboratory studies, ordering and review of radiographic studies, pulse oximetry and re-evaluation of patient's condition. PATIENT WITH ANAPHYLACTIC REACTION REQUIRING EPIPEN, MONITORING FOR 4 HOURS IN THE ER  Medications Ordered in ED Medications  albuterol (PROVENTIL) (2.5 MG/3ML) 0.083% nebulizer solution 5 mg (5 mg Nebulization Given 05/29/17 2238)  EPINEPHrine (EPI-PEN) injection 0.3 mg (0.3 mg Intramuscular Given 05/29/17 2358)  diphenhydrAMINE (BENADRYL) injection 25 mg (25 mg Intravenous Given 05/30/17 0000)  famotidine (PEPCID) IVPB 20 mg premix (0 mg Intravenous Stopped 05/30/17 0138)  methylPREDNISolone sodium succinate (SOLU-MEDROL) 125 mg/2 mL injection 125 mg (125 mg Intravenous Given 05/29/17 2359)     Initial Impression / Assessment and Plan / ED Course  I have reviewed the triage vital signs and the nursing notes.      11:54 PM Pt with allergic rxn to meds - new meds were tessalon/levaquin and added to allergy  list Will follow closely 1:02 AM Pt improved He is resting comfortably 4:02 AM PT MONITORED FOR 4 HRS AND IMPROVED NO FACIAL SWELLING STILL WITH RESIDUAL WHEEZE, THOUGH SUSPECT THIS WAS PRESENT PRIOR TO REACTION PLAN = STOP LEVAQUIN/TESSALON INCREASE PREDNISONE TO 40MG  DAILY EPIPEN ORDERED AT DISCHARGE WE DISCUSSED APPROPRIATE USE OF EPIPEN  Final Clinical Impressions(s) / ED Diagnoses   Final diagnoses:  Anaphylaxis, initial encounter  Allergic reaction, initial encounter    ED Discharge Orders        Ordered    EPINEPHrine 0.3 mg/0.3 mL IJ SOAJ injection   Once     05/30/17 0357    albuterol (PROVENTIL HFA;VENTOLIN HFA) 108 (90 Base) MCG/ACT inhaler  Every 6 hours PRN     05/30/17 0357       Ripley Fraise, MD 05/30/17 8921

## 2017-05-29 NOTE — ED Triage Notes (Signed)
Nurse first notified on pt.'s condition - will move to next available bed .

## 2017-05-29 NOTE — ED Triage Notes (Signed)
Patient reports itchy hands and feet with lower lip swelling , mild wheezing and bilateral eyes itching onset this evening , pt. suspects allergic reaction from medications prescribed for his sinusitis . Airway intact .

## 2017-05-30 MED ORDER — ALBUTEROL SULFATE HFA 108 (90 BASE) MCG/ACT IN AERS: 1.0000 | INHALATION_SPRAY | Freq: Four times a day (QID) | RESPIRATORY_TRACT | 0 refills | Status: DC | PRN

## 2017-05-30 MED ORDER — EPINEPHRINE 0.3 MG/0.3ML IJ SOAJ: 0.3000 mg | Freq: Once | INTRAMUSCULAR | 1 refills | Status: AC

## 2017-05-30 NOTE — Discharge Instructions (Signed)
STOP LEVAQUIN AND TESSALON COUGH MEDICINE YOU CAN TAKE PREDNISONE 40MG  (2 TABLETS) ONCE A DAY UNTIL YOU FINISH THE MEDICINE I WOULD NOT ADD ANY NEW ANTIBIOTICS IF YOU HAVE ANY FACIAL/TONGUE SWELLING YOU WILL NEED TO USE THE EPIPEN AND CALL 911

## 2017-05-30 NOTE — ED Notes (Signed)
PT states understanding of care given, follow up care, and medication prescribed. PT ambulated from ED to car with a steady gait. 

## 2017-06-07 ENCOUNTER — Ambulatory Visit: Payer: PPO | Admitting: Infectious Diseases

## 2017-06-12 ENCOUNTER — Encounter: Payer: Self-pay | Admitting: Primary Care

## 2017-06-12 DIAGNOSIS — J209 Acute bronchitis, unspecified: Secondary | ICD-10-CM | POA: Diagnosis not present

## 2017-06-12 DIAGNOSIS — I10 Essential (primary) hypertension: Secondary | ICD-10-CM | POA: Diagnosis not present

## 2017-06-12 DIAGNOSIS — B20 Human immunodeficiency virus [HIV] disease: Secondary | ICD-10-CM | POA: Diagnosis not present

## 2017-06-13 ENCOUNTER — Other Ambulatory Visit: Payer: Self-pay | Admitting: Endocrinology

## 2017-06-13 ENCOUNTER — Other Ambulatory Visit: Payer: Self-pay | Admitting: Allergy and Immunology

## 2017-06-13 NOTE — Progress Notes (Deleted)
Patient ID: Steven [name], male   DOB: Apr 25, 1981, 36 y.o.   MRN: [id number]   Reason for Appointment:  Hypothyroidism, follow-up visit    History of Present Illness:   HYPOTHYROIDISM  was first diagnosed in ? Early 2013 He previously had hyperthyroidism which was treated with radioactive iodine in 05/2011 He had been taking Synthroid 200 mcg daily initially and it was then reduced to 175 mcg Because of persistently low TSH the dose was again reduced in 08/2014 down to 150 g  He has been a stable dose of levothyroxine since 08/2014 He takes his medication consistently daily before breakfast, usually an hour before eating  Does not complain of any unusual fatigue, shakiness or heat or cold intolerance   Wt Readings from Last 3 Encounters:  05/29/17 293 lb (132.9 kg)  03/28/17 293 lb 1.9 oz (133 kg)  11/30/16 294 lb (133.4 kg)    Labs:  Lab Results  Component Value Date   TSH 4.62 (H) 11/29/2016   TSH 2.43 12/22/2015   TSH 0.82 11/12/2014   FREET4 1.25 11/29/2016   FREET4 1.36 12/22/2015   FREET4 1.15 11/12/2014    OTHER active problems including diabetes are discussed in review of systems:    Past Medical History:  Diagnosis Date  . Asthma   . Cancer ([place])   . Diabetes mellitus without complication ([place])   . HIV positive ([place]) 03/23/09   Genotype Y181C  . HTN (hypertension)   . Kaposi's sarcoma   . Syphilis 03/23/09-   1:2    Past Surgical History:  Procedure Laterality Date  . BRAIN SURGERY    . IR GENERIC HISTORICAL  02/12/2016   IR REMOVAL TUN ACCESS W/ PORT W/O FL MOD SED 02/12/2016 [name], MD [facility]    Family History  Problem Relation Age of Onset  . Pancreatic cancer Mother   . Cancer Mother   . Diabetes Paternal Grandmother   . Thyroid disease Paternal Grandmother     Social History:  reports that  has never smoked. he has never used smokeless tobacco. He reports that he drinks alcohol. He reports that he does not use  drugs.  Allergies:  Allergies  Allergen Reactions  . Lisinopril Swelling    Swelling of lower lip while on lisinopril  . Penicillins Hives and Swelling    REACTION: rash and swelling  . Levaquin [Levofloxacin In D5w] Rash  . Tessalon [Benzonatate] Rash  . Aspirin Nausea Only    Allergies as of 06/14/2017      Reactions   Lisinopril Swelling   Swelling of lower lip while on lisinopril   Penicillins Hives, Swelling   REACTION: rash and swelling   Levaquin [levofloxacin In D5w] Rash   Tessalon [benzonatate] Rash   Aspirin Nausea Only      Medication List        Accurate as of 06/13/17  9:14 PM. Always use your most recent med list.          abacavir-dolutegravir-lamiVUDine 600-50-300 MG tablet Commonly known as:  TRIUMEQ Take 1 tablet by mouth daily.   albuterol 108 (90 Base) MCG/ACT inhaler Commonly known as:  VENTOLIN HFA Inhale two puffs every 4-6 hours if needed for cough or wheeze   albuterol 108 (90 Base) MCG/ACT inhaler Commonly known as:  PROVENTIL HFA;VENTOLIN HFA Inhale 1-2 puffs every 6 (six) hours as needed into the lungs for wheezing or shortness of breath.   amLODipine 10 MG tablet Commonly known as:  NORVASC  Take 1 tablet (10 mg total) by mouth daily.   esomeprazole 40 MG capsule Commonly known as:  NEXIUM TAKE ONE CAPSULE BY MOUTH ONCE DAILY AS DIRECTED   fexofenadine 180 MG tablet Commonly known as:  ALLEGRA Take 1 tablet (180 mg total) by mouth daily.   fluticasone 110 MCG/ACT inhaler Commonly known as:  FLOVENT HFA Inhale 2 puffs into the lungs 2 (two) times daily.   fluticasone 50 MCG/ACT nasal spray Commonly known as:  FLONASE Use two sprays in each nostril daily as directed   fluticasone-salmeterol 230-21 MCG/ACT inhaler Commonly known as:  ADVAIR HFA Inhale two puffs twice daily to prevent cough or wheeze   glucose blood test strip Commonly known as:  TRUETEST TEST Use as instructed for 3 times daily testing of blood glucose    hydrochlorothiazide 25 MG tablet Commonly known as:  HYDRODIURIL Take 1 tablet (25 mg total) by mouth daily.   ibuprofen 200 MG tablet Commonly known as:  ADVIL,MOTRIN Take 400 mg every 6 (six) hours as needed by mouth for fever or mild pain.   levothyroxine 150 MCG tablet Commonly known as:  SYNTHROID, LEVOTHROID TAKE 1 TABLET BY MOUTH DAILY   levothyroxine 150 MCG tablet Commonly known as:  SYNTHROID, LEVOTHROID TAKE 1 TABLET BY MOUTH DAILY   levothyroxine 150 MCG tablet Commonly known as:  SYNTHROID, LEVOTHROID TAKE 1 TABLET BY MOUTH DAILY   metFORMIN 500 MG 24 hr tablet Commonly known as:  GLUCOPHAGE-XR TAKE 3 TABLETS(1500 MG) BY MOUTH DAILY WITH SUPPER   montelukast 10 MG tablet Commonly known as:  SINGULAIR TAKE 1 TABLET BY MOUTH ONCE DAILY AS DIRECTED   multivitamin tablet Take 1 tablet by mouth daily.   potassium chloride SA 20 MEQ tablet Commonly known as:  K-DUR,KLOR-CON Take 1 tablet (20 mEq total) by mouth daily.   QVAR REDIHALER 80 MCG/ACT inhaler Generic drug:  beclomethasone Inhale 2 puffs 2 (two) times daily into the lungs.   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    DIABETES: He had been previously treated with glipizide for severe hyperglycemia in 2016 Last year on his visit he was given metformin instead of glipizide which was causing hypoglycemia He is going to establish with a new PCP now He tends to have falsely low A1c and his lab glucose was over 200 recently He thinks his blood sugars are not over 200 after meals and near 100 in the mornings    Lab Results  Component Value Date   HGBA1C 6.2 03/28/2017   HGBA1C 6.6 07/20/2016   HGBA1C 6.8 12/25/2015   Lab Results  Component Value Date   MICROALBUR 4.3 07/20/2016   LDLCALC 74 11/16/2016   CREATININE 1.63 (H) 05/24/2017      CARDIOLOGY:   history of high blood pressure treated by PCP            Examination:    There were no vitals taken for this visit.  He looks well. 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   Koi Zangara 06/13/2017, 9:14 PM

## 2017-06-14 ENCOUNTER — Ambulatory Visit: Payer: PPO | Admitting: Podiatry

## 2017-06-14 ENCOUNTER — Ambulatory Visit: Payer: PPO | Admitting: Endocrinology

## 2017-06-14 ENCOUNTER — Encounter: Payer: Self-pay | Admitting: Podiatry

## 2017-06-14 DIAGNOSIS — B351 Tinea unguium: Secondary | ICD-10-CM | POA: Diagnosis not present

## 2017-06-14 DIAGNOSIS — M79676 Pain in unspecified toe(s): Secondary | ICD-10-CM | POA: Diagnosis not present

## 2017-06-14 DIAGNOSIS — L603 Nail dystrophy: Secondary | ICD-10-CM | POA: Diagnosis not present

## 2017-06-14 DIAGNOSIS — L601 Onycholysis: Secondary | ICD-10-CM | POA: Diagnosis not present

## 2017-06-14 NOTE — Addendum Note (Signed)
Addended byDeidre Ala, Shloma Roggenkamp L on: 06/14/2017 04:59 PM   Modules accepted: Orders

## 2017-06-14 NOTE — Progress Notes (Addendum)
Complaint:  Visit Type: Patient returns to my office for continued preventative foot care services. Complaint: Patient states" my nails have grown long and thick and become painful to walk and wear shoes"  The patient presents for preventative foot care services. No changes to ROS.  Patient has not been seen for 7 months.  Podiatric Exam: Vascular: dorsalis pedis and posterior tibial pulses are palpable bilateral. Capillary return is immediate. Temperature gradient is WNL. Skin turgor WNL  Sensorium: Normal Semmes Weinstein monofilament test. Normal tactile sensation bilaterally. Nail Exam: Pt has thick disfigured discolored nails with subungual debris noted bilateral entire nail hallux through fifth toenails Ulcer Exam: There is no evidence of ulcer or pre-ulcerative changes or infection. Orthopedic Exam: Muscle tone and strength are WNL. No limitations in general ROM. No crepitus or effusions noted. Foot type and digits show no abnormalities. Bony prominences are unremarkable. Skin: No Porokeratosis. No infection or ulcers  Diagnosis:  Onychomycosis, , Pain in right toe, pain in left toes  Treatment & Plan Procedures and Treatment: Consent by patient was obtained for treatment procedures. The patient understood the discussion of treatment and procedures well. All questions were answered thoroughly reviewed. Debridement of mycotic and hypertrophic toenails, 1 through 5 bilateral and clearing of subungual debris. No ulceration, no infection noted.  Nail sample to be sent to lab. Return Visit-Office Procedure: Patient instructed to return to the office for a follow up visit 3 months for continued evaluation and treatment.    Gardiner Barefoot DPM

## 2017-06-21 ENCOUNTER — Other Ambulatory Visit: Payer: Self-pay | Admitting: Allergy and Immunology

## 2017-06-21 NOTE — Telephone Encounter (Signed)
RF on Flovent denied, pt needs an OV

## 2017-06-27 ENCOUNTER — Encounter: Payer: PPO | Admitting: Primary Care

## 2017-06-28 ENCOUNTER — Ambulatory Visit: Payer: PPO | Admitting: Infectious Diseases

## 2017-07-01 ENCOUNTER — Other Ambulatory Visit: Payer: Self-pay | Admitting: Internal Medicine

## 2017-07-01 DIAGNOSIS — I10 Essential (primary) hypertension: Secondary | ICD-10-CM

## 2017-07-06 ENCOUNTER — Telehealth: Payer: Self-pay | Admitting: Endocrinology

## 2017-07-06 NOTE — Telephone Encounter (Signed)
Pt needs the levothyroxine called into [facility] on [address]

## 2017-07-07 ENCOUNTER — Other Ambulatory Visit: Payer: Self-pay

## 2017-07-07 MED ORDER — LEVOTHYROXINE SODIUM 150 MCG PO TABS: 150.0000 ug | ORAL_TABLET | Freq: Every day | ORAL | 2 refills | Status: DC

## 2017-07-07 NOTE — Telephone Encounter (Signed)
This prescription has been sent to the pharmacy for the patient.

## 2017-07-12 ENCOUNTER — Encounter: Payer: PPO | Admitting: Primary Care

## 2017-07-12 ENCOUNTER — Ambulatory Visit: Payer: PPO | Admitting: Allergy

## 2017-07-12 DIAGNOSIS — Z0289 Encounter for other administrative examinations: Secondary | ICD-10-CM

## 2017-07-13 ENCOUNTER — Emergency Department: Payer: PPO

## 2017-07-13 ENCOUNTER — Emergency Department
Admission: EM | Admit: 2017-07-13 | Discharge: 2017-07-13 | Disposition: A | Discharge status: Home / Self Care | Payer: PPO | Attending: Emergency Medicine | Admitting: Emergency Medicine

## 2017-07-13 ENCOUNTER — Other Ambulatory Visit: Payer: Self-pay

## 2017-07-13 DIAGNOSIS — C469 Kaposi's sarcoma, unspecified: Secondary | ICD-10-CM | POA: Diagnosis not present

## 2017-07-13 DIAGNOSIS — Z79899 Other long term (current) drug therapy: Secondary | ICD-10-CM | POA: Insufficient documentation

## 2017-07-13 DIAGNOSIS — R0602 Shortness of breath: Secondary | ICD-10-CM | POA: Diagnosis not present

## 2017-07-13 DIAGNOSIS — N183 Chronic kidney disease, stage 3 (moderate): Secondary | ICD-10-CM | POA: Diagnosis not present

## 2017-07-13 DIAGNOSIS — I129 Hypertensive chronic kidney disease with stage 1 through stage 4 chronic kidney disease, or unspecified chronic kidney disease: Secondary | ICD-10-CM | POA: Diagnosis not present

## 2017-07-13 DIAGNOSIS — E1122 Type 2 diabetes mellitus with diabetic chronic kidney disease: Secondary | ICD-10-CM | POA: Insufficient documentation

## 2017-07-13 DIAGNOSIS — E876 Hypokalemia: Secondary | ICD-10-CM | POA: Insufficient documentation

## 2017-07-13 DIAGNOSIS — E039 Hypothyroidism, unspecified: Secondary | ICD-10-CM | POA: Insufficient documentation

## 2017-07-13 DIAGNOSIS — R220 Localized swelling, mass and lump, head: Secondary | ICD-10-CM | POA: Diagnosis present

## 2017-07-13 DIAGNOSIS — T7840XA Allergy, unspecified, initial encounter: Secondary | ICD-10-CM | POA: Insufficient documentation

## 2017-07-13 DIAGNOSIS — J40 Bronchitis, not specified as acute or chronic: Secondary | ICD-10-CM | POA: Diagnosis not present

## 2017-07-13 DIAGNOSIS — Z21 Asymptomatic human immunodeficiency virus [HIV] infection status: Secondary | ICD-10-CM | POA: Diagnosis not present

## 2017-07-13 DIAGNOSIS — R05 Cough: Secondary | ICD-10-CM | POA: Diagnosis not present

## 2017-07-13 LAB — COMPREHENSIVE METABOLIC PANEL
ALT: 26 U/L (ref 17–63)
ANION GAP: 10 (ref 5–15)
AST: 25 U/L (ref 15–41)
Albumin: 4.4 g/dL (ref 3.5–5.0)
Alkaline Phosphatase: 58 U/L (ref 38–126)
BILIRUBIN TOTAL: 1.9 mg/dL — AB (ref 0.3–1.2)
BUN: 9 mg/dL (ref 6–20)
CO2: 30 mmol/L (ref 22–32)
Calcium: 8.9 mg/dL (ref 8.9–10.3)
Chloride: 98 mmol/L — ABNORMAL LOW (ref 101–111)
Creatinine, Ser: 1.15 mg/dL (ref 0.61–1.24)
GFR calc Af Amer: >60 mL/min (ref >60)
Glucose, Bld: 123 mg/dL — ABNORMAL HIGH (ref 65–99)
POTASSIUM: 2.8 mmol/L — AB (ref 3.5–5.1)
Sodium: 138 mmol/L (ref 135–145)
Total Protein: 7.7 g/dL (ref 6.5–8.1)

## 2017-07-13 LAB — CBC WITH DIFFERENTIAL/PLATELET
BASOS ABS: 0 10*3/uL (ref 0–0.1)
Basophils Relative: 1 %
EOS PCT: 2 %
Eosinophils Absolute: 0.1 10*3/uL (ref 0–0.7)
HCT: 48 % (ref 40.0–52.0)
Hemoglobin: 15.1 g/dL (ref 13.0–18.0)
LYMPHS PCT: 23 %
Lymphs Abs: 1.1 10*3/uL (ref 1.0–3.6)
MCH: 25.9 pg — ABNORMAL LOW (ref 26.0–34.0)
MCHC: 31.5 g/dL — ABNORMAL LOW (ref 32.0–36.0)
MCV: 82.2 fL (ref 80.0–100.0)
Monocytes Absolute: 0.5 10*3/uL (ref 0.2–1.0)
Monocytes Relative: 10 %
Neutro Abs: 3.2 10*3/uL (ref 1.4–6.5)
Neutrophils Relative %: 64 %
PLATELETS: 166 10*3/uL (ref 150–440)
RBC: 5.85 MIL/uL (ref 4.40–5.90)
RDW: 14.5 % (ref 11.5–14.5)
WBC: 4.9 10*3/uL (ref 3.8–10.6)

## 2017-07-13 MED ORDER — IPRATROPIUM-ALBUTEROL 0.5-2.5 (3) MG/3ML IN SOLN
3.0000 mL | Freq: Once | RESPIRATORY_TRACT | Status: AC
  Administered 2017-07-13: 3 mL via RESPIRATORY_TRACT
  Filled 2017-07-13: qty 3

## 2017-07-13 MED ORDER — PREDNISONE 10 MG PO TABS: ORAL_TABLET | ORAL | 0 refills | Status: DC

## 2017-07-13 MED ORDER — FAMOTIDINE 20 MG PO TABS
40.0000 mg | ORAL_TABLET | Freq: Once | ORAL | Status: AC
  Administered 2017-07-13: 40 mg via ORAL

## 2017-07-13 MED ORDER — DIPHENHYDRAMINE HCL 50 MG/ML IJ SOLN
INTRAMUSCULAR | Status: AC
  Administered 2017-07-13: 50 mg via INTRAVENOUS
  Filled 2017-07-13: qty 1

## 2017-07-13 MED ORDER — POTASSIUM CHLORIDE 10 MEQ/100ML IV SOLN
10.0000 meq | Freq: Once | INTRAVENOUS | Status: AC
  Administered 2017-07-13: 10 meq via INTRAVENOUS
  Filled 2017-07-13: qty 100

## 2017-07-13 MED ORDER — DIPHENHYDRAMINE HCL 50 MG/ML IJ SOLN
50.0000 mg | Freq: Once | INTRAMUSCULAR | Status: AC
  Administered 2017-07-13: 50 mg via INTRAVENOUS

## 2017-07-13 MED ORDER — POTASSIUM CHLORIDE CRYS ER 20 MEQ PO TBCR
40.0000 meq | EXTENDED_RELEASE_TABLET | Freq: Once | ORAL | Status: AC
  Administered 2017-07-13: 40 meq via ORAL
  Filled 2017-07-13: qty 2

## 2017-07-13 MED ORDER — FAMOTIDINE 20 MG PO TABS
ORAL_TABLET | ORAL | Status: AC
  Administered 2017-07-13: 40 mg via ORAL
  Filled 2017-07-13: qty 2

## 2017-07-13 MED ORDER — PREDNISONE 20 MG PO TABS
50.0000 mg | ORAL_TABLET | Freq: Once | ORAL | Status: AC
  Administered 2017-07-13: 50 mg via ORAL
  Filled 2017-07-13: qty 2

## 2017-07-13 NOTE — ED Notes (Signed)
Pt was in NAD and reports feeling safe to leave ED. PT left at 1822. Delay in documentation.

## 2017-07-13 NOTE — ED Notes (Signed)
Pt reports taking alka seltzer cold at 1115 this morning for congestion.  20 minutes later pt began swelling of tongue, eyes and face.  No resp distress.  Pt left work and came to the hospital.  Pt was given iv meds in triage and states swelling improved.  However, pt continues to have sinus congestion.  Pt alert  Speech clear.  Pt has swelling to eyelids.  No itching.

## 2017-07-13 NOTE — ED Provider Notes (Signed)
[facility] Emergency Department Provider Note ____________________________________________   I have reviewed the triage vital signs and the triage nursing note.  HISTORY  Chief Complaint Facial Swelling   Historian Patient  HPI Steven [name] is a 37 y.o. male presents with facial swelling after trying Alka-Seltzer.  He tried Alka-Seltzer because he is been having sinus congestion and wheezing with a history of underlying asthma.  Patient states that he felt his tongue was swelling in his throat was feeling little bit in both eyes eyelids were swelling.  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  He does not give [place] of any epi.  From the recent sinus congestion and wheezing, he was going to follow with his primary care doctor because he thought he might need a course of doxycycline.  He has not had fevers.   Past Medical History:  Diagnosis Date  . Asthma   . Cancer ([place])   . Diabetes mellitus without complication ([place])   . HIV positive ([place]) 03/23/09   Genotype Y181C  . HTN (hypertension)   . Kaposi's sarcoma   . Syphilis 03/23/09-   1:2    Patient Active Problem List   Diagnosis Date Noted  . GERD (gastroesophageal reflux disease) 03/28/2017  . Asthma 03/28/2017  . Diabetes mellitus type 2 with complications, uncontrolled ([place]) 03/25/2015  . CKD (chronic kidney disease) stage 3, GFR 30-59 ml/min ([facility]) 03/23/2015  . Oral thrush 03/23/2015  . Essential hypertension 11/24/2014  . Post-nasal drip 02/05/2014  . 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 Arthralgia of elbow, right 07/29/2013  . Colitis 05/27/2011  . KS (Kaposi's sarcoma) ([place]) 04/11/2011  . 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 Human immunodeficiency virus (HIV) disease ([place]) 04/10/2009  . Lymphedema 04/10/2009  . SYPHILIS 04/06/2009    Past Surgical History:  Procedure Laterality Date  . BRAIN SURGERY    . IR  GENERIC HISTORICAL  02/12/2016   IR REMOVAL TUN ACCESS W/ PORT W/O FL MOD SED 02/12/2016 [name], MD [facility]    Prior to Admission medications   Medication Sig Start Date End Date Taking? Authorizing Provider  abacavir-dolutegravir-lamiVUDine (TRIUMEQ) 600-50-300 MG tablet Take 1 tablet by mouth daily. 11/30/16   Campbell Riches, MD  albuterol (PROVENTIL HFA;VENTOLIN HFA) 108 (90 Base) MCG/ACT inhaler Inhale 1-2 puffs every 6 (six) hours as needed into the lungs for wheezing or shortness of breath. 05/30/17   Ripley Fraise, MD  albuterol (VENTOLIN HFA) 108 (90 Base) MCG/ACT inhaler Inhale two puffs every 4-6 hours if needed for cough or wheeze 02/12/16   Kozlow, Donnamarie Poag, MD  amLODipine (NORVASC) 10 MG tablet Take 1 tablet (10 mg total) by mouth daily. 03/28/17   Pleas [name], NP  beclomethasone (QVAR REDIHALER) 80 MCG/ACT inhaler Inhale 2 puffs 2 (two) times daily into the lungs. 08/30/16   [provider]  esomeprazole (NEXIUM) 40 MG capsule TAKE ONE CAPSULE BY MOUTH ONCE DAILY AS DIRECTED 02/06/17   Kozlow, Donnamarie Poag, MD  fexofenadine (ALLEGRA) 180 MG tablet Take 1 tablet (180 mg total) by mouth daily. 05/03/12   Harden Mo, MD  fluticasone Asencion Islam) 50 MCG/ACT nasal spray Use two sprays in each nostril daily as directed 08/10/16   Kozlow, Donnamarie Poag, MD  fluticasone (FLOVENT HFA) 110 MCG/ACT inhaler Inhale 2 puffs into the lungs 2 (two) times daily. 09/23/16   Kozlow, Donnamarie Poag, MD  fluticasone-salmeterol (ADVAIR HFA) [phone] MCG/ACT inhaler Inhale two puffs twice daily  to prevent cough or wheeze 02/12/16   Kozlow, Donnamarie Poag, MD  glucose blood (TRUETEST TEST) test strip Use as instructed for 3 times daily testing of blood glucose 08/12/16   Jegede, Olugbemiga E, MD  hydrochlorothiazide (HYDRODIURIL) 25 MG tablet Take 1 tablet (25 mg total) by mouth daily. 03/28/17   Pleas [name], NP  ibuprofen (ADVIL,MOTRIN) 200 MG tablet Take 400 mg every 6 (six) hours as needed by mouth for fever or  mild pain.    [provider]  levothyroxine (SYNTHROID, LEVOTHROID) 150 MCG tablet TAKE 1 TABLET BY MOUTH DAILY 05/23/17   Elayne Snare, MD  levothyroxine (SYNTHROID, LEVOTHROID) 150 MCG tablet TAKE 1 TABLET BY MOUTH DAILY 06/13/17   Elayne Snare, MD  levothyroxine (SYNTHROID, LEVOTHROID) 150 MCG tablet Take 1 tablet (150 mcg total) by mouth daily. 07/07/17   Elayne Snare, MD  metFORMIN (GLUCOPHAGE-XR) 500 MG 24 hr tablet TAKE 3 TABLETS(1500 MG) BY MOUTH DAILY WITH SUPPER 07/20/16   Jegede, Olugbemiga E, MD  montelukast (SINGULAIR) 10 MG tablet TAKE 1 TABLET BY MOUTH ONCE DAILY AS DIRECTED 09/22/16   Kozlow, Donnamarie Poag, MD  Multiple Vitamin (MULTIVITAMIN) tablet Take 1 tablet by mouth daily.    [provider]  potassium chloride SA (K-DUR,KLOR-CON) 20 MEQ tablet Take 1 tablet (20 mEq total) by mouth daily. 11/30/16   Campbell Riches, MD  predniSONE (DELTASONE) 10 MG tablet 50mg  once daily for 4 more days 07/13/17   Lisa Roca, MD  TRUEPLUS LANCETS 28G MISC USE TO TEST BLOOD SUGAR ONCE A DAY 07/12/16   Tresa Garter, MD    Allergies  Allergen Reactions  . Lisinopril Swelling    Swelling of lower lip while on lisinopril  . Penicillins Hives and Swelling    REACTION: rash and swelling  . Levaquin [Levofloxacin In D5w] Rash  . Tessalon [Benzonatate] Rash  . Aspirin Nausea Only    Family History  Problem Relation Age of Onset  . Pancreatic cancer Mother   . Cancer Mother   . Diabetes Paternal Grandmother   . Thyroid disease Paternal Grandmother     Social History Social History   Tobacco Use  . Smoking status: Never Smoker  . Smokeless tobacco: Never Used  Substance Use Topics  . Alcohol use: Yes    Alcohol/week: 0.0 oz    Comment: socially - less now  . Drug use: No    Review of Systems  Constitutional: Negative for fever. Eyes: Negative for visual changes.  Bilateral eyelid swelling without redness. ENT: Negative for sore throat.  Today for some throat  fullness.  Positive for feeling of tongue swelling. Cardiovascular: Negative for chest pain. Respiratory: Positive for wheezing and for shortness of breath, for several days, not worse related to the allergic symptoms. Gastrointestinal: Negative for abdominal pain, vomiting and diarrhea. Genitourinary: Negative for dysuria. Musculoskeletal: Negative for back pain. Skin: Negative for rash. Neurological: Negative for headache.  ____________________________________________   PHYSICAL EXAM:  VITAL SIGNS: ED Triage Vitals  Enc Vitals Group     BP 07/13/17 1321 (!) 141/75     Pulse Rate 07/13/17 1321 (!) 104     Resp 07/13/17 1321 (!) 22     Temp 07/13/17 1321 99.1 F (37.3 C)     Temp Source 07/13/17 1321 Oral     SpO2 07/13/17 1321 95 %     Weight 07/13/17 1323 295 lb (133.8 kg)     Height 07/13/17 1323 6\' 1"  (1.854 m)  Head Circumference --      Peak Flow --      Pain Score 07/13/17 1328 0     Pain Loc --      Pain Edu? --      Excl. in [place]? --      Constitutional: Alert and oriented. Well appearing and in no distress. HEENT   Head: Normocephalic and atraumatic.      Eyes: Conjunctivae are normal. Pupils equal and round.       Ears:         Nose: No congestion/rhinnorhea.  Has congestion.   Mouth/Throat: Mucous membranes are moist.   Neck: No stridor. Cardiovascular/Chest: Normal rate, regular rhythm.  No murmurs, rubs, or gallops. Respiratory: Normal respiratory effort without tachypnea nor retractions. Breath sounds are clear and equal bilaterally.  All the next Tory wheeze, very bronchospastic cough.  He does have sinus congestion. Gastrointestinal: Soft. No distention, no guarding, no rebound. Nontender.    Genitourinary/rectal:Deferred Musculoskeletal: Nontender with normal range of motion in all extremities. No joint effusions.  No lower extremity tenderness.  Chronic leg edema, 3+, bilaterally. Neurologic:  Normal speech and language. No gross or focal  neurologic deficits are appreciated. Skin:  Skin is warm, dry and intact. No rash noted. Psychiatric: Mood and affect are normal. Speech and behavior are normal. Patient exhibits appropriate insight and judgment.   ____________________________________________  LABS (pertinent positives/negatives) I, Lisa Roca, MD the attending physician have reviewed the labs noted below.  Labs Reviewed  COMPREHENSIVE METABOLIC PANEL - Abnormal; Notable for the following components:      Result Value   Potassium 2.8 (*)    Chloride 98 (*)    Glucose, Bld 123 (*)    Total Bilirubin 1.9 (*)    All other components within normal limits  CBC WITH DIFFERENTIAL/PLATELET - Abnormal; Notable for the following components:   MCH 25.9 (*)    MCHC 31.5 (*)    All other components within normal limits    ____________________________________________    EKG I, Lisa Roca, MD, the attending physician have personally viewed and interpreted all ECGs.  91 bpm Normal sinus rhythm.  Narrow qrs.  normal axis.  Normal ST and T wave ____________________________________________  RADIOLOGY All Xrays were viewed by me.  Imaging interpreted by Radiologist, and I, Lisa Roca, MD the attending physician have reviewed the radiologist interpretation noted below.  Chest x-ray two-view:  IMPRESSION: No active cardiopulmonary disease. __________________________________________  PROCEDURES  Procedure(s) performed: None  Critical Care performed: None   ____________________________________________  ED COURSE / ASSESSMENT AND PLAN  Pertinent labs & imaging results that were available during my care of the patient were reviewed by me and considered in my medical decision making (see chart for details).    Patient states he has been having a sinus congestion for several days, about a month ago he took doxycycline for upper respiratory condition and was thinking about seeing his doctor again for another  prescription.  Today he was having still congestion and a mild wheezing cough which is been taking his albuterol, and he tried Alka-Seltzer and right after that he started developing facial swelling including bilateral eyelid swelling and some tongue swelling and mild swelling in the throat.  Patient received Benadryl and Pepcid in triage and on my exam now over an hour later is reporting tongue and throat discomfort is relieved.  I swelling is much improved although still there slightly.  He does have allergic reaction for which he keeps an  EpiPen.  In terms of the sinus congestion, I do not have a high suspicious for a bacterial infection.  He does have a slight bronchitic cough.  His chest x-ray is clear for pneumonia.  I discussed with him that I think prednisone and albuterol would be more helpful for his bronchospastic cough rather than adding antibiotics.  We discussed every 4 Benadryl and daily Pepcid as well as a course of prednisone.  DIFFERENTIAL DIAGNOSIS: Including but not limited to angioedema, anaphylaxis, allergic reaction, sinus congestion, pneumonia, asthma better.  CONSULTATIONS:   None   Patient / Family / Caregiver informed of clinical course, medical decision-making process, and agree with plan.   I discussed return precautions, follow-up instructions, and discharge instructions with patient and/or family.  Discharge Instructions : You are evaluated for facial swelling, which I suspect is due to an allergic reaction.  As we discussed, symptoms could come back over the next 2 days even without rate exposure.  You may take over-the-counter Benadryl 25 mg every 4 hours as needed for any swelling or itching.  Return to the emergency room immediately for any worsening swelling especially any tongue swelling or throat swelling or trouble breathing or trouble swallowing.  In terms of your congestion, you may try over-the-counter Mucinex.  I am also recommending a course of  prednisone and continued every 4-hour use of albuterol to help with the wheezing.  Your potassium level was slightly low and you are given potassium here in the emergency department.    ___________________________________________   FINAL CLINICAL IMPRESSION(S) / ED DIAGNOSES   Final diagnoses:  Hypokalemia  Allergic reaction, initial encounter  Bronchitis      ___________________________________________        Note: This dictation was prepared with Dragon dictation. Any transcriptional errors that result from this process are unintentional    Lisa Roca, MD 07/13/17 [phone]

## 2017-07-13 NOTE — Discharge Instructions (Signed)
You are evaluated for facial swelling, which I suspect is due to an allergic reaction.  As we discussed, symptoms could come back over the next 2 days even without rate exposure.  You may take over-the-counter Benadryl 25 mg every 4 hours as needed for any swelling or itching.  Return to the emergency room immediately for any worsening swelling especially any tongue swelling or throat swelling or trouble breathing or trouble swallowing.  In terms of your congestion, you may try over-the-counter Mucinex.  I am also recommending a course of prednisone and continued every 4-hour use of albuterol to help with the wheezing.  Your potassium level was slightly low and you are given potassium here in the emergency department.

## 2017-07-13 NOTE — ED Triage Notes (Signed)
Pt arrives to ED for possible allergic reaction to alka seltzer cold medication. Pt has congestion and cold symptoms. Took alka seltzer and about 20 minutes later noticed swelling to outside of lips at lower lip and tongue swelling. Pt denies feeling like throat is going to close. Talking in complete sentences. No other new foods or medications today. States previous reaction to lisinopril.

## 2017-07-13 NOTE — ED Notes (Signed)
Pt states he feels like swelling in tongue is decreasing. Pt facial swelling around eyes and lower lip appear less swollen at this time. No distress noted.

## 2017-07-19 ENCOUNTER — Ambulatory Visit (INDEPENDENT_AMBULATORY_CARE_PROVIDER_SITE_OTHER): Payer: PPO | Admitting: Infectious Diseases

## 2017-07-19 VITALS — BP 138/80 | HR 100 | Temp 98.5°F | Ht 73.0 in | Wt 279.0 lb

## 2017-07-19 DIAGNOSIS — Z23 Encounter for immunization: Secondary | ICD-10-CM

## 2017-07-19 DIAGNOSIS — B20 Human immunodeficiency virus [HIV] disease: Secondary | ICD-10-CM | POA: Diagnosis not present

## 2017-07-19 DIAGNOSIS — E1165 Type 2 diabetes mellitus with hyperglycemia: Secondary | ICD-10-CM

## 2017-07-19 DIAGNOSIS — Z113 Encounter for screening for infections with a predominantly sexual mode of transmission: Secondary | ICD-10-CM

## 2017-07-19 DIAGNOSIS — I10 Essential (primary) hypertension: Secondary | ICD-10-CM | POA: Diagnosis not present

## 2017-07-19 DIAGNOSIS — E118 Type 2 diabetes mellitus with unspecified complications: Secondary | ICD-10-CM | POA: Diagnosis not present

## 2017-07-19 DIAGNOSIS — C469 Kaposi's sarcoma, unspecified: Secondary | ICD-10-CM

## 2017-07-19 DIAGNOSIS — Z79899 Other long term (current) drug therapy: Secondary | ICD-10-CM | POA: Diagnosis not present

## 2017-07-19 DIAGNOSIS — IMO0002 Reserved for concepts with insufficient information to code with codable children: Secondary | ICD-10-CM

## 2017-07-19 NOTE — Addendum Note (Signed)
Addended by: Myrtis Hopping A on: 07/19/2017 04:47 PM   Modules accepted: Orders

## 2017-07-19 NOTE — Assessment & Plan Note (Addendum)
Doing better Will come off glipizide. He has f/u appt with [facility]  Will f/u with PCP, greatly appreciate their partnering with [place].

## 2017-07-19 NOTE — Assessment & Plan Note (Addendum)
Has f/u with [facility] Appreciate their seeing him.  Hx of allergy to ACE-I

## 2017-07-19 NOTE — Progress Notes (Signed)
   Subjective:    Patient ID: Steven [name], male    DOB: 1980-12-23, 37 y.o.   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  He was previously on CTX however in 2011 he had an intracranial bleed.   He was changed from TRV/ISN in 2014 to triumeq. He had f/u with Onc on 05-23-16, was felt to be doing well.  He also has been seen by FP and endo for hypothyroidism and DM2.  Also had f/u with podiatry.   Has lost 20# since last visit. Eating healthier, exercising more. FSG have been better as well. Had increased FSG during period he was on prednisone (had taper of glipizide with this).  Has had occas sinus sx, allergies (ASA and had to go to ED) otherwise has felt well.    HIV 1 RNA Quant (copies/mL)  Date Value  05/24/2017 21 (H)  11/16/2016 <20 NOT DETECTED  04/05/2016 <20   CD4 T Cell Abs (/uL)  Date Value  05/24/2017 560  11/16/2016 510  04/05/2016 520    Review of Systems  Constitutional: Negative for appetite change and unexpected weight change.  HENT: Positive for postnasal drip.   Eyes: Positive for visual disturbance.  Respiratory: Negative for cough and shortness of breath.   Cardiovascular: Negative for chest pain.  Gastrointestinal: Negative for diarrhea and nausea.  Genitourinary: Negative for difficulty urinating and genital sores.  Neurological: Negative for numbness.  Psychiatric/Behavioral: Negative for dysphoric mood and sleep disturbance.  Please see HPI. All other systems reviewed and negative.     Objective:   Physical Exam  Constitutional: He appears well-developed and well-nourished.  HENT:  Mouth/Throat: No oropharyngeal exudate.  Eyes: EOM are normal. Pupils are equal, round, and reactive to light.  Neck: Neck supple.  Cardiovascular: Normal rate, regular rhythm and normal heart sounds.  Pulmonary/Chest: Effort normal and breath sounds normal.  Abdominal: Soft. Bowel sounds are normal. There is no  tenderness. There is no rebound.  Musculoskeletal: He exhibits no edema.  Lymphadenopathy:    He has no cervical adenopathy.       Assessment & Plan:

## 2017-07-19 NOTE — Assessment & Plan Note (Signed)
He is going to call Onc for f/u.

## 2017-07-19 NOTE — Assessment & Plan Note (Signed)
Will continue triumeq Has gotten flu shot.  Offered/refused condoms.  2nd mening? Will see him back in 9 months.

## 2017-07-21 ENCOUNTER — Other Ambulatory Visit: Payer: Self-pay | Admitting: Internal Medicine

## 2017-07-21 ENCOUNTER — Telehealth: Payer: Self-pay | Admitting: Endocrinology

## 2017-07-21 ENCOUNTER — Ambulatory Visit: Payer: PPO | Admitting: Allergy

## 2017-07-21 DIAGNOSIS — E119 Type 2 diabetes mellitus without complications: Secondary | ICD-10-CM

## 2017-07-21 NOTE — Telephone Encounter (Signed)
I contacted the patient and advised of MD's message via voicemail. Requested the patient to call his pharmacy and call the office back once he receives the information.

## 2017-07-21 NOTE — Telephone Encounter (Deleted)
Patient stated he need another brand for this medication levothyroxine (SYNTHROID, LEVOTHROID) 150 MCG tablet [[id number]] , it is on back order   Send to  [place], [place] AT [place] DEA #:  [id number]

## 2017-07-21 NOTE — Telephone Encounter (Signed)
He needs to find out from the pharmacy what is available

## 2017-07-21 NOTE — Telephone Encounter (Signed)
Please advise, Thanks!

## 2017-07-21 NOTE — Telephone Encounter (Signed)
Patient stated he need another brand for this medication levothyroxine (SYNTHROID, LEVOTHROID) 150 MCG tablet [[id number]] , it is on back order   Send to  [place], [place] AT [place] DEA #:  [id number]

## 2017-07-24 ENCOUNTER — Other Ambulatory Visit: Payer: Self-pay | Admitting: Endocrinology

## 2017-07-24 NOTE — Telephone Encounter (Signed)
[facility] called about  levothyroxine (SYNTHROID, LEVOTHROID) 150 MCG tablet  Needs to speak to someone about changing the manufacturer on the script.   Please advise [phone]

## 2017-07-26 ENCOUNTER — Ambulatory Visit: Payer: PPO

## 2017-07-26 NOTE — Telephone Encounter (Signed)
I have already responded on the paper request, you may call the pharmacy about the change.  However will need to check his thyroid level again in about 4-6 weeks and see me for follow-up sooner than the scheduled 5/19

## 2017-08-01 ENCOUNTER — Other Ambulatory Visit: Payer: Self-pay | Admitting: Infectious Diseases

## 2017-08-01 DIAGNOSIS — B20 Human immunodeficiency virus [HIV] disease: Secondary | ICD-10-CM

## 2017-08-02 ENCOUNTER — Ambulatory Visit: Payer: PPO

## 2017-08-03 ENCOUNTER — Ambulatory Visit: Payer: PPO

## 2017-08-20 ENCOUNTER — Other Ambulatory Visit: Payer: Self-pay | Admitting: Internal Medicine

## 2017-09-05 ENCOUNTER — Other Ambulatory Visit: Payer: Self-pay | Admitting: Primary Care

## 2017-09-05 DIAGNOSIS — E119 Type 2 diabetes mellitus without complications: Secondary | ICD-10-CM

## 2017-09-13 ENCOUNTER — Ambulatory Visit: Payer: PPO | Admitting: Podiatry

## 2017-09-15 ENCOUNTER — Ambulatory Visit (INDEPENDENT_AMBULATORY_CARE_PROVIDER_SITE_OTHER): Payer: PPO | Admitting: Primary Care

## 2017-09-15 ENCOUNTER — Other Ambulatory Visit (INDEPENDENT_AMBULATORY_CARE_PROVIDER_SITE_OTHER): Payer: PPO

## 2017-09-15 ENCOUNTER — Encounter: Payer: Self-pay | Admitting: Primary Care

## 2017-09-15 VITALS — BP 112/78 | HR 77 | Temp 98.6°F | Ht 73.0 in | Wt 292.2 lb

## 2017-09-15 DIAGNOSIS — B20 Human immunodeficiency virus [HIV] disease: Secondary | ICD-10-CM | POA: Diagnosis not present

## 2017-09-15 DIAGNOSIS — E890 Postprocedural hypothyroidism: Secondary | ICD-10-CM

## 2017-09-15 DIAGNOSIS — E1165 Type 2 diabetes mellitus with hyperglycemia: Secondary | ICD-10-CM

## 2017-09-15 DIAGNOSIS — J4540 Moderate persistent asthma, uncomplicated: Secondary | ICD-10-CM

## 2017-09-15 DIAGNOSIS — E119 Type 2 diabetes mellitus without complications: Secondary | ICD-10-CM | POA: Diagnosis not present

## 2017-09-15 DIAGNOSIS — N183 Chronic kidney disease, stage 3 unspecified: Secondary | ICD-10-CM

## 2017-09-15 DIAGNOSIS — IMO0002 Reserved for concepts with insufficient information to code with codable children: Secondary | ICD-10-CM

## 2017-09-15 DIAGNOSIS — I10 Essential (primary) hypertension: Secondary | ICD-10-CM

## 2017-09-15 DIAGNOSIS — E118 Type 2 diabetes mellitus with unspecified complications: Secondary | ICD-10-CM | POA: Diagnosis not present

## 2017-09-15 MED ORDER — METFORMIN HCL ER 500 MG PO TB24: ORAL_TABLET | ORAL | 3 refills | Status: DC

## 2017-09-15 NOTE — Progress Notes (Signed)
Subjective:    Patient ID: Steven [name], male    DOB: 05/03/1981, 37 y.o.   MRN: [id number]  HPI  Steven [name] is a 37 year old male who presents today for follow up.  1) Hypothyroidism: Currently managed on levothyroxine 150 mcg. Currently following with endocrinology. He has an appointment with Steven [name] in May 2019.  2) Essential Hypertension: Currently managed on Amlodipine 10 mg, HCTZ 25 mg, potassium 20 mEq. He denies chest pain, shortness of breath. He is compliant to all medications including potassium.   BP Readings from Last 3 Encounters:  09/15/17 112/78  07/19/17 138/80  07/13/17 120/70   3) HIV: Currently managed on Triumeq 600-50-300 mg. Currently following with Steven [name], last evaluated in early 2019.  4) Type 2 Diabetes:   Current medications include: Metformin XR 500 mg (2 tabs in AM, 1 tab in PM)  He is checking his blood glucose twice times daily and is getting readings of: AM Fasting: 60-80 Bedtime: 95-110, sometimes 150 if he has a "bad meal"  Last A1C: 6.2 in September 2018 Last Eye Exam: Due, and plans on rescheduling.  Last Foot Exam: Due Pneumonia Vaccination: Completed in 2016 ACE/ARB: Urine microalbumin due today. No ACE/ARB Statin: None. Lipids pending.  Diet currently consists of:  Breakfast: Oatmeal, fruit, sausage biscuit Lunch: Sandwich, salad Dinner: Salad with protein, vegetables, meat, rice/pasta/potato occasional fast food,  Snacks: None Desserts: 1-2 times weekly  Beverages: Water, occasional juice   Exercise: He does not currently exercise, but he does walk most everyday.    5) Asthma: Currently managed on Qvar Redihaler daily and Ventolin HFA PRN. Also managed on Singulair, Allegra. He uses his albuterol infrequently during seasonal changes.     Review of Systems  Constitutional: Negative for fatigue and unexpected weight change.  Eyes: Negative for visual disturbance.  Respiratory: Negative for cough, shortness of  breath and wheezing.   Cardiovascular: Negative for chest pain.  Neurological: Negative for dizziness and headaches.       Past Medical History:  Diagnosis Date  . Asthma   . Cancer ([place])   . Diabetes mellitus without complication ([place])   . HIV positive ([place]) 03/23/09   Genotype Y181C  . HTN (hypertension)   . Kaposi's sarcoma   . Syphilis 03/23/09-   1:2     Social History   Socioeconomic History  . Marital status: Single    Spouse name: Not on file  . Number of children: Not on file  . Years of education: Not on file  . Highest education level: Not on file  Social Needs  . Financial resource strain: Not on file  . Food insecurity - worry: Not on file  . Food insecurity - inability: Not on file  . Transportation needs - medical: Not on file  . Transportation needs - non-medical: Not on file  Occupational History  . Not on file  Tobacco Use  . Smoking status: Never Smoker  . Smokeless tobacco: Never Used  Substance and Sexual Activity  . Alcohol use: Yes    Alcohol/week: 0.0 oz    Comment: socially - less now  . Drug use: No  . Sexual activity: Not Currently    Partners: Male    Birth control/protection: Condom    Comment: pt. declined condoms  Other Topics Concern  . Not on file  Social History Narrative   Single.    No children.   Works in Therapist, art   Enjoys DJ, traveling.  Past Surgical History:  Procedure Laterality Date  . BRAIN SURGERY    . IR GENERIC HISTORICAL  02/12/2016   IR REMOVAL TUN ACCESS W/ PORT W/O FL MOD SED 02/12/2016 Steven Daft, MD [facility]    Family History  Problem Relation Age of Onset  . Pancreatic cancer Mother   . Cancer Mother   . Diabetes Paternal Grandmother   . Thyroid disease Paternal Grandmother     Allergies  Allergen Reactions  . Lisinopril Swelling    Swelling of lower lip while on lisinopril  . Penicillins Hives and Swelling    REACTION: rash and swelling  . Levaquin [Levofloxacin In D5w] Rash  .  Tessalon [Benzonatate] Rash  . Aspirin Nausea Only    Current Outpatient Medications on File Prior to Visit  Medication Sig Dispense Refill  . albuterol (VENTOLIN HFA) 108 (90 Base) MCG/ACT inhaler Inhale two puffs every 4-6 hours if needed for cough or wheeze 18 g 1  . amLODipine (NORVASC) 10 MG tablet Take 1 tablet (10 mg total) by mouth daily. 90 tablet 3  . beclomethasone (QVAR REDIHALER) 80 MCG/ACT inhaler Inhale 2 puffs 2 (two) times daily into the lungs.    Marland Kitchen esomeprazole (NEXIUM) 40 MG capsule TAKE ONE CAPSULE BY MOUTH ONCE DAILY AS DIRECTED 30 capsule 0  . fexofenadine (ALLEGRA) 180 MG tablet Take 1 tablet (180 mg total) by mouth daily. 15 tablet 0  . fluticasone (FLOVENT HFA) 110 MCG/ACT inhaler Inhale 2 puffs into the lungs 2 (two) times daily. 1 Inhaler 5  . fluticasone-salmeterol (ADVAIR HFA) 230-21 MCG/ACT inhaler Inhale two puffs twice daily to prevent cough or wheeze 12 g 5  . glucose blood (TRUETEST TEST) test strip Use as instructed for 3 times daily testing of blood glucose 100 each 12  . hydrochlorothiazide (HYDRODIURIL) 25 MG tablet Take 1 tablet (25 mg total) by mouth daily. 90 tablet 3  . ibuprofen (ADVIL,MOTRIN) 200 MG tablet Take 400 mg every 6 (six) hours as needed by mouth for fever or mild pain.    Marland Kitchen LANCETS MICRO THIN 33G MISC USE TO TEST BLOOD SUGAR ONCE A DAY 100 each 2  . levothyroxine (SYNTHROID, LEVOTHROID) 150 MCG tablet TAKE 1 TABLET(150 MCG) BY MOUTH EVERY DAY 30 tablet 0  . montelukast (SINGULAIR) 10 MG tablet TAKE 1 TABLET BY MOUTH ONCE DAILY AS DIRECTED 30 tablet 3  . Multiple Vitamin (MULTIVITAMIN) tablet Take 1 tablet by mouth daily.    . potassium chloride SA (K-DUR,KLOR-CON) 20 MEQ tablet Take 1 tablet (20 mEq total) by mouth daily. 90 tablet 3  . TRIUMEQ 600-50-300 MG tablet TAKE 1 TABLET BY MOUTH EVERY DAY 30 tablet 0  . [DISCONTINUED] carvedilol (COREG) 12.5 MG tablet Take 1 tablet (12.5 mg total) by mouth 2 (two) times daily with a meal. 30 tablet  0  . [DISCONTINUED] lisinopril-hydrochlorothiazide (PRINZIDE,ZESTORETIC) 20-12.5 MG per tablet Take 1 tablet by mouth daily.     Current Facility-Administered Medications on File Prior to Visit  Medication Dose Route Frequency Provider Last Rate Last Dose  . omalizumab Arvid Right) injection 300 mg  300 mg Subcutaneous Q14 Days Kozlow, Donnamarie Poag, MD   300 mg at 03/23/17 1731    BP 112/78   Pulse 77   Temp 98.6 F (37 C) (Oral)   Ht 6\' 1"  (1.854 m)   Wt 292 lb 4 oz (132.6 kg)   SpO2 97%   BMI 38.56 kg/m    Objective:   Physical Exam  Constitutional: He is  oriented to person, place, and time. He appears well-nourished.  Neck: Neck supple.  Cardiovascular: Normal rate and regular rhythm.  Pulmonary/Chest: Effort normal and breath sounds normal. He has no wheezes. He has no rales.  Neurological: He is alert and oriented to person, place, and time.  Skin: Skin is warm and dry.  Psychiatric: He has a normal mood and affect.          Assessment & Plan:

## 2017-09-15 NOTE — Assessment & Plan Note (Signed)
Repeat BMP pending.

## 2017-09-15 NOTE — Addendum Note (Signed)
Addended by: Lendon Collar on: 09/15/2017 04:23 PM   Modules accepted: Orders

## 2017-09-15 NOTE — Assessment & Plan Note (Signed)
Stable in the office today, continue Amlodipine and HCTZ. Repeat BMP pending given hypokalemia in early January 2019. Continue oral potassium.

## 2017-09-15 NOTE — Assessment & Plan Note (Signed)
Repeat A1C pending. Foot exam today stable, recommended he see podiatry for toenail trimming. Eye exam due, he plans on scheduling.  Lipid panel and urine microalbumin pending.  Commended him on diet changes, continue. Follow up in 6 months.

## 2017-09-15 NOTE — Assessment & Plan Note (Signed)
Following with infectious disease, compliant to Triumeq.

## 2017-09-15 NOTE — Assessment & Plan Note (Signed)
Following with endocrinology 

## 2017-09-15 NOTE — Patient Instructions (Signed)
Return around 4:30 pm today for labs. Make sure not to eat anything else until then. You may have water and black coffee.  Start exercising. You should be getting 150 minutes of moderate intensity exercise weekly.  Continue to work on [employer].  Schedule your eye exam and toe nail trimming.  Please schedule a follow up appointment in 6 months.   It was a pleasure to see you today!

## 2017-09-15 NOTE — Assessment & Plan Note (Signed)
Good compliance to Qvar inhaler, using albuterol PRN. Continue Singulair and Allegra.

## 2017-09-16 LAB — BASIC METABOLIC PANEL
BUN: 14 mg/dL (ref 7–25)
CO2: 32 mmol/L (ref 20–32)
CREATININE: 1.24 mg/dL (ref 0.60–1.35)
Calcium: 9.9 mg/dL (ref 8.6–10.3)
Chloride: 98 mmol/L (ref 98–110)
Glucose, Bld: 88 mg/dL (ref 65–99)
Potassium: 3.4 mmol/L — ABNORMAL LOW (ref 3.5–5.3)
SODIUM: 140 mmol/L (ref 135–146)

## 2017-09-16 LAB — MICROALBUMIN / CREATININE URINE RATIO
CREATININE, URINE: 116 mg/dL (ref 20–320)
MICROALB UR: 2.6 mg/dL
MICROALB/CREAT RATIO: 22 ug/mg{creat} (ref <30)

## 2017-09-16 LAB — LIPID PANEL
CHOLESTEROL: 170 mg/dL (ref <200)
HDL: 41 mg/dL (ref >40)
LDL Cholesterol (Calc): 102 mg/dL (calc) — ABNORMAL HIGH
Non-HDL Cholesterol (Calc): 129 mg/dL (calc) (ref <130)
Total CHOL/HDL Ratio: 4.1 (calc) (ref <5.0)
Triglycerides: 168 mg/dL — ABNORMAL HIGH (ref <150)

## 2017-09-16 LAB — HEMOGLOBIN A1C
EAG (MMOL/L): 6.5 (calc)
HEMOGLOBIN A1C: 5.7 %{Hb} — AB (ref <5.7)
Mean Plasma Glucose: 117 (calc)

## 2017-09-18 ENCOUNTER — Other Ambulatory Visit: Payer: Self-pay | Admitting: Internal Medicine

## 2017-09-18 DIAGNOSIS — I10 Essential (primary) hypertension: Secondary | ICD-10-CM

## 2017-10-29 DIAGNOSIS — R609 Edema, unspecified: Secondary | ICD-10-CM | POA: Diagnosis not present

## 2017-10-29 DIAGNOSIS — M25571 Pain in right ankle and joints of right foot: Secondary | ICD-10-CM | POA: Diagnosis not present

## 2017-10-29 DIAGNOSIS — L03818 Cellulitis of other sites: Secondary | ICD-10-CM | POA: Diagnosis not present

## 2017-11-02 ENCOUNTER — Ambulatory Visit (INDEPENDENT_AMBULATORY_CARE_PROVIDER_SITE_OTHER): Payer: PPO | Admitting: Primary Care

## 2017-11-02 ENCOUNTER — Encounter: Payer: Self-pay | Admitting: Primary Care

## 2017-11-02 VITALS — BP 124/80 | HR 75 | Temp 98.4°F | Ht 73.0 in | Wt 293.2 lb

## 2017-11-02 DIAGNOSIS — L03115 Cellulitis of right lower limb: Secondary | ICD-10-CM | POA: Diagnosis not present

## 2017-11-02 DIAGNOSIS — M25571 Pain in right ankle and joints of right foot: Secondary | ICD-10-CM | POA: Diagnosis not present

## 2017-11-02 NOTE — Patient Instructions (Signed)
Continue Ciprofloxacin antibiotics for infection. Avoid use of hydrocodone if possible.  Elevate and ice your ankle.  Please notify me if your symptoms do not continue to improve.   It was a pleasure to see you today!

## 2017-11-02 NOTE — Progress Notes (Signed)
Subjective:    Patient ID: Steven [name], male    DOB: 03-31-1981, 37 y.o.   MRN: [id number]  HPI  Mr. Ferrall is a 37 year old male who presents today for Urgent Care follow up.  He presented to [facility] on [address] in [place] on 10/29/17 with a chief complaint of foot pain. His pain was located to the right ankle which began on 10/28/17. He underwent xray which was negative for fracture or obvious gout. He was diagnosed with cellulitis and prescribed with Ciprofloxacin and Hydrocodone.   Since his visit to Urgent Care he's been compliant to his Ciprofloxacin, using Hydrocodone infrequently. He's noticed some improvement in pain and swelling. He's been wearing an orthotic boot that he had from a prior injury as it helps to reduce discomfort.   He denies injury/trauma, history of gout, constant use of right lower extremity. He also denies fevers, increased pain/swelling, erythema. He is needing excuse from work today through Monday next week. 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    Review of Systems  Constitutional: Negative for fever.  Musculoskeletal: Positive for arthralgias.  Skin: Positive for color change. Negative for wound.       Past Medical History:  Diagnosis Date  . Asthma   . Cancer ([place])   . Diabetes mellitus without complication ([place])   . HIV positive ([place]) 03/23/09   Genotype Y181C  . HTN (hypertension)   . Kaposi's sarcoma   . Syphilis 03/23/09-   1:2     Social History   Socioeconomic History  . Marital status: Single    Spouse name: Not on file  . Number of children: Not on file  . Years of education: Not on file  . Highest education level: Not on file  Occupational History  . Not on file  Social Needs  . Financial resource strain: Not on file  . Food insecurity:    Worry: Not on file    Inability: Not on file  . Transportation needs:    Medical: Not on file    Non-medical: Not on file    Tobacco Use  . Smoking status: Never Smoker  . Smokeless tobacco: Never Used  Substance and Sexual Activity  . Alcohol use: Yes    Alcohol/week: 0.0 oz    Comment: socially - less now  . Drug use: No  . Sexual activity: Not Currently    Partners: Male    Birth control/protection: Condom    Comment: pt. declined condoms  Lifestyle  . Physical activity:    Days per week: Not on file    Minutes per session: Not on file  . Stress: Not on file  Relationships  . Social connections:    Talks on phone: Not on file    Gets together: Not on file    Attends religious service: Not on file    Active member of club or organization: Not on file    Attends meetings of clubs or organizations: Not on file    Relationship status: Not on file  . Intimate partner violence:    Fear of current or ex partner: Not on file    Emotionally abused: Not on file    Physically abused: Not on file    Forced sexual activity: Not on file  Other Topics Concern  . Not on file  Social History Narrative   Single.    No children.   Works in Barista  Service   Enjoys DJ, traveling.     Past Surgical History:  Procedure Laterality Date  . BRAIN SURGERY    . IR GENERIC HISTORICAL  02/12/2016   IR REMOVAL TUN ACCESS W/ PORT W/O FL MOD SED 02/12/2016 [name], MD [facility]    Family History  Problem Relation Age of Onset  . Pancreatic cancer Mother   . Cancer Mother   . Diabetes Paternal Grandmother   . Thyroid disease Paternal Grandmother     Allergies  Allergen Reactions  . Lisinopril Swelling    Swelling of lower lip while on lisinopril  . Penicillins Hives and Swelling    REACTION: rash and swelling  . Levaquin [Levofloxacin In D5w] Rash  . Tessalon [Benzonatate] Rash  . Aspirin Nausea Only    Current Outpatient Medications on File Prior to Visit  Medication Sig Dispense Refill  . albuterol (VENTOLIN HFA) 108 (90 Base) MCG/ACT inhaler Inhale two puffs every 4-6 hours if needed for cough  or wheeze 18 g 1  . amLODipine (NORVASC) 10 MG tablet Take 1 tablet (10 mg total) by mouth daily. 90 tablet 3  . beclomethasone (QVAR REDIHALER) 80 MCG/ACT inhaler Inhale 2 puffs 2 (two) times daily into the lungs.    . ciprofloxacin (CIPRO) 500 MG tablet TK 1 T PO  BID  0  . esomeprazole (NEXIUM) 40 MG capsule TAKE ONE CAPSULE BY MOUTH ONCE DAILY AS DIRECTED 30 capsule 0  . fexofenadine (ALLEGRA) 180 MG tablet Take 1 tablet (180 mg total) by mouth daily. 15 tablet 0  . fluticasone (FLOVENT HFA) 110 MCG/ACT inhaler Inhale 2 puffs into the lungs 2 (two) times daily. 1 Inhaler 5  . fluticasone-salmeterol (ADVAIR HFA) 230-21 MCG/ACT inhaler Inhale two puffs twice daily to prevent cough or wheeze 12 g 5  . glucose blood (TRUETEST TEST) test strip Use as instructed for 3 times daily testing of blood glucose 100 each 12  . hydrochlorothiazide (HYDRODIURIL) 25 MG tablet Take 1 tablet (25 mg total) by mouth daily. 90 tablet 3  . HYDROcodone-acetaminophen (NORCO/VICODIN) 5-325 MG tablet TK 1 T PO Q 8 H PRN  0  . ibuprofen (ADVIL,MOTRIN) 200 MG tablet Take 400 mg every 6 (six) hours as needed by mouth for fever or mild pain.    Marland Kitchen LANCETS MICRO THIN 33G MISC USE TO TEST BLOOD SUGAR ONCE A DAY 100 each 2  . levothyroxine (SYNTHROID, LEVOTHROID) 150 MCG tablet TAKE 1 TABLET(150 MCG) BY MOUTH EVERY DAY 30 tablet 0  . metFORMIN (GLUCOPHAGE-XR) 500 MG 24 hr tablet Take 2 tablets in the morning and 1 tablet in the evening. 270 tablet 3  . montelukast (SINGULAIR) 10 MG tablet TAKE 1 TABLET BY MOUTH ONCE DAILY AS DIRECTED 30 tablet 3  . Multiple Vitamin (MULTIVITAMIN) tablet Take 1 tablet by mouth daily.    . potassium chloride SA (K-DUR,KLOR-CON) 20 MEQ tablet Take 1 tablet (20 mEq total) by mouth daily. 90 tablet 3  . TRIUMEQ 600-50-300 MG tablet TAKE 1 TABLET BY MOUTH EVERY DAY 30 tablet 0  . [DISCONTINUED] carvedilol (COREG) 12.5 MG tablet Take 1 tablet (12.5 mg total) by mouth 2 (two) times daily with a meal.  30 tablet 0  . [DISCONTINUED] lisinopril-hydrochlorothiazide (PRINZIDE,ZESTORETIC) 20-12.5 MG per tablet Take 1 tablet by mouth daily.     Current Facility-Administered Medications on File Prior to Visit  Medication Dose Route Frequency Provider Last Rate Last Dose  . omalizumab Arvid Right) injection 300 mg  300 mg Subcutaneous Q14  Days Kozlow, Donnamarie Poag, MD   300 mg at 03/23/17 1731    BP 124/80   Pulse 75   Temp 98.4 F (36.9 C) (Oral)   Ht 6\' 1"  (1.854 m)   Wt 293 lb 4 oz (133 kg)   SpO2 98%   BMI 38.69 kg/m    Objective:   Physical Exam  Musculoskeletal:       Right ankle: He exhibits decreased range of motion and swelling. He exhibits no deformity and normal pulse.  Decrease in ROM with ankle rotation and dorsoflexion.   Skin: Skin is warm and dry.  Mild erythema to right ankle. Moderate edema.           Assessment & Plan:  Cellulitis:  Diagnosed at Urgent Care on 10/29/17. Compliant to Cipro. Exam today without evidence of entry wounds, deformity. Continue Cipro as he's noticed improvement. Continue to wear orthotic boot for comfort. Ice/elevate. Work note provided to return April 30th.   Follow up PRN. Pleas [name], NP

## 2017-11-24 ENCOUNTER — Other Ambulatory Visit (INDEPENDENT_AMBULATORY_CARE_PROVIDER_SITE_OTHER): Payer: PPO

## 2017-11-24 DIAGNOSIS — E890 Postprocedural hypothyroidism: Secondary | ICD-10-CM | POA: Diagnosis not present

## 2017-11-24 LAB — TSH: TSH: 2.2 u[IU]/mL (ref 0.35–4.50)

## 2017-11-24 LAB — T4, FREE: Free T4: 1.2 ng/dL (ref 0.60–1.60)

## 2017-11-28 ENCOUNTER — Ambulatory Visit (HOSPITAL_COMMUNITY)
Admission: EM | Admit: 2017-11-28 | Discharge: 2017-11-28 | Disposition: A | Discharge status: Home / Self Care | Payer: PPO | Attending: Family Medicine | Admitting: Family Medicine

## 2017-11-28 ENCOUNTER — Encounter (HOSPITAL_COMMUNITY): Payer: Self-pay | Admitting: Emergency Medicine

## 2017-11-28 DIAGNOSIS — T7840XA Allergy, unspecified, initial encounter: Secondary | ICD-10-CM

## 2017-11-28 DIAGNOSIS — J0100 Acute maxillary sinusitis, unspecified: Secondary | ICD-10-CM

## 2017-11-28 MED ORDER — PREDNISONE 10 MG (21) PO TBPK: ORAL_TABLET | Freq: Every day | ORAL | 0 refills | Status: DC

## 2017-11-28 MED ORDER — AZITHROMYCIN 250 MG PO TABS: 250.0000 mg | ORAL_TABLET | Freq: Every day | ORAL | 0 refills | Status: DC

## 2017-11-28 NOTE — ED Triage Notes (Signed)
Pt reports oral swelling and swelling around his eyes that started last night.  He states he has an Epi pen that he hasn't had to use.  He has been taking Allegra and Benadryl to help with the swelling.

## 2017-11-28 NOTE — ED Provider Notes (Signed)
[facility]   [id number] 11/28/17 Arrival Time: 1062  ASSESSMENT & PLAN:  1. Allergic reaction, initial encounter   2. Acute non-recurrent maxillary sinusitis     Meds ordered this encounter  Medications  . azithromycin (ZITHROMAX) 250 MG tablet    Sig: Take 1 tablet (250 mg total) by mouth daily. Take first 2 tablets together, then 1 every day until finished.    Dispense:  6 tablet    Refill:  0  . predniSONE (STERAPRED UNI-PAK 21 TAB) 10 MG (21) TBPK tablet    Sig: Take by mouth daily. Take as directed.    Dispense:  21 tablet    Refill:  0   Discussed typical duration of symptoms. OTC symptom care as needed. Ensure adequate fluid intake and rest. May f/u with PCP or here as needed.  Reviewed expectations re: course of current medical issues. Questions answered. Outlined signs and symptoms indicating need for more acute intervention. Patient verbalized understanding. After Visit Summary given.   SUBJECTIVE: History from: patient.  Brylee Mcgreal is a 37 y.o. male who presents with complaint of nasal congestion, post-nasal drainage. Onset abrupt, approximately 1-2 weeks ago. Overall fatigued. Sinus pressure bothering him the most. SOB: none. Wheezing: none. Fever: no. Overall normal PO intake without n/v. Sick contacts: no. OTC treatment: cold medicines without relief.  Also h/o "allergic reaction in my mouth" with unknown trigger. Questions seasonal allergy component. Over the past day or two reports tingling in mouth and of tongue. No swallowing or respiratory difficulties.    Social History   Tobacco Use  Smoking Status Never Smoker  Smokeless Tobacco Never Used    ROS: As per HPI.   OBJECTIVE:  Vitals:   11/28/17 1429  BP: 130/90  Pulse: 88  Temp: 98.7 F (37.1 C)  TempSrc: Oral  SpO2: 97%     General appearance: alert; appears fatigued HEENT: nasal congestion; clear runny nose; throat irritation secondary to post-nasal drainage;  bilateral maxillary sinus tenderness Neck: supple without LAD Lungs: unlabored respirations, symmetrical air entry; cough: absent; no respiratory distress Skin: warm and dry; slight irritation over edges of mouth Psychological: alert and cooperative; normal mood and affect    Allergies  Allergen Reactions  . Lisinopril Swelling    Swelling of lower lip while on lisinopril  . Penicillins Hives and Swelling    REACTION: rash and swelling  . Levaquin [Levofloxacin In D5w] Rash  . Tessalon [Benzonatate] Rash  . Aspirin Nausea Only    Past Medical History:  Diagnosis Date  . Asthma   . Cancer ([place])   . Diabetes mellitus without complication ([place])   . HIV positive ([place]) 03/23/09   Genotype Y181C  . HTN (hypertension)   . Kaposi's sarcoma   . Syphilis 03/23/09-   1:2   Family History  Problem Relation Age of Onset  . Pancreatic cancer Mother   . Cancer Mother   . Diabetes Paternal Grandmother   . Thyroid disease Paternal Grandmother    Social History   Socioeconomic History  . Marital status: Single    Spouse name: Not on file  . Number of children: Not on file  . Years of education: Not on file  . Highest education level: Not on file  Occupational History  . Not on file  Social Needs  . Financial resource strain: Not on file  . Food insecurity:    Worry: Not on file    Inability: Not on file  . Transportation needs:  Medical: Not on file    Non-medical: Not on file  Tobacco Use  . Smoking status: Never Smoker  . Smokeless tobacco: Never Used  Substance and Sexual Activity  . Alcohol use: Yes    Alcohol/week: 0.0 oz    Comment: socially - less now  . Drug use: No  . Sexual activity: Not Currently    Partners: Male    Birth control/protection: Condom    Comment: pt. declined condoms  Lifestyle  . Physical activity:    Days per week: Not on file    Minutes per session: Not on file  . Stress: Not on file  Relationships  . Social connections:    Talks  on phone: Not on file    Gets together: Not on file    Attends religious service: Not on file    Active member of club or organization: Not on file    Attends meetings of clubs or organizations: Not on file    Relationship status: Not on file  . Intimate partner violence:    Fear of current or ex partner: Not on file    Emotionally abused: Not on file    Physically abused: Not on file    Forced sexual activity: Not on file  Other Topics Concern  . Not on file  Social History Narrative   Single.    No children.   Works in Therapist, art   Enjoys DJ, traveling.            Vanessa Kick, MD 12/06/17 [phone]

## 2017-11-28 NOTE — Discharge Instructions (Signed)
You may use Benadryl as needed in addition to the medications prescribed.

## 2017-11-29 ENCOUNTER — Encounter: Payer: Self-pay | Admitting: Endocrinology

## 2017-11-29 ENCOUNTER — Ambulatory Visit (INDEPENDENT_AMBULATORY_CARE_PROVIDER_SITE_OTHER): Payer: PPO | Admitting: Endocrinology

## 2017-11-29 VITALS — BP 128/82 | HR 100 | Ht 73.0 in | Wt 292.4 lb

## 2017-11-29 DIAGNOSIS — E890 Postprocedural hypothyroidism: Secondary | ICD-10-CM | POA: Diagnosis not present

## 2017-11-29 MED ORDER — LEVOTHYROXINE SODIUM 150 MCG PO TABS: ORAL_TABLET | ORAL | 2 refills | Status: DC

## 2017-11-29 NOTE — Progress Notes (Addendum)
Patient ID: Steven [name], male   DOB: 1980-10-25, 37 y.o.   MRN: [id number]   Reason for Appointment:  Hypothyroidism, follow-up visit    History of Present Illness:   HYPOTHYROIDISM  was first diagnosed in ? Early 2013 He previously had hyperthyroidism which was treated with radioactive iodine in 05/2011  He had been taking Synthroid 200 mcg daily initially and it was then reduced to 175 mcg Because of persistently low TSH the dose was again reduced in 08/2014 down to 150 g  He has been since 5/18 taking 7-1/2 tablets a week of the 150 mcg levothyroxine His TSH at that time was 4.6  Most recently he feels fairly good and has been more active overall No cold intolerance  He takes his medication consistently daily before breakfast, usually an hour before eating     Wt Readings from Last 3 Encounters:  11/29/17 292 lb 6.4 oz (132.6 kg)  11/02/17 293 lb 4 oz (133 kg)  09/15/17 292 lb 4 oz (132.6 kg)    Labs:  Lab Results  Component Value Date   TSH 2.20 11/24/2017   TSH 4.62 (H) 11/29/2016   TSH 2.43 12/22/2015   FREET4 1.20 11/24/2017   FREET4 1.25 11/29/2016   FREET4 1.36 12/22/2015    OTHER active problems including diabetes are discussed in review of systems:    Past Medical History:  Diagnosis Date  . Asthma   . Cancer ([place])   . Diabetes mellitus without complication ([place])   . 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 HTN (hypertension)   . Kaposi's sarcoma   . Syphilis 03/23/09-   1:2    Past Surgical History:  Procedure Laterality Date  . BRAIN SURGERY    . IR GENERIC HISTORICAL  02/12/2016   IR REMOVAL TUN ACCESS W/ PORT W/O FL MOD SED 02/12/2016 [name], MD [facility]    Family History  Problem Relation Age of Onset  . Pancreatic cancer Mother   . Cancer Mother   . Diabetes Paternal Grandmother   . Thyroid disease Paternal Grandmother     Social History:  reports that he has never smoked. He has never used smokeless  tobacco. He reports that he drinks alcohol. He reports that he does not use drugs.  Allergies:  Allergies  Allergen Reactions  . Lisinopril Swelling    Swelling of lower lip while on lisinopril  . Penicillins Hives and Swelling    REACTION: rash and swelling  . Levaquin [Levofloxacin In D5w] Rash  . Tessalon [Benzonatate] Rash  . Aspirin Nausea Only    Allergies as of 11/29/2017      Reactions   Lisinopril Swelling   Swelling of lower lip while on lisinopril   Penicillins Hives, Swelling   REACTION: rash and swelling   Levaquin [levofloxacin In D5w] Rash   Tessalon [benzonatate] Rash   Aspirin Nausea Only      Medication List        Accurate as of 11/29/17  4:00 PM. Always use your most recent med list.          albuterol 108 (90 Base) MCG/ACT inhaler Commonly known as:  VENTOLIN HFA Inhale two puffs every 4-6 hours if needed for cough or wheeze   amLODipine 10 MG tablet Commonly known as:  NORVASC Take 1 tablet (10 mg total) by mouth daily.   azithromycin 250 MG tablet Commonly known as:  ZITHROMAX Take 1 tablet (250 mg total) by mouth  daily. Take first 2 tablets together, then 1 every day until finished.   esomeprazole 40 MG capsule Commonly known as:  NEXIUM TAKE ONE CAPSULE BY MOUTH ONCE DAILY AS DIRECTED   fexofenadine 180 MG tablet Commonly known as:  ALLEGRA Take 1 tablet (180 mg total) by mouth daily.   fluticasone 110 MCG/ACT inhaler Commonly known as:  FLOVENT HFA Inhale 2 puffs into the lungs 2 (two) times daily.   fluticasone-salmeterol 230-21 MCG/ACT inhaler Commonly known as:  ADVAIR HFA Inhale two puffs twice daily to prevent cough or wheeze   glucose blood test strip Commonly known as:  TRUETEST TEST Use as instructed for 3 times daily testing of blood glucose   hydrochlorothiazide 25 MG tablet Commonly known as:  HYDRODIURIL Take 1 tablet (25 mg total) by mouth daily.   ibuprofen 200 MG tablet Commonly known as:  ADVIL,MOTRIN Take  400 mg every 6 (six) hours as needed by mouth for fever or mild pain.   LANCETS MICRO THIN 33G Misc USE TO TEST BLOOD SUGAR ONCE A DAY   levothyroxine 150 MCG tablet Commonly known as:  SYNTHROID, LEVOTHROID TAKE 1 TABLET(150 MCG) BY MOUTH EVERY DAY   metFORMIN 500 MG 24 hr tablet Commonly known as:  GLUCOPHAGE-XR Take 2 tablets in the morning and 1 tablet in the evening.   montelukast 10 MG tablet Commonly known as:  SINGULAIR TAKE 1 TABLET BY MOUTH ONCE DAILY AS DIRECTED   multivitamin tablet Take 1 tablet by mouth daily.   potassium chloride SA 20 MEQ tablet Commonly known as:  K-DUR,KLOR-CON Take 1 tablet (20 mEq total) by mouth daily.   predniSONE 10 MG (21) Tbpk tablet Commonly known as:  STERAPRED UNI-PAK 21 TAB Take by mouth daily. Take as directed.   QVAR REDIHALER 80 MCG/ACT inhaler Generic drug:  beclomethasone Inhale 2 puffs 2 (two) times daily into the lungs.   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   Elayne Snare 11/29/2017, 4:00 PM

## 2017-12-13 ENCOUNTER — Other Ambulatory Visit: Payer: Self-pay | Admitting: Infectious Diseases

## 2017-12-13 DIAGNOSIS — I10 Essential (primary) hypertension: Secondary | ICD-10-CM

## 2017-12-17 ENCOUNTER — Other Ambulatory Visit: Payer: Self-pay | Admitting: Primary Care

## 2017-12-21 ENCOUNTER — Other Ambulatory Visit: Payer: Self-pay | Admitting: Endocrinology

## 2018-01-02 ENCOUNTER — Other Ambulatory Visit: Payer: Self-pay | Admitting: Endocrinology

## 2018-01-02 ENCOUNTER — Other Ambulatory Visit: Payer: Self-pay | Admitting: Infectious Diseases

## 2018-01-02 DIAGNOSIS — B20 Human immunodeficiency virus [HIV] disease: Secondary | ICD-10-CM

## 2018-01-30 ENCOUNTER — Ambulatory Visit (HOSPITAL_COMMUNITY)
Admission: EM | Admit: 2018-01-30 | Discharge: 2018-01-30 | Disposition: A | Discharge status: Home / Self Care | Payer: PPO | Attending: Family Medicine | Admitting: Family Medicine

## 2018-01-30 ENCOUNTER — Other Ambulatory Visit: Payer: Self-pay

## 2018-01-30 ENCOUNTER — Encounter (HOSPITAL_COMMUNITY): Payer: Self-pay | Admitting: Emergency Medicine

## 2018-01-30 DIAGNOSIS — T7840XA Allergy, unspecified, initial encounter: Secondary | ICD-10-CM | POA: Diagnosis not present

## 2018-01-30 DIAGNOSIS — T783XXA Angioneurotic edema, initial encounter: Secondary | ICD-10-CM

## 2018-01-30 MED ORDER — OLOPATADINE HCL 0.1 % OP SOLN: 1.0000 [drp] | Freq: Two times a day (BID) | OPHTHALMIC | 0 refills | Status: DC

## 2018-01-30 MED ORDER — METHYLPREDNISOLONE SODIUM SUCC 125 MG IJ SOLR
INTRAMUSCULAR | Status: AC
  Filled 2018-01-30: qty 2

## 2018-01-30 MED ORDER — METHYLPREDNISOLONE SODIUM SUCC 125 MG IJ SOLR
80.0000 mg | Freq: Once | INTRAMUSCULAR | Status: AC
  Administered 2018-01-30: 80 mg via INTRAMUSCULAR

## 2018-01-30 NOTE — Discharge Instructions (Signed)
We gave you a shot of Solu-Medrol today, this is a steroid to help stop your allergic reaction Please continue to monitor your symptoms, please go to emergency room if developing difficulty breathing, shortness of breath, worsening swelling  Continue to use antihistamines at home, may use Benadryl at nighttime or take daily Zyrtec or Claritin. May also apply cold compress Olopatadine eyedrops for her eyes to help with itching

## 2018-01-30 NOTE — ED Triage Notes (Signed)
The patient presented to the [facility] with a complaint of swelling around his mouth and eyes that started this evening after eating. The patient reported taking his reflux medication and a benadryl after.

## 2018-01-30 NOTE — ED Provider Notes (Signed)
[place]    CSN: [id number] Arrival date & time: 01/30/18  1948     History   Chief Complaint Chief Complaint  Patient presents with  . Angioedema    HPI Estill Llerena is a 37 y.o. male history of asthma, cancer, DM, HIV positive, hypertension presenting today for evaluation of allergic reaction.  Patient states that approximately 2 hours ago he began to develop swelling in his eyes as well as around his lips.  He has had an itchy sensation around his lips, but denies any throat swelling, shortness of breath or difficulty breathing.  States that he ate a beef casserole which is typical for him as well as took his Prilosec.  Symptoms developed 30 to 40 minutes after taking the Prilosec.  He has been on the Prilosec for a while and has not previously had issues.  He also notes his eyes have been itching as well as become red.  He has an EpiPen for previous significant allergic reactions and is concerned about this progressing.   HPI  Past Medical History:  Diagnosis Date  . Asthma   . Cancer ([place])   . Diabetes mellitus without complication ([place])   . 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 HTN (hypertension)   . Kaposi's sarcoma   . Syphilis 03/23/09-   1:2    Patient Active Problem List   Diagnosis Date Noted  . GERD (gastroesophageal reflux disease) 03/28/2017  . Asthma 03/28/2017  . Type 2 diabetes mellitus ([place]) 03/25/2015  . CKD (chronic kidney disease) stage 3, GFR 30-59 ml/min ([facility]) 03/23/2015  . Oral thrush 03/23/2015  . Essential hypertension 11/24/2014  . Post-nasal drip 02/05/2014  . 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 Arthralgia of elbow, right 07/29/2013  . Colitis 05/27/2011  . KS (Kaposi's sarcoma) ([place]) 04/11/2011  . 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 Human immunodeficiency virus (HIV) disease ([place]) 04/10/2009  . Lymphedema 04/10/2009  . SYPHILIS 04/06/2009    Past Surgical  History:  Procedure Laterality Date  . BRAIN SURGERY    . IR GENERIC HISTORICAL  02/12/2016   IR REMOVAL TUN ACCESS W/ PORT W/O FL MOD SED 02/12/2016 [name], MD [facility]       Home Medications    Prior to Admission medications   Medication Sig Start Date End Date Taking? Authorizing Provider  albuterol (VENTOLIN HFA) 108 (90 Base) MCG/ACT inhaler Inhale two puffs every 4-6 hours if needed for cough or wheeze 02/12/16   Kozlow, Donnamarie Poag, MD  amLODipine (NORVASC) 10 MG tablet Take 1 tablet (10 mg total) by mouth daily. 03/28/17   Pleas [name], NP  beclomethasone (QVAR REDIHALER) 80 MCG/ACT inhaler Inhale 2 puffs 2 (two) times daily into the lungs. 08/30/16   [provider]  esomeprazole (NEXIUM) 40 MG capsule TAKE ONE CAPSULE BY MOUTH ONCE DAILY AS DIRECTED 02/06/17   Kozlow, Donnamarie Poag, MD  fexofenadine (ALLEGRA) 180 MG tablet Take 1 tablet (180 mg total) by mouth daily. 05/03/12   Harden Mo, MD  fluticasone (FLOVENT HFA) 110 MCG/ACT inhaler Inhale 2 puffs into the lungs 2 (two) times daily. 09/23/16   Kozlow, Donnamarie Poag, MD  fluticasone-salmeterol (ADVAIR HFA) [phone] MCG/ACT inhaler Inhale two puffs twice daily to prevent cough or wheeze 02/12/16   Kozlow, Donnamarie Poag, MD  hydrochlorothiazide (HYDRODIURIL) 25 MG tablet Take 1 tablet (25 mg total) by mouth daily. 03/28/17   Pleas [name], NP  ibuprofen (ADVIL,MOTRIN)  200 MG tablet Take 400 mg every 6 (six) hours as needed by mouth for fever or mild pain.    [provider]  LANCETS MICRO THIN 33G MISC USE TO TEST BLOOD SUGAR ONCE A DAY 08/21/17   Tresa Garter, MD  levothyroxine (SYNTHROID, LEVOTHROID) 150 MCG tablet Take 1 daily but extra 1/2 tab once weekly 11/29/17   Elayne Snare, MD  levothyroxine (SYNTHROID, LEVOTHROID) 150 MCG tablet TAKE 1 TABLET BY MOUTH DAILY 01/02/18   Elayne Snare, MD  metFORMIN (GLUCOPHAGE-XR) 500 MG 24 hr tablet Take 2 tablets in the morning and 1 tablet in the evening. 09/15/17   Pleas [name], NP  montelukast (SINGULAIR) 10 MG tablet TAKE 1 TABLET BY MOUTH ONCE DAILY AS DIRECTED 09/22/16   Kozlow, Donnamarie Poag, MD  Multiple Vitamin (MULTIVITAMIN) tablet Take 1 tablet by mouth daily.    [provider]  olopatadine (PATANOL) 0.1 % ophthalmic solution Place 1 drop into both eyes 2 (two) times daily. 01/30/18   Dexton Zwilling C, PA-C  potassium chloride SA (K-DUR,KLOR-CON) 20 MEQ tablet Take 1 tablet (20 mEq total) by mouth daily. 11/30/16   Campbell Riches, MD  predniSONE (STERAPRED UNI-PAK 21 TAB) 10 MG (21) TBPK tablet Take by mouth daily. Take as directed. 11/28/17   Vanessa Kick, MD  TRIUMEQ 600-50-300 MG tablet TAKE 1 TABLET BY MOUTH EVERY DAY 01/02/18   Campbell Riches, MD  TRUE METRIX BLOOD GLUCOSE TEST test strip USE AS DIRECTED FOR THREE TIMES DAILY TESTING OF BLOOD GLUCOSE 12/18/17   Pleas [name], NP    Family History Family History  Problem Relation Age of Onset  . Pancreatic cancer Mother   . Cancer Mother   . Diabetes Paternal Grandmother   . Thyroid disease Paternal Grandmother     Social History Social History   Tobacco Use  . Smoking status: Never Smoker  . Smokeless tobacco: Never Used  Substance Use Topics  . Alcohol use: Yes    Alcohol/week: 0.0 oz    Comment: socially - less now  . Drug use: No     Allergies   Lisinopril; Penicillins; Levaquin [levofloxacin in d5w]; Tessalon [benzonatate]; and Aspirin   Review of Systems Review of Systems  Constitutional: Negative for fatigue and fever.  HENT: Positive for facial swelling. Negative for sore throat and trouble swallowing.   Eyes: Positive for redness and itching. Negative for visual disturbance.  Respiratory: Negative for cough, chest tightness and shortness of breath.   Cardiovascular: Negative for chest pain and leg swelling.  Gastrointestinal: Negative for nausea and vomiting.  Musculoskeletal: Negative for arthralgias and myalgias.  Skin: Negative for color change,  rash and wound.  Neurological: Negative for dizziness, syncope, weakness, light-headedness and headaches.     Physical Exam Triage Vital Signs ED Triage Vitals [01/30/18 2000]  Enc Vitals Group     BP 139/86     Pulse Rate 92     Resp 20     Temp 98.1 F (36.7 C)     Temp Source Oral     SpO2 98 %     Weight      Height      Head Circumference      Peak Flow      Pain Score 0     Pain Loc      Pain Edu?      Excl. in [place]?    No data found.  Updated Vital Signs BP 139/86 (BP Location: Left  Arm)   Pulse 92   Temp 98.1 F (36.7 C) (Oral)   Resp 20   SpO2 98%   Visual Acuity Right Eye Distance:   Left Eye Distance:   Bilateral Distance:    Right Eye Near:   Left Eye Near:    Bilateral Near:     Physical Exam  Constitutional: He appears well-developed and well-nourished.  HENT:  Head: Normocephalic and atraumatic.  Oral mucosa pink and moist, no tonsillar enlargement or exudate. Posterior pharynx patent and nonerythematous, no uvula deviation or swelling. Normal phonation.  Mild erythema surrounding lips with mild swelling  Eyes: Pupils are equal, round, and reactive to light. Conjunctivae and EOM are normal.  Conjunctive a erythematous bilaterally, mild upper eyelid swelling bilaterally  Neck: Neck supple.  Cardiovascular: Normal rate and regular rhythm.  No murmur heard. Pulmonary/Chest: Effort normal and breath sounds normal. No respiratory distress.  Breathing comfortably at rest, CTABL, no wheezing, rales or other adventitious sounds auscultated  Abdominal: Soft. There is no tenderness.  Musculoskeletal: He exhibits no edema.  Neurological: He is alert.  Skin: Skin is warm and dry.  Psychiatric: He has a normal mood and affect.  Nursing note and vitals reviewed.    UC Treatments / Results  Labs (all labs ordered are listed, but only abnormal results are displayed) Labs Reviewed - No data to display  EKG None  Radiology No results  found.  Procedures Procedures (including critical care time)  Medications Ordered in UC Medications  methylPREDNISolone sodium succinate (SOLU-MEDROL) 125 mg/2 mL injection 80 mg (80 mg Intramuscular Given 01/30/18 2034)    Initial Impression / Assessment and Plan / UC Course  I have reviewed the triage vital signs and the nursing notes.  Pertinent labs & imaging results that were available during my care of the patient were reviewed by me and considered in my medical decision making (see chart for details).     Patient appears to have allergic reaction, symptoms appear to have plateaued.  Will provide 80 mg Solu-Medrol, given 1 dose of steroids, less immune suppressive, patient's last HIV levels were just above detectable, but he states the doctors told him that his levels are undetectable.  Will have use antihistamines for further management at home and continue to monitor symptoms.  Go to ED if developing shortness of breath, difficulty breathing or worsening swelling.  Olopatadine eyedrops for eye itching and redness.Discussed strict return precautions. Patient verbalized understanding and is agreeable with plan.  Final Clinical Impressions(s) / UC Diagnoses   Final diagnoses:  Allergic reaction, initial encounter     Discharge Instructions     We gave you a shot of Solu-Medrol today, this is a steroid to help stop your allergic reaction Please continue to monitor your symptoms, please go to emergency room if developing difficulty breathing, shortness of breath, worsening swelling  Continue to use antihistamines at home, may use Benadryl at nighttime or take daily Zyrtec or Claritin. May also apply cold compress Olopatadine eyedrops for her eyes to help with itching   ED Prescriptions    Medication Sig Dispense Auth. Provider   olopatadine (PATANOL) 0.1 % ophthalmic solution Place 1 drop into both eyes 2 (two) times daily. 5 mL Hero Kulish C, PA-C     Controlled  Substance Prescriptions [place] Controlled Substance Registry consulted? Not Applicable   Janith Lima, [place] 01/30/18 2042

## 2018-02-06 ENCOUNTER — Other Ambulatory Visit: Payer: Self-pay | Admitting: Infectious Diseases

## 2018-02-06 DIAGNOSIS — B20 Human immunodeficiency virus [HIV] disease: Secondary | ICD-10-CM

## 2018-02-08 ENCOUNTER — Ambulatory Visit: Payer: PPO

## 2018-02-08 ENCOUNTER — Encounter: Payer: Self-pay | Admitting: Allergy

## 2018-02-08 ENCOUNTER — Ambulatory Visit (INDEPENDENT_AMBULATORY_CARE_PROVIDER_SITE_OTHER): Payer: PPO | Admitting: Allergy

## 2018-02-08 VITALS — BP 112/68 | HR 92 | Resp 18 | Ht 73.0 in | Wt 276.0 lb

## 2018-02-08 DIAGNOSIS — J4540 Moderate persistent asthma, uncomplicated: Secondary | ICD-10-CM | POA: Diagnosis not present

## 2018-02-08 DIAGNOSIS — J3089 Other allergic rhinitis: Secondary | ICD-10-CM | POA: Diagnosis not present

## 2018-02-08 DIAGNOSIS — H1013 Acute atopic conjunctivitis, bilateral: Secondary | ICD-10-CM | POA: Diagnosis not present

## 2018-02-08 DIAGNOSIS — K219 Gastro-esophageal reflux disease without esophagitis: Secondary | ICD-10-CM | POA: Diagnosis not present

## 2018-02-08 DIAGNOSIS — Z91018 Allergy to other foods: Secondary | ICD-10-CM | POA: Diagnosis not present

## 2018-02-08 DIAGNOSIS — B20 Human immunodeficiency virus [HIV] disease: Secondary | ICD-10-CM | POA: Diagnosis not present

## 2018-02-08 MED ORDER — PAZEO 0.7 % OP SOLN: 1.0000 [drp] | Freq: Every day | OPHTHALMIC | 5 refills | Status: DC

## 2018-02-08 MED ORDER — FLUTICASONE PROPIONATE HFA 110 MCG/ACT IN AERO: 2.0000 | INHALATION_SPRAY | Freq: Two times a day (BID) | RESPIRATORY_TRACT | 5 refills | Status: DC

## 2018-02-08 MED ORDER — FLUTICASONE PROPIONATE 50 MCG/ACT NA SUSP: 2.0000 | Freq: Every day | NASAL | 5 refills | Status: DC

## 2018-02-08 MED ORDER — ALBUTEROL SULFATE HFA 108 (90 BASE) MCG/ACT IN AERS: INHALATION_SPRAY | RESPIRATORY_TRACT | 1 refills | Status: DC

## 2018-02-08 MED ORDER — EPINEPHRINE 0.3 MG/0.3ML IJ SOAJ: 0.3000 mg | Freq: Once | INTRAMUSCULAR | 2 refills | Status: AC

## 2018-02-08 MED ORDER — FLUTICASONE-SALMETEROL 230-21 MCG/ACT IN AERO: INHALATION_SPRAY | RESPIRATORY_TRACT | 5 refills | Status: DC

## 2018-02-08 MED ORDER — MONTELUKAST SODIUM 10 MG PO TABS: 10.0000 mg | ORAL_TABLET | Freq: Every day | ORAL | 5 refills | Status: DC

## 2018-02-08 NOTE — Patient Instructions (Addendum)
  1. Will resubmit approval for Xolair.  You will be contacted by our biologics nurse, Tammy, regarding approval and restarting every 2 week injections.    2. Continue Advair 230 2 inhalations twice a day  Asthma action plan: add Flovent 133mcg 2 puffs twice a day to your Advair during asthma flares/respiratory illnesses  3. Continue montelukast 10 mg daily  4. Continue Flonase 1-2 sprays each nostril one time per day for nasal drainage con  5. Continue Nexium 40 mg once a day if needed for reflux control  6. Have access to albuterol inhaler 2 puffs every 4-6 hours as needed for cough/wheeze/shortness of breath/chest tightness.  May use 15-20 minutes prior to activity.   Monitor frequency of use.    7. Continue fexofenadine/Allegra 180mg  one time per day if needed  8.  For itchy/watery/red eyes use [place] 1 drop each eye daily as needed  9. Continue avoidance of shellfish and medications you are allergic too.  Have access to self-injectable epinephrine Epipen 0.3mg  at all times.  Follow emergency action plan in case of allergic reaction.  10. Return to clinic in 4 months or sooner if needed

## 2018-02-08 NOTE — Progress Notes (Signed)
Follow-up Note  RE: Otis Portal MRN: [id number] DOB: 09-01-1980 Date of Office Visit: 02/08/2018   History of present illness: Steven [name] is a 37 y.o. male presenting today for follow-up of asthma, allergic rhinitis and reflux.  He was last seen in the office on 08/30/2016 by Dr. Neldon Mc.  She states since this time he has since changed jobs.  He denies any major health changes, surgeries or hospitalizations.  He states that the spring and summer are worse times of the year for him for his allergies and asthma.  He states he has required increased use of his albuterol about 3-4 times a week during spring and summer.  He states when he gets overheated he develops symptoms of chest tightness, shortness of breath, cough and wheeze.  The symptoms are resolved with the use of albuterol.  He does continue to take Advair 230-2 inhalations in the morning and also takes Qvar 80-2 puffs in the evening.  He is denies any nighttime awakenings.  He denies any ED or urgent care visits for asthma and denies any oral steroid needs for asthma.  He was on Xolair however his last injection was in September 2018.  He states he was better controlled when he was on Xolair and he would like to restart this therapy. With his allergies he reports itchy eyes watery eyes, nasal congestion and drainage sneezing.  He does take Singulair daily as well as uses Flonase and takes Claritin currently.  He does state that these medications help with his allergy symptoms. He continues on Nexium for reflux control. He states he does have a shellfish allergy as he develops hives with ingestion. He states he did have a reaction after using Alka-Seltzer as he did not know a contain aspirin and states after ingestion he developed mouth swelling as well as eye swelling and did require going to any ED.  He states he did use his EpiPen during this reaction.  Review of systems: Review of Systems  Constitutional: Negative for chills,  fever and malaise/fatigue.  HENT: Positive for congestion. Negative for ear discharge, ear pain, nosebleeds, sinus pain and sore throat.   Eyes: Negative for pain, discharge and redness.  Respiratory: Positive for cough, shortness of breath and wheezing. Negative for hemoptysis and sputum production.   Cardiovascular: Negative for chest pain.  Gastrointestinal: Negative for abdominal pain, constipation, diarrhea, heartburn, nausea and vomiting.  Musculoskeletal: Negative for joint pain.  Skin: Negative for itching and rash.  Neurological: Negative for headaches.    All other systems negative unless noted above in HPI  Past medical/social/surgical/family history have been reviewed and are unchanged unless specifically indicated below.  No changes  Medication List: Allergies as of 02/08/2018      Reactions   Lisinopril Swelling   Swelling of lower lip while on lisinopril   Penicillins Hives, Swelling   REACTION: rash and swelling   Levaquin [levofloxacin In D5w] Rash   Tessalon [benzonatate] Rash   Aspirin Nausea Only      Medication List        Accurate as of 02/08/18  5:38 PM. Always use your most recent med list.          albuterol 108 (90 Base) MCG/ACT inhaler Commonly known as:  VENTOLIN HFA Inhale two puffs every 4-6 hours if needed for cough or wheeze   amLODipine 10 MG tablet Commonly known as:  NORVASC Take 1 tablet (10 mg total) by mouth daily.   esomeprazole 40 MG capsule  Commonly known as:  NEXIUM TAKE ONE CAPSULE BY MOUTH ONCE DAILY AS DIRECTED   fexofenadine 180 MG tablet Commonly known as:  ALLEGRA Take 1 tablet (180 mg total) by mouth daily.   fluticasone-salmeterol 230-21 MCG/ACT inhaler Commonly known as:  ADVAIR HFA Inhale two puffs twice daily to prevent cough or wheeze   hydrochlorothiazide 25 MG tablet Commonly known as:  HYDRODIURIL Take 1 tablet (25 mg total) by mouth daily.   ibuprofen 200 MG tablet Commonly known as:   ADVIL,MOTRIN Take 400 mg every 6 (six) hours as needed by mouth for fever or mild pain.   LANCETS MICRO THIN 33G Misc USE TO TEST BLOOD SUGAR ONCE A DAY   levothyroxine 150 MCG tablet Commonly known as:  SYNTHROID, LEVOTHROID Take 1 daily but extra 1/2 tab once weekly   levothyroxine 150 MCG tablet Commonly known as:  SYNTHROID, LEVOTHROID TAKE 1 TABLET BY MOUTH DAILY   metFORMIN 500 MG 24 hr tablet Commonly known as:  GLUCOPHAGE-XR Take 2 tablets in the morning and 1 tablet in the evening.   montelukast 10 MG tablet Commonly known as:  SINGULAIR TAKE 1 TABLET BY MOUTH ONCE DAILY AS DIRECTED   multivitamin tablet Take 1 tablet by mouth daily.   potassium chloride SA 20 MEQ tablet Commonly known as:  K-DUR,KLOR-CON Take 1 tablet (20 mEq total) by mouth daily.   QVAR REDIHALER 80 MCG/ACT inhaler Generic drug:  beclomethasone Inhale 2 puffs 2 (two) times daily into the lungs.   TRIUMEQ [phone] MG tablet Generic drug:  abacavir-dolutegravir-lamiVUDine TAKE 1 TABLET BY MOUTH EVERY DAY   TRUE METRIX BLOOD GLUCOSE TEST test strip Generic drug:  glucose blood USE AS DIRECTED FOR THREE TIMES DAILY TESTING OF BLOOD GLUCOSE       Known medication allergies: Allergies  Allergen Reactions  . Lisinopril Swelling    Swelling of lower lip while on lisinopril  . Penicillins Hives and Swelling    REACTION: rash and swelling  . Levaquin [Levofloxacin In D5w] Rash  . Tessalon [Benzonatate] Rash  . Aspirin Nausea Only     Physical examination: Blood pressure 112/68, pulse 92, resp. rate 18, height 6\' 1"  (1.854 m), weight 276 lb (125.2 kg), SpO2 97 %.  General: Alert, interactive, in no acute distress. HEENT: PERRLA, TMs pearly gray, turbinates moderately edematous without discharge, post-pharynx non erythematous. Neck: Supple without lymphadenopathy. Lungs: Clear to auscultation without wheezing, rhonchi or rales. {no increased work of breathing. CV: Normal S1, S2  without murmurs. Abdomen: Nondistended, nontender. Skin: Warm and dry, without lesions or rashes. Extremities:  No clubbing, cyanosis or edema. Neuro:   Grossly intact.  Diagnositics/Labs:  Spirometry: FEV1: 2.08L 38%, FVC: 3.14L 81% he had a 38% improvement in FEV1 to 2.86 L or 72% after albuterol, FVC improved by 65% to 3.91 or 81%.  This is significant improvement.  Assessment and plan: Moderate persistent asthma   - not well controlled at this time   - Will resubmit approval for Xolair.  You will be contacted by our biologics nurse, Tammy, regarding approval and restarting every 2 week injections.    - Continue Advair 230 2 inhalations twice a day  ** Asthma action plan: add Flovent 119mcg 2 puffs twice a day to your Advair during asthma flares/respiratory illnesses  - Continue montelukast 10 mg daily  - Have access to albuterol inhaler 2 puffs every 4-6 hours as needed for cough/wheeze/shortness of breath/chest tightness.  May use 15-20 minutes prior to activity.   Monitor frequency  of use.   Allergic rhinitis with conjunctivitis  - Continue Flonase 1-2 sprays each nostril one time per day for nasal drainage con - Continue fexofenadine/Allegra 180mg  one time per day if needed - For itchy/watery/red eyes use Pazeo 1 drop each eye daily as needed  GERD  - Continue Nexium 40 mg once a day if needed for reflux control  Food allergy  - Continue avoidance of shellfish and medications you are allergic too.  Have access to self-injectable epinephrine Epipen 0.3mg  at all times.  Follow emergency action plan in case of allergic reaction.  Return to clinic in 4 months or sooner if needed  I appreciate the opportunity to take part in [place] care. Please do not hesitate to contact me with questions.  Sincerely,   Prudy Feeler, MD Allergy/Immunology [facility]

## 2018-02-09 ENCOUNTER — Ambulatory Visit (INDEPENDENT_AMBULATORY_CARE_PROVIDER_SITE_OTHER): Payer: PPO | Admitting: Podiatry

## 2018-02-09 ENCOUNTER — Encounter: Payer: Self-pay | Admitting: Infectious Diseases

## 2018-02-09 ENCOUNTER — Encounter: Payer: Self-pay | Admitting: Podiatry

## 2018-02-09 DIAGNOSIS — M79676 Pain in unspecified toe(s): Secondary | ICD-10-CM

## 2018-02-09 DIAGNOSIS — H05243 Constant exophthalmos, bilateral: Secondary | ICD-10-CM | POA: Diagnosis not present

## 2018-02-09 DIAGNOSIS — B351 Tinea unguium: Secondary | ICD-10-CM | POA: Diagnosis not present

## 2018-02-09 DIAGNOSIS — E119 Type 2 diabetes mellitus without complications: Secondary | ICD-10-CM

## 2018-02-09 DIAGNOSIS — B20 Human immunodeficiency virus [HIV] disease: Secondary | ICD-10-CM | POA: Insufficient documentation

## 2018-02-09 DIAGNOSIS — H10413 Chronic giant papillary conjunctivitis, bilateral: Secondary | ICD-10-CM | POA: Diagnosis not present

## 2018-02-09 LAB — HEPATIC FUNCTION PANEL
AG Ratio: 1.7 (calc) (ref 1.0–2.5)
ALT: 26 U/L (ref 9–46)
AST: 14 U/L (ref 10–40)
Albumin: 4.7 g/dL (ref 3.6–5.1)
Alkaline phosphatase (APISO): 55 U/L (ref 40–115)
BILIRUBIN INDIRECT: 0.7 mg/dL (ref 0.2–1.2)
Bilirubin, Direct: 0.2 mg/dL (ref 0.0–0.2)
Globulin: 2.7 g/dL (calc) (ref 1.9–3.7)
TOTAL PROTEIN: 7.4 g/dL (ref 6.1–8.1)
Total Bilirubin: 0.9 mg/dL (ref 0.2–1.2)

## 2018-02-09 NOTE — Progress Notes (Signed)
Complaint:  Visit Type: Patient returns to my office for continued preventative foot care services. Complaint: Patient states" my nails have grown long and thick and become painful to walk and wear shoes"  The patient presents for preventative foot care services. No changes to ROS.  Patient has not been seen for 7 months.  Podiatric Exam: Vascular: dorsalis pedis and posterior tibial pulses are palpable bilateral. Capillary return is immediate. Temperature gradient is WNL. Skin turgor WNL  Sensorium: Normal Semmes Weinstein monofilament test. Normal tactile sensation bilaterally. Nail Exam: Pt has thick disfigured discolored nails with subungual debris noted bilateral entire nail hallux through fifth toenails Ulcer Exam: There is no evidence of ulcer or pre-ulcerative changes or infection. Orthopedic Exam: Muscle tone and strength are WNL. No limitations in general ROM. No crepitus or effusions noted. Foot type and digits show no abnormalities. Bony prominences are unremarkable. Skin: No Porokeratosis. No infection or ulcers  Diagnosis:  Onychomycosis, , Pain in right toe, pain in left toes  Treatment & Plan Procedures and Treatment: Consent by patient was obtained for treatment procedures. The patient understood the discussion of treatment and procedures well. All questions were answered thoroughly reviewed. Debridement of mycotic and hypertrophic toenails, 1 through 5 bilateral and clearing of subungual debris. No ulceration, no infection noted.  Patient has positive fungal result of nails.  Patient desires to proceed with lamisil treatment.  Hepatic profile ordered. Return Visit-Office Procedure: Patient instructed to return to the office for a follow up visit 3 months for continued evaluation and treatment.    Gardiner Barefoot DPM

## 2018-02-12 ENCOUNTER — Other Ambulatory Visit: Payer: Self-pay | Admitting: Primary Care

## 2018-02-12 DIAGNOSIS — I10 Essential (primary) hypertension: Secondary | ICD-10-CM

## 2018-02-16 ENCOUNTER — Telehealth: Payer: Self-pay | Admitting: *Deleted

## 2018-02-16 NOTE — Telephone Encounter (Signed)
Called Mr. Aron Baba to inform of positive fungus nail results and treatments available, no answer, left VM to return call back.

## 2018-02-19 ENCOUNTER — Telehealth: Payer: Self-pay | Admitting: Podiatry

## 2018-02-19 ENCOUNTER — Other Ambulatory Visit: Payer: Self-pay

## 2018-02-19 ENCOUNTER — Encounter (HOSPITAL_COMMUNITY): Payer: Self-pay | Admitting: Emergency Medicine

## 2018-02-19 ENCOUNTER — Emergency Department (HOSPITAL_COMMUNITY)
Admission: EM | Admit: 2018-02-19 | Discharge: 2018-02-20 | Disposition: A | Discharge status: Home / Self Care | Payer: PPO | Attending: Emergency Medicine | Admitting: Emergency Medicine

## 2018-02-19 DIAGNOSIS — E1122 Type 2 diabetes mellitus with diabetic chronic kidney disease: Secondary | ICD-10-CM | POA: Insufficient documentation

## 2018-02-19 DIAGNOSIS — B20 Human immunodeficiency virus [HIV] disease: Secondary | ICD-10-CM | POA: Diagnosis not present

## 2018-02-19 DIAGNOSIS — Z79899 Other long term (current) drug therapy: Secondary | ICD-10-CM | POA: Insufficient documentation

## 2018-02-19 DIAGNOSIS — T783XXA Angioneurotic edema, initial encounter: Secondary | ICD-10-CM | POA: Insufficient documentation

## 2018-02-19 DIAGNOSIS — N183 Chronic kidney disease, stage 3 (moderate): Secondary | ICD-10-CM | POA: Insufficient documentation

## 2018-02-19 DIAGNOSIS — I129 Hypertensive chronic kidney disease with stage 1 through stage 4 chronic kidney disease, or unspecified chronic kidney disease: Secondary | ICD-10-CM | POA: Diagnosis not present

## 2018-02-19 DIAGNOSIS — E890 Postprocedural hypothyroidism: Secondary | ICD-10-CM | POA: Insufficient documentation

## 2018-02-19 DIAGNOSIS — Z7984 Long term (current) use of oral hypoglycemic drugs: Secondary | ICD-10-CM | POA: Diagnosis not present

## 2018-02-19 DIAGNOSIS — T7840XA Allergy, unspecified, initial encounter: Secondary | ICD-10-CM | POA: Diagnosis not present

## 2018-02-19 MED ORDER — METHYLPREDNISOLONE SODIUM SUCC 125 MG IJ SOLR
125.0000 mg | Freq: Once | INTRAMUSCULAR | Status: AC
  Administered 2018-02-19: 125 mg via INTRAVENOUS
  Filled 2018-02-19: qty 2

## 2018-02-19 MED ORDER — FAMOTIDINE IN NACL 20-0.9 MG/50ML-% IV SOLN
20.0000 mg | Freq: Once | INTRAVENOUS | Status: AC
  Administered 2018-02-19: 20 mg via INTRAVENOUS
  Filled 2018-02-19: qty 50

## 2018-02-19 MED ORDER — EPINEPHRINE 0.3 MG/0.3ML IJ SOAJ
0.3000 mg | Freq: Once | INTRAMUSCULAR | Status: AC
  Administered 2018-02-19: 0.3 mg via INTRAMUSCULAR
  Filled 2018-02-19: qty 0.3

## 2018-02-19 MED ORDER — DIPHENHYDRAMINE HCL 50 MG/ML IJ SOLN
25.0000 mg | Freq: Once | INTRAMUSCULAR | Status: AC
  Administered 2018-02-19: 25 mg via INTRAVENOUS
  Filled 2018-02-19: qty 1

## 2018-02-19 NOTE — Telephone Encounter (Signed)
I was calling Ammie back to go over my lab results for Dr. Prudence Davidson. The best contact number to reach me is [phone] and you can also leave a voice message on my phone. Thank you.

## 2018-02-19 NOTE — ED Triage Notes (Signed)
Pt reports an allergic reaction that started about 2 hours ago. Pt took two Benadryls but states sx are getting worse. Pt does not know what caused sx. Pt denies sob or feeling as if throat is closing up. Pt reports he is itchy everywhere and feels "puffy", also pressure behind eyes. Denies pain.

## 2018-02-20 ENCOUNTER — Telehealth: Payer: Self-pay | Admitting: Podiatry

## 2018-02-20 DIAGNOSIS — T7840XA Allergy, unspecified, initial encounter: Secondary | ICD-10-CM | POA: Diagnosis not present

## 2018-02-20 LAB — CBG MONITORING, ED: GLUCOSE-CAPILLARY: 189 mg/dL — AB (ref 70–99)

## 2018-02-20 MED ORDER — PREDNISONE 50 MG PO TABS: ORAL_TABLET | ORAL | 0 refills | Status: DC

## 2018-02-20 NOTE — ED Provider Notes (Signed)
[facility] EMERGENCY DEPARTMENT Provider Note   CSN: [id number] Arrival date & time: 02/19/18  2139     History   Chief Complaint Chief Complaint  Patient presents with  . Allergic Reaction    HPI Steven [name] is a 37 y.o. male.  The history is provided by the patient.  Allergic Reaction  Presenting symptoms: itching, rash and swelling   Presenting symptoms: no difficulty breathing, no difficulty swallowing and no wheezing   Severity:  Moderate Relieved by:  Nothing Worsened by:  Nothing Patient reports onset of allergic reaction about 2 hours ago.  He reports swelling in his face and around his eyes.  No difficulty swallowing or breathing.  No tongue swelling.  No chest pain or shortness of breath.  He has had multiple episodes before, and is required EpiPen.  He gave himself Benadryl but no EpiPen tonight.  Past Medical History:  Diagnosis Date  . Asthma   . Cancer ([place])   . Diabetes mellitus without complication ([place])   . 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 HTN (hypertension)   . Kaposi's sarcoma   . Syphilis 03/23/09-   1:2    Patient Active Problem List   Diagnosis Date Noted  . AIDS ([place]) 02/09/2018  . GERD (gastroesophageal reflux disease) 03/28/2017  . Asthma 03/28/2017  . Type 2 diabetes mellitus ([place]) 03/25/2015  . CKD (chronic kidney disease) stage 3, GFR 30-59 ml/min ([facility]) 03/23/2015  . Oral thrush 03/23/2015  . Essential hypertension 11/24/2014  . Post-nasal drip 02/05/2014  . 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 Arthralgia of elbow, right 07/29/2013  . Colitis 05/27/2011  . 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 Human immunodeficiency virus (HIV) disease ([place]) 04/10/2009  . Lymphedema 04/10/2009  . SYPHILIS 04/06/2009    Past Surgical History:  Procedure Laterality Date  . BRAIN SURGERY    . IR GENERIC HISTORICAL   02/12/2016   IR REMOVAL TUN ACCESS W/ PORT W/O FL MOD SED 02/12/2016 [name], MD [facility]-INTERV RAD        Home Medications    Prior to Admission medications   Medication Sig Start Date End Date Taking? Authorizing Provider  albuterol (VENTOLIN HFA) 108 (90 Base) MCG/ACT inhaler Inhale two puffs every 4-6 hours if needed for cough or wheeze 02/08/18   Kennith Gain, MD  amLODipine (NORVASC) 10 MG tablet TAKE 1 TABLET(10 MG) BY MOUTH DAILY 02/12/18   Pleas [name], NP  esomeprazole (NEXIUM) 40 MG capsule TAKE ONE CAPSULE BY MOUTH ONCE DAILY AS DIRECTED 02/06/17   Kozlow, Donnamarie Poag, MD  fexofenadine (ALLEGRA) 180 MG tablet Take 1 tablet (180 mg total) by mouth daily. 05/03/12   Harden Mo, MD  fluticasone (FLONASE) 50 MCG/ACT nasal spray Place 2 sprays into both nostrils daily. 02/08/18   Kennith Gain, MD  fluticasone (FLOVENT HFA) 110 MCG/ACT inhaler Inhale 2 puffs into the lungs 2 (two) times daily. 02/08/18   Kennith Gain, MD  fluticasone-salmeterol (ADVAIR HFA) [phone] MCG/ACT inhaler Inhale two puffs twice daily to prevent cough or wheeze 02/08/18   Kennith Gain, MD  hydrochlorothiazide (HYDRODIURIL) 25 MG tablet Take 1 tablet (25 mg total) by mouth daily. 03/28/17   Pleas [name], NP  ibuprofen (ADVIL,MOTRIN) 200 MG tablet Take 400 mg every 6 (six) hours as needed by mouth for fever or mild pain.    [provider]  LANCETS  MICRO THIN 33G MISC USE TO TEST BLOOD SUGAR ONCE A DAY 08/21/17   Tresa Garter, MD  levothyroxine (SYNTHROID, LEVOTHROID) 150 MCG tablet Take 1 daily but extra 1/2 tab once weekly 11/29/17   Elayne Snare, MD  levothyroxine (SYNTHROID, LEVOTHROID) 150 MCG tablet TAKE 1 TABLET BY MOUTH DAILY 01/02/18   Elayne Snare, MD  metFORMIN (GLUCOPHAGE-XR) 500 MG 24 hr tablet Take 2 tablets in the morning and 1 tablet in the evening. 09/15/17   Pleas [name], NP  montelukast (SINGULAIR) 10 MG tablet Take 1 tablet (10 mg  total) by mouth daily. 02/08/18   Kennith Gain, MD  Multiple Vitamin (MULTIVITAMIN) tablet Take 1 tablet by mouth daily.    [provider]  PAZEO 0.7 % SOLN Place 1 drop into both eyes daily. 02/08/18   Kennith Gain, MD  potassium chloride SA (K-DUR,KLOR-CON) 20 MEQ tablet Take 1 tablet (20 mEq total) by mouth daily. 11/30/16   Campbell Riches, MD  TRIUMEQ [phone] MG tablet TAKE 1 TABLET BY MOUTH EVERY DAY 02/06/18   Campbell Riches, MD  TRUE METRIX BLOOD GLUCOSE TEST test strip USE AS DIRECTED FOR THREE TIMES DAILY TESTING OF BLOOD GLUCOSE 12/18/17   Pleas [name], NP    Family History Family History  Problem Relation Age of Onset  . Pancreatic cancer Mother   . Cancer Mother   . Diabetes Paternal Grandmother   . Thyroid disease Paternal Grandmother     Social History Social History   Tobacco Use  . Smoking status: Never Smoker  . Smokeless tobacco: Never Used  Substance Use Topics  . Alcohol use: Yes    Alcohol/week: 0.0 standard drinks    Comment: socially - less now  . Drug use: No     Allergies   Lisinopril; Penicillins; Levaquin [levofloxacin in d5w]; Tessalon [benzonatate]; and Aspirin   Review of Systems Review of Systems  Constitutional: Negative for fever.  HENT: Positive for facial swelling. Negative for trouble swallowing.   Respiratory: Negative for shortness of breath and wheezing.   Skin: Positive for itching and rash.  All other systems reviewed and are negative.    Physical Exam Updated Vital Signs BP (!) 147/92 (BP Location: Right Arm)   Pulse 89   Temp 99 F (37.2 C) (Oral)   Resp (!) 24   Ht 1.854 m (6\' 1" )   Wt 131.5 kg   SpO2 94%   BMI 38.26 kg/m   Physical Exam CONSTITUTIONAL: Well developed/well nourished HEAD: Normocephalic/atraumatic EYES: EOMI/PERRL mild periorbital edema ENMT: Mucous membranes moist, mild facial swelling.  No tongue swelling.  Uvula appears mildly edematous, no  stridor, no drooling, normal phonation NECK: supple no meningeal signs SPINE/BACK:entire spine nontender CV: S1/S2 noted, no murmurs/rubs/gallops noted LUNGS: Lungs are clear to auscultation bilaterally, no apparent distress ABDOMEN: soft, nontender, no rebound or guarding, bowel sounds noted throughout abdomen GU:no cva tenderness NEURO: Pt is awake/alert/appropriate, moves all extremitiesx4.  No facial droop.   EXTREMITIES: pulses normal/equal, full ROM SKIN: warm, color normal no rash PSYCH: no abnormalities of mood noted, alert and oriented to situation   ED Treatments / Results  Labs (all labs ordered are listed, but only abnormal results are displayed) Labs Reviewed  CBG MONITORING, ED - Abnormal; Notable for the following components:      Result Value   Glucose-Capillary 189 (*)    All other components within normal limits    EKG None  Radiology No results found.  Procedures  Procedures (including critical care time)  Medications Ordered in ED Medications  EPINEPHrine (EPI-PEN) injection 0.3 mg (0.3 mg Intramuscular Given 02/19/18 2327)  diphenhydrAMINE (BENADRYL) injection 25 mg (25 mg Intravenous Given 02/19/18 2336)  famotidine (PEPCID) IVPB 20 mg premix (0 mg Intravenous Stopped 02/20/18 0009)  methylPREDNISolone sodium succinate (SOLU-MEDROL) 125 mg/2 mL injection 125 mg (125 mg Intravenous Given 02/19/18 2336)     Initial Impression / Assessment and Plan / ED Course  I have reviewed the triage vital signs and the nursing notes.  Pertinent labs results that were available during my care of the patient were reviewed by me and considered in my medical decision making (see chart for details).    12:12 AM Presents for allergic type reaction.  Due to facial involvement will give an EpiPen.  Patient had multiple episodes previously, unclear cause at this time.  No new change in his medications, no new foods. Monitor closely 1:01 AM Patient stable, resting  comfortably, feeling improved.  We will continue to monitor. 3:56 AM Improved.  He feels near baseline.  Will discharge home, and he has EpiPen's at home.  We will place him on prednisone.  We discussed strict return precautions Plans to follow back up with his allergist. Final Clinical Impressions(s) / ED Diagnoses   Final diagnoses:  Allergic reaction, initial encounter  Angioedema, initial encounter    ED Discharge Orders         Ordered    predniSONE (DELTASONE) 50 MG tablet     02/20/18 0356           Ripley Fraise, MD 02/20/18 [phone]

## 2018-02-20 NOTE — Telephone Encounter (Signed)
Pt. Returning your call to go over some results with him.

## 2018-02-21 ENCOUNTER — Telehealth: Payer: Self-pay | Admitting: *Deleted

## 2018-02-21 NOTE — Telephone Encounter (Signed)
Call patient to discuss possible fungus lab results, no answer,left message for call back, 3rd attempt.

## 2018-02-21 NOTE — Telephone Encounter (Signed)
Called patient, 2nd attempt, no answer, left message for call back for results.

## 2018-02-21 NOTE — Telephone Encounter (Signed)
Return call from Mr Aron Baba, informed him of lab results and liver function test performed, verbalized understanding and will schedule f/u appt.for treatment.

## 2018-02-21 NOTE — Telephone Encounter (Signed)
Called and left message for patient to return call back for [facility] lab results, 2nd attempt.

## 2018-02-23 ENCOUNTER — Ambulatory Visit: Payer: PPO | Admitting: Podiatry

## 2018-02-27 ENCOUNTER — Telehealth: Payer: Self-pay | Admitting: *Deleted

## 2018-02-27 NOTE — Telephone Encounter (Signed)
Had L/M for patient that his Xolair would arrive last week and for him to call for appt to restart.  I again L/M for patient to call office to restart therapy

## 2018-03-07 ENCOUNTER — Other Ambulatory Visit: Payer: Self-pay | Admitting: Infectious Diseases

## 2018-03-07 DIAGNOSIS — B20 Human immunodeficiency virus [HIV] disease: Secondary | ICD-10-CM

## 2018-03-23 ENCOUNTER — Other Ambulatory Visit: Payer: Self-pay | Admitting: Primary Care

## 2018-03-23 ENCOUNTER — Telehealth: Payer: Self-pay | Admitting: Primary Care

## 2018-03-23 ENCOUNTER — Ambulatory Visit (INDEPENDENT_AMBULATORY_CARE_PROVIDER_SITE_OTHER): Payer: PPO | Admitting: Primary Care

## 2018-03-23 ENCOUNTER — Ambulatory Visit: Payer: PPO

## 2018-03-23 ENCOUNTER — Telehealth: Payer: Self-pay

## 2018-03-23 ENCOUNTER — Encounter: Payer: Self-pay | Admitting: Primary Care

## 2018-03-23 ENCOUNTER — Ambulatory Visit: Payer: PPO | Admitting: Primary Care

## 2018-03-23 VITALS — BP 118/78 | HR 76 | Temp 98.0°F | Ht 73.0 in | Wt 284.0 lb

## 2018-03-23 DIAGNOSIS — E876 Hypokalemia: Secondary | ICD-10-CM

## 2018-03-23 DIAGNOSIS — Z23 Encounter for immunization: Secondary | ICD-10-CM

## 2018-03-23 DIAGNOSIS — E119 Type 2 diabetes mellitus without complications: Secondary | ICD-10-CM | POA: Diagnosis not present

## 2018-03-23 DIAGNOSIS — I10 Essential (primary) hypertension: Secondary | ICD-10-CM

## 2018-03-23 LAB — BASIC METABOLIC PANEL
BUN: 15 mg/dL (ref 6–23)
CALCIUM: 9.6 mg/dL (ref 8.4–10.5)
CO2: 32 mEq/L (ref 19–32)
CREATININE: 1.37 mg/dL (ref 0.40–1.50)
Chloride: 95 mEq/L — ABNORMAL LOW (ref 96–112)
GFR: 74.45 mL/min (ref >60.00)
GLUCOSE: 169 mg/dL — AB (ref 70–99)
Potassium: 2.8 mEq/L — CL (ref 3.5–5.1)
Sodium: 141 mEq/L (ref 135–145)

## 2018-03-23 LAB — LIPID PANEL
CHOL/HDL RATIO: 5
Cholesterol: 167 mg/dL (ref 0–200)
HDL: 33.8 mg/dL — AB (ref >39.00)
LDL CALC: 103 mg/dL — AB (ref 0–99)
NONHDL: 133.04
TRIGLYCERIDES: 150 mg/dL — AB (ref 0.0–149.0)
VLDL: 30 mg/dL (ref 0.0–40.0)

## 2018-03-23 LAB — HEMOGLOBIN A1C: Hgb A1c MFr Bld: 7.3 % — ABNORMAL HIGH (ref 4.6–6.5)

## 2018-03-23 MED ORDER — METFORMIN HCL ER 500 MG PO TB24: ORAL_TABLET | ORAL | 3 refills | Status: DC

## 2018-03-23 MED ORDER — POTASSIUM CHLORIDE CRYS ER 20 MEQ PO TBCR: 20.0000 meq | EXTENDED_RELEASE_TABLET | Freq: Every day | ORAL | 3 refills | Status: DC

## 2018-03-23 MED ORDER — HYDROCHLOROTHIAZIDE 25 MG PO TABS
ORAL_TABLET | ORAL | 3 refills | Status: DC
Start: 2018-03-23 — End: 2018-08-10

## 2018-03-23 MED ORDER — AMLODIPINE BESYLATE 10 MG PO TABS: ORAL_TABLET | ORAL | 0 refills | Status: DC

## 2018-03-23 MED ORDER — POTASSIUM CHLORIDE CRYS ER 20 MEQ PO TBCR: 20.0000 meq | EXTENDED_RELEASE_TABLET | Freq: Two times a day (BID) | ORAL | 0 refills | Status: DC

## 2018-03-23 NOTE — Progress Notes (Addendum)
Patient called and I had mixed his Xolair and when he came in he over heard me telling another patient that if they had an injection within 48 hours we would not be able to give their allergy shots and he came up and asked if that applied to him with his Xolair and I informed him yes. He told me he got his flu shot this morning. After talking with beth we will see if any one else is coming for Xolair and if so we will exchange the medications however if not then we will switch his medication out.

## 2018-03-23 NOTE — Patient Instructions (Signed)
Stop by the lab prior to leaving today. I will notify you of your results once received.   Continue Metformin as prescribed. Keep work on [employer] and continue exercising.   I sent refills of your medication to the pharmacy.  Please schedule a physical with me in 6 months. You may also schedule a lab only appointment 3-4 days prior. We will discuss your lab results in detail during your physical.  It was a pleasure to see you today!

## 2018-03-23 NOTE — Assessment & Plan Note (Signed)
Stable in the office today.  Continue current regimen. 

## 2018-03-23 NOTE — Addendum Note (Signed)
Addended by: Jacqualin Combes on: 03/23/2018 01:22 PM   Modules accepted: Orders

## 2018-03-23 NOTE — Progress Notes (Signed)
Subjective:    Patient ID: Steven [name], male    DOB: Sep 01, 1980, 37 y.o.   MRN: [id number]  HPI  Steven [name] is a 37 year old male who presents today for follow up.  1) Type 2 Diabetes:  Current medications include: Metformin XR 100 mg AM and 500 mg PM  He is checking his blood glucose 2 times daily and is getting readings of: AM fasting: 70's-110 1 hour after dinner: 120-150  Last A1C: 5.7 in March 2019 Last Eye Exam: Completed in August 2019 Last Foot Exam: Completed in March 2019 Pneumonia Vaccination: Completed in 2016 ACE/ARB: Urine microalbumin negative in March 2019 Statin: None. LDL of 102. Repeat pending.  Diet currently consists of:  Breakfast: Fruit, muffin, bacon/eggs, grits, cereal Lunch: Salad, sandwich, crackers, chips,skips sometimes Dinner: Meat, vegetable, starch Snacks: None Desserts: Once every 2 weeks Beverages: Water, diet soda, social alcohol   Exercise: Gym exercise three days weekly  BP Readings from Last 3 Encounters:  03/23/18 118/78  02/20/18 (!) 133/92  02/08/18 112/68       Review of Systems  Eyes: Negative for visual disturbance.  Respiratory: Negative for shortness of breath.   Cardiovascular: Negative for chest pain.  Neurological: Negative for dizziness and numbness.       Past Medical History:  Diagnosis Date  . Asthma   . Cancer ([place])   . Diabetes mellitus without complication ([place])   . HIV positive ([place]) 03/23/09   Genotype Y181C  . HTN (hypertension)   . Kaposi's sarcoma   . Syphilis 03/23/09-   1:2     Social History   Socioeconomic History  . Marital status: Single    Spouse name: Not on file  . Number of children: Not on file  . Years of education: Not on file  . Highest education level: Not on file  Occupational History  . Not on file  Social Needs  . Financial resource strain: Not on file  . Food insecurity:    Worry: Not on file    Inability: Not on file  . Transportation needs:   Medical: Not on file    Non-medical: Not on file  Tobacco Use  . Smoking status: Never Smoker  . Smokeless tobacco: Never Used  Substance and Sexual Activity  . Alcohol use: Yes    Alcohol/week: 0.0 standard drinks    Comment: socially - less now  . Drug use: No  . Sexual activity: Not Currently    Partners: Male    Birth control/protection: Condom    Comment: pt. declined condoms  Lifestyle  . Physical activity:    Days per week: Not on file    Minutes per session: Not on file  . Stress: Not on file  Relationships  . Social connections:    Talks on phone: Not on file    Gets together: Not on file    Attends religious service: Not on file    Active member of club or organization: Not on file    Attends meetings of clubs or organizations: Not on file    Relationship status: Not on file  . Intimate partner violence:    Fear of current or ex partner: Not on file    Emotionally abused: Not on file    Physically abused: Not on file    Forced sexual activity: Not on file  Other Topics Concern  . Not on file  Social History Narrative   Single.    No children.  Works in Therapist, art   Enjoys DJ, traveling.     Past Surgical History:  Procedure Laterality Date  . BRAIN SURGERY    . IR GENERIC HISTORICAL  02/12/2016   IR REMOVAL TUN ACCESS W/ PORT W/O FL MOD SED 02/12/2016 [name], MD [facility]    Family History  Problem Relation Age of Onset  . Pancreatic cancer Mother   . Cancer Mother   . Diabetes Paternal Grandmother   . Thyroid disease Paternal Grandmother     Allergies  Allergen Reactions  . Lisinopril Swelling    Swelling of lower lip while on lisinopril  . Penicillins Hives and Swelling    REACTION: rash and swelling  . Levaquin [Levofloxacin In D5w] Rash  . Tessalon [Benzonatate] Rash  . Aspirin Nausea Only    Current Outpatient Medications on File Prior to Visit  Medication Sig Dispense Refill  . albuterol (VENTOLIN HFA) 108 (90 Base)  MCG/ACT inhaler Inhale two puffs every 4-6 hours if needed for cough or wheeze (Patient taking differently: Inhale 1-2 puffs into the lungs every 4 (four) hours as needed for wheezing. ) 18 g 1  . esomeprazole (NEXIUM) 40 MG capsule TAKE ONE CAPSULE BY MOUTH ONCE DAILY AS DIRECTED (Patient taking differently: Take 40 mg by mouth daily. ) 30 capsule 0  . fexofenadine (ALLEGRA) 180 MG tablet Take 1 tablet (180 mg total) by mouth daily. 15 tablet 0  . fluticasone (FLONASE) 50 MCG/ACT nasal spray Place 2 sprays into both nostrils daily. 16 g 5  . fluticasone (FLOVENT HFA) 110 MCG/ACT inhaler Inhale 2 puffs into the lungs 2 (two) times daily. 1 Inhaler 5  . fluticasone-salmeterol (ADVAIR HFA) 230-21 MCG/ACT inhaler Inhale two puffs twice daily to prevent cough or wheeze (Patient taking differently: Inhale 2 puffs into the lungs 2 (two) times daily. ) 12 g 5  . LANCETS MICRO THIN 33G MISC USE TO TEST BLOOD SUGAR ONCE A DAY 100 each 2  . levothyroxine (SYNTHROID, LEVOTHROID) 150 MCG tablet TAKE 1 TABLET BY MOUTH DAILY (Patient taking differently: Take 150 mcg by mouth daily before breakfast. Take 1 tablet by mouth daily.) 30 tablet 0  . montelukast (SINGULAIR) 10 MG tablet Take 1 tablet (10 mg total) by mouth daily. 30 tablet 5  . Multiple Vitamin (MULTIVITAMIN) tablet Take 1 tablet by mouth daily.    Marland Kitchen PAZEO 0.7 % SOLN Place 1 drop into both eyes daily. 1 Bottle 5  . TRIUMEQ 600-50-300 MG tablet TAKE 1 TABLET BY MOUTH EVERY DAY 30 tablet 2  . TRUE METRIX BLOOD GLUCOSE TEST test strip USE AS DIRECTED FOR THREE TIMES DAILY TESTING OF BLOOD GLUCOSE 300 each 2   Current Facility-Administered Medications on File Prior to Visit  Medication Dose Route Frequency Provider Last Rate Last Dose  . omalizumab Arvid Right) injection 300 mg  300 mg Subcutaneous Q14 Days Kozlow, Donnamarie Poag, MD   300 mg at 03/23/17 1731    BP 118/78   Pulse 76   Temp 98 F (36.7 C) (Oral)   Ht 6\' 1"  (1.854 m)   Wt 284 lb (128.8 kg)   SpO2  98%   BMI 37.47 kg/m    Objective:   Physical Exam  Constitutional: He appears well-nourished.  Neck: Neck supple.  Cardiovascular: Normal rate and regular rhythm.  Respiratory: Effort normal and breath sounds normal.  Skin: Skin is warm and dry.           Assessment & Plan:

## 2018-03-23 NOTE — Telephone Encounter (Signed)
Left a detailed message concerning the patient upcoming scheduled appointmentl. Per 9/13 phone msg return

## 2018-03-23 NOTE — Telephone Encounter (Signed)
[facility] called with critical results.  Potassium 2.8 Results given verbally to Alma Friendly, NP.

## 2018-03-23 NOTE — Telephone Encounter (Signed)
Left detail message on patient's phone. Will treat with 20 mEq BID x 5 days, then resume 20 mEq potassium daily. Will also leave my chart message.

## 2018-03-23 NOTE — Assessment & Plan Note (Signed)
Repeat A1C pending. Commended him on regular exercise and working on his diet.  Repeat lipids. Urine microalbumin negative. Pneumonia vaccination UTD. Foot and eye exam UTD.  Continue Metformin XR as prescribed. Follow up in 6 months.

## 2018-03-26 ENCOUNTER — Telehealth: Payer: Self-pay

## 2018-03-26 NOTE — Telephone Encounter (Signed)
Left a voice mail of upcoming appointment, and mailed a calender. Per 9/16 phone msg return. CONTACTED PATIENT ON Friday ALSO

## 2018-03-30 ENCOUNTER — Telehealth: Payer: Self-pay

## 2018-03-30 NOTE — Telephone Encounter (Signed)
Per 9/20 patient called and requested to change appointment date. Per phone call return

## 2018-04-04 ENCOUNTER — Ambulatory Visit (INDEPENDENT_AMBULATORY_CARE_PROVIDER_SITE_OTHER): Payer: PPO | Admitting: Podiatry

## 2018-04-04 ENCOUNTER — Encounter: Payer: Self-pay | Admitting: Podiatry

## 2018-04-04 DIAGNOSIS — B351 Tinea unguium: Secondary | ICD-10-CM

## 2018-04-04 DIAGNOSIS — E119 Type 2 diabetes mellitus without complications: Secondary | ICD-10-CM

## 2018-04-04 DIAGNOSIS — M79676 Pain in unspecified toe(s): Secondary | ICD-10-CM | POA: Diagnosis not present

## 2018-04-04 NOTE — Progress Notes (Signed)
Complaint:  Visit Type: Patient returns to my office for continued preventative foot care services. Complaint: Patient states" my nails have grown long and thick and become painful to walk and wear shoes"  The patient presents for preventative foot care services. No changes to ROS.  Patient has had blood tests for possible lamisil usage.  Podiatric Exam: Vascular: dorsalis pedis and posterior tibial pulses are palpable bilateral. Capillary return is immediate. Temperature gradient is WNL. Skin turgor WNL  Sensorium: Normal Semmes Weinstein monofilament test. Normal tactile sensation bilaterally. Nail Exam: Pt has thick disfigured discolored nails with subungual debris noted bilateral entire nail hallux through fifth toenails Ulcer Exam: There is no evidence of ulcer or pre-ulcerative changes or infection. Orthopedic Exam: Muscle tone and strength are WNL. No limitations in general ROM. No crepitus or effusions noted. Foot type and digits show no abnormalities. Bony prominences are unremarkable. Skin: No Porokeratosis. No infection or ulcers  Diagnosis:  Onychomycosis, , Pain in right toe, pain in left toes  Treatment & Plan Procedures and Treatment: Consent by patient was obtained for treatment procedures. The patient understood the discussion of treatment and procedures well. All questions were answered thoroughly reviewed. iscussed the results of his lab work ordered I his medical doctor.  He has significant abnormalities in the lab work and, therefore, I suggested we hold off treating his nails because I do not want any damage to his liver.  RTC 3 months. Told him he can try topical medicine for his nails. Return Visit-Office Procedure: Patient instructed to return to the office for a follow up visit 3 months for continued evaluation and treatment.    Gardiner Barefoot DPM

## 2018-04-06 ENCOUNTER — Encounter (HOSPITAL_COMMUNITY): Payer: Self-pay

## 2018-04-06 ENCOUNTER — Other Ambulatory Visit: Payer: Self-pay

## 2018-04-06 ENCOUNTER — Ambulatory Visit (HOSPITAL_COMMUNITY)
Admission: EM | Admit: 2018-04-06 | Discharge: 2018-04-06 | Disposition: A | Discharge status: Home / Self Care | Payer: PPO | Attending: Internal Medicine | Admitting: Internal Medicine

## 2018-04-06 ENCOUNTER — Ambulatory Visit (INDEPENDENT_AMBULATORY_CARE_PROVIDER_SITE_OTHER): Payer: PPO

## 2018-04-06 DIAGNOSIS — K148 Other diseases of tongue: Secondary | ICD-10-CM

## 2018-04-06 DIAGNOSIS — R498 Other voice and resonance disorders: Secondary | ICD-10-CM

## 2018-04-06 DIAGNOSIS — H5789 Other specified disorders of eye and adnexa: Secondary | ICD-10-CM | POA: Diagnosis not present

## 2018-04-06 DIAGNOSIS — J4550 Severe persistent asthma, uncomplicated: Secondary | ICD-10-CM | POA: Diagnosis not present

## 2018-04-06 DIAGNOSIS — T7840XA Allergy, unspecified, initial encounter: Secondary | ICD-10-CM | POA: Diagnosis not present

## 2018-04-06 MED ORDER — METHYLPREDNISOLONE SODIUM SUCC 125 MG IJ SOLR
INTRAMUSCULAR | Status: AC
  Filled 2018-04-06: qty 2

## 2018-04-06 MED ORDER — METHYLPREDNISOLONE SODIUM SUCC 125 MG IJ SOLR
80.0000 mg | Freq: Once | INTRAMUSCULAR | Status: AC
  Administered 2018-04-06: 80 mg via INTRAMUSCULAR

## 2018-04-06 MED ORDER — PREDNISONE 50 MG PO TABS: 50.0000 mg | ORAL_TABLET | Freq: Every day | ORAL | 0 refills | Status: DC

## 2018-04-06 MED ORDER — DIPHENHYDRAMINE HCL 50 MG/ML IJ SOLN
50.0000 mg | Freq: Once | INTRAMUSCULAR | Status: AC
  Administered 2018-04-06: 50 mg via INTRAMUSCULAR

## 2018-04-06 MED ORDER — DIPHENHYDRAMINE HCL 50 MG/ML IJ SOLN
INTRAMUSCULAR | Status: AC
  Filled 2018-04-06: qty 1

## 2018-04-06 NOTE — Progress Notes (Signed)
Immunotherapy   Patient Details  Name: Steven [name] MRN: [id number] Date of Birth: Jul 29, 1980  04/06/2018  Steven [name]  Patient started Xolair today and received 300 mg. Patient waited for hour with no problem. Following schedule: every 2 weeks Epi-Pen: yes Consent signed and patient instructions given.   Herbie Drape 04/06/2018, 11:07 AM

## 2018-04-06 NOTE — ED Provider Notes (Signed)
[name]    CSN: [id number] Arrival date & time: 04/06/18  1702     History   Chief Complaint Chief Complaint  Patient presents with  . Allergic Reaction    HPI Steven [name] is a 37 y.o. male.   He has past history notable for frequent significant allergic reactions,  involving swelling of the eyes, lips, tongue and itching.  He is on a biologic agent for this, and had an injection earlier today.  Symptoms began about 45 minutes prior to presentation.  Patient took an oral Benadryl tablet, and came to the urgent care when symptoms seemed to be getting worse.  He has swelling of the tissue around both eyes, and his speech is a little distorted.  He reports that his tongue feels swollen.  Not drooling, not coughing.    HPI  Past Medical History:  Diagnosis Date  . Asthma   . Cancer ([place])   . Diabetes mellitus without complication ([place])   . HIV positive ([place]) 03/23/09   Genotype Y181C  . HTN (hypertension)   . Kaposi's sarcoma   . Syphilis 03/23/09-   1:2    Patient Active Problem List   Diagnosis Date Noted  . AIDS ([place]) 02/09/2018  . GERD (gastroesophageal reflux disease) 03/28/2017  . Asthma 03/28/2017  . Type 2 diabetes mellitus ([place]) 03/25/2015  . CKD (chronic kidney disease) stage 3, GFR 30-59 ml/min ([facility]) 03/23/2015  . Oral thrush 03/23/2015  . Essential hypertension 11/24/2014  . Post-nasal drip 02/05/2014  . 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 Arthralgia of elbow, right 07/29/2013  . Colitis 05/27/2011  . KS (Kaposi's sarcoma) ([place]) 04/11/2011  . 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 Human immunodeficiency virus (HIV) disease ([place]) 04/10/2009  . Lymphedema 04/10/2009  . SYPHILIS 04/06/2009    Past Surgical History:  Procedure Laterality Date  . BRAIN SURGERY    . IR GENERIC HISTORICAL  02/12/2016   IR REMOVAL TUN ACCESS W/ PORT W/O FL MOD SED 02/12/2016 [name], MD [facility]        Home Medications    Prior to Admission medications   Medication Sig Start Date End Date Taking? Authorizing Provider  albuterol (VENTOLIN HFA) 108 (90 Base) MCG/ACT inhaler Inhale two puffs every 4-6 hours if needed for cough or wheeze Patient taking differently: Inhale 1-2 puffs into the lungs every 4 (four) hours as needed for wheezing.  02/08/18   Kennith Gain, MD  amLODipine (NORVASC) 10 MG tablet TAKE 1 TABLET(10 MG) BY MOUTH DAILY for blood pressure. 03/23/18   Pleas [name], NP  esomeprazole (NEXIUM) 40 MG capsule TAKE ONE CAPSULE BY MOUTH ONCE DAILY AS DIRECTED Patient taking differently: Take 40 mg by mouth daily.  02/06/17   Kozlow, Donnamarie Poag, MD  fexofenadine (ALLEGRA) 180 MG tablet Take 1 tablet (180 mg total) by mouth daily. 05/03/12   Harden Mo, MD  fluticasone (FLONASE) 50 MCG/ACT nasal spray Place 2 sprays into both nostrils daily. 02/08/18   Kennith Gain, MD  fluticasone (FLOVENT HFA) 110 MCG/ACT inhaler Inhale 2 puffs into the lungs 2 (two) times daily. 02/08/18   Kennith Gain, MD  fluticasone-salmeterol (ADVAIR HFA) [phone] MCG/ACT inhaler Inhale two puffs twice daily to prevent cough or wheeze Patient taking differently: Inhale 2 puffs into the lungs 2 (two) times daily.  02/08/18   Kennith Gain, MD  hydrochlorothiazide (HYDRODIURIL) 25 MG tablet Take 1 tablet by mouth once daily for blood  pressure. 03/23/18   Pleas [name], NP  LANCETS MICRO THIN 33G MISC USE TO TEST BLOOD SUGAR ONCE A DAY 08/21/17   Tresa Garter, MD  levothyroxine (SYNTHROID, LEVOTHROID) 150 MCG tablet TAKE 1 TABLET BY MOUTH DAILY Patient taking differently: Take 150 mcg by mouth daily before breakfast. Take 1 tablet by mouth daily. 01/02/18   Elayne Snare, MD  metFORMIN (GLUCOPHAGE-XR) 500 MG 24 hr tablet Take 2 tablets in the morning and 1 tablet in the evening. 03/23/18   Pleas [name], NP  montelukast (SINGULAIR) 10 MG tablet Take 1  tablet (10 mg total) by mouth daily. 02/08/18   Kennith Gain, MD  Multiple Vitamin (MULTIVITAMIN) tablet Take 1 tablet by mouth daily.    [provider]  PAZEO 0.7 % SOLN Place 1 drop into both eyes daily. 02/08/18   Kennith Gain, MD  potassium chloride SA (K-DUR,KLOR-CON) 20 MEQ tablet Take 1 tablet (20 mEq total) by mouth daily. 03/23/18   Pleas [name], NP  potassium chloride SA (K-DUR,KLOR-CON) 20 MEQ tablet Take 1 tablet (20 mEq total) by mouth 2 (two) times daily. 03/23/18   Pleas [name], NP  predniSONE (DELTASONE) 50 MG tablet Take 1 tablet (50 mg total) by mouth daily. 04/06/18   Wynona Luna, MD  TRIUMEQ [phone] MG tablet TAKE 1 TABLET BY MOUTH EVERY DAY 03/08/18   Campbell Riches, MD  TRUE METRIX BLOOD GLUCOSE TEST test strip USE AS DIRECTED FOR THREE TIMES DAILY TESTING OF BLOOD GLUCOSE 12/18/17   Pleas [name], NP    Family History Family History  Problem Relation Age of Onset  . Pancreatic cancer Mother   . Cancer Mother   . Diabetes Paternal Grandmother   . Thyroid disease Paternal Grandmother     Social History Social History   Tobacco Use  . Smoking status: Never Smoker  . Smokeless tobacco: Never Used  Substance Use Topics  . Alcohol use: Yes    Alcohol/week: 0.0 standard drinks    Comment: socially - less now  . Drug use: No     Allergies   Lisinopril; Penicillins; Levaquin [levofloxacin in d5w]; Tessalon [benzonatate]; and Aspirin   Review of Systems Review of Systems  All other systems reviewed and are negative.    Physical Exam Triage Vital Signs ED Triage Vitals  Enc Vitals Group     BP 04/06/18 1910 118/80     Pulse Rate 04/06/18 1910 85     Resp 04/06/18 1910 16     Temp 04/06/18 1910 98.5 F (36.9 C)     Temp src --      SpO2 04/06/18 1910 97 %     Weight 04/06/18 1821 189 lb (85.7 kg)     Height --      Pain Score --      Pain Loc --    Updated Vital Signs BP 118/80   Pulse  85   Temp 98.5 F (36.9 C)   Resp 16   Wt 85.7 kg   SpO2 97%   BMI 24.94 kg/m  Physical Exam  Constitutional: He is oriented to person, place, and time. No distress.  Alert, nicely groomed  HENT:  Head: Atraumatic.  Eyes:  Conjugate gaze, no eye redness/drainage  Neck: Neck supple.  Cardiovascular: Normal rate and regular rhythm.  Pulmonary/Chest: No respiratory distress. He has no wheezes. He has no rales.  Lungs clear, symmetric breath sounds  Abdominal: He exhibits no distension.  Musculoskeletal:  Normal range of motion.  Neurological: He is alert and oriented to person, place, and time.  Skin: Skin is warm and dry.  No cyanosis Lateral periorbital swelling, with swelling of the upper lip and speech distortion.  Tongue is not protruding.    Nursing note and vitals reviewed.    UC Treatments / Results   Procedures Procedures (including critical care time)  Medications Ordered in UC Medications  methylPREDNISolone sodium succinate (SOLU-MEDROL) 125 mg/2 mL injection 80 mg (80 mg Intramuscular Given 04/06/18 1854)  diphenhydrAMINE (BENADRYL) injection 50 mg (50 mg Intramuscular Given 04/06/18 1854)   Final Clinical Impressions(s) / UC Diagnoses   Final diagnoses:  Allergic reaction, initial encounter     Discharge Instructions     Injections of benadryl and solumedrol given at the urgent care.  Take additional benadryl as needed for itching, swelling.  Prescription for prednisone sent to the pharmacy.  Note for work tomorrow.  Go to ED for shortness of breath, throat swelling.   ED Prescriptions    Medication Sig Dispense Auth. Provider   predniSONE (DELTASONE) 50 MG tablet Take 1 tablet (50 mg total) by mouth daily. 3 tablet Wynona Luna, MD        Wynona Luna, MD 04/09/18 2147

## 2018-04-06 NOTE — Discharge Instructions (Addendum)
Injections of benadryl and solumedrol given at the urgent care.  Take additional benadryl as needed for itching, swelling.  Prescription for prednisone sent to the pharmacy.  Note for work tomorrow.  Go to ED for shortness of breath, throat swelling.

## 2018-04-06 NOTE — ED Triage Notes (Signed)
Pt states he is having a allergy reaction.. Swelling and itching. This started today.

## 2018-04-11 ENCOUNTER — Other Ambulatory Visit: Payer: PPO

## 2018-04-11 ENCOUNTER — Other Ambulatory Visit (HOSPITAL_COMMUNITY)
Admission: RE | Admit: 2018-04-11 | Discharge: 2018-04-11 | Disposition: A | Discharge status: Home / Self Care | Payer: PPO | Source: Ambulatory Visit | Attending: Infectious Diseases | Admitting: Infectious Diseases

## 2018-04-11 DIAGNOSIS — Z113 Encounter for screening for infections with a predominantly sexual mode of transmission: Secondary | ICD-10-CM | POA: Diagnosis not present

## 2018-04-11 DIAGNOSIS — B20 Human immunodeficiency virus [HIV] disease: Secondary | ICD-10-CM

## 2018-04-11 DIAGNOSIS — Z79899 Other long term (current) drug therapy: Secondary | ICD-10-CM

## 2018-04-13 LAB — COMPREHENSIVE METABOLIC PANEL
AG RATIO: 1.6 (calc) (ref 1.0–2.5)
ALT: 29 U/L (ref 9–46)
AST: 15 U/L (ref 10–40)
Albumin: 4.6 g/dL (ref 3.6–5.1)
Alkaline phosphatase (APISO): 47 U/L (ref 40–115)
BILIRUBIN TOTAL: 1 mg/dL (ref 0.2–1.2)
BUN/Creatinine Ratio: 14 (calc) (ref 6–22)
BUN: 20 mg/dL (ref 7–25)
CALCIUM: 9.9 mg/dL (ref 8.6–10.3)
CHLORIDE: 96 mmol/L — AB (ref 98–110)
CO2: 31 mmol/L (ref 20–32)
Creat: 1.47 mg/dL — ABNORMAL HIGH (ref 0.60–1.35)
GLOBULIN: 2.8 g/dL (ref 1.9–3.7)
Glucose, Bld: 124 mg/dL — ABNORMAL HIGH (ref 65–99)
POTASSIUM: 3.4 mmol/L — AB (ref 3.5–5.3)
SODIUM: 140 mmol/L (ref 135–146)
Total Protein: 7.4 g/dL (ref 6.1–8.1)

## 2018-04-13 LAB — URINE CYTOLOGY ANCILLARY ONLY
Chlamydia: NEGATIVE
NEISSERIA GONORRHEA: NEGATIVE

## 2018-04-13 LAB — CBC
HCT: 49.5 % (ref 38.5–50.0)
Hemoglobin: 15.9 g/dL (ref 13.2–17.1)
MCH: 25.4 pg — AB (ref 27.0–33.0)
MCHC: 32.1 g/dL (ref 32.0–36.0)
MCV: 78.9 fL — AB (ref 80.0–100.0)
MPV: 12.5 fL (ref 7.5–12.5)
PLATELETS: 277 10*3/uL (ref 140–400)
RBC: 6.27 10*6/uL — AB (ref 4.20–5.80)
RDW: 13.8 % (ref 11.0–15.0)
WBC: 6.6 10*3/uL (ref 3.8–10.8)

## 2018-04-13 LAB — LIPID PANEL
CHOLESTEROL: 163 mg/dL (ref <200)
HDL: 39 mg/dL — ABNORMAL LOW (ref >40)
LDL CHOLESTEROL (CALC): 97 mg/dL
Non-HDL Cholesterol (Calc): 124 mg/dL (calc) (ref <130)
Total CHOL/HDL Ratio: 4.2 (calc) (ref <5.0)
Triglycerides: 174 mg/dL — ABNORMAL HIGH (ref <150)

## 2018-04-13 LAB — T-HELPER CELL (CD4) - (RCID CLINIC ONLY)
CD4 % Helper T Cell: 20 % — ABNORMAL LOW (ref 33–55)
CD4 T Cell Abs: 550 /uL (ref 400–2700)

## 2018-04-13 LAB — RPR: RPR: REACTIVE — AB

## 2018-04-13 LAB — HIV-1 RNA QUANT-NO REFLEX-BLD
HIV 1 RNA Quant: <20 copies/mL
HIV-1 RNA Quant, Log: <1.3 Log copies/mL

## 2018-04-13 LAB — FLUORESCENT TREPONEMAL AB(FTA)-IGG-BLD: FLUORESCENT TREPONEMAL ABS: REACTIVE — AB

## 2018-04-13 LAB — RPR TITER

## 2018-04-20 ENCOUNTER — Ambulatory Visit: Payer: Self-pay

## 2018-04-23 ENCOUNTER — Other Ambulatory Visit: Payer: Self-pay | Admitting: Primary Care

## 2018-04-23 ENCOUNTER — Other Ambulatory Visit: Payer: Self-pay

## 2018-04-23 ENCOUNTER — Emergency Department (HOSPITAL_COMMUNITY)
Admission: EM | Admit: 2018-04-23 | Discharge: 2018-04-24 | Disposition: A | Discharge status: Home / Self Care | Payer: PPO | Attending: Emergency Medicine | Admitting: Emergency Medicine

## 2018-04-23 ENCOUNTER — Other Ambulatory Visit: Payer: Self-pay | Admitting: Allergy and Immunology

## 2018-04-23 ENCOUNTER — Encounter (HOSPITAL_COMMUNITY): Payer: Self-pay | Admitting: Emergency Medicine

## 2018-04-23 ENCOUNTER — Other Ambulatory Visit: Payer: Self-pay | Admitting: Allergy

## 2018-04-23 ENCOUNTER — Other Ambulatory Visit: Payer: Self-pay | Admitting: Internal Medicine

## 2018-04-23 DIAGNOSIS — E039 Hypothyroidism, unspecified: Secondary | ICD-10-CM | POA: Insufficient documentation

## 2018-04-23 DIAGNOSIS — T7840XA Allergy, unspecified, initial encounter: Secondary | ICD-10-CM | POA: Insufficient documentation

## 2018-04-23 DIAGNOSIS — E1122 Type 2 diabetes mellitus with diabetic chronic kidney disease: Secondary | ICD-10-CM | POA: Diagnosis not present

## 2018-04-23 DIAGNOSIS — Z79899 Other long term (current) drug therapy: Secondary | ICD-10-CM | POA: Insufficient documentation

## 2018-04-23 DIAGNOSIS — E119 Type 2 diabetes mellitus without complications: Secondary | ICD-10-CM

## 2018-04-23 DIAGNOSIS — I129 Hypertensive chronic kidney disease with stage 1 through stage 4 chronic kidney disease, or unspecified chronic kidney disease: Secondary | ICD-10-CM | POA: Insufficient documentation

## 2018-04-23 DIAGNOSIS — N183 Chronic kidney disease, stage 3 (moderate): Secondary | ICD-10-CM | POA: Insufficient documentation

## 2018-04-23 DIAGNOSIS — Z7984 Long term (current) use of oral hypoglycemic drugs: Secondary | ICD-10-CM | POA: Insufficient documentation

## 2018-04-23 DIAGNOSIS — J45909 Unspecified asthma, uncomplicated: Secondary | ICD-10-CM | POA: Insufficient documentation

## 2018-04-23 DIAGNOSIS — E876 Hypokalemia: Secondary | ICD-10-CM

## 2018-04-23 DIAGNOSIS — Z859 Personal history of malignant neoplasm, unspecified: Secondary | ICD-10-CM | POA: Diagnosis not present

## 2018-04-23 DIAGNOSIS — H5789 Other specified disorders of eye and adnexa: Secondary | ICD-10-CM | POA: Diagnosis present

## 2018-04-23 MED ORDER — DIPHENHYDRAMINE HCL 50 MG/ML IJ SOLN
25.0000 mg | Freq: Once | INTRAMUSCULAR | Status: AC
  Administered 2018-04-24: 25 mg via INTRAVENOUS
  Filled 2018-04-23: qty 1

## 2018-04-23 MED ORDER — METHYLPREDNISOLONE SODIUM SUCC 125 MG IJ SOLR
125.0000 mg | Freq: Once | INTRAMUSCULAR | Status: AC
  Administered 2018-04-24: 125 mg via INTRAVENOUS
  Filled 2018-04-23: qty 2

## 2018-04-23 MED ORDER — SODIUM CHLORIDE 0.9 % IV BOLUS
1000.0000 mL | Freq: Once | INTRAVENOUS | Status: AC
  Administered 2018-04-24: 1000 mL via INTRAVENOUS

## 2018-04-23 MED ORDER — FAMOTIDINE IN NACL 20-0.9 MG/50ML-% IV SOLN
20.0000 mg | INTRAVENOUS | Status: AC
  Administered 2018-04-24: 20 mg via INTRAVENOUS
  Filled 2018-04-23: qty 50

## 2018-04-23 NOTE — ED Triage Notes (Addendum)
Pt from home with c/o swelling around eyes and mouth and itching on hands and feet. Pt states he has hx of multiple allergic reactions. Pt has clear airway at this time. Pt has had multiple allergic reactions and many allergens. Pt took 1 PO benadryl PTA

## 2018-04-23 NOTE — Telephone Encounter (Signed)
Fill one time only. Pt. Will need an office visit in December.

## 2018-04-24 ENCOUNTER — Ambulatory Visit: Payer: PPO | Admitting: Oncology

## 2018-04-24 DIAGNOSIS — T7840XA Allergy, unspecified, initial encounter: Secondary | ICD-10-CM | POA: Diagnosis not present

## 2018-04-24 NOTE — ED Provider Notes (Signed)
[facility] DEPT Provider Note   CSN: [id number] Arrival date & time: 04/23/18  2335     History   Chief Complaint Chief Complaint  Patient presents with  . Allergic Reaction    HPI Steven [name] is a 37 y.o. male.  The history is provided by the patient and medical records.  Allergic Reaction     37 y.o. M with hx of HIV, HTN, syphilis, DM, asthma, presenting to the ED for allergic reaction.  Patient reports he has history of numerous allergic reactions in the past, states the smallest thing can set it off-- perfume, cleaning solution, etc. states he has not recently changed any soaps, detergents, or other personal care products.  He has not had any new foods or medications.  States he feels puffiness around his eyes, itching of his palms, and tingling of his tongue.  No difficulty swallowing or sensation of throat closing or difficulty swallowing.  No chest pain or SOB.  Did take 1 tablet of benadryl prior to arrival.  Past Medical History:  Diagnosis Date  . Asthma   . Cancer ([place])   . Diabetes mellitus without complication ([place])   . HIV positive ([place]) 03/23/09   Genotype [id number]  . HTN (hypertension)   . Kaposi's sarcoma   . Syphilis 03/23/09-   1:2    Patient Active Problem List   Diagnosis Date Noted  . AIDS ([place]) 02/09/2018  . GERD (gastroesophageal reflux disease) 03/28/2017  . Asthma 03/28/2017  . Type 2 diabetes mellitus ([place]) 03/25/2015  . CKD (chronic kidney disease) stage 3, GFR 30-59 ml/min (HCC) 03/23/2015  . Oral thrush 03/23/2015  . Essential hypertension 11/24/2014  . Post-nasal drip 02/05/2014  . 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 Arthralgia of elbow, right 07/29/2013  . Colitis 05/27/2011  . 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 Human immunodeficiency virus (HIV) disease ([place]) 04/10/2009  . Lymphedema 04/10/2009  . SYPHILIS  04/06/2009    Past Surgical History:  Procedure Laterality Date  . BRAIN SURGERY    . IR GENERIC HISTORICAL  02/12/2016   IR REMOVAL TUN ACCESS W/ PORT W/O FL MOD SED 02/12/2016 [name], MD [facility]-INTERV RAD        Home Medications    Prior to Admission medications   Medication Sig Start Date End Date Taking? Authorizing Provider  albuterol (PROVENTIL HFA;VENTOLIN HFA) 108 (90 Base) MCG/ACT inhaler INHALE 2 PUFFS BY MOUTH EVERY 4 TO 6 HOURS AS NEEDED FOR COUGH OR WHEEZING 04/23/18   Kennith Gain, MD  amLODipine (NORVASC) 10 MG tablet TAKE 1 TABLET(10 MG) BY MOUTH DAILY for blood pressure. 03/23/18   Pleas [name], NP  esomeprazole (NEXIUM) 40 MG capsule TAKE ONE CAPSULE BY MOUTH ONCE DAILY AS DIRECTED Patient taking differently: Take 40 mg by mouth daily.  02/06/17   Kozlow, Donnamarie Poag, MD  fexofenadine (ALLEGRA) 180 MG tablet Take 1 tablet (180 mg total) by mouth daily. 05/03/12   Harden Mo, MD  fluticasone (FLONASE) 50 MCG/ACT nasal spray Place 2 sprays into both nostrils daily. 02/08/18   Kennith Gain, MD  fluticasone (FLOVENT HFA) 110 MCG/ACT inhaler Inhale 2 puffs into the lungs 2 (two) times daily. 02/08/18   Kennith Gain, MD  fluticasone-salmeterol (ADVAIR HFA) [phone] MCG/ACT inhaler Inhale two puffs twice daily to prevent cough or wheeze Patient taking differently: Inhale 2 puffs into the lungs 2 (two) times daily.  02/08/18  Kennith Gain, MD  hydrochlorothiazide (HYDRODIURIL) 25 MG tablet Take 1 tablet by mouth once daily for blood pressure. 03/23/18   Pleas [name], NP  LANCETS MICRO THIN 33G MISC USE TO TEST BLOOD SUGAR ONCE A DAY 08/21/17   Tresa Garter, MD  levothyroxine (SYNTHROID, LEVOTHROID) 150 MCG tablet TAKE 1 TABLET BY MOUTH DAILY Patient taking differently: Take 150 mcg by mouth daily before breakfast. Take 1 tablet by mouth daily. 01/02/18   Elayne Snare, MD  metFORMIN (GLUCOPHAGE-XR) 500 MG 24 hr tablet Take 2  tablets in the morning and 1 tablet in the evening. 03/23/18   Pleas [name], NP  montelukast (SINGULAIR) 10 MG tablet Take 1 tablet (10 mg total) by mouth daily. 02/08/18   Kennith Gain, MD  Multiple Vitamin (MULTIVITAMIN) tablet Take 1 tablet by mouth daily.    [provider]  PAZEO 0.7 % SOLN Place 1 drop into both eyes daily. 02/08/18   Kennith Gain, MD  potassium chloride SA (K-DUR,KLOR-CON) 20 MEQ tablet Take 1 tablet (20 mEq total) by mouth daily. 03/23/18   Pleas [name], NP  potassium chloride SA (K-DUR,KLOR-CON) 20 MEQ tablet Take 1 tablet (20 mEq total) by mouth 2 (two) times daily. 03/23/18   Pleas [name], NP  predniSONE (DELTASONE) 50 MG tablet Take 1 tablet (50 mg total) by mouth daily. 04/06/18   Wynona Luna, MD  TRIUMEQ [phone] MG tablet TAKE 1 TABLET BY MOUTH EVERY DAY 03/08/18   Campbell Riches, MD  TRUE METRIX BLOOD GLUCOSE TEST test strip USE AS DIRECTED FOR THREE TIMES DAILY TESTING OF BLOOD GLUCOSE 12/18/17   Pleas [name], NP    Family History Family History  Problem Relation Age of Onset  . Pancreatic cancer Mother   . Cancer Mother   . Diabetes Paternal Grandmother   . Thyroid disease Paternal Grandmother     Social History Social History   Tobacco Use  . Smoking status: Never Smoker  . Smokeless tobacco: Never Used  Substance Use Topics  . Alcohol use: Yes    Alcohol/week: 0.0 standard drinks    Comment: socially - less now  . Drug use: No     Allergies   Lisinopril; Penicillins; Levaquin [levofloxacin in d5w]; Tessalon [benzonatate]; and Aspirin   Review of Systems Review of Systems  Constitutional:       Allergic reaction  All other systems reviewed and are negative.    Physical Exam Updated Vital Signs BP (!) 141/96 (BP Location: Left Arm)   Pulse 90   Temp 99 F (37.2 C)   SpO2 100%   Physical Exam  Constitutional: He is oriented to person, place, and time. He appears  well-developed and well-nourished.  HENT:  Head: Normocephalic and atraumatic.  Mouth/Throat: Oropharynx is clear and moist.  Some puffiness around this eyes, reports tingling around the lips but no visible swelling of lips/tongue, airway patient, handling secretions well, normal phonation without stridor  Eyes: Pupils are equal, round, and reactive to light. Conjunctivae and EOM are normal.  Neck: Normal range of motion.  Cardiovascular: Normal rate, regular rhythm and normal heart sounds.  Pulmonary/Chest: Effort normal and breath sounds normal. No stridor. No respiratory distress.  Abdominal: Soft. Bowel sounds are normal. There is no tenderness. There is no rebound.  Musculoskeletal: Normal range of motion.  Neurological: He is alert and oriented to person, place, and time.  Skin: Skin is warm and dry. No rash noted.  No rash,  some redness of the palms from scratching  Psychiatric: He has a normal mood and affect.  Nursing note and vitals reviewed.    ED Treatments / Results  Labs (all labs ordered are listed, but only abnormal results are displayed) Labs Reviewed - No data to display  EKG None  Radiology No results found.  Procedures Procedures (including critical care time)  Medications Ordered in ED Medications  methylPREDNISolone sodium succinate (SOLU-MEDROL) 125 mg/2 mL injection 125 mg (125 mg Intravenous Given 04/24/18 0025)  diphenhydrAMINE (BENADRYL) injection 25 mg (25 mg Intravenous Given 04/24/18 0025)  famotidine (PEPCID) IVPB 20 mg premix (0 mg Intravenous Stopped 04/24/18 0115)  sodium chloride 0.9 % bolus 1,000 mL (1,000 mLs Intravenous New Bag/Given 04/24/18 0028)     Initial Impression / Assessment and Plan / ED Course  I have reviewed the triage vital signs and the nursing notes.  Pertinent labs & imaging results that were available during my care of the patient were reviewed by me and considered in my medical decision making (see chart for  details).  37 y.o. M here with allergic reaction to unknown agent, history of same multiple times in the past.  No new soaps, detergents, personal care products, medications or foods.  Reports tingling around the mouth, itching, and some puffiness around the eyes.  Denies any sensation of throat closing or difficulty swallowing.  No chest pain or shortness of breath.  No visible rash on exam.  Airway is patent, handling secretions well.  Normal phonation without stridor.  Does have some redness of the palms from scratching.  Will give IV Benadryl, Solu-Medrol, Pepcid, and IV fluids.  Will monitor closely.  Patient feeling much better after IV medications here.  States all tingling around his mouth and itching has resolved.  Remains without airway compromise.  Vitals are stable.  He appears stable for discharge.  We will follow-up closely with his primary care doctor.  He will return here for any new or worsening symptoms.  Final Clinical Impressions(s) / ED Diagnoses   Final diagnoses:  Allergic reaction, initial encounter    ED Discharge Orders    None       Larene Pickett, PA-C 04/24/18 0234    Ripley Fraise, MD 04/24/18 [phone]

## 2018-04-24 NOTE — ED Notes (Signed)
Denies SOB throat swelling or oral edema is alert x3 resting comfortably

## 2018-04-24 NOTE — Discharge Instructions (Signed)
Follow-up with your primary care doctor. Return here for any new/acute changes.

## 2018-04-24 NOTE — ED Provider Notes (Signed)
Patient seen/examined in the Emergency Department in conjunction with Midlevel Provider [facility]  Patient reports allergic reaction Exam : Awake alert, mild facial swelling Plan: We will treat for allergic reaction/reassess   Ripley Fraise, MD 04/24/18 0116

## 2018-04-25 ENCOUNTER — Ambulatory Visit (INDEPENDENT_AMBULATORY_CARE_PROVIDER_SITE_OTHER): Payer: PPO | Admitting: Infectious Diseases

## 2018-04-25 ENCOUNTER — Encounter: Payer: Self-pay | Admitting: Infectious Diseases

## 2018-04-25 VITALS — BP 116/77 | HR 77 | Temp 98.3°F

## 2018-04-25 DIAGNOSIS — C469 Kaposi's sarcoma, unspecified: Secondary | ICD-10-CM | POA: Diagnosis not present

## 2018-04-25 DIAGNOSIS — N183 Chronic kidney disease, stage 3 unspecified: Secondary | ICD-10-CM

## 2018-04-25 DIAGNOSIS — Z79899 Other long term (current) drug therapy: Secondary | ICD-10-CM

## 2018-04-25 DIAGNOSIS — Z23 Encounter for immunization: Secondary | ICD-10-CM | POA: Diagnosis not present

## 2018-04-25 DIAGNOSIS — E1122 Type 2 diabetes mellitus with diabetic chronic kidney disease: Secondary | ICD-10-CM | POA: Diagnosis not present

## 2018-04-25 DIAGNOSIS — N182 Chronic kidney disease, stage 2 (mild): Secondary | ICD-10-CM

## 2018-04-25 DIAGNOSIS — Z113 Encounter for screening for infections with a predominantly sexual mode of transmission: Secondary | ICD-10-CM | POA: Diagnosis not present

## 2018-04-25 DIAGNOSIS — B20 Human immunodeficiency virus [HIV] disease: Secondary | ICD-10-CM

## 2018-04-25 NOTE — Progress Notes (Signed)
   Subjective:    Patient ID: Steven [name], male    DOB: July 11, 1981, 37 y.o.   MRN: [id number]  HPI 37yo M with hx of AIDS, KS.He is being monitored for his KS by Onc.  He was previously on CTX however in 2011 he had an intracranial bleed.   He was changed from TRV/ISN in 2014 to triumeq. He had f/u with Onc on 05-23-16, was felt to be doing well. He also has been seen by FP and endo for hypothyroidism and DM2.  CKD 1 Just out of hospital for severe allergic. Is now on prednisone. Has increased his GLc. Has had facial, periorbital edema. 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    HIV 1 RNA Quant (copies/mL)  Date Value  04/11/2018 <20 NOT DETECTED  05/24/2017 21 (H)  11/16/2016 <20 NOT DETECTED   CD4 T Cell Abs (/uL)  Date Value  04/11/2018 550  05/24/2017 560  11/16/2016 510    Review of Systems  Constitutional: Negative for appetite change and unexpected weight change.  Eyes: Negative for visual disturbance.  Respiratory: Negative for shortness of breath and wheezing.   Cardiovascular: Positive for leg swelling.  Gastrointestinal: Negative for constipation and diarrhea.  Genitourinary: Negative for difficulty urinating.  getting exercise at [facility] Wt down.  Please see HPI. All other systems reviewed and negative.      Objective:   Physical Exam  Constitutional: He is oriented to person, place, and time. He appears well-developed and well-nourished.  HENT:  Mouth/Throat: No oropharyngeal exudate.  Eyes: Pupils are equal, round, and reactive to light. EOM are normal.  Neck: Normal range of motion. Neck supple.  Cardiovascular: Normal rate, regular rhythm and normal heart sounds.  Pulmonary/Chest: Breath sounds normal. No respiratory distress. He has no wheezes.  Abdominal: Soft. Bowel sounds are normal. He exhibits no distension. There is no tenderness.  Musculoskeletal: He exhibits edema.  Lymphadenopathy:    He has no cervical adenopathy.  Neurological: He is  alert and oriented to person, place, and time.      Assessment & Plan:

## 2018-04-25 NOTE — Assessment & Plan Note (Signed)
He is doing well His last Glc was 2012 Some concern about recent steroid burst.

## 2018-04-25 NOTE — Assessment & Plan Note (Signed)
Has f/u appt next week.

## 2018-04-25 NOTE — Assessment & Plan Note (Signed)
His Cr is stable. Will continue to monitor.

## 2018-04-25 NOTE — Progress Notes (Signed)
Patient received CBULAGT-36 in office today, tolerated well.

## 2018-04-25 NOTE — Assessment & Plan Note (Signed)
Doing very well Will give PCV 13 today Has had flu vax Continue triumeq Offered/refused condoms.  rtc in 9 months

## 2018-04-25 NOTE — Addendum Note (Signed)
Addended by: Eugenia Mcalpine on: 04/25/2018 11:28 AM   Modules accepted: Orders

## 2018-04-30 ENCOUNTER — Inpatient Hospital Stay: Payer: PPO | Admitting: Oncology

## 2018-05-10 ENCOUNTER — Ambulatory Visit (INDEPENDENT_AMBULATORY_CARE_PROVIDER_SITE_OTHER): Payer: PPO | Admitting: *Deleted

## 2018-05-10 DIAGNOSIS — J4550 Severe persistent asthma, uncomplicated: Secondary | ICD-10-CM

## 2018-05-13 ENCOUNTER — Other Ambulatory Visit: Payer: Self-pay | Admitting: Allergy

## 2018-05-22 ENCOUNTER — Other Ambulatory Visit: Payer: Self-pay | Admitting: Primary Care

## 2018-05-22 DIAGNOSIS — I10 Essential (primary) hypertension: Secondary | ICD-10-CM

## 2018-05-24 ENCOUNTER — Ambulatory Visit: Payer: PPO

## 2018-06-06 ENCOUNTER — Other Ambulatory Visit: Payer: Self-pay | Admitting: Infectious Diseases

## 2018-06-06 DIAGNOSIS — B20 Human immunodeficiency virus [HIV] disease: Secondary | ICD-10-CM

## 2018-06-08 ENCOUNTER — Ambulatory Visit: Payer: PPO | Admitting: Podiatry

## 2018-06-14 ENCOUNTER — Ambulatory Visit (INDEPENDENT_AMBULATORY_CARE_PROVIDER_SITE_OTHER): Payer: PPO | Admitting: Allergy

## 2018-06-14 ENCOUNTER — Inpatient Hospital Stay: Payer: PPO | Attending: Oncology | Admitting: Oncology

## 2018-06-14 ENCOUNTER — Ambulatory Visit: Payer: PPO | Admitting: Allergy

## 2018-06-14 VITALS — BP 137/81 | HR 75 | Temp 98.1°F | Resp 19 | Ht 73.0 in | Wt 280.3 lb

## 2018-06-14 VITALS — BP 120/76 | HR 80 | Resp 18

## 2018-06-14 DIAGNOSIS — Z91018 Allergy to other foods: Secondary | ICD-10-CM

## 2018-06-14 DIAGNOSIS — H1010 Acute atopic conjunctivitis, unspecified eye: Secondary | ICD-10-CM

## 2018-06-14 DIAGNOSIS — B20 Human immunodeficiency virus [HIV] disease: Secondary | ICD-10-CM | POA: Diagnosis not present

## 2018-06-14 DIAGNOSIS — J4550 Severe persistent asthma, uncomplicated: Secondary | ICD-10-CM

## 2018-06-14 DIAGNOSIS — Z79899 Other long term (current) drug therapy: Secondary | ICD-10-CM | POA: Insufficient documentation

## 2018-06-14 DIAGNOSIS — J3089 Other allergic rhinitis: Secondary | ICD-10-CM

## 2018-06-14 DIAGNOSIS — E039 Hypothyroidism, unspecified: Secondary | ICD-10-CM

## 2018-06-14 DIAGNOSIS — I10 Essential (primary) hypertension: Secondary | ICD-10-CM | POA: Diagnosis not present

## 2018-06-14 DIAGNOSIS — K219 Gastro-esophageal reflux disease without esophagitis: Secondary | ICD-10-CM | POA: Diagnosis not present

## 2018-06-14 DIAGNOSIS — B370 Candidal stomatitis: Secondary | ICD-10-CM | POA: Diagnosis not present

## 2018-06-14 DIAGNOSIS — C469 Kaposi's sarcoma, unspecified: Secondary | ICD-10-CM | POA: Diagnosis not present

## 2018-06-14 DIAGNOSIS — J302 Other seasonal allergic rhinitis: Secondary | ICD-10-CM

## 2018-06-14 DIAGNOSIS — I890 Lymphedema, not elsewhere classified: Secondary | ICD-10-CM | POA: Diagnosis not present

## 2018-06-14 DIAGNOSIS — E038 Other specified hypothyroidism: Secondary | ICD-10-CM | POA: Diagnosis not present

## 2018-06-14 DIAGNOSIS — T7840XD Allergy, unspecified, subsequent encounter: Secondary | ICD-10-CM

## 2018-06-14 MED ORDER — NYSTATIN 100000 UNIT/ML MT SUSP: OROMUCOSAL | 0 refills | Status: DC

## 2018-06-14 NOTE — Progress Notes (Signed)
Follow Up Note  RE: Steven [name] MRN: [id number] DOB: 1981/03/30 Date of Office Visit: 06/14/2018  Referring provider: Pleas [name], NP Primary [name] provider: Pleas [name], NP  Chief Complaint: Asthma (last 4-5 weeks maybe used albuterol 2 times. doing very well overall. ) and Allergies (he has had 2-3 ED visits due to anaphylaxis at work (food related))  History of Present Illness: I had the pleasure of seeing Steven [name] for a follow up visit at the [facility] on 06/15/2018. He is a 37 y.o. male, who is being followed for asthma, allergic rhino conjunctivitis, food allergy, GERD. Today he is here for regular follow up visit. His previous allergy office visit was on 02/08/2018 with Dr. Nelva Bush.   Moderate persistent asthma Currently on Advair 230 2 puffs BID, Qvar 80 1 puff QD and using albuterol about once a week. Still taking Singulair daily and also on Xolair 300mg  every 2 weeks for a few years but lately has not been coming on time. He hasn't noticed a difference if he doesn't come in on time but definitely thinks the Xolair has been helping him.   Denies any SOB, coughing, wheezing, chest tightness, nocturnal awakenings, ER/urgent [name] visits or prednisone use since the last visit.  Noticed some whiteness of the mouth/tongue. No pain associated with this.   Allergic rhinitis with conjunctivitis Currently on Flonase 1 spray once a day, allegra as needed and eye drops as needed with good benefit.     GERD Controlled on Nexium 40mg  daily.   Food allergy/Allergic reactions Currently avoiding shellfish and mushrooms and think he may have had accidental exposures to it since the last visit. He went to the ER 3 times for allergic reactions.  02/19/2018 - Patient went out to eat at a restaurant at [facility] and he believes he had a cross contamination with shellfish. Developed swelling around his face and eyes. He was given epi, benadryl, Pepcid, and  steroids in the ER with good benefit.  04/06/2018 - patient had another episode and earlier that day he received Xolair. He went to urgent [name] and was given sterids and benadryl. He had periorbital swelling, swelling of the upper lip and some speech distortion.   04/23/2018 - patient to the ER for periorbital swelling and itching of his palms. Treated with steroids, benadryl and Pepcid.  The last 2 episodes patient is not sure what may have set it off as he denies any changes in diet, medications, personal [name] products or infections. He infrequently eats red meat. He does have access to epinephrine at home if needed.  Assessment and Plan: Steven [name] is a 37 y.o. male with: Asthma, severe persistent, well-controlled Well-controlled with below regimen.  Today's spirometry showed: possible restrictive disease, slightly improved from previous one. . Daily controller medication(s): Advair 230 2 puffs twice a day with spacer and rinse mouth afterwards + Xolair 300mg  every 14 days  . Prior to physical activity: May use albuterol rescue inhaler 2 puffs 5 to 15 minutes prior to strenuous physical activities. Marland Kitchen Rescue medications: May use albuterol rescue inhaler 2 puffs or nebulizer every 4 to 6 hours as needed for shortness of breath, chest tightness, coughing, and wheezing. Monitor frequency of use.  . During upper respiratory infections: Start Qvar 80 2 puffs twice a day for 1-2 weeks.  Thrush, oral Start nystatin swish and spit.  Advised patient to use inhalers with spacer and rinse mouth afterwards.  Seasonal and perennial allergic rhinoconjunctivitis  Past history - 2015 skin testing was positive to grass, ragweed, weed, mold, dust mites, cat and dog.  Take Flonase 1-2 sprays daily  May use over the counter antihistamines such as Zyrtec (cetirizine), Claritin (loratadine), Allegra (fexofenadine), or Xyzal (levocetirizine) daily as needed.  May use pazeo eye drops as needed.   Food  allergy Currently avoiding shellfish and mushroom. May have had cross contamination reaction when he went to [employer]  Continue to avoid shellfish and mushroom  For mild symptoms you can take over the counter antihistamines such as Benadryl and monitor symptoms closely. If symptoms worsen or if you have severe symptoms including breathing issues, throat closure, significant swelling, whole body hives, severe diarrhea and vomiting, lightheadedness then inject epinephrine and seek immediate medical [name] afterwards.  Allergic reaction 3 allergic reactions since the last visit. One episode most likely due to shellfish contact but other 2 episodes no triggers noted.  Get bloodwork as below.  Get tryptase level within 2-3 hours of reaction.   One episode occurred hours after Xolair injection but patient had Xolair injection since then with no issues. If there are additional reactions then will have to discuss whether Xolair could be triggering these events as well.  For mild symptoms you can take over the counter antihistamines such as Benadryl and monitor symptoms closely. If symptoms worsen or if you have severe symptoms including breathing issues, throat closure, significant swelling, whole body hives, severe diarrhea and vomiting, lightheadedness then inject epinephrine and seek immediate medical [name] afterwards.  GERD (gastroesophageal reflux disease) Continue Nexium 40mg  daily.  Return in about 2 months (around 08/15/2018).  Meds ordered this encounter  Medications  . nystatin (MYCOSTATIN) 100000 UNIT/ML suspension    Sig: 5 ml by mouth 4 times daily. swish and swallow or spit    Dispense:  110 mL    Refill:  0    Lab Orders     Tryptase     Alpha-Gal Panel     Allergen Profile, Basic Food     Allergen Profile, Shellfish     C4 complement     CBC with Differential/Platelet     Tryptase  Diagnostics: Spirometry:  Tracings reviewed. His effort: Good reproducible  efforts. FVC: 3.24L FEV1: 2.22L, 61% predicted FEV1/FVC ratio: 69% Interpretation: Spirometry consistent with possible restrictive disease, slightly improved from previous one. Please see scanned spirometry results for details.  Medication List:  Current Outpatient Medications  Medication Sig Dispense Refill  . albuterol (PROVENTIL HFA;VENTOLIN HFA) 108 (90 Base) MCG/ACT inhaler INHALE 2 PUFFS BY MOUTH EVERY 4 TO 6 HOURS AS NEEDED FOR COUGH OR WHEEZING 8.5 g 1  . amLODipine (NORVASC) 10 MG tablet TAKE 1 TABLET(10 MG) BY MOUTH DAILY FOR BLOOD PRESSURE 90 tablet 1  . esomeprazole (NEXIUM) 40 MG capsule TAKE ONE CAPSULE BY MOUTH ONCE DAILY AS DIRECTED (Patient taking differently: Take 40 mg by mouth daily. ) 30 capsule 0  . fexofenadine (ALLEGRA) 180 MG tablet Take 1 tablet (180 mg total) by mouth daily. 15 tablet 0  . fluticasone (FLONASE) 50 MCG/ACT nasal spray Place 2 sprays into both nostrils daily. 16 g 5  . fluticasone-salmeterol (ADVAIR HFA) 230-21 MCG/ACT inhaler Inhale two puffs twice daily to prevent cough or wheeze (Patient taking differently: Inhale 2 puffs into the lungs 2 (two) times daily. ) 12 g 5  . hydrochlorothiazide (HYDRODIURIL) 25 MG tablet Take 1 tablet by mouth once daily for blood pressure. 90 tablet 3  . LANCETS MICRO THIN 33G MISC  USE TO TEST BLOOD SUGAR ONCE A DAY 100 each 2  . levothyroxine (SYNTHROID, LEVOTHROID) 150 MCG tablet TAKE 1 TABLET BY MOUTH DAILY (Patient taking differently: Take 150 mcg by mouth daily before breakfast. Take 1 tablet by mouth daily.) 30 tablet 0  . metFORMIN (GLUCOPHAGE-XR) 500 MG 24 hr tablet Take 2 tablets in the morning and 1 tablet in the evening. 270 tablet 3  . montelukast (SINGULAIR) 10 MG tablet Take 1 tablet (10 mg total) by mouth daily. 30 tablet 5  . Multiple Vitamin (MULTIVITAMIN) tablet Take 1 tablet by mouth daily.    Marland Kitchen PAZEO 0.7 % SOLN Place 1 drop into both eyes daily. 1 Bottle 5  . potassium chloride SA (K-DUR,KLOR-CON) 20  MEQ tablet Take 1 tablet (20 mEq total) by mouth daily. 90 tablet 3  . TRIUMEQ 600-50-300 MG tablet TAKE 1 TABLET BY MOUTH EVERY DAY 30 tablet 6  . TRUE METRIX BLOOD GLUCOSE TEST test strip USE AS DIRECTED FOR THREE TIMES DAILY TESTING OF BLOOD GLUCOSE 300 each 2  . nystatin (MYCOSTATIN) 100000 UNIT/ML suspension 5 ml by mouth 4 times daily. swish and swallow or spit 110 mL 0   Current Facility-Administered Medications  Medication Dose Route Frequency Provider Last Rate Last Dose  . omalizumab Arvid Right) injection 300 mg  300 mg Subcutaneous Q14 Days Jiles Prows, MD   300 mg at 06/14/18 1839   Allergies: Allergies  Allergen Reactions  . Lisinopril Swelling    Swelling of lower lip while on lisinopril  . Penicillins Hives and Swelling    REACTION: rash and swelling  . Levaquin [Levofloxacin In D5w] Rash  . Tessalon [Benzonatate] Rash  . Aspirin Nausea Only   I reviewed his past medical history, social history, family history, and environmental history and no significant changes have been reported from previous visit on 02/08/2018.  Review of Systems  Constitutional: Negative for appetite change, chills, fever and unexpected weight change.  HENT: Negative for congestion and rhinorrhea.   Eyes: Negative for itching.  Respiratory: Negative for cough, chest tightness, shortness of breath and wheezing.   Gastrointestinal: Negative for abdominal pain.  Skin: Negative for rash.  Allergic/Immunologic: Positive for environmental allergies and food allergies.  Neurological: Negative for headaches.   Objective: BP 120/76 (BP Location: Left Arm, Patient Position: Sitting, Cuff Size: Normal)   Pulse 80   Resp 18   SpO2 98%  There is no height or weight on file to calculate BMI. Physical Exam  Constitutional: He is oriented to person, place, and time. He appears well-developed and well-nourished.  HENT:  Head: Normocephalic and atraumatic.  Right Ear: External ear normal.  Left Ear:  External ear normal.  Nose: Nose normal.  Mouth/Throat: Oropharynx is clear and moist.  Eyes: Conjunctivae and EOM are normal.  Neck: Neck supple.  Cardiovascular: Normal rate, regular rhythm and normal heart sounds. Exam reveals no gallop and no friction rub.  No murmur heard. Pulmonary/Chest: Effort normal and breath sounds normal. He has no wheezes. He has no rales.  Lymphadenopathy:    He has no cervical adenopathy.  Neurological: He is alert and oriented to person, place, and time.  Skin: Skin is warm. No rash noted.  Psychiatric: He has a normal mood and affect. His behavior is normal.  Nursing note and vitals reviewed.  Previous notes and tests were reviewed. The plan was reviewed with the patient/family, and all questions/concerned were addressed.  It was my pleasure to see [place] today and participate in  his [name]. Please feel free to contact me with any questions or concerns.  Sincerely,  Rexene Alberts, DO Allergy & Immunology  [facility] office: [phone] [place]

## 2018-06-14 NOTE — Progress Notes (Signed)
[facility] OFFICE PROGRESS NOTE   Diagnosis: Kaposi's sarcoma  INTERVAL HISTORY:   Steven [name] returns for a scheduled visit.  He feels well.  He continues follow-up with Dr. Johnnye Sima for management of HIV infection.  The HIV RNA returned undetectable in October. He reports to new hyperpigmented skin nodules at the left lower leg.  Other nodules remain stable.  No pain.  Good appetite.  He reports intentional weight loss.  He would like to resume treatment at the lymphedema clinic for management of chronic leg edema.  Objective:  Vital signs in last 24 hours:  Blood pressure 137/81, pulse 75, temperature 98.1 F (36.7 C), temperature source Oral, resp. rate 19, height 6\' 1"  (1.854 m), weight 280 lb 4.8 oz (127.1 kg), SpO2 100 %.    HEENT: Mild white coat over the tongue, no buccal thrush Lymphatics: No cervical, supraclavicular, axillary, or inguinal nodes Resp: Lungs clear bilaterally Cardio: Regular rate and rhythm GI: No hepatosplenomegaly, nontender, no mass Vascular: Chronic stasis change of the lower leg bilaterally  Skin: Dark hyperpigmented round raised nodular lesions measuring from 0.5-1 cm over the arms, left thigh, and left lower leg    Lab Results:  Lab Results  Component Value Date   WBC 6.6 04/11/2018   HGB 15.9 04/11/2018   HCT 49.5 04/11/2018   MCV 78.9 (L) 04/11/2018   PLT 277 04/11/2018   NEUTROABS 3.2 07/13/2017    CMP  Lab Results  Component Value Date   NA 140 04/11/2018   K 3.4 (L) 04/11/2018   CL 96 (L) 04/11/2018   CO2 31 04/11/2018   GLUCOSE 124 (H) 04/11/2018   BUN 20 04/11/2018   CREATININE 1.47 (H) 04/11/2018   CALCIUM 9.9 04/11/2018   PROT 7.4 04/11/2018   ALBUMIN 4.4 07/13/2017   AST 15 04/11/2018   ALT 29 04/11/2018   ALKPHOS 58 07/13/2017   BILITOT 1.0 04/11/2018   GFRNONAA >60 07/13/2017   GFRAA >60 07/13/2017     Medications: I have reviewed the patient's current  medications.   Assessment/Plan: 1. 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   Status post Doxil (5 cycles 04/29/2009 through 09/22/2009 and then 3 cycles of paclitaxel 12/01/2009 through 02/09/2010.   Treatment subsequently placed on hold due to bilateral subdural hematomas.   CT scans 10/28/2013 showed a new prevascular mass with PET scan showing mild low level FDG uptake in the anterior mediastinal mass with more pronounced FDG accumulation within the dominant mesenteric lymph node.   Restaging PET scan 03/13/2014 showed a decrease in low level FDG uptake associated with the prevascular mass; similar degree of FDG uptake associated with small mesenteric lymph nodes; persistent increased FDG uptake associated with the diffuse lytic bone metastasis. CT scan also 03/13/2014 showed interval decrease in the size of the anterior mediastinal mass, unchanged multiple small bowel mesenteric lymph nodes and re-demonstrated widespread lytic osseous metastasis. 2. HIV/AIDS followed by Dr. Johnnye Sima. He continues Abacavir-Dolutegravir-Lamivud.  3. History of bilateral subdural hematomas status post evacuation 03/05/2010. 4. Hypertension. 5. Renal dysfunction. 6. Asthma. 7. History of hyperthyroidism status post radioactive iodine November 2012 with subsequent hypothyroidism now on Synthroid. 8. Port-A-Cath placement 10/14/2009. 9. Lower extremity edema, bilateral, question lymphedema. He has been evaluated at the lymphedema clinic. 10. Skin lesions. Status post evaluation by dermatology.    Disposition: [name] remains in clinical remission from  Kaposi's sarcoma.  He is followed closely by his primary physician and Dr. Johnnye Sima.  He will return for an office visit in 1 year.  We will follow-up on the dermatology report from the skin  nodule biopsy.  [name] will be referred to the lymphedema clinic for evaluation and management of leg edema.  15 minutes were spent with the patient today.  The majority of the time was used for counseling and coordination of care.  Betsy Coder, MD  06/14/2018  11:37 AM

## 2018-06-14 NOTE — Patient Instructions (Addendum)
Get bloodwork  . Daily controller medication(s): Advair 230 2 puffs twice a day with spacer and rinse mouth afterwards + Xolair 300mg  every 14 days  . Prior to physical activity: May use albuterol rescue inhaler 2 puffs 5 to 15 minutes prior to strenuous physical activities. Marland Kitchen Rescue medications: May use albuterol rescue inhaler 2 puffs or nebulizer every 4 to 6 hours as needed for shortness of breath, chest tightness, coughing, and wheezing. Monitor frequency of use.  . During upper respiratory infections: Start Qvar 80 2 puffs twice a day for 1-2 weeks  . Asthma control goals:  o Full participation in all desired activities (may need albuterol before activity) o Albuterol use two times or less a week on average (not counting use with activity) o Cough interfering with sleep two times or less a month o Oral steroids no more than once a year o No hospitalizations  Continue to avoid shellfish and mushroom For mild symptoms you can take over the counter antihistamines such as Benadryl and monitor symptoms closely. If symptoms worsen or if you have severe symptoms including breathing issues, throat closure, significant swelling, whole body hives, severe diarrhea and vomiting, lightheadedness then inject epinephrine and seek immediate medical care afterwards.  Get tryptase level within 2-3 hours of allergic reactions  Continue Nexium 40mg  daily   Allergic rhinitis: Take Flonase 1-2 sprays daily May use over the counter antihistamines such as Zyrtec (cetirizine), Claritin (loratadine), Allegra (fexofenadine), or Xyzal (levocetirizine) daily as needed. May use eye drops as needed.   Follow up in 2 months

## 2018-06-15 ENCOUNTER — Encounter: Payer: Self-pay | Admitting: Allergy

## 2018-06-15 DIAGNOSIS — J3089 Other allergic rhinitis: Secondary | ICD-10-CM

## 2018-06-15 DIAGNOSIS — H1010 Acute atopic conjunctivitis, unspecified eye: Secondary | ICD-10-CM | POA: Insufficient documentation

## 2018-06-15 DIAGNOSIS — J4550 Severe persistent asthma, uncomplicated: Secondary | ICD-10-CM | POA: Insufficient documentation

## 2018-06-15 DIAGNOSIS — T7840XA Allergy, unspecified, initial encounter: Secondary | ICD-10-CM | POA: Insufficient documentation

## 2018-06-15 DIAGNOSIS — J302 Other seasonal allergic rhinitis: Secondary | ICD-10-CM

## 2018-06-15 DIAGNOSIS — Z91018 Allergy to other foods: Secondary | ICD-10-CM | POA: Insufficient documentation

## 2018-06-15 NOTE — Assessment & Plan Note (Signed)
-   Continue Nexium 40mg daily 

## 2018-06-15 NOTE — Assessment & Plan Note (Signed)
Currently avoiding shellfish and mushroom. May have had cross contamination reaction when he went to [employer]  Continue to avoid shellfish and mushroom  For mild symptoms you can take over the counter antihistamines such as Benadryl and monitor symptoms closely. If symptoms worsen or if you have severe symptoms including breathing issues, throat closure, significant swelling, whole body hives, severe diarrhea and vomiting, lightheadedness then inject epinephrine and seek immediate medical care afterwards.

## 2018-06-15 NOTE — Assessment & Plan Note (Addendum)
Past history - 2015 skin testing was positive to grass, ragweed, weed, mold, dust mites, cat and dog.  Take Flonase 1-2 sprays daily  May use over the counter antihistamines such as Zyrtec (cetirizine), Claritin (loratadine), Allegra (fexofenadine), or Xyzal (levocetirizine) daily as needed.  May use pazeo eye drops as needed.

## 2018-06-15 NOTE — Assessment & Plan Note (Addendum)
Well-controlled with below regimen.  Today's spirometry showed: possible restrictive disease, slightly improved from previous one. . Daily controller medication(s): Advair 230 2 puffs twice a day with spacer and rinse mouth afterwards + Xolair 300mg  every 14 days  . Prior to physical activity: May use albuterol rescue inhaler 2 puffs 5 to 15 minutes prior to strenuous physical activities. Marland Kitchen Rescue medications: May use albuterol rescue inhaler 2 puffs or nebulizer every 4 to 6 hours as needed for shortness of breath, chest tightness, coughing, and wheezing. Monitor frequency of use.  . During upper respiratory infections: Start Qvar 80 2 puffs twice a day for 1-2 weeks.

## 2018-06-15 NOTE — Assessment & Plan Note (Signed)
3 allergic reactions since the last visit. One episode most likely due to shellfish contact but other 2 episodes no triggers noted.  Get bloodwork as below.  Get tryptase level within 2-3 hours of reaction.   One episode occurred hours after Xolair injection but patient had Xolair injection since then with no issues. If there are additional reactions then will have to discuss whether Xolair could be triggering these events as well.  For mild symptoms you can take over the counter antihistamines such as Benadryl and monitor symptoms closely. If symptoms worsen or if you have severe symptoms including breathing issues, throat closure, significant swelling, whole body hives, severe diarrhea and vomiting, lightheadedness then inject epinephrine and seek immediate medical care afterwards.

## 2018-06-15 NOTE — Assessment & Plan Note (Addendum)
Start nystatin swish and spit.  Advised patient to use inhalers with spacer and rinse mouth afterwards.

## 2018-06-19 ENCOUNTER — Other Ambulatory Visit: Payer: Self-pay | Admitting: Allergy

## 2018-06-19 ENCOUNTER — Other Ambulatory Visit: Payer: Self-pay | Admitting: Allergy and Immunology

## 2018-06-19 LAB — ALPHA-GAL PANEL
Alpha Gal IgE*: 0.9 kU/L — ABNORMAL HIGH (ref <0.10)
BEEF (BOS SPP) IGE: 0.29 kU/L (ref <0.35)
LAMB CLASS INTERPRETATION: 0
Pork (Sus spp) IgE: 0.14 kU/L (ref <0.35)

## 2018-06-19 LAB — ALLERGEN PROFILE, BASIC FOOD
ALLERGEN CORN, IGE: 0.5 kU/L — AB
BEEF IGE: 0.28 kU/L — AB
CHOCOLATE/CACAO IGE: 0.14 kU/L — AB
EGG, WHOLE IGE: 0.44 kU/L — AB
FOOD MIX (SEAFOODS) IGE: NEGATIVE
Milk IgE: 0.26 kU/L — AB
Peanut IgE: 0.48 kU/L — AB
Pork IgE: 0.18 kU/L — AB
Soybean IgE: 0.35 kU/L — AB
Wheat IgE: 2.27 kU/L — AB

## 2018-06-19 LAB — TRYPTASE: TRYPTASE: 7.3 ug/L (ref 2.2–13.2)

## 2018-06-19 LAB — CBC WITH DIFFERENTIAL/PLATELET
Basophils Absolute: 0 10*3/uL (ref 0.0–0.2)
Basos: 1 %
EOS (ABSOLUTE): 0.1 10*3/uL (ref 0.0–0.4)
Eos: 2 %
HEMATOCRIT: 48.5 % (ref 37.5–51.0)
HEMOGLOBIN: 15.7 g/dL (ref 13.0–17.7)
Immature Grans (Abs): 0 10*3/uL (ref 0.0–0.1)
Immature Granulocytes: 1 %
LYMPHS: 42 %
Lymphocytes Absolute: 2.3 10*3/uL (ref 0.7–3.1)
MCH: 25.7 pg — ABNORMAL LOW (ref 26.6–33.0)
MCHC: 32.4 g/dL (ref 31.5–35.7)
MCV: 79 fL (ref 79–97)
MONOCYTES: 8 %
Monocytes Absolute: 0.5 10*3/uL (ref 0.1–0.9)
Neutrophils Absolute: 2.7 10*3/uL (ref 1.4–7.0)
Neutrophils: 46 %
Platelets: 244 10*3/uL (ref 150–450)
RBC: 6.12 x10E6/uL — ABNORMAL HIGH (ref 4.14–5.80)
RDW: 13.9 % (ref 12.3–15.4)
WBC: 5.6 10*3/uL (ref 3.4–10.8)

## 2018-06-19 LAB — ALLERGEN PROFILE, SHELLFISH
CLAM IGE: 0.21 kU/L — AB
F023-IgE Crab: <0.1 kU/L
F080-IgE Lobster: <0.1 kU/L
F290-IgE Oyster: 0.17 kU/L — AB
Scallop IgE: 0.26 kU/L — AB
Shrimp IgE: 0.11 kU/L — AB

## 2018-06-19 LAB — C4 COMPLEMENT: Complement C4, Serum: 27 mg/dL (ref 14–44)

## 2018-06-20 ENCOUNTER — Encounter: Payer: Self-pay | Admitting: Allergy

## 2018-06-28 ENCOUNTER — Ambulatory Visit (INDEPENDENT_AMBULATORY_CARE_PROVIDER_SITE_OTHER): Payer: PPO | Admitting: *Deleted

## 2018-06-28 DIAGNOSIS — J4550 Severe persistent asthma, uncomplicated: Secondary | ICD-10-CM | POA: Diagnosis not present

## 2018-07-09 ENCOUNTER — Telehealth: Payer: Self-pay | Admitting: Allergy

## 2018-07-09 NOTE — Telephone Encounter (Signed)
[facility] called to find out if this patient is still on Xolair. They have not shipped since October.

## 2018-07-12 ENCOUNTER — Ambulatory Visit: Payer: PPO

## 2018-07-13 ENCOUNTER — Ambulatory Visit: Payer: PPO | Admitting: Podiatry

## 2018-07-13 ENCOUNTER — Ambulatory Visit: Payer: PPO | Admitting: Rehabilitation

## 2018-07-16 ENCOUNTER — Encounter (HOSPITAL_COMMUNITY): Payer: Self-pay | Admitting: Emergency Medicine

## 2018-07-16 ENCOUNTER — Ambulatory Visit (HOSPITAL_COMMUNITY)
Admission: EM | Admit: 2018-07-16 | Discharge: 2018-07-16 | Disposition: A | Discharge status: Home / Self Care | Payer: PPO | Attending: Internal Medicine | Admitting: Internal Medicine

## 2018-07-16 DIAGNOSIS — R062 Wheezing: Secondary | ICD-10-CM | POA: Insufficient documentation

## 2018-07-16 DIAGNOSIS — J069 Acute upper respiratory infection, unspecified: Secondary | ICD-10-CM | POA: Insufficient documentation

## 2018-07-16 MED ORDER — IPRATROPIUM-ALBUTEROL 0.5-2.5 (3) MG/3ML IN SOLN
3.0000 mL | Freq: Once | RESPIRATORY_TRACT | Status: AC
  Administered 2018-07-16: 3 mL via RESPIRATORY_TRACT

## 2018-07-16 MED ORDER — GUAIFENESIN ER 600 MG PO TB12: 600.0000 mg | ORAL_TABLET | Freq: Two times a day (BID) | ORAL | 0 refills | Status: DC

## 2018-07-16 MED ORDER — PREDNISONE 10 MG PO TABS: 40.0000 mg | ORAL_TABLET | Freq: Every day | ORAL | 0 refills | Status: AC

## 2018-07-16 MED ORDER — IPRATROPIUM-ALBUTEROL 0.5-2.5 (3) MG/3ML IN SOLN
RESPIRATORY_TRACT | Status: AC
  Filled 2018-07-16: qty 3

## 2018-07-16 NOTE — ED Triage Notes (Signed)
Pt sts URI sx with asthma sx x 3 days

## 2018-07-16 NOTE — ED Provider Notes (Signed)
Steven [name]    CSN: [id number] Arrival date & time: 07/16/18  1046     History   Chief Complaint Chief Complaint  Patient presents with  . URI    HPI Steven [name] is a 38 y.o. male.   Patient is a 38 year old male with past medical history of asthma, cancer.  Diabetes, HIV positive, hypertension, Kaposi's sarcoma, syphilis.  He presents with cough, wheezing, shortness of breath for the past 3 days.  He feels as if his symptoms are worsening.  He has been using his inhalers as prescribed without much relief of symptoms.  He is having some mild chest tightness and shortness of breath.  He is also having some nasal congestion and rhinorrhea.  He denies any associated fever, chills, body aches, night sweats.  He denies any rashes.  He has had some sick contacts at work.  He does work in a call center with close contact.  ROS per HPI      Past Medical History:  Diagnosis Date  . Asthma   . Cancer ([place])   . Diabetes mellitus without complication ([place])   . HIV positive ([place]) 03/23/09   Genotype [id number]  . HTN (hypertension)   . Kaposi's sarcoma   . Syphilis 03/23/09-   1:2    Patient Active Problem List   Diagnosis Date Noted  . Asthma, severe persistent, well-controlled 06/15/2018  . Allergic reaction 06/15/2018  . Food allergy 06/15/2018  . Seasonal and perennial allergic rhinoconjunctivitis 06/15/2018  . AIDS ([place]) 02/09/2018  . GERD (gastroesophageal reflux disease) 03/28/2017  . Asthma 03/28/2017  . Type 2 diabetes mellitus ([place]) 03/25/2015  . CKD (chronic kidney disease) stage 3, GFR 30-59 ml/min ([facility]) 03/23/2015  . Thrush, oral 03/23/2015  . Essential hypertension 11/24/2014  . Post-nasal drip 02/05/2014  . 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 Arthralgia of elbow, right 07/29/2013  . Colitis 05/27/2011  . KS (Kaposi's sarcoma) ([place]) 04/11/2011  . 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 Human  immunodeficiency virus (HIV) disease ([place]) 04/10/2009  . Lymphedema 04/10/2009  . SYPHILIS 04/06/2009    Past Surgical History:  Procedure Laterality Date  . BRAIN SURGERY    . IR GENERIC HISTORICAL  02/12/2016   IR REMOVAL TUN ACCESS W/ PORT W/O FL MOD SED 02/12/2016 [name], MD [facility]-INTERV RAD       Home Medications    Prior to Admission medications   Medication Sig Start Date End Date Taking? Authorizing Provider  albuterol (PROVENTIL HFA;VENTOLIN HFA) 108 (90 Base) MCG/ACT inhaler INHALE 2 PUFFS BY MOUTH EVERY 4 TO 6 HOURS AS NEEDED FOR COUGH OR WHEEZING 06/19/18   Kozlow, Donnamarie Poag, MD  amLODipine (NORVASC) 10 MG tablet TAKE 1 TABLET(10 MG) BY MOUTH DAILY FOR BLOOD PRESSURE 05/23/18   Pleas [name], NP  esomeprazole (NEXIUM) 40 MG capsule TAKE ONE CAPSULE BY MOUTH ONCE DAILY AS DIRECTED Patient taking differently: Take 40 mg by mouth daily.  02/06/17   Kozlow, Donnamarie Poag, MD  fexofenadine (ALLEGRA) 180 MG tablet Take 1 tablet (180 mg total) by mouth daily. 05/03/12   Harden Mo, MD  fluticasone (FLONASE) 50 MCG/ACT nasal spray Place 2 sprays into both nostrils daily. 02/08/18   Kennith Gain, MD  fluticasone-salmeterol (ADVAIR HFA) [phone] MCG/ACT inhaler Inhale two puffs twice daily to prevent cough or wheeze Patient taking differently: Inhale 2 puffs into the lungs 2 (two) times daily.  02/08/18   Kennith Gain, MD  guaiFENesin [facility])  600 MG 12 hr tablet Take 1 tablet (600 mg total) by mouth 2 (two) times daily. 07/16/18   Loura Halt A, NP  hydrochlorothiazide (HYDRODIURIL) 25 MG tablet Take 1 tablet by mouth once daily for blood pressure. 03/23/18   Pleas [name], NP  LANCETS MICRO THIN 33G MISC USE TO TEST BLOOD SUGAR ONCE A DAY 08/21/17   Tresa Garter, MD  levothyroxine (SYNTHROID, LEVOTHROID) 150 MCG tablet TAKE 1 TABLET BY MOUTH DAILY Patient taking differently: Take 150 mcg by mouth daily before breakfast. Take 1 tablet by mouth daily. 01/02/18    Elayne Snare, MD  metFORMIN (GLUCOPHAGE-XR) 500 MG 24 hr tablet Take 2 tablets in the morning and 1 tablet in the evening. 03/23/18   Pleas [name], NP  montelukast (SINGULAIR) 10 MG tablet Take 1 tablet (10 mg total) by mouth daily. 02/08/18   Kennith Gain, MD  Multiple Vitamin (MULTIVITAMIN) tablet Take 1 tablet by mouth daily.    [provider]  nystatin (MYCOSTATIN) 100000 UNIT/ML suspension 5 ml by mouth 4 times daily. swish and swallow or spit 06/14/18   Garnet Sierras, DO  PAZEO 0.7 % SOLN Place 1 drop into both eyes daily. 02/08/18   Kennith Gain, MD  potassium chloride SA (K-DUR,KLOR-CON) 20 MEQ tablet Take 1 tablet (20 mEq total) by mouth daily. 03/23/18   Pleas [name], NP  predniSONE (DELTASONE) 10 MG tablet Take 4 tablets (40 mg total) by mouth daily for 5 days. 07/16/18 07/21/18  Orvan July, NP  TRIUMEQ 600-50-300 MG tablet TAKE 1 TABLET BY MOUTH EVERY DAY 06/06/18   Campbell Riches, MD  TRUE METRIX BLOOD GLUCOSE TEST test strip USE AS DIRECTED FOR THREE TIMES DAILY TESTING OF BLOOD GLUCOSE 12/18/17   Pleas [name], NP    Family History Family History  Problem Relation Age of Onset  . Pancreatic cancer Mother   . Cancer Mother   . Diabetes Paternal Grandmother   . Thyroid disease Paternal Grandmother     Social History Social History   Tobacco Use  . Smoking status: Never Smoker  . Smokeless tobacco: Never Used  Substance Use Topics  . Alcohol use: Yes    Alcohol/week: 0.0 standard drinks    Comment: socially - less now  . Drug use: No     Allergies   Lisinopril; Penicillins; Levaquin [levofloxacin in d5w]; Tessalon [benzonatate]; and Aspirin   Review of Systems Review of Systems   Physical Exam Triage Vital Signs ED Triage Vitals  Enc Vitals Group     BP 07/16/18 1150 (!) 153/92     Pulse Rate 07/16/18 1150 92     Resp 07/16/18 1150 18     Temp 07/16/18 1150 (!) 97.5 F (36.4 C)     Temp Source 07/16/18  1150 Temporal     SpO2 07/16/18 1150 96 %     Weight --      Height --      Head Circumference --      Peak Flow --      Pain Score 07/16/18 1151 5     Pain Loc --      Pain Edu? --      Excl. in [place]? --    No data found.  Updated Vital Signs BP (!) 153/92 (BP Location: Right Arm)   Pulse 92   Temp (!) 97.5 F (36.4 C) (Temporal)   Resp 18   SpO2 96%   Visual Acuity Right  Eye Distance:   Left Eye Distance:   Bilateral Distance:    Right Eye Near:   Left Eye Near:    Bilateral Near:     Physical Exam Vitals signs and nursing note reviewed.  Constitutional:      General: He is not in acute distress.    Appearance: Normal appearance. He is not ill-appearing or toxic-appearing.  HENT:     Head: Normocephalic and atraumatic.     Right Ear: Tympanic membrane, ear canal and external ear normal.     Left Ear: Tympanic membrane, ear canal and external ear normal.     Nose: Congestion and rhinorrhea present.     Mouth/Throat:     Pharynx: Oropharynx is clear. No posterior oropharyngeal erythema.  Eyes:     Conjunctiva/sclera: Conjunctivae normal.  Neck:     Musculoskeletal: Normal range of motion.  Cardiovascular:     Rate and Rhythm: Normal rate and regular rhythm.     Heart sounds: Normal heart sounds.  Pulmonary:     Comments: Mild dyspnea and expiratory wheezing in all lung fields. Musculoskeletal: Normal range of motion.  Lymphadenopathy:     Cervical: No cervical adenopathy.  Skin:    General: Skin is warm and dry.     Findings: No rash.  Neurological:     Mental Status: He is alert.  Psychiatric:        Mood and Affect: Mood normal.      UC Treatments / Results  Labs (all labs ordered are listed, but only abnormal results are displayed) Labs Reviewed - No data to display  EKG None  Radiology No results found.  Procedures Procedures (including critical care time)  Medications Ordered in UC Medications  ipratropium-albuterol (DUONEB) 0.5-2.5  (3) MG/3ML nebulizer solution 3 mL (3 mLs Nebulization Given 07/16/18 1228)    Initial Impression / Assessment and Plan / UC Course  I have reviewed the triage vital signs and the nursing notes.  Pertinent labs & imaging results that were available during my care of the patient were reviewed by me and considered in my medical decision making (see chart for details).     Patient is a 38 year old male past medical history of asthma here today with cough, congestion, wheezing.  Lung sounds revealed mild expiratory wheezing throughout lung fields.  We will do a DuoNeb in clinic. We will send him home with prescription for prednisone daily for the next 5 days. Mucinex for cough and congestion Follow up as needed for continued or worsening symptoms  Final Clinical Impressions(s) / UC Diagnoses   Final diagnoses:  Acute upper respiratory infection  Wheezing     Discharge Instructions     I believe that you have a viral upper respiratory infection that has exacerbated your asthma Prednisone daily for the next 5 days You can continue to use your inhalers as prescribed at home Mucinex for cough and congestion Follow up as needed for continued or worsening symptoms     ED Prescriptions    Medication Sig Dispense Auth. Provider   predniSONE (DELTASONE) 10 MG tablet Take 4 tablets (40 mg total) by mouth daily for 5 days. 20 tablet Leighanna Kirn A, NP   guaiFENesin (MUCINEX) 600 MG 12 hr tablet Take 1 tablet (600 mg total) by mouth 2 (two) times daily. 15 tablet Loura Halt A, NP     Controlled Substance Prescriptions [place] Controlled Substance Registry consulted? Not Applicable   Orvan July, NP 07/16/18 2129

## 2018-07-16 NOTE — Discharge Instructions (Addendum)
I believe that you have a viral upper respiratory infection that has exacerbated your asthma Prednisone daily for the next 5 days You can continue to use your inhalers as prescribed at home Mucinex for cough and congestion Follow up as needed for continued or worsening symptoms

## 2018-07-19 ENCOUNTER — Ambulatory Visit: Payer: PPO | Attending: Oncology | Admitting: Physical Therapy

## 2018-07-19 ENCOUNTER — Encounter: Payer: Self-pay | Admitting: Physical Therapy

## 2018-07-19 ENCOUNTER — Encounter: Payer: Self-pay | Admitting: Podiatry

## 2018-07-19 ENCOUNTER — Ambulatory Visit (INDEPENDENT_AMBULATORY_CARE_PROVIDER_SITE_OTHER): Payer: PPO | Admitting: Podiatry

## 2018-07-19 ENCOUNTER — Ambulatory Visit (INDEPENDENT_AMBULATORY_CARE_PROVIDER_SITE_OTHER): Payer: PPO | Admitting: *Deleted

## 2018-07-19 ENCOUNTER — Other Ambulatory Visit: Payer: Self-pay

## 2018-07-19 DIAGNOSIS — M79676 Pain in unspecified toe(s): Secondary | ICD-10-CM

## 2018-07-19 DIAGNOSIS — J4550 Severe persistent asthma, uncomplicated: Secondary | ICD-10-CM | POA: Diagnosis not present

## 2018-07-19 DIAGNOSIS — E119 Type 2 diabetes mellitus without complications: Secondary | ICD-10-CM | POA: Diagnosis not present

## 2018-07-19 DIAGNOSIS — I890 Lymphedema, not elsewhere classified: Secondary | ICD-10-CM | POA: Diagnosis not present

## 2018-07-19 DIAGNOSIS — B351 Tinea unguium: Secondary | ICD-10-CM

## 2018-07-19 NOTE — Progress Notes (Signed)
Complaint:  Visit Type: Patient returns to my office for continued preventative foot care services. Complaint: Patient states" my nails have grown long and thick and become painful to walk and wear shoes"  The patient presents for preventative foot care services. No changes to ROS.   Podiatric Exam: Vascular: dorsalis pedis and posterior tibial pulses are palpable bilateral. Capillary return is immediate. Temperature gradient is WNL. Skin turgor WNL  Sensorium: Normal Semmes Weinstein monofilament test. Normal tactile sensation bilaterally. Nail Exam: Pt has thick disfigured discolored nails with subungual debris noted bilateral entire nail hallux through fifth toenails Ulcer Exam: There is no evidence of ulcer or pre-ulcerative changes or infection. Orthopedic Exam: Muscle tone and strength are WNL. No limitations in general ROM. No crepitus or effusions noted. Foot type and digits show no abnormalities. Bony prominences are unremarkable. Skin: No Porokeratosis. No infection or ulcers  Diagnosis:  Onychomycosis, , Pain in right toe, pain in left toes  Treatment & Plan Procedures and Treatment: Consent by patient was obtained for treatment procedures. The patient understood the discussion of treatment and procedures well. All questions were answered thoroughly reviewed.  Told him he can try topical medicine for his nails. Return Visit-Office Procedure: Patient instructed to return to the office for a follow up visit 3 months for continued evaluation and treatment.    Gardiner Barefoot DPM

## 2018-07-19 NOTE — Therapy (Signed)
[place], [place], [address] Phone: [phone]   Fax:  [phone]  Physical Therapy Evaluation  Patient Details  Name: Steven [name] MRN: [id number] Date of Birth: 1980/07/12 Referring Provider (PT): Dr. Benay Spice    Encounter Date: 07/19/2018  PT End of Session - 07/19/18 1241    Visit Number  1    Number of Visits  19    Date for PT Re-Evaluation  08/30/18    PT Start Time  1100    PT Stop Time  1145    PT Time Calculation (min)  45 min    Activity Tolerance  Patient tolerated treatment well    Behavior During Therapy  [facility] for tasks assessed/performed       Past Medical History:  Diagnosis Date  . Asthma   . Cancer ([place])   . Diabetes mellitus without complication ([place])   . HIV positive ([place]) 03/23/09   Genotype Y181C  . HTN (hypertension)   . Kaposi's sarcoma   . Syphilis 03/23/09-   1:2    Past Surgical History:  Procedure Laterality Date  . BRAIN SURGERY    . IR GENERIC HISTORICAL  02/12/2016   IR REMOVAL TUN ACCESS W/ PORT W/O FL MOD SED 02/12/2016 [name], MD [facility]    There were no vitals filed for this visit.   Subjective Assessment - 07/19/18 1108    Subjective  Pt says that he has lost >70 pounds over the past year with diet changes and exercise.  He has got treatment for lymphedema in the past and got reduction with compression bandaging and has worn knee high compression stockings, but they no longer fit. 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  No/denies    Multiple Pain Sites  Yes         [facility] PT Assessment - 07/19/18 0001      Assessment   Medical Diagnosis  Kaposki's sarcoman    Referring Provider (PT)  Dr.  Benay Spice     Onset Date/Surgical Date  07/11/13   approximate    Hand Dominance  Right      Precautions   Precaution Comments  history of HIV      Restrictions   Weight Bearing Restrictions  Yes      Balance Screen   Has the patient fallen in the past 6 months  No    Has the patient had a decrease in activity level because of a fear of falling?   No    Is the patient reluctant to leave their home because of a fear of falling?   No      Home Environment   Living Environment  Private residence    Living Arrangements  Alone    Available Help at Discharge  Available PRN/intermittently      Prior Function   Level of Independence  Independent    Vocation  Full time employment    Vocation Requirements  manages a call center     Leisure  exercises 3 times a week water aerobic , cyling       Cognition   Overall Cognitive Status  Within Functional Limits for  tasks assessed      Observation/Other Assessments   Observations  Pt has visible lymphedema  dry keratotic patches on right leg     Skin Integrity  no open areas     Other Surveys   Other Surveys   lymphedema life impact scale 22.06     Sensation   Light Touch  Not tested        LYMPHEDEMA/ONCOLOGY QUESTIONNAIRE - 07/19/18 1115      Type   Cancer Type  kaposki's sarcoma      Treatment   Past Chemotherapy Treatment  Yes    Date  07/11/13   approximate   Past Radiation Treatment  No      What other symptoms do you have   Are you Having Heaviness or Tightness  Yes   related to overdoing it    Are you having Pain  No    Are you having pitting edema  Yes   sometimes    Is it Hard or Difficult finding clothes that fit  Yes    Do you have infections  Yes    Comments  last year       Lymphedema Stage   Stage  --   better in the morning, worse when up on his feet      Right Lower Extremity Lymphedema   At Midpatella/Popliteal Crease  43.5 cm    30 cm Proximal to Floor at Lateral Plantar Foot  39.5 cm    20 cm  Proximal to Floor at Lateral Plantar Foot  31.8 1    10  cm Proximal to Floor at Lateral Malleoli  33 cm    5 cm Proximal to 1st MTP Joint  28 cm    Across MTP Joint  27.5 cm    Around Proximal Great Toe  9 cm      Left Lower Extremity Lymphedema   At Midpatella/Popliteal Crease  44.5 cm    30 cm Proximal to Floor at Lateral Plantar Foot  37.5 cm    20 cm Proximal to Floor at Lateral Plantar Foot  30.5 cm    10 cm Proximal to Floor at Lateral Malleoli  31.5 cm    5 cm Proximal to 1st MTP Joint  27 cm    Across MTP Joint  27.5 cm    Around Proximal Great Toe  9 cm             [facility]patient [facility]  Lymphedema Life Impact Scale Total Score  20.59 %      Objective measurements completed on examination: See above findings.         [place] Adult PT Treatment/Exercise - 07/19/18 0001      Self-Care   Self-Care  Other Self-Care Comments    Other Self-Care Comments   reviewed diaphragmatic breathing , elevation and exercise, talked about flat knit vs circular knit garments and gave pt infomration about [facility] Therapy in [place], talked about a compression pump and pt wants to go with [place] - 07/19/18 1249      PT LONG TERM GOAL #1   Title  Pt will have reduction of right lower leg at 10 cm proximal to floor by 2 cm    Baseline  33 cm on 07/19/2018    Time  6    Period  Weeks    Status  New      PT LONG TERM GOAL #2   Title  pt will have reduction of left lower leg at 10 cm proximal to floor by 2 cm     Baseline  31.5 cm on 07/19/2018    Time  6    Period  Weeks      PT LONG TERM GOAL #3   Title  pt will reprort he is able to manage his lymphedema at home with elevation, exercise, compression pump and garments     Time  6    Period  Weeks    Status  New             Plan - 07/19/18 1241    Clinical Impression Statement  38 yo male known to this PT  from treatment for lymphedema several years ago returns for more treatment of lymphedema as he needs more treatment prior to getting new compression garments.  He is interested in getting a compresison pump for more long term management as well and has agreed to send his information to [employer] . Information will also be sent to [facility] for flat knit garment measurement.      History and Personal Factors relevant to plan of care:  history of HIV, virus now nondetectable, lives alone, full time job that requires him to be on his feet     Clinical Presentation  Stable    Clinical Presentation due to:  no further treatment     Clinical Decision Making  Low    Rehab Potential  Good    PT Frequency  3x / week   decrease frequency as pt improves    PT Duration  6 weeks    PT Treatment/Interventions  ADLs/Self Care Home Management;DME Instruction;Therapeutic exercise;Patient/family education;Orthotic Fit/Training;Manual techniques;Compression bandaging;Manual lymph drainage;Taping    PT Next Visit Plan  begin MLD and compression bandaging to right leg with extra foam applies to area of lower leg     Consulted and Agree with Plan of Care  Patient       Patient will benefit from skilled therapeutic intervention in order to improve the following deficits and impairments:  Increased edema, Decreased knowledge of use of DME  Visit Diagnosis: Lymphedema, not elsewhere classified - Plan: PT plan of care cert/re-cert     Problem List Patient Active Problem List   Diagnosis Date Noted  . Asthma, severe persistent, well-controlled 06/15/2018  . Allergic reaction 06/15/2018  . Food allergy 06/15/2018  . Seasonal and perennial allergic rhinoconjunctivitis 06/15/2018  . AIDS ([place]) 02/09/2018  . GERD (gastroesophageal reflux disease) 03/28/2017  . Asthma 03/28/2017  . Type 2 diabetes mellitus ([place]) 03/25/2015  . CKD (chronic kidney disease) stage 3, GFR 30-59 ml/min (HCC) 03/23/2015  .  Thrush, oral 03/23/2015  . Essential hypertension 11/24/2014  . Post-nasal drip 02/05/2014  . 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 Arthralgia of elbow, right 07/29/2013  . Colitis 05/27/2011  . KS (Kaposi's sarcoma) ([place]) 04/11/2011  . 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 Human immunodeficiency virus (HIV) disease ([place]) 04/10/2009  . Lymphedema 04/10/2009  . SYPHILIS 04/06/2009   Donato Heinz. Owens Shark PT  Norwood Levo 07/19/2018, 12:55 PM  [place], [place], [address] Phone: [phone]   Fax:  [phone]  Name: Ajeet Casasola MRN: [id number] Date of Birth: 1980-11-16

## 2018-07-20 ENCOUNTER — Ambulatory Visit: Payer: PPO | Admitting: Physical Therapy

## 2018-07-23 ENCOUNTER — Ambulatory Visit: Payer: PPO | Admitting: Rehabilitation

## 2018-07-23 ENCOUNTER — Encounter: Payer: Self-pay | Admitting: Rehabilitation

## 2018-07-23 DIAGNOSIS — I890 Lymphedema, not elsewhere classified: Secondary | ICD-10-CM | POA: Diagnosis not present

## 2018-07-23 NOTE — Therapy (Signed)
[place], [place], [address] Phone: [phone]   Fax:  [phone]  Physical Therapy Treatment  Patient Details  Name: Steven [name] MRN: [id number] Date of Birth: July 17, 1980 Referring Provider (PT): Dr. Benay Spice    Encounter Date: 07/23/2018  PT End of Session - 07/23/18 0841    Visit Number  2    Number of Visits  19    Date for PT Re-Evaluation  08/30/18    PT Start Time  0800    PT Stop Time  0838    PT Time Calculation (min)  38 min    Activity Tolerance  Patient tolerated treatment well    Behavior During Therapy  [facility] for tasks assessed/performed       Past Medical History:  Diagnosis Date  . Asthma   . Cancer ([place])   . Diabetes mellitus without complication ([place])   . HIV positive ([place]) 03/23/09   Genotype Y181C  . HTN (hypertension)   . Kaposi's sarcoma   . Syphilis 03/23/09-   1:2    Past Surgical History:  Procedure Laterality Date  . BRAIN SURGERY    . IR GENERIC HISTORICAL  02/12/2016   IR REMOVAL TUN ACCESS W/ PORT W/O FL MOD SED 02/12/2016 [name], MD [facility]    There were no vitals filed for this visit.  Subjective Assessment - 07/23/18 0807    Subjective  ready to start.      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  No/denies                  Outpatient Re[facility]  Lymphedema Life Impact Scale Total Score  20.59 %           [facility] Adult PT Treatment/Exercise - 07/23/18 0001      Manual Therapy   Manual Therapy  Compression Bandaging;Edema management;Manual Lymphatic Drainage (MLD)    Edema Management  gave pt handout on wrapping own leg if wanted to attempt or  instructed to put on a garment until Friday.  Also bandage care at home    Manual Lymphatic Drainage (MLD)  in supine abdominals and breathing, Rt inguinal nodes, from lateral thigh, medial thigh, knee, and lower leg working proximally to distally and then reversing steps to the groin    Compression Bandaging  applied thick stockinette, 2 kidney 1/2" foam around malleoli, then artiflex, 1-6cm, 1 8-cm, and 1 10-cm from foot to knee                  PT Long Term Goals - 07/19/18 1249      PT LONG TERM GOAL #1   Title  Pt will have reduction of right lower leg at 10 cm proximal to floor by 2 cm    Baseline  33 cm on 07/19/2018    Time  6    Period  Weeks    Status  New      PT LONG TERM GOAL #2   Title  pt will have reduction of left lower leg at 10 cm proximal to floor by 2 cm     Baseline  31.5 cm on 07/19/2018  Time  6    Period  Weeks      PT LONG TERM GOAL #3   Title  pt will reprort he is able to manage his lymphedema at home with elevation, exercise, compression pump and garments     Time  6    Period  Weeks    Status  New            Plan - 07/23/18 0321    Clinical Impression Statement  first day of CDT today with MLD and compression bandaging.  Most swelling evident at the ankle so added foam kidneys at malleoli.  Seemed to get enough compression with 3 bandages but as first day may add more next time     PT Frequency  3x / week    PT Duration  6 weeks    PT Treatment/Interventions  ADLs/Self Care Home Management;DME Instruction;Therapeutic exercise;Patient/family education;Orthotic Fit/Training;Manual techniques;Compression bandaging;Manual lymph drainage;Taping    PT Next Visit Plan  continue MLD and compression bandaging    Recommended Other Services  circular knit from [place], flat knit from [place], pump from [facility] [employer]       Patient will benefit from skilled therapeutic intervention in order to improve the following deficits and impairments:      Visit Diagnosis: Lymphedema, not elsewhere classified     Problem List Patient Active Problem List   Diagnosis Date Noted  . Asthma, severe persistent, well-controlled 06/15/2018  . Allergic reaction 06/15/2018  . Food allergy 06/15/2018  . Seasonal and perennial allergic rhinoconjunctivitis 06/15/2018  . AIDS ([place]) 02/09/2018  . GERD (gastroesophageal reflux disease) 03/28/2017  . Asthma 03/28/2017  . Type 2 diabetes mellitus ([place]) 03/25/2015  . CKD (chronic kidney disease) stage 3, GFR 30-59 ml/min (HCC) 03/23/2015  . Thrush, oral 03/23/2015  . Essential hypertension 11/24/2014  . Post-nasal drip 02/05/2014  . 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 Arthralgia of elbow, right 07/29/2013  . Colitis 05/27/2011  . KS (Kaposi's sarcoma) ([place]) 04/11/2011  . 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 Human immunodeficiency virus (HIV) disease ([place]) 04/10/2009  . Lymphedema 04/10/2009  . SYPHILIS 04/06/2009    Shan Levans, PT 07/23/2018, 8:44 AM  [place], [place], [address] Phone: [phone]   Fax:  [phone]  Name: Deshun Sedivy MRN: [id number] Date of Birth: [birth year]

## 2018-07-27 ENCOUNTER — Encounter: Payer: Self-pay | Admitting: Rehabilitation

## 2018-07-27 ENCOUNTER — Ambulatory Visit: Payer: PPO | Admitting: Rehabilitation

## 2018-07-27 DIAGNOSIS — I890 Lymphedema, not elsewhere classified: Secondary | ICD-10-CM

## 2018-07-27 NOTE — Therapy (Signed)
[place], [place], [address] Phone: [phone]   Fax:  [phone]  Physical Therapy Treatment  Patient Details  Name: Steven [name] MRN: [id number] Date of Birth: 1980-08-29 Referring Provider (PT): Dr. Benay Spice    Encounter Date: 07/27/2018  PT End of Session - 07/27/18 0841    Visit Number  3    Number of Visits  19    Date for PT Re-Evaluation  08/30/18    PT Start Time  0810    PT Stop Time  0841    PT Time Calculation (min)  31 min    Activity Tolerance  Patient tolerated treatment well    Behavior During Therapy  [facility] for tasks assessed/performed       Past Medical History:  Diagnosis Date  . Asthma   . Cancer ([place])   . Diabetes mellitus without complication ([place])   . HIV positive ([place]) 03/23/09   Genotype Y181C  . HTN (hypertension)   . Kaposi's sarcoma   . Syphilis 03/23/09-   1:2    Past Surgical History:  Procedure Laterality Date  . BRAIN SURGERY    . IR GENERIC HISTORICAL  02/12/2016   IR REMOVAL TUN ACCESS W/ PORT W/O FL MOD SED 02/12/2016 [name], MD [facility]    There were no vitals filed for this visit.  Subjective Assessment - 07/27/18 0811    Subjective  The bandages stayed on until yesterday then it got itchy.  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  No/denies                  Outpatient Re[facility]  Lymphedema Life Impact Scale Total Score  20.59 %           [facility] Adult PT Treatment/Exercise - 07/27/18 0001      Manual Therapy   Edema Management  dicussed new juzo power comfort sock    Manual Lymphatic Drainage (MLD)  in  supine abdominals and breathing, Rt inguinal nodes, from lateral thigh, medial thigh, knee, and lower leg working proximally to distally and then reversing steps to the groin    Compression Bandaging  applied thick stockinette, 2 kidney 1/2" foam around malleoli, then artiflex, 1-6cm, 1 8-cm, and 1 10-cm from foot to knee                  PT Long Term Goals - 07/19/18 1249      PT LONG TERM GOAL #1   Title  Pt will have reduction of right lower leg at 10 cm proximal to floor by 2 cm    Baseline  33 cm on 07/19/2018    Time  6    Period  Weeks    Status  New      PT LONG TERM GOAL #2   Title  pt will have reduction of left lower leg at 10 cm proximal to floor by 2 cm     Baseline  31.5 cm on 07/19/2018    Time  6    Period  Weeks      PT LONG TERM GOAL #3  Title  pt will reprort he is able to manage his lymphedema at home with elevation, exercise, compression pump and garments     Time  6    Period  Weeks    Status  New            Plan - 07/27/18 8864    Clinical Impression Statement  Pt reporting the bandages went well and he felt the leg looked smaller upon removing yesterday.  Continues with increased edema at the malleoli.      PT Frequency  3x / week    PT Duration  6 weeks    PT Treatment/Interventions  ADLs/Self Care Home Management;DME Instruction;Therapeutic exercise;Patient/family education;Orthotic Fit/Training;Manual techniques;Compression bandaging;Manual lymph drainage;Taping    PT Next Visit Plan  continue MLD and compression bandaging, one more bandage?, skin care ideas, set up with Melissa for measurement        Patient will benefit from skilled therapeutic intervention in order to improve the following deficits and impairments:  Increased edema, Decreased knowledge of use of DME  Visit Diagnosis: Lymphedema, not elsewhere classified     Problem List Patient Active Problem List   Diagnosis Date Noted  . Asthma, severe persistent,  well-controlled 06/15/2018  . Allergic reaction 06/15/2018  . Food allergy 06/15/2018  . Seasonal and perennial allergic rhinoconjunctivitis 06/15/2018  . AIDS ([place]) 02/09/2018  . GERD (gastroesophageal reflux disease) 03/28/2017  . Asthma 03/28/2017  . Type 2 diabetes mellitus ([place]) 03/25/2015  . CKD (chronic kidney disease) stage 3, GFR 30-59 ml/min (HCC) 03/23/2015  . Thrush, oral 03/23/2015  . Essential hypertension 11/24/2014  . Post-nasal drip 02/05/2014  . 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 Arthralgia of elbow, right 07/29/2013  . Colitis 05/27/2011  . KS (Kaposi's sarcoma) ([place]) 04/11/2011  . 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 Human immunodeficiency virus (HIV) disease ([place]) 04/10/2009  . Lymphedema 04/10/2009  . SYPHILIS 04/06/2009    Shan Levans, PT 07/27/2018, 8:43 AM  [place], [place], [address] Phone: [phone]   Fax:  [phone]  Name: Steven [name] MRN: [id number] Date of Birth: 1980/08/27

## 2018-07-30 ENCOUNTER — Ambulatory Visit: Payer: PPO

## 2018-08-01 ENCOUNTER — Ambulatory Visit: Payer: PPO

## 2018-08-01 DIAGNOSIS — I890 Lymphedema, not elsewhere classified: Secondary | ICD-10-CM

## 2018-08-01 NOTE — Therapy (Signed)
[place], [place], [address] Phone: [phone]   Fax:  [phone]  Physical Therapy Treatment  Patient Details  Name: Steven [name] MRN: [id number] Date of Birth: 1980-07-12 Referring Provider (PT): Dr. Benay Spice    Encounter Date: 08/01/2018  PT End of Session - 08/01/18 0931    Visit Number  4    Number of Visits  19    Date for PT Re-Evaluation  08/30/18    PT Start Time  0824   Therapist arrived late due to traffic   PT Stop Time  0905    PT Time Calculation (min)  41 min    Activity Tolerance  Patient tolerated treatment well    Behavior During Therapy  [facility] for tasks assessed/performed       Past Medical History:  Diagnosis Date  . Asthma   . Cancer ([place])   . Diabetes mellitus without complication ([place])   . HIV positive ([place]) 03/23/09   Genotype Y181C  . HTN (hypertension)   . Kaposi's sarcoma   . Syphilis 03/23/09-   1:2    Past Surgical History:  Procedure Laterality Date  . BRAIN SURGERY    . IR GENERIC HISTORICAL  02/12/2016   IR REMOVAL TUN ACCESS W/ PORT W/O FL MOD SED 02/12/2016 [name], MD [facility]    There were no vitals filed for this visit.  Subjective Assessment - 08/01/18 0827    Subjective  I didn't realize I had an appt Monday, it wasn't on my calendar.    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  No/denies                  Outpatient Re[facility]  Lymphedema Life Impact Scale Total Score  20.59 %           [facility] Adult PT Treatment/Exercise - 08/01/18 0001      Manual Therapy   Manual Lymphatic Drainage (MLD)  In supine: short neck, superficial  and deep abdominals, Rt axillary nodes and Rt inguino-axillary anastomosis, then lateral thigh, medial thigh, knee, and lower leg working proximally to distally and then retracing all steps towards pathway.    Compression Bandaging  applied massage cream and then thick stockinette, (pt did not have kidney foams today), then artiflex, 1-6cm, 1-8 cm, 1-10 cm, and 1-12 cm from foot to knee                  PT Long Term Goals - 07/19/18 1249      PT LONG TERM GOAL #1   Title  Pt will have reduction of right lower leg at 10 cm proximal to floor by 2 cm    Baseline  33 cm on 07/19/2018    Time  6    Period  Weeks    Status  New      PT LONG TERM GOAL #2   Title  pt will have reduction of left lower leg at 10 cm proximal to floor by 2 cm     Baseline  31.5 cm on 07/19/2018    Time  6    Period  Weeks  PT LONG TERM GOAL #3   Title  pt will reprort he is able to manage his lymphedema at home with elevation, exercise, compression pump and garments     Time  6    Period  Weeks    Status  New            Plan - 08/01/18 3419    Clinical Impression Statement  Pt did not know he had an appt Monday as this was not on his schedule so his bandages have been off for a few days. Did not remeasure circumference today due to this. Continued with complete decongestive therapy to Rt LE. Added another bandage (12 cm) to promote further reduction. Pt is able to come Monday afternoon to be measured by [facility] fitter Melissa for bil knee high compression stockings.     Rehab Potential  Good    PT Frequency  3x / week    PT Duration  6 weeks    PT Treatment/Interventions  ADLs/Self Care Home Management;DME Instruction;Therapeutic exercise;Patient/family education;Orthotic Fit/Training;Manual techniques;Compression bandaging;Manual lymph drainage;Taping    PT Next Visit Plan  continue complete decongestive therapy to Rt LE and assess how was extra bandage?, skin care ideas (see if he tried  coconut oil or made an appt with dermatologist as he mentioned wanting to do)    Consulted and Agree with Plan of Care  Patient       Patient will benefit from skilled therapeutic intervention in order to improve the following deficits and impairments:  Increased edema, Decreased knowledge of use of DME  Visit Diagnosis: Lymphedema, not elsewhere classified     Problem List Patient Active Problem List   Diagnosis Date Noted  . Asthma, severe persistent, well-controlled 06/15/2018  . Allergic reaction 06/15/2018  . Food allergy 06/15/2018  . Seasonal and perennial allergic rhinoconjunctivitis 06/15/2018  . AIDS ([place]) 02/09/2018  . GERD (gastroesophageal reflux disease) 03/28/2017  . Asthma 03/28/2017  . Type 2 diabetes mellitus ([place]) 03/25/2015  . CKD (chronic kidney disease) stage 3, GFR 30-59 ml/min (HCC) 03/23/2015  . Thrush, oral 03/23/2015  . Essential hypertension 11/24/2014  . Post-nasal drip 02/05/2014  . 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 Arthralgia of elbow, right 07/29/2013  . Colitis 05/27/2011  . KS (Kaposi's sarcoma) ([place]) 04/11/2011  . 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 Human immunodeficiency virus (HIV) disease ([place]) 04/10/2009  . Lymphedema 04/10/2009  . SYPHILIS 04/06/2009    Otelia Limes, PTA 08/01/2018, 9:40 AM  [place], [place], [address] Phone: [phone]   Fax:  [phone]  Name: Steven [name] MRN: [id number] Date of Birth: [birth year]

## 2018-08-02 ENCOUNTER — Ambulatory Visit (INDEPENDENT_AMBULATORY_CARE_PROVIDER_SITE_OTHER): Payer: PPO | Admitting: *Deleted

## 2018-08-02 DIAGNOSIS — J4550 Severe persistent asthma, uncomplicated: Secondary | ICD-10-CM

## 2018-08-03 ENCOUNTER — Encounter: Payer: Self-pay | Admitting: Rehabilitation

## 2018-08-03 ENCOUNTER — Ambulatory Visit: Payer: PPO | Admitting: Rehabilitation

## 2018-08-03 DIAGNOSIS — I890 Lymphedema, not elsewhere classified: Secondary | ICD-10-CM | POA: Diagnosis not present

## 2018-08-03 NOTE — Therapy (Signed)
[place], [place], [address] Phone: [phone]   Fax:  [phone]  Physical Therapy Treatment  Patient Details  Name: Steven [name] MRN: [id number] Date of Birth: 11/07/1980 Referring Provider (PT): Dr. Benay Spice    Encounter Date: 08/03/2018  PT End of Session - 08/03/18 0841    Visit Number  5    Number of Visits  19    Date for PT Re-Evaluation  08/30/18    PT Start Time  0800    PT Stop Time  0841    PT Time Calculation (min)  41 min    Activity Tolerance  Patient tolerated treatment well    Behavior During Therapy  [facility] for tasks assessed/performed       Past Medical History:  Diagnosis Date  . Asthma   . Cancer ([place])   . Diabetes mellitus without complication ([place])   . HIV positive ([place]) 03/23/09   Genotype Y181C  . HTN (hypertension)   . Kaposi's sarcoma   . Syphilis 03/23/09-   1:2    Past Surgical History:  Procedure Laterality Date  . BRAIN SURGERY    . IR GENERIC HISTORICAL  02/12/2016   IR REMOVAL TUN ACCESS W/ PORT W/O FL MOD SED 02/12/2016 [name], MD [facility]    There were no vitals filed for this visit.  Subjective Assessment - 08/03/18 0806    Subjective  took them off late last night.  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  No/denies            LYMPHEDEMA/ONCOLOGY QUESTIONNAIRE - 08/03/18 0807      Right Lower Extremity Lymphedema   At Midpatella/Popliteal Crease  43.5 cm    30 cm Proximal to Floor at Lateral Plantar Foot  40.2 cm    20 cm Proximal to Floor at Lateral Plantar Foot  33.4 1    10  cm Proximal to Floor at Lateral Malleoli  33 cm    5 cm Proximal to 1st MTP Joint  28 cm     Across MTP Joint  28 cm    Around Proximal Great Toe  9 cm           Outpatient [facility]  Lymphedema Life Impact Scale Total Score  20.59 %           [facility] Adult PT Treatment/Exercise - 08/03/18 0001      Manual Therapy   Manual Lymphatic Drainage (MLD)  In supine: short neck, superficial and deep abdominals, Rt axillary nodes and Rt inguino-axillary anastomosis, then lateral thigh, medial thigh, knee, and lower leg working proximally to distally and then retracing all steps towards pathway.    Compression Bandaging  applied massage cream and then thick stockinette, (pt did not have kidney foams today), then artiflex, 1-6cm, 1-8 cm, 1-10 cm, and 1-12 cm from foot to knee                  PT Long Term Goals - 08/03/18 0843      PT LONG TERM GOAL #1   Title  Pt will have reduction of right  lower leg at 10 cm proximal to floor by 2 cm    Status  On-going      PT LONG TERM GOAL #2   Title  pt will have reduction of left lower leg at 10 cm proximal to floor by 2 cm     Status  On-going      PT LONG TERM GOAL #3   Title  pt will reprort he is able to manage his lymphedema at home with elevation, exercise, compression pump and garments     Status  Partially Met            Plan - 08/03/18 0842    Clinical Impression Statement  No sig changes in measurements from eval but pts skin has improved extensibility and movement also with wrinkles present in the foot and lower leg.  He will be measured for his flat knits Monday and should be ready for DC when the arrive     PT Frequency  3x / week    PT Duration  6 weeks    PT Treatment/Interventions  ADLs/Self Care Home Management;DME Instruction;Therapeutic exercise;Patient/family education;Orthotic Fit/Training;Manual techniques;Compression bandaging;Manual lymph drainage;Taping    PT Next Visit Plan  continue complete decongestive therapy to Rt LE         Patient will benefit from skilled therapeutic intervention in order to improve the following deficits and impairments:     Visit Diagnosis: Lymphedema, not elsewhere classified     Problem List Patient Active Problem List   Diagnosis Date Noted  . Asthma, severe persistent, well-controlled 06/15/2018  . Allergic reaction 06/15/2018  . Food allergy 06/15/2018  . Seasonal and perennial allergic rhinoconjunctivitis 06/15/2018  . AIDS ([place]) 02/09/2018  . GERD (gastroesophageal reflux disease) 03/28/2017  . Asthma 03/28/2017  . Type 2 diabetes mellitus ([place]) 03/25/2015  . CKD (chronic kidney disease) stage 3, GFR 30-59 ml/min (HCC) 03/23/2015  . Thrush, oral 03/23/2015  . Essential hypertension 11/24/2014  . Post-nasal drip 02/05/2014  . 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 Arthralgia of elbow, right 07/29/2013  . Colitis 05/27/2011  . KS (Kaposi's sarcoma) ([place]) 04/11/2011  . 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 Human immunodeficiency virus (HIV) disease ([place]) 04/10/2009  . Lymphedema 04/10/2009  . SYPHILIS 04/06/2009    Shan Levans, PT 08/03/2018, 8:43 AM  [place], [place], [address] Phone: [phone]   Fax:  [phone]  Name: Edric Fetterman MRN: [id number] Date of Birth: 11-10-1980

## 2018-08-05 ENCOUNTER — Other Ambulatory Visit: Payer: Self-pay | Admitting: Allergy

## 2018-08-06 ENCOUNTER — Ambulatory Visit: Payer: PPO

## 2018-08-06 ENCOUNTER — Other Ambulatory Visit: Payer: Self-pay | Admitting: Allergy

## 2018-08-08 ENCOUNTER — Other Ambulatory Visit: Payer: Self-pay | Admitting: Allergy

## 2018-08-08 ENCOUNTER — Ambulatory Visit: Payer: PPO

## 2018-08-08 DIAGNOSIS — I890 Lymphedema, not elsewhere classified: Secondary | ICD-10-CM

## 2018-08-08 NOTE — Therapy (Signed)
[place], [place], [address] Phone: [phone]   Fax:  [phone]  Physical Therapy Treatment  Patient Details  Name: Steven [name] MRN: [id number] Date of Birth: Mar 04, 1981 Referring Provider (PT): Dr. Benay Spice    Encounter Date: 08/08/2018  PT End of Session - 08/08/18 0843    Visit Number  6    Number of Visits  19    Date for PT Re-Evaluation  08/30/18    PT Start Time  0805    PT Stop Time  0843    PT Time Calculation (min)  38 min    Activity Tolerance  Patient tolerated treatment well    Behavior During Therapy  [facility] for tasks assessed/performed       Past Medical History:  Diagnosis Date  . Asthma   . Cancer ([place])   . Diabetes mellitus without complication ([place])   . HIV positive ([place]) 03/23/09   Genotype Y181C  . HTN (hypertension)   . Kaposi's sarcoma   . Syphilis 03/23/09-   1:2    Past Surgical History:  Procedure Laterality Date  . BRAIN SURGERY    . IR GENERIC HISTORICAL  02/12/2016   IR REMOVAL TUN ACCESS W/ PORT W/O FL MOD SED 02/12/2016 [name], MD [facility]    There were no vitals filed for this visit.  Subjective Assessment - 08/08/18 0811    Subjective  I'm sorry I missed my appointments Monday (PT and to be measured for garments) I had food poisoning.     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  pt with lymphedema in right leg (minimal in left) for several years     Patient Stated Goals  to get reduction in legs, get more stockings, and learn how to maintain reductions                   Outpatient Re[facility]19/2018 [facility]  Lymphedema Life Impact Scale Total Score  20.59 %           [facility] Adult PT Treatment/Exercise - 08/08/18 0001      Manual Therapy   Manual Lymphatic Drainage (MLD)  In supine: short neck, superficial and deep abdominals, Rt axillary  nodes and Rt inguino-axillary anastomosis, then lateral thigh, medial thigh, knee, and lower leg working proximally to distally and then retracing all steps towards pathway.    Compression Bandaging  applied massage cream and then thick stockinette, artiflex from foot to knee then 1-6cm, 1-8 cm, 1-10 cm, and 1-12 cm from foot to knee                  PT Long Term Goals - 08/03/18 0843      PT LONG TERM GOAL #1   Title  Pt will have reduction of right lower leg at 10 cm proximal to floor by 2 cm    Status  On-going      PT LONG TERM GOAL #2   Title  pt will have reduction of left lower leg at 10 cm proximal to floor by 2 cm     Status  On-going      PT LONG TERM GOAL #3   Title  pt will reprort he is able to manage his lymphedema at home with elevation, exercise, compression pump and garments     Status  Partially Met  Plan - 08/08/18 0843    Clinical Impression Statement  Pt reports had food poisoning so was unable to come Monday. Plans to come when fitter here next on Feb 10 to be measured for flat knit garments. Continued complete decongestive therapy of Rt LE which pt is tolerating well.    Rehab Potential  Good    PT Frequency  3x / week    PT Duration  6 weeks    PT Treatment/Interventions  ADLs/Self Care Home Management;DME Instruction;Therapeutic exercise;Patient/family education;Orthotic Fit/Training;Manual techniques;Compression bandaging;Manual lymph drainage;Taping    PT Next Visit Plan  continue complete decongestive therapy to Rt LE     Consulted and Agree with Plan of Care  Patient       Patient will benefit from skilled therapeutic intervention in order to improve the following deficits and impairments:  Increased edema, Decreased knowledge of use of DME  Visit Diagnosis: Lymphedema, not elsewhere classified     Problem List Patient Active Problem List   Diagnosis Date Noted  . Asthma, severe persistent, well-controlled 06/15/2018   . Allergic reaction 06/15/2018  . Food allergy 06/15/2018  . Seasonal and perennial allergic rhinoconjunctivitis 06/15/2018  . AIDS ([place]) 02/09/2018  . GERD (gastroesophageal reflux disease) 03/28/2017  . Asthma 03/28/2017  . Type 2 diabetes mellitus ([place]) 03/25/2015  . CKD (chronic kidney disease) stage 3, GFR 30-59 ml/min (HCC) 03/23/2015  . Thrush, oral 03/23/2015  . Essential hypertension 11/24/2014  . Post-nasal drip 02/05/2014  . 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 Arthralgia of elbow, right 07/29/2013  . Colitis 05/27/2011  . KS (Kaposi's sarcoma) ([place]) 04/11/2011  . 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 Human immunodeficiency virus (HIV) disease ([place]) 04/10/2009  . Lymphedema 04/10/2009  . SYPHILIS 04/06/2009    Otelia Limes, PTA 08/08/2018, 8:45 AM  [place], [place], [address] Phone: [phone]   Fax:  [phone]  Name: Ubaldo Daywalt MRN: [id number] Date of Birth: [birth year]

## 2018-08-10 ENCOUNTER — Ambulatory Visit (INDEPENDENT_AMBULATORY_CARE_PROVIDER_SITE_OTHER): Payer: PPO | Admitting: Primary Care

## 2018-08-10 ENCOUNTER — Encounter: Payer: Self-pay | Admitting: Primary Care

## 2018-08-10 ENCOUNTER — Encounter: Payer: PPO | Admitting: Rehabilitation

## 2018-08-10 VITALS — BP 120/74 | HR 88 | Temp 98.2°F | Ht 73.0 in | Wt 283.0 lb

## 2018-08-10 DIAGNOSIS — I10 Essential (primary) hypertension: Secondary | ICD-10-CM

## 2018-08-10 DIAGNOSIS — Z0001 Encounter for general adult medical examination with abnormal findings: Secondary | ICD-10-CM | POA: Diagnosis not present

## 2018-08-10 DIAGNOSIS — K219 Gastro-esophageal reflux disease without esophagitis: Secondary | ICD-10-CM

## 2018-08-10 DIAGNOSIS — B370 Candidal stomatitis: Secondary | ICD-10-CM

## 2018-08-10 DIAGNOSIS — Z0000 Encounter for general adult medical examination without abnormal findings: Secondary | ICD-10-CM | POA: Insufficient documentation

## 2018-08-10 DIAGNOSIS — N183 Chronic kidney disease, stage 3 unspecified: Secondary | ICD-10-CM

## 2018-08-10 DIAGNOSIS — E890 Postprocedural hypothyroidism: Secondary | ICD-10-CM | POA: Diagnosis not present

## 2018-08-10 DIAGNOSIS — E119 Type 2 diabetes mellitus without complications: Secondary | ICD-10-CM

## 2018-08-10 DIAGNOSIS — J4540 Moderate persistent asthma, uncomplicated: Secondary | ICD-10-CM

## 2018-08-10 DIAGNOSIS — J3089 Other allergic rhinitis: Secondary | ICD-10-CM

## 2018-08-10 DIAGNOSIS — I890 Lymphedema, not elsewhere classified: Secondary | ICD-10-CM

## 2018-08-10 DIAGNOSIS — B20 Human immunodeficiency virus [HIV] disease: Secondary | ICD-10-CM | POA: Diagnosis not present

## 2018-08-10 DIAGNOSIS — J302 Other seasonal allergic rhinitis: Secondary | ICD-10-CM | POA: Diagnosis not present

## 2018-08-10 DIAGNOSIS — J4550 Severe persistent asthma, uncomplicated: Secondary | ICD-10-CM

## 2018-08-10 DIAGNOSIS — H1010 Acute atopic conjunctivitis, unspecified eye: Secondary | ICD-10-CM

## 2018-08-10 LAB — POCT GLYCOSYLATED HEMOGLOBIN (HGB A1C): Hemoglobin A1C: 6 % — AB (ref 4.0–5.6)

## 2018-08-10 MED ORDER — METFORMIN HCL ER 500 MG PO TB24: ORAL_TABLET | ORAL | 3 refills | Status: DC

## 2018-08-10 MED ORDER — AMLODIPINE BESYLATE 10 MG PO TABS: 10.0000 mg | ORAL_TABLET | Freq: Every day | ORAL | 3 refills | Status: DC

## 2018-08-10 MED ORDER — CLOTRIMAZOLE 10 MG MT TROC: 10.0000 mg | Freq: Every day | OROMUCOSAL | 0 refills | Status: DC

## 2018-08-10 MED ORDER — POTASSIUM CHLORIDE CRYS ER 20 MEQ PO TBCR: 20.0000 meq | EXTENDED_RELEASE_TABLET | Freq: Every day | ORAL | 3 refills | Status: DC

## 2018-08-10 MED ORDER — HYDROCHLOROTHIAZIDE 25 MG PO TABS: ORAL_TABLET | ORAL | 3 refills | Status: DC

## 2018-08-10 NOTE — Assessment & Plan Note (Signed)
Overall stable. Continue to monitor.  

## 2018-08-10 NOTE — Assessment & Plan Note (Signed)
Recently treated with oral nystatin, little to no improvement. Oral candida noticed on exam today. Prescription for clotrimazole troches sent to pharmacy.  He will update.

## 2018-08-10 NOTE — Assessment & Plan Note (Signed)
Compliant to levothyroxine every morning, taking appropriately. Following with endocrinology, continue levothyroxine 150 mcg.

## 2018-08-10 NOTE — Assessment & Plan Note (Signed)
A1c today of 6.0 which is well controlled. We will continue metformin as prescribed.  Recent LDL at goal.  Need to consider statin for CVD protection. Urine microalbumin at next visit. Pneumonia vaccination up-to-date.  Follow-up in 6 months for diabetes check.

## 2018-08-10 NOTE — Assessment & Plan Note (Signed)
Overall doing well on current regimen.  Uses Advair daily and Flovent only if needed.  Using albuterol as needed as well. No wheezing on exam.  Continue current regimen.

## 2018-08-10 NOTE — Assessment & Plan Note (Signed)
Doing well on Nexium.  Continue same.

## 2018-08-10 NOTE — Assessment & Plan Note (Signed)
Following with infectious disease and compliant to Triumeq. Recent CD4 count undetectable.

## 2018-08-10 NOTE — Assessment & Plan Note (Signed)
Undergoing treatment per hematology. Will be using compression stockings.  Also using pumps at home.

## 2018-08-10 NOTE — Progress Notes (Signed)
Subjective:    Patient ID: Steven [name], male    DOB: 11/18/1980, 38 y.o.   MRN: [id number]  HPI  Mr. Platz is a 38 year old male who presents today for complete physical.  Immunizations: -Tetanus: Completed in 2015 -Influenza: Completed this season -Pneumonia: Completed in 2019  Diet: He endorses a fair diet Breakfast: Skips, fruit, cereal, oatmeal Lunch: Left overs Dinner: Chicken (baked/grilled), vegetable, starch Snacks: Chips, fruit, veggies Desserts: Twice weekly  Beverages: Juice, water, diet soda, social alcohol   Exercise: He is walking at work often. He exercises at the gym three times weekly Eye exam: Completed in August 2019 Dental exam: No recent exam  BP Readings from Last 3 Encounters:  08/10/18 120/74  07/16/18 (!) 153/92  06/14/18 120/76     Review of Systems  Constitutional: Negative for unexpected weight change.  HENT: Negative for rhinorrhea.   Respiratory: Negative for cough.        Occasional shortness of breath with asthma  Cardiovascular: Negative for chest pain.  Gastrointestinal: Negative for constipation and diarrhea.  Genitourinary: Negative for difficulty urinating.  Musculoskeletal: Positive for arthralgias. Negative for myalgias.       Right 5th digit pain to MCP joint. Denies swelling.  Skin: Negative for rash.  Allergic/Immunologic: Positive for environmental allergies.  Neurological: Negative for dizziness, numbness and headaches.  Psychiatric/Behavioral: Negative for suicidal ideas.       Past Medical History:  Diagnosis Date  . Asthma   . Cancer ([place])   . Diabetes mellitus without complication ([place])   . HIV positive ([place]) 03/23/09   Genotype Y181C  . HTN (hypertension)   . Kaposi's sarcoma   . Syphilis 03/23/09-   1:2     Social History   Socioeconomic History  . Marital status: Single    Spouse name: Not on file  . Number of children: Not on file  . Years of education: Not on file  . Highest education  level: Not on file  Occupational History  . Not on file  Social Needs  . Financial resource strain: Not on file  . Food insecurity:    Worry: Not on file    Inability: Not on file  . Transportation needs:    Medical: Not on file    Non-medical: Not on file  Tobacco Use  . Smoking status: Never Smoker  . Smokeless tobacco: Never Used  Substance and Sexual Activity  . Alcohol use: Yes    Alcohol/week: 0.0 standard drinks    Comment: socially - less now  . Drug use: No  . Sexual activity: Not Currently    Partners: Male    Birth control/protection: Condom    Comment: pt. declined condoms  Lifestyle  . Physical activity:    Days per week: Not on file    Minutes per session: Not on file  . Stress: Not on file  Relationships  . Social connections:    Talks on phone: Not on file    Gets together: Not on file    Attends religious service: Not on file    Active member of club or organization: Not on file    Attends meetings of clubs or organizations: Not on file    Relationship status: Not on file  . Intimate partner violence:    Fear of current or ex partner: Not on file    Emotionally abused: Not on file    Physically abused: Not on file    Forced sexual activity: Not on  file  Other Topics Concern  . Not on file  Social History Narrative   Single.    No children.   Works in Therapist, art   Enjoys DJ, traveling.     Past Surgical History:  Procedure Laterality Date  . BRAIN SURGERY    . IR GENERIC HISTORICAL  02/12/2016   IR REMOVAL TUN ACCESS W/ PORT W/O FL MOD SED 02/12/2016 [name], MD [facility]    Family History  Problem Relation Age of Onset  . Pancreatic cancer Mother   . Cancer Mother   . Diabetes Paternal Grandmother   . Thyroid disease Paternal Grandmother     Allergies  Allergen Reactions  . Lisinopril Swelling    Swelling of lower lip while on lisinopril  . Penicillins Hives and Swelling    REACTION: rash and swelling  . Levaquin  [Levofloxacin In D5w] Rash  . Tessalon [Benzonatate] Rash  . Aspirin Nausea Only    Current Outpatient Medications on File Prior to Visit  Medication Sig Dispense Refill  . albuterol (PROVENTIL HFA;VENTOLIN HFA) 108 (90 Base) MCG/ACT inhaler INHALE 2 PUFFS BY MOUTH EVERY 4 TO 6 HOURS AS NEEDED FOR COUGH OR WHEEZING 42.5 g 0  . esomeprazole (NEXIUM) 40 MG capsule TAKE ONE CAPSULE BY MOUTH ONCE DAILY AS DIRECTED (Patient taking differently: Take 40 mg by mouth daily. ) 30 capsule 0  . fexofenadine (ALLEGRA) 180 MG tablet Take 1 tablet (180 mg total) by mouth daily. 15 tablet 0  . FLOVENT HFA 110 MCG/ACT inhaler INHALE 2 PUFFS INTO THE LUNGS TWICE DAILY 12 g 2  . fluticasone (FLONASE) 50 MCG/ACT nasal spray SHAKE LIQUID AND USE 2 SPRAYS IN EACH NOSTRIL DAILY 16 g 5  . fluticasone-salmeterol (ADVAIR HFA) 230-21 MCG/ACT inhaler USE 2 INHALATIONS BY MOUTH TWICE DAILY 12 g 5  . guaiFENesin (MUCINEX) 600 MG 12 hr tablet Take 1 tablet (600 mg total) by mouth 2 (two) times daily. 15 tablet 0  . LANCETS MICRO THIN 33G MISC USE TO TEST BLOOD SUGAR ONCE A DAY 100 each 2  . levothyroxine (SYNTHROID, LEVOTHROID) 150 MCG tablet TAKE 1 TABLET BY MOUTH DAILY (Patient taking differently: Take 150 mcg by mouth daily before breakfast. Take 1 tablet by mouth daily.) 30 tablet 0  . montelukast (SINGULAIR) 10 MG tablet TAKE 1 TABLET(10 MG) BY MOUTH DAILY 90 tablet 0  . Multiple Vitamin (MULTIVITAMIN) tablet Take 1 tablet by mouth daily.    Marland Kitchen nystatin (MYCOSTATIN) 100000 UNIT/ML suspension 5 ml by mouth 4 times daily. swish and swallow or spit 110 mL 0  . PAZEO 0.7 % SOLN Place 1 drop into both eyes daily. 1 Bottle 5  . TRIUMEQ 600-50-300 MG tablet TAKE 1 TABLET BY MOUTH EVERY DAY 30 tablet 6  . TRUE METRIX BLOOD GLUCOSE TEST test strip USE AS DIRECTED FOR THREE TIMES DAILY TESTING OF BLOOD GLUCOSE 300 each 2   Current Facility-Administered Medications on File Prior to Visit  Medication Dose Route Frequency Provider  Last Rate Last Dose  . omalizumab Arvid Right) injection 300 mg  300 mg Subcutaneous Q14 Days Kozlow, Donnamarie Poag, MD   300 mg at 08/02/18 1645    BP 120/74   Pulse 88   Temp 98.2 F (36.8 C) (Oral)   Ht 6\' 1"  (1.854 m)   Wt 283 lb (128.4 kg)   SpO2 97%   BMI 37.34 kg/m    Objective:   Physical Exam  Constitutional: He is oriented to person, place, and time.  He appears well-nourished.  HENT:  Mouth/Throat: No oropharyngeal exudate.  Oral candidia noted to tongue  Eyes: Pupils are equal, round, and reactive to light. EOM are normal.  Neck: Neck supple. No thyromegaly present.  Cardiovascular: Normal rate and regular rhythm.  Respiratory: Effort normal and breath sounds normal.  GI: Soft. Bowel sounds are normal. There is no abdominal tenderness.  Musculoskeletal: Normal range of motion.  Neurological: He is alert and oriented to person, place, and time.  Skin: Skin is warm and dry.  Psychiatric: He has a normal mood and affect.           Assessment & Plan:

## 2018-08-10 NOTE — Assessment & Plan Note (Addendum)
Following with asthma and allergy. Managed on Advair mostly, uses Flovent if needed.

## 2018-08-10 NOTE — Assessment & Plan Note (Signed)
Immunizations up-to-date. Recommended regular exercise and to continue to work on a healthy diet. Exam today with oral candidia as mentioned. Labs are up-to-date therefore not needed.  Point-of-care A1c completed today and is stable. Follow-up in 1 year for CPE.

## 2018-08-10 NOTE — Assessment & Plan Note (Addendum)
Stable in the office today, continue current regimen. BMP reviewed from prior labs.  Stable.

## 2018-08-10 NOTE — Patient Instructions (Signed)
Stop by the lab prior to leaving today. I will notify you of your results once received.   I sent refills of your medications to your pharmacy.  Use the clotrimazole troches five times daily for one week for oral thrush.  Start exercising. You should be getting 150 minutes of moderate intensity exercise weekly.  It is important that you improve your diet. Please limit carbohydrates in the form of white bread, rice, pasta, sweets, fast food, fried food, sugary drinks, etc. Increase your consumption of fresh fruits and vegetables, whole grains, lean protein.  Ensure you are consuming 64 ounces of water daily.  Please schedule a follow up appointment in 6 months for diabetes check.   It was a pleasure to see you today!   Preventive Care 18-39 Years, Male Preventive care refers to lifestyle choices and visits with your health care provider that can promote health and wellness. What does preventive care include?   A yearly physical exam. This is also called an annual well check.  Dental exams once or twice a year.  Routine eye exams. Ask your health care provider how often you should have your eyes checked.  Personal lifestyle choices, including: ? Daily care of your teeth and gums. ? Regular physical activity. ? Eating a healthy diet. ? Avoiding tobacco and drug use. ? Limiting alcohol use. ? Practicing safe sex. What happens during an annual well check? The services and screenings done by your health care provider during your annual well check will depend on your age, overall health, lifestyle risk factors, and family history of disease. Counseling Your health care provider may ask you questions about your:  Alcohol use.  Tobacco use.  Drug use.  Emotional well-being.  Home and relationship well-being.  Sexual activity.  Eating habits.  Work and work Statistician. Screening You may have the following tests or measurements:  Height, weight, and BMI.  Blood  pressure.  Lipid and cholesterol levels. These may be checked every 5 years starting at age 29.  Diabetes screening. This is done by checking your blood sugar (glucose) after you have not eaten for a while (fasting).  Skin check.  Hepatitis C blood test.  Hepatitis B blood test.  Sexually transmitted disease (STD) testing. Discuss your test results, treatment options, and if necessary, the need for more tests with your health care provider. Vaccines Your health care provider may recommend certain vaccines, such as:  Influenza vaccine. This is recommended every year.  Tetanus, diphtheria, and acellular pertussis (Tdap, Td) vaccine. You may need a Td booster every 10 years.  Varicella vaccine. You may need this if you have not been vaccinated.  HPV vaccine. If you are 61 or younger, you may need three doses over 6 months.  Measles, mumps, and rubella (MMR) vaccine. You may need at least one dose of MMR.You may also need a second dose.  Pneumococcal 13-valent conjugate (PCV13) vaccine. You may need this if you have certain conditions and have not been vaccinated.  Pneumococcal polysaccharide (PPSV23) vaccine. You may need one or two doses if you smoke cigarettes or if you have certain conditions.  Meningococcal vaccine. One dose is recommended if you are age 68-21 years and a first-year college student living in a residence hall, or if you have one of several medical conditions. You may also need additional booster doses.  Hepatitis A vaccine. You may need this if you have certain conditions or if you travel or work in places where you may be exposed  to hepatitis A.  Hepatitis B vaccine. You may need this if you have certain conditions or if you travel or work in places where you may be exposed to hepatitis B.  Haemophilus influenzae type b (Hib) vaccine. You may need this if you have certain risk factors. Talk to your health care provider about which screenings and vaccines you  need and how often you need them. This information is not intended to replace advice given to you by your health care provider. Make sure you discuss any questions you have with your health care provider. Document Released: 08/23/2001 Document Revised: 02/07/2017 Document Reviewed: 04/28/2015 Elsevier Interactive Patient Education  2019 [employer].

## 2018-08-10 NOTE — Assessment & Plan Note (Signed)
Overall doing well on current regimen.  Continue Singulair, Flonase, Allegra, inhalers as prescribed.

## 2018-08-13 ENCOUNTER — Ambulatory Visit: Payer: PPO | Attending: Oncology

## 2018-08-13 DIAGNOSIS — I890 Lymphedema, not elsewhere classified: Secondary | ICD-10-CM | POA: Diagnosis not present

## 2018-08-13 NOTE — Therapy (Signed)
[place], [place], [address] Phone: [phone]   Fax:  [phone]  Physical Therapy Treatment  Patient Details  Name: Steven [name] MRN: [id number] Date of Birth: [birth year] Referring Provider (PT): Dr. Benay Spice    Encounter Date: 08/13/2018  PT End of Session - 08/13/18 0846    Visit Number  7    Number of Visits  19    Date for PT Re-Evaluation  08/30/18    PT Start Time  0808   Ptarrived at wring time but had cancellation and was able to work him in   PT Stop Time  0839    PT Time Calculation (min)  31 min    Activity Tolerance  Patient tolerated treatment well    Behavior During Therapy  [facility] for tasks assessed/performed       Past Medical History:  Diagnosis Date  . Asthma   . Cancer ([place])   . Diabetes mellitus without complication ([place])   . HIV positive ([place]) 03/23/09   Genotype Y181C  . HTN (hypertension)   . Kaposi's sarcoma   . Syphilis 03/23/09-   1:2    Past Surgical History:  Procedure Laterality Date  . BRAIN SURGERY    . IR GENERIC HISTORICAL  02/12/2016   IR REMOVAL TUN ACCESS W/ PORT W/O FL MOD SED 02/12/2016 [name], MD [facility]    There were no vitals filed for this visit.  Subjective Assessment - 08/13/18 0808    Subjective  Bandages stayed on until Saturday. I couldn't make my Friday appt due to an emergency at work and I had doubled booked myself.     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  No/denies                  Outpatient Re[facility]  Lymphedema Life Impact Scale Total Score  20.59 %           [facility] Adult PT Treatment/Exercise - 08/13/18  0001      Manual Therapy   Manual Lymphatic Drainage (MLD)  In supine: short neck, superficial and deep abdominals, Rt axillary nodes and Rt inguino-axillary anastomosis, then lateral thigh, medial thigh, knee, and lower leg working proximally to distally and then retracing all steps towards pathway.    Compression Bandaging  applied massage cream and then thick stockinette, artiflex from foot to knee then 1-6cm, 1-8 cm, 1-10 cm, and 1-12 cm from foot to knee                  PT Long Term Goals - 08/03/18 0843      PT LONG TERM GOAL #1   Title  Pt will have reduction of right lower leg at 10 cm proximal to floor by 2 cm    Status  On-going      PT LONG TERM GOAL #2   Title  pt will have reduction of left lower leg at 10 cm proximal to floor by 2 cm     Status  On-going      PT LONG TERM GOAL #3   Title  pt will reprort he is able to manage his  lymphedema at home with elevation, exercise, compression pump and garments     Status  Partially Met            Plan - 08/13/18 0846    Clinical Impression Statement  Continued with complete decongestive therapy to Rt LE. Pt arrived [place] thinking he had an appt but did not, however therapists pt cancelled and was bale to work him in. Pt is to be measured for flat knit compression stockings on Feb 10.     Rehab Potential  Good    PT Frequency  3x / week    PT Duration  6 weeks    PT Treatment/Interventions  ADLs/Self Care Home Management;DME Instruction;Therapeutic exercise;Patient/family education;Orthotic Fit/Training;Manual techniques;Compression bandaging;Manual lymph drainage;Taping    PT Next Visit Plan  continue complete decongestive therapy to Rt LE    Consulted and Agree with Plan of Care  Patient       Patient will benefit from skilled therapeutic intervention in order to improve the following deficits and impairments:  Increased edema, Decreased knowledge of use of DME  Visit Diagnosis: Lymphedema, not elsewhere  classified     Problem List Patient Active Problem List   Diagnosis Date Noted  . Encounter for annual general medical examination with abnormal findings in adult 08/10/2018  . Asthma, severe persistent, well-controlled 06/15/2018  . Allergic reaction 06/15/2018  . Food allergy 06/15/2018  . Seasonal and perennial allergic rhinoconjunctivitis 06/15/2018  . AIDS ([place]) 02/09/2018  . GERD (gastroesophageal reflux disease) 03/28/2017  . Asthma 03/28/2017  . Type 2 diabetes mellitus ([place]) 03/25/2015  . CKD (chronic kidney disease) stage 3, GFR 30-59 ml/min (HCC) 03/23/2015  . Thrush, oral 03/23/2015  . Essential hypertension 11/24/2014  . Post-nasal drip 02/05/2014  . 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 Arthralgia of elbow, right 07/29/2013  . Colitis 05/27/2011  . KS (Kaposi's sarcoma) ([place]) 04/11/2011  . 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 Human immunodeficiency virus (HIV) disease ([place]) 04/10/2009  . Lymphedema 04/10/2009  . SYPHILIS 04/06/2009    Otelia Limes, PTA 08/13/2018, 8:50 AM  [place], [place], [address] Phone: [phone]   Fax:  [phone]  Name: Cortez Flippen MRN: [id number] Date of Birth: 1981-03-02

## 2018-08-15 ENCOUNTER — Ambulatory Visit: Payer: PPO

## 2018-08-17 ENCOUNTER — Ambulatory Visit: Payer: PPO | Admitting: Physical Therapy

## 2018-08-20 ENCOUNTER — Ambulatory Visit: Payer: PPO

## 2018-08-20 DIAGNOSIS — I890 Lymphedema, not elsewhere classified: Secondary | ICD-10-CM

## 2018-08-20 NOTE — Therapy (Addendum)
[place], [place], [address] Phone: [phone]   Fax:  [phone]  Physical Therapy Treatment  Patient Details  Name: Steven [name] MRN: [id number] Date of Birth: 07-31-1980 Referring Provider (PT): Dr. Benay Spice    Encounter Date: 08/20/2018  PT End of Session - 08/20/18 0857    Visit Number  8    Number of Visits  19    Date for PT Re-Evaluation  08/30/18    PT Start Time  0806   pt arrived late   PT Stop Time  0847    PT Time Calculation (min)  41 min    Activity Tolerance  Patient tolerated treatment well    Behavior During Therapy  [facility] for tasks assessed/performed       Past Medical History:  Diagnosis Date  . Asthma   . Cancer ([place])   . Diabetes mellitus without complication ([place])   . HIV positive ([place]) 03/23/09   Genotype Y181C  . HTN (hypertension)   . Kaposi's sarcoma   . Syphilis 03/23/09-   1:2    Past Surgical History:  Procedure Laterality Date  . BRAIN SURGERY    . IR GENERIC HISTORICAL  02/12/2016   IR REMOVAL TUN ACCESS W/ PORT W/O FL MOD SED 02/12/2016 [name], MD [facility]    There were no vitals filed for this visit.  Subjective Assessment - 08/20/18 0808    Subjective  I'm sorry I missed last week, I had alot going on at work.     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  No/denies                  Outpatient Re[facility] from [facility]19/2018 [facility]  Lymphedema Life Impact Scale Total Score  20.59 %           [facility] Adult PT Treatment/Exercise - 08/20/18 0001      Manual Therapy   Edema Management  Measured pt for off the shelf [employer]is ExoStrong. His sizing is Rt Surveyor, quantity and  [employer], tall length. Instructed pt how he can order these online which he plans to do today. Did not bandage as pt reports his lymphedema has been well maintained since removing bandages last Wednesday and again, plans to order these garments today and hopefuly receive them in next few days.     Manual Lymphatic Drainage (MLD)  In supine: short neck, superficial and deep abdominals, Rt axillary nodes and Rt inguino-axillary anastomosis, then lateral thigh, medial thigh, knee, and lower leg working proximally to distally and then retracing all steps towards pathway.                  PT Long Term Goals - 08/03/18 0843      PT LONG TERM GOAL #1   Title  Pt will have reduction of right lower leg at 10 cm proximal to floor by 2 cm    Status  On-going      PT LONG TERM GOAL #2   Title  pt will have reduction of left lower leg at 10 cm proximal to floor by 2 cm     Status  On-going  PT LONG TERM GOAL #3   Title  pt will reprort he is able to manage his lymphedema at home with elevation, exercise, compression pump and garments     Status  Partially Met            Plan - 08/20/18 0858    Clinical Impression Statement  Discussed compression garment options with pt as he is unable to make appt to be fitted today. After measuring his circumference it was determined he will fit into Solaris ExoStrong (Rt XXL, Lt XL length tall) so he would like to just order these online today at his convenience. Issued handout with website [email or web address] if he wanted to use them, with size and garment type written on handout. Pt wanted to hold off on bandaging today as he should have his garments in next few days and does not feel his swelling has increased over past few days.     Rehab Potential  Good    PT Frequency  3x / week    PT Duration  6 weeks    PT Treatment/Interventions  ADLs/Self Care Home Management;DME Instruction;Therapeutic exercise;Patient/family education;Orthotic  Fit/Training;Manual techniques;Compression bandaging;Manual lymph drainage;Taping    PT Next Visit Plan  Reassess all goals. Assess new garments fit and if determined well fitting then probable D/C next.     Consulted and Agree with Plan of Care  Patient       Patient will benefit from skilled therapeutic intervention in order to improve the following deficits and impairments:  Increased edema, Decreased knowledge of use of DME  Visit Diagnosis: Lymphedema, not elsewhere classified     Problem List Patient Active Problem List   Diagnosis Date Noted  . Encounter for annual general medical examination with abnormal findings in adult 08/10/2018  . Asthma, severe persistent, well-controlled 06/15/2018  . Allergic reaction 06/15/2018  . Food allergy 06/15/2018  . Seasonal and perennial allergic rhinoconjunctivitis 06/15/2018  . AIDS ([place]) 02/09/2018  . GERD (gastroesophageal reflux disease) 03/28/2017  . Asthma 03/28/2017  . Type 2 diabetes mellitus ([place]) 03/25/2015  . CKD (chronic kidney disease) stage 3, GFR 30-59 ml/min (HCC) 03/23/2015  . Thrush, oral 03/23/2015  . Essential hypertension 11/24/2014  . Post-nasal drip 02/05/2014  . 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 Arthralgia of elbow, right 07/29/2013  . Colitis 05/27/2011  . KS (Kaposi's sarcoma) ([place]) 04/11/2011  . 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 Human immunodeficiency virus (HIV) disease ([place]) 04/10/2009  . Lymphedema 04/10/2009  . SYPHILIS 04/06/2009    Otelia Limes, PTA 08/20/2018, 9:05 AM  [place], [place], [address] Phone: [phone]   Fax:  [phone]  Name: Steven [name] MRN: [id number] Date of Birth: 02/26/1981  PHYSICAL THERAPY DISCHARGE SUMMARY  Visits from Start of Care: 8  Current functional level related to goals / functional outcomes: See above  note   Remaining deficits: Chronic lymphedema   Education / Equipment: Self care Plan: Patient agrees to discharge.  Patient goals were met. Patient is being discharged due to meeting the stated rehab goals.  ?????      Shan Levans, PT

## 2018-08-21 ENCOUNTER — Other Ambulatory Visit: Payer: Self-pay | Admitting: Infectious Diseases

## 2018-08-21 DIAGNOSIS — B20 Human immunodeficiency virus [HIV] disease: Secondary | ICD-10-CM

## 2018-08-23 ENCOUNTER — Encounter: Payer: Self-pay | Admitting: Allergy

## 2018-08-23 ENCOUNTER — Ambulatory Visit (INDEPENDENT_AMBULATORY_CARE_PROVIDER_SITE_OTHER): Payer: PPO | Admitting: Allergy

## 2018-08-23 VITALS — BP 122/70 | HR 80 | Resp 18

## 2018-08-23 DIAGNOSIS — J302 Other seasonal allergic rhinitis: Secondary | ICD-10-CM | POA: Diagnosis not present

## 2018-08-23 DIAGNOSIS — J4550 Severe persistent asthma, uncomplicated: Secondary | ICD-10-CM | POA: Diagnosis not present

## 2018-08-23 DIAGNOSIS — B20 Human immunodeficiency virus [HIV] disease: Secondary | ICD-10-CM | POA: Diagnosis not present

## 2018-08-23 DIAGNOSIS — H1010 Acute atopic conjunctivitis, unspecified eye: Secondary | ICD-10-CM | POA: Diagnosis not present

## 2018-08-23 DIAGNOSIS — J3089 Other allergic rhinitis: Secondary | ICD-10-CM | POA: Diagnosis not present

## 2018-08-23 DIAGNOSIS — K219 Gastro-esophageal reflux disease without esophagitis: Secondary | ICD-10-CM

## 2018-08-23 DIAGNOSIS — Z91018 Allergy to other foods: Secondary | ICD-10-CM | POA: Diagnosis not present

## 2018-08-23 DIAGNOSIS — T7840XD Allergy, unspecified, subsequent encounter: Secondary | ICD-10-CM | POA: Diagnosis not present

## 2018-08-23 NOTE — Assessment & Plan Note (Signed)
Past history - 2015 skin testing was positive to grass, ragweed, weed, mold, dust mites, cat and dog. Interim history - Well controlled with below regimen.  Take Flonase 1-2 sprays daily as needed for nasal congestion.   May use over the counter antihistamines such as Zyrtec (cetirizine), Claritin (loratadine), Allegra (fexofenadine), or Xyzal (levocetirizine) daily as needed.  May use pazeo eye drops daily as needed for itchy eyes.

## 2018-08-23 NOTE — Assessment & Plan Note (Signed)
Well-controlled with below regimen. Using albuterol about once a week.   Today's spirometry was better than the last one.   Daily controller medication(s):Advair 230 2 puffs twice a day with spacer and rinse mouth afterwards + Xolair 300mg  every 14 days   Prior to physical activity:May use albuterol rescue inhaler 2 puffs 5 to 15 minutes prior to strenuous physical activities.  Rescue medications:May use albuterol rescue inhaler 2 puffs or nebulizer every 4 to 6 hours as needed for shortness of breath, chest tightness, coughing, and wheezing. Monitor frequency of use.   During upper respiratory infections: Start Qvar 80 2 puffs twice a day for 1-2 weeks.

## 2018-08-23 NOTE — Assessment & Plan Note (Signed)
Well-controlled with Nexium 40mg  daily. Continue medication.

## 2018-08-23 NOTE — Assessment & Plan Note (Signed)
Past history - 3 allergic reactions since the last visit. One episode most likely due to shellfish contact but other 2 episodes no triggers noted. Interim history - normal tryptase, no additional reactions. No issues with Xolair injections.   He most likely had cross-contamination with shellfish.   Monitor symptoms.   For mild symptoms you can take over the counter antihistamines such as Benadryl and monitor symptoms closely. If symptoms worsen or if you have severe symptoms including breathing issues, throat closure, significant swelling, whole body hives, severe diarrhea and vomiting, lightheadedness then inject epinephrine and seek immediate medical care afterwards.

## 2018-08-23 NOTE — Assessment & Plan Note (Signed)
Interim history -  2019 immunocap was slightly positive: alpha gal, wheat, corn, peanut, soy, egg, shellfish. Patient tolerates all of the above foods with no issues except for the shellfish.   Continue to avoid shellfish and mushroom.  Not necessary to avoid any other foods. Bloodwork showed irrelevant sensitizations.   For mild symptoms you can take over the counter antihistamines such as Benadryl and monitor symptoms closely. If symptoms worsen or if you have severe symptoms including breathing issues, throat closure, significant swelling, whole body hives, severe diarrhea and vomiting, lightheadedness then inject epinephrine and seek immediate medical care afterwards.

## 2018-08-23 NOTE — Patient Instructions (Addendum)
Asthma, severe persistent, well-controlled Well-controlled with below regimen.  Today's spirometry was better than the last one.   Daily controller medication(s):Advair 230 2 puffs twice a day with spacer and rinse mouth afterwards + Xolair 300mg  every 14 days   Prior to physical activity:May use albuterol rescue inhaler 2 puffs 5 to 15 minutes prior to strenuous physical activities.  Rescue medications:May use albuterol rescue inhaler 2 puffs or nebulizer every 4 to 6 hours as needed for shortness of breath, chest tightness, coughing, and wheezing. Monitor frequency of use.   During upper respiratory infections: Start Qvar 80 2 puffs twice a day for 1-2 weeks.  Seasonal and perennial allergic rhinoconjunctivitis Past history - 2015 skin testing was positive to grass, ragweed, weed, mold, dust mites, cat and dog.  Take Flonase 1-2 sprays daily as needed for nasal congestion.   May use over the counter antihistamines such as Zyrtec (cetirizine), Claritin (loratadine), Allegra (fexofenadine), or Xyzal (levocetirizine) daily as needed.  May use pazeo eye drops daily as needed for itchy eyes.   Food allergy  Continue to avoid shellfish and mushroom.  No need to avoid any other foods at this time. Bloodwork was probably false positive results.   For mild symptoms you can take over the counter antihistamines such as Benadryl and monitor symptoms closely. If symptoms worsen or if you have severe symptoms including breathing issues, throat closure, significant swelling, whole body hives, severe diarrhea and vomiting, lightheadedness then inject epinephrine and seek immediate medical care afterwards.  Allergic reaction  Monitor symptoms.   GERD (gastroesophageal reflux disease)  Continue Nexium 40mg  daily.  Follow up in 3 months

## 2018-08-23 NOTE — Progress Notes (Signed)
Follow Up Note  RE: Steven [name] MRN: [id number] DOB: [birth year] Date of Office Visit: 08/23/2018  Referring provider: Pleas [name], NP Primary care provider: Pleas [name], NP  Chief Complaint: Asthma (only using albuterol 1 or 2 times a week, only if needed though. no other problems. He would like discuss his recent labs. )  History of Present Illness: I had the pleasure of seeing Steven [name] for a follow up visit at the [facility] [place] [facility] [place] on 08/23/2018. He is a 38 y.o. male, who is being followed for asthma, food allergy, allergic rhino conjunctivitis, allergic reactions, GERD. Today he is here for regular follow up visit. His previous allergy office visit was on 06/14/2018 with Dr. Maudie Mercury.   Asthma, severe persistent, well-controlled Using albuterol once a week for wheezing and chest tightness with good benefit. No Er/urgent care visit or prednisone use.  Currently on Xolair 300mg  every 14 days and Advair 230 2 puffs BID with good benefit.  Thrush, oral Had to use diflucan due to thrush recurrence.   Seasonal and perennial allergic rhinoconjunctivitis Using Flonase as needed, allegra daily and Singulair daily with good benefit.   Food allergy Patient had hamburger about 1 week ago with no issues. Limiting wheat and corn due to diabetes but never had reaction to them.  No issues with peanut butter, soy, egg, lactaid milk.   Currently avoiding shellfish and mushroom.   Allergic reaction No more reactions since the last visit.  GERD (gastroesophageal reflux disease) Well controlled with Nexium 40mg  daily.  Assessment and Plan: Steven [name] is a 38 y.o. male with: Asthma, severe persistent, well-controlled Well-controlled with below regimen. Using albuterol about once a week.   Today's spirometry was better than the last one.   Daily controller medication(s):Advair 230 2 puffs twice a day with spacer and rinse mouth afterwards + Xolair 300mg  every 14  days   Prior to physical activity:May use albuterol rescue inhaler 2 puffs 5 to 15 minutes prior to strenuous physical activities.  Rescue medications:May use albuterol rescue inhaler 2 puffs or nebulizer every 4 to 6 hours as needed for shortness of breath, chest tightness, coughing, and wheezing. Monitor frequency of use.   During upper respiratory infections: Start Qvar 80 2 puffs twice a day for 1-2 weeks.  Seasonal and perennial allergic rhinoconjunctivitis Past history - 2015 skin testing was positive to grass, ragweed, weed, mold, dust mites, cat and dog. Interim history - Well controlled with below regimen.  Take Flonase 1-2 sprays daily as needed for nasal congestion.   May use over the counter antihistamines such as Zyrtec (cetirizine), Claritin (loratadine), Allegra (fexofenadine), or Xyzal (levocetirizine) daily as needed.  May use pazeo eye drops daily as needed for itchy eyes.   Food allergy Interim history -  2019 immunocap was slightly positive: alpha gal, wheat, corn, peanut, soy, egg, shellfish. Patient tolerates all of the above foods with no issues except for the shellfish.   Continue to avoid shellfish and mushroom.  Not necessary to avoid any other foods. Bloodwork showed irrelevant sensitizations.   For mild symptoms you can take over the counter antihistamines such as Benadryl and monitor symptoms closely. If symptoms worsen or if you have severe symptoms including breathing issues, throat closure, significant swelling, whole body hives, severe diarrhea and vomiting, lightheadedness then inject epinephrine and seek immediate medical care afterwards.  Allergic reaction Past history - 3 allergic reactions since the last visit. One episode most likely due to shellfish contact but  other 2 episodes no triggers noted. Interim history - normal tryptase, no additional reactions. No issues with Xolair injections.   He most likely had cross-contamination with shellfish.    Monitor symptoms.   For mild symptoms you can take over the counter antihistamines such as Benadryl and monitor symptoms closely. If symptoms worsen or if you have severe symptoms including breathing issues, throat closure, significant swelling, whole body hives, severe diarrhea and vomiting, lightheadedness then inject epinephrine and seek immediate medical care afterwards.  Gastroesophageal reflux disease Well-controlled with Nexium 40mg  daily. Continue medication.   Return in about 3 months (around 11/21/2018).  Diagnostics: Spirometry:  Tracings reviewed. His effort: Good reproducible efforts. FVC: 3.31L FEV1: 2.45L, 68% predicted FEV1/FVC ratio: 74% Interpretation: Spirometry consistent with possible restrictive disease - improved from previous one.  Please see scanned spirometry results for details.  Medication List:  Current Outpatient Medications  Medication Sig Dispense Refill  . albuterol (PROVENTIL HFA;VENTOLIN HFA) 108 (90 Base) MCG/ACT inhaler INHALE 2 PUFFS BY MOUTH EVERY 4 TO 6 HOURS AS NEEDED FOR COUGH OR WHEEZING 42.5 g 0  . amLODipine (NORVASC) 10 MG tablet Take 1 tablet (10 mg total) by mouth daily. For blood pressure. 90 tablet 3  . clotrimazole (MYCELEX) 10 MG troche Take 1 tablet (10 mg total) by mouth 5 (five) times daily. 35 tablet 0  . esomeprazole (NEXIUM) 40 MG capsule TAKE ONE CAPSULE BY MOUTH ONCE DAILY AS DIRECTED (Patient taking differently: Take 40 mg by mouth daily. ) 30 capsule 0  . fexofenadine (ALLEGRA) 180 MG tablet Take 1 tablet (180 mg total) by mouth daily. 15 tablet 0  . FLOVENT HFA 110 MCG/ACT inhaler INHALE 2 PUFFS INTO THE LUNGS TWICE DAILY 12 g 2  . fluticasone (FLONASE) 50 MCG/ACT nasal spray SHAKE LIQUID AND USE 2 SPRAYS IN EACH NOSTRIL DAILY 16 g 5  . fluticasone-salmeterol (ADVAIR HFA) 230-21 MCG/ACT inhaler USE 2 INHALATIONS BY MOUTH TWICE DAILY 12 g 5  . guaiFENesin (MUCINEX) 600 MG 12 hr tablet Take 1 tablet (600 mg total) by mouth 2  (two) times daily. 15 tablet 0  . hydrochlorothiazide (HYDRODIURIL) 25 MG tablet Take 1 tablet by mouth once daily for blood pressure. 90 tablet 3  . LANCETS MICRO THIN 33G MISC USE TO TEST BLOOD SUGAR ONCE A DAY 100 each 2  . levothyroxine (SYNTHROID, LEVOTHROID) 150 MCG tablet TAKE 1 TABLET BY MOUTH DAILY (Patient taking differently: Take 150 mcg by mouth daily before breakfast. Take 1 tablet by mouth daily.) 30 tablet 0  . metFORMIN (GLUCOPHAGE-XR) 500 MG 24 hr tablet Take 2 tablets in the morning and 1 tablet in the evening for diabetes. 270 tablet 3  . montelukast (SINGULAIR) 10 MG tablet TAKE 1 TABLET(10 MG) BY MOUTH DAILY 90 tablet 0  . Multiple Vitamin (MULTIVITAMIN) tablet Take 1 tablet by mouth daily.    Marland Kitchen nystatin (MYCOSTATIN) 100000 UNIT/ML suspension 5 ml by mouth 4 times daily. swish and swallow or spit 110 mL 0  . PAZEO 0.7 % SOLN Place 1 drop into both eyes daily. 1 Bottle 5  . potassium chloride SA (K-DUR,KLOR-CON) 20 MEQ tablet Take 1 tablet (20 mEq total) by mouth daily. 90 tablet 3  . TRIUMEQ 600-50-300 MG tablet TAKE 1 TABLET BY MOUTH DAILY 90 tablet 0  . TRUE METRIX BLOOD GLUCOSE TEST test strip USE AS DIRECTED FOR THREE TIMES DAILY TESTING OF BLOOD GLUCOSE 300 each 2   Current Facility-Administered Medications  Medication Dose Route Frequency Provider Last Rate  Last Dose  . omalizumab Arvid Right) injection 300 mg  300 mg Subcutaneous Q14 Days Jiles Prows, MD   300 mg at 08/23/18 1744   Allergies: Allergies  Allergen Reactions  . Lisinopril Swelling    Swelling of lower lip while on lisinopril  . Penicillins Hives and Swelling    REACTION: rash and swelling  . Levaquin [Levofloxacin In D5w] Rash  . Tessalon [Benzonatate] Rash  . Aspirin Nausea Only   I reviewed his past medical history, social history, family history, and environmental history and no significant changes have been reported from previous visit on 06/14/2018.  Review of Systems  Constitutional:  Negative for appetite change, chills, fever and unexpected weight change.  HENT: Negative for congestion and rhinorrhea.   Eyes: Negative for itching.  Respiratory: Negative for cough, chest tightness, shortness of breath and wheezing.   Gastrointestinal: Negative for abdominal pain.  Skin: Negative for rash.  Allergic/Immunologic: Positive for environmental allergies and food allergies.  Neurological: Negative for headaches.   Objective: BP 122/70 (BP Location: Left Arm, Patient Position: Sitting, Cuff Size: Large)   Pulse 80   Resp 18   SpO2 97%  There is no height or weight on file to calculate BMI. Physical Exam  Constitutional: He is oriented to person, place, and time. He appears well-developed and well-nourished.  HENT:  Head: Normocephalic and atraumatic.  Right Ear: External ear normal.  Left Ear: External ear normal.  Nose: Nose normal.  Mouth/Throat: Oropharynx is clear and moist.  Eyes: Conjunctivae and EOM are normal.  Neck: Neck supple.  Cardiovascular: Normal rate, regular rhythm and normal heart sounds. Exam reveals no gallop and no friction rub.  No murmur heard. Pulmonary/Chest: Effort normal and breath sounds normal. He has no wheezes. He has no rales.  Lymphadenopathy:    He has no cervical adenopathy.  Neurological: He is alert and oriented to person, place, and time.  Skin: Skin is warm. No rash noted.  Psychiatric: He has a normal mood and affect. His behavior is normal.  Nursing note and vitals reviewed.  Previous notes and tests were reviewed. The plan was reviewed with the patient/family, and all questions/concerned were addressed.  It was my pleasure to see Steven [name] today and participate in his care. Please feel free to contact me with any questions or concerns.  Sincerely,  Rexene Alberts, DO Allergy & Immunology  [facility] office: [phone] [facility] office: [phone]

## 2018-08-24 ENCOUNTER — Encounter: Payer: PPO | Admitting: Physical Therapy

## 2018-08-31 ENCOUNTER — Encounter: Payer: PPO | Admitting: Physical Therapy

## 2018-09-05 ENCOUNTER — Ambulatory Visit: Payer: PPO

## 2018-09-06 ENCOUNTER — Ambulatory Visit (INDEPENDENT_AMBULATORY_CARE_PROVIDER_SITE_OTHER): Payer: PPO | Admitting: *Deleted

## 2018-09-06 DIAGNOSIS — J4550 Severe persistent asthma, uncomplicated: Secondary | ICD-10-CM | POA: Diagnosis not present

## 2018-09-07 ENCOUNTER — Encounter: Payer: Self-pay | Admitting: Infectious Diseases

## 2018-09-07 ENCOUNTER — Encounter: Payer: PPO | Admitting: Rehabilitation

## 2018-09-20 ENCOUNTER — Other Ambulatory Visit: Payer: Self-pay | Admitting: Allergy and Immunology

## 2018-09-20 ENCOUNTER — Ambulatory Visit (INDEPENDENT_AMBULATORY_CARE_PROVIDER_SITE_OTHER): Payer: PPO | Admitting: *Deleted

## 2018-09-20 DIAGNOSIS — J4550 Severe persistent asthma, uncomplicated: Secondary | ICD-10-CM | POA: Diagnosis not present

## 2018-10-01 ENCOUNTER — Ambulatory Visit (INDEPENDENT_AMBULATORY_CARE_PROVIDER_SITE_OTHER): Payer: PPO | Admitting: Primary Care

## 2018-10-01 ENCOUNTER — Encounter: Payer: Self-pay | Admitting: Primary Care

## 2018-10-01 DIAGNOSIS — J069 Acute upper respiratory infection, unspecified: Secondary | ICD-10-CM

## 2018-10-01 DIAGNOSIS — J4540 Moderate persistent asthma, uncomplicated: Secondary | ICD-10-CM

## 2018-10-01 DIAGNOSIS — B9789 Other viral agents as the cause of diseases classified elsewhere: Secondary | ICD-10-CM | POA: Diagnosis not present

## 2018-10-01 MED ORDER — HYDROCOD POLST-CPM POLST ER 10-8 MG/5ML PO SUER: 5.0000 mL | Freq: Two times a day (BID) | ORAL | 0 refills | Status: DC | PRN

## 2018-10-01 MED ORDER — PREDNISONE 20 MG PO TABS: ORAL_TABLET | ORAL | 0 refills | Status: DC

## 2018-10-01 NOTE — Patient Instructions (Signed)
Your symptoms are representative of a viral illness which will resolve on its own over time. Our goal is to treat your symptoms in order to aid your body in the healing process and to make you more comfortable.   Start prednisone 20 mg tablets. Take 2 tablets daily for 5 days.  You may take the cough suppressant every 12 hours as needed for cough and rest. Caution this medication contains codeine which may cause drowsiness.   Please notify me if you develop persistent fevers of 101, develop shortness of breath or increased shortness of breath, and/or feel worse after 1 week of onset of symptoms.   Increase consumption of water intake and rest.

## 2018-10-01 NOTE — Progress Notes (Signed)
Subjective:    Patient ID: Steven [name], male    DOB: 1981-06-30, 38 y.o.   MRN: [id number]  HPI  Steven [name] is a 38 year old male with a history of asthma, seasonal allergies, type 2 diabetes, hypertension who is being evaluated via phone today with a chief complaint of nasal congestion. Patient is at home. This call was conducted from our primary care medical office. Patient consented to treatment via phone.  Patient was called at 10:00 am and did not answer phone. Voicemail left for him to return my call.  Patient called back at 10:12 am. He also endorses chills, scratchy throat, body aches, low grade fevers of 99-100. 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 His symptoms began three days ago. He's tried Tylenol Sinus, Mucinex, Robitussin. He's also used his inhaler more recently for mild wheezing. He is compliant to his prescribed inhalers and Singulair. He is not using Flonase.   He denies exposure to Covid-19, recent travel, shortness of breath. His most bothersome symptoms is post nasal drip and nasal congestion. His last fever was 100.2 at 6 am today, rechecked one hour ago which was 99. He called out of work for today.  He endorses that his symptoms feel similar to prior sinus infections in the past.  Phone call lasted for 10 minutes and 12 seconds.   Review of Systems  Constitutional: Positive for chills and fever. Negative for fatigue.  HENT: Positive for congestion and sinus pressure. Negative for ear pain and sore throat.   Respiratory: Positive for cough and wheezing. Negative for shortness of breath.   Musculoskeletal: Positive for myalgias.  Allergic/Immunologic: Positive for environmental allergies.       Past Medical History:  Diagnosis Date  . Asthma   . Cancer ([place])   . Diabetes mellitus without complication ([place])   . 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 HTN (hypertension)   . Kaposi's sarcoma   . Syphilis  03/23/09-   1:2     Social History   Socioeconomic History  . Marital status: Single    Spouse name: Not on file  . Number of children: Not on file  . Years of education: Not on file  . Highest education level: Not on file  Occupational History  . Not on file  Social Needs  . Financial resource strain: Not on file  . Food insecurity:    Worry: Not on file    Inability: Not on file  . Transportation needs:    Medical: Not on file    Non-medical: Not on file  Tobacco Use  . Smoking status: Never Smoker  . Smokeless tobacco: Never Used  Substance and Sexual Activity  . Alcohol use: Yes    Alcohol/week: 0.0 standard drinks    Comment: socially - less now  . Drug use: No  . Sexual activity: Not Currently    Partners: Male    Birth control/protection: Condom    Comment: pt. declined condoms  Lifestyle  . Physical activity:    Days per week: Not on file    Minutes per session: Not on file  . Stress: Not on file  Relationships  . Social connections:    Talks on phone: Not on file    Gets together: Not on file    Attends religious service: Not on file    Active member of club or organization: Not on file    Attends meetings  of clubs or organizations: Not on file    Relationship status: Not on file  . Intimate partner violence:    Fear of current or ex partner: Not on file    Emotionally abused: Not on file    Physically abused: Not on file    Forced sexual activity: Not on file  Other Topics Concern  . Not on file  Social History Narrative   Single.    No children.   Works in Therapist, art   Enjoys DJ, traveling.     Past Surgical History:  Procedure Laterality Date  . BRAIN SURGERY    . IR GENERIC HISTORICAL  02/12/2016   IR REMOVAL TUN ACCESS W/ PORT W/O FL MOD SED 02/12/2016 [name], MD [facility]-INTERV RAD    Family History  Problem Relation Age of Onset  . Pancreatic cancer Mother   . Cancer Mother   . Diabetes Paternal Grandmother   . Thyroid disease  Paternal Grandmother     Allergies  Allergen Reactions  . Lisinopril Swelling    Swelling of lower lip while on lisinopril  . Penicillins Hives and Swelling    REACTION: rash and swelling  . Levaquin [Levofloxacin In D5w] Rash  . Tessalon [Benzonatate] Rash  . Aspirin Nausea Only    Current Outpatient Medications on File Prior to Visit  Medication Sig Dispense Refill  . albuterol (PROVENTIL HFA;VENTOLIN HFA) 108 (90 Base) MCG/ACT inhaler INHALE 2 PUFFS BY MOUTH EVERY 4 TO 6 HOURS AS NEEDED FOR COUGH OR WHEEZING 42.5 g 0  . amLODipine (NORVASC) 10 MG tablet Take 1 tablet (10 mg total) by mouth daily. For blood pressure. 90 tablet 3  . clotrimazole (MYCELEX) 10 MG troche Take 1 tablet (10 mg total) by mouth 5 (five) times daily. 35 tablet 0  . esomeprazole (NEXIUM) 40 MG capsule TAKE ONE CAPSULE BY MOUTH ONCE DAILY AS DIRECTED (Patient taking differently: Take 40 mg by mouth daily. ) 30 capsule 0  . fexofenadine (ALLEGRA) 180 MG tablet Take 1 tablet (180 mg total) by mouth daily. 15 tablet 0  . FLOVENT HFA 110 MCG/ACT inhaler INHALE 2 PUFFS INTO THE LUNGS TWICE DAILY 12 g 2  . fluticasone (FLONASE) 50 MCG/ACT nasal spray SHAKE LIQUID AND USE 2 SPRAYS IN EACH NOSTRIL DAILY 16 g 5  . fluticasone-salmeterol (ADVAIR HFA) 230-21 MCG/ACT inhaler USE 2 INHALATIONS BY MOUTH TWICE DAILY 12 g 5  . guaiFENesin (MUCINEX) 600 MG 12 hr tablet Take 1 tablet (600 mg total) by mouth 2 (two) times daily. 15 tablet 0  . hydrochlorothiazide (HYDRODIURIL) 25 MG tablet Take 1 tablet by mouth once daily for blood pressure. 90 tablet 3  . LANCETS MICRO THIN 33G MISC USE TO TEST BLOOD SUGAR ONCE A DAY 100 each 2  . levothyroxine (SYNTHROID, LEVOTHROID) 150 MCG tablet TAKE 1 TABLET BY MOUTH DAILY (Patient taking differently: Take 150 mcg by mouth daily before breakfast. Take 1 tablet by mouth daily.) 30 tablet 0  . metFORMIN (GLUCOPHAGE-XR) 500 MG 24 hr tablet Take 2 tablets in the morning and 1 tablet in the  evening for diabetes. 270 tablet 3  . montelukast (SINGULAIR) 10 MG tablet TAKE 1 TABLET(10 MG) BY MOUTH DAILY 90 tablet 0  . Multiple Vitamin (MULTIVITAMIN) tablet Take 1 tablet by mouth daily.    Marland Kitchen nystatin (MYCOSTATIN) 100000 UNIT/ML suspension 5 ml by mouth 4 times daily. swish and swallow or spit 110 mL 0  . PAZEO 0.7 % SOLN Place 1 drop into both  eyes daily. 1 Bottle 5  . potassium chloride SA (K-DUR,KLOR-CON) 20 MEQ tablet Take 1 tablet (20 mEq total) by mouth daily. 90 tablet 3  . TRIUMEQ 600-50-300 MG tablet TAKE 1 TABLET BY MOUTH DAILY 90 tablet 0  . TRUE METRIX BLOOD GLUCOSE TEST test strip USE AS DIRECTED FOR THREE TIMES DAILY TESTING OF BLOOD GLUCOSE 300 each 2   Current Facility-Administered Medications on File Prior to Visit  Medication Dose Route Frequency Provider Last Rate Last Dose  . omalizumab Arvid Right) injection 300 mg  300 mg Subcutaneous Q14 Days Kozlow, Donnamarie Poag, MD   300 mg at 09/20/18 1512    There were no vitals taken for this visit.   Objective:   Physical Exam  Constitutional: He is oriented to person, place, and time.  Respiratory: Effort normal.  Patient speaking in complete sentences, no respiratory distress noted via phone.  Dry cough during phone call.  Neurological: He is alert and oriented to person, place, and time.  Psychiatric: He has a normal mood and affect.           Assessment & Plan:  URI vs Asthma Exacerbation:  Symptoms for the last 3 days, low-grade fevers. Lower suspicion for Covid-19 given lack of travel, shortness of breath. Do suspect viral etiology, also mild asthma/allergy aggravation and will treat with supportive care. Prescription for prednisone burst and Tussionex sent to pharmacy. Continue all prescribed inhalers and Singulair. Strict emergency department precautions provided. He will update if symptoms persist. We discussed that he will need to remain home from work if running fevers.  Work note provided and faxed to fax  number provided by patient.  Pleas [name], NP'

## 2018-10-03 ENCOUNTER — Other Ambulatory Visit: Payer: Self-pay

## 2018-10-03 ENCOUNTER — Telehealth (INDEPENDENT_AMBULATORY_CARE_PROVIDER_SITE_OTHER): Payer: PPO | Admitting: Primary Care

## 2018-10-03 DIAGNOSIS — B370 Candidal stomatitis: Secondary | ICD-10-CM | POA: Diagnosis not present

## 2018-10-03 DIAGNOSIS — Z21 Asymptomatic human immunodeficiency virus [HIV] infection status: Secondary | ICD-10-CM | POA: Diagnosis not present

## 2018-10-03 DIAGNOSIS — R509 Fever, unspecified: Secondary | ICD-10-CM

## 2018-10-03 MED ORDER — CLOTRIMAZOLE 10 MG MT TROC: 10.0000 mg | Freq: Every day | OROMUCOSAL | 0 refills | Status: DC

## 2018-10-03 NOTE — Patient Instructions (Signed)
Continue taking your Advair every day as prescribed.  Shortness of Breath/Wheezing/Cough: Use the albuterol inhaler. Inhale 2 puffs into the lungs every 4 to 6 hours as needed for wheezing, cough, and/or shortness of breath.   You may take the cough suppressant every 12 hours as needed for cough and rest. Caution this medication contains codeine which may cause drowsiness.   Continue taking prednisone as prescribed.  I sent a refill for the clotrimazole troche medication for the oral thrush.   Please go to the hospital if you experience moderate to severe shortness of breath despite use of your inhalers and the prescribed medicine.  You must remain out of work until you have been afebrile for 3 days without the use of fever reducer medications like Tylenol or ibuprofen.  Please not hesitate to message me with any questions/concerns. It was nice to speak with you! [name], NP-C

## 2018-10-03 NOTE — Progress Notes (Signed)
   Capers Hagmann - 38 y.o. male  MRN [id number]  Date of Birth: [birth year]  PCP: Pleas [name], NP  This service was provided via telemedicine. Phone Visit performed on 10/03/2018    Rationale for phone visit reviewed. Patient consented to telephone encounter.    Location of patient: Home Location of provider: Office at [employer] [facility] Name of referring provider: N/A   Names of persons and role in encounter: Provider: Pleas [name], NP  Patient: Steven [name]  Other: N/A   Time on call: 6 minutes and 25 seconds   Subjective: CC: Cough HPI:  Mr. Knisley is a 38 year old male with a history of HIV, asthma, allergic rhinitis, type 2 diabetes, hypertension, hypothyroidism who presents today via phone with a chief complaint of cough.  He was last evaluated via phone visit on 10/01/2018 with a 3-day history of fever, cough, body aches.  At that time he was considered low risk for Covid-19 given lack of travel and shortness of breath, etiology was still suspected to be viral with asthma exacerbation.  He was encouraged to continue prescribed inhalers and Singulair.  He was treated with a prednisone burst and Tussionex cough syrup.  On the phone today he endorses that he continues to experience fevers. He woke up this morning with a temperature of 100.0, less frequent over the last several days. He continues to have some aches and cough, cough is productive with mostly clear sputum. He is compliant to his Advair daily, has noticed more oral thrush despite rinsing his mouth. He's using his albuterol some, taking Tylenol several times daily. He is taking Tussionex sometimes at night which has helped him get some rest. He is needing a work note to be out given continued fevers.    Objective/Observations:   No physical exam or vital signs collected unless specifically identified.  Respiratory status: speaks in complete sentences without evident shortness of breath.  No  cough or shortness of breath noted during exam.  Assessment/Plan:  Continued symptoms with overall improvement. Do suspect viral etiology, could be Covid-19 as this area is now considered pandemic.  During the phone interview he does not appear to be in distress, no cough or shortness of breath noted.  We will continue with his prescribed inhalers and prescribed Singulair and Allegra.  He will also continue prednisone that was prescribed 2 days ago.  We will provide him with a refill of his clotrimazole trouches for oral thrush.  We discussed that he will need to remain out of work until he is afebrile for 3 days without the use of antipyretics.  He verbalized understanding.  Strict emergency department precautions provided.  No problem-specific Assessment & Plan notes found for this encounter.   I discussed the assessment and treatment plan with the patient. The patient was provided an opportunity to ask questions and all were answered. The patient agreed with the plan and demonstrated an understanding of the instructions.  Lab Orders  No laboratory test(s) ordered today    No orders of the defined types were placed in this encounter.   The patient was advised to call back or seek an in-person evaluation if the symptoms worsen or if the condition fails to improve as anticipated.  Pleas [name], NP

## 2018-10-04 ENCOUNTER — Ambulatory Visit: Payer: PPO

## 2018-10-06 ENCOUNTER — Other Ambulatory Visit: Payer: Self-pay | Admitting: Endocrinology

## 2018-10-08 ENCOUNTER — Encounter: Payer: Self-pay | Admitting: Primary Care

## 2018-10-08 ENCOUNTER — Ambulatory Visit (INDEPENDENT_AMBULATORY_CARE_PROVIDER_SITE_OTHER): Payer: PPO | Admitting: Primary Care

## 2018-10-08 ENCOUNTER — Other Ambulatory Visit: Payer: Self-pay

## 2018-10-08 DIAGNOSIS — J069 Acute upper respiratory infection, unspecified: Secondary | ICD-10-CM | POA: Diagnosis not present

## 2018-10-08 DIAGNOSIS — B9789 Other viral agents as the cause of diseases classified elsewhere: Secondary | ICD-10-CM | POA: Diagnosis not present

## 2018-10-08 MED ORDER — AZITHROMYCIN 250 MG PO TABS: ORAL_TABLET | ORAL | 0 refills | Status: DC

## 2018-10-08 MED ORDER — HYDROCOD POLST-CPM POLST ER 10-8 MG/5ML PO SUER: 5.0000 mL | Freq: Two times a day (BID) | ORAL | 0 refills | Status: DC | PRN

## 2018-10-08 NOTE — Progress Notes (Signed)
Subjective:    Patient ID: Steven [name], male    DOB: [birth year], 38 y.o.   MRN: [id number]  HPI  Virtual Visit via Video Note  I connected with Steven [name] on 10/08/18 at  8:40 AM EDT by a video enabled telemedicine application and verified that I am speaking with the correct person using two identifiers. He is at home, I am in the office.   I discussed the limitations of evaluation and management by telemedicine and the availability of in person appointments. The patient expressed understanding and agreed to proceed.  History of Present Illness:  Steven [name] is a 38 year old male with a history of HIV, Type 2 diabetes, asthma, hypothyroidism, essential hypertension, GERD who presents today via video with a chief complaint of cough.  He was originally evaluated on 10/01/18 via phone with a three day history of viral URI symptoms including fevers, chills, body aches, etc. He was treated with supportive care including prednisone burst, Tussionex, antipyretics. He was evaluated again via phone on 10/03/18 for continued fevers, aches, cough. He endorsed compliance to his inhalers, also developed oral thrush. At this point we considered a diagnosis of Covid-19 given the widespread activity of the virus so we planned for him to quarantine and a work note was provided.  Since his last evaluation he continues with low grade fevers ranging 98.7-100.4. His last temperature was 100.3 this morning. He completed his course of prednisone with some improvement in wheezing and shortness of breath. He is compliant to his Advair daily and is using his albuterol inhaler every 4-6 hours due to shortness of breath. His cough is sometimes productive, mostly dry. Denies chest congestion. He's taking [phone] mg 2-3 times daily. Overall he's feeling about the same, has not been to work.   Observations/Objective:  Appears ill. Speaking in complete sentences, cough noted throughout visit. Alert and oriented.  No respiratory distress.  Assessment and Plan:  Very likely to still have viral involvement either from Covid-19 or influenza. Given complex medical history coupled with duration of symptoms it's very reasonable to treat with antibiotics for any potential complication of pneumonia. Rx for Azithromycin course sent to pharmacy. Also refilled Tussionex.  We discussed to continue Tylenol, not to exceed 3000 mg in 24 hours. Fluids, rest. Work note provided, discussed that he may not return to work until afebrile for 3 days without the use of antipyretics.   Follow Up Instructions:  Start Azithromycin antibiotics for infection. Take 2 tablets by mouth today, then 1 tablet daily for 4 additional days.  Continue Tylenol, do not exceed 3000 mg in 24 hours.  Make sure to stay hydrated and rest.  You may not return to work until you've been afebrile for 3 days without the use of Tylenol.  It was nice to see you! Steven Bossier, NP-C    I discussed the assessment and treatment plan with the patient. The patient was provided an opportunity to ask questions and all were answered. The patient agreed with the plan and demonstrated an understanding of the instructions.   The patient was advised to call back or seek an in-person evaluation if the symptoms worsen or if the condition fails to improve as anticipated.     Pleas [name], NP    Review of Systems  Constitutional: Positive for fatigue and fever.  HENT: Positive for postnasal drip and sneezing. Negative for congestion and sore throat.   Respiratory: Positive for cough and shortness of breath. Negative for wheezing.  Cardiovascular: Negative for chest pain.  Gastrointestinal: Negative for nausea and vomiting.  Allergic/Immunologic: Positive for environmental allergies.       Past Medical History:  Diagnosis Date  . Asthma   . Cancer ([place])   . Diabetes mellitus without complication ([place])   . HIV positive ([place]) 03/23/09    Genotype Y181C  . HTN (hypertension)   . Kaposi's sarcoma   . Syphilis 03/23/09-   1:2     Social History   Socioeconomic History  . Marital status: Single    Spouse name: Not on file  . Number of children: Not on file  . Years of education: Not on file  . Highest education level: Not on file  Occupational History  . Not on file  Social Needs  . Financial resource strain: Not on file  . Food insecurity:    Worry: Not on file    Inability: Not on file  . Transportation needs:    Medical: Not on file    Non-medical: Not on file  Tobacco Use  . Smoking status: Never Smoker  . Smokeless tobacco: Never Used  Substance and Sexual Activity  . Alcohol use: Yes    Alcohol/week: 0.0 standard drinks    Comment: socially - less now  . Drug use: No  . Sexual activity: Not Currently    Partners: Male    Birth control/protection: Condom    Comment: pt. declined condoms  Lifestyle  . Physical activity:    Days per week: Not on file    Minutes per session: Not on file  . Stress: Not on file  Relationships  . Social connections:    Talks on phone: Not on file    Gets together: Not on file    Attends religious service: Not on file    Active member of club or organization: Not on file    Attends meetings of clubs or organizations: Not on file    Relationship status: Not on file  . Intimate partner violence:    Fear of current or ex partner: Not on file    Emotionally abused: Not on file    Physically abused: Not on file    Forced sexual activity: Not on file  Other Topics Concern  . Not on file  Social History Narrative   Single.    No children.   Works in Therapist, art   Enjoys DJ, traveling.     Past Surgical History:  Procedure Laterality Date  . BRAIN SURGERY    . IR GENERIC HISTORICAL  02/12/2016   IR REMOVAL TUN ACCESS W/ PORT W/O FL MOD SED 02/12/2016 [name], MD [facility]-INTER[facility] RAD    Family History  Problem Relation Age of Onset  . Pancreatic cancer Mother   .  Cancer Mother   . Diabetes Paternal Grandmother   . Thyroid disease Paternal Grandmother     Allergies  Allergen Reactions  . Lisinopril Swelling    Swelling of lower lip while on lisinopril  . Penicillins Hives and Swelling    REACTION: rash and swelling  . Levaquin [Levofloxacin In D5w] Rash  . Tessalon [Benzonatate] Rash  . Aspirin Nausea Only    Current Outpatient Medications on File Prior to Visit  Medication Sig Dispense Refill  . albuterol (PROVENTIL HFA;VENTOLIN HFA) 108 (90 Base) MCG/ACT inhaler INHALE 2 PUFFS BY MOUTH EVERY 4 TO 6 HOURS AS NEEDED FOR COUGH OR WHEEZING 42.5 g 0  . amLODipine (NORVASC) 10 MG tablet Take 1 tablet (10  mg total) by mouth daily. For blood pressure. 90 tablet 3  . clotrimazole (MYCELEX) 10 MG troche Take 1 tablet (10 mg total) by mouth 5 (five) times daily. 35 tablet 0  . esomeprazole (NEXIUM) 40 MG capsule TAKE ONE CAPSULE BY MOUTH ONCE DAILY AS DIRECTED (Patient taking differently: Take 40 mg by mouth daily. ) 30 capsule 0  . fexofenadine (ALLEGRA) 180 MG tablet Take 1 tablet (180 mg total) by mouth daily. 15 tablet 0  . FLOVENT HFA 110 MCG/ACT inhaler INHALE 2 PUFFS INTO THE LUNGS TWICE DAILY 12 g 2  . fluticasone (FLONASE) 50 MCG/ACT nasal spray SHAKE LIQUID AND USE 2 SPRAYS IN EACH NOSTRIL DAILY 16 g 5  . fluticasone-salmeterol (ADVAIR HFA) 230-21 MCG/ACT inhaler USE 2 INHALATIONS BY MOUTH TWICE DAILY 12 g 5  . guaiFENesin (MUCINEX) 600 MG 12 hr tablet Take 1 tablet (600 mg total) by mouth 2 (two) times daily. 15 tablet 0  . hydrochlorothiazide (HYDRODIURIL) 25 MG tablet Take 1 tablet by mouth once daily for blood pressure. 90 tablet 3  . LANCETS MICRO THIN 33G MISC USE TO TEST BLOOD SUGAR ONCE A DAY 100 each 2  . levothyroxine (SYNTHROID, LEVOTHROID) 150 MCG tablet TAKE 1 TABLET BY MOUTH DAILY with EXTRA 1/2 TABLET ONCE WEEKLY 96 tablet 0  . metFORMIN (GLUCOPHAGE-XR) 500 MG 24 hr tablet Take 2 tablets in the morning and 1 tablet in the evening  for diabetes. 270 tablet 3  . montelukast (SINGULAIR) 10 MG tablet TAKE 1 TABLET(10 MG) BY MOUTH DAILY 90 tablet 0  . Multiple Vitamin (MULTIVITAMIN) tablet Take 1 tablet by mouth daily.    Marland Kitchen nystatin (MYCOSTATIN) 100000 UNIT/ML suspension 5 ml by mouth 4 times daily. swish and swallow or spit 110 mL 0  . PAZEO 0.7 % SOLN Place 1 drop into both eyes daily. 1 Bottle 5  . potassium chloride SA (K-DUR,KLOR-CON) 20 MEQ tablet Take 1 tablet (20 mEq total) by mouth daily. 90 tablet 3  . TRIUMEQ 600-50-300 MG tablet TAKE 1 TABLET BY MOUTH DAILY 90 tablet 0  . TRUE METRIX BLOOD GLUCOSE TEST test strip USE AS DIRECTED FOR THREE TIMES DAILY TESTING OF BLOOD GLUCOSE 300 each 2   Current Facility-Administered Medications on File Prior to Visit  Medication Dose Route Frequency Provider Last Rate Last Dose  . omalizumab Arvid Right) injection 300 mg  300 mg Subcutaneous Q14 Days Kozlow, Donnamarie Poag, MD   300 mg at 09/20/18 1512    There were no vitals taken for this visit.   Objective:   Physical Exam  Constitutional: He is oriented to person, place, and time. He appears well-nourished. He appears ill.  Respiratory: Effort normal. No respiratory distress.  Neurological: He is alert and oriented to person, place, and time.  Psychiatric: He has a normal mood and affect.           Assessment & Plan:

## 2018-10-08 NOTE — Patient Instructions (Signed)
Start Azithromycin antibiotics for infection. Take 2 tablets by mouth today, then 1 tablet daily for 4 additional days.  Continue Tylenol, do not exceed 3000 mg in 24 hours.  Make sure to stay hydrated and rest.  You may not return to work until you've been afebrile for 3 days without the use of Tylenol.  It was nice to see you! [name], NP-C

## 2018-10-08 NOTE — Assessment & Plan Note (Signed)
Very likely to still have viral involvement either from Covid-19 or influenza. Given complex medical history coupled with duration of symptoms it's very reasonable to treat with antibiotics for any potential complication of pneumonia. Rx for Azithromycin course sent to pharmacy. Also refilled Tussionex.  We discussed to continue Tylenol, not to exceed 3000 mg in 24 hours. Fluids, rest. Work note provided, discussed that he may not return to work until afebrile for 3 days without the use of antipyretics.

## 2018-10-12 ENCOUNTER — Telehealth: Payer: Self-pay

## 2018-10-12 ENCOUNTER — Other Ambulatory Visit: Payer: Self-pay | Admitting: Primary Care

## 2018-10-12 NOTE — Telephone Encounter (Signed)
Noted and agree. 

## 2018-10-12 NOTE — Telephone Encounter (Addendum)
Pt last had virtual visit on 10/08/18. On 10/12/18 pts roommate's father has tested positive for covid 51. pts roommate does not have symptoms and when pts roommate was at his parents home he wore a mask and gloves.pt feels a little better today but still  prod cough with clear to yellow phlegm, and sometimes non prod cough, no fever but does have SOB after a coughing episode. Pt has been using inhalers and brathing treatments sometimes more than prescribed. 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 Gentry Fitz NP said at this time we are not testing for covid 19 and pt would need to contact employer and let them be aware of quarantine information and the new development with pts roommate and family situation. Pt voiced understanding and if pt condition were to worsen pt will call [facility] and pt will contact employer. FYI to Gentry Fitz NP.

## 2018-10-18 ENCOUNTER — Ambulatory Visit: Payer: PPO | Admitting: Podiatry

## 2018-10-23 DIAGNOSIS — I890 Lymphedema, not elsewhere classified: Secondary | ICD-10-CM | POA: Diagnosis not present

## 2018-11-06 ENCOUNTER — Other Ambulatory Visit: Payer: Self-pay | Admitting: Allergy

## 2018-11-21 ENCOUNTER — Other Ambulatory Visit: Payer: Self-pay

## 2018-11-21 ENCOUNTER — Other Ambulatory Visit (INDEPENDENT_AMBULATORY_CARE_PROVIDER_SITE_OTHER): Payer: PPO

## 2018-11-21 DIAGNOSIS — E890 Postprocedural hypothyroidism: Secondary | ICD-10-CM | POA: Diagnosis not present

## 2018-11-22 ENCOUNTER — Encounter: Payer: Self-pay | Admitting: Allergy

## 2018-11-22 ENCOUNTER — Other Ambulatory Visit: Payer: Self-pay

## 2018-11-22 ENCOUNTER — Ambulatory Visit (INDEPENDENT_AMBULATORY_CARE_PROVIDER_SITE_OTHER): Payer: PPO | Admitting: Allergy

## 2018-11-22 DIAGNOSIS — T7840XD Allergy, unspecified, subsequent encounter: Secondary | ICD-10-CM

## 2018-11-22 DIAGNOSIS — K219 Gastro-esophageal reflux disease without esophagitis: Secondary | ICD-10-CM

## 2018-11-22 DIAGNOSIS — H1010 Acute atopic conjunctivitis, unspecified eye: Secondary | ICD-10-CM

## 2018-11-22 DIAGNOSIS — Z91018 Allergy to other foods: Secondary | ICD-10-CM

## 2018-11-22 DIAGNOSIS — J4550 Severe persistent asthma, uncomplicated: Secondary | ICD-10-CM

## 2018-11-22 DIAGNOSIS — J302 Other seasonal allergic rhinitis: Secondary | ICD-10-CM

## 2018-11-22 DIAGNOSIS — J3089 Other allergic rhinitis: Secondary | ICD-10-CM

## 2018-11-22 LAB — TSH: TSH: 1.86 u[IU]/mL (ref 0.35–4.50)

## 2018-11-22 LAB — T4, FREE: Free T4: 1.33 ng/dL (ref 0.60–1.60)

## 2018-11-22 MED ORDER — MONTELUKAST SODIUM 10 MG PO TABS: ORAL_TABLET | ORAL | 1 refills | Status: DC

## 2018-11-22 NOTE — Assessment & Plan Note (Signed)
Past history - 3 allergic reactions since the last visit. One episode most likely due to shellfish contact but other 2 episodes no triggers noted. Normal tryptase. Interim history - No additional reactions.   For mild symptoms you can take over the counter antihistamines such as Benadryl and monitor symptoms closely. If symptoms worsen or if you have severe symptoms including breathing issues, throat closure, significant swelling, whole body hives, severe diarrhea and vomiting, lightheadedness then inject epinephrine and seek immediate medical care afterwards.

## 2018-11-22 NOTE — Assessment & Plan Note (Signed)
Well-controlled with Nexium 40mg  daily.   Continue medication.   Continue lifestyle modification diet.

## 2018-11-22 NOTE — Progress Notes (Signed)
Start 4:15 pm Location outside of office due to exposure to COVID-19 END: 4:30 pm

## 2018-11-22 NOTE — Patient Instructions (Addendum)
Asthma, severe persistent, well-controlled  Daily controller medication(s):continue Advair 230 2 puffs twice a day with spacer and rinse mouth afterwards  Continue Xolair 300mg  every 14 days   Prior to physical activity:May use albuterol rescue inhaler 2 puffs 5 to 15 minutes prior to strenuous physical activities.  Rescue medications:May use albuterol rescue inhaler 2 puffs or nebulizer every 4 to 6 hours as needed for shortness of breath, chest tightness, coughing, and wheezing. Monitor frequency of use.   During upper respiratory infections: Start Qvar 80 2 puffs twice a day for 1-2 weeks. Asthma control goals:  Full participation in all desired activities (may need albuterol before activity) Albuterol use two times or less a week on average (not counting use with activity) Cough interfering with sleep two times or less a month Oral steroids no more than once a year No hospitalizations  Seasonal and perennial allergic rhinoconjunctivitis Past history - 2015 skin testing was positive to grass, ragweed, weed, mold, dust mites, cat and dog.  Take Flonase 1-2 sprays daily as needed for nasal congestion.   May use over the counter antihistamines such as Zyrtec (cetirizine), Claritin (loratadine), Allegra (fexofenadine), or Xyzal (levocetirizine) daily as needed.  May usepazeoeye drops daily as needed for itchy eyes.   Continue environmental control measures.   Food allergy  Continue to avoid shellfish and mushroom.  For mild symptoms you can take over the counter antihistamines such as Benadryl and monitor symptoms closely. If symptoms worsen or if you have severe symptoms including breathing issues, throat closure, significant swelling, whole body hives, severe diarrhea and vomiting, lightheadedness then inject epinephrine and seek immediate medical care afterwards.  Allergic reaction Past history - 3 allergic reactions since the last visit. One episode most likely due to  shellfish contact but other 2 episodes no triggers noted. Interim history - normal tryptase, no additional reactions. No issues with Xolair injections.   He most likely had cross-contamination with shellfish.   Monitor symptoms.   For mild symptoms you can take over the counter antihistamines such as Benadryl and monitor symptoms closely. If symptoms worsen or if you have severe symptoms including breathing issues, throat closure, significant swelling, whole body hives, severe diarrhea and vomiting, lightheadedness then inject epinephrine and seek immediate medical care afterwards.  Gastroesophageal reflux disease  Well-controlled with Nexium 40mg  daily. Continue medication.   Follow up in 3 months. Reducing Pollen Exposure . Pollen seasons: trees (spring), grass (summer) and ragweed/weeds (fall). Marland Kitchen Keep windows closed in your home and car to lower pollen exposure.  Steven [name] air conditioning in the bedroom and throughout the house if possible.  . Avoid going out in dry windy days - especially early morning. . Pollen counts are highest between 5 - 10 AM and on dry, hot and windy days.  . Save outside activities for late afternoon or after a heavy rain, when pollen levels are lower.  . Avoid mowing of grass if you have grass pollen allergy. Marland Kitchen Be aware that pollen can also be transported indoors on people and pets.  . Dry your clothes in an automatic dryer rather than hanging them outside where they might collect pollen.  . Rinse hair and eyes before bedtime. Control of House Dust Mite Allergen . Dust mite allergens are a common trigger of allergy and asthma symptoms. While they can be found throughout the house, these microscopic creatures thrive in warm, humid environments such as bedding, upholstered furniture and carpeting. . Because so much time is spent in the  bedroom, it is essential to reduce mite levels there.  . Encase pillows, mattresses, and box springs in special  allergen-proof fabric covers or airtight, zippered plastic covers.  . Bedding should be washed weekly in hot water (130 F) and dried in a hot dryer. Allergen-proof covers are available for comforters and pillows that can't be regularly washed.  Wendee Copp the allergy-proof covers every few months. Minimize clutter in the bedroom. Keep pets out of the bedroom.  Marland Kitchen Keep humidity less than 50% by using a dehumidifier or air conditioning. You can buy a humidity measuring device called a hygrometer to monitor this.  . If possible, replace carpets with hardwood, linoleum, or washable area rugs. If that's not possible, vacuum frequently with a vacuum that has a HEPA filter. . Remove all upholstered furniture and non-washable window drapes from the bedroom. . Remove all non-washable stuffed toys from the bedroom.  Wash stuffed toys weekly. Mold Control . Mold and fungi can grow on a variety of surfaces provided certain temperature and moisture conditions exist.  . Outdoor molds grow on plants, decaying vegetation and soil. The major outdoor mold, Alternaria and Cladosporium, are found in very high numbers during hot and dry conditions. Generally, a late summer - fall peak is seen for common outdoor fungal spores. Rain will temporarily lower outdoor mold spore count, but counts rise rapidly when the rainy period ends. . The most important indoor molds are Aspergillus and Penicillium. Dark, humid and poorly ventilated basements are ideal sites for mold growth. The next most common sites of mold growth are the bathroom and the kitchen. Outdoor (Seasonal) Mold Control . Use air conditioning and keep windows closed. . Avoid exposure to decaying vegetation. Marland Kitchen Avoid leaf raking. . Avoid grain handling. . Consider wearing a face mask if working in moldy areas.  Indoor (Perennial) Mold Control  . Maintain humidity below 50%. . Get rid of mold growth on hard surfaces with water, detergent and, if necessary, 5% bleach  (do not mix with other cleaners). Then dry the area completely. If mold covers an area more than 10 square feet, consider hiring an indoor environmental professional. . For clothing, washing with soap and water is best. If moldy items cannot be cleaned and dried, throw them away. . Remove sources e.g. contaminated carpets. . Repair and seal leaking roofs or pipes. Using dehumidifiers in damp basements may be helpful, but empty the water and clean units regularly to prevent mildew from forming. All rooms, especially basements, bathrooms and kitchens, require ventilation and cleaning to deter mold and mildew growth. Avoid carpeting on concrete or damp floors, and storing items in damp areas. Pet Allergen Avoidance: . Contrary to popular opinion, there are no "hypoallergenic" breeds of dogs or cats. That is because people are not allergic to an animal's hair, but to an allergen found in the animal's saliva, dander (dead skin flakes) or urine. Pet allergy symptoms typically occur within minutes. For some people, symptoms can build up and become most severe 8 to 12 hours after contact with the animal. People with severe allergies can experience reactions in public places if dander has been transported on the pet owners' clothing. Marland Kitchen Keeping an animal outdoors is only a partial solution, since homes with pets in the yard still have higher concentrations of animal allergens. . Before getting a pet, ask your allergist to determine if you are allergic to animals. If your pet is already considered part of your family, try to minimize contact and keep  the pet out of the bedroom and other rooms where you spend a great deal of time. . As with dust mites, vacuum carpets often or replace carpet with a hardwood floor, tile or linoleum. . High-efficiency particulate air (HEPA) cleaners can reduce allergen levels over time. . While dander and saliva are the source of cat and dog allergens, urine is the source of allergens  from rabbits, hamsters, mice and [place] pigs; so ask a non-allergic family member to clean the animal's cage. . If you have a pet allergy, talk to your allergist about the potential for allergy immunotherapy (allergy shots). This strategy can often provide long-term relief.

## 2018-11-22 NOTE — Assessment & Plan Note (Signed)
Past history - 2015 skin testing was positive to grass, ragweed, weed, mold, dust mites, cat and dog. Interim history - stable with below regimen.  Take Flonase 1-2 sprays daily as needed for nasal congestion.   May use over the counter antihistamines such as Zyrtec (cetirizine), Claritin (loratadine), Allegra (fexofenadine), or Xyzal (levocetirizine) daily as needed.  May usepazeoeye drops daily as needed for itchy eyes.   Continue environmental control measures.

## 2018-11-22 NOTE — Assessment & Plan Note (Signed)
Well-controlled with below regimen. Using albuterol about once a week. Missed a few Xolair injections.  Daily controller medication(s):continue Advair 230 2 puffs twice a day with spacer and rinse mouth afterwards  Continue Xolair 300mg  every 14 days   Prior to physical activity:May use albuterol rescue inhaler 2 puffs 5 to 15 minutes prior to strenuous physical activities.  Rescue medications:May use albuterol rescue inhaler 2 puffs or nebulizer every 4 to 6 hours as needed for shortness of breath, chest tightness, coughing, and wheezing. Monitor frequency of use.   During upper respiratory infections: Start Qvar 80 2 puffs twice a day for 1-2 weeks.

## 2018-11-22 NOTE — Assessment & Plan Note (Signed)
Past history -  2019 immunocap was slightly positive: alpha gal, wheat, corn, peanut, soy, egg, shellfish. Patient tolerates all of the above foods with no issues except for the shellfish.  Interim history - No accidental ingestion.   Continue to avoid shellfish and mushroom.  Not necessary to avoid any other foods. Bloodwork showed irrelevant sensitizations.   For mild symptoms you can take over the counter antihistamines such as Benadryl and monitor symptoms closely. If symptoms worsen or if you have severe symptoms including breathing issues, throat closure, significant swelling, whole body hives, severe diarrhea and vomiting, lightheadedness then inject epinephrine and seek immediate medical care afterwards.

## 2018-11-22 NOTE — Progress Notes (Signed)
RE: Steven [name] MRN: [id number] DOB: 09-May-1981 Date of Telemedicine Visit: 11/22/2018  Referring provider: Pleas [name], [name] Primary care provider: Pleas [name], [name]  Chief Complaint: Asthma   Telemedicine Follow Up Visit via Telephone: I connected with Steven [name] for a follow up on 11/22/18 by telephone and verified that I am speaking with the correct person using two identifiers.   I discussed the limitations, risks, security and privacy concerns of performing an evaluation and management service by telephone and the availability of in person appointments. I also discussed with the patient that there may be a patient responsible charge related to this service. The patient expressed understanding and agreed to proceed.  Patient is at his car. Provider is at the office.  Visit start time: 3:59PM Visit end time: 4:25PM Insurance consent/check in by: Steven [name] Medical consent and medical assistant/nurse: Steven [name].  History of Present Illness: He is a 38 y.o. male, who is being followed for asthma, food allergy, allergic rhino conjunctivitis, allergic reactions, GERD. His previous allergy office visit was on 213/2020 with Steven [name]. Today is a regular follow up visit.  Patient was supposed to be seen in the OV during his Xolair injections but during screening questionnaire patient states that his neighbor was tested for COVID-19 and passed away early last month. He himself is asymptomatic. Denies any fevers/chills or any other symptoms. We did convert to a telephone visit to minimize contact and he received his Xolair injection today.   Asthma, severe persistent, well-controlled Using albuterol once a week due to pollen exposure which causes some labored breathing or wheezing. Otherwise denies any SOB, coughing, wheezing, chest tightness, nocturnal awakenings, ER/urgent care visits or prednisone use since the last visit.  No Qvar use since the last visit.  Currently  on Advair 230 2 puffs BID and missed a few Xolair injections.   Seasonal and perennial allergic rhinoconjunctivitis Sneezing and rhinorrhea.  Using Flonase as needed and allegra daily. Using eye drops as needed.  Taking singulair daily. Happy with this regimen.   Food allergy Avoiding shellfish and mushroom.  Allergic reaction No allergic reactions since the last visit.   Gastroesophageal reflux disease Well-controlled with Nexium 40mg  daily.  Assessment and Plan: Steven [name] is a 38 y.o. male with: Asthma, severe persistent, well-controlled Well-controlled with below regimen. Using albuterol about once a week. Missed a few Xolair injections.  Daily controller medication(s):continue Advair 230 2 puffs twice a day with spacer and rinse mouth afterwards  Continue Xolair 300mg  every 14 days   Prior to physical activity:May use albuterol rescue inhaler 2 puffs 5 to 15 minutes prior to strenuous physical activities.  Rescue medications:May use albuterol rescue inhaler 2 puffs or nebulizer every 4 to 6 hours as needed for shortness of breath, chest tightness, coughing, and wheezing. Monitor frequency of use.   During upper respiratory infections: Start Qvar 80 2 puffs twice a day for 1-2 weeks.  Seasonal and perennial allergic rhinoconjunctivitis Past history - 2015 skin testing was positive to grass, ragweed, weed, mold, dust mites, cat and dog. Interim history - stable with below regimen.  Take Flonase 1-2 sprays daily as needed for nasal congestion.   May use over the counter antihistamines such as Zyrtec (cetirizine), Claritin (loratadine), Allegra (fexofenadine), or Xyzal (levocetirizine) daily as needed.  May usepazeoeye drops daily as needed for itchy eyes.   Continue environmental control measures.   Food allergy Past history -  2019 immunocap was slightly positive: alpha gal, wheat, corn, peanut,  soy, egg, shellfish. Patient tolerates all of the above foods with no  issues except for the shellfish.  Interim history - No accidental ingestion.   Continue to avoid shellfish and mushroom.  Not necessary to avoid any other foods. Bloodwork showed irrelevant sensitizations.   For mild symptoms you can take over the counter antihistamines such as Benadryl and monitor symptoms closely. If symptoms worsen or if you have severe symptoms including breathing issues, throat closure, significant swelling, whole body hives, severe diarrhea and vomiting, lightheadedness then inject epinephrine and seek immediate medical care afterwards.  Allergic reaction Past history - 3 allergic reactions since the last visit. One episode most likely due to shellfish contact but other 2 episodes no triggers noted. Normal tryptase. Interim history - No additional reactions.   For mild symptoms you can take over the counter antihistamines such as Benadryl and monitor symptoms closely. If symptoms worsen or if you have severe symptoms including breathing issues, throat closure, significant swelling, whole body hives, severe diarrhea and vomiting, lightheadedness then inject epinephrine and seek immediate medical care afterwards.  Gastroesophageal reflux disease Well-controlled with Nexium 40mg  daily.   Continue medication.   Continue lifestyle modification diet.   Return in about 3 months (around 02/22/2019).  Meds ordered this encounter  Medications  . montelukast (SINGULAIR) 10 MG tablet    Sig: TAKE 1 TABLET(10 MG) BY MOUTH DAILY    Dispense:  90 tablet    Refill:  1    **Patient requests 90 days supply**   Diagnostics: None.  Medication List:  Current Outpatient Medications  Medication Sig Dispense Refill  . albuterol (PROVENTIL HFA;VENTOLIN HFA) 108 (90 Base) MCG/ACT inhaler INHALE 2 PUFFS BY MOUTH EVERY 4 TO 6 HOURS AS NEEDED FOR COUGH OR WHEEZING 42.5 g 0  . amLODipine (NORVASC) 10 MG tablet Take 1 tablet (10 mg total) by mouth daily. For blood pressure. 90 tablet 3   . azithromycin (ZITHROMAX) 250 MG tablet Take 2 tablets by mouth today, then 1 tablet daily for 4 additional days. 6 tablet 0  . chlorpheniramine-HYDROcodone (TUSSIONEX PENNKINETIC ER) 10-8 MG/5ML SUER Take 5 mLs by mouth every 12 (twelve) hours as needed for cough. 50 mL 0  . clotrimazole (MYCELEX) 10 MG troche Take 1 tablet (10 mg total) by mouth 5 (five) times daily. 35 tablet 0  . esomeprazole (NEXIUM) 40 MG capsule TAKE ONE CAPSULE BY MOUTH ONCE DAILY AS DIRECTED (Patient taking differently: Take 40 mg by mouth daily. ) 30 capsule 0  . fexofenadine (ALLEGRA) 180 MG tablet Take 1 tablet (180 mg total) by mouth daily. 15 tablet 0  . FLOVENT HFA 110 MCG/ACT inhaler INHALE 2 PUFFS INTO THE LUNGS TWICE DAILY 12 g 2  . fluticasone (FLONASE) 50 MCG/ACT nasal spray SHAKE LIQUID AND USE 2 SPRAYS IN EACH NOSTRIL DAILY 16 g 5  . fluticasone-salmeterol (ADVAIR HFA) 230-21 MCG/ACT inhaler USE 2 INHALATIONS BY MOUTH TWICE DAILY 12 g 5  . guaiFENesin (MUCINEX) 600 MG 12 hr tablet Take 1 tablet (600 mg total) by mouth 2 (two) times daily. 15 tablet 0  . hydrochlorothiazide (HYDRODIURIL) 25 MG tablet Take 1 tablet by mouth once daily for blood pressure. 90 tablet 3  . LANCETS MICRO THIN 33G MISC USE TO TEST BLOOD SUGAR ONCE A DAY 100 each 2  . levothyroxine (SYNTHROID, LEVOTHROID) 150 MCG tablet TAKE 1 TABLET BY MOUTH DAILY with EXTRA 1/2 TABLET ONCE WEEKLY 96 tablet 0  . metFORMIN (GLUCOPHAGE-XR) 500 MG 24 hr tablet Take 2  tablets in the morning and 1 tablet in the evening for diabetes. 270 tablet 3  . montelukast (SINGULAIR) 10 MG tablet TAKE 1 TABLET(10 MG) BY MOUTH DAILY 90 tablet 1  . Multiple Vitamin (MULTIVITAMIN) tablet Take 1 tablet by mouth daily.    Marland Kitchen nystatin (MYCOSTATIN) 100000 UNIT/ML suspension 5 ml by mouth 4 times daily. swish and swallow or spit 110 mL 0  . ONE TOUCH ULTRA TEST test strip TEST BLOOD GLUCOSE THREE TIMES DAILY AS DIRECTED 100 each 2  . PAZEO 0.7 % SOLN Place 1 drop into both  eyes daily. 1 Bottle 5  . potassium chloride SA (K-DUR,KLOR-CON) 20 MEQ tablet Take 1 tablet (20 mEq total) by mouth daily. 90 tablet 3  . TRIUMEQ 952-84-132 MG tablet TAKE 1 TABLET BY MOUTH DAILY 90 tablet 0   Current Facility-Administered Medications  Medication Dose Route Frequency Provider Last Rate Last Dose  . omalizumab Arvid Right) injection 300 mg  300 mg Subcutaneous Q14 Days Jiles Prows, MD   300 mg at 11/22/18 1603   Allergies: Allergies  Allergen Reactions  . Lisinopril Swelling    Swelling of lower lip while on lisinopril  . Penicillins Hives and Swelling    REACTION: rash and swelling  . Levaquin [Levofloxacin In D5w] Rash  . Tessalon [Benzonatate] Rash  . Aspirin Nausea Only   I reviewed his past medical history, social history, family history, and environmental history and no significant changes have been reported from previous visit on 08/23/2018.  Review of Systems  Constitutional: Negative for appetite change, chills, fever and unexpected weight change.  HENT: Negative for congestion and rhinorrhea.   Eyes: Negative for itching.  Respiratory: Negative for cough, chest tightness, shortness of breath and wheezing.   Gastrointestinal: Negative for abdominal pain.  Skin: Negative for rash.  Allergic/Immunologic: Positive for environmental allergies and food allergies.  Neurological: Negative for headaches.   Objective: Physical Exam Not obtained as encounter was done via telephone.   Previous notes and tests were reviewed.  I discussed the assessment and treatment plan with the patient. The patient was provided an opportunity to ask questions and all were answered. The patient agreed with the plan and demonstrated an understanding of the instructions. After visit summary/patient instructions available via mychart.   The patient was advised to call back or seek an in-person evaluation if the symptoms worsen or if the condition fails to improve as anticipated.  I  provided 26 minutes of non-face-to-face time during this encounter.  It was my pleasure to participate in [facility] care today. Please feel free to contact me with any questions or concerns.   Sincerely,  Rexene Alberts, DO Allergy & Immunology  [facility] office: [phone] [facility] office: [phone]

## 2018-11-25 ENCOUNTER — Other Ambulatory Visit: Payer: Self-pay | Admitting: Infectious Diseases

## 2018-11-25 DIAGNOSIS — B20 Human immunodeficiency virus [HIV] disease: Secondary | ICD-10-CM

## 2018-11-26 ENCOUNTER — Other Ambulatory Visit: Payer: Self-pay | Admitting: *Deleted

## 2018-11-26 DIAGNOSIS — B20 Human immunodeficiency virus [HIV] disease: Secondary | ICD-10-CM

## 2018-11-26 MED ORDER — ABACAVIR-DOLUTEGRAVIR-LAMIVUD 600-50-300 MG PO TABS: 1.0000 | ORAL_TABLET | Freq: Every day | ORAL | 1 refills | Status: DC

## 2018-11-28 ENCOUNTER — Ambulatory Visit (INDEPENDENT_AMBULATORY_CARE_PROVIDER_SITE_OTHER): Payer: PPO | Admitting: Endocrinology

## 2018-11-28 ENCOUNTER — Encounter: Payer: Self-pay | Admitting: Endocrinology

## 2018-11-28 ENCOUNTER — Other Ambulatory Visit: Payer: Self-pay

## 2018-11-28 DIAGNOSIS — E890 Postprocedural hypothyroidism: Secondary | ICD-10-CM | POA: Diagnosis not present

## 2018-11-28 NOTE — Progress Notes (Signed)
Patient ID: Steven [name], male   DOB: 1981-04-07, 38 y.o.   MRN: [id number]   Today's office visit was provided via telemedicine using video technique Explained to the patient and the the limitations of evaluation and management by telemedicine and the availability of in person appointments.  The patient understood the limitations and agreed to proceed. Patient also understood that the telehealth visit is billable. . Location of the patient: Home . Location of the provider: Office Only the patient and myself were participating in the encounter    Reason for Appointment:  Hypothyroidism, follow-up visit    History of Present Illness:   HYPOTHYROIDISM  was first diagnosed in ? Early 2013 He previously had hyperthyroidism which was treated with radioactive iodine in 05/2011  He had been taking Synthroid 200 mcg daily initially and it was then reduced to 175 mcg Because of persistently low TSH the dose was again reduced in 08/2014 down to 150 g  He has been since 5/18 taking 7-1/2 tablets a week of the 150 mcg levothyroxine His TSH at that time was 4.6  He does not complain of any unusual fatigue or lethargy No weight gain  Also no recent problems with his eyes, previously had some ophthalmopathy changes  He takes his medication consistently daily before breakfast, usually an hour before eating   TSH is again normal  Wt Readings from Last 3 Encounters:  08/10/18 283 lb (128.4 kg)  06/14/18 280 lb 4.8 oz (127.1 kg)  04/06/18 189 lb (85.7 kg)    Labs:  Lab Results  Component Value Date   TSH 1.86 11/21/2018   TSH 2.20 11/24/2017   TSH 4.62 (H) 11/29/2016   FREET4 1.33 11/21/2018   FREET4 1.20 11/24/2017   FREET4 1.25 11/29/2016    OTHER active problems including diabetes are discussed in review of systems:    Past Medical History:  Diagnosis Date  . Asthma   . Cancer ([place])   . Diabetes mellitus without complication ([place])   . 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 HTN (hypertension)   . Kaposi's sarcoma   . Syphilis 03/23/09-   1:2    Past Surgical History:  Procedure Laterality Date  . BRAIN SURGERY    . IR GENERIC HISTORICAL  02/12/2016   IR REMOVAL TUN ACCESS W/ PORT W/O FL MOD SED 02/12/2016 [name], MD [facility]-INTERV RAD    Family History  Problem Relation Age of Onset  . Pancreatic cancer Mother   . Cancer Mother   . Diabetes Paternal Grandmother   . Thyroid disease Paternal Grandmother     Social History:  reports that he has never smoked. He has never used smokeless tobacco. He reports current alcohol use. He reports that he does not use drugs.  Allergies:  Allergies  Allergen Reactions  . Lisinopril Swelling    Swelling of lower lip while on lisinopril  . Penicillins Hives and Swelling    REACTION: rash and swelling  . Levaquin [Levofloxacin In D5w] Rash  . Tessalon [Benzonatate] Rash  . Aspirin Nausea Only    Allergies as of 11/28/2018      Reactions   Lisinopril Swelling   Swelling of lower lip while on lisinopril   Penicillins Hives, Swelling   REACTION: rash and swelling   Levaquin [levofloxacin In D5w] Rash   Tessalon [benzonatate] Rash   Aspirin Nausea Only      Medication List       Accurate as of  Nov 28, 2018  3:26 PM. If you have any questions, ask your nurse or doctor.        STOP taking these medications   azithromycin 250 MG tablet Commonly known as:  ZITHROMAX Stopped by:  Elayne Snare, MD   chlorpheniramine-HYDROcodone 10-8 MG/5ML Suer Commonly known as:  Tussionex Pennkinetic ER Stopped by:  Elayne Snare, MD     TAKE these medications   abacavir-dolutegravir-lamiVUDine 600-50-300 MG tablet Commonly known as:  Triumeq Take 1 tablet by mouth daily.   albuterol 108 (90 Base) MCG/ACT inhaler Commonly known as:  VENTOLIN HFA INHALE 2 PUFFS BY MOUTH EVERY 4 TO 6 HOURS AS NEEDED FOR COUGH OR WHEEZING   amLODipine 10 MG tablet Commonly known as:  NORVASC Take 1 tablet  (10 mg total) by mouth daily. For blood pressure.   clotrimazole 10 MG troche Commonly known as:  MYCELEX Take 1 tablet (10 mg total) by mouth 5 (five) times daily.   esomeprazole 40 MG capsule Commonly known as:  NEXIUM TAKE ONE CAPSULE BY MOUTH ONCE DAILY AS DIRECTED What changed:  See the new instructions.   fexofenadine 180 MG tablet Commonly known as:  ALLEGRA Take 1 tablet (180 mg total) by mouth daily.   Flovent HFA 110 MCG/ACT inhaler Generic drug:  fluticasone INHALE 2 PUFFS INTO THE LUNGS TWICE DAILY   fluticasone 50 MCG/ACT nasal spray Commonly known as:  FLONASE SHAKE LIQUID AND USE 2 SPRAYS IN EACH NOSTRIL DAILY   fluticasone-salmeterol 230-21 MCG/ACT inhaler Commonly known as:  Advair HFA USE 2 INHALATIONS BY MOUTH TWICE DAILY   guaiFENesin 600 MG 12 hr tablet Commonly known as:  Mucinex Take 1 tablet (600 mg total) by mouth 2 (two) times daily.   hydrochlorothiazide 25 MG tablet Commonly known as:  HYDRODIURIL Take 1 tablet by mouth once daily for blood pressure.   Lancets Micro Thin 33G Misc USE TO TEST BLOOD SUGAR ONCE A DAY   levothyroxine 150 MCG tablet Commonly known as:  SYNTHROID TAKE 1 TABLET BY MOUTH DAILY with EXTRA 1/2 TABLET ONCE WEEKLY   metFORMIN 500 MG 24 hr tablet Commonly known as:  GLUCOPHAGE-XR Take 2 tablets in the morning and 1 tablet in the evening for diabetes.   montelukast 10 MG tablet Commonly known as:  SINGULAIR TAKE 1 TABLET(10 MG) BY MOUTH DAILY   multivitamin tablet Take 1 tablet by mouth daily.   nystatin 100000 UNIT/ML suspension Commonly known as:  MYCOSTATIN 5 ml by mouth 4 times daily. swish and swallow or spit   ONE TOUCH ULTRA TEST test strip Generic drug:  glucose blood TEST BLOOD GLUCOSE THREE TIMES DAILY AS DIRECTED   Pazeo 0.7 % Soln Generic drug:  Olopatadine HCl Place 1 drop into both eyes daily.   potassium chloride SA 20 MEQ tablet Commonly known as:  K-DUR Take 1 tablet (20 mEq total) by  mouth daily.       Review of Systems:    DIABETES: He has followed with his PCP, his A1c appears better    Lab Results  Component Value Date   HGBA1C 6.0 (A) 08/10/2018   HGBA1C 7.3 (H) 03/23/2018   HGBA1C 5.7 (H) 09/15/2017   Lab Results  Component Value Date   MICROALBUR 2.6 09/15/2017   LDLCALC 97 04/11/2018   CREATININE 1.47 (H) 04/11/2018      CARDIOLOGY:  history of high blood pressure treated by PCP          Examination:    There were no  vitals taken for this visit.  Examination limited to the radial contact He looks well. Mild swelling of the eyes without significant proptosis   Assessment/plan and recommendations:    Hypothyroidism, post radioactive iodine Continues to be on a stable dose of 150 mcg levothyroxine since 2018 He takes 7-1/2 tablets a week and is compliant with his directions on how to take this  TSH has been consistent in the normal range and the same now   He will continue the same regimen and follow-up in 1 year  There are no Patient Instructions on file for this visit.   Elayne Snare 11/28/2018, 3:26 PM

## 2018-12-06 ENCOUNTER — Ambulatory Visit: Payer: Self-pay

## 2018-12-07 ENCOUNTER — Other Ambulatory Visit: Payer: Self-pay

## 2018-12-07 ENCOUNTER — Ambulatory Visit (INDEPENDENT_AMBULATORY_CARE_PROVIDER_SITE_OTHER): Payer: PPO | Admitting: *Deleted

## 2018-12-07 DIAGNOSIS — J4550 Severe persistent asthma, uncomplicated: Secondary | ICD-10-CM

## 2018-12-10 ENCOUNTER — Other Ambulatory Visit: Payer: Self-pay | Admitting: Allergy

## 2018-12-20 ENCOUNTER — Ambulatory Visit: Payer: Self-pay

## 2018-12-28 ENCOUNTER — Ambulatory Visit (INDEPENDENT_AMBULATORY_CARE_PROVIDER_SITE_OTHER): Payer: PPO | Admitting: *Deleted

## 2018-12-28 ENCOUNTER — Ambulatory Visit: Payer: Self-pay

## 2018-12-28 DIAGNOSIS — J4550 Severe persistent asthma, uncomplicated: Secondary | ICD-10-CM | POA: Diagnosis not present

## 2018-12-31 ENCOUNTER — Other Ambulatory Visit: Payer: Self-pay | Admitting: Endocrinology

## 2019-01-07 ENCOUNTER — Telehealth: Payer: Self-pay | Admitting: Infectious Diseases

## 2019-01-07 NOTE — Telephone Encounter (Signed)
COVID-19 Pre-Screening Questions: 01/07/19 ° °Do you currently have a fever (>100 °F), chills or unexplained body aches? NO ° °Are you currently experiencing new cough, shortness of breath, sore throat, runny nose?NO  °•  °Have you recently travelled outside the state of [place] in the last 14 days?NO °•  °Have you been in contact with someone that is currently pending confirmation of Covid19 testing or has been confirmed to have the Covid19 virus?  NO ° °**If the patient answers NO to ALL questions -  advise the patient to please call the clinic before coming to the office should any symptoms develop.  ° ° ° ° °

## 2019-01-08 ENCOUNTER — Other Ambulatory Visit: Payer: Self-pay

## 2019-01-08 ENCOUNTER — Other Ambulatory Visit (HOSPITAL_COMMUNITY)
Admission: RE | Admit: 2019-01-08 | Discharge: 2019-01-08 | Disposition: A | Discharge status: Home / Self Care | Payer: PPO | Source: Ambulatory Visit | Attending: Infectious Diseases | Admitting: Infectious Diseases

## 2019-01-08 ENCOUNTER — Other Ambulatory Visit: Payer: PPO

## 2019-01-08 DIAGNOSIS — Z79899 Other long term (current) drug therapy: Secondary | ICD-10-CM | POA: Diagnosis not present

## 2019-01-08 DIAGNOSIS — B20 Human immunodeficiency virus [HIV] disease: Secondary | ICD-10-CM

## 2019-01-08 DIAGNOSIS — Z113 Encounter for screening for infections with a predominantly sexual mode of transmission: Secondary | ICD-10-CM | POA: Insufficient documentation

## 2019-01-09 ENCOUNTER — Telehealth: Payer: Self-pay | Admitting: *Deleted

## 2019-01-09 LAB — URINE CYTOLOGY ANCILLARY ONLY
Chlamydia: NEGATIVE
Neisseria Gonorrhea: NEGATIVE

## 2019-01-09 LAB — T-HELPER CELL (CD4) - (RCID CLINIC ONLY)
CD4 % Helper T Cell: 19 % — ABNORMAL LOW (ref 33–65)
CD4 T Cell Abs: 386 /uL — ABNORMAL LOW (ref 400–1790)

## 2019-01-09 NOTE — Telephone Encounter (Signed)
-----   Message from Campbell Riches, MD sent at 01/09/2019  2:13 PM EDT ----- Can  You give him 71meq of KCl orally and have him get repeat potassium level 24h later? thanks

## 2019-01-09 NOTE — Telephone Encounter (Signed)
RN left message with instructions and asked him to call back and confirm.  Patient is already prescribed potassium supplement (20 meq per day).   Landis Gandy, RN

## 2019-01-10 NOTE — Telephone Encounter (Signed)
Is he taking his previous rx?

## 2019-01-10 NOTE — Telephone Encounter (Signed)
Patient called back this am, spoke with Darnelle Maffucci. Per Darnelle Maffucci, patient is currently taking his potassium supplement as prescribed. He will take 40 meq Sunday and come in Monday for lab.

## 2019-01-14 ENCOUNTER — Ambulatory Visit: Payer: PPO

## 2019-01-14 ENCOUNTER — Other Ambulatory Visit: Payer: PPO

## 2019-01-16 ENCOUNTER — Other Ambulatory Visit: Payer: Self-pay | Admitting: Allergy

## 2019-01-16 NOTE — Telephone Encounter (Signed)
Please advise 

## 2019-01-17 ENCOUNTER — Other Ambulatory Visit: Payer: PPO

## 2019-01-17 ENCOUNTER — Other Ambulatory Visit: Payer: Self-pay

## 2019-01-17 ENCOUNTER — Other Ambulatory Visit: Payer: Self-pay | Admitting: *Deleted

## 2019-01-17 ENCOUNTER — Ambulatory Visit (INDEPENDENT_AMBULATORY_CARE_PROVIDER_SITE_OTHER): Payer: PPO | Admitting: *Deleted

## 2019-01-17 DIAGNOSIS — J4550 Severe persistent asthma, uncomplicated: Secondary | ICD-10-CM | POA: Diagnosis not present

## 2019-01-17 DIAGNOSIS — I10 Essential (primary) hypertension: Secondary | ICD-10-CM

## 2019-01-17 LAB — CBC
HCT: 50.7 % — ABNORMAL HIGH (ref 38.5–50.0)
Hemoglobin: 16.5 g/dL (ref 13.2–17.1)
MCH: 26.4 pg — ABNORMAL LOW (ref 27.0–33.0)
MCHC: 32.5 g/dL (ref 32.0–36.0)
MCV: 81.1 fL (ref 80.0–100.0)
Platelets: 251 10*3/uL (ref 140–400)
RBC: 6.25 10*6/uL — ABNORMAL HIGH (ref 4.20–5.80)
RDW: 13.8 % (ref 11.0–15.0)
WBC: 5 10*3/uL (ref 3.8–10.8)

## 2019-01-17 LAB — COMPREHENSIVE METABOLIC PANEL
AG Ratio: 1.5 (calc) (ref 1.0–2.5)
ALT: 20 U/L (ref 9–46)
AST: 16 U/L (ref 10–40)
Albumin: 4.7 g/dL (ref 3.6–5.1)
Alkaline phosphatase (APISO): 49 U/L (ref 36–130)
BUN: 19 mg/dL (ref 7–25)
CO2: 31 mmol/L (ref 20–32)
Calcium: 10.6 mg/dL — ABNORMAL HIGH (ref 8.6–10.3)
Chloride: 97 mmol/L — ABNORMAL LOW (ref 98–110)
Creat: 1.24 mg/dL (ref 0.60–1.35)
Globulin: 3.1 g/dL (calc) (ref 1.9–3.7)
Glucose, Bld: 123 mg/dL — ABNORMAL HIGH (ref 65–99)
Potassium: 3.3 mmol/L — ABNORMAL LOW (ref 3.5–5.3)
Sodium: 142 mmol/L (ref 135–146)
Total Bilirubin: 0.9 mg/dL (ref 0.2–1.2)
Total Protein: 7.8 g/dL (ref 6.1–8.1)

## 2019-01-17 LAB — LIPID PANEL
Cholesterol: 184 mg/dL (ref <200)
HDL: 56 mg/dL (ref >=40)
LDL Cholesterol (Calc): 100 mg/dL (calc) — ABNORMAL HIGH
Non-HDL Cholesterol (Calc): 128 mg/dL (calc) (ref <130)
Total CHOL/HDL Ratio: 3.3 (calc) (ref <5.0)
Triglycerides: 189 mg/dL — ABNORMAL HIGH (ref <150)

## 2019-01-17 LAB — FLUORESCENT TREPONEMAL AB(FTA)-IGG-BLD: Fluorescent Treponemal ABS: REACTIVE — AB

## 2019-01-17 LAB — HIV-1 RNA QUANT-NO REFLEX-BLD
HIV 1 RNA Quant: <20 copies/mL — AB
HIV-1 RNA Quant, Log: <1.3 Log copies/mL — AB

## 2019-01-17 LAB — RPR: RPR Ser Ql: REACTIVE — AB

## 2019-01-17 LAB — RPR TITER: RPR Titer: 1:1 {titer} — ABNORMAL HIGH

## 2019-01-22 ENCOUNTER — Other Ambulatory Visit: Payer: Self-pay

## 2019-01-22 ENCOUNTER — Encounter: Payer: PPO | Admitting: Infectious Diseases

## 2019-01-22 ENCOUNTER — Other Ambulatory Visit: Payer: PPO

## 2019-01-22 DIAGNOSIS — I10 Essential (primary) hypertension: Secondary | ICD-10-CM

## 2019-01-22 LAB — POTASSIUM: Potassium: 3.6 mmol/L (ref 3.5–5.3)

## 2019-01-31 ENCOUNTER — Ambulatory Visit: Payer: Self-pay

## 2019-01-31 ENCOUNTER — Ambulatory Visit: Payer: PPO | Admitting: Infectious Diseases

## 2019-02-07 ENCOUNTER — Encounter: Payer: Self-pay | Admitting: Infectious Diseases

## 2019-02-07 ENCOUNTER — Ambulatory Visit (INDEPENDENT_AMBULATORY_CARE_PROVIDER_SITE_OTHER): Payer: PPO | Admitting: Infectious Diseases

## 2019-02-07 ENCOUNTER — Other Ambulatory Visit: Payer: Self-pay

## 2019-02-07 VITALS — BP 137/89 | HR 82 | Temp 98.5°F

## 2019-02-07 DIAGNOSIS — E1122 Type 2 diabetes mellitus with diabetic chronic kidney disease: Secondary | ICD-10-CM

## 2019-02-07 DIAGNOSIS — J4550 Severe persistent asthma, uncomplicated: Secondary | ICD-10-CM

## 2019-02-07 DIAGNOSIS — N182 Chronic kidney disease, stage 2 (mild): Secondary | ICD-10-CM | POA: Diagnosis not present

## 2019-02-07 DIAGNOSIS — B20 Human immunodeficiency virus [HIV] disease: Secondary | ICD-10-CM | POA: Diagnosis not present

## 2019-02-07 DIAGNOSIS — Z79899 Other long term (current) drug therapy: Secondary | ICD-10-CM | POA: Diagnosis not present

## 2019-02-07 DIAGNOSIS — I10 Essential (primary) hypertension: Secondary | ICD-10-CM

## 2019-02-07 DIAGNOSIS — C469 Kaposi's sarcoma, unspecified: Secondary | ICD-10-CM

## 2019-02-07 DIAGNOSIS — Z113 Encounter for screening for infections with a predominantly sexual mode of transmission: Secondary | ICD-10-CM | POA: Diagnosis not present

## 2019-02-07 MED ORDER — TRIUMEQ 600-50-300 MG PO TABS: 1.0000 | ORAL_TABLET | Freq: Every day | ORAL | 1 refills | Status: DC

## 2019-02-07 NOTE — Progress Notes (Signed)
   Subjective:    Patient ID: Steven [name], male    DOB: 04-Jan-1981, 38 y.o.   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 He is being monitored for his KS by Onc.  He was previously on CTX however in 2011 he had an intracranial bleed.   He was changed from TRV/ISN in 2014 to triumeq. He had f/u with Onc on 05-23-16, was felt to be doing well. He also has been seen by[facility] for hypothyroidism and DM2. 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 DJ and music mixes.  Has been working on house projects.  FSG at home have been good (75-80 in AM, PM 112-120). At last blood draw Cr better / NL and Glc 123. He had low K+ which corrected on repeat.  Has gotten pill box.   HIV 1 RNA Quant (copies/mL)  Date Value  01/08/2019 <20 DETECTED (A)  04/11/2018 <20 NOT DETECTED  05/24/2017 21 (H)   CD4 T Cell Abs (/uL)  Date Value  01/08/2019 386 (L)  04/11/2018 550  05/24/2017 560   Needs ophtho this year.   Review of Systems  Constitutional: Negative for appetite change, chills, fever and unexpected weight change.  Eyes: Negative for visual disturbance.  Respiratory: Negative for cough and shortness of breath.   Cardiovascular: Positive for leg swelling.  Gastrointestinal: Negative for diarrhea and nausea.  Genitourinary: Negative for difficulty urinating.  Neurological: Negative for numbness.       Objective:   Physical Exam Constitutional:      Appearance: Normal appearance.  HENT:     Mouth/Throat:     Mouth: Mucous membranes are moist.     Pharynx: No oropharyngeal exudate.  Eyes:     Extraocular Movements: Extraocular movements intact.     Pupils: Pupils are equal, round, and reactive to light.  Neck:     Musculoskeletal: Normal range of motion and neck supple.  Cardiovascular:     Rate and Rhythm: Normal rate and regular rhythm.  Pulmonary:     Effort: Pulmonary effort is normal.     Breath sounds: Normal breath sounds.   Abdominal:     General: Bowel sounds are normal. There is no distension.     Palpations: Abdomen is soft.  Musculoskeletal:     Right lower leg: Edema present.     Left lower leg: Edema present.  Neurological:     General: No focal deficit present.     Mental Status: He is alert.  Psychiatric:        Mood and Affect: Mood normal.       Assessment & Plan:

## 2019-02-07 NOTE — Assessment & Plan Note (Signed)
He is doing well Not active.  Needs to watch wt vax are up to date Is social distancing.  rtc in 9 months

## 2019-02-07 NOTE — Assessment & Plan Note (Signed)
Appears to be well controlled on his current rx.  He denies SOB or cough.

## 2019-02-07 NOTE — Assessment & Plan Note (Signed)
Well controlled  He follows with Dr Benay Spice yearly

## 2019-02-07 NOTE — Assessment & Plan Note (Signed)
He will call and arrange podiatry and ophtho.  Last A1C was 6%!

## 2019-02-07 NOTE — Assessment & Plan Note (Signed)
Slightly elevated Will continue on his current rx and f/u with pcp

## 2019-02-08 ENCOUNTER — Other Ambulatory Visit: Payer: Self-pay | Admitting: Primary Care

## 2019-02-08 ENCOUNTER — Encounter: Payer: Self-pay | Admitting: Primary Care

## 2019-02-08 ENCOUNTER — Ambulatory Visit (INDEPENDENT_AMBULATORY_CARE_PROVIDER_SITE_OTHER): Payer: PPO | Admitting: Primary Care

## 2019-02-08 VITALS — Ht 73.0 in | Wt 283.0 lb

## 2019-02-08 DIAGNOSIS — E1122 Type 2 diabetes mellitus with diabetic chronic kidney disease: Secondary | ICD-10-CM | POA: Diagnosis not present

## 2019-02-08 DIAGNOSIS — K219 Gastro-esophageal reflux disease without esophagitis: Secondary | ICD-10-CM

## 2019-02-08 DIAGNOSIS — E890 Postprocedural hypothyroidism: Secondary | ICD-10-CM

## 2019-02-08 DIAGNOSIS — I10 Essential (primary) hypertension: Secondary | ICD-10-CM

## 2019-02-08 DIAGNOSIS — H1010 Acute atopic conjunctivitis, unspecified eye: Secondary | ICD-10-CM | POA: Diagnosis not present

## 2019-02-08 DIAGNOSIS — J3089 Other allergic rhinitis: Secondary | ICD-10-CM

## 2019-02-08 DIAGNOSIS — N183 Chronic kidney disease, stage 3 unspecified: Secondary | ICD-10-CM

## 2019-02-08 DIAGNOSIS — J302 Other seasonal allergic rhinitis: Secondary | ICD-10-CM

## 2019-02-08 DIAGNOSIS — B20 Human immunodeficiency virus [HIV] disease: Secondary | ICD-10-CM

## 2019-02-08 DIAGNOSIS — N182 Chronic kidney disease, stage 2 (mild): Secondary | ICD-10-CM

## 2019-02-08 DIAGNOSIS — J4550 Severe persistent asthma, uncomplicated: Secondary | ICD-10-CM | POA: Diagnosis not present

## 2019-02-08 MED ORDER — HYDROCHLOROTHIAZIDE 25 MG PO TABS: ORAL_TABLET | ORAL | 3 refills | Status: DC

## 2019-02-08 MED ORDER — AMLODIPINE BESYLATE 10 MG PO TABS: 10.0000 mg | ORAL_TABLET | Freq: Every day | ORAL | 3 refills | Status: DC

## 2019-02-08 MED ORDER — POTASSIUM CHLORIDE CRYS ER 20 MEQ PO TBCR: 20.0000 meq | EXTENDED_RELEASE_TABLET | Freq: Every day | ORAL | 3 refills | Status: DC

## 2019-02-08 NOTE — Assessment & Plan Note (Signed)
Doing well on Singulair and asthma treatment. Following with allergy and asthma.

## 2019-02-08 NOTE — Patient Instructions (Signed)
Call the main line to schedule a fasting lab only appointment for next week.  Make sure to drink plenty of water daily.  Avoid recurrent use of medications such as ibuprofen, Advil, Motrin, Aleve, naproxen.  These can be toxic to the kidneys.  Schedule a physical with me in 6 months.  It was a pleasure to see you today! [name], NP-C

## 2019-02-08 NOTE — Progress Notes (Signed)
Subjective:    Patient ID: Steven [name], male    DOB: 13-Oct-1980, 38 y.o.   MRN: [id number]  HPI  Virtual Visit via Video Note  I connected with [name] on 02/08/19 at  3:20 PM EDT by a video enabled telemedicine application and verified that I am speaking with the correct person using two identifiers.  Location: Patient: Home Provider: Office   I discussed the limitations of evaluation and management by telemedicine and the availability of in person appointments. The patient expressed understanding and agreed to proceed.  History of Present Illness:  Steven [name] is a 38 year old male who presents today for follow up.  1) Type 2 Diabetes:  Current medications include: Metformin ER 1000 mg in Am and 500 mg in Pm.  He is checking his blood glucose 2 times daily and is getting readings of:  AM fasting: 75-140 2 hours after dinner: 100-120  Last A1C: 6.0 in January 2020 Last Eye Exam: No recent exam, due August 5th, 2020. Last Foot Exam: Due next visit  Pneumonia Vaccination: Completed in 2019 ACE/ARB: None. Urine microalbumin  Statin: None. LDL of   2) Essential Hypertension: Currently managed on Amlodipine 10 mg daily and HCTZ 25 mg. He denies chest pain, dizziness, shortness of breath.   BP Readings from Last 3 Encounters:  02/07/19 137/89  08/23/18 122/70  08/10/18 120/74    3) CKD Stage III: Creatinine of 1.24 on labs from June 2020.  He is compliant to blood pressure medications and oral potassium.  Denies recurrent use of NSAIDs.  4) Asthma/Seasonal Allergies: Managed on Singulair, Advair BID, Xolair every two weeks. He uses his Flovent and albuterol inhalers as needed. Typically uses albuterol once weekly.  Following with asthma and allergy clinic.  5) Hypothyroidism: TSH of 1.86 on labs from May 2020. He is taking his levothyroxine 150 mcg every morning and an extra 75 mg on Fridays.  He is following with endocrinology.    Observations/Objective:  Alert and oriented. Appears well, not sickly. No distress. Speaking in complete sentences.   Assessment and Plan:  See problem based charting  Follow Up Instructions:  Call the main line to schedule a fasting lab only appointment for next week.  Make sure to drink plenty of water daily.  Avoid recurrent use of medications such as ibuprofen, Advil, Motrin, Aleve, naproxen.  These can be toxic to the kidneys.  Schedule a physical with me in 6 months.  It was a pleasure to see you today! [name], NP-C    I discussed the assessment and treatment plan with the patient. The patient was provided an opportunity to ask questions and all were answered. The patient agreed with the plan and demonstrated an understanding of the instructions.   The patient was advised to call back or seek an in-person evaluation if the symptoms worsen or if the condition fails to improve as anticipated.     Pleas [name], NP    Review of Systems  Eyes: Negative for visual disturbance.  Respiratory: Negative for shortness of breath.   Cardiovascular: Negative for chest pain.  Neurological: Negative for dizziness and headaches.       Past Medical History:  Diagnosis Date  . Asthma   . Cancer ([place])   . Diabetes mellitus without complication ([place])   . 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 HTN (hypertension)   . Kaposi's sarcoma   . Syphilis 03/23/09-   1:2  Social History   Socioeconomic History  . Marital status: Single    Spouse name: Not on file  . Number of children: Not on file  . Years of education: Not on file  . Highest education level: Not on file  Occupational History  . Not on file  Social Needs  . Financial resource strain: Not on file  . Food insecurity    Worry: Not on file    Inability: Not on file  . Transportation needs    Medical: Not on file    Non-medical: Not on file  Tobacco Use  . Smoking status: Never  Smoker  . Smokeless tobacco: Never Used  Substance and Sexual Activity  . Alcohol use: Yes    Alcohol/week: 0.0 standard drinks    Comment: socially - less now  . Drug use: No  . Sexual activity: Not Currently    Partners: Male    Birth control/protection: Condom    Comment: pt. declined condoms  Lifestyle  . Physical activity    Days per week: Not on file    Minutes per session: Not on file  . Stress: Not on file  Relationships  . Social Herbalist on phone: Not on file    Gets together: Not on file    Attends religious service: Not on file    Active member of club or organization: Not on file    Attends meetings of clubs or organizations: Not on file    Relationship status: Not on file  . Intimate partner violence    Fear of current or ex partner: Not on file    Emotionally abused: Not on file    Physically abused: Not on file    Forced sexual activity: Not on file  Other Topics Concern  . Not on file  Social History Narrative   Single.    No children.   Works in Therapist, art   Enjoys DJ, traveling.     Past Surgical History:  Procedure Laterality Date  . BRAIN SURGERY    . IR GENERIC HISTORICAL  02/12/2016   IR REMOVAL TUN ACCESS W/ PORT W/O FL MOD SED 02/12/2016 [name], MD [facility]-INTER[facility] RAD    Family History  Problem Relation Age of Onset  . Pancreatic cancer Mother   . Cancer Mother   . Diabetes Paternal Grandmother   . Thyroid disease Paternal Grandmother     Allergies  Allergen Reactions  . Lisinopril Swelling    Swelling of lower lip while on lisinopril  . Penicillins Hives and Swelling    REACTION: rash and swelling  . Levaquin [Levofloxacin In D5w] Rash  . Tessalon [Benzonatate] Rash  . Aspirin Nausea Only    Current Outpatient Medications on File Prior to Visit  Medication Sig Dispense Refill  . abacavir-dolutegravir-lamiVUDine (TRIUMEQ) 600-50-300 MG tablet Take 1 tablet by mouth daily. 90 tablet 1  . albuterol (VENTOLIN HFA) 108  (90 Base) MCG/ACT inhaler INHALE 2 PUFFS BY MOUTH EVERY 4 TO 6 HOURS AS NEEDED FOR COUGH OR WHEEZING 42.5 g 1  . clotrimazole (MYCELEX) 10 MG troche Take 1 tablet (10 mg total) by mouth 5 (five) times daily. 35 tablet 0  . esomeprazole (NEXIUM) 40 MG capsule TAKE ONE CAPSULE BY MOUTH ONCE DAILY AS DIRECTED (Patient taking differently: Take 40 mg by mouth daily. ) 30 capsule 0  . fexofenadine (ALLEGRA) 180 MG tablet Take 1 tablet (180 mg total) by mouth daily. 15 tablet 0  . FLOVENT HFA  110 MCG/ACT inhaler INHALE 2 PUFFS INTO THE LUNGS TWICE DAILY 12 g 2  . fluticasone (FLONASE) 50 MCG/ACT nasal spray SHAKE LIQUID AND USE 2 SPRAYS IN EACH NOSTRIL DAILY 16 g 5  . fluticasone-salmeterol (ADVAIR HFA) 230-21 MCG/ACT inhaler USE 2 INHALATIONS BY MOUTH TWICE DAILY 12 g 5  . guaiFENesin (MUCINEX) 600 MG 12 hr tablet Take 1 tablet (600 mg total) by mouth 2 (two) times daily. 15 tablet 0  . LANCETS MICRO THIN 33G MISC USE TO TEST BLOOD SUGAR ONCE A DAY 100 each 2  . levothyroxine (SYNTHROID) 150 MCG tablet TAKE 1 TABLET BY MOUTH DAILY WITH AN EXTRA 1/2 TABLET ONCE WEEKLY 96 tablet 2  . metFORMIN (GLUCOPHAGE-XR) 500 MG 24 hr tablet Take 2 tablets in the morning and 1 tablet in the evening for diabetes. 270 tablet 3  . montelukast (SINGULAIR) 10 MG tablet TAKE 1 TABLET(10 MG) BY MOUTH DAILY 90 tablet 1  . Multiple Vitamin (MULTIVITAMIN) tablet Take 1 tablet by mouth daily.    Marland Kitchen nystatin (MYCOSTATIN) 100000 UNIT/ML suspension 5 ml by mouth 4 times daily. swish and swallow or spit 110 mL 0  . ONE TOUCH ULTRA TEST test strip TEST BLOOD GLUCOSE THREE TIMES DAILY AS DIRECTED 100 each 2  . PAZEO 0.7 % SOLN Place 1 drop into both eyes daily. 1 Bottle 5  . XOLAIR 150 MG injection INJECT 300 MG UNDER THE SKIN EVERY TWO WEEKS 4 each 11   Current Facility-Administered Medications on File Prior to Visit  Medication Dose Route Frequency Provider Last Rate Last Dose  . omalizumab Arvid Right) injection 300 mg  300 mg  Subcutaneous Q14 Days Kozlow, Donnamarie Poag, MD   300 mg at 01/17/19 1526    Ht 6\' 1"  (1.854 m)   Wt 283 lb (128.4 kg)   BMI 37.34 kg/m    Objective:   Physical Exam  Constitutional: He is oriented to person, place, and time. He appears well-nourished.  Respiratory: Effort normal.  Neurological: He is alert and oriented to person, place, and time.  Psychiatric: He has a normal mood and affect.           Assessment & Plan:

## 2019-02-08 NOTE — Assessment & Plan Note (Signed)
Endorses good control on current regimen. Continue prescribed regimen, follow with asthma and allergy clinic.

## 2019-02-08 NOTE — Assessment & Plan Note (Signed)
Following with endocrinology. TSH from January 2020 within normal range. Continue current regimen.

## 2019-02-08 NOTE — Assessment & Plan Note (Signed)
Repeat A1c pending. Compliant to metformin as prescribed.  Urine microalbumin pending. Lipid panel pending, not managed on statin. Allergy to ACE, will also avoid ARB. Pneumonia vaccination up-to-date. Eye exam scheduled for next week.  Follow-up in 6 months.

## 2019-02-08 NOTE — Assessment & Plan Note (Signed)
Following with infectious disease, recent levels undetectable.  He endorses excellent compliance to his prescribed regimen.  Continue same.

## 2019-02-08 NOTE — Assessment & Plan Note (Addendum)
Historically with good control based off of chart, borderline with recent check. Discussed for him to monitor blood pressure and notify me if he continues to run in the 694W systolic and at or above 90 diastolic.  Continue current regimen for now. Continue dietary changes.

## 2019-02-08 NOTE — Assessment & Plan Note (Signed)
Well-controlled on Nexium 40 mg daily. Encouraged weight loss, avoid triggers of esophageal reflux.  Continue current regimen.

## 2019-02-08 NOTE — Assessment & Plan Note (Signed)
Improved on recent labs, continue to monitor. Stressed importance of proper hydration with water and good blood pressure control.

## 2019-02-25 ENCOUNTER — Ambulatory Visit (INDEPENDENT_AMBULATORY_CARE_PROVIDER_SITE_OTHER): Payer: PPO | Admitting: Allergy

## 2019-02-25 ENCOUNTER — Encounter: Payer: Self-pay | Admitting: Allergy

## 2019-02-25 ENCOUNTER — Other Ambulatory Visit: Payer: Self-pay

## 2019-02-25 VITALS — BP 126/80 | HR 80 | Temp 97.2°F | Resp 18

## 2019-02-25 DIAGNOSIS — M25531 Pain in right wrist: Secondary | ICD-10-CM | POA: Diagnosis not present

## 2019-02-25 DIAGNOSIS — J3089 Other allergic rhinitis: Secondary | ICD-10-CM | POA: Diagnosis not present

## 2019-02-25 DIAGNOSIS — J4550 Severe persistent asthma, uncomplicated: Secondary | ICD-10-CM

## 2019-02-25 DIAGNOSIS — J302 Other seasonal allergic rhinitis: Secondary | ICD-10-CM

## 2019-02-25 DIAGNOSIS — Z91018 Allergy to other foods: Secondary | ICD-10-CM | POA: Diagnosis not present

## 2019-02-25 DIAGNOSIS — H1010 Acute atopic conjunctivitis, unspecified eye: Secondary | ICD-10-CM | POA: Diagnosis not present

## 2019-02-25 DIAGNOSIS — K219 Gastro-esophageal reflux disease without esophagitis: Secondary | ICD-10-CM

## 2019-02-25 MED ORDER — MONTELUKAST SODIUM 10 MG PO TABS: ORAL_TABLET | ORAL | 2 refills | Status: DC

## 2019-02-25 NOTE — Assessment & Plan Note (Signed)
Past history -  2019 immunocap was slightly positive: alpha gal, wheat, corn, peanut, soy, egg, shellfish. Patient tolerates all of the above foods with no issues except for the shellfish.  Interim history - No accidental ingestion.   Continue to avoid shellfish and mushroom.  For mild symptoms you can take over the counter antihistamines such as Benadryl and monitor symptoms closely. If symptoms worsen or if you have severe symptoms including breathing issues, throat closure, significant swelling, whole body hives, severe diarrhea and vomiting, lightheadedness then inject epinephrine and seek immediate medical care afterwards.

## 2019-02-25 NOTE — Progress Notes (Signed)
Follow Up Note  RE: Steven [name] MRN: [id number] DOB: 11-Dec-1980 Date of Office Visit: 02/25/2019  Referring provider: Pleas [name], NP Primary care provider: Pleas [name], NP  Chief Complaint: Asthma (doing well. no issues with his asthma. he has used albuterol maximum 2x in 1 month. )  History of Present Illness: I had the pleasure of seeing Steven [name] for a follow up visit at the [facility] on 02/25/2019. He is a 38 y.o. male, who is being followed for asthma, allergic rhino conjunctivitis, food allergy. Today he is here for regular follow up visit. His previous allergy office visit was on 11/22/2018 with Dr. Maudie Mercury.   Asthma, severe persistent, well-controlled Used albuterol only twice the last month. Currently on Advair 230 2 puffs BID and Xolair injections every 2 weeks with good benefit.  Denies any SOB, coughing, wheezing, chest tightness, nocturnal awakenings, ER/urgent care visits or prednisone use since the last visit.  Seasonal and perennial allergic rhinoconjunctivitis  Using Flonase 1-2 sprays daily as needed.  Taking Singulair 10mg  daily and allegra daily.  No eye drop use.  Food allergy Currently avoiding shellfish and mushroom. No reactions since the last visit.   Gastroesophageal reflux disease Well-controlled with Nexium 40mg  daily.   Assessment and Plan: Steven [name] is a 38 y.o. male with: Asthma, severe persistent, well-controlled Well-controlled with below regimen.  Today's spirometry was unremarkable.   Daily controller medication(s):continue Advair 230 2 puffs twice a day with spacer and rinse mouth afterwards ? Continue Xolair 300mg  every 14 days   Prior to physical activity:May use albuterol rescue inhaler 2 puffs 5 to 15 minutes prior to strenuous physical activities.  Rescue medications:May use albuterol rescue inhaler 2 puffs or nebulizer every 4 to 6 hours as needed for shortness of breath, chest tightness,  coughing, and wheezing. Monitor frequency of use.   During upper respiratory infections: Start Qvar 80 2 puffs twice a day for 1-2 weeks.  Seasonal and perennial allergic rhinoconjunctivitis Past history - 2015 skin testing was positive to grass, ragweed, weed, mold, dust mites, cat and dog. Interim history - well controlled.   Take Flonase 1-2 sprays daily as needed for nasal congestion.   May use over the counter antihistamines such as Zyrtec (cetirizine), Claritin (loratadine), Allegra (fexofenadine), or Xyzal (levocetirizine) daily as needed.  May usepazeoeye drops daily as needed for itchy eyes.   Continue environmental control measures.   Food allergy Past history -  2019 immunocap was slightly positive: alpha gal, wheat, corn, peanut, soy, egg, shellfish. Patient tolerates all of the above foods with no issues except for the shellfish.  Interim history - No accidental ingestion.   Continue to avoid shellfish and mushroom.  For mild symptoms you can take over the counter antihistamines such as Benadryl and monitor symptoms closely. If symptoms worsen or if you have severe symptoms including breathing issues, throat closure, significant swelling, whole body hives, severe diarrhea and vomiting, lightheadedness then inject epinephrine and seek immediate medical care afterwards.  Gastroesophageal reflux disease Well-controlled.  Continue medication - Nexium 40mg  daily.   Continue lifestyle modification diet.   Return in about 3 months (around 05/28/2019).  Meds ordered this encounter  Medications  . montelukast (SINGULAIR) 10 MG tablet    Sig: TAKE 1 TABLET(10 MG) BY MOUTH DAILY    Dispense:  90 tablet    Refill:  2    **Patient requests 90 days supply**   Diagnostics: Spirometry:  Tracings reviewed. His effort: Good  reproducible efforts. FVC: 3.27L FEV1: 2.37L, 65% predicted FEV1/FVC ratio: 72% Interpretation: Spirometry consistent with possible restrictive  disease.  Please see scanned spirometry results for details.  Medication List:  Current Outpatient Medications  Medication Sig Dispense Refill  . abacavir-dolutegravir-lamiVUDine (TRIUMEQ) 600-50-300 MG tablet Take 1 tablet by mouth daily. 90 tablet 1  . albuterol (VENTOLIN HFA) 108 (90 Base) MCG/ACT inhaler INHALE 2 PUFFS BY MOUTH EVERY 4 TO 6 HOURS AS NEEDED FOR COUGH OR WHEEZING 42.5 g 1  . amLODipine (NORVASC) 10 MG tablet Take 1 tablet (10 mg total) by mouth daily. For blood pressure. 90 tablet 3  . clotrimazole (MYCELEX) 10 MG troche Take 1 tablet (10 mg total) by mouth 5 (five) times daily. 35 tablet 0  . esomeprazole (NEXIUM) 40 MG capsule TAKE ONE CAPSULE BY MOUTH ONCE DAILY AS DIRECTED (Patient taking differently: Take 40 mg by mouth daily. ) 30 capsule 0  . fexofenadine (ALLEGRA) 180 MG tablet Take 1 tablet (180 mg total) by mouth daily. 15 tablet 0  . FLOVENT HFA 110 MCG/ACT inhaler INHALE 2 PUFFS INTO THE LUNGS TWICE DAILY 12 g 2  . fluticasone (FLONASE) 50 MCG/ACT nasal spray SHAKE LIQUID AND USE 2 SPRAYS IN EACH NOSTRIL DAILY 16 g 5  . fluticasone-salmeterol (ADVAIR HFA) 230-21 MCG/ACT inhaler USE 2 INHALATIONS BY MOUTH TWICE DAILY 12 g 5  . guaiFENesin (MUCINEX) 600 MG 12 hr tablet Take 1 tablet (600 mg total) by mouth 2 (two) times daily. 15 tablet 0  . hydrochlorothiazide (HYDRODIURIL) 25 MG tablet Take 1 tablet by mouth once daily for blood pressure. 90 tablet 3  . LANCETS MICRO THIN 33G MISC USE TO TEST BLOOD SUGAR ONCE A DAY 100 each 2  . levothyroxine (SYNTHROID) 150 MCG tablet TAKE 1 TABLET BY MOUTH DAILY WITH AN EXTRA 1/2 TABLET ONCE WEEKLY 96 tablet 2  . metFORMIN (GLUCOPHAGE-XR) 500 MG 24 hr tablet Take 2 tablets in the morning and 1 tablet in the evening for diabetes. 270 tablet 3  . montelukast (SINGULAIR) 10 MG tablet TAKE 1 TABLET(10 MG) BY MOUTH DAILY 90 tablet 2  . Multiple Vitamin (MULTIVITAMIN) tablet Take 1 tablet by mouth daily.    Marland Kitchen nystatin (MYCOSTATIN)  100000 UNIT/ML suspension 5 ml by mouth 4 times daily. swish and swallow or spit 110 mL 0  . ONE TOUCH ULTRA TEST test strip TEST BLOOD GLUCOSE THREE TIMES DAILY AS DIRECTED 100 each 2  . PAZEO 0.7 % SOLN Place 1 drop into both eyes daily. 1 Bottle 5  . potassium chloride SA (K-DUR) 20 MEQ tablet Take 1 tablet (20 mEq total) by mouth daily. 90 tablet 3  . XOLAIR 150 MG injection INJECT 300 MG UNDER THE SKIN EVERY TWO WEEKS 4 each 11   Current Facility-Administered Medications  Medication Dose Route Frequency Provider Last Rate Last Dose  . omalizumab Arvid Right) injection 300 mg  300 mg Subcutaneous Q14 Days Jiles Prows, MD   300 mg at 02/25/19 1603   Allergies: Allergies  Allergen Reactions  . Lisinopril Swelling    Swelling of lower lip while on lisinopril  . Penicillins Hives and Swelling    REACTION: rash and swelling  . Levaquin [Levofloxacin In D5w] Rash  . Tessalon [Benzonatate] Rash  . Aspirin Nausea Only   I reviewed his past medical history, social history, family history, and environmental history and no significant changes have been reported from previous visit on 11/22/2018.  Review of Systems  Constitutional: Negative for appetite change, chills,  fever and unexpected weight change.  HENT: Negative for congestion and rhinorrhea.   Eyes: Negative for itching.  Respiratory: Negative for cough, chest tightness, shortness of breath and wheezing.   Gastrointestinal: Negative for abdominal pain.  Skin: Negative for rash.  Allergic/Immunologic: Positive for environmental allergies and food allergies.  Neurological: Negative for headaches.   Objective: BP 126/80 (BP Location: Left Arm, Patient Position: Sitting, Cuff Size: Large)   Pulse 80   Temp (!) 97.2 F (36.2 C) (Temporal)   Resp 18   SpO2 97%  There is no height or weight on file to calculate BMI. Physical Exam  Constitutional: He is oriented to person, place, and time. He appears well-developed and  well-nourished.  HENT:  Head: Normocephalic and atraumatic.  Right Ear: External ear normal.  Left Ear: External ear normal.  Nose: Nose normal.  Mouth/Throat: Oropharynx is clear and moist.  Eyes: Conjunctivae and EOM are normal.  Neck: Neck supple.  Cardiovascular: Normal rate, regular rhythm and normal heart sounds. Exam reveals no gallop and no friction rub.  No murmur heard. Pulmonary/Chest: Effort normal and breath sounds normal. He has no wheezes. He has no rales.  Neurological: He is alert and oriented to person, place, and time.  Skin: Skin is warm. No rash noted.  Psychiatric: He has a normal mood and affect. His behavior is normal.  Nursing note and vitals reviewed.  Previous notes and tests were reviewed. The plan was reviewed with the patient/family, and all questions/concerned were addressed.  It was my pleasure to see Steven [name] today and participate in his care. Please feel free to contact me with any questions or concerns.  Sincerely,  Rexene Alberts, DO Allergy & Immunology  [facility] office: [phone] [facility] office: [place] office: [phone]

## 2019-02-25 NOTE — Assessment & Plan Note (Signed)
Past history - 2015 skin testing was positive to grass, ragweed, weed, mold, dust mites, cat and dog. Interim history - well controlled.   Take Flonase 1-2 sprays daily as needed for nasal congestion.   May use over the counter antihistamines such as Zyrtec (cetirizine), Claritin (loratadine), Allegra (fexofenadine), or Xyzal (levocetirizine) daily as needed.  May usepazeoeye drops daily as needed for itchy eyes.   Continue environmental control measures.

## 2019-02-25 NOTE — Assessment & Plan Note (Signed)
Well-controlled with below regimen.  Today's spirometry was unremarkable.   Daily controller medication(s):continue Advair 230 2 puffs twice a day with spacer and rinse mouth afterwards ? Continue Xolair 300mg  every 14 days   Prior to physical activity:May use albuterol rescue inhaler 2 puffs 5 to 15 minutes prior to strenuous physical activities.  Rescue medications:May use albuterol rescue inhaler 2 puffs or nebulizer every 4 to 6 hours as needed for shortness of breath, chest tightness, coughing, and wheezing. Monitor frequency of use.   During upper respiratory infections: Start Qvar 80 2 puffs twice a day for 1-2 weeks.

## 2019-02-25 NOTE — Patient Instructions (Addendum)
Asthma, severe persistent, well-controlled  Daily controller medication(s):continue Advair 230 2 puffs twice a day with spacer and rinse mouth afterwards ? Continue Xolair 300mg  every 14 days   Prior to physical activity:May use albuterol rescue inhaler 2 puffs 5 to 15 minutes prior to strenuous physical activities.  Rescue medications:May use albuterol rescue inhaler 2 puffs or nebulizer every 4 to 6 hours as needed for shortness of breath, chest tightness, coughing, and wheezing. Monitor frequency of use.   During upper respiratory infections: Start Qvar 80 2 puffs twice a day for 1-2 weeks. Asthma control goals:  Full participation in all desired activities (may need albuterol before activity) Albuterol use two times or less a week on average (not counting use with activity) Cough interfering with sleep two times or less a month Oral steroids no more than once a year No hospitalizations  Seasonal and perennial allergic rhinoconjunctivitis Past history - 2015 skin testing was positive to grass, ragweed, weed, mold, dust mites, cat and dog. Interim history - stable with below regimen.  Take Flonase 1-2 sprays daily as needed for nasal congestion.   May use over the counter antihistamines such as Zyrtec (cetirizine), Claritin (loratadine), Allegra (fexofenadine), or Xyzal (levocetirizine) daily as needed.  May usepazeoeye drops daily as needed for itchy eyes.  Continue environmental control measures.   Food allergy  Continue to avoid shellfish and mushroom.  For mild symptoms you can take over the counter antihistamines such as Benadryl and monitor symptoms closely. If symptoms worsen or if you have severe symptoms including breathing issues, throat closure, significant swelling, whole body hives, severe diarrhea and vomiting, lightheadedness then inject epinephrine and seek immediate medical care afterwards.  Gastroesophageal reflux disease Well-controlled with Nexium  40mg  daily.   Continue medication.   Continue lifestyle modification diet.   Get flu shot in the fall.   Follow up in 3-4 months

## 2019-02-25 NOTE — Assessment & Plan Note (Signed)
Well-controlled.  Continue medication - Nexium 40mg  daily.   Continue lifestyle modification diet.

## 2019-02-26 ENCOUNTER — Encounter (HOSPITAL_COMMUNITY): Payer: Self-pay | Admitting: Emergency Medicine

## 2019-02-26 ENCOUNTER — Emergency Department (HOSPITAL_COMMUNITY)
Admission: EM | Admit: 2019-02-26 | Discharge: 2019-02-26 | Disposition: A | Discharge status: Home / Self Care | Payer: PPO | Attending: Emergency Medicine | Admitting: Emergency Medicine

## 2019-02-26 DIAGNOSIS — E039 Hypothyroidism, unspecified: Secondary | ICD-10-CM | POA: Insufficient documentation

## 2019-02-26 DIAGNOSIS — I129 Hypertensive chronic kidney disease with stage 1 through stage 4 chronic kidney disease, or unspecified chronic kidney disease: Secondary | ICD-10-CM | POA: Diagnosis not present

## 2019-02-26 DIAGNOSIS — Z7984 Long term (current) use of oral hypoglycemic drugs: Secondary | ICD-10-CM | POA: Diagnosis not present

## 2019-02-26 DIAGNOSIS — N183 Chronic kidney disease, stage 3 (moderate): Secondary | ICD-10-CM | POA: Insufficient documentation

## 2019-02-26 DIAGNOSIS — T783XXA Angioneurotic edema, initial encounter: Secondary | ICD-10-CM | POA: Diagnosis not present

## 2019-02-26 DIAGNOSIS — B20 Human immunodeficiency virus [HIV] disease: Secondary | ICD-10-CM | POA: Diagnosis not present

## 2019-02-26 DIAGNOSIS — T7840XA Allergy, unspecified, initial encounter: Secondary | ICD-10-CM | POA: Diagnosis not present

## 2019-02-26 DIAGNOSIS — Z859 Personal history of malignant neoplasm, unspecified: Secondary | ICD-10-CM | POA: Insufficient documentation

## 2019-02-26 DIAGNOSIS — L299 Pruritus, unspecified: Secondary | ICD-10-CM | POA: Diagnosis not present

## 2019-02-26 DIAGNOSIS — E1122 Type 2 diabetes mellitus with diabetic chronic kidney disease: Secondary | ICD-10-CM | POA: Insufficient documentation

## 2019-02-26 DIAGNOSIS — Z79899 Other long term (current) drug therapy: Secondary | ICD-10-CM | POA: Insufficient documentation

## 2019-02-26 DIAGNOSIS — R600 Localized edema: Secondary | ICD-10-CM | POA: Diagnosis not present

## 2019-02-26 MED ORDER — PREDNISONE 20 MG PO TABS: 40.0000 mg | ORAL_TABLET | Freq: Every day | ORAL | 0 refills | Status: DC

## 2019-02-26 MED ORDER — METHYLPREDNISOLONE SODIUM SUCC 125 MG IJ SOLR
125.0000 mg | Freq: Once | INTRAMUSCULAR | Status: AC
  Administered 2019-02-26: 02:00:00 125 mg via INTRAVENOUS
  Filled 2019-02-26: qty 2

## 2019-02-26 MED ORDER — FAMOTIDINE IN NACL 20-0.9 MG/50ML-% IV SOLN
20.0000 mg | Freq: Once | INTRAVENOUS | Status: AC
  Administered 2019-02-26: 02:00:00 20 mg via INTRAVENOUS
  Filled 2019-02-26: qty 50

## 2019-02-26 MED ORDER — DIPHENHYDRAMINE HCL 50 MG/ML IJ SOLN
50.0000 mg | Freq: Once | INTRAMUSCULAR | Status: AC
  Administered 2019-02-26: 02:00:00 50 mg via INTRAVENOUS
  Filled 2019-02-26: qty 1

## 2019-02-26 NOTE — Discharge Instructions (Signed)
Take prednisone as prescribed until finished.  Watch your sugars closely to ensure that they do not go too high.  Continue your other daily prescribed medications in addition to Benadryl as needed for persistent itching or rash.  Follow with your primary care doctor.  You may return for new or concerning symptoms.

## 2019-02-26 NOTE — ED Provider Notes (Signed)
[facility] DEPT Provider Note   CSN: [id number] Arrival date & time: 02/26/19  0058    History   Chief Complaint Chief Complaint  Patient presents with  . Allergic Reaction    HPI Steven [name] is a 38 y.o. male.     38 year old male with a history of diabetes, HIV (CD4 75), hypertension, asthma presents to the emergency department for allergic reaction.  He began to notice some periorbital swelling around 1800 tonight.  Took a Benadryl at this time with repeat dosing at 2100 for persistent symptoms.  Began to feel that his facial angioedema was worsening, prompting him to come to the ED.  He did not use his EpiPen at home.  Had some food from [employer] which may have been cross contaminated, but is unsure of any new food ingestions.  No new soaps, lotions, detergents.  Denies any difficulty breathing, difficulty swallowing, lip or tongue swelling.  He does have some itching to his bilateral hands and feet characteristic of prior allergic reactions.  The history is provided by the patient. No language interpreter was used.  Allergic Reaction   Past Medical History:  Diagnosis Date  . Asthma   . Cancer ([place])   . Colitis 05/27/2011  . Diabetes mellitus without complication ([place])   . HIV positive ([place]) 03/23/09   Genotype [id number]  . HTN (hypertension)   . Kaposi's sarcoma   . Syphilis 03/23/09-   1:2    Patient Active Problem List   Diagnosis Date Noted  . Encounter for annual general medical examination with abnormal findings in adult 08/10/2018  . Asthma, severe persistent, well-controlled 06/15/2018  . Allergic reaction 06/15/2018  . Food allergy 06/15/2018  . Seasonal and perennial allergic rhinoconjunctivitis 06/15/2018  . AIDS ([place]) 02/09/2018  . Gastroesophageal reflux disease 03/28/2017  . Type 2 diabetes mellitus ([place]) 03/25/2015  . CKD (chronic kidney disease) stage 3, GFR 30-59 ml/min (HCC) 03/23/2015  . Thrush, oral  03/23/2015  . Essential hypertension 11/24/2014  . 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 Arthralgia of elbow, right 07/29/2013  . 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 Lymphedema 04/10/2009  . SYPHILIS 04/06/2009    Past Surgical History:  Procedure Laterality Date  . BRAIN SURGERY    . IR GENERIC HISTORICAL  02/12/2016   IR REMOVAL TUN ACCESS W/ PORT W/O FL MOD SED 02/12/2016 [name], MD [facility]-INTERV RAD        Home Medications    Prior to Admission medications   Medication Sig Start Date End Date Taking? Authorizing Provider  abacavir-dolutegravir-lamiVUDine (TRIUMEQ) 600-50-300 MG tablet Take 1 tablet by mouth daily. 02/07/19  Yes Campbell Riches, MD  albuterol (VENTOLIN HFA) 108 (90 Base) MCG/ACT inhaler INHALE 2 PUFFS BY MOUTH EVERY 4 TO 6 HOURS AS NEEDED FOR COUGH OR WHEEZING Patient taking differently: Inhale 2 puffs into the lungs every 4 (four) hours as needed for wheezing or shortness of breath.  12/10/18  Yes Garnet Sierras, DO  amLODipine (NORVASC) 10 MG tablet Take 1 tablet (10 mg total) by mouth daily. For blood pressure. 02/08/19  Yes Pleas [name], NP  esomeprazole (NEXIUM) 40 MG capsule TAKE ONE CAPSULE BY MOUTH ONCE DAILY AS DIRECTED Patient taking differently: Take 40 mg by mouth daily.  02/06/17  Yes Kozlow, Donnamarie Poag, MD  fexofenadine (ALLEGRA) 180 MG tablet Take 1 tablet (180 mg total) by mouth daily. 05/03/12  Yes Philipp Deputy  C, MD  fluticasone (FLONASE) 50 MCG/ACT nasal spray SHAKE LIQUID AND USE 2 SPRAYS IN EACH NOSTRIL DAILY Patient taking differently: Place 2 sprays into both nostrils daily.  08/08/18  Yes Padgett, Rae Halsted, MD  fluticasone-salmeterol (ADVAIR HFA) [id number] MCG/ACT inhaler USE 2 INHALATIONS BY MOUTH TWICE DAILY Patient taking differently: Inhale 2 puffs into the lungs 2 (two) times daily.  08/08/18  Yes Padgett, Rae Halsted, MD  hydrochlorothiazide  (HYDRODIURIL) 25 MG tablet Take 1 tablet by mouth once daily for blood pressure. 02/08/19  Yes Pleas [name], NP  levothyroxine (SYNTHROID) 150 MCG tablet TAKE 1 TABLET BY MOUTH DAILY WITH AN EXTRA 1/2 TABLET ONCE WEEKLY Patient taking differently: Take 150 mcg by mouth daily before breakfast.  12/31/18  Yes Elayne Snare, MD  metFORMIN (GLUCOPHAGE-XR) 500 MG 24 hr tablet Take 2 tablets in the morning and 1 tablet in the evening for diabetes. 08/10/18  Yes Pleas [name], NP  montelukast (SINGULAIR) 10 MG tablet TAKE 1 TABLET(10 MG) BY MOUTH DAILY Patient taking differently: Take 10 mg by mouth at bedtime.  02/25/19  Yes Garnet Sierras, DO  Multiple Vitamin (MULTIVITAMIN) tablet Take 1 tablet by mouth daily.   Yes [provider]  potassium chloride SA (K-DUR) 20 MEQ tablet Take 1 tablet (20 mEq total) by mouth daily. 02/08/19  Yes Pleas [name], NP  XOLAIR 150 MG injection INJECT 300 MG UNDER THE SKIN EVERY TWO WEEKS Patient taking differently: Inject 300 mg into the skin every 14 (fourteen) days.  01/16/19  Yes Padgett, Rae Halsted, MD  clotrimazole (MYCELEX) 10 MG troche Take 1 tablet (10 mg total) by mouth 5 (five) times daily. Patient not taking: Reported on 02/26/2019 10/03/18   Pleas [name], NP  FLOVENT HFA 110 MCG/ACT inhaler INHALE 2 PUFFS INTO THE LUNGS TWICE DAILY Patient not taking: No sig reported 11/06/18   Garnet Sierras, DO  guaiFENesin (MUCINEX) 600 MG 12 hr tablet Take 1 tablet (600 mg total) by mouth 2 (two) times daily. Patient not taking: Reported on 02/26/2019 07/16/18   Loura Halt A, NP  LANCETS MICRO THIN 33G MISC USE TO TEST BLOOD SUGAR ONCE A DAY 08/21/17   Jegede, Olugbemiga E, MD  nystatin (MYCOSTATIN) 100000 UNIT/ML suspension 5 ml by mouth 4 times daily. swish and swallow or spit Patient not taking: Reported on 02/26/2019 06/14/18   Garnet Sierras, DO  ONE TOUCH ULTRA TEST test strip TEST BLOOD GLUCOSE THREE TIMES DAILY AS DIRECTED 10/12/18   Pleas [name],  NP  PAZEO 0.7 % SOLN Place 1 drop into both eyes daily. Patient not taking: Reported on 02/26/2019 02/08/18   Kennith Gain, MD  predniSONE (DELTASONE) 20 MG tablet Take 2 tablets (40 mg total) by mouth daily. Take 40 mg by mouth daily for 3 days, then 20mg  by mouth daily for 3 days, then 10mg  daily for 3 days 02/26/19   Antonietta Breach, PA-C    Family History Family History  Problem Relation Age of Onset  . Pancreatic cancer Mother   . Cancer Mother   . Diabetes Paternal Grandmother   . Thyroid disease Paternal Grandmother     Social History Social History   Tobacco Use  . Smoking status: Never Smoker  . Smokeless tobacco: Never Used  Substance Use Topics  . Alcohol use: Yes    Alcohol/week: 0.0 standard drinks    Comment: socially - less now  . Drug use: No     Allergies  Lisinopril, Penicillins, Levaquin [levofloxacin in d5w], Tessalon [benzonatate], and Aspirin   Review of Systems Review of Systems Ten systems reviewed and are negative for acute change, except as noted in the HPI.    Physical Exam Updated Vital Signs BP (!) 157/93 (BP Location: Left Arm)   Pulse 85   Temp 98 F (36.7 C) (Oral)   Resp 18   Ht 6' 1.5" (1.867 m)   Wt 127 kg   SpO2 98%   BMI 36.44 kg/m   Physical Exam Vitals signs and nursing note reviewed.  Constitutional:      General: He is not in acute distress.    Appearance: He is well-developed. He is not diaphoretic.     Comments: Nontoxic appearing and in NAD  HENT:     Head: Normocephalic and atraumatic.     Mouth/Throat:     Comments: No lip or tongue swelling. Tolerating secretions. Normal phonation. Eyes:     General: No scleral icterus.    Conjunctiva/sclera: Conjunctivae normal.     Comments: Mild periorbital angioedema.  Some fullness to bilateral cheeks.  Neck:     Musculoskeletal: Normal range of motion.  Cardiovascular:     Rate and Rhythm: Normal rate and regular rhythm.     Pulses: Normal pulses.   Pulmonary:     Effort: Pulmonary effort is normal. No respiratory distress.     Comments: Respirations even and unlabored. Musculoskeletal: Normal range of motion.  Skin:    General: Skin is warm and dry.     Coloration: Skin is not pale.     Findings: No erythema or rash.  Neurological:     Mental Status: He is alert and oriented to person, place, and time.  Psychiatric:        Mood and Affect: Mood is anxious (mild).        Behavior: Behavior normal.      ED Treatments / Results  Labs (all labs ordered are listed, but only abnormal results are displayed) Labs Reviewed - No data to display  EKG None  Radiology No results found.  Procedures Procedures (including critical care time)  Medications Ordered in ED Medications  diphenhydrAMINE (BENADRYL) injection 50 mg (50 mg Intravenous Given 02/26/19 0228)  methylPREDNISolone sodium succinate (SOLU-MEDROL) 125 mg/2 mL injection 125 mg (125 mg Intravenous Given 02/26/19 0228)  famotidine (PEPCID) IVPB 20 mg premix (0 mg Intravenous Stopped 02/26/19 0324)    4:26 AM Patient reassessed.  He states that he is feeling better.  No difficulty breathing or swallowing.  Denies lip or tongue swelling.  His lungs are clear to auscultation.  No hypoxia and tolerating secretions.  Plan for discharge on prednisone taper.  Patient expresses comfort with plan.   Initial Impression / Assessment and Plan / ED Course  I have reviewed the triage vital signs and the nursing notes.  Pertinent labs & imaging results that were available during my care of the patient were reviewed by me and considered in my medical decision making (see chart for details).        38 year old male presenting for allergic reaction due to unknown inciting factor.  Has a history of allergies and just saw his allergist for follow-up yesterday.  He did not use his EpiPen prior to arrival.  Was managed with IV Benadryl, Solu-Medrol, Pepcid.  Observed for approximately 3  hours without clinical decompensation.  He is feeling better following supportive measures and is presently comfortable for discharge.  We will continue on prednisone  taper.  I have encouraged follow-up with his primary doctor.  Declines additional prescriptions for an EpiPen at this time.  Return precautions discussed and provided. Patient discharged in stable condition with no unaddressed concerns.   Final Clinical Impressions(s) / ED Diagnoses   Final diagnoses:  Allergic reaction, initial encounter    ED Discharge Orders         Ordered    predniSONE (DELTASONE) 20 MG tablet  Daily     02/26/19 0423           Antonietta Breach, PA-C 02/26/19 [place], [place], DO 02/26/19 [phone]

## 2019-02-26 NOTE — ED Triage Notes (Signed)
Patient here from home with complaints of allergic reaction to unknown. Denies eating anything different or coming into contact with anything different. Bilateral eye swelling, facial swelling, and bilateral hand itching noted. Hx of same.

## 2019-03-14 ENCOUNTER — Ambulatory Visit: Payer: PPO

## 2019-04-12 ENCOUNTER — Ambulatory Visit (INDEPENDENT_AMBULATORY_CARE_PROVIDER_SITE_OTHER): Payer: PPO

## 2019-04-12 DIAGNOSIS — Z23 Encounter for immunization: Secondary | ICD-10-CM

## 2019-04-23 ENCOUNTER — Other Ambulatory Visit: Payer: Self-pay | Admitting: Primary Care

## 2019-04-23 DIAGNOSIS — E119 Type 2 diabetes mellitus without complications: Secondary | ICD-10-CM

## 2019-04-23 MED ORDER — METFORMIN HCL ER 500 MG PO TB24: ORAL_TABLET | ORAL | 0 refills | Status: DC

## 2019-05-13 ENCOUNTER — Ambulatory Visit: Payer: Self-pay

## 2019-05-29 ENCOUNTER — Telehealth: Payer: Self-pay | Admitting: Allergy

## 2019-05-29 ENCOUNTER — Ambulatory Visit: Payer: PPO | Admitting: Allergy

## 2019-05-29 NOTE — Telephone Encounter (Signed)
Patient called and would like to restart getting his Xolair injections. His last shot was on 01/17/2019. Patient would like to get his first shot on Monday, 06/03/2019 if possible, because he has an office visit that day.  Please advise.

## 2019-05-29 NOTE — Telephone Encounter (Signed)
Called and left a message informing patient that I have scheduled his Xolair injection for Monday at 2 pm however he can get it at time of his office visit.

## 2019-05-29 NOTE — Progress Notes (Deleted)
Follow Up Note  RE: Steven [name] MRN: [id number] DOB: [birth year] Date of Office Visit: 05/29/2019  Referring provider: Pleas [name], NP Primary care provider: Pleas [name], NP  Chief Complaint: No chief complaint on file.  History of Present Illness: I had the pleasure of seeing Steven [name] for a follow up visit at the [facility] on 05/29/2019. He is a 38 y.o. male, who is being followed for asthma, allergic rhinoconjunctivitis, food allergies and GERD. Today he is here for regular follow up visit. His previous allergy office visit was on 02/25/2019 with Dr. Maudie Mercury.   Asthma, severe persistent, well-controlled Well-controlled with below regimen.  Today's spirometry was unremarkable.   Daily controller medication(s):continue Advair 230 2 puffs twice a day with spacer and rinse mouth afterwards ? Continue Xolair 300mg  every 14 days   Prior to physical activity:May use albuterol rescue inhaler 2 puffs 5 to 15 minutes prior to strenuous physical activities.  Rescue medications:May use albuterol rescue inhaler 2 puffs or nebulizer every 4 to 6 hours as needed for shortness of breath, chest tightness, coughing, and wheezing. Monitor frequency of use.   During upper respiratory infections: Start Qvar 80 2 puffs twice a day for 1-2 weeks.  Seasonal and perennial allergic rhinoconjunctivitis Past history - 2015 skin testing was positive to grass, ragweed, weed, mold, dust mites, cat and dog. Interim history - well controlled.   Take Flonase 1-2 sprays daily as needed for nasal congestion.   May use over the counter antihistamines such as Zyrtec (cetirizine), Claritin (loratadine), Allegra (fexofenadine), or Xyzal (levocetirizine) daily as needed.  May usepazeoeye drops daily as needed for itchy eyes.  Continue environmental control measures.   Food allergy Past history -  2019 immunocap was slightly positive: alpha gal, wheat, corn, peanut,  soy, egg, shellfish. Patient tolerates all of the above foods with no issues except for the shellfish.  Interim history - No accidental ingestion.   Continue to avoid shellfish and mushroom.  For mild symptoms you can take over the counter antihistamines such as Benadryl and monitor symptoms closely. If symptoms worsen or if you have severe symptoms including breathing issues, throat closure, significant swelling, whole body hives, severe diarrhea and vomiting, lightheadedness then inject epinephrine and seek immediate medical care afterwards.  Gastroesophageal reflux disease Well-controlled.  Continue medication - Nexium 40mg  daily.   Continue lifestyle modification diet.   02/26/2019 ER HPI: "38 year old male with a history of diabetes, HIV (CD4 62), hypertension, asthma presents to the emergency department for allergic reaction.  He began to notice some periorbital swelling around 1800 tonight.  Took a Benadryl at this time with repeat dosing at 2100 for persistent symptoms.  Began to feel that his facial angioedema was worsening, prompting him to come to the ED.  He did not use his EpiPen at home.  Had some food from [employer] which may have been cross contaminated, but is unsure of any new food ingestions.  No new soaps, lotions, detergents.  Denies any difficulty breathing, difficulty swallowing, lip or tongue swelling.  He does have some itching to his bilateral hands and feet characteristic of prior allergic reactions."  Assessment and Plan: Steven [name] is a 38 y.o. male with: No problem-specific Assessment & Plan notes found for this encounter.  No follow-ups on file.  No orders of the defined types were placed in this encounter.  Lab Orders  No laboratory test(s) ordered today    Diagnostics: Spirometry:  Tracings reviewed. His  effort: {Blank single:19197::"Good reproducible efforts.","It was hard to get consistent efforts and there is a question as to whether this reflects a  maximal maneuver.","Poor effort, data can not be interpreted."} FVC: ***L FEV1: ***L, ***% predicted FEV1/FVC ratio: ***% Interpretation: {Blank single:19197::"Spirometry consistent with mild obstructive disease","Spirometry consistent with moderate obstructive disease","Spirometry consistent with severe obstructive disease","Spirometry consistent with possible restrictive disease","Spirometry consistent with mixed obstructive and restrictive disease","Spirometry uninterpretable due to technique","Spirometry consistent with normal pattern","No overt abnormalities noted given today's efforts"}.  Please see scanned spirometry results for details.  Skin Testing: {Blank single:19197::"Select foods","Environmental allergy panel","Environmental allergy panel and select foods","Food allergy panel","None","Deferred due to recent antihistamines use"}. Positive test to: ***. Negative test to: ***.  Results discussed with patient/family.   Medication List:  Current Outpatient Medications  Medication Sig Dispense Refill  . abacavir-dolutegravir-lamiVUDine (TRIUMEQ) 600-50-300 MG tablet Take 1 tablet by mouth daily. 90 tablet 1  . albuterol (VENTOLIN HFA) 108 (90 Base) MCG/ACT inhaler INHALE 2 PUFFS BY MOUTH EVERY 4 TO 6 HOURS AS NEEDED FOR COUGH OR WHEEZING (Patient taking differently: Inhale 2 puffs into the lungs every 4 (four) hours as needed for wheezing or shortness of breath. ) 42.5 g 1  . amLODipine (NORVASC) 10 MG tablet Take 1 tablet (10 mg total) by mouth daily. For blood pressure. 90 tablet 3  . clotrimazole (MYCELEX) 10 MG troche Take 1 tablet (10 mg total) by mouth 5 (five) times daily. (Patient not taking: Reported on 02/26/2019) 35 tablet 0  . esomeprazole (NEXIUM) 40 MG capsule TAKE ONE CAPSULE BY MOUTH ONCE DAILY AS DIRECTED (Patient taking differently: Take 40 mg by mouth daily. ) 30 capsule 0  . fexofenadine (ALLEGRA) 180 MG tablet Take 1 tablet (180 mg total) by mouth daily. 15 tablet 0   . FLOVENT HFA 110 MCG/ACT inhaler INHALE 2 PUFFS INTO THE LUNGS TWICE DAILY (Patient not taking: No sig reported) 12 g 2  . fluticasone (FLONASE) 50 MCG/ACT nasal spray SHAKE LIQUID AND USE 2 SPRAYS IN EACH NOSTRIL DAILY (Patient taking differently: Place 2 sprays into both nostrils daily. ) 16 g 5  . fluticasone-salmeterol (ADVAIR HFA) 230-21 MCG/ACT inhaler USE 2 INHALATIONS BY MOUTH TWICE DAILY (Patient taking differently: Inhale 2 puffs into the lungs 2 (two) times daily. ) 12 g 5  . guaiFENesin (MUCINEX) 600 MG 12 hr tablet Take 1 tablet (600 mg total) by mouth 2 (two) times daily. (Patient not taking: Reported on 02/26/2019) 15 tablet 0  . hydrochlorothiazide (HYDRODIURIL) 25 MG tablet Take 1 tablet by mouth once daily for blood pressure. 90 tablet 3  . LANCETS MICRO THIN 33G MISC USE TO TEST BLOOD SUGAR ONCE A DAY 100 each 2  . levothyroxine (SYNTHROID) 150 MCG tablet TAKE 1 TABLET BY MOUTH DAILY WITH AN EXTRA 1/2 TABLET ONCE WEEKLY (Patient taking differently: Take 150 mcg by mouth daily before breakfast. ) 96 tablet 2  . metFORMIN (GLUCOPHAGE-XR) 500 MG 24 hr tablet Take 2 tablets in the morning and 1 tablet in the evening for diabetes. 270 tablet 0  . montelukast (SINGULAIR) 10 MG tablet TAKE 1 TABLET(10 MG) BY MOUTH DAILY (Patient taking differently: Take 10 mg by mouth at bedtime. ) 90 tablet 2  . Multiple Vitamin (MULTIVITAMIN) tablet Take 1 tablet by mouth daily.    Marland Kitchen nystatin (MYCOSTATIN) 100000 UNIT/ML suspension 5 ml by mouth 4 times daily. swish and swallow or spit (Patient not taking: Reported on 02/26/2019) 110 mL 0  . ONE TOUCH ULTRA TEST test strip TEST  BLOOD GLUCOSE THREE TIMES DAILY AS DIRECTED 100 each 2  . PAZEO 0.7 % SOLN Place 1 drop into both eyes daily. (Patient not taking: Reported on 02/26/2019) 1 Bottle 5  . potassium chloride SA (K-DUR) 20 MEQ tablet Take 1 tablet (20 mEq total) by mouth daily. 90 tablet 3  . predniSONE (DELTASONE) 20 MG tablet Take 2 tablets (40 mg  total) by mouth daily. Take 40 mg by mouth daily for 3 days, then 20mg  by mouth daily for 3 days, then 10mg  daily for 3 days 12 tablet 0  . XOLAIR 150 MG injection INJECT 300 MG UNDER THE SKIN EVERY TWO WEEKS (Patient taking differently: Inject 300 mg into the skin every 14 (fourteen) days. ) 4 each 11   Current Facility-Administered Medications  Medication Dose Route Frequency Provider Last Rate Last Dose  . omalizumab Arvid Right) injection 300 mg  300 mg Subcutaneous Q14 Days Jiles Prows, MD   300 mg at 02/25/19 1603   Allergies: Allergies  Allergen Reactions  . Lisinopril Swelling    Swelling of lower lip while on lisinopril  . Penicillins Hives and Swelling    REACTION: rash and swelling  . Levaquin [Levofloxacin In D5w] Rash  . Tessalon [Benzonatate] Rash  . Aspirin Nausea Only   I reviewed his past medical history, social history, family history, and environmental history and no significant changes have been reported from his previous visit.  Review of Systems  Constitutional: Negative for appetite change, chills, fever and unexpected weight change.  HENT: Negative for congestion and rhinorrhea.   Eyes: Negative for itching.  Respiratory: Negative for cough, chest tightness, shortness of breath and wheezing.   Gastrointestinal: Negative for abdominal pain.  Skin: Negative for rash.  Allergic/Immunologic: Positive for environmental allergies and food allergies.  Neurological: Negative for headaches.   Objective: There were no vitals taken for this visit. There is no height or weight on file to calculate BMI. Physical Exam  Constitutional: He is oriented to person, place, and time. He appears well-developed and well-nourished.  HENT:  Head: Normocephalic and atraumatic.  Right Ear: External ear normal.  Left Ear: External ear normal.  Nose: Nose normal.  Mouth/Throat: Oropharynx is clear and moist.  Eyes: Conjunctivae and EOM are normal.  Neck: Neck supple.   Cardiovascular: Normal rate, regular rhythm and normal heart sounds. Exam reveals no gallop and no friction rub.  No murmur heard. Pulmonary/Chest: Effort normal and breath sounds normal. He has no wheezes. He has no rales.  Neurological: He is alert and oriented to person, place, and time.  Skin: Skin is warm. No rash noted.  Psychiatric: He has a normal mood and affect. His behavior is normal.  Nursing note and vitals reviewed.  Previous notes and tests were reviewed. The plan was reviewed with the patient/family, and all questions/concerned were addressed.  It was my pleasure to see Consuelo today and participate in his care. Please feel free to contact me with any questions or concerns.  Sincerely,  Rexene Alberts, DO Allergy & Immunology  [facility] office: [phone] [facility] office: [place] office: [phone]

## 2019-06-03 ENCOUNTER — Telehealth: Payer: Self-pay | Admitting: *Deleted

## 2019-06-03 ENCOUNTER — Ambulatory Visit: Payer: Self-pay

## 2019-06-03 ENCOUNTER — Other Ambulatory Visit: Payer: Self-pay

## 2019-06-03 ENCOUNTER — Ambulatory Visit (INDEPENDENT_AMBULATORY_CARE_PROVIDER_SITE_OTHER): Payer: PPO | Admitting: Allergy

## 2019-06-03 ENCOUNTER — Encounter: Payer: Self-pay | Admitting: Allergy

## 2019-06-03 VITALS — BP 140/90 | HR 78 | Temp 96.6°F | Resp 16 | Ht 73.0 in | Wt 288.8 lb

## 2019-06-03 DIAGNOSIS — Z91018 Allergy to other foods: Secondary | ICD-10-CM | POA: Diagnosis not present

## 2019-06-03 DIAGNOSIS — K219 Gastro-esophageal reflux disease without esophagitis: Secondary | ICD-10-CM | POA: Diagnosis not present

## 2019-06-03 DIAGNOSIS — J4550 Severe persistent asthma, uncomplicated: Secondary | ICD-10-CM | POA: Diagnosis not present

## 2019-06-03 DIAGNOSIS — J302 Other seasonal allergic rhinitis: Secondary | ICD-10-CM | POA: Diagnosis not present

## 2019-06-03 DIAGNOSIS — J3089 Other allergic rhinitis: Secondary | ICD-10-CM

## 2019-06-03 DIAGNOSIS — H1010 Acute atopic conjunctivitis, unspecified eye: Secondary | ICD-10-CM

## 2019-06-03 MED ORDER — MONTELUKAST SODIUM 10 MG PO TABS: 10.0000 mg | ORAL_TABLET | Freq: Every day | ORAL | 2 refills | Status: DC

## 2019-06-03 NOTE — Assessment & Plan Note (Addendum)
Well-controlled with below regimen with no flares. Patient wants to continue with Xolair as it's been helping him.   Daily controller medication(s):continue Advair 230 2 puffs twice a day with spacer and rinse mouth afterwards ? Continue Xolair 300mg  every 14 days. Xolair injection given today.  ? Continue montelukast 10mg  daily.   Prior to physical activity:May use albuterol rescue inhaler 2 puffs 5 to 15 minutes prior to strenuous physical activities.  Rescue medications:May use albuterol rescue inhaler 2 puffs or nebulizer every 4 to 6 hours as needed for shortness of breath, chest tightness, coughing, and wheezing. Monitor frequency of use.   During upper respiratory infections: Start Qvar 80 2 puffs twice a day for 1-2 weeks.  Will get spirometry at next visit instead of today due to COVID-19 pandemic and trying to minimize any type of aerosolizing procedures at this time in the office.

## 2019-06-03 NOTE — Assessment & Plan Note (Signed)
Past history - 2015 skin testing was positive to grass, ragweed, weed, mold, dust mites, cat and dog. Interim history - stable.   Take Flonase 1-2 sprays daily as needed for nasal congestion.   May use over the counter antihistamines such as Zyrtec (cetirizine), Claritin (loratadine), Allegra (fexofenadine), or Xyzal (levocetirizine) daily as needed.  May usePazeoeye drops daily as needed for itchy eyes.  Continue environmental control measures.

## 2019-06-03 NOTE — Progress Notes (Signed)
Follow Up Note  RE: Steven [name] MRN: [id number] DOB: [birth year] Date of Office Visit: 06/03/2019  Referring provider: Pleas [name], NP Primary care provider: Pleas [name], NP  Chief Complaint: Asthma (no issues, no concerns today) and Allergic Rhinitis   History of Present Illness: I had the pleasure of seeing Steven [name] for a follow up visit at the [facility] on 06/03/2019. He is a 38 y.o. male, who is being followed for asthma, allergic rhinoconjunctivitis, food allergies and GERD. Today he is here for regular follow up visit. His previous allergy office visit was on 02/25/2019 with Dr. Maudie Mercury.   Asthma Only used albuterol 3 times since the last visit - mainly when outdoors with overexertion. Albuterol does help these situations. Still on Advair 230 2 puffs twice a day with spacer and rinse mouth afterwards  Doing well with Xolair with no reactions. He has been feeling better with the Xolair and would like to continue. Had issues with finding a schedule as he has been working from home since the pandemic.   Otherwise denies any SOB, coughing, wheezing, chest tightness, nocturnal awakenings, ER/urgent care visits or prednisone use since the last visit. Has not had to use Qvar.  Up to date with flu vaccine.   Seasonal and perennial allergic rhinoconjunctivitis Takes Flonase and eye drops as needed with good benefit. Takes allegra 180mg  daily.  Food allergy Avoiding shellfish and mushroom. Patient had an accidental cross contamination in August after eating at [employer].   He started to have periorbital and tongue angioedema a few hours after dinner. He took benadryl which didn't help and then he injected himself with epi then went to the ER. He felt much better with the medications. No other reactions since then.  Gastroesophageal reflux disease Well-controlled with Nexium 40mg  daily.    02/26/2019 ER HPI: "38 year old male with a  history of diabetes, HIV (CD4 39), hypertension, asthma presents to the emergency department for allergic reaction.  He began to notice some periorbital swelling around 1800 tonight.  Took a Benadryl at this time with repeat dosing at 2100 for persistent symptoms.  Began to feel that his facial angioedema was worsening, prompting him to come to the ED.  He did not use his EpiPen at home.  Had some food from [employer] which may have been cross contaminated, but is unsure of any new food ingestions.  No new soaps, lotions, detergents.  Denies any difficulty breathing, difficulty swallowing, lip or tongue swelling.  He does have some itching to his bilateral hands and feet characteristic of prior allergic reactions."  Assessment and Plan: Steven [name] is a 38 y.o. male with: Asthma, severe persistent, well-controlled Well-controlled with below regimen with no flares. Patient wants to continue with Xolair as it's been helping him.   Daily controller medication(s):continue Advair 230 2 puffs twice a day with spacer and rinse mouth afterwards ? Continue Xolair 300mg  every 14 days. Xolair injection given today.  ? Continue montelukast 10mg  daily.   Prior to physical activity:May use albuterol rescue inhaler 2 puffs 5 to 15 minutes prior to strenuous physical activities.  Rescue medications:May use albuterol rescue inhaler 2 puffs or nebulizer every 4 to 6 hours as needed for shortness of breath, chest tightness, coughing, and wheezing. Monitor frequency of use.   During upper respiratory infections: Start Qvar 80 2 puffs twice a day for 1-2 weeks.  Will get spirometry at next visit instead of today due to COVID-19 pandemic  and trying to minimize any type of aerosolizing procedures at this time in the office.   Seasonal and perennial allergic rhinoconjunctivitis Past history - 2015 skin testing was positive to grass, ragweed, weed, mold, dust mites, cat and dog. Interim history - stable.   Take Flonase  1-2 sprays daily as needed for nasal congestion.   May use over the counter antihistamines such as Zyrtec (cetirizine), Claritin (loratadine), Allegra (fexofenadine), or Xyzal (levocetirizine) daily as needed.  May usePazeoeye drops daily as needed for itchy eyes.  Continue environmental control measures.   Food allergy Past history -  2019 immunocap was slightly positive: alpha gal, wheat, corn, peanut, soy, egg, shellfish. Patient tolerates all of the above foods with no issues except for the shellfish.  Interim history - Accidental ingestion in August requiring IM epi and ER visit. Patient not sure what he ate but this occurred after eating at [employer].   Continue to avoid shellfish and mushroom.  For mild symptoms you can take over the counter antihistamines such as Benadryl and monitor symptoms closely. If symptoms worsen or if you have severe symptoms including breathing issues, throat closure, significant swelling, whole body hives, severe diarrhea and vomiting, lightheadedness then inject epinephrine and seek immediate medical care afterwards.  Gastroesophageal reflux disease Well-controlled.  Continue Nexium 40mg  daily.   Continue heartburn lifestyle modification diet.   Return in about 3 months (around 09/03/2019).  Meds ordered this encounter  Medications  . montelukast (SINGULAIR) 10 MG tablet    Sig: Take 1 tablet (10 mg total) by mouth at bedtime.    Dispense:  90 tablet    Refill:  2   Diagnostics: None.   Medication List:  Current Outpatient Medications  Medication Sig Dispense Refill  . abacavir-dolutegravir-lamiVUDine (TRIUMEQ) 600-50-300 MG tablet Take 1 tablet by mouth daily. 90 tablet 1  . albuterol (VENTOLIN HFA) 108 (90 Base) MCG/ACT inhaler INHALE 2 PUFFS BY MOUTH EVERY 4 TO 6 HOURS AS NEEDED FOR COUGH OR WHEEZING (Patient taking differently: Inhale 2 puffs into the lungs every 4 (four) hours as needed for wheezing or shortness of breath. ) 42.5 g  1  . amLODipine (NORVASC) 10 MG tablet Take 1 tablet (10 mg total) by mouth daily. For blood pressure. 90 tablet 3  . clotrimazole (MYCELEX) 10 MG troche Take 1 tablet (10 mg total) by mouth 5 (five) times daily. 35 tablet 0  . esomeprazole (NEXIUM) 40 MG capsule TAKE ONE CAPSULE BY MOUTH ONCE DAILY AS DIRECTED (Patient taking differently: Take 40 mg by mouth daily. ) 30 capsule 0  . fexofenadine (ALLEGRA) 180 MG tablet Take 1 tablet (180 mg total) by mouth daily. 15 tablet 0  . FLOVENT HFA 110 MCG/ACT inhaler INHALE 2 PUFFS INTO THE LUNGS TWICE DAILY 12 g 2  . fluticasone (FLONASE) 50 MCG/ACT nasal spray SHAKE LIQUID AND USE 2 SPRAYS IN EACH NOSTRIL DAILY 16 g 5  . fluticasone-salmeterol (ADVAIR HFA) 230-21 MCG/ACT inhaler USE 2 INHALATIONS BY MOUTH TWICE DAILY (Patient taking differently: Inhale 2 puffs into the lungs 2 (two) times daily. ) 12 g 5  . guaiFENesin (MUCINEX) 600 MG 12 hr tablet Take 1 tablet (600 mg total) by mouth 2 (two) times daily. 15 tablet 0  . hydrochlorothiazide (HYDRODIURIL) 25 MG tablet Take 1 tablet by mouth once daily for blood pressure. 90 tablet 3  . LANCETS MICRO THIN 33G MISC USE TO TEST BLOOD SUGAR ONCE A DAY 100 each 2  . levothyroxine (SYNTHROID) 150 MCG tablet  TAKE 1 TABLET BY MOUTH DAILY WITH AN EXTRA 1/2 TABLET ONCE WEEKLY (Patient taking differently: Take 150 mcg by mouth daily before breakfast. ) 96 tablet 2  . metFORMIN (GLUCOPHAGE-XR) 500 MG 24 hr tablet Take 2 tablets in the morning and 1 tablet in the evening for diabetes. 270 tablet 0  . Multiple Vitamin (MULTIVITAMIN) tablet Take 1 tablet by mouth daily.    . ONE TOUCH ULTRA TEST test strip TEST BLOOD GLUCOSE THREE TIMES DAILY AS DIRECTED 100 each 2  . PAZEO 0.7 % SOLN Place 1 drop into both eyes daily. 1 Bottle 5  . potassium chloride SA (K-DUR) 20 MEQ tablet Take 1 tablet (20 mEq total) by mouth daily. 90 tablet 3  . XOLAIR 150 MG injection INJECT 300 MG UNDER THE SKIN EVERY TWO WEEKS (Patient taking  differently: Inject 300 mg into the skin every 14 (fourteen) days. ) 4 each 11  . montelukast (SINGULAIR) 10 MG tablet Take 1 tablet (10 mg total) by mouth at bedtime. 90 tablet 2   Current Facility-Administered Medications  Medication Dose Route Frequency Provider Last Rate Last Dose  . omalizumab Arvid Right) injection 300 mg  300 mg Subcutaneous Q14 Days Jiles Prows, MD   300 mg at 06/03/19 1116   Allergies: Allergies  Allergen Reactions  . Lisinopril Swelling    Swelling of lower lip while on lisinopril  . Penicillins Hives and Swelling    REACTION: rash and swelling  . Levaquin [Levofloxacin In D5w] Rash  . Tessalon [Benzonatate] Rash  . Aspirin Nausea Only   I reviewed his past medical history, social history, family history, and environmental history and no significant changes have been reported from his previous visit.  Review of Systems  Constitutional: Negative for appetite change, chills, fever and unexpected weight change.  HENT: Negative for congestion and rhinorrhea.   Eyes: Negative for itching.  Respiratory: Negative for cough, chest tightness, shortness of breath and wheezing.   Gastrointestinal: Negative for abdominal pain.  Skin: Negative for rash.  Allergic/Immunologic: Positive for environmental allergies and food allergies.  Neurological: Negative for headaches.   Objective: BP 140/90 (BP Location: Left Arm, Patient Position: Sitting, Cuff Size: Large)   Pulse 78   Temp (!) 96.6 F (35.9 C) (Temporal)   Resp 16   Ht 6\' 1"  (1.854 m)   Wt 288 lb 12.8 oz (131 kg)   SpO2 97%   BMI 38.10 kg/m  Body mass index is 38.1 kg/m. Physical Exam  Constitutional: He is oriented to person, place, and time. He appears well-developed and well-nourished.  HENT:  Head: Normocephalic and atraumatic.  Right Ear: External ear normal.  Left Ear: External ear normal.  Nose: Nose normal.  Mouth/Throat: Oropharynx is clear and moist.  Eyes: Conjunctivae and EOM are  normal.  Neck: Neck supple.  Cardiovascular: Normal rate, regular rhythm and normal heart sounds. Exam reveals no gallop and no friction rub.  No murmur heard. Pulmonary/Chest: Effort normal and breath sounds normal. He has no wheezes. He has no rales.  Neurological: He is alert and oriented to person, place, and time.  Skin: Skin is warm. No rash noted.  Psychiatric: He has a normal mood and affect. His behavior is normal.  Nursing note and vitals reviewed.  Previous notes and tests were reviewed. The plan was reviewed with the patient/family, and all questions/concerned were addressed.  It was my pleasure to see Steven [name] today and participate in his care. Please feel free to contact me with any  questions or concerns.  Sincerely,  Rexene Alberts, DO Allergy & Immunology  [facility] office: [phone] [facility] office: [place] office: [phone]

## 2019-06-03 NOTE — Telephone Encounter (Signed)
Patient re-started his Xolair today at 300mg  every 2 weeks. Patient had a dose here. Patient has been scheduled for his next injection in 2 weeks.

## 2019-06-03 NOTE — Patient Instructions (Addendum)
Asthma, severe persistent, well-controlled  Daily controller medication(s):continue Advair 230 2 puffs twice a day with spacer and rinse mouth afterwards ? Continue Xolair 300mg  every 14 days  ? Continue montelukast 10mg  daily.   Prior to physical activity:May use albuterol rescue inhaler 2 puffs 5 to 15 minutes prior to strenuous physical activities.  Rescue medications:May use albuterol rescue inhaler 2 puffs or nebulizer every 4 to 6 hours as needed for shortness of breath, chest tightness, coughing, and wheezing. Monitor frequency of use.   During upper respiratory infections: Start Qvar 80 2 puffs twice a day for 1-2 weeks. Asthma control goals:  Full participation in all desired activities (may need albuterol before activity) Albuterol use two times or less a week on average (not counting use with activity) Cough interfering with sleep two times or less a month Oral steroids no more than once a year No hospitalizations  Seasonal and perennial allergic rhinoconjunctivitis Past history - 2015 skin testing was positive to grass, ragweed, weed, mold, dust mites, cat and dog.  Take Flonase 1-2 sprays daily as needed for nasal congestion.   May use over the counter antihistamines such as Zyrtec (cetirizine), Claritin (loratadine), Allegra (fexofenadine), or Xyzal (levocetirizine) daily as needed.  May usepazeoeye drops daily as needed for itchy eyes.  Continue environmental control measures.   Food allergy  Continue to avoid shellfish and mushroom.  For mild symptoms you can take over the counter antihistamines such as Benadryl and monitor symptoms closely. If symptoms worsen or if you have severe symptoms including breathing issues, throat closure, significant swelling, whole body hives, severe diarrhea and vomiting, lightheadedness then inject epinephrine and seek immediate medical care afterwards.  Gastroesophageal reflux disease Well-controlled.  Continue medication  - Nexium 40mg  daily.   Continue lifestyle modification diet.   Follow up in 3 months or sooner if needed.

## 2019-06-03 NOTE — Telephone Encounter (Signed)
Dr. Maudie Mercury has spoken with Steven [name] and he has it worked out with his job to be off every other Monday to get his Xolair injection so that he can be consistent.

## 2019-06-03 NOTE — Assessment & Plan Note (Signed)
Past history -  2019 immunocap was slightly positive: alpha gal, wheat, corn, peanut, soy, egg, shellfish. Patient tolerates all of the above foods with no issues except for the shellfish.  Interim history - Accidental ingestion in August requiring IM epi and ER visit. Patient not sure what he ate but this occurred after eating at [employer].   Continue to avoid shellfish and mushroom.  For mild symptoms you can take over the counter antihistamines such as Benadryl and monitor symptoms closely. If symptoms worsen or if you have severe symptoms including breathing issues, throat closure, significant swelling, whole body hives, severe diarrhea and vomiting, lightheadedness then inject epinephrine and seek immediate medical care afterwards.

## 2019-06-03 NOTE — Assessment & Plan Note (Signed)
Well-controlled.  Continue Nexium 40mg  daily.   Continue heartburn lifestyle modification diet.

## 2019-06-12 ENCOUNTER — Telehealth: Payer: Self-pay | Admitting: Oncology

## 2019-06-12 NOTE — Telephone Encounter (Signed)
Returned patient's phone call regarding 12/03 appointment, it will be a phone visit.

## 2019-06-13 ENCOUNTER — Inpatient Hospital Stay: Payer: PPO | Admitting: Oncology

## 2019-06-13 ENCOUNTER — Telehealth: Payer: Self-pay | Admitting: Oncology

## 2019-06-13 NOTE — Telephone Encounter (Signed)
Returned phone call regarding rescheduling an appointment, rescheduled cancelled 12/03 appointment to 12/07. Virtual visit.

## 2019-06-17 ENCOUNTER — Inpatient Hospital Stay: Payer: PPO | Attending: Oncology | Admitting: Oncology

## 2019-06-17 ENCOUNTER — Other Ambulatory Visit: Payer: Self-pay

## 2019-06-17 ENCOUNTER — Ambulatory Visit (INDEPENDENT_AMBULATORY_CARE_PROVIDER_SITE_OTHER): Payer: PPO

## 2019-06-17 ENCOUNTER — Telehealth: Payer: Self-pay | Admitting: *Deleted

## 2019-06-17 DIAGNOSIS — J4550 Severe persistent asthma, uncomplicated: Secondary | ICD-10-CM

## 2019-06-17 DIAGNOSIS — C469 Kaposi's sarcoma, unspecified: Secondary | ICD-10-CM | POA: Diagnosis not present

## 2019-06-17 NOTE — Progress Notes (Signed)
[facility] OFFICE VISIT PROGRESS NOTE  I connected with Steven [name] on 06/17/19 at  8:30 AM EST by telephone and verified that I am speaking with the correct person using two identifiers.   I discussed the limitations, risks, security and privacy concerns of performing an evaluation and management service by telemedicine and the availability of in-person appointments. I also discussed with the patient that there may be a patient responsible charge related to this service. The patient expressed understanding and agreed to proceed.    Patient's location: Home Provider's location: Office   Diagnosis: Kaposi's sarcoma  INTERVAL HISTORY:   Steven [name] is seen today for a telehealth visit.  This is secondary to distancing with the Covid pandemic.  The visit started as a video visit and was converted to a telephone visit secondary to connection difficulty.  He reports feeling well.  No new or enlarging skin lesions.  No pain.  Good appetite and energy level.  No complaint.  He continues follow-up with Dr. Johnnye Sima and an allergist.     Lab Results:  Lab Results  Component Value Date   WBC 5.0 01/08/2019   HGB 16.5 01/08/2019   HCT 50.7 (H) 01/08/2019   MCV 81.1 01/08/2019   PLT 251 01/08/2019   NEUTROABS 2.7 06/14/2018    Imaging:  No results found.  Medications: I have reviewed the patient's current medications.  Assessment/Plan: 1. 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   Status post Doxil (5 cycles 04/29/2009 through 09/22/2009 and then 3 cycles of paclitaxel 12/01/2009 through 02/09/2010.   Treatment subsequently placed on hold due to bilateral subdural hematomas.   CT scans 10/28/2013 showed a new prevascular mass with PET scan showing mild low level  FDG uptake in the anterior mediastinal mass with more pronounced FDG accumulation within the dominant mesenteric lymph node.   Restaging PET scan 03/13/2014 showed a decrease in low level FDG uptake associated with the prevascular mass; similar degree of FDG uptake associated with small mesenteric lymph nodes; persistent increased FDG uptake associated with the diffuse lytic bone metastasis. CT scan also 03/13/2014 showed interval decrease in the size of the anterior mediastinal mass, unchanged multiple small bowel mesenteric lymph nodes and re-demonstrated widespread lytic osseous metastasis. 2. HIV/AIDS followed by Dr. Johnnye Sima. He continues Abacavir-Dolutegravir-Lamivud.  3. History of bilateral subdural hematomas status post evacuation 03/05/2010. 4. Hypertension. 5. Renal dysfunction. 6. Asthma. 7. History of hyperthyroidism status post radioactive iodine November 2012 with subsequent hypothyroidism now on Synthroid. 8. Port-A-Cath placement 10/14/2009. 9. Lower extremity edema, bilateral, question lymphedema. He has been evaluated at the lymphedema clinic. 10. Skin lesions. Status post evaluation by dermatology.   Disposition: Steven [name] remains in clinical remission from the Kaposi's sarcoma.  He continues follow-up in the infectious disease clinic with Dr. Johnnye Sima.  He will call for new symptoms or new skin lesions.  Steven [name] will be scheduled for a 1 year office visit.     I discussed the assessment and treatment plan with the patient. The patient was provided an opportunity to ask questions and all were answered. The patient agreed with the plan and demonstrated an understanding of the instructions.   The patient was advised to call back or seek an in-person evaluation if the symptoms worsen or if the condition fails to improve as anticipated.  I provided  20 minutes of telephone, chart review, and documentation during this encounter, and > 50% was spent counseling as  documented under my assessment & plan.  Betsy Coder ANP/GNP-BC   06/17/2019 8:02 AM

## 2019-06-17 NOTE — Telephone Encounter (Signed)
Per Dr. Benay Spice, telephone call to [facility] to obtain copies of path report on new skin lesions. Per office patient has not had biopsy since 2016. No new path reports in the chart.  Telephone call returned to patient- he states he did not have new lesions removed. The dermatologist advised him, he was not concerned about them.

## 2019-06-19 ENCOUNTER — Other Ambulatory Visit: Payer: Self-pay

## 2019-07-01 ENCOUNTER — Ambulatory Visit (INDEPENDENT_AMBULATORY_CARE_PROVIDER_SITE_OTHER): Payer: PPO

## 2019-07-01 ENCOUNTER — Other Ambulatory Visit: Payer: Self-pay

## 2019-07-01 DIAGNOSIS — J4550 Severe persistent asthma, uncomplicated: Secondary | ICD-10-CM

## 2019-07-18 ENCOUNTER — Ambulatory Visit: Payer: Self-pay

## 2019-07-22 ENCOUNTER — Ambulatory Visit: Payer: Self-pay

## 2019-07-29 ENCOUNTER — Other Ambulatory Visit: Payer: Self-pay

## 2019-07-29 ENCOUNTER — Ambulatory Visit (INDEPENDENT_AMBULATORY_CARE_PROVIDER_SITE_OTHER): Payer: PPO

## 2019-07-29 DIAGNOSIS — J4550 Severe persistent asthma, uncomplicated: Secondary | ICD-10-CM

## 2019-08-01 ENCOUNTER — Other Ambulatory Visit: Payer: Self-pay | Admitting: Infectious Diseases

## 2019-08-01 DIAGNOSIS — B20 Human immunodeficiency virus [HIV] disease: Secondary | ICD-10-CM

## 2019-08-12 ENCOUNTER — Ambulatory Visit: Payer: Self-pay

## 2019-08-14 ENCOUNTER — Ambulatory Visit: Payer: PPO

## 2019-08-14 ENCOUNTER — Other Ambulatory Visit: Payer: Self-pay

## 2019-09-04 ENCOUNTER — Encounter: Payer: Self-pay | Admitting: Infectious Diseases

## 2019-09-04 ENCOUNTER — Encounter: Payer: Self-pay | Admitting: Allergy

## 2019-09-04 ENCOUNTER — Other Ambulatory Visit: Payer: Self-pay

## 2019-09-04 ENCOUNTER — Ambulatory Visit (INDEPENDENT_AMBULATORY_CARE_PROVIDER_SITE_OTHER): Payer: PPO | Admitting: Allergy

## 2019-09-04 VITALS — BP 142/98 | HR 88 | Temp 97.6°F | Resp 18 | Ht 73.5 in | Wt 295.8 lb

## 2019-09-04 DIAGNOSIS — J4550 Severe persistent asthma, uncomplicated: Secondary | ICD-10-CM

## 2019-09-04 DIAGNOSIS — K219 Gastro-esophageal reflux disease without esophagitis: Secondary | ICD-10-CM

## 2019-09-04 DIAGNOSIS — J302 Other seasonal allergic rhinitis: Secondary | ICD-10-CM | POA: Diagnosis not present

## 2019-09-04 DIAGNOSIS — H1010 Acute atopic conjunctivitis, unspecified eye: Secondary | ICD-10-CM

## 2019-09-04 DIAGNOSIS — Z91018 Allergy to other foods: Secondary | ICD-10-CM | POA: Diagnosis not present

## 2019-09-04 DIAGNOSIS — J3089 Other allergic rhinitis: Secondary | ICD-10-CM

## 2019-09-04 MED ORDER — EPINEPHRINE 0.3 MG/0.3ML IJ SOAJ: 0.3000 mg | INTRAMUSCULAR | 2 refills | Status: DC | PRN

## 2019-09-04 NOTE — Patient Instructions (Addendum)
Asthma  Daily controller medication(s):continue Advair 230 2 puffs twice a day with spacer and rinse mouth afterwards ? Continue Xolair 300mg  every 14 days. Xolair injection given today.  ? Continue montelukast 10mg  daily.   Prior to physical activity:May use albuterol rescue inhaler 2 puffs 5 to 15 minutes prior to strenuous physical activities.  Rescue medications:May use albuterol rescue inhaler 2 puffs or nebulizer every 4 to 6 hours as needed for shortness of breath, chest tightness, coughing, and wheezing. Monitor frequency of use.   During upper respiratory infections: Start Qvar 80 2 puffs twice a day for 1-2 weeks. Asthma control goals:  Full participation in all desired activities (may need albuterol before activity) Albuterol use two times or less a week on average (not counting use with activity) Cough interfering with sleep two times or less a month Oral steroids no more than once a year No hospitalizations  Seasonal and perennial allergic rhinoconjunctivitis Past history - 2015 skin testing was positive to grass, ragweed, weed, mold, dust mites, cat and dog.  Take Flonase 1-2 sprays per nostril daily as needed for nasal congestion.   May use over the counter antihistamines such as Zyrtec (cetirizine), Claritin (loratadine), Allegra (fexofenadine), or Xyzal (levocetirizine) daily as needed.  May usePazeo 1 drop in each eye daily as needed for itchy eyes.  Continue environmental control measures.  Food allergy  Continue to avoid shellfish and mushroom.  For mild symptoms you can take over the counter antihistamines such as Benadryl and monitor symptoms closely. If symptoms worsen or if you have severe symptoms including breathing issues, throat closure, significant swelling, whole body hives, severe diarrhea and vomiting, lightheadedness then inject epinephrine and seek immediate medical care afterwards.  Gastroesophageal reflux disease  Continue Nexium 40mg   daily.   Continue heartburn lifestyle modification diet.   Follow up in 4 months or sooner if needed.

## 2019-09-04 NOTE — Progress Notes (Signed)
Follow Up Note  RE: Steven [name] MRN: [id number] DOB: Nov 05, 1980 Date of Office Visit: 09/04/2019  Referring provider: Pleas [name], NP Primary care provider: Pleas [name], NP  Chief Complaint: Asthma  History of Present Illness: I had the pleasure of seeing Steven [name] for a follow up visit at the [facility] on 09/04/2019. He is a 39 y.o. male, who is being followed for asthma, allergic rhinoconjunctivitis, food allergy and GERD. His previous allergy office visit was on 06/03/2019 with Dr. Maudie Mercury. Today is a regular follow up visit.  Asthma Denies any SOB, coughing, wheezing, chest tightness, nocturnal awakenings, ER/urgent care visits or prednisone use since the last visit. No albuterol use the last 3 weeks.  Currently on Advair 230 2 puffs BID and montelukast daily. Xolair 300mg  every 2 weeks with no issues.  Working from home.   Seasonal and perennial allergic rhinoconjunctivitis Using Flonase and eye drops only if needed. Takes Claritin daily.  Symptoms well controlled.   Food allergy Avoiding shellfish and mushrooms. No reactions.  Gastroesophageal reflux disease Taking Nexium daily with good benefit.   Assessment and Plan: Steven [name] is a 39 y.o. male with: Asthma, severe persistent, well-controlled Well-controlled with below regimen with no flares.   ACT score 24.  Today's spirometry showed moderate restriction.   Daily controller medication(s):continue Advair 230 2 puffs twice a day with spacer and rinse mouth afterwards ? Continue Xolair 300mg  every 14 days. Xolair injection given today.  ? Continue montelukast 10mg  daily.   Prior to physical activity:May use albuterol rescue inhaler 2 puffs 5 to 15 minutes prior to strenuous physical activities.  Rescue medications:May use albuterol rescue inhaler 2 puffs or nebulizer every 4 to 6 hours as needed for shortness of breath, chest tightness, coughing, and wheezing. Monitor  frequency of use.   During upper respiratory infections: Start Qvar 80 2 puffs twice a day for 1-2 weeks.  Seasonal and perennial allergic rhinoconjunctivitis Past history - 2015 skin testing was positive to grass, ragweed, weed, mold, dust mites, cat and dog. Interim history - stable with daily Claritin use.   Take Flonase 1-2 sprays per nostril daily as needed for nasal congestion.   May use over the counter antihistamines such as Zyrtec (cetirizine), Claritin (loratadine), Allegra (fexofenadine), or Xyzal (levocetirizine) daily as needed.  May usePazeo 1 drop in each eye daily as needed for itchy eyes.  Continue environmental control measures.  Food allergy Past history -  2019 immunocap was slightly positive: alpha gal, wheat, corn, peanut, soy, egg, shellfish. Patient tolerates all of the above foods with no issues except for the shellfish. Reaction in August 2020 requiring IM Epi and ER visit.  Interim history - No additional reactions.   Continue to avoid shellfish and mushroom.  For mild symptoms you can take over the counter antihistamines such as Benadryl and monitor symptoms closely. If symptoms worsen or if you have severe symptoms including breathing issues, throat closure, significant swelling, whole body hives, severe diarrhea and vomiting, lightheadedness then inject epinephrine and seek immediate medical care afterwards.  Gastroesophageal reflux disease Well-controlled.  Continue Nexium 40mg  daily.   Continue heartburn lifestyle modification diet.   Return in about 4 months (around 01/02/2020).  Meds ordered this encounter  Medications  . EPINEPHrine 0.3 mg/0.3 mL IJ SOAJ injection    Sig: Inject 0.3 mLs (0.3 mg total) into the muscle as needed for anaphylaxis.    Dispense:  1 each    Refill:  2  Diagnostics: Spirometry:  Tracings reviewed. His effort: Good reproducible efforts. FVC: 3.20 L FEV1: 2.29 L, 56% predicted FEV1/FVC ratio:  72% Interpretation: Spirometry consistent with possible restrictive disease.  Please see scanned spirometry results for details.  Medication List:  Current Outpatient Medications  Medication Sig Dispense Refill  . albuterol (VENTOLIN HFA) 108 (90 Base) MCG/ACT inhaler INHALE 2 PUFFS BY MOUTH EVERY 4 TO 6 HOURS AS NEEDED FOR COUGH OR WHEEZING (Patient taking differently: Inhale 2 puffs into the lungs every 4 (four) hours as needed for wheezing or shortness of breath. ) 42.5 g 1  . amLODipine (NORVASC) 10 MG tablet Take 1 tablet (10 mg total) by mouth daily. For blood pressure. 90 tablet 3  . clotrimazole (MYCELEX) 10 MG troche Take 1 tablet (10 mg total) by mouth 5 (five) times daily. 35 tablet 0  . esomeprazole (NEXIUM) 40 MG capsule TAKE ONE CAPSULE BY MOUTH ONCE DAILY AS DIRECTED (Patient taking differently: Take 40 mg by mouth daily. ) 30 capsule 0  . fexofenadine (ALLEGRA) 180 MG tablet Take 1 tablet (180 mg total) by mouth daily. 15 tablet 0  . FLOVENT HFA 110 MCG/ACT inhaler INHALE 2 PUFFS INTO THE LUNGS TWICE DAILY 12 g 2  . fluticasone (FLONASE) 50 MCG/ACT nasal spray SHAKE LIQUID AND USE 2 SPRAYS IN EACH NOSTRIL DAILY 16 g 5  . fluticasone-salmeterol (ADVAIR HFA) 230-21 MCG/ACT inhaler USE 2 INHALATIONS BY MOUTH TWICE DAILY 12 g 5  . guaiFENesin (MUCINEX) 600 MG 12 hr tablet Take 1 tablet (600 mg total) by mouth 2 (two) times daily. 15 tablet 0  . hydrochlorothiazide (HYDRODIURIL) 25 MG tablet Take 1 tablet by mouth once daily for blood pressure. 90 tablet 3  . LANCETS MICRO THIN 33G MISC USE TO TEST BLOOD SUGAR ONCE A DAY 100 each 2  . levothyroxine (SYNTHROID) 150 MCG tablet TAKE 1 TABLET BY MOUTH DAILY WITH AN EXTRA 1/2 TABLET ONCE WEEKLY (Patient taking differently: Take 150 mcg by mouth daily before breakfast. ) 96 tablet 2  . metFORMIN (GLUCOPHAGE-XR) 500 MG 24 hr tablet Take 2 tablets in the morning and 1 tablet in the evening for diabetes. 270 tablet 0  . montelukast (SINGULAIR)  10 MG tablet Take 1 tablet (10 mg total) by mouth at bedtime. 90 tablet 2  . Multiple Vitamin (MULTIVITAMIN) tablet Take 1 tablet by mouth daily.    . ONE TOUCH ULTRA TEST test strip TEST BLOOD GLUCOSE THREE TIMES DAILY AS DIRECTED 100 each 2  . PAZEO 0.7 % SOLN Place 1 drop into both eyes daily. 1 Bottle 5  . potassium chloride SA (K-DUR) 20 MEQ tablet Take 1 tablet (20 mEq total) by mouth daily. 90 tablet 3  . TRIUMEQ 600-50-300 MG tablet TAKE 1 TABLET BY MOUTH DAILY 90 tablet 1  . XOLAIR 150 MG injection INJECT 300 MG UNDER THE SKIN EVERY TWO WEEKS (Patient taking differently: Inject 300 mg into the skin every 14 (fourteen) days. ) 4 each 11  . EPINEPHrine 0.3 mg/0.3 mL IJ SOAJ injection Inject 0.3 mLs (0.3 mg total) into the muscle as needed for anaphylaxis. 1 each 2   Current Facility-Administered Medications  Medication Dose Route Frequency Provider Last Rate Last Admin  . omalizumab Arvid Right) injection 300 mg  300 mg Subcutaneous Q14 Days Jiles Prows, MD   300 mg at 09/04/19 1131   Allergies: Allergies  Allergen Reactions  . Lisinopril Swelling    Swelling of lower lip while on lisinopril  . Penicillins Hives and  Swelling    REACTION: rash and swelling  . Levaquin [Levofloxacin In D5w] Rash  . Tessalon [Benzonatate] Rash  . Aspirin Nausea Only   I reviewed his past medical history, social history, family history, and environmental history and no significant changes have been reported from his previous visit.  Review of Systems  Constitutional: Negative for appetite change, chills, fever and unexpected weight change.  HENT: Negative for congestion and rhinorrhea.   Eyes: Negative for itching.  Respiratory: Negative for cough, chest tightness, shortness of breath and wheezing.   Gastrointestinal: Negative for abdominal pain.  Skin: Negative for rash.  Allergic/Immunologic: Positive for environmental allergies and food allergies.  Neurological: Negative for headaches.    Objective: BP (!) 142/98 (BP Location: Right Arm, Patient Position: Sitting, Cuff Size: Large) Comment: Dr. Maudie Mercury notified/Pt did not take BP med this am/BF  Pulse 88   Temp 97.6 F (36.4 C)   Resp 18   Ht 6' 1.5" (1.867 m)   Wt 295 lb 12.8 oz (134.2 kg)   SpO2 96%   BMI 38.50 kg/m  Body mass index is 38.5 kg/m. Physical Exam  Constitutional: He is oriented to person, place, and time. He appears well-developed and well-nourished.  HENT:  Head: Normocephalic and atraumatic.  Right Ear: External ear normal.  Left Ear: External ear normal.  Nose: Nose normal.  Mouth/Throat: Oropharynx is clear and moist.  Eyes: Conjunctivae and EOM are normal.  Cardiovascular: Normal rate, regular rhythm and normal heart sounds. Exam reveals no gallop and no friction rub.  No murmur heard. Pulmonary/Chest: Effort normal and breath sounds normal. He has no wheezes. He has no rales.  Musculoskeletal:     Cervical back: Neck supple.  Neurological: He is alert and oriented to person, place, and time.  Skin: Skin is warm. No rash noted.  Psychiatric: He has a normal mood and affect. His behavior is normal.  Nursing note and vitals reviewed.  Previous notes and tests were reviewed. The plan was reviewed with the patient/family, and all questions/concerned were addressed.  It was my pleasure to see Steven [name] today and participate in his care. Please feel free to contact me with any questions or concerns.  Sincerely,  Rexene Alberts, DO Allergy & Immunology  [facility] office: [phone] [facility] office: [place] office: [phone]

## 2019-09-04 NOTE — Assessment & Plan Note (Signed)
Well-controlled with below regimen with no flares.   ACT score 24.  Today's spirometry showed moderate restriction.   Daily controller medication(s):continue Advair 230 2 puffs twice a day with spacer and rinse mouth afterwards ? Continue Xolair 300mg  every 14 days. Xolair injection given today.  ? Continue montelukast 10mg  daily.   Prior to physical activity:May use albuterol rescue inhaler 2 puffs 5 to 15 minutes prior to strenuous physical activities.  Rescue medications:May use albuterol rescue inhaler 2 puffs or nebulizer every 4 to 6 hours as needed for shortness of breath, chest tightness, coughing, and wheezing. Monitor frequency of use.   During upper respiratory infections: Start Qvar 80 2 puffs twice a day for 1-2 weeks.

## 2019-09-04 NOTE — Assessment & Plan Note (Signed)
Well-controlled.  Continue Nexium 40mg  daily.   Continue heartburn lifestyle modification diet.

## 2019-09-04 NOTE — Assessment & Plan Note (Signed)
Past history - 2015 skin testing was positive to grass, ragweed, weed, mold, dust mites, cat and dog. Interim history - stable with daily Claritin use.   Take Flonase 1-2 sprays per nostril daily as needed for nasal congestion.   May use over the counter antihistamines such as Zyrtec (cetirizine), Claritin (loratadine), Allegra (fexofenadine), or Xyzal (levocetirizine) daily as needed.  May usePazeo 1 drop in each eye daily as needed for itchy eyes.  Continue environmental control measures.

## 2019-09-04 NOTE — Assessment & Plan Note (Signed)
Past history -  2019 immunocap was slightly positive: alpha gal, wheat, corn, peanut, soy, egg, shellfish. Patient tolerates all of the above foods with no issues except for the shellfish. Reaction in August 2020 requiring IM Epi and ER visit.  Interim history - No additional reactions.   Continue to avoid shellfish and mushroom.  For mild symptoms you can take over the counter antihistamines such as Benadryl and monitor symptoms closely. If symptoms worsen or if you have severe symptoms including breathing issues, throat closure, significant swelling, whole body hives, severe diarrhea and vomiting, lightheadedness then inject epinephrine and seek immediate medical care afterwards.

## 2019-09-17 ENCOUNTER — Other Ambulatory Visit: Payer: Self-pay | Admitting: *Deleted

## 2019-09-17 ENCOUNTER — Other Ambulatory Visit: Payer: Self-pay

## 2019-09-17 MED ORDER — FLUTICASONE PROPIONATE 50 MCG/ACT NA SUSP: NASAL | 5 refills | Status: DC

## 2019-09-17 MED ORDER — ADVAIR HFA 230-21 MCG/ACT IN AERO: INHALATION_SPRAY | RESPIRATORY_TRACT | 5 refills | Status: DC

## 2019-09-18 ENCOUNTER — Other Ambulatory Visit: Payer: Self-pay

## 2019-09-18 ENCOUNTER — Ambulatory Visit (INDEPENDENT_AMBULATORY_CARE_PROVIDER_SITE_OTHER): Payer: PPO

## 2019-09-18 DIAGNOSIS — J4550 Severe persistent asthma, uncomplicated: Secondary | ICD-10-CM

## 2019-09-19 ENCOUNTER — Telehealth: Payer: Self-pay | Admitting: Primary Care

## 2019-09-19 NOTE — Progress Notes (Signed)
  Chronic Care Management   Outreach Note  09/19/2019 Name: Steven [name] MRN: [id number] DOB: 1981/05/08  Referred by: Pleas [name], NP Reason for referral : No chief complaint on file.   An unsuccessful telephone outreach was attempted today. The patient was referred to the pharmacist for assistance with care management and care coordination.   Follow Up Plan:   Raynicia Dukes [employer] Scheduler

## 2019-10-02 ENCOUNTER — Other Ambulatory Visit: Payer: Self-pay

## 2019-10-02 ENCOUNTER — Ambulatory Visit (INDEPENDENT_AMBULATORY_CARE_PROVIDER_SITE_OTHER): Payer: PPO

## 2019-10-02 DIAGNOSIS — J4550 Severe persistent asthma, uncomplicated: Secondary | ICD-10-CM

## 2019-10-16 ENCOUNTER — Ambulatory Visit: Payer: Self-pay

## 2019-10-24 ENCOUNTER — Other Ambulatory Visit: Payer: Self-pay

## 2019-10-24 ENCOUNTER — Ambulatory Visit (INDEPENDENT_AMBULATORY_CARE_PROVIDER_SITE_OTHER): Payer: PPO

## 2019-10-24 ENCOUNTER — Other Ambulatory Visit: Payer: PPO

## 2019-10-24 DIAGNOSIS — J4550 Severe persistent asthma, uncomplicated: Secondary | ICD-10-CM

## 2019-11-07 ENCOUNTER — Ambulatory Visit (INDEPENDENT_AMBULATORY_CARE_PROVIDER_SITE_OTHER): Payer: PPO

## 2019-11-07 ENCOUNTER — Other Ambulatory Visit: Payer: Self-pay

## 2019-11-07 DIAGNOSIS — J4550 Severe persistent asthma, uncomplicated: Secondary | ICD-10-CM | POA: Diagnosis not present

## 2019-11-08 ENCOUNTER — Encounter: Payer: PPO | Admitting: Infectious Diseases

## 2019-11-11 ENCOUNTER — Other Ambulatory Visit: Payer: Self-pay | Admitting: Endocrinology

## 2019-11-12 ENCOUNTER — Encounter: Payer: PPO | Admitting: Infectious Diseases

## 2019-11-18 ENCOUNTER — Telehealth: Payer: Self-pay

## 2019-11-18 NOTE — Telephone Encounter (Signed)
Left patient voice mail to call back to reschedule his appointment with Dr. Johnnye Sima, ok to do Labs same day if patient cant come in for 2 appointments

## 2019-11-19 ENCOUNTER — Other Ambulatory Visit: Payer: PPO

## 2019-11-21 ENCOUNTER — Ambulatory Visit (INDEPENDENT_AMBULATORY_CARE_PROVIDER_SITE_OTHER): Payer: PPO

## 2019-11-21 ENCOUNTER — Other Ambulatory Visit: Payer: Self-pay

## 2019-11-21 DIAGNOSIS — J4550 Severe persistent asthma, uncomplicated: Secondary | ICD-10-CM

## 2019-11-28 ENCOUNTER — Other Ambulatory Visit (INDEPENDENT_AMBULATORY_CARE_PROVIDER_SITE_OTHER): Payer: PPO

## 2019-11-28 ENCOUNTER — Ambulatory Visit: Payer: PPO | Admitting: Endocrinology

## 2019-11-28 ENCOUNTER — Other Ambulatory Visit: Payer: Self-pay

## 2019-11-28 DIAGNOSIS — E890 Postprocedural hypothyroidism: Secondary | ICD-10-CM

## 2019-11-28 LAB — TSH: TSH: 2.61 u[IU]/mL (ref 0.35–4.50)

## 2019-11-28 LAB — T4, FREE: Free T4: 1.32 ng/dL (ref 0.60–1.60)

## 2019-12-05 ENCOUNTER — Other Ambulatory Visit: Payer: Self-pay | Admitting: Allergy

## 2019-12-05 ENCOUNTER — Other Ambulatory Visit: Payer: Self-pay

## 2019-12-06 ENCOUNTER — Ambulatory Visit: Payer: PPO | Admitting: Endocrinology

## 2019-12-06 ENCOUNTER — Ambulatory Visit (INDEPENDENT_AMBULATORY_CARE_PROVIDER_SITE_OTHER): Payer: PPO

## 2019-12-06 ENCOUNTER — Ambulatory Visit (INDEPENDENT_AMBULATORY_CARE_PROVIDER_SITE_OTHER): Payer: PPO | Admitting: Endocrinology

## 2019-12-06 ENCOUNTER — Encounter: Payer: Self-pay | Admitting: Endocrinology

## 2019-12-06 VITALS — BP 114/82 | HR 101 | Ht 73.5 in | Wt 295.2 lb

## 2019-12-06 DIAGNOSIS — E890 Postprocedural hypothyroidism: Secondary | ICD-10-CM | POA: Diagnosis not present

## 2019-12-06 DIAGNOSIS — J4550 Severe persistent asthma, uncomplicated: Secondary | ICD-10-CM

## 2019-12-06 NOTE — Progress Notes (Signed)
Patient ID: Steven [name], male   DOB: May 29, 1981, 39 y.o.   MRN: [id number]     Reason for Appointment:  Hypothyroidism, follow-up visit    History of Present Illness:   HYPOTHYROIDISM  was first diagnosed around 2013  He previously had hyperthyroidism which was treated with radioactive iodine in 05/2011  He had been taking Synthroid 200 mcg daily initially and it was then reduced to 175 mcg Because of persistently low TSH the dose was again reduced in 08/2014 down to 150 g  Since 5/18 He has been taking 7-1/2 tablets a week of the 150 mcg levothyroxine Dose was increased by half tablet weekly when TSH was 4.6  He is back for his annual follow-up He feels fairly good and no reports of fatigue or lethargy No significant weight change  Also no recent problems with his eyes, previously had mild ophthalmopathy  He takes his medication consistently daily on empty stomach, usually an hour before eating Not on any vitamin supplements  TSH is consistently normal  Wt Readings from Last 3 Encounters:  12/06/19 295 lb 3.2 oz (133.9 kg)  09/04/19 295 lb 12.8 oz (134.2 kg)  06/03/19 288 lb 12.8 oz (131 kg)    Labs:  Lab Results  Component Value Date   TSH 2.61 11/28/2019   TSH 1.86 11/21/2018   TSH 2.20 11/24/2017   FREET4 1.32 11/28/2019   FREET4 1.33 11/21/2018   FREET4 1.20 11/24/2017    OTHER active problems including diabetes are discussed in review of systems:    Past Medical History:  Diagnosis Date  . Asthma   . Cancer ([place])   . Colitis 05/27/2011  . Diabetes mellitus without complication ([place])   . 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 HTN (hypertension)   . Kaposi's sarcoma   . Syphilis 03/23/09-   1:2    Past Surgical History:  Procedure Laterality Date  . BRAIN SURGERY    . IR GENERIC HISTORICAL  02/12/2016   IR REMOVAL TUN ACCESS W/ PORT W/O FL MOD SED 02/12/2016 [name], MD [facility]-INTERV RAD    Family History  Problem Relation  Age of Onset  . Pancreatic cancer Mother   . Cancer Mother   . Diabetes Paternal Grandmother   . Thyroid disease Paternal Grandmother     Social History:  reports that he has never smoked. He has never used smokeless tobacco. He reports current alcohol use. He reports that he does not use drugs.  Allergies:  Allergies  Allergen Reactions  . Lisinopril Swelling    Swelling of lower lip while on lisinopril  . Penicillins Hives and Swelling    REACTION: rash and swelling  . Levaquin [Levofloxacin In D5w] Rash  . Tessalon [Benzonatate] Rash  . Aspirin Nausea Only    Allergies as of 12/06/2019      Reactions   Lisinopril Swelling   Swelling of lower lip while on lisinopril   Penicillins Hives, Swelling   REACTION: rash and swelling   Levaquin [levofloxacin In D5w] Rash   Tessalon [benzonatate] Rash   Aspirin Nausea Only      Medication List       Accurate as of Dec 06, 2019  2:02 PM. If you have any questions, ask your nurse or doctor.        Advair HFA 230-21 MCG/ACT inhaler Generic drug: fluticasone-salmeterol USE 2 INHALATIONS BY MOUTH TWICE DAILY   albuterol 108 (90 Base) MCG/ACT inhaler Commonly known as: VENTOLIN  HFA INHALE 2 PUFFS BY MOUTH EVERY 4 TO 6 HOURS AS NEEDED FOR COUGH OR WHEEZING   amLODipine 10 MG tablet Commonly known as: NORVASC Take 1 tablet (10 mg total) by mouth daily. For blood pressure.   clotrimazole 10 MG troche Commonly known as: MYCELEX Take 1 tablet (10 mg total) by mouth 5 (five) times daily.   EPINEPHrine 0.3 mg/0.3 mL Soaj injection Commonly known as: EPI-PEN Inject 0.3 mLs (0.3 mg total) into the muscle as needed for anaphylaxis.   esomeprazole 40 MG capsule Commonly known as: NEXIUM TAKE ONE CAPSULE BY MOUTH ONCE DAILY AS DIRECTED What changed: See the new instructions.   fexofenadine 180 MG tablet Commonly known as: ALLEGRA Take 1 tablet (180 mg total) by mouth daily.   Flovent HFA 110 MCG/ACT inhaler Generic drug:  fluticasone INHALE 2 PUFFS INTO THE LUNGS TWICE DAILY   fluticasone 50 MCG/ACT nasal spray Commonly known as: FLONASE SHAKE LIQUID AND USE 2 SPRAYS IN EACH NOSTRIL DAILY   guaiFENesin 600 MG 12 hr tablet Commonly known as: Mucinex Take 1 tablet (600 mg total) by mouth 2 (two) times daily.   hydrochlorothiazide 25 MG tablet Commonly known as: HYDRODIURIL Take 1 tablet by mouth once daily for blood pressure.   Lancets Micro Thin 33G Misc USE TO TEST BLOOD SUGAR ONCE A DAY   levothyroxine 150 MCG tablet Commonly known as: SYNTHROID TAKE 1 TABLET BY MOUTH DAILY WITH AN EXTRA 1/2 TABLET ONCE WEEKLY   metFORMIN 500 MG 24 hr tablet Commonly known as: GLUCOPHAGE-XR Take 2 tablets in the morning and 1 tablet in the evening for diabetes.   montelukast 10 MG tablet Commonly known as: Singulair Take 1 tablet (10 mg total) by mouth at bedtime.   multivitamin tablet Take 1 tablet by mouth daily.   ONE TOUCH ULTRA TEST test strip Generic drug: glucose blood TEST BLOOD GLUCOSE THREE TIMES DAILY AS DIRECTED   Pazeo 0.7 % Soln Generic drug: Olopatadine HCl Place 1 drop into both eyes daily.   potassium chloride SA 20 MEQ tablet Commonly known as: KLOR-CON Take 1 tablet (20 mEq total) by mouth daily.   Triumeq 600-50-300 MG tablet Generic drug: abacavir-dolutegravir-lamiVUDine TAKE 1 TABLET BY MOUTH DAILY   Xolair 150 MG injection Generic drug: omalizumab INJECT 300 MG UNDER THE SKIN EVERY TWO WEEKS What changed: See the new instructions.       Review of Systems:    DIABETES: He has followed with his PCP, his A1c was better last year but he has not had a follow-up No monitoring at home  Lab Results  Component Value Date   HGBA1C 6.0 (A) 08/10/2018   HGBA1C 7.3 (H) 03/23/2018   HGBA1C 5.7 (H) 09/15/2017   Lab Results  Component Value Date   MICROALBUR 2.6 09/15/2017   LDLCALC 100 (H) 01/08/2019   CREATININE 1.24 01/08/2019      CARDIOLOGY:  history of high  blood pressure treated by PCP     BP Readings from Last 3 Encounters:  12/06/19 114/82  09/04/19 (!) 142/98  06/03/19 140/90        Examination:    BP 114/82 (BP Location: Left Arm, Patient Position: Sitting, Cuff Size: Normal)   Pulse (!) 101   Ht 6' 1.5" (1.867 m)   Wt 295 lb 3.2 oz (133.9 kg)   SpO2 98%   BMI 38.42 kg/m   No prominence of the eyes or swelling Anterior neck exam normal Biceps reflexes appear normal No peripheral edema  Assessment/plan and recommendations:    Hypothyroidism, post radioactive iodine He has been on a stable prescription with 150 mcg levothyroxine since 2018  He takes 7-1/2 tablets a week, no changes made last year Subjectively doing very well He is consistent with taking his Synthroid on empty stomach  TSH has been consistent in the normal range    He will continue the same regimen and follow-up in 05/2021  He has a follow-up scheduled with his PCP and he needs to be evaluated for  diabetes at that time  There are no Patient Instructions on file for this visit.   Elayne Snare 12/06/2019, 2:02 PM    Note: This office note was prepared with Dragon voice recognition system technology. Any transcriptional errors that result from this process are unintentional.

## 2019-12-12 ENCOUNTER — Other Ambulatory Visit: Payer: Self-pay | Admitting: Infectious Diseases

## 2019-12-12 DIAGNOSIS — B20 Human immunodeficiency virus [HIV] disease: Secondary | ICD-10-CM

## 2019-12-13 ENCOUNTER — Other Ambulatory Visit: Payer: PPO

## 2019-12-13 ENCOUNTER — Other Ambulatory Visit: Payer: Self-pay

## 2019-12-13 ENCOUNTER — Encounter: Payer: PPO | Admitting: Infectious Diseases

## 2019-12-13 ENCOUNTER — Other Ambulatory Visit (HOSPITAL_COMMUNITY)
Admission: RE | Admit: 2019-12-13 | Discharge: 2019-12-13 | Disposition: A | Discharge status: Home / Self Care | Payer: PPO | Source: Ambulatory Visit | Attending: Infectious Diseases | Admitting: Infectious Diseases

## 2019-12-13 DIAGNOSIS — Z113 Encounter for screening for infections with a predominantly sexual mode of transmission: Secondary | ICD-10-CM

## 2019-12-13 DIAGNOSIS — Z79899 Other long term (current) drug therapy: Secondary | ICD-10-CM

## 2019-12-13 DIAGNOSIS — B20 Human immunodeficiency virus [HIV] disease: Secondary | ICD-10-CM | POA: Diagnosis not present

## 2019-12-13 LAB — T-HELPER CELL (CD4) - (RCID CLINIC ONLY)
CD4 % Helper T Cell: 17 % — ABNORMAL LOW (ref 33–65)
CD4 T Cell Abs: 349 /uL — ABNORMAL LOW (ref 400–1790)

## 2019-12-13 NOTE — Addendum Note (Signed)
Addended byMeriel Pica F on: 12/13/2019 09:46 AM   Modules accepted: Orders

## 2019-12-16 ENCOUNTER — Other Ambulatory Visit: Payer: Self-pay

## 2019-12-16 DIAGNOSIS — B20 Human immunodeficiency virus [HIV] disease: Secondary | ICD-10-CM

## 2019-12-16 LAB — URINE CYTOLOGY ANCILLARY ONLY
Chlamydia: NEGATIVE
Comment: NEGATIVE
Comment: NORMAL
Neisseria Gonorrhea: NEGATIVE

## 2019-12-17 LAB — LIPID PANEL
Cholesterol: 190 mg/dL (ref <200)
HDL: 40 mg/dL (ref >=40)
LDL Cholesterol (Calc): 121 mg/dL (calc) — ABNORMAL HIGH
Non-HDL Cholesterol (Calc): 150 mg/dL (calc) — ABNORMAL HIGH (ref <130)
Total CHOL/HDL Ratio: 4.8 (calc) (ref <5.0)
Triglycerides: 171 mg/dL — ABNORMAL HIGH (ref <150)

## 2019-12-17 LAB — COMPREHENSIVE METABOLIC PANEL
AG Ratio: 1.5 (calc) (ref 1.0–2.5)
ALT: 39 U/L (ref 9–46)
AST: 25 U/L (ref 10–40)
Albumin: 4.7 g/dL (ref 3.6–5.1)
Alkaline phosphatase (APISO): 55 U/L (ref 36–130)
BUN: 13 mg/dL (ref 7–25)
CO2: 34 mmol/L — ABNORMAL HIGH (ref 20–32)
Calcium: 9.8 mg/dL (ref 8.6–10.3)
Chloride: 93 mmol/L — ABNORMAL LOW (ref 98–110)
Creat: 1.18 mg/dL (ref 0.60–1.35)
Globulin: 3.1 g/dL (calc) (ref 1.9–3.7)
Glucose, Bld: 257 mg/dL — ABNORMAL HIGH (ref 65–99)
Potassium: 3.3 mmol/L — ABNORMAL LOW (ref 3.5–5.3)
Sodium: 137 mmol/L (ref 135–146)
Total Bilirubin: 1.4 mg/dL — ABNORMAL HIGH (ref 0.2–1.2)
Total Protein: 7.8 g/dL (ref 6.1–8.1)

## 2019-12-17 LAB — HIV-1 RNA QUANT-NO REFLEX-BLD
HIV 1 RNA Quant: <20 copies/mL
HIV-1 RNA Quant, Log: <1.3 Log copies/mL

## 2019-12-17 LAB — CBC
HCT: 52.9 % — ABNORMAL HIGH (ref 38.5–50.0)
Hemoglobin: 17.1 g/dL (ref 13.2–17.1)
MCH: 26.6 pg — ABNORMAL LOW (ref 27.0–33.0)
MCHC: 32.3 g/dL (ref 32.0–36.0)
MCV: 82.4 fL (ref 80.0–100.0)
MPV: 12.7 fL — ABNORMAL HIGH (ref 7.5–12.5)
Platelets: 239 10*3/uL (ref 140–400)
RBC: 6.42 10*6/uL — ABNORMAL HIGH (ref 4.20–5.80)
RDW: 13.7 % (ref 11.0–15.0)
WBC: 4.7 10*3/uL (ref 3.8–10.8)

## 2019-12-17 LAB — RPR: RPR Ser Ql: REACTIVE — AB

## 2019-12-17 LAB — FLUORESCENT TREPONEMAL AB(FTA)-IGG-BLD: Fluorescent Treponemal ABS: REACTIVE — AB

## 2019-12-17 LAB — RPR TITER: RPR Titer: 1:1 {titer} — ABNORMAL HIGH

## 2019-12-18 ENCOUNTER — Other Ambulatory Visit: Payer: PPO

## 2019-12-18 ENCOUNTER — Other Ambulatory Visit: Payer: Self-pay

## 2019-12-18 ENCOUNTER — Telehealth: Payer: Self-pay | Admitting: Primary Care

## 2019-12-18 DIAGNOSIS — B20 Human immunodeficiency virus [HIV] disease: Secondary | ICD-10-CM

## 2019-12-18 LAB — BASIC METABOLIC PANEL
BUN: 14 mg/dL (ref 7–25)
CO2: 34 mmol/L — ABNORMAL HIGH (ref 20–32)
Calcium: 9.7 mg/dL (ref 8.6–10.3)
Chloride: 94 mmol/L — ABNORMAL LOW (ref 98–110)
Creat: 1.29 mg/dL (ref 0.60–1.35)
Glucose, Bld: 298 mg/dL — ABNORMAL HIGH (ref 65–99)
Potassium: 3.5 mmol/L (ref 3.5–5.3)
Sodium: 137 mmol/L (ref 135–146)

## 2019-12-18 NOTE — Telephone Encounter (Signed)
It looks like his labs are all updated. Please cancel the lab appointment and tell him that we can discuss recent labs and any concerns during his visit on 06/17.

## 2019-12-18 NOTE — Telephone Encounter (Signed)
Pt informed and lab appointment scheduled.

## 2019-12-18 NOTE — Telephone Encounter (Signed)
Pt need lab order for cpe on 12/26/19. He wants to get labs on Monday 12/23/19

## 2019-12-23 ENCOUNTER — Other Ambulatory Visit: Payer: PPO

## 2019-12-24 ENCOUNTER — Telehealth: Payer: Self-pay

## 2019-12-24 ENCOUNTER — Ambulatory Visit: Payer: PPO

## 2019-12-24 ENCOUNTER — Ambulatory Visit: Payer: Self-pay

## 2019-12-24 NOTE — Telephone Encounter (Signed)
Called patient 3 times trying to complete Medicare visit. Phone kept going straight to voicemail, never rung. Left message notifying patient appointment was cancelled and he can reschedule or complete at his upcomig physical with provider.

## 2019-12-25 ENCOUNTER — Ambulatory Visit: Payer: PPO

## 2019-12-25 ENCOUNTER — Other Ambulatory Visit: Payer: Self-pay | Admitting: Allergy

## 2019-12-26 ENCOUNTER — Encounter: Payer: Self-pay | Admitting: Primary Care

## 2019-12-26 ENCOUNTER — Other Ambulatory Visit: Payer: Self-pay

## 2019-12-26 ENCOUNTER — Ambulatory Visit (INDEPENDENT_AMBULATORY_CARE_PROVIDER_SITE_OTHER): Payer: PPO | Admitting: Primary Care

## 2019-12-26 VITALS — BP 126/82 | HR 62 | Temp 96.8°F | Ht 73.5 in | Wt 294.2 lb

## 2019-12-26 DIAGNOSIS — J302 Other seasonal allergic rhinitis: Secondary | ICD-10-CM | POA: Diagnosis not present

## 2019-12-26 DIAGNOSIS — J4550 Severe persistent asthma, uncomplicated: Secondary | ICD-10-CM | POA: Diagnosis not present

## 2019-12-26 DIAGNOSIS — E119 Type 2 diabetes mellitus without complications: Secondary | ICD-10-CM

## 2019-12-26 DIAGNOSIS — N183 Chronic kidney disease, stage 3 unspecified: Secondary | ICD-10-CM

## 2019-12-26 DIAGNOSIS — I10 Essential (primary) hypertension: Secondary | ICD-10-CM

## 2019-12-26 DIAGNOSIS — B370 Candidal stomatitis: Secondary | ICD-10-CM | POA: Diagnosis not present

## 2019-12-26 DIAGNOSIS — B20 Human immunodeficiency virus [HIV] disease: Secondary | ICD-10-CM

## 2019-12-26 DIAGNOSIS — E890 Postprocedural hypothyroidism: Secondary | ICD-10-CM | POA: Diagnosis not present

## 2019-12-26 DIAGNOSIS — C469 Kaposi's sarcoma, unspecified: Secondary | ICD-10-CM

## 2019-12-26 DIAGNOSIS — K219 Gastro-esophageal reflux disease without esophagitis: Secondary | ICD-10-CM | POA: Diagnosis not present

## 2019-12-26 DIAGNOSIS — H1010 Acute atopic conjunctivitis, unspecified eye: Secondary | ICD-10-CM

## 2019-12-26 DIAGNOSIS — Z0000 Encounter for general adult medical examination without abnormal findings: Secondary | ICD-10-CM | POA: Diagnosis not present

## 2019-12-26 DIAGNOSIS — I890 Lymphedema, not elsewhere classified: Secondary | ICD-10-CM | POA: Diagnosis not present

## 2019-12-26 DIAGNOSIS — J3089 Other allergic rhinitis: Secondary | ICD-10-CM

## 2019-12-26 LAB — MICROALBUMIN / CREATININE URINE RATIO
Creatinine,U: 57.5 mg/dL
Microalb Creat Ratio: 5.4 mg/g (ref 0.0–30.0)
Microalb, Ur: 3.1 mg/dL — ABNORMAL HIGH (ref 0.0–1.9)

## 2019-12-26 LAB — POCT GLYCOSYLATED HEMOGLOBIN (HGB A1C): Hemoglobin A1C: 9 % — AB (ref 4.0–5.6)

## 2019-12-26 MED ORDER — HYDROCHLOROTHIAZIDE 25 MG PO TABS: ORAL_TABLET | ORAL | 3 refills | Status: DC

## 2019-12-26 MED ORDER — AMLODIPINE BESYLATE 10 MG PO TABS: 10.0000 mg | ORAL_TABLET | Freq: Every day | ORAL | 3 refills | Status: DC

## 2019-12-26 MED ORDER — CLOTRIMAZOLE 10 MG MT TROC: 10.0000 mg | Freq: Every day | OROMUCOSAL | 0 refills | Status: DC

## 2019-12-26 MED ORDER — POTASSIUM CHLORIDE CRYS ER 20 MEQ PO TBCR: 20.0000 meq | EXTENDED_RELEASE_TABLET | Freq: Every day | ORAL | 3 refills | Status: DC

## 2019-12-26 MED ORDER — METFORMIN HCL ER 500 MG PO TB24: ORAL_TABLET | ORAL | 1 refills | Status: DC

## 2019-12-26 NOTE — Assessment & Plan Note (Signed)
Following with asthma and allergy center, feels well managed. Continue current regimen.

## 2019-12-26 NOTE — Assessment & Plan Note (Signed)
Following with infectious disease, recent labs reviewed. Continue current regimen.

## 2019-12-26 NOTE — Assessment & Plan Note (Signed)
Following with endocrinology, recent TSH reviewed and stable. Continue levothyroxine 150 mcg.

## 2019-12-26 NOTE — Assessment & Plan Note (Signed)
Compliant to lymphedema boots at times, will be renewing stockings.

## 2019-12-26 NOTE — Progress Notes (Signed)
Subjective:    Patient ID: Steven [name], male    DOB: 07/17/1980, 39 y.o.   MRN: [id number]  HPI  This visit occurred during the SARS-CoV-2 public health emergency.  Safety protocols were in place, including screening questions prior to the visit, additional usage of staff PPE, and extensive cleaning of exam room while observing appropriate contact time as indicated for disinfecting solutions.   Steven [name] is a 39 year old male who presents today for complete physical and general follow up.   Immunizations: -Tetanus: Completed in 2015 -Influenza: Completed last season  -Pneumonia: Completed last in 2019 -Covid-19: Completed series   Diet: He endorses a poor diet, plans on improving soon. He's been snacking. Exercise: He is walking and swimming for exercising  Eye exam: Overdue, he will schedule.  Dental exam: No recent exam.   BP Readings from Last 3 Encounters:  12/26/19 126/82  12/06/19 114/82  09/04/19 (!) 142/98   He is checking his blood sugars twice daily and is getting readings of:  AM fasting: 78-100 1 hour after dinner: 170-200  The 10-year ASCVD risk score Mikey Bussing DC Jr., et al., 2013) is: 9.8%   Values used to calculate the score:     Age: 73 years     Sex: Male     Is Non-Hispanic African American: Yes     Diabetic: Yes     Tobacco smoker: No     Systolic Blood Pressure: 983 mmHg     Is BP treated: Yes     HDL Cholesterol: 40 mg/dL     Total Cholesterol: 190 mg/dL   Review of Systems  Constitutional: Negative for unexpected weight change.  HENT: Negative for rhinorrhea.   Respiratory: Negative for cough.   Cardiovascular: Negative for chest pain.       Intermittent edema   Gastrointestinal: Negative for constipation and diarrhea.  Genitourinary: Negative for difficulty urinating.  Musculoskeletal: Negative for arthralgias and myalgias.  Skin: Negative for rash.  Allergic/Immunologic: Positive for environmental allergies.  Neurological: Negative  for dizziness, numbness and headaches.  Psychiatric/Behavioral: The patient is not nervous/anxious.        Past Medical History:  Diagnosis Date  . Asthma   . Cancer ([place])   . Colitis 05/27/2011  . Diabetes mellitus without complication ([place])   . 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 HTN (hypertension)   . Kaposi's sarcoma   . Syphilis 03/23/09-   1:2     Social History   Socioeconomic History  . Marital status: Single    Spouse name: Not on file  . Number of children: Not on file  . Years of education: Not on file  . Highest education level: Not on file  Occupational History  . Not on file  Tobacco Use  . Smoking status: Never Smoker  . Smokeless tobacco: Never Used  Vaping Use  . Vaping Use: Never used  Substance and Sexual Activity  . Alcohol use: Yes    Alcohol/week: 0.0 standard drinks    Comment: socially - less now  . Drug use: No  . Sexual activity: Not Currently    Partners: Male    Birth control/protection: Condom    Comment: pt. declined condoms  Other Topics Concern  . Not on file  Social History Narrative   Single.    No children.   Works in Therapist, art   Enjoys DJ, traveling.    Social Determinants of Health   Financial Resource Strain:   .  Difficulty of Paying Living Expenses:   Food Insecurity:   . Worried About Charity fundraiser in the Last Year:   . Arboriculturist in the Last Year:   Transportation Needs:   . Film/video editor (Medical):   Marland Kitchen Lack of Transportation (Non-Medical):   Physical Activity:   . Days of Exercise per Week:   . Minutes of Exercise per Session:   Stress:   . Feeling of Stress :   Social Connections:   . Frequency of Communication with Friends and Family:   . Frequency of Social Gatherings with Friends and Family:   . Attends Religious Services:   . Active Member of Clubs or Organizations:   . Attends Archivist Meetings:   Marland Kitchen Marital Status:   Intimate Partner Violence:   .  Fear of Current or Ex-Partner:   . Emotionally Abused:   Marland Kitchen Physically Abused:   . Sexually Abused:     Past Surgical History:  Procedure Laterality Date  . BRAIN SURGERY    . IR GENERIC HISTORICAL  02/12/2016   IR REMOVAL TUN ACCESS W/ PORT W/O FL MOD SED 02/12/2016 [name], MD [facility]-INTER[facility] RAD    Family History  Problem Relation Age of Onset  . Pancreatic cancer Mother   . Cancer Mother   . Diabetes Paternal Grandmother   . Thyroid disease Paternal Grandmother     Allergies  Allergen Reactions  . Lisinopril Swelling    Swelling of lower lip while on lisinopril  . Penicillins Hives and Swelling    REACTION: rash and swelling  . Levaquin [Levofloxacin In D5w] Rash  . Tessalon [Benzonatate] Rash  . Aspirin Nausea Only    Current Outpatient Medications on File Prior to Visit  Medication Sig Dispense Refill  . albuterol (VENTOLIN HFA) 108 (90 Base) MCG/ACT inhaler INHALE 2 PUFFS BY MOUTH EVERY 4 TO 6 HOURS AS NEEDED FOR COUGH OR WHEEZING 42.5 g 0  . EPINEPHrine 0.3 mg/0.3 mL IJ SOAJ injection Inject 0.3 mLs (0.3 mg total) into the muscle as needed for anaphylaxis. 1 each 2  . esomeprazole (NEXIUM) 40 MG capsule TAKE ONE CAPSULE BY MOUTH ONCE DAILY AS DIRECTED (Patient taking differently: Take 40 mg by mouth daily. ) 30 capsule 0  . fexofenadine (ALLEGRA) 180 MG tablet Take 1 tablet (180 mg total) by mouth daily. 15 tablet 0  . FLOVENT HFA 110 MCG/ACT inhaler INHALE 2 PUFFS INTO THE LUNGS TWICE DAILY 12 g 2  . fluticasone (FLONASE) 50 MCG/ACT nasal spray SHAKE LIQUID AND USE 2 SPRAYS IN EACH NOSTRIL DAILY 16 g 5  . fluticasone-salmeterol (ADVAIR HFA) 230-21 MCG/ACT inhaler USE 2 INHALATIONS BY MOUTH TWICE DAILY 12 g 5  . LANCETS MICRO THIN 33G MISC USE TO TEST BLOOD SUGAR ONCE A DAY 100 each 2  . levothyroxine (SYNTHROID) 150 MCG tablet TAKE 1 TABLET BY MOUTH DAILY WITH AN EXTRA 1/2 TABLET ONCE WEEKLY 96 tablet 0  . montelukast (SINGULAIR) 10 MG tablet Take 1 tablet (10 mg total) by  mouth at bedtime. 90 tablet 2  . Multiple Vitamin (MULTIVITAMIN) tablet Take 1 tablet by mouth daily.    . ONE TOUCH ULTRA TEST test strip TEST BLOOD GLUCOSE THREE TIMES DAILY AS DIRECTED 100 each 2  . PAZEO 0.7 % SOLN Place 1 drop into both eyes daily. 1 Bottle 5  . TRIUMEQ 600-50-300 MG tablet TAKE 1 TABLET BY MOUTH DAILY 90 tablet 1  . XOLAIR 150 MG injection INJECT 300  MG UNDER THE SKIN EVERY TWO WEEKS (Patient taking differently: Inject 300 mg into the skin every 14 (fourteen) days. ) 4 each 11   Current Facility-Administered Medications on File Prior to Visit  Medication Dose Route Frequency Provider Last Rate Last Admin  . omalizumab Arvid Right) injection 300 mg  300 mg Subcutaneous Q14 Days Kozlow, Donnamarie Poag, MD   300 mg at 12/06/19 0930    BP 126/82   Pulse 62   Temp (!) 96.8 F (36 C) (Temporal)   Ht 6' 1.5" (1.867 m)   Wt 294 lb 4 oz (133.5 kg)   SpO2 98%   BMI 38.30 kg/m    Objective:   Physical Exam  Constitutional: He is oriented to person, place, and time.  HENT:  Right Ear: Tympanic membrane and ear canal normal.  Left Ear: Tympanic membrane and ear canal normal.  Eyes: Pupils are equal, round, and reactive to light.  Cardiovascular: Normal rate and regular rhythm.  Chronic bilateral lower extremity edema noted, no pitting. Stable.   Respiratory: Effort normal and breath sounds normal.  GI: Soft. Bowel sounds are normal. There is no abdominal tenderness.  Musculoskeletal:        General: Normal range of motion.     Cervical back: Neck supple.  Neurological: He is alert and oriented to person, place, and time. No cranial nerve deficit.  Reflex Scores:      Patellar reflexes are 2+ on the right side and 2+ on the left side. Skin: Skin is warm and dry.  Psychiatric: Mood normal.           Assessment & Plan:

## 2019-12-26 NOTE — Assessment & Plan Note (Signed)
Well controlled in the office today, continue amlodipine and HCTZ. CMP reviewed.

## 2019-12-26 NOTE — Patient Instructions (Addendum)
Stop by the lab prior to leaving today. I will notify you of your results once received.   Start exercising. You should be getting 150 minutes of moderate intensity exercise weekly.  It is important that you improve your diet. Please limit carbohydrates in the form of white bread, rice, pasta, sweets, fast food, fried food, sugary drinks, etc. Increase your consumption of fresh fruits and vegetables, whole grains, lean protein.  Ensure you are consuming 64 ounces of water daily.  Increase your metformin to 2 tablets twice daily.   Please schedule a follow up appointment in 4 months for diabetes and cholesterol check.  It was a pleasure to see you today!   Preventive Care 83-32 Years Old, Male Preventive care refers to lifestyle choices and visits with your health care provider that can promote health and wellness. This includes:  A yearly physical exam. This is also called an annual well check.  Regular dental and eye exams.  Immunizations.  Screening for certain conditions.  Healthy lifestyle choices, such as eating a healthy diet, getting regular exercise, not using drugs or products that contain nicotine and tobacco, and limiting alcohol use. What can I expect for my preventive care visit? Physical exam Your health care provider will check:  Height and weight. These may be used to calculate body mass index (BMI), which is a measurement that tells if you are at a healthy weight.  Heart rate and blood pressure.  Your skin for abnormal spots. Counseling Your health care provider may ask you questions about:  Alcohol, tobacco, and drug use.  Emotional well-being.  Home and relationship well-being.  Sexual activity.  Eating habits.  Work and work Statistician. What immunizations do I need?  Influenza (flu) vaccine  This is recommended every year. Tetanus, diphtheria, and pertussis (Tdap) vaccine  You may need a Td booster every 10 years. Varicella (chickenpox)  vaccine  You may need this vaccine if you have not already been vaccinated. Zoster (shingles) vaccine  You may need this after age 26. Measles, mumps, and rubella (MMR) vaccine  You may need at least one dose of MMR if you were born in 1957 or later. You may also need a second dose. Pneumococcal conjugate (PCV13) vaccine  You may need this if you have certain conditions and were not previously vaccinated. Pneumococcal polysaccharide (PPSV23) vaccine  You may need one or two doses if you smoke cigarettes or if you have certain conditions. Meningococcal conjugate (MenACWY) vaccine  You may need this if you have certain conditions. Hepatitis A vaccine  You may need this if you have certain conditions or if you travel or work in places where you may be exposed to hepatitis A. Hepatitis B vaccine  You may need this if you have certain conditions or if you travel or work in places where you may be exposed to hepatitis B. Haemophilus influenzae type b (Hib) vaccine  You may need this if you have certain risk factors. Human papillomavirus (HPV) vaccine  If recommended by your health care provider, you may need three doses over 6 months. You may receive vaccines as individual doses or as more than one vaccine together in one shot (combination vaccines). Talk with your health care provider about the risks and benefits of combination vaccines. What tests do I need? Blood tests  Lipid and cholesterol levels. These may be checked every 5 years, or more frequently if you are over 59 years old.  Hepatitis C test.  Hepatitis B test. Screening  Lung cancer screening. You may have this screening every year starting at age 55 if you have a 30-pack-year history of smoking and currently smoke or have quit within the past 15 years.  Prostate cancer screening. Recommendations will vary depending on your family history and other risks.  Colorectal cancer screening. All adults should have this  screening starting at age 50 and continuing until age 75. Your health care provider may recommend screening at age 45 if you are at increased risk. You will have tests every 1-10 years, depending on your results and the type of screening test.  Diabetes screening. This is done by checking your blood sugar (glucose) after you have not eaten for a while (fasting). You may have this done every 1-3 years.  Sexually transmitted disease (STD) testing. Follow these instructions at home: Eating and drinking  Eat a diet that includes fresh fruits and vegetables, whole grains, lean protein, and low-fat dairy products.  Take vitamin and mineral supplements as recommended by your health care provider.  Do not drink alcohol if your health care provider tells you not to drink.  If you drink alcohol: ? Limit how much you have to 0-2 drinks a day. ? Be aware of how much alcohol is in your drink. In the [place] one drink equals one 12 oz bottle of beer (355 mL), one 5 oz glass of wine (148 mL), or one 1 oz glass of hard liquor (44 mL). Lifestyle  Take daily care of your teeth and gums.  Stay active. Exercise for at least 30 minutes on 5 or more days each week.  Do not use any products that contain nicotine or tobacco, such as cigarettes, e-cigarettes, and chewing tobacco. If you need help quitting, ask your health care provider.  If you are sexually active, practice safe sex. Use a condom or other form of protection to prevent STIs (sexually transmitted infections).  Talk with your health care provider about taking a low-dose aspirin every day starting at age 50. What's next?  Go to your health care provider once a year for a well check visit.  Ask your health care provider how often you should have your eyes and teeth checked.  Stay up to date on all vaccines. This information is not intended to replace advice given to you by your health care provider. Make sure you discuss any questions you have  with your health care provider. Document Revised: 06/21/2018 Document Reviewed: 06/21/2018 Elsevier Patient Education  2020 Elsevier Inc.   

## 2019-12-26 NOTE — Assessment & Plan Note (Signed)
Overall feels well managed on current regimen, continue same. Following with asthma and allergy center. No wheezing on exam.

## 2019-12-26 NOTE — Assessment & Plan Note (Signed)
Immunizations UTD. Discussed the importance of a healthy diet and regular exercise in order for weight loss, and to reduce the risk of any potential medical problems.  Exam today stable. Labs reviewed and pending.

## 2019-12-26 NOTE — Assessment & Plan Note (Signed)
Intermittent, using clotrimazole trochees as needed. Refills provided.

## 2019-12-26 NOTE — Assessment & Plan Note (Signed)
Intermittent, using Nexium PRN. Continue same.

## 2019-12-26 NOTE — Assessment & Plan Note (Signed)
Recent CMP and BMP stable, continue to monitor.

## 2019-12-26 NOTE — Assessment & Plan Note (Signed)
Uncontrolled with A1C today of 9.0. He admits to a poor diet and is not exercising. Stressed the importance of improving his lifestyle.  Increase metformin to 2 tablets BID. Urine micro pending. LDL above goal, he would like to work on lifestyle changes. Repeat LDL at next visit. Eye exam due, he will schedule. Foot exam next visit. Pneumonia vaccination UTD.  Follow up in 4 months.

## 2019-12-27 ENCOUNTER — Ambulatory Visit (INDEPENDENT_AMBULATORY_CARE_PROVIDER_SITE_OTHER): Payer: PPO | Admitting: Infectious Diseases

## 2019-12-27 VITALS — BP 139/90 | HR 81 | Wt 293.0 lb

## 2019-12-27 DIAGNOSIS — I10 Essential (primary) hypertension: Secondary | ICD-10-CM

## 2019-12-27 DIAGNOSIS — I890 Lymphedema, not elsewhere classified: Secondary | ICD-10-CM

## 2019-12-27 DIAGNOSIS — C469 Kaposi's sarcoma, unspecified: Secondary | ICD-10-CM | POA: Diagnosis not present

## 2019-12-27 DIAGNOSIS — J4550 Severe persistent asthma, uncomplicated: Secondary | ICD-10-CM

## 2019-12-27 DIAGNOSIS — Z113 Encounter for screening for infections with a predominantly sexual mode of transmission: Secondary | ICD-10-CM | POA: Diagnosis not present

## 2019-12-27 DIAGNOSIS — B20 Human immunodeficiency virus [HIV] disease: Secondary | ICD-10-CM

## 2019-12-27 DIAGNOSIS — N182 Chronic kidney disease, stage 2 (mild): Secondary | ICD-10-CM

## 2019-12-27 DIAGNOSIS — E1122 Type 2 diabetes mellitus with diabetic chronic kidney disease: Secondary | ICD-10-CM

## 2019-12-27 DIAGNOSIS — N1831 Chronic kidney disease, stage 3a: Secondary | ICD-10-CM | POA: Diagnosis not present

## 2019-12-27 NOTE — Assessment & Plan Note (Signed)
He is going to work on exercise and diet.  May need to work on compression socks, keeping feet up.

## 2019-12-27 NOTE — Assessment & Plan Note (Addendum)
He is doing very well. Labs explained.  His vax, incl covid, are up to date.  Offered/refused condoms.  Will see him back in 9 months with labs prior.

## 2019-12-27 NOTE — Assessment & Plan Note (Signed)
Appreciate h/o f/u. yearly He doing well.

## 2019-12-27 NOTE — Assessment & Plan Note (Addendum)
He is going to work on better control.  Wt loss, exercise Foot exam and optho next week.  apprciate FP f/u.

## 2019-12-27 NOTE — Assessment & Plan Note (Signed)
Cr stable.  Will continue to watch.

## 2019-12-27 NOTE — Progress Notes (Signed)
   Subjective:    Patient ID: Steven [name], male    DOB: April 10, 1981, 39 y.o.   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 He is being monitored for his KS by Onc.  He was previously on CTX however in 2011 he had an intracranial bleed.   He was changed from TRV/ISN in 2014 to triumeq. He had f/u with Onc on 06-2019, was felt to be doing well. He also has been seen [facility] for hypothyroidism and DM2. CKD 1 A1C 9% (12-26-19). He has "fallen off the wagon" with his eating.  Knows what he needs to do.   Lab Results  Component Value Date   CHOL 190 12/13/2019   HDL 40 12/13/2019   LDLCALC 121 (H) 12/13/2019   TRIG 171 (H) 12/13/2019   CHOLHDL 4.8 12/13/2019      HIV 1 RNA Quant (copies/mL)  Date Value  12/13/2019 <20 NOT DETECTED  01/08/2019 <20 DETECTED (A)  04/11/2018 <20 NOT DETECTED   CD4 T Cell Abs (/uL)  Date Value  12/13/2019 349 (L)  01/08/2019 386 (L)  04/11/2018 550   Getting xolair qow for asthma. Has been going well.  Feels pretty good, some work aggravation.   Review of Systems  Constitutional: Negative for appetite change and unexpected weight change.  Respiratory: Negative for cough and shortness of breath.   Cardiovascular: Positive for leg swelling. Negative for chest pain.  Gastrointestinal: Negative for constipation and diarrhea.  Genitourinary: Negative for difficulty urinating.  Neurological: Negative for numbness and headaches.  Please see HPI. All other systems reviewed and negative.     Objective:   Physical Exam Vitals reviewed.  Constitutional:      Appearance: Normal appearance.  HENT:     Mouth/Throat:     Mouth: Mucous membranes are dry.  Eyes:     Extraocular Movements: Extraocular movements intact.     Pupils: Pupils are equal, round, and reactive to light.  Cardiovascular:     Rate and Rhythm: Normal rate and regular rhythm.  Pulmonary:     Effort: Pulmonary effort is normal.     Breath sounds:  Normal breath sounds.  Abdominal:     General: Bowel sounds are normal. There is no distension.     Palpations: Abdomen is soft.     Tenderness: There is no abdominal tenderness.  Musculoskeletal:        General: Normal range of motion.     Cervical back: Normal range of motion and neck supple.     Right lower leg: Edema present.     Left lower leg: Edema present.  Neurological:     General: No focal deficit present.     Mental Status: He is alert.  Psychiatric:        Mood and Affect: Mood normal.           Assessment & Plan:

## 2019-12-27 NOTE — Assessment & Plan Note (Signed)
apprciate allergy f/u Well controlled on xolair.

## 2019-12-27 NOTE — Assessment & Plan Note (Signed)
BP is ok today. asx hoepfully will closer to goal with wt loss, exercise (swimming).

## 2019-12-30 ENCOUNTER — Other Ambulatory Visit: Payer: Self-pay

## 2019-12-30 ENCOUNTER — Ambulatory Visit (INDEPENDENT_AMBULATORY_CARE_PROVIDER_SITE_OTHER): Payer: PPO | Admitting: Podiatry

## 2019-12-30 ENCOUNTER — Encounter: Payer: Self-pay | Admitting: Podiatry

## 2019-12-30 DIAGNOSIS — M79676 Pain in unspecified toe(s): Secondary | ICD-10-CM

## 2019-12-30 DIAGNOSIS — E119 Type 2 diabetes mellitus without complications: Secondary | ICD-10-CM | POA: Diagnosis not present

## 2019-12-30 DIAGNOSIS — B351 Tinea unguium: Secondary | ICD-10-CM | POA: Diagnosis not present

## 2019-12-30 NOTE — Patient Instructions (Signed)
Chronic Kidney Disease, Adult Chronic kidney disease (CKD) occurs when the kidneys become damaged slowly over a long period of time. The kidneys are a pair of organs that do many important jobs in the body, including:  Removing waste and extra fluid from the blood to make urine.  Making hormones that maintain the amount of fluid in tissues and blood vessels.  Maintaining the right amount of fluids and chemicals in the body. A small amount of kidney damage may not cause problems, but a large amount of damage may make it hard or impossible for the kidneys to work the way they should. If steps are not taken to slow down kidney damage or to stop it from getting worse, the kidneys may stop working permanently (end-stage renal disease or ESRD). Most of the time, CKD does not go away, but it can often be controlled. People who have CKD are usually able to live normal lives. What are the causes? The most common causes of this condition are diabetes and high blood pressure (hypertension). Other causes include:  Heart and blood vessel (cardiovascular) disease.  Kidney diseases, such as: ? Glomerulonephritis. ? Interstitial nephritis. ? Polycystic kidney disease. ? Renal vascular disease.  Diseases that affect the immune system.  Genetic diseases.  Medicines that damage the kidneys, such as anti-inflammatory medicines.  Being around or being in contact with poisonous (toxic) substances.  A kidney or urinary infection that occurs again and again (recurs).  Vasculitis. This is swelling or inflammation of the blood vessels.  A problem with urine flow that may be caused by: ? Cancer. ? Having kidney stones more than one time. ? An enlarged prostate, in males. What increases the risk? You are more likely to develop this condition if you:  Are older than age 34.  Are male.  Are African-American, Hispanic, Asian, [place], or American [place].  Are a current or former  smoker.  Are obese.  Have a family history of kidney disease or failure.  Often take medicines that are damaging to the kidneys. What are the signs or symptoms? Symptoms of this condition include:  Swelling (edema) of the face, legs, ankles, or feet.  Tiredness (lethargy) and having less energy.  Nausea or vomiting.  Confusion or trouble concentrating.  Problems with urination, such as: ? Painful or burning feeling during urination. ? Decreased urine production. ? Frequent urination, especially at night. ? Bloody urine.  Muscle twitches and cramps, especially in the legs.  Shortness of breath.  Weakness.  Loss of appetite.  Metallic taste in the mouth.  Trouble sleeping.  Dry, itchy skin.  A low blood count (anemia).  Pale lining of the eyelids and surface of the eye (conjunctiva). Symptoms develop slowly and may not be obvious until the kidney damage becomes severe. It is possible to have kidney disease for years without having any symptoms. How is this diagnosed? This condition may be diagnosed based on:  Blood tests.  Urine tests.  Imaging tests, such as an ultrasound or CT scan.  A test in which a sample of tissue is removed from the kidneys to be examined under a microscope (kidney biopsy). These test results will help your health care provider determine how serious the CKD is. How is this treated? There is no cure for most cases of this condition, but treatment usually relieves symptoms and prevents or slows the progression of the disease. Treatment may include:  Making diet changes, which may require you to avoid alcohol, salty foods (sodium),  and foods that are high in potassium, calcium, and protein.  Medicines: ? To lower blood pressure. ? To control blood glucose. ? To relieve anemia. ? To relieve swelling. ? To protect your bones. ? To improve the balance of electrolytes in your blood.  Removing toxic waste from the body through types of  dialysis, if the kidneys can no longer do their job (kidney failure).  Managing any other conditions that are causing your CKD or making it worse. Follow these instructions at home: Medicines  Take over-the-counter and prescription medicines only as told by your health care provider. The dose of some medicines that you take may need to be adjusted.  Do not take any new medicines unless approved by your health care provider. Many medicines can worsen your kidney damage.  Do not take any vitamin and mineral supplements unless approved by your health care provider. Many nutritional supplements can worsen your kidney damage. General instructions  Follow your prescribed diet as told by your health care provider.  Do not use any products that contain nicotine or tobacco, such as cigarettes and e-cigarettes. If you need help quitting, ask your health care provider.  Monitor and track your blood pressure at home. Report changes in your blood pressure as told by your health care provider.  If you are being treated for diabetes, monitor and track your blood sugar (blood glucose) levels as told by your health care provider.  Maintain a healthy weight. If you need help with this, ask your health care provider.  Start or continue an exercise plan. Exercise at least 30 minutes a day, 5 days a week.  Keep your immunizations up to date as told by your health care provider.  Keep all follow-up visits as told by your health care provider. This is important. Where to find more information  American Association of Kidney Patients: BombTimer.gl  National Kidney Foundation: www.kidney.[place]: https://mathis.com/  [facility]: www.lifeoptions.org and www.kidneyschool.org Contact a health care provider if:  Your symptoms get worse.  You develop new symptoms. Get help right away if:  You develop symptoms of ESRD, which include: ? Headaches. ? Numbness in the  hands or feet. ? Easy bruising. ? Frequent hiccups. ? Chest pain. ? Shortness of breath. ? Lack of menstruation, in women.  You have a fever.  You have decreased urine production.  You have pain or bleeding when you urinate. Summary  Chronic kidney disease (CKD) occurs when the kidneys become damaged slowly over a long period of time.  The most common causes of this condition are diabetes and high blood pressure (hypertension).  There is no cure for most cases of this condition, but treatment usually relieves symptoms and prevents or slows the progression of the disease. Treatment may include a combination of medicines and lifestyle changes. This information is not intended to replace advice given to you by your health care provider. Make sure you discuss any questions you have with your health care provider. Document Revised: 06/09/2017 Document Reviewed: 08/04/2016 Elsevier Patient Education  [place].  Onychomycosis/Fungal Toenails  WHAT IS IT? An infection that lies within the keratin of your nail plate that is caused by a fungus.  WHY ME? Fungal infections affect all ages, sexes, races, and creeds.  There may be many factors that predispose you to a fungal infection such as age, coexisting medical conditions such as diabetes, or an autoimmune disease; stress, medications, fatigue, genetics, etc.  Bottom line: fungus thrives  in a warm, moist environment and your shoes offer such a location.  IS IT CONTAGIOUS? Theoretically, yes.  You do not want to share shoes, nail clippers or files with someone who has fungal toenails.  Walking around barefoot in the same room or sleeping in the same bed is unlikely to transfer the organism.  It is important to realize, however, that fungus can spread easily from one nail to the next on the same foot.  HOW DO WE TREAT THIS?  There are several ways to treat this condition.  Treatment may depend on many factors such as age, medications,  pregnancy, liver and kidney conditions, etc.  It is best to ask your doctor which options are available to you.  4. No treatment.   Unlike many other medical concerns, you can live with this condition.  However for many people this can be a painful condition and may lead to ingrown toenails or a bacterial infection.  It is recommended that you keep the nails cut short to help reduce the amount of fungal nail. 5. Topical treatment.  These range from herbal remedies to prescription strength nail lacquers.  About 40-50% effective, topicals require twice daily application for approximately 9 to 12 months or until an entirely new nail has grown out.  The most effective topicals are medical grade medications available through physicians offices. 6. Oral antifungal medications.  With an 80-90% cure rate, the most common oral medication requires 3 to 4 months of therapy and stays in your system for a year as the new nail grows out.  Oral antifungal medications do require blood work to make sure it is a safe drug for you.  A liver function panel will be performed prior to starting the medication and after the first month of treatment.  It is important to have the blood work performed to avoid any harmful side effects.  In general, this medication safe but blood work is required. 7. Laser Therapy.  This treatment is performed by applying a specialized laser to the affected nail plate.  This therapy is noninvasive, fast, and non-painful.  It is not covered by insurance and is therefore, out of pocket.  The results have been very good with a 80-95% cure rate.  [facility] is the only practice in the area to offer this therapy. 8. Permanent Nail Avulsion.  Removing the entire nail so that a new nail will not grow back. Diabetes Mellitus and Foot Care Foot care is an important part of your health, especially when you have diabetes. Diabetes may cause you to have problems because of poor blood flow (circulation)  to your feet and legs, which can cause your skin to:  Become thinner and drier.  Break more easily.  Heal more slowly.  Peel and crack. You may also have nerve damage (neuropathy) in your legs and feet, causing decreased feeling in them. This means that you may not notice minor injuries to your feet that could lead to more serious problems. Noticing and addressing any potential problems early is the best way to prevent future foot problems. How to care for your feet Foot hygiene  Wash your feet daily with warm water and mild soap. Do not use hot water. Then, pat your feet and the areas between your toes until they are completely dry. Do not soak your feet as this can dry your skin.  Trim your toenails straight across. Do not dig under them or around the cuticle. File the edges  of your nails with an Tax adviser or nail file.  Apply a moisturizing lotion or petroleum jelly to the skin on your feet and to dry, brittle toenails. Use lotion that does not contain alcohol and is unscented. Do not apply lotion between your toes. Shoes and socks  Wear clean socks or stockings every day. Make sure they are not too tight. Do not wear knee-high stockings since they may decrease blood flow to your legs.  Wear shoes that fit properly and have enough cushioning. Always look in your shoes before you put them on to be sure there are no objects inside.  To break in new shoes, wear them for just a few hours a day. This prevents injuries on your feet. Wounds, scrapes, corns, and calluses  Check your feet daily for blisters, cuts, bruises, sores, and redness. If you cannot see the bottom of your feet, use a mirror or ask someone for help.  Do not cut corns or calluses or try to remove them with medicine.  If you find a minor scrape, cut, or break in the skin on your feet, keep it and the skin around it clean and dry. You may clean these areas with mild soap and water. Do not clean the area with peroxide,  alcohol, or iodine.  If you have a wound, scrape, corn, or callus on your foot, look at it several times a day to make sure it is healing and not infected. Check for: ? Redness, swelling, or pain. ? Fluid or blood. ? Warmth. ? Pus or a bad smell. General instructions  Do not cross your legs. This may decrease blood flow to your feet.  Do not use heating pads or hot water bottles on your feet. They may burn your skin. If you have lost feeling in your feet or legs, you may not know this is happening until it is too late.  Protect your feet from hot and cold by wearing shoes, such as at the beach or on hot pavement.  Schedule a complete foot exam at least once a year (annually) or more often if you have foot problems. If you have foot problems, report any cuts, sores, or bruises to your health care provider immediately. Contact a health care provider if:  You have a medical condition that increases your risk of infection and you have any cuts, sores, or bruises on your feet.  You have an injury that is not healing.  You have redness on your legs or feet.  You feel burning or tingling in your legs or feet.  You have pain or cramps in your legs and feet.  Your legs or feet are numb.  Your feet always feel cold.  You have pain around a toenail. Get help right away if:  You have a wound, scrape, corn, or callus on your foot and: ? You have pain, swelling, or redness that gets worse. ? You have fluid or blood coming from the wound, scrape, corn, or callus. ? Your wound, scrape, corn, or callus feels warm to the touch. ? You have pus or a bad smell coming from the wound, scrape, corn, or callus. ? You have a fever. ? You have a red line going up your leg. Summary  Check your feet every day for cuts, sores, red spots, swelling, and blisters.  Moisturize feet and legs daily.  Wear shoes that fit properly and have enough cushioning.  If you have foot problems, report any cuts,  sores, or  bruises to your health care provider immediately.  Schedule a complete foot exam at least once a year (annually) or more often if you have foot problems. This information is not intended to replace advice given to you by your health care provider. Make sure you discuss any questions you have with your health care provider. Document Revised: 03/20/2019 Document Reviewed: 07/29/2016 Elsevier Patient Education  [place].

## 2019-12-31 NOTE — Progress Notes (Signed)
Subjective: Steven [name] is a pleasant 39 y.o. male patient seen today at [facility]. Pt has h/o NIDDM with chronic kidney disease and painful mycotic nails b/l that are difficult to trim. Pain interferes with ambulation. Aggravating factors include wearing enclosed shoe gear. Pain is relieved with periodic professional debridement.   Patient has h/o lymphedema b/l LE. He has compression stockings and lymphedema pump at home.   Past Medical History:  Diagnosis Date   Asthma    Cancer ([place])    Colitis 05/27/2011   Diabetes mellitus without complication ([facility])    HIV positive ([place]) 03/23/09   Genotype Y181C   HTN (hypertension)    Kaposi's sarcoma    Syphilis 03/23/09-   1:2    Patient Active Problem List   Diagnosis Date Noted   Preventative health care 08/10/2018   Asthma, severe persistent, well-controlled 06/15/2018   Allergic reaction 06/15/2018   Food allergy 06/15/2018   Seasonal and perennial allergic rhinoconjunctivitis 06/15/2018   AIDS ([place]) 02/09/2018   Gastroesophageal reflux disease 03/28/2017   Type 2 diabetes mellitus ([place]) 03/25/2015   CKD (chronic kidney disease) stage 3, GFR 30-59 ml/min 03/23/2015   Thrush, oral 03/23/2015   Essential hypertension 11/24/2014   Hypothyroidism, postradioiodine therapy 01/13/2014   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 For blood pressure. 90 tablet 3   clotrimazole (MYCELEX) 10 MG troche Take 1 tablet (10 mg total) by mouth 5 (five)  times daily. As needed. 35 tablet 0   EPINEPHrine 0.3 mg/0.3 mL IJ SOAJ injection Inject 0.3 mLs (0.3 mg total) into the muscle as needed for anaphylaxis. 1 each 2   esomeprazole (NEXIUM) 40 MG capsule TAKE ONE CAPSULE BY MOUTH ONCE DAILY AS DIRECTED (Patient taking differently: Take 40 mg by mouth daily. ) 30 capsule 0   fexofenadine (ALLEGRA) 180 MG tablet Take 1 tablet (180 mg total) by mouth daily. 15 tablet 0   FLOVENT HFA 110 MCG/ACT inhaler INHALE 2 PUFFS INTO THE LUNGS TWICE DAILY 12 g 2   fluticasone (FLONASE) 50 MCG/ACT nasal spray SHAKE LIQUID AND USE 2 SPRAYS IN EACH NOSTRIL DAILY 16 g 5   fluticasone-salmeterol (ADVAIR HFA) 230-21 MCG/ACT inhaler USE 2 INHALATIONS BY MOUTH TWICE DAILY 12 g 5   hydrochlorothiazide (HYDRODIURIL) 25 MG tablet Take 1 tablet by mouth once daily for blood pressure. 90 tablet 3   LANCETS MICRO THIN 33G MISC USE TO TEST BLOOD SUGAR ONCE A DAY 100 each 2   levothyroxine (SYNTHROID) 150 MCG tablet TAKE 1 TABLET BY MOUTH DAILY WITH AN EXTRA 1/2 TABLET ONCE WEEKLY 96 tablet 0   metFORMIN (GLUCOPHAGE-XR) 500 MG 24 hr tablet Take 2 tablets in the morning and 2 tablet in the evening for diabetes. 360 tablet 1   montelukast (SINGULAIR) 10 MG tablet Take 1 tablet (10 mg total) by mouth at bedtime. 90 tablet 2   Multiple Vitamin (MULTIVITAMIN) tablet Take 1 tablet by mouth daily.     ONE TOUCH ULTRA TEST test strip TEST BLOOD  GLUCOSE THREE TIMES DAILY AS DIRECTED 100 each 2   PAZEO 0.7 % SOLN Place 1 drop into both eyes daily. 1 Bottle 5   potassium chloride SA (KLOR-CON) 20 MEQ tablet Take 1 tablet (20 mEq total) by mouth daily. 90 tablet 3   TRIUMEQ 600-50-300 MG tablet TAKE 1 TABLET BY MOUTH DAILY 90 tablet 1   XOLAIR 150 MG injection INJECT 300 MG UNDER THE SKIN EVERY TWO WEEKS 4 each 11   Current Facility-Administered Medications on File Prior to Visit  Medication Dose Route Frequency Provider Last Rate Last Admin   omalizumab Arvid Right)  injection 300 mg  300 mg Subcutaneous Q14 Days Kozlow, Donnamarie Poag, MD   300 mg at 12/06/19 0930    Allergies  Allergen Reactions   Lisinopril Swelling    Swelling of lower lip while on lisinopril   Penicillins Hives and Swelling    REACTION: rash and swelling   Levaquin [Levofloxacin In D5w] Rash   Tessalon [Benzonatate] Rash   Aspirin Nausea Only    Objective: Physical Exam  General: Steven [name] is a pleasant 39 y.o. African American male, obese in NAD. AAO x 3.   Vascular:  Capillary refill time to digits immediate b/l. Palpable pedal pulses b/l LE. Pedal hair absent. Lower extremity skin temperature gradient within normal limits. No pain with calf compression b/l. Lymphedema present b/l lower extremities.  Dermatological:  Pedal skin with normal turgor, texture and tone bilaterally. No open wounds bilaterally. No interdigital macerations bilaterally. Toenails 1-5 b/l elongated, discolored, dystrophic, thickened, crumbly with subungual debris and tenderness to dorsal palpation. Brawny edema noted b/l LE.Marland Kitchen  Musculoskeletal:  Normal muscle strength 5/5 to all lower extremity muscle groups bilaterally. No pain crepitus or joint limitation noted with ROM b/l. No gross bony deformities bilaterally. Patient ambulates independent of any assistive aids.  Neurological:  Protective sensation intact 5/5 intact bilaterally with 10g monofilament b/l. Vibratory sensation intact b/l. Proprioception intact bilaterally.  Assessment and Plan:  1. Pain due to onychomycosis of toenail   2. Diabetes mellitus without complication ([place])    -Examined patient. -No new findings. No new orders. -Continue diabetic foot care principles. -Toenails 1-5 b/l were debrided in length and girth with sterile nail nippers and dremel without iatrogenic bleeding.  -Patient to report any pedal injuries to medical professional immediately. -Patient to continue soft, supportive shoe gear daily. -Patient/POA to  call should there be question/concern in the interim.  Return in about 3 months (around 03/31/2020) for diabetic nail trim.  Marzetta Board, DPM

## 2020-01-01 ENCOUNTER — Telehealth: Payer: Self-pay | Admitting: *Deleted

## 2020-01-01 NOTE — Telephone Encounter (Signed)
LVOM requesting status of DM eye exam, if he has one scheduled or if he needs a referral to get it scheduled

## 2020-01-02 ENCOUNTER — Other Ambulatory Visit: Payer: Self-pay

## 2020-01-02 ENCOUNTER — Ambulatory Visit (INDEPENDENT_AMBULATORY_CARE_PROVIDER_SITE_OTHER): Payer: PPO

## 2020-01-02 DIAGNOSIS — J4550 Severe persistent asthma, uncomplicated: Secondary | ICD-10-CM | POA: Diagnosis not present

## 2020-01-06 ENCOUNTER — Ambulatory Visit: Payer: PPO

## 2020-01-06 ENCOUNTER — Telehealth: Payer: Self-pay

## 2020-01-06 NOTE — Telephone Encounter (Signed)
Called patient 2 times to complete his Medicare visit. Patient never answered. Left message on voicemail notifying patient appointment was cancelled and to call the office to reschedule.

## 2020-01-07 DIAGNOSIS — E119 Type 2 diabetes mellitus without complications: Secondary | ICD-10-CM

## 2020-01-08 ENCOUNTER — Encounter: Payer: Self-pay | Admitting: Allergy

## 2020-01-08 ENCOUNTER — Other Ambulatory Visit: Payer: Self-pay

## 2020-01-08 ENCOUNTER — Ambulatory Visit (INDEPENDENT_AMBULATORY_CARE_PROVIDER_SITE_OTHER): Payer: PPO | Admitting: Allergy

## 2020-01-08 VITALS — BP 132/88 | HR 83 | Temp 97.7°F | Resp 16 | Ht 73.5 in | Wt 293.2 lb

## 2020-01-08 DIAGNOSIS — K219 Gastro-esophageal reflux disease without esophagitis: Secondary | ICD-10-CM

## 2020-01-08 DIAGNOSIS — Z91018 Allergy to other foods: Secondary | ICD-10-CM

## 2020-01-08 DIAGNOSIS — H1010 Acute atopic conjunctivitis, unspecified eye: Secondary | ICD-10-CM

## 2020-01-08 DIAGNOSIS — J4550 Severe persistent asthma, uncomplicated: Secondary | ICD-10-CM | POA: Diagnosis not present

## 2020-01-08 DIAGNOSIS — J302 Other seasonal allergic rhinitis: Secondary | ICD-10-CM

## 2020-01-08 DIAGNOSIS — J3089 Other allergic rhinitis: Secondary | ICD-10-CM | POA: Diagnosis not present

## 2020-01-08 MED ORDER — OLOPATADINE HCL 0.2 % OP SOLN: 1.0000 [drp] | Freq: Every day | OPHTHALMIC | 5 refills | Status: DC | PRN

## 2020-01-08 NOTE — Progress Notes (Signed)
Follow Up Note  RE: Steven [name] MRN: [id number] DOB: 04-01-1981 Date of Office Visit: 01/08/2020  Referring provider: Pleas [name], NP Primary care provider: Pleas [name], NP  Chief Complaint: Asthma  History of Present Illness: I had the pleasure of seeing Steven [name] for a follow up visit at the [facility] [place] [facility] [place] on 01/08/2020. He is a 39 y.o. male, who is being followed for asthma on Xolair injections, allergic rhinoconjunctivitis, food allergy and GERD. His previous allergy office visit was on 09/04/2019 with Dr. Maudie Mercury. Today is a regular follow up visit. Up to date with COVID-19 vaccine: yes  Asthma Using albuterol about once a week when outdoors for a longer period of time which causes some shortness of breath and wheezing. Denies any ER/urgent care visits or prednisone use since the last visit. Currently on Advair 230 2 puffs twice a day and rinsing mouth after each use. Receiving Xolair injections every 2 weeks. Taking Singulair at night.  Seasonal and perennial allergic rhinoconjunctivitis Patient is having some symptoms as he was doing yardwork yesterday which flared his allergies. Using Flonase, Allegra and eye drops only if needed with good benefit.  Food allergy Avoiding shellfish and mushrooms and no accidental ingestion.   Gastroesophageal reflux disease Only taking Nexium as needed now.   HIV is stable.  Assessment and Plan: Steven [name] is a 39 y.o. male with: No problem-specific Assessment & Plan notes found for this encounter.  Return in about 4 months (around 05/09/2020).  Meds ordered this encounter  Medications  . Olopatadine HCl 0.2 % SOLN    Sig: Place 1 drop into both eyes daily as needed (itchy/watery eyes).    Dispense:  2.5 mL    Refill:  5   Diagnostics: Spirometry:  Tracings reviewed. His effort: Good reproducible efforts. FVC: 3.24L FEV1: 2.21L, 57% predicted FEV1/FVC ratio: 68% Interpretation: Spirometry  consistent with mixed obstructive and restrictive disease.  Please see scanned spirometry results for details.  Medication List:  Current Outpatient Medications  Medication Sig Dispense Refill  . albuterol (VENTOLIN HFA) 108 (90 Base) MCG/ACT inhaler INHALE 2 PUFFS BY MOUTH EVERY 4 TO 6 HOURS AS NEEDED FOR COUGH OR WHEEZING 42.5 g 0  . amLODipine (NORVASC) 10 MG tablet Take 1 tablet (10 mg total) by mouth daily. For blood pressure. 90 tablet 3  . clotrimazole (MYCELEX) 10 MG troche Take 1 tablet (10 mg total) by mouth 5 (five) times daily. As needed. 35 tablet 0  . EPINEPHrine 0.3 mg/0.3 mL IJ SOAJ injection Inject 0.3 mLs (0.3 mg total) into the muscle as needed for anaphylaxis. 1 each 2  . esomeprazole (NEXIUM) 40 MG capsule TAKE ONE CAPSULE BY MOUTH ONCE DAILY AS DIRECTED (Patient taking differently: Take 40 mg by mouth daily. ) 30 capsule 0  . fexofenadine (ALLEGRA) 180 MG tablet Take 1 tablet (180 mg total) by mouth daily. 15 tablet 0  . fluticasone (FLONASE) 50 MCG/ACT nasal spray SHAKE LIQUID AND USE 2 SPRAYS IN EACH NOSTRIL DAILY 16 g 5  . fluticasone-salmeterol (ADVAIR HFA) 230-21 MCG/ACT inhaler USE 2 INHALATIONS BY MOUTH TWICE DAILY 12 g 5  . hydrochlorothiazide (HYDRODIURIL) 25 MG tablet Take 1 tablet by mouth once daily for blood pressure. 90 tablet 3  . LANCETS MICRO THIN 33G MISC USE TO TEST BLOOD SUGAR ONCE A DAY 100 each 2  . levothyroxine (SYNTHROID) 150 MCG tablet TAKE 1 TABLET BY MOUTH DAILY WITH AN EXTRA 1/2 TABLET ONCE WEEKLY 96 tablet 0  .  metFORMIN (GLUCOPHAGE-XR) 500 MG 24 hr tablet Take 2 tablets in the morning and 2 tablet in the evening for diabetes. 360 tablet 1  . montelukast (SINGULAIR) 10 MG tablet Take 1 tablet (10 mg total) by mouth at bedtime. 90 tablet 2  . Multiple Vitamin (MULTIVITAMIN) tablet Take 1 tablet by mouth daily.    . ONE TOUCH ULTRA TEST test strip TEST BLOOD GLUCOSE THREE TIMES DAILY AS DIRECTED 100 each 2  . PAZEO 0.7 % SOLN Place 1 drop into  both eyes daily. 1 Bottle 5  . potassium chloride SA (KLOR-CON) 20 MEQ tablet Take 1 tablet (20 mEq total) by mouth daily. 90 tablet 3  . TRIUMEQ 600-50-300 MG tablet TAKE 1 TABLET BY MOUTH DAILY 90 tablet 1  . XOLAIR 150 MG injection INJECT 300 MG UNDER THE SKIN EVERY TWO WEEKS 4 each 11  . FLOVENT HFA 110 MCG/ACT inhaler INHALE 2 PUFFS INTO THE LUNGS TWICE DAILY (Patient not taking: Reported on 01/08/2020) 12 g 2  . Olopatadine HCl 0.2 % SOLN Place 1 drop into both eyes daily as needed (itchy/watery eyes). 2.5 mL 5   Current Facility-Administered Medications  Medication Dose Route Frequency Provider Last Rate Last Admin  . omalizumab Arvid Right) injection 300 mg  300 mg Subcutaneous Q14 Days Jiles Prows, MD   300 mg at 01/02/20 1037   Allergies: Allergies  Allergen Reactions  . Lisinopril Swelling    Swelling of lower lip while on lisinopril  . Penicillins Hives and Swelling    REACTION: rash and swelling  . Levaquin [Levofloxacin In D5w] Rash  . Tessalon [Benzonatate] Rash  . Aspirin Nausea Only   I reviewed his past medical history, social history, family history, and environmental history and no significant changes have been reported from his previous visit.  Review of Systems  Constitutional: Negative for appetite change, chills, fever and unexpected weight change.  HENT: Negative for congestion and rhinorrhea.   Eyes: Positive for itching.  Respiratory: Negative for cough, chest tightness, shortness of breath and wheezing.   Gastrointestinal: Negative for abdominal pain.  Skin: Negative for rash.  Allergic/Immunologic: Positive for environmental allergies and food allergies.  Neurological: Negative for headaches.   Objective: BP 132/88   Pulse 83   Temp 97.7 F (36.5 C) (Temporal)   Resp 16   Ht 6' 1.5" (1.867 m)   Wt 293 lb 3.2 oz (133 kg)   SpO2 93%   BMI 38.16 kg/m  Body mass index is 38.16 kg/m. Physical Exam Vitals and nursing note reviewed.   Constitutional:      Appearance: Normal appearance. He is well-developed.  HENT:     Head: Normocephalic and atraumatic.     Right Ear: Tympanic membrane and external ear normal.     Left Ear: Tympanic membrane and external ear normal.     Nose: Nose normal.     Mouth/Throat:     Mouth: Mucous membranes are moist.     Pharynx: Oropharynx is clear.  Eyes:     Comments: B/l injected conjunctiva  Cardiovascular:     Rate and Rhythm: Normal rate and regular rhythm.     Heart sounds: Normal heart sounds. No murmur heard.  No friction rub. No gallop.   Pulmonary:     Effort: Pulmonary effort is normal.     Breath sounds: Normal breath sounds. No wheezing, rhonchi or rales.  Musculoskeletal:     Cervical back: Neck supple.  Skin:    General: Skin is warm.  Findings: No rash.  Neurological:     Mental Status: He is alert and oriented to person, place, and time.  Psychiatric:        Mood and Affect: Mood normal.        Behavior: Behavior normal.    Previous notes and tests were reviewed. The plan was reviewed with the patient/family, and all questions/concerned were addressed.  It was my pleasure to see Steven [name] today and participate in his care. Please feel free to contact me with any questions or concerns.  Sincerely,  Rexene Alberts, DO Allergy & Immunology  [facility] office: [phone] [facility] office: [place] office: [phone]

## 2020-01-08 NOTE — Assessment & Plan Note (Signed)
Past history - 2015 skin testing was positive to grass, ragweed, weed, mold, dust mites, cat and dog. Interim history - symptoms flared after doing yard work yesterday.  Read about allergy injections - if you are interested in doing allergy injections then recommend repeat testing and schedule for allergy testing - must be off antihistamines for 3 days before.  Take Flonase 1-2 sprays per nostril daily as needed for nasal congestion.   May use over the counter antihistamines such as Zyrtec (cetirizine), Claritin (loratadine), Allegra (fexofenadine), or Xyzal (levocetirizine) daily as needed. May take twice a day if needed.   May use olopatadine eye drops 0.2% once a day as needed for itchy/watery eyes.  Continue environmental control measures.

## 2020-01-08 NOTE — Assessment & Plan Note (Signed)
Well-controlled.  May use Nexium 40mg  daily as needed.   Continue heartburn lifestyle modification diet.

## 2020-01-08 NOTE — Patient Instructions (Addendum)
Asthma, severe persistent  Today's spirometry showed some restriction and obstruction  Daily controller medication(s):continue Advair 230 2 puffs twice a day with spacer and rinse mouth afterwards ? Continue Xolair 300mg  every 14 days.  ? Continue montelukast 10mg  daily.   Prior to physical activity:May use albuterol rescue inhaler 2 puffs 5 to 15 minutes prior to strenuous physical activities.  Rescue medications:May use albuterol rescue inhaler 2 puffs or nebulizer every 4 to 6 hours as needed for shortness of breath, chest tightness, coughing, and wheezing. Monitor frequency of use.   During upper respiratory infections: Start Qvar 80 2 puffs twice a day for 1-2 weeks. Asthma control goals:  Full participation in all desired activities (may need albuterol before activity) Albuterol use two times or less a week on average (not counting use with activity) Cough interfering with sleep two times or less a month Oral steroids no more than once a year No hospitalizations  Seasonal and perennial allergic rhinoconjunctivitis Past history - 2015 skin testing was positive to grass, ragweed, weed, mold, dust mites, cat and dog.  Read about allergy injections - if you are interested in doing allergy injections then recommend that you get repeat testing and schedule for allergy testing - must be off antihistamines for 3 days before.   Take Flonase 1-2 sprays per nostril daily as needed for nasal congestion.   May use over the counter antihistamines such as Zyrtec (cetirizine), Claritin (loratadine), Allegra (fexofenadine), or Xyzal (levocetirizine) daily as needed. May take twice a day if needed.   May use olopatadine eye drops 0.2% once a day as needed for itchy/watery eyes.  Continue environmental control measures.  Food allergy  Continue to avoid shellfish and mushroom.  For mild symptoms you can take over the counter antihistamines such as Benadryl and monitor symptoms closely.  If symptoms worsen or if you have severe symptoms including breathing issues, throat closure, significant swelling, whole body hives, severe diarrhea and vomiting, lightheadedness then inject epinephrine and seek immediate medical care afterwards.  Gastroesophageal reflux disease  May use Nexium 40mg  daily as needed.   Continue heartburn lifestyle modification diet.   Follow up in 4 months or sooner if needed.

## 2020-01-08 NOTE — Assessment & Plan Note (Signed)
Well-controlled with below regimen. Symptoms slightly flared today after doing yardwork yesterday.  No prednisone or ER/urgent care visits since last appointment.  Today's spirometry showed some restriction and obstruction  Daily controller medication(s):continue Advair 230 2 puffs twice a day with spacer and rinse mouth afterwards ? Continue Xolair 300mg  every 14 days.  ? Continue montelukast 10mg  daily.   Prior to physical activity:May use albuterol rescue inhaler 2 puffs 5 to 15 minutes prior to strenuous physical activities.  Rescue medications:May use albuterol rescue inhaler 2 puffs or nebulizer every 4 to 6 hours as needed for shortness of breath, chest tightness, coughing, and wheezing. Monitor frequency of use.   During upper respiratory infections: Start Qvar 80 2 puffs twice a day for 1-2 weeks.  Repeat spirometry at next visit

## 2020-01-08 NOTE — Assessment & Plan Note (Signed)
Past history -  2019 immunocap was slightly positive: alpha gal, wheat, corn, peanut, soy, egg, shellfish. Patient tolerates all of the above foods with no issues except for the shellfish. Reaction in August 2020 requiring IM Epi and ER visit.  Interim history - No reactions.  Continue to avoid shellfish and mushroom.  For mild symptoms you can take over the counter antihistamines such as Benadryl and monitor symptoms closely. If symptoms worsen or if you have severe symptoms including breathing issues, throat closure, significant swelling, whole body hives, severe diarrhea and vomiting, lightheadedness then inject epinephrine and seek immediate medical care afterwards.

## 2020-01-09 MED ORDER — GLIPIZIDE ER 5 MG PO TB24: 5.0000 mg | ORAL_TABLET | Freq: Every day | ORAL | 1 refills | Status: DC

## 2020-01-09 MED ORDER — METFORMIN HCL ER 500 MG PO TB24: ORAL_TABLET | ORAL | 1 refills | Status: DC

## 2020-01-16 ENCOUNTER — Ambulatory Visit: Payer: Self-pay

## 2020-01-27 ENCOUNTER — Other Ambulatory Visit: Payer: Self-pay

## 2020-01-27 ENCOUNTER — Ambulatory Visit (INDEPENDENT_AMBULATORY_CARE_PROVIDER_SITE_OTHER): Payer: PPO

## 2020-01-27 DIAGNOSIS — J4550 Severe persistent asthma, uncomplicated: Secondary | ICD-10-CM | POA: Diagnosis not present

## 2020-02-01 ENCOUNTER — Other Ambulatory Visit: Payer: Self-pay | Admitting: Endocrinology

## 2020-02-10 ENCOUNTER — Ambulatory Visit: Payer: Self-pay

## 2020-02-20 ENCOUNTER — Ambulatory Visit (INDEPENDENT_AMBULATORY_CARE_PROVIDER_SITE_OTHER): Payer: PPO

## 2020-02-20 ENCOUNTER — Other Ambulatory Visit: Payer: Self-pay

## 2020-02-20 DIAGNOSIS — J4550 Severe persistent asthma, uncomplicated: Secondary | ICD-10-CM | POA: Diagnosis not present

## 2020-02-26 ENCOUNTER — Other Ambulatory Visit: Payer: Self-pay | Admitting: Allergy

## 2020-02-27 ENCOUNTER — Other Ambulatory Visit: Payer: Self-pay | Admitting: Endocrinology

## 2020-03-05 ENCOUNTER — Other Ambulatory Visit: Payer: Self-pay | Admitting: Endocrinology

## 2020-03-06 ENCOUNTER — Ambulatory Visit: Payer: Self-pay

## 2020-03-16 ENCOUNTER — Other Ambulatory Visit: Payer: Self-pay | Admitting: Allergy

## 2020-03-19 ENCOUNTER — Other Ambulatory Visit: Payer: Self-pay

## 2020-03-19 ENCOUNTER — Ambulatory Visit (INDEPENDENT_AMBULATORY_CARE_PROVIDER_SITE_OTHER): Payer: PPO

## 2020-03-19 ENCOUNTER — Ambulatory Visit: Payer: PPO

## 2020-03-19 DIAGNOSIS — J4550 Severe persistent asthma, uncomplicated: Secondary | ICD-10-CM

## 2020-04-02 ENCOUNTER — Ambulatory Visit: Payer: Self-pay

## 2020-04-08 ENCOUNTER — Ambulatory Visit: Payer: PPO | Admitting: Podiatry

## 2020-04-09 ENCOUNTER — Ambulatory Visit: Payer: Self-pay

## 2020-04-15 ENCOUNTER — Ambulatory Visit: Payer: Self-pay

## 2020-04-17 ENCOUNTER — Encounter: Payer: Self-pay | Admitting: Infectious Diseases

## 2020-04-22 ENCOUNTER — Ambulatory Visit: Payer: Self-pay

## 2020-04-27 ENCOUNTER — Ambulatory Visit: Payer: PPO | Admitting: Primary Care

## 2020-04-30 ENCOUNTER — Other Ambulatory Visit: Payer: Self-pay

## 2020-04-30 ENCOUNTER — Telehealth: Payer: Self-pay | Admitting: Primary Care

## 2020-04-30 ENCOUNTER — Ambulatory Visit (INDEPENDENT_AMBULATORY_CARE_PROVIDER_SITE_OTHER): Payer: PPO

## 2020-04-30 DIAGNOSIS — J4550 Severe persistent asthma, uncomplicated: Secondary | ICD-10-CM | POA: Diagnosis not present

## 2020-04-30 NOTE — Telephone Encounter (Addendum)
Steven [name], his patient missed his recent appointment for diabetes follow-up with me. Will you get him rescheduled?  Thanks!   ----- Message from Elayne Guerin, [facility] sent at 03/10/2020 10:37 AM EDT ----- Regarding: SUPD Measure                                         Provider Carlis Abbott,  I am a [facility] clinical pharmacist that reviews patients for statin quality initiatives.     Per review of chart and payor information, patient has a diagnosis of diabetes but is not currently filling a statin prescription.  This places patient into the SUPD (Statin Use In Patients with Diabetes) measure for CMS.    ASCVD risk 10.6% Patient has an upcoming appointment with you on 04/27/20   Please consider ONE of the following recommendations:  Initiate high intensity statin Atorvastatin 40mg  once daily, #90, 3 refills  Rosuvastatin 20mg  once daily, #90, 3 refills  Initiate moderate intensity          statin with reduced frequency if prior          statin intolerance 1x weekly, #13, 3 refills  2x weekly, #26, 3 refills  3x weekly, #39, 3 refills  Code for past statin intolerance or  other exclusions (required annually)  Provider Requirements: Associate code during an office visit or telehealth encounter  Drug Induced Myopathy G72.0  Myopathy, unspecified G72.9  Myositis, unspecified M60.9  Rhabdomyolysis F63.84  Alcoholic fatty liver Y65.9  Cirrhosis of liver K74.69  Prediabetes R73.03  PCOS E28.2  Toxic liver disease, unspecified K71.9  Adverse effect of antihyperlipidemic and antiarteriosclerotic drugs, initial encounter T46.6X5A   Please let us know your decision.    Thank you!   Elayne Guerin, PharmD, [place] Clinical Pharmacist [phone]

## 2020-05-01 NOTE — Telephone Encounter (Signed)
Called patient to reschedule appointment. LVM to call back and rsc.

## 2020-05-06 ENCOUNTER — Ambulatory Visit: Payer: PPO | Admitting: Allergy

## 2020-05-06 NOTE — Progress Notes (Deleted)
Follow Up Note  RE: Steven [name] MRN: [id number] DOB: 1981/01/02 Date of Office Visit: 05/06/2020  Referring provider: Pleas [name], NP Primary care provider: Pleas [name], NP  Chief Complaint: No chief complaint on file.  History of Present Illness: I had the pleasure of seeing Steven [name] for a follow up visit at the [facility] [place] [facility] [place] on 05/06/2020. He is a 39 y.o. male, who is being followed for asthma on omalizumab 300 mg every 2 weeks, allergic rhinoconjunctivitis, food allergy and GERD. His previous allergy office visit was on 01/08/2020 with Dr. Maudie Mercury. Today is a regular follow up visit.  Asthma, severe persistent  Today's spirometry showed some restriction and obstruction  Daily controller medication(s):continue Advair 230 2 puffs twice a day with spacer and rinse mouth afterwards ? Continue Xolair 300mg  every 14 days.  ? Continue montelukast 10mg  daily.   Prior to physical activity:May use albuterol rescue inhaler 2 puffs 5 to 15 minutes prior to strenuous physical activities.  Rescue medications:May use albuterol rescue inhaler 2 puffs or nebulizer every 4 to 6 hours as needed for shortness of breath, chest tightness, coughing, and wheezing. Monitor frequency of use.   During upper respiratory infections: Start Qvar 80 2 puffs twice a day for 1-2 weeks.  Asthma control goals:   Full participation in all desired activities (may need albuterol before activity)  Albuterol use two times or less a week on average (not counting use with activity)  Cough interfering with sleep two times or less a month  Oral steroids no more than once a year  No hospitalizations  Seasonal and perennial allergic rhinoconjunctivitis Past history - 2015 skin testing was positive to grass, ragweed, weed, mold, dust mites, cat and dog.  Read about allergy injections - if you are interested in doing allergy injections then recommend that you get repeat testing  and schedule for allergy testing - must be off antihistamines for 3 days before.   Take Flonase 1-2 sprays per nostril daily as needed for nasal congestion.   May use over the counter antihistamines such as Zyrtec (cetirizine), Claritin (loratadine), Allegra (fexofenadine), or Xyzal (levocetirizine) daily as needed. May take twice a day if needed.   May use olopatadine eye drops 0.2% once a day as needed for itchy/watery eyes.  Continue environmental control measures.  Food allergy  Continue to avoid shellfish and mushroom.  For mild symptoms you can take over the counter antihistamines such as Benadryl and monitor symptoms closely. If symptoms worsen or if you have severe symptoms including breathing issues, throat closure, significant swelling, whole body hives, severe diarrhea and vomiting, lightheadedness then inject epinephrine and seek immediate medical care afterwards.  Gastroesophageal reflux disease  May use Nexium 40mg  daily as needed.   Continue heartburn lifestyle modification diet.  Assessment and Plan: Steven [name] is a 39 y.o. male with: No problem-specific Assessment & Plan notes found for this encounter.  No follow-ups on file.  No orders of the defined types were placed in this encounter.  Lab Orders  No laboratory test(s) ordered today    Diagnostics: Spirometry:  Tracings reviewed. His effort: {Blank single:19197::"Good reproducible efforts.","It was hard to get consistent efforts and there is a question as to whether this reflects a maximal maneuver.","Poor effort, data can not be interpreted."} FVC: ***L FEV1: ***L, ***% predicted FEV1/FVC ratio: ***% Interpretation: {Blank single:19197::"Spirometry consistent with mild obstructive disease","Spirometry consistent with moderate obstructive disease","Spirometry consistent with severe obstructive disease","Spirometry consistent with possible restrictive disease","Spirometry consistent  with mixed obstructive  and restrictive disease","Spirometry uninterpretable due to technique","Spirometry consistent with normal pattern","No overt abnormalities noted given today's efforts"}.  Please see scanned spirometry results for details.  Skin Testing: {Blank single:19197::"Select foods","Environmental allergy panel","Environmental allergy panel and select foods","Food allergy panel","None","Deferred due to recent antihistamines use"}. Positive test to: ***. Negative test to: ***.  Results discussed with patient/family.   Medication List:  Current Outpatient Medications  Medication Sig Dispense Refill  . albuterol (VENTOLIN HFA) 108 (90 Base) MCG/ACT inhaler INHALE 2 PUFFS BY MOUTH EVERY 4 TO 6 HOURS AS NEEDED FOR COUGH OR WHEEZING 42.5 g 0  . amLODipine (NORVASC) 10 MG tablet Take 1 tablet (10 mg total) by mouth daily. For blood pressure. 90 tablet 3  . clotrimazole (MYCELEX) 10 MG troche Take 1 tablet (10 mg total) by mouth 5 (five) times daily. As needed. 35 tablet 0  . EPINEPHrine 0.3 mg/0.3 mL IJ SOAJ injection Inject 0.3 mLs (0.3 mg total) into the muscle as needed for anaphylaxis. 1 each 2  . esomeprazole (NEXIUM) 40 MG capsule TAKE ONE CAPSULE BY MOUTH ONCE DAILY AS DIRECTED (Patient taking differently: Take 40 mg by mouth daily. ) 30 capsule 0  . fexofenadine (ALLEGRA) 180 MG tablet Take 1 tablet (180 mg total) by mouth daily. 15 tablet 0  . FLOVENT HFA 110 MCG/ACT inhaler INHALE 2 PUFFS INTO THE LUNGS TWICE DAILY (Patient not taking: Reported on 01/08/2020) 12 g 2  . fluticasone (FLONASE) 50 MCG/ACT nasal spray SHAKE LIQUID AND USE 2 SPRAYS IN EACH NOSTRIL DAILY 16 g 5  . fluticasone-salmeterol (ADVAIR HFA) 230-21 MCG/ACT inhaler USE 2 INHALATIONS BY MOUTH TWICE DAILY 12 g 5  . glipiZIDE (GLIPIZIDE XL) 5 MG 24 hr tablet Take 1 tablet (5 mg total) by mouth daily with breakfast. For diabetes. 90 tablet 1  . hydrochlorothiazide (HYDRODIURIL) 25 MG tablet Take 1 tablet by mouth once daily for blood  pressure. 90 tablet 3  . LANCETS MICRO THIN 33G MISC USE TO TEST BLOOD SUGAR ONCE A DAY 100 each 2  . levothyroxine (SYNTHROID) 150 MCG tablet TAKE 1 TABLET BY MOUTH DAILY WITH AN EXTRA 1/2 TABLET ONCE WEEKLY 96 tablet 2  . metFORMIN (GLUCOPHAGE-XR) 500 MG 24 hr tablet Take 1 tablets by mouth twice daily for diabetes. 180 tablet 1  . montelukast (SINGULAIR) 10 MG tablet Take 1 tablet (10 mg total) by mouth at bedtime. 90 tablet 2  . Multiple Vitamin (MULTIVITAMIN) tablet Take 1 tablet by mouth daily.    . Olopatadine HCl 0.2 % SOLN Place 1 drop into both eyes daily as needed (itchy/watery eyes). 2.5 mL 5  . ONE TOUCH ULTRA TEST test strip TEST BLOOD GLUCOSE THREE TIMES DAILY AS DIRECTED 100 each 2  . PAZEO 0.7 % SOLN Place 1 drop into both eyes daily. 1 Bottle 5  . potassium chloride SA (KLOR-CON) 20 MEQ tablet Take 1 tablet (20 mEq total) by mouth daily. 90 tablet 3  . TRIUMEQ 600-50-300 MG tablet TAKE 1 TABLET BY MOUTH DAILY 90 tablet 1  . XOLAIR 150 MG injection INJECT 300 MG UNDER THE SKIN EVERY TWO WEEKS 4 each 11   Current Facility-Administered Medications  Medication Dose Route Frequency Provider Last Rate Last Admin  . omalizumab Arvid Right) injection 300 mg  300 mg Subcutaneous Q14 Days Jiles Prows, MD   300 mg at 04/30/20 1436   Allergies: Allergies  Allergen Reactions  . Lisinopril Swelling    Swelling of lower lip while on lisinopril  .  Penicillins Hives and Swelling    REACTION: rash and swelling  . Levaquin [Levofloxacin In D5w] Rash  . Tessalon [Benzonatate] Rash  . Aspirin Nausea Only   I reviewed his past medical history, social history, family history, and environmental history and no significant changes have been reported from his previous visit.  Review of Systems  Constitutional: Negative for appetite change, chills, fever and unexpected weight change.  HENT: Negative for congestion and rhinorrhea.   Eyes: Positive for itching.  Respiratory: Negative for cough,  chest tightness, shortness of breath and wheezing.   Gastrointestinal: Negative for abdominal pain.  Skin: Negative for rash.  Allergic/Immunologic: Positive for environmental allergies and food allergies.  Neurological: Negative for headaches.   Objective: There were no vitals taken for this visit. There is no height or weight on file to calculate BMI. Physical Exam Vitals and nursing note reviewed.  Constitutional:      Appearance: Normal appearance. He is well-developed.  HENT:     Head: Normocephalic and atraumatic.     Right Ear: Tympanic membrane and external ear normal.     Left Ear: Tympanic membrane and external ear normal.     Nose: Nose normal.     Mouth/Throat:     Mouth: Mucous membranes are moist.     Pharynx: Oropharynx is clear.  Eyes:     Comments: B/l injected conjunctiva  Cardiovascular:     Rate and Rhythm: Normal rate and regular rhythm.     Heart sounds: Normal heart sounds. No murmur heard.  No friction rub. No gallop.   Pulmonary:     Effort: Pulmonary effort is normal.     Breath sounds: Normal breath sounds. No wheezing, rhonchi or rales.  Musculoskeletal:     Cervical back: Neck supple.  Skin:    General: Skin is warm.     Findings: No rash.  Neurological:     Mental Status: He is alert and oriented to person, place, and time.  Psychiatric:        Mood and Affect: Mood normal.        Behavior: Behavior normal.    Previous notes and tests were reviewed. The plan was reviewed with the patient/family, and all questions/concerned were addressed.  It was my pleasure to see Steven [name] today and participate in his care. Please feel free to contact me with any questions or concerns.  Sincerely,  Rexene Alberts, DO Allergy & Immunology  [facility] office: [place] office: [phone]

## 2020-05-07 NOTE — Telephone Encounter (Signed)
Called patient to schedule office visit. LVM to call back. Letter sent.

## 2020-05-14 ENCOUNTER — Ambulatory Visit: Payer: Self-pay

## 2020-05-27 ENCOUNTER — Other Ambulatory Visit: Payer: Self-pay | Admitting: Primary Care

## 2020-05-27 DIAGNOSIS — I10 Essential (primary) hypertension: Secondary | ICD-10-CM

## 2020-05-28 ENCOUNTER — Encounter: Payer: Self-pay | Admitting: Podiatry

## 2020-05-28 ENCOUNTER — Other Ambulatory Visit: Payer: Self-pay

## 2020-05-28 ENCOUNTER — Ambulatory Visit (INDEPENDENT_AMBULATORY_CARE_PROVIDER_SITE_OTHER): Payer: PPO | Admitting: Primary Care

## 2020-05-28 ENCOUNTER — Ambulatory Visit (INDEPENDENT_AMBULATORY_CARE_PROVIDER_SITE_OTHER): Payer: PPO

## 2020-05-28 ENCOUNTER — Ambulatory Visit: Payer: Self-pay

## 2020-05-28 ENCOUNTER — Ambulatory Visit (INDEPENDENT_AMBULATORY_CARE_PROVIDER_SITE_OTHER): Payer: PPO | Admitting: Podiatry

## 2020-05-28 VITALS — BP 130/88 | HR 75 | Temp 97.5°F | Ht 73.0 in | Wt 292.0 lb

## 2020-05-28 DIAGNOSIS — J4550 Severe persistent asthma, uncomplicated: Secondary | ICD-10-CM | POA: Diagnosis not present

## 2020-05-28 DIAGNOSIS — B351 Tinea unguium: Secondary | ICD-10-CM

## 2020-05-28 DIAGNOSIS — E785 Hyperlipidemia, unspecified: Secondary | ICD-10-CM | POA: Insufficient documentation

## 2020-05-28 DIAGNOSIS — E119 Type 2 diabetes mellitus without complications: Secondary | ICD-10-CM | POA: Diagnosis not present

## 2020-05-28 DIAGNOSIS — M79676 Pain in unspecified toe(s): Secondary | ICD-10-CM | POA: Diagnosis not present

## 2020-05-28 LAB — POCT GLYCOSYLATED HEMOGLOBIN (HGB A1C): Hemoglobin A1C: 6.1 % — AB (ref 4.0–5.6)

## 2020-05-28 MED ORDER — ROSUVASTATIN CALCIUM 5 MG PO TABS: 5.0000 mg | ORAL_TABLET | Freq: Every day | ORAL | 3 refills | Status: DC

## 2020-05-28 NOTE — Progress Notes (Signed)
Subjective:    Patient ID: Steven [name], male    DOB: 08-11-1980, 39 y.o.   MRN: [id number]  HPI  This visit occurred during the SARS-CoV-2 public health emergency.  Safety protocols were in place, including screening questions prior to the visit, additional usage of staff PPE, and extensive cleaning of exam room while observing appropriate contact time as indicated for disinfecting solutions.   Steven [name] is a 38 year old male with a history of AIDS, type 2 diabetes, hypertension, asthma, hypothyroidism, lymphedema who presents today for follow up of diabetes.   Current medications include: Metformin XR 500 mg BID, Glipizide XL 5 mg.  He is checking his blood glucose 2 times daily and is getting readings of:  AM fasting 80-100 2-3 hours after dinner: 120's-160's  Last A1C: 9.0 in June 2021, 6.1 today Last Eye Exam: Due Last Foot Exam: Due Pneumonia Vaccination: Completed in 2019 ACE/ARB: Urine micro UTD. Statin: None.  BP Readings from Last 3 Encounters:  05/28/20 130/88  01/08/20 132/88  12/27/19 139/90   Since his last visit he has improved his diet, no longer eating late at night and is eating less junk food/fast food. He is walking regularly.   The 10-year ASCVD risk score Steven [name] DC Brooke Bonito., et al., 2013) is: 11%   Values used to calculate the score:     Age: 68 years     Sex: Male     Is Non-Hispanic African American: Yes     Diabetic: Yes     Tobacco smoker: No     Systolic Blood Pressure: 619 mmHg     Is BP treated: Yes     HDL Cholesterol: 40 mg/dL     Total Cholesterol: 190 mg/dL     Review of Systems  Respiratory: Negative for shortness of breath.   Cardiovascular: Negative for chest pain.  Neurological: Negative for dizziness and headaches.       Past Medical History:  Diagnosis Date  . Asthma   . Cancer ([place])   . Colitis 05/27/2011  . Diabetes mellitus without complication ([place])   . HIV positive ([place]) 03/23/09   Genotype Y181C  . HTN  (hypertension)   . Kaposi's sarcoma   . Syphilis 03/23/09-   1:2     Social History   Socioeconomic History  . Marital status: Single    Spouse name: Not on file  . Number of children: Not on file  . Years of education: Not on file  . Highest education level: Not on file  Occupational History  . Not on file  Tobacco Use  . Smoking status: Never Smoker  . Smokeless tobacco: Never Used  Vaping Use  . Vaping Use: Never used  Substance and Sexual Activity  . Alcohol use: Yes    Alcohol/week: 0.0 standard drinks    Comment: socially - less now  . Drug use: No  . Sexual activity: Not Currently    Partners: Male    Birth control/protection: Condom    Comment: pt. declined condoms  Other Topics Concern  . Not on file  Social History Narrative   Single.    No children.   Works in Therapist, art   Enjoys DJ, traveling.    Social Determinants of Health   Financial Resource Strain:   . Difficulty of Paying Living Expenses: Not on file  Food Insecurity:   . Worried About Charity fundraiser in the Last Year: Not on file  . Ran Out of Food  in the Last Year: Not on file  Transportation Needs:   . Lack of Transportation (Medical): Not on file  . Lack of Transportation (Non-Medical): Not on file  Physical Activity:   . Days of Exercise per Week: Not on file  . Minutes of Exercise per Session: Not on file  Stress:   . Feeling of Stress : Not on file  Social Connections:   . Frequency of Communication with Friends and Family: Not on file  . Frequency of Social Gatherings with Friends and Family: Not on file  . Attends Religious Services: Not on file  . Active Member of Clubs or Organizations: Not on file  . Attends Archivist Meetings: Not on file  . Marital Status: Not on file  Intimate Partner Violence:   . Fear of Current or Ex-Partner: Not on file  . Emotionally Abused: Not on file  . Physically Abused: Not on file  . Sexually Abused: Not on file    Past  Surgical History:  Procedure Laterality Date  . BRAIN SURGERY    . IR GENERIC HISTORICAL  02/12/2016   IR REMOVAL TUN ACCESS W/ PORT W/O FL MOD SED 02/12/2016 [name], MD [facility]-INTER[facility] RAD    Family History  Problem Relation Age of Onset  . Pancreatic cancer Mother   . Cancer Mother   . Diabetes Paternal Grandmother   . Thyroid disease Paternal Grandmother     Allergies  Allergen Reactions  . Lisinopril Swelling    Swelling of lower lip while on lisinopril  . Penicillins Hives and Swelling    REACTION: rash and swelling  . Levaquin [Levofloxacin In D5w] Rash  . Tessalon [Benzonatate] Rash  . Aspirin Nausea Only    Current Outpatient Medications on File Prior to Visit  Medication Sig Dispense Refill  . albuterol (VENTOLIN HFA) 108 (90 Base) MCG/ACT inhaler INHALE 2 PUFFS BY MOUTH EVERY 4 TO 6 HOURS AS NEEDED FOR COUGH OR WHEEZING 42.5 g 0  . amLODipine (NORVASC) 10 MG tablet Take 1 tablet (10 mg total) by mouth daily. For blood pressure. 90 tablet 3  . clotrimazole (MYCELEX) 10 MG troche Take 1 tablet (10 mg total) by mouth 5 (five) times daily. As needed. 35 tablet 0  . EPINEPHrine 0.3 mg/0.3 mL IJ SOAJ injection Inject 0.3 mLs (0.3 mg total) into the muscle as needed for anaphylaxis. 1 each 2  . esomeprazole (NEXIUM) 40 MG capsule TAKE ONE CAPSULE BY MOUTH ONCE DAILY AS DIRECTED (Patient taking differently: Take 40 mg by mouth daily. ) 30 capsule 0  . fexofenadine (ALLEGRA) 180 MG tablet Take 1 tablet (180 mg total) by mouth daily. 15 tablet 0  . FLOVENT HFA 110 MCG/ACT inhaler INHALE 2 PUFFS INTO THE LUNGS TWICE DAILY 12 g 2  . fluticasone (FLONASE) 50 MCG/ACT nasal spray SHAKE LIQUID AND USE 2 SPRAYS IN EACH NOSTRIL DAILY 16 g 5  . fluticasone-salmeterol (ADVAIR HFA) 230-21 MCG/ACT inhaler USE 2 INHALATIONS BY MOUTH TWICE DAILY 12 g 5  . glipiZIDE (GLIPIZIDE XL) 5 MG 24 hr tablet Take 1 tablet (5 mg total) by mouth daily with breakfast. For diabetes. 90 tablet 1  .  hydrochlorothiazide (HYDRODIURIL) 25 MG tablet Take 1 tablet by mouth once daily for blood pressure. 90 tablet 3  . LANCETS MICRO THIN 33G MISC USE TO TEST BLOOD SUGAR ONCE A DAY 100 each 2  . levothyroxine (SYNTHROID) 150 MCG tablet TAKE 1 TABLET BY MOUTH DAILY WITH AN EXTRA 1/2 TABLET ONCE WEEKLY  96 tablet 2  . metFORMIN (GLUCOPHAGE-XR) 500 MG 24 hr tablet Take 1 tablets by mouth twice daily for diabetes. 180 tablet 1  . montelukast (SINGULAIR) 10 MG tablet Take 1 tablet (10 mg total) by mouth at bedtime. 90 tablet 2  . Multiple Vitamin (MULTIVITAMIN) tablet Take 1 tablet by mouth daily.    . Olopatadine HCl 0.2 % SOLN Place 1 drop into both eyes daily as needed (itchy/watery eyes). 2.5 mL 5  . ONE TOUCH ULTRA TEST test strip TEST BLOOD GLUCOSE THREE TIMES DAILY AS DIRECTED 100 each 2  . PAZEO 0.7 % SOLN Place 1 drop into both eyes daily. 1 Bottle 5  . potassium chloride SA (KLOR-CON) 20 MEQ tablet Take 1 tablet (20 mEq total) by mouth daily. 90 tablet 3  . TRIUMEQ 600-50-300 MG tablet TAKE 1 TABLET BY MOUTH DAILY 90 tablet 1  . XOLAIR 150 MG injection INJECT 300 MG UNDER THE SKIN EVERY TWO WEEKS 4 each 11  . FLULAVAL QUADRIVALENT 0.5 ML injection      Current Facility-Administered Medications on File Prior to Visit  Medication Dose Route Frequency Provider Last Rate Last Admin  . omalizumab Arvid Right) injection 300 mg  300 mg Subcutaneous Q14 Days Kozlow, Donnamarie Poag, MD   300 mg at 04/30/20 1436    BP 130/88   Pulse 75   Temp (!) 97.5 F (36.4 C) (Temporal)   Ht 6\' 1"  (1.854 m)   Wt 292 lb (132.5 kg)   SpO2 96%   BMI 38.52 kg/m    Objective:   Physical Exam Cardiovascular:     Rate and Rhythm: Normal rate and regular rhythm.  Pulmonary:     Effort: Pulmonary effort is normal.     Breath sounds: Normal breath sounds.  Musculoskeletal:     Cervical back: Neck supple.  Skin:    General: Skin is warm and dry.            Assessment & Plan:

## 2020-05-28 NOTE — Patient Instructions (Signed)
Start rosuvastatin (Crestor) 5 mg once daily for cholesterol and heart protection.   Continue to work on your diet! Ensure you are consuming 64 ounces of water daily.  Continue exercising. You should be getting 150 minutes of moderate intensity exercise weekly.  Schedule a lab appointment for two months to repeat cholesterol.  We will see you back next year for your annual physical. Please schedule this up front.   It was a pleasure to see you today!

## 2020-05-28 NOTE — Assessment & Plan Note (Signed)
LDL above goal with ASCVD risk score of 11%. We've discussed this before and during prior visits he has declined.  Today he agrees treatment. Rx for rosuvastatin 5 mg sent to pharmacy.  Repeat lipids and LFT's in 2 months.

## 2020-05-28 NOTE — Assessment & Plan Note (Addendum)
A1C of 6.1 today which is an improvement from 9.0 in June 2021. Commended him on dietary changes!  Encouraged regular exercise.  He agrees to go on statin therapy today.  Rx sent to pharmacy.  Urine micro, pneumonia vaccination, foot exam UTD.  Follow up in 6 months. Continue Metformin XR 500 mg BID and Glipizide XL 5 mg daily.

## 2020-05-28 NOTE — Progress Notes (Signed)
Subjective:   Patient ID: Steven [name], male   DOB: 39 y.o.   MRN: [id number]   HPI Patient presents with very thickened nailbeds 1-5 both feet that he cannot take care of and make shoe gear difficult   ROS      Objective:  Physical Exam  Neuro vascular status intact with patient found to have severe nail disease 1-5 both feet thick yellow brittle and painful with palpation     Assessment:  Chronic mycotic nail infection 1-5 both feet with at risk patient     Plan:  Debridement of nailbeds 1-5 both feet with no iatrogenic bleeding and reappoint routine care

## 2020-06-12 ENCOUNTER — Ambulatory Visit: Payer: Self-pay

## 2020-06-16 ENCOUNTER — Inpatient Hospital Stay: Payer: PPO | Attending: Oncology | Admitting: Oncology

## 2020-06-17 ENCOUNTER — Encounter: Payer: Self-pay | Admitting: *Deleted

## 2020-06-17 NOTE — Progress Notes (Signed)
No show for 1 year follow up on 06/16/20. Sent scheduling message to reschedule w/Dr. Benay Spice for late January and to call patient.

## 2020-06-18 ENCOUNTER — Telehealth: Payer: Self-pay | Admitting: Oncology

## 2020-06-18 NOTE — Telephone Encounter (Signed)
R/s apt per 12/8 sch msg - left message for pt with apt date and time

## 2020-07-07 ENCOUNTER — Other Ambulatory Visit: Payer: Self-pay | Admitting: Primary Care

## 2020-07-07 DIAGNOSIS — E119 Type 2 diabetes mellitus without complications: Secondary | ICD-10-CM

## 2020-07-28 ENCOUNTER — Ambulatory Visit: Payer: PPO | Admitting: Primary Care

## 2020-08-03 ENCOUNTER — Inpatient Hospital Stay: Payer: PPO | Attending: Oncology | Admitting: Oncology

## 2020-08-05 ENCOUNTER — Ambulatory Visit: Payer: PPO | Admitting: Primary Care

## 2020-08-10 ENCOUNTER — Ambulatory Visit: Payer: PPO | Admitting: Primary Care

## 2020-08-12 ENCOUNTER — Ambulatory Visit (INDEPENDENT_AMBULATORY_CARE_PROVIDER_SITE_OTHER): Payer: PPO

## 2020-08-12 ENCOUNTER — Other Ambulatory Visit: Payer: Self-pay

## 2020-08-12 DIAGNOSIS — J4550 Severe persistent asthma, uncomplicated: Secondary | ICD-10-CM | POA: Diagnosis not present

## 2020-08-17 ENCOUNTER — Ambulatory Visit: Payer: PPO | Admitting: Primary Care

## 2020-08-25 ENCOUNTER — Ambulatory Visit: Payer: PPO | Admitting: Primary Care

## 2020-08-26 ENCOUNTER — Ambulatory Visit (INDEPENDENT_AMBULATORY_CARE_PROVIDER_SITE_OTHER): Payer: PPO | Admitting: *Deleted

## 2020-08-26 ENCOUNTER — Other Ambulatory Visit: Payer: Self-pay

## 2020-08-26 DIAGNOSIS — J4550 Severe persistent asthma, uncomplicated: Secondary | ICD-10-CM | POA: Diagnosis not present

## 2020-08-30 ENCOUNTER — Other Ambulatory Visit: Payer: Self-pay | Admitting: Primary Care

## 2020-08-30 DIAGNOSIS — I10 Essential (primary) hypertension: Secondary | ICD-10-CM

## 2020-08-31 ENCOUNTER — Other Ambulatory Visit: Payer: Self-pay | Admitting: Allergy

## 2020-09-01 ENCOUNTER — Other Ambulatory Visit: Payer: Self-pay | Admitting: Allergy

## 2020-09-04 ENCOUNTER — Ambulatory Visit: Payer: PPO | Admitting: Primary Care

## 2020-09-06 ENCOUNTER — Other Ambulatory Visit: Payer: Self-pay | Admitting: Primary Care

## 2020-09-06 DIAGNOSIS — E119 Type 2 diabetes mellitus without complications: Secondary | ICD-10-CM

## 2020-09-09 ENCOUNTER — Ambulatory Visit: Payer: Self-pay

## 2020-09-11 ENCOUNTER — Ambulatory Visit (INDEPENDENT_AMBULATORY_CARE_PROVIDER_SITE_OTHER): Payer: PPO | Admitting: Podiatry

## 2020-09-11 ENCOUNTER — Other Ambulatory Visit: Payer: Self-pay

## 2020-09-11 ENCOUNTER — Encounter: Payer: Self-pay | Admitting: Podiatry

## 2020-09-11 DIAGNOSIS — N182 Chronic kidney disease, stage 2 (mild): Secondary | ICD-10-CM | POA: Diagnosis not present

## 2020-09-11 DIAGNOSIS — E119 Type 2 diabetes mellitus without complications: Secondary | ICD-10-CM

## 2020-09-11 DIAGNOSIS — L84 Corns and callosities: Secondary | ICD-10-CM

## 2020-09-11 DIAGNOSIS — B351 Tinea unguium: Secondary | ICD-10-CM

## 2020-09-11 DIAGNOSIS — E1122 Type 2 diabetes mellitus with diabetic chronic kidney disease: Secondary | ICD-10-CM | POA: Diagnosis not present

## 2020-09-11 DIAGNOSIS — M79676 Pain in unspecified toe(s): Secondary | ICD-10-CM

## 2020-09-11 NOTE — Progress Notes (Signed)
Subjective: Steven [name] is a pleasant 40 y.o. male patient seen today at [facility] foot [facility]. Pt has h/o NIDDM with chronic kidney disease and painful mycotic nails b/l that are difficult to trim. Pain interferes with ambulation. Aggravating factors include wearing enclosed shoe gear. Pain is relieved with periodic professional debridement.   Patient's blood glucose was 100 mg/dl this morning.  PCP is Steven Friendly, NP. Last visit was 07/29/2019.  Patient has h/o lymphedema b/l LE. He has compression stockings and lymphedema pump at home.   Steven [name] voices no new pedal concerns on today's visit.  Allergies  Allergen Reactions  . Lisinopril Swelling    Swelling of lower lip while on lisinopril  . Penicillins Hives and Swelling    REACTION: rash and swelling  . Levaquin [Levofloxacin In D5w] Rash  . Tessalon [Benzonatate] Rash  . Aspirin Nausea Only    Objective: Physical Exam  General: Steven [name] is a pleasant 40 y.o. African American male, obese in NAD. AAO x 3.   Vascular:  Capillary refill time to digits immediate b/l. Palpable pedal pulses b/l LE. Pedal hair absent. Lower extremity skin temperature gradient within normal limits. No pain with calf compression b/l. Lymphedema present b/l lower extremities.  Dermatological:  Pedal skin with normal turgor, texture and tone bilaterally. No open wounds bilaterally. No interdigital macerations bilaterally. Toenails 1-5 b/l elongated, discolored, dystrophic, thickened, crumbly with subungual debris and tenderness to dorsal palpation. Brawny edema noted b/l LE.Marland Kitchen Hyperkeratotic lesion(s) L hallux and R hallux.  No erythema, no edema, no drainage, no fluctuance.  Musculoskeletal:  Normal muscle strength 5/5 to all lower extremity muscle groups bilaterally. No pain crepitus or joint limitation noted with ROM b/l. No gross bony deformities bilaterally. Patient ambulates independent of any assistive aids.  Neurological:  Protective  sensation intact 5/5 intact bilaterally with 10g monofilament b/l. Vibratory sensation intact b/l. Proprioception intact bilaterally.  Assessment and Plan:  1. Pain due to onychomycosis of toenail   2. Callus   3. Type 2 diabetes mellitus with stage 2 chronic kidney disease, without long-term current use of insulin ([place])    -Examined patient. -No new findings. No new orders. -Continue diabetic foot care principles. -Toenails 1-5 b/l were debrided in length and girth with sterile nail nippers and dremel without iatrogenic bleeding.  -Callus(es) L hallux and R hallux pared utilizing sterile scalpel blade without complication or incident. Total number debrided =2. -Patient to report any pedal injuries to medical professional immediately. -Patient/POA to call should there be question/concern in the interim.  Return in about 3 months (around 12/12/2020).  Marzetta Board, DPM

## 2020-09-14 ENCOUNTER — Other Ambulatory Visit: Payer: Self-pay

## 2020-09-14 ENCOUNTER — Other Ambulatory Visit (HOSPITAL_COMMUNITY)
Admission: RE | Admit: 2020-09-14 | Discharge: 2020-09-14 | Disposition: A | Discharge status: Home / Self Care | Payer: PPO | Source: Ambulatory Visit | Attending: Infectious Diseases | Admitting: Infectious Diseases

## 2020-09-14 ENCOUNTER — Other Ambulatory Visit: Payer: PPO

## 2020-09-14 DIAGNOSIS — Z113 Encounter for screening for infections with a predominantly sexual mode of transmission: Secondary | ICD-10-CM | POA: Insufficient documentation

## 2020-09-14 DIAGNOSIS — N182 Chronic kidney disease, stage 2 (mild): Secondary | ICD-10-CM | POA: Diagnosis not present

## 2020-09-14 DIAGNOSIS — N1831 Chronic kidney disease, stage 3a: Secondary | ICD-10-CM | POA: Diagnosis not present

## 2020-09-14 DIAGNOSIS — E1122 Type 2 diabetes mellitus with diabetic chronic kidney disease: Secondary | ICD-10-CM

## 2020-09-14 DIAGNOSIS — B20 Human immunodeficiency virus [HIV] disease: Secondary | ICD-10-CM

## 2020-09-15 ENCOUNTER — Telehealth: Payer: Self-pay

## 2020-09-15 ENCOUNTER — Other Ambulatory Visit: Payer: Self-pay

## 2020-09-15 DIAGNOSIS — B20 Human immunodeficiency virus [HIV] disease: Secondary | ICD-10-CM

## 2020-09-15 LAB — URINE CYTOLOGY ANCILLARY ONLY
Chlamydia: NEGATIVE
Comment: NEGATIVE
Comment: NORMAL
Neisseria Gonorrhea: NEGATIVE

## 2020-09-15 LAB — T-HELPER CELL (CD4) - (RCID CLINIC ONLY)
CD4 % Helper T Cell: 19 % — ABNORMAL LOW (ref 33–65)
CD4 T Cell Abs: 283 /uL — ABNORMAL LOW (ref 400–1790)

## 2020-09-15 NOTE — Telephone Encounter (Signed)
Called patient to relay lab results and schedule follow up lab appointment. Unable to leave VM. Sent patient mychart message requesting a call back to schedule.  Steven Lore Lorita Officer, RN

## 2020-09-15 NOTE — Telephone Encounter (Signed)
-----   Message from Campbell Riches, MD sent at 09/15/2020 12:13 PM EST ----- I need a repeat potassium please Thanks Merry Proud  ----- Message ----- From: Cheyenne Adas Lab Results In Sent: 09/14/2020   4:41 PM EST To: Campbell Riches, MD

## 2020-09-16 LAB — HIV-1 RNA QUANT-NO REFLEX-BLD
HIV 1 RNA Quant: <20 Copies/mL — ABNORMAL HIGH
HIV-1 RNA Quant, Log: <1.3 Log cps/mL — ABNORMAL HIGH

## 2020-09-16 LAB — COMPREHENSIVE METABOLIC PANEL
AG Ratio: 1.6 (calc) (ref 1.0–2.5)
ALT: 20 U/L (ref 9–46)
AST: 18 U/L (ref 10–40)
Albumin: 4.4 g/dL (ref 3.6–5.1)
Alkaline phosphatase (APISO): 45 U/L (ref 36–130)
BUN: 9 mg/dL (ref 7–25)
CO2: 35 mmol/L — ABNORMAL HIGH (ref 20–32)
Calcium: 9.2 mg/dL (ref 8.6–10.3)
Chloride: 97 mmol/L — ABNORMAL LOW (ref 98–110)
Creat: 1.17 mg/dL (ref 0.60–1.35)
Globulin: 2.7 g/dL (calc) (ref 1.9–3.7)
Glucose, Bld: 178 mg/dL — ABNORMAL HIGH (ref 65–99)
Potassium: 3.2 mmol/L — ABNORMAL LOW (ref 3.5–5.3)
Sodium: 142 mmol/L (ref 135–146)
Total Bilirubin: 1.3 mg/dL — ABNORMAL HIGH (ref 0.2–1.2)
Total Protein: 7.1 g/dL (ref 6.1–8.1)

## 2020-09-16 LAB — FLUORESCENT TREPONEMAL AB(FTA)-IGG-BLD: Fluorescent Treponemal ABS: REACTIVE — AB

## 2020-09-16 LAB — CBC
HCT: 49.1 % (ref 38.5–50.0)
Hemoglobin: 15.9 g/dL (ref 13.2–17.1)
MCH: 26.8 pg — ABNORMAL LOW (ref 27.0–33.0)
MCHC: 32.4 g/dL (ref 32.0–36.0)
MCV: 82.8 fL (ref 80.0–100.0)
MPV: 12.7 fL — ABNORMAL HIGH (ref 7.5–12.5)
Platelets: 219 10*3/uL (ref 140–400)
RBC: 5.93 10*6/uL — ABNORMAL HIGH (ref 4.20–5.80)
RDW: 13.9 % (ref 11.0–15.0)
WBC: 4.5 10*3/uL (ref 3.8–10.8)

## 2020-09-16 LAB — LIPID PANEL
Cholesterol: 99 mg/dL (ref <200)
HDL: 35 mg/dL — ABNORMAL LOW (ref >=40)
LDL Cholesterol (Calc): 45 mg/dL (calc)
Non-HDL Cholesterol (Calc): 64 mg/dL (calc) (ref <130)
Total CHOL/HDL Ratio: 2.8 (calc) (ref <5.0)
Triglycerides: 102 mg/dL (ref <150)

## 2020-09-16 LAB — RPR TITER: RPR Titer: 1:2 {titer} — ABNORMAL HIGH

## 2020-09-16 LAB — RPR: RPR Ser Ql: REACTIVE — AB

## 2020-09-18 ENCOUNTER — Ambulatory Visit: Payer: PPO | Admitting: Primary Care

## 2020-09-21 ENCOUNTER — Other Ambulatory Visit: Payer: PPO

## 2020-09-21 DIAGNOSIS — M25531 Pain in right wrist: Secondary | ICD-10-CM | POA: Diagnosis not present

## 2020-09-21 DIAGNOSIS — M25561 Pain in right knee: Secondary | ICD-10-CM | POA: Diagnosis not present

## 2020-09-25 ENCOUNTER — Other Ambulatory Visit: Payer: PPO

## 2020-09-25 ENCOUNTER — Other Ambulatory Visit: Payer: Self-pay

## 2020-09-25 DIAGNOSIS — B20 Human immunodeficiency virus [HIV] disease: Secondary | ICD-10-CM

## 2020-09-26 LAB — COMPREHENSIVE METABOLIC PANEL
AG Ratio: 1.7 (calc) (ref 1.0–2.5)
ALT: 11 U/L (ref 9–46)
AST: 11 U/L (ref 10–40)
Albumin: 4.6 g/dL (ref 3.6–5.1)
Alkaline phosphatase (APISO): 47 U/L (ref 36–130)
BUN: 14 mg/dL (ref 7–25)
CO2: 35 mmol/L — ABNORMAL HIGH (ref 20–32)
Calcium: 10 mg/dL (ref 8.6–10.3)
Chloride: 93 mmol/L — ABNORMAL LOW (ref 98–110)
Creat: 1.11 mg/dL (ref 0.60–1.35)
Globulin: 2.7 g/dL (calc) (ref 1.9–3.7)
Glucose, Bld: 177 mg/dL — ABNORMAL HIGH (ref 65–99)
Potassium: 3.4 mmol/L — ABNORMAL LOW (ref 3.5–5.3)
Sodium: 140 mmol/L (ref 135–146)
Total Bilirubin: 1 mg/dL (ref 0.2–1.2)
Total Protein: 7.3 g/dL (ref 6.1–8.1)

## 2020-09-30 ENCOUNTER — Ambulatory Visit: Payer: PPO | Admitting: Primary Care

## 2020-10-01 ENCOUNTER — Other Ambulatory Visit: Payer: Self-pay | Admitting: Orthopaedic Surgery

## 2020-10-01 DIAGNOSIS — M25561 Pain in right knee: Secondary | ICD-10-CM

## 2020-10-02 ENCOUNTER — Ambulatory Visit
Admission: RE | Admit: 2020-10-02 | Discharge: 2020-10-02 | Disposition: A | Discharge status: Home / Self Care | Payer: PPO | Source: Ambulatory Visit | Attending: Orthopaedic Surgery | Admitting: Orthopaedic Surgery

## 2020-10-02 ENCOUNTER — Encounter: Payer: PPO | Admitting: Infectious Diseases

## 2020-10-02 ENCOUNTER — Other Ambulatory Visit: Payer: Self-pay

## 2020-10-02 DIAGNOSIS — M25561 Pain in right knee: Secondary | ICD-10-CM

## 2020-10-06 ENCOUNTER — Other Ambulatory Visit: Payer: Self-pay | Admitting: Allergy

## 2020-10-12 ENCOUNTER — Ambulatory Visit (INDEPENDENT_AMBULATORY_CARE_PROVIDER_SITE_OTHER): Payer: PPO

## 2020-10-12 ENCOUNTER — Other Ambulatory Visit: Payer: Self-pay

## 2020-10-12 DIAGNOSIS — J4550 Severe persistent asthma, uncomplicated: Secondary | ICD-10-CM

## 2020-10-12 DIAGNOSIS — M25561 Pain in right knee: Secondary | ICD-10-CM | POA: Diagnosis not present

## 2020-10-14 ENCOUNTER — Other Ambulatory Visit: Payer: Self-pay | Admitting: Allergy

## 2020-10-20 ENCOUNTER — Ambulatory Visit (INDEPENDENT_AMBULATORY_CARE_PROVIDER_SITE_OTHER): Payer: PPO | Admitting: Infectious Diseases

## 2020-10-20 ENCOUNTER — Other Ambulatory Visit: Payer: Self-pay

## 2020-10-20 ENCOUNTER — Encounter: Payer: Self-pay | Admitting: Infectious Diseases

## 2020-10-20 VITALS — BP 147/94 | HR 92 | Temp 97.2°F | Resp 16 | Ht 73.0 in | Wt 300.0 lb

## 2020-10-20 DIAGNOSIS — I890 Lymphedema, not elsewhere classified: Secondary | ICD-10-CM

## 2020-10-20 DIAGNOSIS — A539 Syphilis, unspecified: Secondary | ICD-10-CM | POA: Diagnosis not present

## 2020-10-20 DIAGNOSIS — Z79899 Other long term (current) drug therapy: Secondary | ICD-10-CM | POA: Diagnosis not present

## 2020-10-20 DIAGNOSIS — N1831 Chronic kidney disease, stage 3a: Secondary | ICD-10-CM | POA: Diagnosis not present

## 2020-10-20 DIAGNOSIS — J4550 Severe persistent asthma, uncomplicated: Secondary | ICD-10-CM | POA: Diagnosis not present

## 2020-10-20 DIAGNOSIS — N182 Chronic kidney disease, stage 2 (mild): Secondary | ICD-10-CM

## 2020-10-20 DIAGNOSIS — Z23 Encounter for immunization: Secondary | ICD-10-CM

## 2020-10-20 DIAGNOSIS — B20 Human immunodeficiency virus [HIV] disease: Secondary | ICD-10-CM | POA: Diagnosis not present

## 2020-10-20 DIAGNOSIS — E1122 Type 2 diabetes mellitus with diabetic chronic kidney disease: Secondary | ICD-10-CM

## 2020-10-20 DIAGNOSIS — I10 Essential (primary) hypertension: Secondary | ICD-10-CM

## 2020-10-20 DIAGNOSIS — Z113 Encounter for screening for infections with a predominantly sexual mode of transmission: Secondary | ICD-10-CM | POA: Diagnosis not present

## 2020-10-20 NOTE — Addendum Note (Signed)
Addended by: Theresia Majors A on: 10/20/2020 02:40 PM   Modules accepted: Orders

## 2020-10-20 NOTE — Assessment & Plan Note (Signed)
RPR stable

## 2020-10-20 NOTE — Assessment & Plan Note (Signed)
Appreciate PCP eval Attributes to rushing to come to clinic.

## 2020-10-20 NOTE — Assessment & Plan Note (Signed)
Doing well on trirumeq.  Will continue  Given condoms rtc in 9 months vax up to date except PCV23. Will update.

## 2020-10-20 NOTE — Assessment & Plan Note (Signed)
Appreciate his PCP f/u He is going to resume his stockings.

## 2020-10-20 NOTE — Assessment & Plan Note (Signed)
Cr in normal range today.

## 2020-10-20 NOTE — Progress Notes (Signed)
   Subjective:    Patient ID: Steven [name], male  DOB: [birth year], 40 y.o.        MRN: [id number]   HPI 40yo M with hx of AIDS, KS(03-2009). He is being monitored for his KS by Onc.  He was previously on CTX however in 2011 he had an intracranial bleed.   He was changed from TRV/ISN in 2014 to triumeq. He had f/u with Onc on 06-2019, was felt to be doing well. He also has been seen byFP [facility] for hypothyroidism and DM2. CKD 1 A1C 6.1% (05-2020).FSG have been better lately, had prev been off diet (late night snacks).  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  Had MRI R knee 09-2020 due to limitation of movement and pain: 1. Intact ligamentous structures and no acute bony findings. 2. No meniscal tears. 3. 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 4. Large joint effusion with masslike areas of synovial thickening suspicious for lipoma arborescens. Is to have knee surgery 11-19-20.   HIV 1 RNA Quant  Date Value  09/14/2020 <20 Copies/mL (H)  12/13/2019 <20 NOT DETECTED copies/mL  01/08/2019 <20 DETECTED copies/mL (A)   CD4 T Cell Abs (/uL)  Date Value  09/14/2020 283 (L)  12/13/2019 349 (L)  01/08/2019 386 (L)     Health Maintenance  Topic Date Due  . OPHTHALMOLOGY EXAM  02/09/2019  . HEMOGLOBIN A1C  11/25/2020  . URINE MICROALBUMIN  12/25/2020  . COVID-19 Vaccine (4 - Booster for Pfizer series) 12/28/2020  . INFLUENZA VACCINE  02/08/2021  . FOOT EXAM  05/28/2021  . TETANUS/TDAP  12/27/2023  . PNEUMOCOCCAL POLYSACCHARIDE VACCINE AGE 24-64 HIGH RISK  Completed  . Hepatitis C Screening  Completed  . HIV Screening  Completed  . HPV VACCINES  Aged Out   Had visits to doctor this winter for asthma.    Review of Systems  Constitutional: Negative for weight loss.  Respiratory: Negative for cough and wheezing.   Gastrointestinal: Negative for blood in stool, constipation and diarrhea.  Genitourinary: Negative for dysuria.   Neurological: Negative for tingling and sensory change.    Please see HPI. All other systems reviewed and negative.     Objective:  Physical Exam Vitals reviewed.  Constitutional:      Appearance: Normal appearance.  HENT:     Mouth/Throat:     Mouth: Mucous membranes are moist.     Pharynx: No oropharyngeal exudate.  Eyes:     Extraocular Movements: Extraocular movements intact.     Pupils: Pupils are equal, round, and reactive to light.  Cardiovascular:     Rate and Rhythm: Normal rate and regular rhythm.  Pulmonary:     Effort: Pulmonary effort is normal.     Breath sounds: Normal breath sounds.  Abdominal:     General: Bowel sounds are normal. There is no distension.     Palpations: Abdomen is soft.     Tenderness: There is no abdominal tenderness.  Musculoskeletal:     Cervical back: Normal range of motion and neck supple.     Right lower leg: Edema present.     Left lower leg: Edema present.  Neurological:     General: No focal deficit present.     Mental Status: He is alert.  Psychiatric:        Mood and Affect: Mood normal.            Assessment & Plan:

## 2020-10-20 NOTE — Assessment & Plan Note (Signed)
Appreciate PCP mgmt States he has been doing well despite pollen

## 2020-10-20 NOTE — Assessment & Plan Note (Signed)
A1C is much better. On glipized/metformin Has f/u with PCP next week.  Will recheck A1C next visit.

## 2020-10-22 ENCOUNTER — Telehealth: Payer: Self-pay | Admitting: Endocrinology

## 2020-10-22 ENCOUNTER — Ambulatory Visit: Payer: PPO | Admitting: Primary Care

## 2020-10-22 NOTE — Telephone Encounter (Signed)
Please schedule his follow-up visit with labs for thyroid.  Will need this to clear him for orthopedic surgery

## 2020-10-22 NOTE — Telephone Encounter (Signed)
LMTCB to schedule follow up appointment and prior labs

## 2020-10-26 ENCOUNTER — Ambulatory Visit (INDEPENDENT_AMBULATORY_CARE_PROVIDER_SITE_OTHER): Payer: PPO | Admitting: *Deleted

## 2020-10-26 ENCOUNTER — Other Ambulatory Visit: Payer: Self-pay

## 2020-10-26 DIAGNOSIS — J4550 Severe persistent asthma, uncomplicated: Secondary | ICD-10-CM

## 2020-10-26 MED ORDER — OMALIZUMAB 150 MG ~~LOC~~ SOLR
300.0000 mg | SUBCUTANEOUS | Status: DC
  Administered 2020-10-26 – 2022-10-12 (×13): 300 mg via SUBCUTANEOUS

## 2020-10-27 ENCOUNTER — Telehealth: Payer: Self-pay

## 2020-10-27 NOTE — Telephone Encounter (Signed)
Called patient needs to make appointment for surgical clearance. L/m to call office to set up. I have PPW for appointment.

## 2020-10-27 NOTE — Telephone Encounter (Signed)
Gave message below and patient is scheduled for Thursday.

## 2020-10-29 ENCOUNTER — Other Ambulatory Visit: Payer: Self-pay

## 2020-10-29 ENCOUNTER — Ambulatory Visit (INDEPENDENT_AMBULATORY_CARE_PROVIDER_SITE_OTHER): Payer: PPO | Admitting: Primary Care

## 2020-10-29 ENCOUNTER — Encounter: Payer: Self-pay | Admitting: Primary Care

## 2020-10-29 VITALS — BP 136/80 | HR 91 | Temp 97.8°F | Ht 73.0 in | Wt 300.0 lb

## 2020-10-29 DIAGNOSIS — N1831 Chronic kidney disease, stage 3a: Secondary | ICD-10-CM | POA: Diagnosis not present

## 2020-10-29 DIAGNOSIS — J4550 Severe persistent asthma, uncomplicated: Secondary | ICD-10-CM

## 2020-10-29 DIAGNOSIS — I10 Essential (primary) hypertension: Secondary | ICD-10-CM | POA: Diagnosis not present

## 2020-10-29 DIAGNOSIS — Z01818 Encounter for other preprocedural examination: Secondary | ICD-10-CM | POA: Diagnosis not present

## 2020-10-29 DIAGNOSIS — E890 Postprocedural hypothyroidism: Secondary | ICD-10-CM

## 2020-10-29 DIAGNOSIS — E119 Type 2 diabetes mellitus without complications: Secondary | ICD-10-CM

## 2020-10-29 LAB — BASIC METABOLIC PANEL
BUN: 16 mg/dL (ref 6–23)
CO2: 35 mEq/L — ABNORMAL HIGH (ref 19–32)
Calcium: 9.4 mg/dL (ref 8.4–10.5)
Chloride: 94 mEq/L — ABNORMAL LOW (ref 96–112)
Creatinine, Ser: 1.22 mg/dL (ref 0.40–1.50)
GFR: 73.66 mL/min (ref >60.00)
Glucose, Bld: 160 mg/dL — ABNORMAL HIGH (ref 70–99)
Potassium: 3 mEq/L — ABNORMAL LOW (ref 3.5–5.1)
Sodium: 139 mEq/L (ref 135–145)

## 2020-10-29 LAB — CBC
HCT: 47.3 % (ref 39.0–52.0)
Hemoglobin: 15 g/dL (ref 13.0–17.0)
MCHC: 31.7 g/dL (ref 30.0–36.0)
MCV: 83.2 fl (ref 78.0–100.0)
Platelets: 213 10*3/uL (ref 150.0–400.0)
RBC: 5.68 Mil/uL (ref 4.22–5.81)
RDW: 13.8 % (ref 11.5–15.5)
WBC: 5.3 10*3/uL (ref 4.0–10.5)

## 2020-10-29 LAB — TSH: TSH: 1.04 u[IU]/mL (ref 0.35–4.50)

## 2020-10-29 LAB — HEMOGLOBIN A1C: Hgb A1c MFr Bld: 6.7 % — ABNORMAL HIGH (ref 4.6–6.5)

## 2020-10-29 NOTE — Assessment & Plan Note (Signed)
Stable in the office today, continue amlodipine 10 mg, HCTZ 25 mg. BMP pending.

## 2020-10-29 NOTE — Assessment & Plan Note (Addendum)
Vitals stable. Exam stable.  Labs reviewed from march 2022 additional labs pending.  ECG today with NSR with rate of 73. No ST changes, PAC/PVC. Appears similar to ECG from 2019  Should be cleared pending lab results.  Await results

## 2020-10-29 NOTE — Progress Notes (Signed)
Subjective:    Patient ID: Steven [name], male    DOB: May 18, 1981, 40 y.o.   MRN: [id number]  HPI  Steven [name] is a very pleasant 40 y.o. male with a history of hypertension, hypothyroidism, CKD, Kaposi's sarcoma, asthma, Grave's disease who presents today for surgical clearance.   He is pending right knee arthroscopic synovectomy for 11/19/20.   Review of Systems  Constitutional: Negative for unexpected weight change.  HENT: Negative for rhinorrhea.   Eyes: Negative for visual disturbance.  Respiratory: Negative for shortness of breath.   Cardiovascular: Negative for chest pain.  Gastrointestinal: Negative for constipation and diarrhea.  Genitourinary: Negative for difficulty urinating.  Musculoskeletal: Positive for arthralgias.  Skin: Negative for color change and rash.  Allergic/Immunologic: Positive for environmental allergies.  Neurological: Negative for dizziness, numbness and headaches.  Hematological: Negative for adenopathy.         Past Medical History:  Diagnosis Date  . Asthma   . Cancer ([place])   . Colitis 05/27/2011  . Diabetes mellitus without complication ([place])   . 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 HTN (hypertension)   . Kaposi's sarcoma   . Syphilis 03/23/09-   1:2    Social History   Socioeconomic History  . Marital status: Single    Spouse name: Not on file  . Number of children: Not on file  . Years of education: Not on file  . Highest education level: Not on file  Occupational History  . Not on file  Tobacco Use  . Smoking status: Never Smoker  . Smokeless tobacco: Never Used  Vaping Use  . Vaping Use: Never used  Substance and Sexual Activity  . Alcohol use: Yes    Alcohol/week: 0.0 standard drinks    Comment: socially - less now  . Drug use: No  . Sexual activity: Not Currently    Partners: Male    Birth control/protection: Condom    Comment: pt. declined condoms  Other Topics Concern  . Not on file  Social  History Narrative   Single.    No children.   Works in Therapist, art   Enjoys DJ, traveling.    Social Determinants of Health   Financial Resource Strain: Not on file  Food Insecurity: Not on file  Transportation Needs: Not on file  Physical Activity: Not on file  Stress: Not on file  Social Connections: Not on file  Intimate Partner Violence: Not on file    Past Surgical History:  Procedure Laterality Date  . BRAIN SURGERY    . IR GENERIC HISTORICAL  02/12/2016   IR REMOVAL TUN ACCESS W/ PORT W/O FL MOD SED 02/12/2016 [name], MD [facility]    Family History  Problem Relation Age of Onset  . Pancreatic cancer Mother   . Cancer Mother   . Diabetes Paternal Grandmother   . Thyroid disease Paternal Grandmother     Allergies  Allergen Reactions  . Lisinopril Swelling    Swelling of lower lip while on lisinopril  . Penicillins Hives and Swelling    REACTION: rash and swelling  . Levaquin [Levofloxacin In D5w] Rash  . Tessalon [Benzonatate] Rash  . Aspirin Nausea Only    Current Outpatient Medications on File Prior to Visit  Medication Sig Dispense Refill  . albuterol (VENTOLIN HFA) 108 (90 Base) MCG/ACT inhaler INHALE 2 PUFFS BY MOUTH EVERY 4 TO 6 HOURS AS NEEDED FOR COUGH OR WHEEZING 42.5 g 0  . amLODipine (NORVASC) 10  MG tablet Take 1 tablet (10 mg total) by mouth daily. For blood pressure. 90 tablet 3  . EPINEPHrine 0.3 mg/0.3 mL IJ SOAJ injection Inject 0.3 mLs (0.3 mg total) into the muscle as needed for anaphylaxis. 1 each 2  . esomeprazole (NEXIUM) 40 MG capsule TAKE ONE CAPSULE BY MOUTH ONCE DAILY AS DIRECTED (Patient taking differently: Take 40 mg by mouth daily.) 30 capsule 0  . fexofenadine (ALLEGRA) 180 MG tablet Take 1 tablet (180 mg total) by mouth daily. 15 tablet 0  . FLOVENT HFA 110 MCG/ACT inhaler INHALE 2 PUFFS INTO THE LUNGS TWICE DAILY 12 g 2  . fluticasone (FLONASE) 50 MCG/ACT nasal spray SHAKE LIQUID AND USE 2 SPRAYS IN EACH NOSTRIL DAILY 16 g  5  . fluticasone-salmeterol (ADVAIR HFA) 230-21 MCG/ACT inhaler INHALE 2 PUFFS BY MOUTH TWICE DAILY 12 g 0  . glipiZIDE (GLUCOTROL XL) 5 MG 24 hr tablet TAKE 1 TABLET(5 MG) BY MOUTH DAILY WITH BREAKFAST FOR DIABETES 90 tablet 1  . hydrochlorothiazide (HYDRODIURIL) 25 MG tablet TAKE 1 TABLET BY MOUTH EVERY DAY FOR BLOOD PRESSURE 90 tablet 3  . LANCETS MICRO THIN 33G MISC USE TO TEST BLOOD SUGAR ONCE A DAY 100 each 2  . levothyroxine (SYNTHROID) 150 MCG tablet TAKE 1 TABLET BY MOUTH DAILY WITH AN EXTRA 1/2 TABLET ONCE WEEKLY 96 tablet 2  . metFORMIN (GLUCOPHAGE-XR) 500 MG 24 hr tablet TAKE 2 TABLETS BY MOUTH IN THE MORNING AND 2 TABLETS IN THE EVENING FOR DIABETES 360 tablet 0  . montelukast (SINGULAIR) 10 MG tablet TAKE 1 TABLET(10 MG) BY MOUTH DAILY 90 tablet 0  . Multiple Vitamin (MULTIVITAMIN) tablet Take 1 tablet by mouth daily.    . Olopatadine HCl 0.2 % SOLN Place 1 drop into both eyes daily as needed (itchy/watery eyes). 2.5 mL 5  . ONE TOUCH ULTRA TEST test strip TEST BLOOD GLUCOSE THREE TIMES DAILY AS DIRECTED 100 each 2  . PAZEO 0.7 % SOLN Place 1 drop into both eyes daily. 1 Bottle 5  . potassium chloride SA (KLOR-CON) 20 MEQ tablet TAKE 1 TABLET(20 MEQ) BY MOUTH DAILY 90 tablet 3  . rosuvastatin (CRESTOR) 5 MG tablet Take 1 tablet (5 mg total) by mouth daily. For cholesterol. 90 tablet 3  . TRIUMEQ 600-50-300 MG tablet TAKE 1 TABLET BY MOUTH DAILY 90 tablet 1  . XOLAIR 150 MG injection INJECT 300 MG UNDER THE SKIN EVERY TWO WEEKS 4 each 11   Current Facility-Administered Medications on File Prior to Visit  Medication Dose Route Frequency Provider Last Rate Last Admin  . omalizumab Arvid Right) injection 300 mg  300 mg Subcutaneous Q14 Days Garnet Sierras, DO   300 mg at 10/26/20 1506    BP 136/80   Pulse 91   Temp 97.8 F (36.6 C) (Temporal)   Ht 6\' 1"  (1.854 m)   Wt 300 lb (136.1 kg)   SpO2 97%   BMI 39.58 kg/m  Objective:   Physical Exam HENT:     Right Ear: Tympanic membrane  and ear canal normal.     Left Ear: Tympanic membrane and ear canal normal.     Nose: Nose normal.     Right Sinus: No maxillary sinus tenderness or frontal sinus tenderness.     Left Sinus: No maxillary sinus tenderness or frontal sinus tenderness.  Eyes:     Conjunctiva/sclera: Conjunctivae normal.     Pupils: Pupils are equal, round, and reactive to light.  Neck:     Thyroid:  No thyromegaly.     Vascular: No carotid bruit.  Cardiovascular:     Rate and Rhythm: Normal rate and regular rhythm.     Heart sounds: Normal heart sounds.  Pulmonary:     Effort: Pulmonary effort is normal.     Breath sounds: Normal breath sounds. No wheezing or rales.  Abdominal:     General: Bowel sounds are normal.     Palpations: Abdomen is soft.     Tenderness: There is no abdominal tenderness.  Musculoskeletal:     Cervical back: Neck supple.     Comments: Decrease in ROM to right knee  Skin:    General: Skin is warm and dry.  Neurological:     Mental Status: He is alert and oriented to person, place, and time.     Cranial Nerves: No cranial nerve deficit.     Deep Tendon Reflexes: Reflexes are normal and symmetric.           Assessment & Plan:      This visit occurred during the SARS-CoV-2 public health emergency.  Safety protocols were in place, including screening questions prior to the visit, additional usage of staff PPE, and extensive cleaning of exam room while observing appropriate contact time as indicated for disinfecting solutions.

## 2020-10-29 NOTE — Assessment & Plan Note (Signed)
Repeat renal function pending.

## 2020-10-29 NOTE — Assessment & Plan Note (Signed)
No wheezing on exam, appears controlled.  Continue Advair 230-21 mcg, Xolair injections every 2 weeks.

## 2020-10-29 NOTE — Assessment & Plan Note (Signed)
Repeat TSH pending. Continue levothyroxine 150 mcg.

## 2020-10-29 NOTE — Patient Instructions (Signed)
Stop by the lab prior to leaving today. I will notify you of your results once received.   It was a pleasure to see you today!  

## 2020-10-29 NOTE — Assessment & Plan Note (Signed)
Compliant to Glipizide XL 5 mg and Metformin XR 1000 mg BID. Continue same. Repeat A1C pending.

## 2020-11-02 ENCOUNTER — Telehealth: Payer: Self-pay | Admitting: Primary Care

## 2020-11-02 ENCOUNTER — Telehealth: Payer: Self-pay

## 2020-11-02 NOTE — Telephone Encounter (Signed)
Steven [name] ci wanted to check on the clearance form for surgery due to he is having surgery on 5/12.  Please advise

## 2020-11-02 NOTE — Telephone Encounter (Signed)
[facility] calling to confirm receipt of surgical clearance. Forms placed in MD in triage for review.  Eugenia Mcalpine

## 2020-11-03 ENCOUNTER — Ambulatory Visit: Payer: PPO | Admitting: Primary Care

## 2020-11-03 ENCOUNTER — Telehealth: Payer: Self-pay | Admitting: Primary Care

## 2020-11-03 NOTE — Telephone Encounter (Signed)
Joellen, please call patient and review results. He has not responded to my message on My Chart. Need to get his potassium under control prior to surgery.

## 2020-11-04 NOTE — Telephone Encounter (Signed)
Called not able to leave v/m

## 2020-11-06 NOTE — Telephone Encounter (Signed)
Left message to return call to our office.  

## 2020-11-06 NOTE — Telephone Encounter (Signed)
See open phone note

## 2020-11-09 ENCOUNTER — Other Ambulatory Visit: Payer: Self-pay

## 2020-11-09 ENCOUNTER — Ambulatory Visit (INDEPENDENT_AMBULATORY_CARE_PROVIDER_SITE_OTHER): Payer: PPO

## 2020-11-09 ENCOUNTER — Other Ambulatory Visit (INDEPENDENT_AMBULATORY_CARE_PROVIDER_SITE_OTHER): Payer: PPO

## 2020-11-09 DIAGNOSIS — E890 Postprocedural hypothyroidism: Secondary | ICD-10-CM | POA: Diagnosis not present

## 2020-11-09 DIAGNOSIS — J4550 Severe persistent asthma, uncomplicated: Secondary | ICD-10-CM | POA: Diagnosis not present

## 2020-11-09 LAB — T4, FREE: Free T4: 1.06 ng/dL (ref 0.60–1.60)

## 2020-11-09 LAB — TSH: TSH: 4.03 u[IU]/mL (ref 0.35–4.50)

## 2020-11-11 DIAGNOSIS — I10 Essential (primary) hypertension: Secondary | ICD-10-CM

## 2020-11-11 DIAGNOSIS — E876 Hypokalemia: Secondary | ICD-10-CM

## 2020-11-11 NOTE — Telephone Encounter (Signed)
Signatures obtained for surgical clearance form and faxed back to [facility] and signed form placed in batch scanning.  Eugenia Mcalpine

## 2020-11-12 ENCOUNTER — Ambulatory Visit (INDEPENDENT_AMBULATORY_CARE_PROVIDER_SITE_OTHER): Payer: PPO | Admitting: Endocrinology

## 2020-11-12 ENCOUNTER — Encounter: Payer: Self-pay | Admitting: Endocrinology

## 2020-11-12 ENCOUNTER — Other Ambulatory Visit: Payer: Self-pay

## 2020-11-12 VITALS — BP 128/88 | HR 74 | Ht 73.0 in | Wt 301.4 lb

## 2020-11-12 DIAGNOSIS — E890 Postprocedural hypothyroidism: Secondary | ICD-10-CM | POA: Diagnosis not present

## 2020-11-12 MED ORDER — LEVOTHYROXINE SODIUM 175 MCG PO TABS: 175.0000 ug | ORAL_TABLET | Freq: Every day | ORAL | 3 refills | Status: DC

## 2020-11-12 NOTE — Patient Instructions (Signed)
Take 2 pills on Sundays

## 2020-11-12 NOTE — Telephone Encounter (Signed)
See my chart message for further documentation.

## 2020-11-12 NOTE — Progress Notes (Signed)
Patient ID: Steven [name], male   DOB: 11/23/1980, 40 y.o.   MRN: [id number]     Reason for Appointment:  Hypothyroidism, follow-up visit    History of Present Illness:   HYPOTHYROIDISM  was first diagnosed around 2013  He previously had hyperthyroidism which was treated with radioactive iodine in 05/2011  He had been taking Synthroid 200 mcg daily initially and it was then reduced to 175 mcg Because of persistently low TSH the dose was again reduced in 08/2014 down to 150 g  Since 5/18 He has been taking 7-1/2 tablets a week of the 150 mcg levothyroxine Dose was increased by half tablet weekly when TSH was 4.6  He is returning for his annual follow-up  He does not feel tired or sluggish and no significant weight change  He has been regular with his levothyroxine consistently daily on empty stomach, usually an hour before eating Not taking any multivitamins in the morning but later at least a couple hours   TSH is now high normal at 4 compared to 1.0 He has not missed any doses lately  Wt Readings from Last 3 Encounters:  11/12/20 (!) 301 lb 6.4 oz (136.7 kg)  10/29/20 300 lb (136.1 kg)  10/20/20 300 lb (136.1 kg)    Labs:  Lab Results  Component Value Date   TSH 4.03 11/09/2020   TSH 1.04 10/29/2020   TSH 2.61 11/28/2019   FREET4 1.06 11/09/2020   FREET4 1.32 11/28/2019   FREET4 1.33 11/21/2018    OTHER active problems including diabetes are discussed in review of systems:    Past Medical History:  Diagnosis Date  . Asthma   . Cancer ([place])   . Colitis 05/27/2011  . Diabetes mellitus without complication ([place])   . 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 HTN (hypertension)   . Kaposi's sarcoma   . Syphilis 03/23/09-   1:2    Past Surgical History:  Procedure Laterality Date  . BRAIN SURGERY    . IR GENERIC HISTORICAL  02/12/2016   IR REMOVAL TUN ACCESS W/ PORT W/O FL MOD SED 02/12/2016 [name], MD [facility]    Family History   Problem Relation Age of Onset  . Pancreatic cancer Mother   . Cancer Mother   . Diabetes Paternal Grandmother   . Thyroid disease Paternal Grandmother     Social History:  reports that he has never smoked. He has never used smokeless tobacco. He reports current alcohol use. He reports that he does not use drugs.  Allergies:  Allergies  Allergen Reactions  . Lisinopril Swelling    Swelling of lower lip while on lisinopril  . Penicillins Hives and Swelling    REACTION: rash and swelling  . Levaquin [Levofloxacin In D5w] Rash  . Tessalon [Benzonatate] Rash  . Aspirin Nausea Only    Allergies as of 11/12/2020      Reactions   Lisinopril Swelling   Swelling of lower lip while on lisinopril   Penicillins Hives, Swelling   REACTION: rash and swelling   Levaquin [levofloxacin In D5w] Rash   Tessalon [benzonatate] Rash   Aspirin Nausea Only      Medication List       Accurate as of Nov 12, 2020  9:40 AM. If you have any questions, ask your nurse or doctor.        Advair HFA 230-21 MCG/ACT inhaler Generic drug: fluticasone-salmeterol INHALE 2 PUFFS BY MOUTH TWICE DAILY   albuterol  108 (90 Base) MCG/ACT inhaler Commonly known as: VENTOLIN HFA INHALE 2 PUFFS BY MOUTH EVERY 4 TO 6 HOURS AS NEEDED FOR COUGH OR WHEEZING   amLODipine 10 MG tablet Commonly known as: NORVASC Take 1 tablet (10 mg total) by mouth daily. For blood pressure.   EPINEPHrine 0.3 mg/0.3 mL Soaj injection Commonly known as: EPI-PEN Inject 0.3 mLs (0.3 mg total) into the muscle as needed for anaphylaxis.   esomeprazole 40 MG capsule Commonly known as: NEXIUM TAKE ONE CAPSULE BY MOUTH ONCE DAILY AS DIRECTED What changed: See the new instructions.   fexofenadine 180 MG tablet Commonly known as: ALLEGRA Take 1 tablet (180 mg total) by mouth daily.   Flovent HFA 110 MCG/ACT inhaler Generic drug: fluticasone INHALE 2 PUFFS INTO THE LUNGS TWICE DAILY   fluticasone 50 MCG/ACT nasal spray Commonly  known as: FLONASE SHAKE LIQUID AND USE 2 SPRAYS IN EACH NOSTRIL DAILY   glipiZIDE 5 MG 24 hr tablet Commonly known as: GLUCOTROL XL TAKE 1 TABLET(5 MG) BY MOUTH DAILY WITH BREAKFAST FOR DIABETES   hydrochlorothiazide 25 MG tablet Commonly known as: HYDRODIURIL TAKE 1 TABLET BY MOUTH EVERY DAY FOR BLOOD PRESSURE   Lancets Micro Thin 33G Misc USE TO TEST BLOOD SUGAR ONCE A DAY   levothyroxine 150 MCG tablet Commonly known as: SYNTHROID TAKE 1 TABLET BY MOUTH DAILY WITH AN EXTRA 1/2 TABLET ONCE WEEKLY   metFORMIN 500 MG 24 hr tablet Commonly known as: GLUCOPHAGE-XR TAKE 2 TABLETS BY MOUTH IN THE MORNING AND 2 TABLETS IN THE EVENING FOR DIABETES   montelukast 10 MG tablet Commonly known as: SINGULAIR TAKE 1 TABLET(10 MG) BY MOUTH DAILY   multivitamin tablet Take 1 tablet by mouth daily.   ONE TOUCH ULTRA TEST test strip Generic drug: glucose blood TEST BLOOD GLUCOSE THREE TIMES DAILY AS DIRECTED   Pazeo 0.7 % Soln Generic drug: Olopatadine HCl Place 1 drop into both eyes daily.   Olopatadine HCl 0.2 % Soln Place 1 drop into both eyes daily as needed (itchy/watery eyes).   potassium chloride SA 20 MEQ tablet Commonly known as: KLOR-CON TAKE 1 TABLET(20 MEQ) BY MOUTH DAILY   rosuvastatin 5 MG tablet Commonly known as: Crestor Take 1 tablet (5 mg total) by mouth daily. For cholesterol.   Triumeq 600-50-300 MG tablet Generic drug: abacavir-dolutegravir-lamiVUDine TAKE 1 TABLET BY MOUTH DAILY   Xolair 150 MG injection Generic drug: omalizumab INJECT 300 MG UNDER THE SKIN EVERY TWO WEEKS       Review of Systems:    DIABETES: He has followed with his PCP, his A1c recently checked:   Lab Results  Component Value Date   HGBA1C 6.7 (H) 10/29/2020   HGBA1C 6.1 (A) 05/28/2020   HGBA1C 9.0 (A) 12/26/2019   Lab Results  Component Value Date   MICROALBUR 3.1 (H) 12/26/2019   LDLCALC 45 09/14/2020   CREATININE 1.22 10/29/2020     HYPERTENSION:  history of  high blood pressure treated by PCP     BP Readings from Last 3 Encounters:  11/12/20 128/88  10/29/20 136/80  10/20/20 (!) 147/94        Examination:    BP 128/88 (BP Location: Left Arm, Patient Position: Sitting, Cuff Size: Normal)   Pulse 74   Ht 6\' 1"  (1.854 m)   Wt (!) 301 lb 6.4 oz (136.7 kg)   SpO2 97%   BMI 39.76 kg/m   Exam not indicated  Assessment/plan and recommendations:    Hypothyroidism, post radioactive iodine He  has been on a stable prescription using 150 mcg levothyroxine since 2018  He takes 7-1/2 tablets a week and has been on a stable dose He has not missed any doses but his TSH is trending higher Previously has taken as much as 200 mcg levothyroxine He does not think he is getting a different brand of generics but not clear if he has checked his  Since he is having high normal TSH he will now switch to 175 mcg levothyroxine and follow-up in 3 months  He will be cleared for knee surgery  There are no Patient Instructions on file for this visit.   Elayne Snare 11/12/2020, 9:40 AM    Note: This office note was prepared with Dragon voice recognition system technology. Any transcriptional errors that result from this process are unintentional.

## 2020-11-15 ENCOUNTER — Other Ambulatory Visit: Payer: Self-pay | Admitting: Allergy

## 2020-11-16 ENCOUNTER — Ambulatory Visit: Payer: PPO | Admitting: Endocrinology

## 2020-11-16 DIAGNOSIS — H52203 Unspecified astigmatism, bilateral: Secondary | ICD-10-CM | POA: Diagnosis not present

## 2020-11-16 DIAGNOSIS — E119 Type 2 diabetes mellitus without complications: Secondary | ICD-10-CM | POA: Diagnosis not present

## 2020-11-16 LAB — HM DIABETES EYE EXAM

## 2020-11-17 ENCOUNTER — Telehealth: Payer: Self-pay

## 2020-11-17 ENCOUNTER — Other Ambulatory Visit: Payer: Self-pay

## 2020-11-17 ENCOUNTER — Other Ambulatory Visit (INDEPENDENT_AMBULATORY_CARE_PROVIDER_SITE_OTHER): Payer: PPO

## 2020-11-17 DIAGNOSIS — E876 Hypokalemia: Secondary | ICD-10-CM

## 2020-11-17 LAB — POTASSIUM: Potassium: 3 mEq/L — ABNORMAL LOW (ref 3.5–5.1)

## 2020-11-17 MED ORDER — POTASSIUM CHLORIDE CRYS ER 20 MEQ PO TBCR
20.0000 meq | EXTENDED_RELEASE_TABLET | Freq: Two times a day (BID) | ORAL | 3 refills | Status: DC
Start: 2020-11-17 — End: 2022-01-30

## 2020-11-17 NOTE — Telephone Encounter (Signed)
Called patient l/m to call office.   Called ans spoke to [place] at ortho. Gave all information. After our phone conversation I have faxed last office note, labs, and ekg. She is also aware that we have been trying to reach patient from last appointment and were not able to reach today regarding labs.

## 2020-11-17 NOTE — Addendum Note (Signed)
Addended by: Pleas [name] on: 11/17/2020 05:17 PM   Modules accepted: Orders

## 2020-11-17 NOTE — Telephone Encounter (Addendum)
Potassium level reviewed and is the same at 3.0.  Please call patient: how much potassium is he taking? His potassium level is unchanged.  Please call [facility]: We will fax form to clear patient but attention to potassium needs to be noted. Please also mention that we've been trying to contact patient about his potassium level since his visit in April.   Potassium level of 3.0 on oral supplementation. Form placed in Joellen's inbox.

## 2020-11-17 NOTE — Telephone Encounter (Signed)
Wells Guiles from [facility] calling to check on surgical clearance form. I advised her that patient is coming in this morning to re check labs. Surgery is scheduled for 11/19/20.  Their office would need to get the form and hear back on clearance by lunch time tomorrow please in order to proceed with scheduled date. Rebecca's number if have questions is [phone] Fax: [phone]

## 2020-11-17 NOTE — Addendum Note (Signed)
Addended by: Pleas [name] on: 11/17/2020 07:08 AM   Modules accepted: Orders

## 2020-11-19 ENCOUNTER — Other Ambulatory Visit: Payer: Self-pay | Admitting: Primary Care

## 2020-11-19 ENCOUNTER — Other Ambulatory Visit: Payer: Self-pay

## 2020-11-19 DIAGNOSIS — D1723 Benign lipomatous neoplasm of skin and subcutaneous tissue of right leg: Secondary | ICD-10-CM | POA: Diagnosis not present

## 2020-11-19 DIAGNOSIS — M948X6 Other specified disorders of cartilage, lower leg: Secondary | ICD-10-CM | POA: Diagnosis not present

## 2020-11-19 DIAGNOSIS — M2341 Loose body in knee, right knee: Secondary | ICD-10-CM | POA: Diagnosis not present

## 2020-11-19 DIAGNOSIS — D1720 Benign lipomatous neoplasm of skin and subcutaneous tissue of unspecified limb: Secondary | ICD-10-CM | POA: Diagnosis not present

## 2020-11-19 DIAGNOSIS — G8918 Other acute postprocedural pain: Secondary | ICD-10-CM | POA: Diagnosis not present

## 2020-11-19 DIAGNOSIS — I10 Essential (primary) hypertension: Secondary | ICD-10-CM

## 2020-11-19 DIAGNOSIS — E119 Type 2 diabetes mellitus without complications: Secondary | ICD-10-CM

## 2020-11-19 NOTE — Telephone Encounter (Signed)
Left message to return call to our office.  

## 2020-11-24 ENCOUNTER — Other Ambulatory Visit: Payer: Self-pay | Admitting: Allergy

## 2020-11-24 NOTE — Telephone Encounter (Signed)
We have been communicating via My Chart, thanks.

## 2020-11-26 ENCOUNTER — Other Ambulatory Visit: Payer: Self-pay | Admitting: Allergy

## 2020-11-28 ENCOUNTER — Other Ambulatory Visit: Payer: Self-pay | Admitting: Infectious Diseases

## 2020-11-28 DIAGNOSIS — B20 Human immunodeficiency virus [HIV] disease: Secondary | ICD-10-CM

## 2020-11-30 ENCOUNTER — Encounter: Payer: Self-pay | Admitting: Primary Care

## 2020-11-30 ENCOUNTER — Ambulatory Visit: Payer: Self-pay

## 2020-12-04 ENCOUNTER — Other Ambulatory Visit: Payer: PPO

## 2020-12-08 ENCOUNTER — Ambulatory Visit: Payer: PPO | Admitting: Endocrinology

## 2020-12-16 DIAGNOSIS — Z9889 Other specified postprocedural states: Secondary | ICD-10-CM | POA: Diagnosis not present

## 2020-12-25 ENCOUNTER — Ambulatory Visit: Payer: PPO | Admitting: Podiatry

## 2020-12-29 ENCOUNTER — Other Ambulatory Visit: Payer: Self-pay | Admitting: Primary Care

## 2020-12-29 DIAGNOSIS — E119 Type 2 diabetes mellitus without complications: Secondary | ICD-10-CM

## 2021-01-20 DIAGNOSIS — Z9889 Other specified postprocedural states: Secondary | ICD-10-CM | POA: Diagnosis not present

## 2021-01-22 ENCOUNTER — Other Ambulatory Visit: Payer: Self-pay

## 2021-01-22 MED ORDER — GLUCOSE BLOOD VI STRP
ORAL_STRIP | 3 refills | Status: DC
Start: 2021-01-22 — End: 2023-03-05

## 2021-01-27 ENCOUNTER — Other Ambulatory Visit: Payer: Self-pay | Admitting: Allergy

## 2021-02-02 NOTE — Progress Notes (Signed)
Steven [name] [id number] Dept: [phone]  FOLLOW UP NOTE  Patient ID: Steven [name], male    DOB: 1981-01-31  Age: 40 y.o. MRN: [id number] Date of Office Visit: 02/03/2021  Assessment  Chief Complaint: Asthma (Says he is doing well. Rescue inhaler is used rarely maybe 2 times a month at the most. /ACT:23) and Allergic Rhinitis  (Only flares if the heat is bad)  HPI Steven [name] is a 41 year old male who presents to the clinic for follow-up visit.  He was last seen in this clinic on 01/08/2020 by Dr. 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   At today's visit, he reports his asthma has been well controlled with no shortness of breath, cough, or wheeze with activity or rest.  He continues montelukast 10 mg once a day, Advair 230-2 puffs twice a day and uses albuterol once every 2 or 3 weeks with relief of symptoms.  He has recently had knee surgery and has been unable to receive his Xolair.  Prior to receiving his injection today, his last injection was on 11/09/2020.  Allergic rhinitis is reported as moderately well controlled with an the main symptom of nasal congestion occurring occasionally at night.  He continues Allegra 180 mg once a day and uses Flonase as needed.  Allergic conjunctivitis is reported as moderately well controlled with occasional red and itchy eyes for which he uses olopatadine with relief of symptoms.  Reflux is reported as well controlled with no symptoms including heartburn or vomiting and he continues Nexium 40 mg once a day.  He continues to avoid shellfish and mushroom with no accidental ingestion or EpiPen use since his last visit to this clinic.  He reports his HIV is currently at undetectable levels.  His current medications are listed in the chart.   Drug Allergies:  Allergies  Allergen Reactions   Lisinopril Swelling    Swelling of lower lip while on  lisinopril   Penicillins Hives and Swelling    REACTION: rash and swelling   Levaquin [Levofloxacin In D5w] Rash   Tessalon [Benzonatate] Rash   Aspirin Nausea Only    Physical Exam: BP 122/76   Pulse 88   Temp 98 F (36.7 C) (Temporal)   Resp 16   Ht 6' 1.5" (1.867 m)   Wt 285 lb 9.6 oz (129.5 kg)   SpO2 98%   BMI 37.17 kg/m    Physical Exam Vitals reviewed.  Constitutional:      Appearance: Normal appearance.  HENT:     Head: Normocephalic and atraumatic.     Right Ear: Tympanic membrane normal.     Left Ear: Tympanic membrane normal.     Nose:     Comments: Bilateral nares slightly erythematous with clear nasal drainage noted.  Pharynx normal.  Ears normal.  Eyes normal.    Mouth/Throat:     Pharynx: Oropharynx is clear.  Eyes:     Conjunctiva/sclera: Conjunctivae normal.  Cardiovascular:     Rate and Rhythm: Normal rate and regular rhythm.     Heart sounds: Normal heart sounds. No murmur heard. Pulmonary:     Effort: Pulmonary effort is normal.     Breath sounds: Normal breath sounds.     Comments: Lungs clear to auscultation Musculoskeletal:        General: Normal range of motion.     Cervical back: Normal range of motion and neck supple.  Skin:  General: Skin is warm and dry.  Neurological:     Mental Status: He is alert and oriented to person, place, and time.  Psychiatric:        Mood and Affect: Mood normal.        Behavior: Behavior normal.        Thought Content: Thought content normal.        Judgment: Judgment normal.    Diagnostics: FVC 2.25, FEV1 1.54.  Predicted FVC 4.87, predicted FEV1 3.91.  Spirometry indicates severe obstruction and restriction. Post bronchodilator therapy FVC 2.34, FEV1 1.68. Post bronchodilator spirometry indicates restriction. This is consistent with his previous spirometry readings.   Assessment and Plan: 1. Severe persistent asthma without complication   2. Seasonal and perennial allergic rhinoconjunctivitis   3.  Food allergy   4. Gastroesophageal reflux disease, unspecified whether esophagitis present   5. HIV infection, unspecified symptom status ([place])     Meds ordered this encounter  Medications   albuterol (VENTOLIN HFA) 108 (90 Base) MCG/ACT inhaler    Sig: INHALE 2 PUFFS BY MOUTH EVERY 4 TO 6 HOURS AS NEEDED FOR COUGH OR WHEEZING    Dispense:  42.5 g    Refill:  0   EPINEPHrine 0.3 mg/0.3 mL IJ SOAJ injection    Sig: Inject 0.3 mg into the muscle as needed for anaphylaxis.    Dispense:  1 each    Refill:  2   fluticasone (FLOVENT HFA) 110 MCG/ACT inhaler    Sig: Inhale 2 puffs into the lungs 2 (two) times daily.    Dispense:  12 g    Refill:  5   fluticasone (FLONASE) 50 MCG/ACT nasal spray    Sig: SHAKE LIQUID AND USE 2 SPRAYS IN EACH NOSTRIL DAILY    Dispense:  16 g    Refill:  5   fluticasone-salmeterol (ADVAIR HFA) 230-21 MCG/ACT inhaler    Sig: INHALE 2 PUFFS BY MOUTH TWICE DAILY    Dispense:  12 g    Refill:  0    Courtesy refill.   montelukast (SINGULAIR) 10 MG tablet    Sig: TAKE 1 TABLET(10 MG) BY MOUTH DAILY    Dispense:  90 tablet    Refill:  2    **Patient requests 90 days supply**   Olopatadine HCl 0.2 % SOLN    Sig: Place 1 drop into both eyes daily as needed (itchy/watery eyes).    Dispense:  2.5 mL    Refill:  5     Patient Instructions  Asthma Continue montelukast 10 mg once a day to prevent cough or wheeze Continue Advair 230-2 puffs twice a day with a spacer to prevent cough or wheeze Continue albuterol 2 puffs once every 4 hours as needed for cough or wheeze You may use albuterol 2 puffs 5 to 15 minutes before activity to decrease cough or wheeze Continue Xolair injections 300 mg once every 2 weeks to help control your asthma  Allergic rhinitis Continue allergen avoidance measures directed toward grass pollen, weed pollen, mold, dust mite, cat, and dog as listed below Continue Allegra 180 mg once a day as needed for runny nose or itch Continue  Flonase 2 sprays in each nostril once a day as needed for stuffy nose.  In the right nostril, point the applicator out toward the right ear. In the left nostril, point the applicator out toward the left ear Consider saline nasal rinses as needed for nasal symptoms. Use this before any medicated nasal sprays for  best result  Allergic conjunctivitis Continue olopatadine 1 drop in each eye once a day as needed for red or itchy eyes  Reflux Continue dietary and lifestyle modifications as listed below Continue Nexium 40 mg once a day as needed for reflux  Food allergy Continue to avoid shellfish and mushroom. In case of an allergic reaction, take Benadryl 50 mg every 4 hours, and if life-threatening symptoms occur, inject with EpiPen 0.3 mg.  Call the clinic if this treatment plan is not working well for you.  Follow up in 6 months or sooner if needed.   Return in about 6 months (around 08/06/2021), or if symptoms worsen or fail to improve.    Thank you for the opportunity to care for this patient.  Please do not hesitate to contact me with questions.  Gareth Morgan, FNP [facility]

## 2021-02-02 NOTE — Patient Instructions (Addendum)
Asthma Continue montelukast 10 mg once a day to prevent cough or wheeze Continue Advair 230-2 puffs twice a day with a spacer to prevent cough or wheeze Continue albuterol 2 puffs once every 4 hours as needed for cough or wheeze You may use albuterol 2 puffs 5 to 15 minutes before activity to decrease cough or wheeze Continue Xolair injections 300 mg once every 2 weeks to help control your asthma  Allergic rhinitis Continue allergen avoidance measures directed toward grass pollen, weed pollen, mold, dust mite, cat, and dog as listed below Continue Allegra 180 mg once a day as needed for runny nose or itch Continue Flonase 2 sprays in each nostril once a day as needed for stuffy nose.  In the right nostril, point the applicator out toward the right ear. In the left nostril, point the applicator out toward the left ear Consider saline nasal rinses as needed for nasal symptoms. Use this before any medicated nasal sprays for best result  Allergic conjunctivitis Continue olopatadine 1 drop in each eye once a day as needed for red or itchy eyes  Reflux Continue dietary and lifestyle modifications as listed below Continue Nexium 40 mg once a day as needed for reflux  Food allergy Continue to avoid shellfish and mushroom. In case of an allergic reaction, take Benadryl 50 mg every 4 hours, and if life-threatening symptoms occur, inject with EpiPen 0.3 mg.  Call the clinic if this treatment plan is not working well for you.  Follow up in 6 months or sooner if needed.   Lifestyle Changes for Controlling GERD When you have GERD, stomach acid feels as if it's backing up toward your mouth. Whether or not you take medication to control your GERD, your symptoms can often be improved with lifestyle changes.   Raise Your Head Reflux is more likely to strike when you're lying down flat, because stomach fluid can flow backward more easily. Raising the head of your bed 4-6 inches can help. To do  this: Slide blocks or books under the legs at the head of your bed. Or, place a wedge under the mattress. Many foam stores can make a suitable wedge for you. The wedge should run from your waist to the top of your head. Don't just prop your head on several pillows. This increases pressure on your stomach. It can make GERD worse.  Watch Your Eating Habits Certain foods may increase the acid in your stomach or relax the lower esophageal sphincter, making GERD more likely. It's best to avoid the following: Coffee, tea, and carbonated drinks (with and without caffeine) Fatty, fried, or spicy food Mint, chocolate, onions, and tomatoes Any other foods that seem to irritate your stomach or cause you pain  Relieve the Pressure Eat smaller meals, even if you have to eat more often. Don't lie down right after you eat. Wait a few hours for your stomach to empty. Avoid tight belts and tight-fitting clothes. Lose excess weight.  Tobacco and Alcohol Avoid smoking tobacco and drinking alcohol. They can make GERD symptoms worse.  Reducing Pollen Exposure The American Academy of Allergy, Asthma and Immunology suggests the following steps to reduce your exposure to pollen during allergy seasons. Do not hang sheets or clothing out to dry; pollen may collect on these items. Do not mow lawns or spend time around freshly cut grass; mowing stirs up pollen. Keep windows closed at night.  Keep car windows closed while driving. Minimize morning activities outdoors, a time when pollen counts  are usually at their highest. Stay indoors as much as possible when pollen counts or humidity is high and on windy days when pollen tends to remain in the air longer. Use air conditioning when possible.  Many air conditioners have filters that trap the pollen spores. Use a HEPA room air filter to remove pollen form the indoor air you breathe.  Control of Mold Allergen Mold and fungi can grow on a variety of surfaces  provided certain temperature and moisture conditions exist.  Outdoor molds grow on plants, decaying vegetation and soil.  The major outdoor mold, Alternaria and Cladosporium, are found in very high numbers during hot and dry conditions.  Generally, a late Summer - Fall peak is seen for common outdoor fungal spores.  Rain will temporarily lower outdoor mold spore count, but counts rise rapidly when the rainy period ends.  The most important indoor molds are Aspergillus and Penicillium.  Dark, humid and poorly ventilated basements are ideal sites for mold growth.  The next most common sites of mold growth are the bathroom and the kitchen.  Outdoor [employer] Use air conditioning and keep windows closed Avoid exposure to decaying vegetation. Avoid leaf raking. Avoid grain handling. Consider wearing a face mask if working in moldy areas.  Indoor Mold Control Maintain humidity below 50%. Clean washable surfaces with 5% bleach solution. Remove sources e.g. Contaminated carpets.   Control of Dust Mite Allergen Dust mites play a major role in allergic asthma and rhinitis. They occur in environments with high humidity wherever human skin is found. Dust mites absorb humidity from the atmosphere (ie, they do not drink) and feed on organic matter (including shed human and animal skin). Dust mites are a microscopic type of insect that you cannot see with the naked eye. High levels of dust mites have been detected from mattresses, pillows, carpets, upholstered furniture, bed covers, clothes, soft toys and any woven material. The principal allergen of the dust mite is found in its feces. A gram of dust may contain 1,000 mites and 250,000 fecal particles. Mite antigen is easily measured in the air during house cleaning activities. Dust mites do not bite and do not cause harm to humans, other than by triggering allergies/asthma.  Ways to decrease your exposure to dust mites in your home:  1. Encase mattresses,  box springs and pillows with a mite-impermeable barrier or cover  2. Wash sheets, blankets and drapes weekly in hot water (130 F) with detergent and dry them in a dryer on the hot setting.  3. Have the room cleaned frequently with a vacuum cleaner and a damp dust-mop. For carpeting or rugs, vacuuming with a vacuum cleaner equipped with a high-efficiency particulate air (HEPA) filter. The dust mite allergic individual should not be in a room which is being cleaned and should wait 1 hour after cleaning before going into the room.  4. Do not sleep on upholstered furniture (eg, couches).  5. If possible removing carpeting, upholstered furniture and drapery from the home is ideal. Horizontal blinds should be eliminated in the rooms where the person spends the most time (bedroom, study, television room). Washable vinyl, roller-type shades are optimal.  6. Remove all non-washable stuffed toys from the bedroom. Wash stuffed toys weekly like sheets and blankets above.  7. Reduce indoor humidity to less than 50%. Inexpensive humidity monitors can be purchased at most hardware stores. Do not use a humidifier as can make the problem worse and are not recommended.  Control of Dog  or Cat Allergen Avoidance is the best way to manage a dog or cat allergy. If you have a dog or cat and are allergic to dog or cats, consider removing the dog or cat from the home. If you have a dog or cat but don't want to find it a new home, or if your family wants a pet even though someone in the household is allergic, here are some strategies that may help keep symptoms at bay:  Keep the pet out of your bedroom and restrict it to only a few rooms. Be advised that keeping the dog or cat in only one room will not limit the allergens to that room. Don't pet, hug or kiss the dog or cat; if you do, wash your hands with soap and water. High-efficiency particulate air (HEPA) cleaners run continuously in a bedroom or living room can  reduce allergen levels over time. Regular use of a high-efficiency vacuum cleaner or a central vacuum can reduce allergen levels. Giving your dog or cat a bath at least once a week can reduce airborne allergen.

## 2021-02-03 ENCOUNTER — Other Ambulatory Visit: Payer: Self-pay

## 2021-02-03 ENCOUNTER — Encounter: Payer: Self-pay | Admitting: Family Medicine

## 2021-02-03 ENCOUNTER — Ambulatory Visit (INDEPENDENT_AMBULATORY_CARE_PROVIDER_SITE_OTHER): Payer: PPO | Admitting: Family Medicine

## 2021-02-03 ENCOUNTER — Ambulatory Visit: Payer: Self-pay | Admitting: *Deleted

## 2021-02-03 VITALS — BP 122/76 | HR 88 | Temp 98.0°F | Resp 16 | Ht 73.5 in | Wt 285.6 lb

## 2021-02-03 DIAGNOSIS — J4550 Severe persistent asthma, uncomplicated: Secondary | ICD-10-CM | POA: Diagnosis not present

## 2021-02-03 DIAGNOSIS — Z91018 Allergy to other foods: Secondary | ICD-10-CM

## 2021-02-03 DIAGNOSIS — J302 Other seasonal allergic rhinitis: Secondary | ICD-10-CM

## 2021-02-03 DIAGNOSIS — B20 Human immunodeficiency virus [HIV] disease: Secondary | ICD-10-CM

## 2021-02-03 DIAGNOSIS — H1010 Acute atopic conjunctivitis, unspecified eye: Secondary | ICD-10-CM

## 2021-02-03 DIAGNOSIS — K219 Gastro-esophageal reflux disease without esophagitis: Secondary | ICD-10-CM

## 2021-02-03 MED ORDER — OLOPATADINE HCL 0.2 % OP SOLN: 1.0000 [drp] | Freq: Every day | OPHTHALMIC | 5 refills | Status: DC | PRN

## 2021-02-03 MED ORDER — FLUTICASONE PROPIONATE 50 MCG/ACT NA SUSP: NASAL | 5 refills | Status: DC

## 2021-02-03 MED ORDER — EPINEPHRINE 0.3 MG/0.3ML IJ SOAJ: 0.3000 mg | INTRAMUSCULAR | 2 refills | Status: DC | PRN

## 2021-02-03 MED ORDER — MONTELUKAST SODIUM 10 MG PO TABS: ORAL_TABLET | ORAL | 2 refills | Status: DC

## 2021-02-03 MED ORDER — FLUTICASONE PROPIONATE HFA 110 MCG/ACT IN AERO: 2.0000 | INHALATION_SPRAY | Freq: Two times a day (BID) | RESPIRATORY_TRACT | 5 refills | Status: DC

## 2021-02-03 MED ORDER — ADVAIR HFA 230-21 MCG/ACT IN AERO: INHALATION_SPRAY | RESPIRATORY_TRACT | 0 refills | Status: DC

## 2021-02-03 MED ORDER — ALBUTEROL SULFATE HFA 108 (90 BASE) MCG/ACT IN AERS: INHALATION_SPRAY | RESPIRATORY_TRACT | 0 refills | Status: DC

## 2021-02-04 ENCOUNTER — Telehealth: Payer: Self-pay | Admitting: Primary Care

## 2021-02-04 NOTE — Telephone Encounter (Signed)
Mr. Taber called in wanted to know about getting a new prescription for a test meter and strips for insurance purposes. He uses [facility] [address]

## 2021-02-04 NOTE — Telephone Encounter (Signed)
He returning phone call, he stated the freestyle precision neo, and the one touch are covered, and it doesn't matter which one.

## 2021-02-04 NOTE — Telephone Encounter (Signed)
Left message on verified VM asking pt if he knew which meter is covered on his insurance. Asked that he call and find out and then let us know by calling or sending a MyChart message.

## 2021-02-05 NOTE — Telephone Encounter (Signed)
It looks like we just faxed a prescription for the glucometer kit with supplies to his pharmacy on 01/22/21.  The fax is a generic prescription so that the pharmacy can choose the brand that will be covered by insurance.  Can you find out what is going on?

## 2021-02-08 NOTE — Telephone Encounter (Signed)
Pt called back and stated he spoke with the pharmacy. They are needing the prescription to state "OneTouch Meter" in order for them to fill.

## 2021-02-08 NOTE — Telephone Encounter (Signed)
Called patient let know that we have called in that to his pharmacy on 7/15. He will see if ready to pick up. If any issues will let our office know.

## 2021-02-09 NOTE — Telephone Encounter (Signed)
See my chart sent to [facility].

## 2021-02-09 NOTE — Telephone Encounter (Signed)
Yes, please send in script.

## 2021-02-09 NOTE — Telephone Encounter (Signed)
Ok to resend script

## 2021-02-11 NOTE — Telephone Encounter (Signed)
[name] called in and stated that he needs the prescription for the meter, and its the [facility] , he has the strips but no meter.

## 2021-02-15 ENCOUNTER — Other Ambulatory Visit: Payer: Self-pay | Admitting: Endocrinology

## 2021-02-15 DIAGNOSIS — E890 Postprocedural hypothyroidism: Secondary | ICD-10-CM

## 2021-02-16 ENCOUNTER — Inpatient Hospital Stay: Payer: PPO | Attending: Oncology | Admitting: Oncology

## 2021-02-16 ENCOUNTER — Other Ambulatory Visit (INDEPENDENT_AMBULATORY_CARE_PROVIDER_SITE_OTHER): Payer: PPO

## 2021-02-16 ENCOUNTER — Other Ambulatory Visit: Payer: Self-pay

## 2021-02-16 VITALS — BP 138/77 | HR 87 | Temp 97.8°F | Resp 18 | Ht 73.0 in | Wt 286.6 lb

## 2021-02-16 DIAGNOSIS — R609 Edema, unspecified: Secondary | ICD-10-CM | POA: Insufficient documentation

## 2021-02-16 DIAGNOSIS — E039 Hypothyroidism, unspecified: Secondary | ICD-10-CM | POA: Diagnosis not present

## 2021-02-16 DIAGNOSIS — E890 Postprocedural hypothyroidism: Secondary | ICD-10-CM

## 2021-02-16 DIAGNOSIS — J45909 Unspecified asthma, uncomplicated: Secondary | ICD-10-CM | POA: Diagnosis not present

## 2021-02-16 DIAGNOSIS — N2889 Other specified disorders of kidney and ureter: Secondary | ICD-10-CM | POA: Insufficient documentation

## 2021-02-16 DIAGNOSIS — B20 Human immunodeficiency virus [HIV] disease: Secondary | ICD-10-CM | POA: Insufficient documentation

## 2021-02-16 DIAGNOSIS — I10 Essential (primary) hypertension: Secondary | ICD-10-CM | POA: Insufficient documentation

## 2021-02-16 DIAGNOSIS — C469 Kaposi's sarcoma, unspecified: Secondary | ICD-10-CM | POA: Diagnosis not present

## 2021-02-16 LAB — TSH: TSH: 0.25 u[IU]/mL — ABNORMAL LOW (ref 0.35–5.50)

## 2021-02-16 LAB — T4, FREE: Free T4: 1.33 ng/dL (ref 0.60–1.60)

## 2021-02-16 NOTE — Progress Notes (Signed)
[facility] OFFICE PROGRESS NOTE   Diagnosis: Kaposi's sarcoma  INTERVAL HISTORY:   [name] was last seen at the Cancer center in December 2020.  He reports feeling well.  No change in the hyperpigmented nodular skin lesions.  No pain other than discomfort at the right knee.  He recently had an arthroscopic surgery on the right knee. He reports intentional weight loss. Objective:  Vital signs in last 24 hours:  Blood pressure 138/77, pulse 87, temperature 97.8 F (36.6 C), temperature source Oral, resp. rate 18, height 6\' 1"  (1.854 m), weight 286 lb 9.6 oz (130 kg), SpO2 97 %.    Lymphatics: No cervical, supraclavicular, axillary, or inguinal nodes Resp: Lungs clear bilaterally Cardio: Regular rate and rhythm GI: No hepatosplenomegaly Vascular: Chronic stasis change at the lower leg bilaterally  Skin: 3-4 mm hyperpigmented round nodular lesions over the legs   Lab Results:  Lab Results  Component Value Date   WBC 5.3 10/29/2020   HGB 15.0 10/29/2020   HCT 47.3 10/29/2020   MCV 83.2 10/29/2020   PLT 213.0 10/29/2020   NEUTROABS 2.7 06/14/2018    CMP  Lab Results  Component Value Date   NA 139 10/29/2020   K 3.0 (L) 11/17/2020   CL 94 (L) 10/29/2020   CO2 35 (H) 10/29/2020   GLUCOSE 160 (H) 10/29/2020   BUN 16 10/29/2020   CREATININE 1.22 10/29/2020   CALCIUM 9.4 10/29/2020   PROT 7.3 09/25/2020   ALBUMIN 4.4 07/13/2017   AST 11 09/25/2020   ALT 11 09/25/2020   ALKPHOS 58 07/13/2017   BILITOT 1.0 09/25/2020   GFRNONAA >60 07/13/2017   GFRAA >60 07/13/2017      Medications: I have reviewed the patient's current medications.   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   Status post Doxil (5 cycles 04/29/2009 through 09/22/2009 and then 3 cycles of paclitaxel  12/01/2009 through 02/09/2010.   Treatment subsequently placed on hold due to bilateral subdural hematomas.   CT scans 10/28/2013 showed a new prevascular mass with PET scan showing mild low level FDG uptake in the anterior mediastinal mass with more pronounced FDG accumulation within the dominant mesenteric lymph node.   Restaging PET scan 03/13/2014 showed a decrease in low level FDG uptake associated with the prevascular mass; similar degree of FDG uptake associated with small mesenteric lymph nodes; persistent increased FDG uptake associated with the diffuse lytic bone metastasis. CT scan also 03/13/2014 showed interval decrease in the size of the anterior mediastinal mass, unchanged multiple small bowel mesenteric lymph nodes and re-demonstrated widespread lytic osseous metastasis. HIV/AIDS followed by Dr. Johnnye Sima. He continues Abacavir-Dolutegravir-Lamivud.   History of bilateral subdural hematomas status post evacuation 03/05/2010. Hypertension. Renal dysfunction. Asthma. History of hyperthyroidism status post radioactive iodine November 2012 with subsequent hypothyroidism now on Synthroid. Port-A-Cath placement 10/14/2009. Lower extremity edema, bilateral, question lymphedema. He has been evaluated at the lymphedema clinic. Skin lesions. Status post evaluation by dermatology.      Disposition: Steven [name] remains in clinical remission from [place].  He would like to continue follow-up at the Cancer center.  He will return for an office visit in 1 year.  He continues follow-up with Dr. Johnnye Sima.  He will call for a change in the chronic skin lesions or new symptoms.  Betsy Coder, MD  02/16/2021  1:38 PM

## 2021-02-17 ENCOUNTER — Ambulatory Visit: Payer: Self-pay

## 2021-02-19 ENCOUNTER — Ambulatory Visit (INDEPENDENT_AMBULATORY_CARE_PROVIDER_SITE_OTHER): Payer: PPO | Admitting: Endocrinology

## 2021-02-19 ENCOUNTER — Other Ambulatory Visit: Payer: Self-pay

## 2021-02-19 ENCOUNTER — Other Ambulatory Visit: Payer: Self-pay | Admitting: Family Medicine

## 2021-02-19 ENCOUNTER — Encounter: Payer: Self-pay | Admitting: Endocrinology

## 2021-02-19 VITALS — BP 142/100 | HR 73 | Ht 73.0 in | Wt 286.4 lb

## 2021-02-19 DIAGNOSIS — E890 Postprocedural hypothyroidism: Secondary | ICD-10-CM

## 2021-02-19 NOTE — Progress Notes (Signed)
Patient ID: Steven [name], male   DOB: [birth year], 40 y.o.   MRN: [id number]     Reason for Appointment:  Hypothyroidism, follow-up visit    History of Present Illness:   HYPOTHYROIDISM  was first diagnosed around 2013  He previously had hyperthyroidism which was treated with radioactive iodine in 05/2011  He has been given between 150 and 200 mcg of levothyroxine in the last few years based on his labs Taking generic levothyroxine  He does not feel tired or sluggish and no significant weight change  He has been regular with his levothyroxine consistently daily on empty stomach, usually 1 hour before eating Not taking any multivitamins with the medication  TSH is now slightly low compared to 4.0  He appears to have lost about 15 pounds since his last visit  Wt Readings from Last 3 Encounters:  02/19/21 286 lb 6.4 oz (129.9 kg)  02/16/21 286 lb 9.6 oz (130 kg)  02/03/21 285 lb 9.6 oz (129.5 kg)    Labs:  Lab Results  Component Value Date   TSH 0.25 (L) 02/16/2021   TSH 4.03 11/09/2020   TSH 1.04 10/29/2020   FREET4 1.33 02/16/2021   FREET4 1.06 11/09/2020   FREET4 1.32 11/28/2019    OTHER active problems including diabetes are discussed in review of systems:    Past Medical History:  Diagnosis Date   Asthma    Cancer ([place])    Colitis 05/27/2011   Diabetes mellitus without complication (HCC)    HIV positive ([place]) 03/23/09   Genotype Y181C   HTN (hypertension)    Kaposi's sarcoma    Syphilis 03/23/09-   1:2    Past Surgical History:  Procedure Laterality Date   BRAIN SURGERY     IR GENERIC HISTORICAL  02/12/2016   IR REMOVAL TUN ACCESS W/ PORT W/O FL MOD SED 02/12/2016 [name], MD [facility]    Family History  Problem Relation Age of Onset   Pancreatic cancer Mother    Cancer Mother    Diabetes Paternal Grandmother    Thyroid disease Paternal Grandmother     Social History:  reports that he has never smoked. He has never used  smokeless tobacco. He reports current alcohol use. He reports that he does not use drugs.  Allergies:  Allergies  Allergen Reactions   Lisinopril Swelling    Swelling of lower lip while on lisinopril   Penicillins Hives and Swelling    REACTION: rash and swelling   Levaquin [Levofloxacin In D5w] Rash   Tessalon [Benzonatate] Rash   Aspirin Nausea Only    Allergies as of 02/19/2021       Reactions   Lisinopril Swelling   Swelling of lower lip while on lisinopril   Penicillins Hives, Swelling   REACTION: rash and swelling   Levaquin [levofloxacin In D5w] Rash   Tessalon [benzonatate] Rash   Aspirin Nausea Only        Medication List        Accurate as of February 19, 2021 11:37 AM. If you have any questions, ask your nurse or doctor.          Advair HFA 230-21 MCG/ACT inhaler Generic drug: fluticasone-salmeterol INHALE 2 PUFFS BY MOUTH TWICE DAILY   albuterol 108 (90 Base) MCG/ACT inhaler Commonly known as: VENTOLIN HFA INHALE 2 PUFFS BY MOUTH EVERY 4 TO 6 HOURS AS NEEDED FOR COUGH OR WHEEZING   amLODipine 10 MG tablet Commonly known as: NORVASC TAKE 1 TABLET(10 MG)  BY MOUTH DAILY FOR BLOOD PRESSURE   EPINEPHrine 0.3 mg/0.3 mL Soaj injection Commonly known as: EPI-PEN Inject 0.3 mg into the muscle as needed for anaphylaxis.   esomeprazole 40 MG capsule Commonly known as: NEXIUM TAKE ONE CAPSULE BY MOUTH ONCE DAILY AS DIRECTED What changed: See the new instructions.   fexofenadine 180 MG tablet Commonly known as: ALLEGRA Take 1 tablet (180 mg total) by mouth daily.   fluticasone 110 MCG/ACT inhaler Commonly known as: Flovent HFA Inhale 2 puffs into the lungs 2 (two) times daily.   fluticasone 50 MCG/ACT nasal spray Commonly known as: FLONASE SHAKE LIQUID AND USE 2 SPRAYS IN EACH NOSTRIL DAILY   glipiZIDE 5 MG 24 hr tablet Commonly known as: GLUCOTROL XL TAKE 1 TABLET(5 MG) BY MOUTH DAILY WITH BREAKFAST FOR DIABETES   hydrochlorothiazide 25 MG  tablet Commonly known as: HYDRODIURIL TAKE 1 TABLET BY MOUTH EVERY DAY FOR BLOOD PRESSURE   ibuprofen 800 MG tablet Commonly known as: ADVIL Take 800 mg by mouth 3 (three) times daily as needed.   Lancets Micro Thin 33G Misc USE TO TEST BLOOD SUGAR ONCE A DAY   levothyroxine 175 MCG tablet Commonly known as: SYNTHROID Take 1 tablet (175 mcg total) by mouth daily.   metFORMIN 500 MG 24 hr tablet Commonly known as: GLUCOPHAGE-XR TAKE 2 TABLETS BY MOUTH IN THE MORNING AND IN THE EVENING FOR DIABETES   montelukast 10 MG tablet Commonly known as: SINGULAIR TAKE 1 TABLET(10 MG) BY MOUTH DAILY   multivitamin tablet Take 1 tablet by mouth daily.   ONE TOUCH ULTRA TEST test strip Generic drug: glucose blood TEST BLOOD GLUCOSE THREE TIMES DAILY AS DIRECTED   glucose blood test strip Use as instructed to test up to 4 times a day DX E11.9   Pazeo 0.7 % Soln Generic drug: Olopatadine HCl Place 1 drop into both eyes daily.   Olopatadine HCl 0.2 % Soln Place 1 drop into both eyes daily as needed (itchy/watery eyes).   potassium chloride SA 20 MEQ tablet Commonly known as: KLOR-CON Take 1 tablet (20 mEq total) by mouth 2 (two) times daily.   rosuvastatin 5 MG tablet Commonly known as: Crestor Take 1 tablet (5 mg total) by mouth daily. For cholesterol.   Triumeq 600-50-300 MG tablet Generic drug: abacavir-dolutegravir-lamiVUDine TAKE 1 TABLET BY MOUTH DAILY   Xolair 150 MG injection Generic drug: omalizumab INJECT 300 MG UNDER THE SKIN EVERY TWO WEEKS        Review of Systems:    DIABETES: He has followed with his PCP, his A1c recently checked: Sugar 70-130   Lab Results  Component Value Date   HGBA1C 6.7 (H) 10/29/2020   HGBA1C 6.1 (A) 05/28/2020   HGBA1C 9.0 (A) 12/26/2019   Lab Results  Component Value Date   MICROALBUR 3.1 (H) 12/26/2019   LDLCALC 45 09/14/2020   CREATININE 1.22 10/29/2020     HYPERTENSION:  history of high blood pressure treated by  PCP     BP Readings from Last 3 Encounters:  02/19/21 (!) 142/100  02/16/21 138/77  02/03/21 122/76        Examination:    BP (!) 142/100   Pulse 73   Ht 6\' 1"  (1.854 m)   Wt 286 lb 6.4 oz (129.9 kg)   SpO2 97%   BMI 37.79 kg/m   Exam not indicated  Assessment/plan and recommendations:    Hypothyroidism, post radioactive iodine He previously had been on a stable prescription of 150 mcg  levothyroxine since 2018  Currently on 175 mcg daily Subjectively doing well  With his recent weight loss his TSH is now 0.25 He has been consistent with taking his medication before breakfast as before  He will now take 6-1/2 tablets a week but continue to 175 mcg prescription  Advised him to follow-up with his PCP regarding blood pressure management, blood pressure is higher today but not consistent  Patient Instructions  Take 1 pill daily with 1/2 on Sundays   Elayne Snare 02/19/2021, 11:37 AM    Note: This office note was prepared with Dragon voice recognition system technology. Any transcriptional errors that result from this process are unintentional.

## 2021-02-19 NOTE — Patient Instructions (Signed)
Take 1 pill daily with 1/2 on Sundays

## 2021-02-22 ENCOUNTER — Other Ambulatory Visit: Payer: Self-pay

## 2021-02-22 ENCOUNTER — Encounter: Payer: Self-pay | Admitting: Podiatry

## 2021-02-22 ENCOUNTER — Ambulatory Visit (INDEPENDENT_AMBULATORY_CARE_PROVIDER_SITE_OTHER): Payer: PPO | Admitting: Podiatry

## 2021-02-22 DIAGNOSIS — N182 Chronic kidney disease, stage 2 (mild): Secondary | ICD-10-CM

## 2021-02-22 DIAGNOSIS — B351 Tinea unguium: Secondary | ICD-10-CM

## 2021-02-22 DIAGNOSIS — E1122 Type 2 diabetes mellitus with diabetic chronic kidney disease: Secondary | ICD-10-CM

## 2021-02-22 DIAGNOSIS — M79676 Pain in unspecified toe(s): Secondary | ICD-10-CM | POA: Diagnosis not present

## 2021-02-22 DIAGNOSIS — L84 Corns and callosities: Secondary | ICD-10-CM | POA: Diagnosis not present

## 2021-02-22 MED ORDER — CICLOPIROX 8 % EX SOLN
CUTANEOUS | 11 refills | Status: DC
Start: 2021-02-22 — End: 2021-06-22

## 2021-02-24 ENCOUNTER — Ambulatory Visit (INDEPENDENT_AMBULATORY_CARE_PROVIDER_SITE_OTHER): Payer: PPO

## 2021-02-24 ENCOUNTER — Other Ambulatory Visit: Payer: Self-pay

## 2021-02-24 ENCOUNTER — Other Ambulatory Visit: Payer: Self-pay | Admitting: Primary Care

## 2021-02-24 DIAGNOSIS — J4540 Moderate persistent asthma, uncomplicated: Secondary | ICD-10-CM | POA: Diagnosis not present

## 2021-02-24 DIAGNOSIS — E785 Hyperlipidemia, unspecified: Secondary | ICD-10-CM

## 2021-02-25 NOTE — Telephone Encounter (Signed)
Patient has follow up in November I have sent in refill

## 2021-02-25 NOTE — Progress Notes (Signed)
Subjective: Steven [name] is a pleasant 40 y.o. male patient seen today painful thick toenails that are difficult to trim. Pain interferes with ambulation. Aggravating factors include wearing enclosed shoe gear. Pain is relieved with periodic professional debridement.   He would like to discuss topical treatment for his fungal toenails on today's visit.  He has h/o diabetes and stage 2 CKD.He states his blood glucose was 100 mg/dl this morning.  PCP is Pleas [name], NP. Last visit was: 10/29/2020.  He is recuperating from knee surgery. He voices no new pedal problems on today's visit.  Allergies  Allergen Reactions   Lisinopril Swelling    Swelling of lower lip while on lisinopril   Penicillins Hives and Swelling    REACTION: rash and swelling   Levaquin [Levofloxacin In D5w] Rash   Tessalon [Benzonatate] Rash   Aspirin Nausea Only    Objective: Physical Exam  General: Steven [name] is a pleasant 40 y.o. African American male, morbidly obese in NAD. AAO x 3.   Vascular:  Capillary refill time to digits immediate b/l. Palpable DP pulse(s) b/l lower extremities Palpable PT pulse(s) b/l lower extremities Pedal hair absent. Lower extremity skin temperature gradient within normal limits. No pain with calf compression b/l. Lymphedema present b/l lower extremities.  Dermatological:  Skin warm and supple b/l lower extremities. No open wounds b/l lower extremities. No interdigital macerations b/l lower extremities. Toenails 1-5 b/l elongated, discolored, dystrophic, thickened, crumbly with subungual debris and tenderness to dorsal palpation. Hyperkeratotic lesion(s) L hallux and R hallux.  No erythema, no edema, no drainage, no fluctuance.  Musculoskeletal:  Normal muscle strength 5/5 to all lower extremity muscle groups bilaterally. No pain crepitus or joint limitation noted with ROM b/l lower extremities. No gross bony deformities b/l lower extremities.  Neurological:   Protective sensation intact 5/5 intact bilaterally with 10g monofilament b/l. Vibratory sensation intact b/l.  Assessment and Plan:  1. Pain due to onychomycosis of toenail   2. Callus   3. Type 2 diabetes mellitus with stage 2 chronic kidney disease, without long-term current use of insulin ([place])      -Examined patient. -Continue diabetic foot care principles: inspect feet daily, monitor glucose as recommended by PCP and/or Endocrinologist, and follow prescribed diet per PCP, Endocrinologist and/or dietician. -Patient to continue soft, supportive shoe gear daily. -Discussed topical, laser and oral medication. Patient opted for topical treatment. Rx sent to pharmacy for 8% Ciclopirox Solution.  Apply one coat to each toenail once daily for 48 weeks. Remove once weekly with nail polish remover. -Toenails 1-5 b/l were debrided in length and girth with sterile nail nippers and dremel without iatrogenic bleeding.  -Patient to report any pedal injuries to medical professional immediately. -Patient/POA to call should there be question/concern in the interim.  Return in about 3 months (around 05/25/2021).  Marzetta Board, DPM

## 2021-03-08 ENCOUNTER — Other Ambulatory Visit: Payer: Self-pay | Admitting: Family Medicine

## 2021-03-09 ENCOUNTER — Other Ambulatory Visit: Payer: Self-pay | Admitting: Family Medicine

## 2021-03-10 ENCOUNTER — Ambulatory Visit (INDEPENDENT_AMBULATORY_CARE_PROVIDER_SITE_OTHER): Payer: PPO

## 2021-03-10 ENCOUNTER — Other Ambulatory Visit: Payer: Self-pay

## 2021-03-10 DIAGNOSIS — J4540 Moderate persistent asthma, uncomplicated: Secondary | ICD-10-CM

## 2021-03-11 ENCOUNTER — Ambulatory Visit: Payer: PPO

## 2021-03-16 ENCOUNTER — Encounter: Payer: Self-pay | Admitting: Infectious Diseases

## 2021-03-24 ENCOUNTER — Ambulatory Visit (INDEPENDENT_AMBULATORY_CARE_PROVIDER_SITE_OTHER): Payer: PPO

## 2021-03-24 ENCOUNTER — Other Ambulatory Visit: Payer: Self-pay

## 2021-03-24 DIAGNOSIS — J4550 Severe persistent asthma, uncomplicated: Secondary | ICD-10-CM | POA: Diagnosis not present

## 2021-04-03 ENCOUNTER — Other Ambulatory Visit: Payer: Self-pay | Admitting: Primary Care

## 2021-04-03 ENCOUNTER — Other Ambulatory Visit: Payer: Self-pay | Admitting: Infectious Diseases

## 2021-04-03 DIAGNOSIS — E119 Type 2 diabetes mellitus without complications: Secondary | ICD-10-CM

## 2021-04-03 DIAGNOSIS — B20 Human immunodeficiency virus [HIV] disease: Secondary | ICD-10-CM

## 2021-04-06 ENCOUNTER — Other Ambulatory Visit: Payer: Self-pay

## 2021-04-06 ENCOUNTER — Other Ambulatory Visit: Payer: Self-pay | Admitting: Primary Care

## 2021-04-06 ENCOUNTER — Ambulatory Visit (INDEPENDENT_AMBULATORY_CARE_PROVIDER_SITE_OTHER): Payer: Medicaid Other

## 2021-04-06 DIAGNOSIS — Z23 Encounter for immunization: Secondary | ICD-10-CM

## 2021-04-06 DIAGNOSIS — I10 Essential (primary) hypertension: Secondary | ICD-10-CM

## 2021-04-09 ENCOUNTER — Ambulatory Visit: Payer: PPO

## 2021-04-23 ENCOUNTER — Encounter: Payer: Self-pay | Admitting: Oncology

## 2021-04-23 ENCOUNTER — Other Ambulatory Visit: Payer: Self-pay

## 2021-04-26 ENCOUNTER — Encounter: Payer: Self-pay | Admitting: Oncology

## 2021-04-27 ENCOUNTER — Other Ambulatory Visit (HOSPITAL_COMMUNITY): Payer: Self-pay

## 2021-04-27 ENCOUNTER — Telehealth: Payer: Self-pay | Admitting: *Deleted

## 2021-04-27 ENCOUNTER — Encounter: Payer: Self-pay | Admitting: Oncology

## 2021-04-27 NOTE — Telephone Encounter (Signed)
Patient called and advised he no longer has [employer] and only has M[employer] and wants to get back on his Xolair injs. I tried to do approval and MCD states still has part D coverage. I called patient and advised him to contact his case worker to fix his MCD and let me know and I will try again to get approval

## 2021-05-05 ENCOUNTER — Other Ambulatory Visit: Payer: Self-pay | Admitting: Primary Care

## 2021-05-05 DIAGNOSIS — E119 Type 2 diabetes mellitus without complications: Secondary | ICD-10-CM

## 2021-05-17 ENCOUNTER — Encounter: Payer: Self-pay | Admitting: Infectious Diseases

## 2021-05-26 ENCOUNTER — Telehealth: Payer: Self-pay | Admitting: *Deleted

## 2021-05-26 NOTE — Telephone Encounter (Signed)
Patient had change of Ins so had to get approval and submit new info to [facility]. L/M for patient advising delivery 11/22 to contact clinic for appt to restart Xolair

## 2021-05-28 ENCOUNTER — Encounter: Payer: PPO | Admitting: Primary Care

## 2021-05-31 ENCOUNTER — Ambulatory Visit (INDEPENDENT_AMBULATORY_CARE_PROVIDER_SITE_OTHER): Payer: Self-pay | Admitting: Podiatry

## 2021-05-31 DIAGNOSIS — Z91199 Patient's noncompliance with other medical treatment and regimen due to unspecified reason: Secondary | ICD-10-CM

## 2021-05-31 NOTE — Progress Notes (Signed)
No show for appt. 

## 2021-06-21 ENCOUNTER — Other Ambulatory Visit: Payer: Self-pay | Admitting: Primary Care

## 2021-06-21 ENCOUNTER — Other Ambulatory Visit: Payer: Self-pay | Admitting: Family Medicine

## 2021-06-21 ENCOUNTER — Other Ambulatory Visit: Payer: Self-pay | Admitting: Podiatry

## 2021-06-21 DIAGNOSIS — I10 Essential (primary) hypertension: Secondary | ICD-10-CM

## 2021-06-21 DIAGNOSIS — M79676 Pain in unspecified toe(s): Secondary | ICD-10-CM

## 2021-06-21 DIAGNOSIS — B351 Tinea unguium: Secondary | ICD-10-CM

## 2021-06-22 NOTE — Telephone Encounter (Signed)
Approved.  

## 2021-06-22 NOTE — Telephone Encounter (Signed)
Please advise 

## 2021-07-03 ENCOUNTER — Other Ambulatory Visit: Payer: Self-pay | Admitting: Primary Care

## 2021-07-03 DIAGNOSIS — E785 Hyperlipidemia, unspecified: Secondary | ICD-10-CM

## 2021-07-06 ENCOUNTER — Other Ambulatory Visit: Payer: Self-pay | Admitting: Primary Care

## 2021-07-06 DIAGNOSIS — E119 Type 2 diabetes mellitus without complications: Secondary | ICD-10-CM

## 2021-07-08 ENCOUNTER — Other Ambulatory Visit: Payer: Self-pay | Admitting: Family Medicine

## 2021-07-21 ENCOUNTER — Encounter: Payer: Self-pay | Admitting: Oncology

## 2021-07-22 ENCOUNTER — Ambulatory Visit: Payer: Medicaid Other | Admitting: Infectious Diseases

## 2021-07-23 ENCOUNTER — Ambulatory Visit (INDEPENDENT_AMBULATORY_CARE_PROVIDER_SITE_OTHER): Payer: PPO

## 2021-07-23 ENCOUNTER — Other Ambulatory Visit: Payer: Self-pay

## 2021-07-23 DIAGNOSIS — J4550 Severe persistent asthma, uncomplicated: Secondary | ICD-10-CM | POA: Diagnosis not present

## 2021-07-30 ENCOUNTER — Other Ambulatory Visit: Payer: Self-pay | Admitting: Primary Care

## 2021-07-30 DIAGNOSIS — E119 Type 2 diabetes mellitus without complications: Secondary | ICD-10-CM

## 2021-07-30 DIAGNOSIS — E785 Hyperlipidemia, unspecified: Secondary | ICD-10-CM

## 2021-08-03 ENCOUNTER — Other Ambulatory Visit: Payer: Self-pay | Admitting: Primary Care

## 2021-08-03 DIAGNOSIS — E119 Type 2 diabetes mellitus without complications: Secondary | ICD-10-CM

## 2021-08-06 ENCOUNTER — Ambulatory Visit: Payer: PPO

## 2021-08-06 ENCOUNTER — Other Ambulatory Visit: Payer: Self-pay

## 2021-08-06 ENCOUNTER — Encounter: Payer: Self-pay | Admitting: Primary Care

## 2021-08-06 ENCOUNTER — Ambulatory Visit (INDEPENDENT_AMBULATORY_CARE_PROVIDER_SITE_OTHER): Payer: PPO | Admitting: Primary Care

## 2021-08-06 VITALS — BP 128/82 | HR 93 | Temp 97.6°F | Resp 18 | Ht 73.0 in | Wt 281.5 lb

## 2021-08-06 DIAGNOSIS — C469 Kaposi's sarcoma, unspecified: Secondary | ICD-10-CM | POA: Diagnosis not present

## 2021-08-06 DIAGNOSIS — N1831 Chronic kidney disease, stage 3a: Secondary | ICD-10-CM

## 2021-08-06 DIAGNOSIS — E890 Postprocedural hypothyroidism: Secondary | ICD-10-CM

## 2021-08-06 DIAGNOSIS — J4550 Severe persistent asthma, uncomplicated: Secondary | ICD-10-CM | POA: Diagnosis not present

## 2021-08-06 DIAGNOSIS — K219 Gastro-esophageal reflux disease without esophagitis: Secondary | ICD-10-CM | POA: Diagnosis not present

## 2021-08-06 DIAGNOSIS — E1165 Type 2 diabetes mellitus with hyperglycemia: Secondary | ICD-10-CM

## 2021-08-06 DIAGNOSIS — I10 Essential (primary) hypertension: Secondary | ICD-10-CM | POA: Diagnosis not present

## 2021-08-06 DIAGNOSIS — B20 Human immunodeficiency virus [HIV] disease: Secondary | ICD-10-CM

## 2021-08-06 DIAGNOSIS — Z0000 Encounter for general adult medical examination without abnormal findings: Secondary | ICD-10-CM | POA: Diagnosis not present

## 2021-08-06 DIAGNOSIS — E785 Hyperlipidemia, unspecified: Secondary | ICD-10-CM

## 2021-08-06 DIAGNOSIS — E876 Hypokalemia: Secondary | ICD-10-CM | POA: Diagnosis not present

## 2021-08-06 LAB — COMPREHENSIVE METABOLIC PANEL
ALT: 17 U/L (ref 0–53)
AST: 16 U/L (ref 0–37)
Albumin: 4.7 g/dL (ref 3.5–5.2)
Alkaline Phosphatase: 53 U/L (ref 39–117)
BUN: 15 mg/dL (ref 6–23)
CO2: 33 mEq/L — ABNORMAL HIGH (ref 19–32)
Calcium: 9.4 mg/dL (ref 8.4–10.5)
Chloride: 95 mEq/L — ABNORMAL LOW (ref 96–112)
Creatinine, Ser: 1.38 mg/dL (ref 0.40–1.50)
GFR: 63.19 mL/min (ref >60.00)
Glucose, Bld: 164 mg/dL — ABNORMAL HIGH (ref 70–99)
Potassium: 2.9 mEq/L — ABNORMAL LOW (ref 3.5–5.1)
Sodium: 139 mEq/L (ref 135–145)
Total Bilirubin: 1.7 mg/dL — ABNORMAL HIGH (ref 0.2–1.2)
Total Protein: 7.8 g/dL (ref 6.0–8.3)

## 2021-08-06 LAB — MICROALBUMIN / CREATININE URINE RATIO
Creatinine,U: 125.6 mg/dL
Microalb Creat Ratio: 14.8 mg/g (ref 0.0–30.0)
Microalb, Ur: 18.6 mg/dL — ABNORMAL HIGH (ref 0.0–1.9)

## 2021-08-06 LAB — LIPID PANEL
Cholesterol: 137 mg/dL (ref 0–200)
HDL: 51.5 mg/dL (ref >39.00)
LDL Cholesterol: 57 mg/dL (ref 0–99)
NonHDL: 85.86
Total CHOL/HDL Ratio: 3
Triglycerides: 142 mg/dL (ref 0.0–149.0)
VLDL: 28.4 mg/dL (ref 0.0–40.0)

## 2021-08-06 LAB — T4, FREE: Free T4: 1.34 ng/dL (ref 0.60–1.60)

## 2021-08-06 LAB — CBC
HCT: 48.5 % (ref 39.0–52.0)
Hemoglobin: 15.2 g/dL (ref 13.0–17.0)
MCHC: 31.3 g/dL (ref 30.0–36.0)
MCV: 83.1 fl (ref 78.0–100.0)
Platelets: 197 10*3/uL (ref 150.0–400.0)
RBC: 5.84 Mil/uL — ABNORMAL HIGH (ref 4.22–5.81)
RDW: 15.1 % (ref 11.5–15.5)
WBC: 4.1 10*3/uL (ref 4.0–10.5)

## 2021-08-06 LAB — TSH: TSH: 1.13 u[IU]/mL (ref 0.35–5.50)

## 2021-08-06 NOTE — Progress Notes (Signed)
Subjective:    Patient ID: Steven [name], male    DOB: 1981/05/27, 41 y.o.   MRN: [id number]  HPI  Steven [name] is a very pleasant 41 y.o. male who presents today for complete physical and follow up of chronic conditions.  He does not check his BP at home. He has been under a lot of stress recently with having to find a new job and new home.   He is checking glucose readings which range 70's-130's Increased over the holidays, about 150's.   Immunizations: -Tetanus: 2015 -Influenza: Completed this season  -Covid-19: 4 vaccines -Pneumonia: 2022, last vaccine   Diet: Fair diet.  Exercise: No regular exercise.  Eye exam: Completes annually  Dental exam: Completes semi-annually    BP Readings from Last 3 Encounters:  08/06/21 128/82  02/19/21 (!) 142/100  02/16/21 138/77       Review of Systems  Constitutional:  Negative for unexpected weight change.  HENT:  Negative for rhinorrhea.   Respiratory:  Negative for cough and shortness of breath.   Cardiovascular:  Negative for chest pain.  Gastrointestinal:  Negative for constipation and diarrhea.  Genitourinary:  Negative for difficulty urinating.  Musculoskeletal:  Negative for arthralgias and myalgias.  Skin:  Negative for rash.  Allergic/Immunologic: Positive for environmental allergies.  Neurological:  Negative for dizziness and headaches.  Psychiatric/Behavioral:  The patient is not nervous/anxious.         Past Medical History:  Diagnosis Date   Asthma    Cancer ([place])    Colitis 05/27/2011   Diabetes mellitus without complication (HCC)    HIV positive ([place]) 03/23/09   Genotype Y181C   HTN (hypertension)    Kaposi's sarcoma    Syphilis 03/23/09-   1:2    Social History   Socioeconomic History   Marital status: Single    Spouse name: Not on file   Number of children: Not on file   Years of education: Not on file   Highest education level: Not on file  Occupational History   Not on file   Tobacco Use   Smoking status: Never   Smokeless tobacco: Never  Vaping Use   Vaping Use: Never used  Substance and Sexual Activity   Alcohol use: Yes    Alcohol/week: 0.0 standard drinks    Comment: socially - less now   Drug use: No   Sexual activity: Not Currently    Partners: Male    Birth control/protection: Condom    Comment: pt. declined condoms  Other Topics Concern   Not on file  Social History Narrative   Single.    No children.   Works in Therapist, art   Enjoys DJ, traveling.    Social Determinants of Health   Financial Resource Strain: Not on file  Food Insecurity: Not on file  Transportation Needs: Not on file  Physical Activity: Not on file  Stress: Not on file  Social Connections: Not on file  Intimate Partner Violence: Not on file    Past Surgical History:  Procedure Laterality Date   BRAIN SURGERY     IR GENERIC HISTORICAL  02/12/2016   IR REMOVAL TUN ACCESS W/ PORT W/O FL MOD SED 02/12/2016 [name], MD [facility]    Family History  Problem Relation Age of Onset   Pancreatic cancer Mother    Cancer Mother    Diabetes Paternal Grandmother    Thyroid disease Paternal Grandmother     Allergies  Allergen Reactions   Lisinopril  Swelling    Swelling of lower lip while on lisinopril   Penicillins Hives and Swelling    REACTION: rash and swelling   Levaquin [Levofloxacin In D5w] Rash   Tessalon [Benzonatate] Rash   Aspirin Nausea Only    Current Outpatient Medications on File Prior to Visit  Medication Sig Dispense Refill   albuterol (VENTOLIN HFA) 108 (90 Base) MCG/ACT inhaler INHALE 2 PUFFS BY MOUTH EVERY 4 TO 6 HOURS AS NEEDED FOR COUGH OR WHEEZING 42.5 g 0   amLODipine (NORVASC) 10 MG tablet TAKE 1 TABLET(10 MG) BY MOUTH DAILY FOR BLOOD PRESSURE 90 tablet 3   Blood Glucose Monitoring Suppl (ONETOUCH VERIO) w/Device KIT USE AS DIRECTED TO TEST UP TO FOUR TIMES DAILY     ciclopirox (PENLAC) 8 % solution APPLY 1 COAT TO TOENAIL EVERY DAY  FOR 48 WEEKS. REMOVE WEEKLY WITH POLISH REMOVER 6.6 mL 11   EPINEPHRINE 0.3 mg/0.3 mL IJ SOAJ injection INJECT 0.3 MG IN THE MUSCLE AS NEEDED FOR ANAPHYLAXIS 2 each 1   esomeprazole (NEXIUM) 40 MG capsule TAKE ONE CAPSULE BY MOUTH ONCE DAILY AS DIRECTED (Patient taking differently: Take 40 mg by mouth daily.) 30 capsule 0   fexofenadine (ALLEGRA) 180 MG tablet Take 1 tablet (180 mg total) by mouth daily. 15 tablet 0   fluticasone (FLONASE) 50 MCG/ACT nasal spray SHAKE LIQUID AND USE 2 SPRAYS IN EACH NOSTRIL DAILY 16 g 5   fluticasone (FLOVENT HFA) 110 MCG/ACT inhaler Inhale 2 puffs into the lungs 2 (two) times daily. 12 g 5   fluticasone-salmeterol (ADVAIR HFA) 230-21 MCG/ACT inhaler INHALE 2 PUFFS BY MOUTH TWICE DAILY 12 g 0   glipiZIDE (GLUCOTROL XL) 5 MG 24 hr tablet Take 1 tablet (5 mg total) by mouth daily with breakfast. For diabetes. Office visit required for further refills. 30 tablet 0   glucose blood test strip Use as instructed to test up to 4 times a day DX E11.9 100 each 3   hydrochlorothiazide (HYDRODIURIL) 25 MG tablet TAKE 1 TABLET BY MOUTH EVERY DAY FOR BLOOD PRESSURE 90 tablet 3   ibuprofen (ADVIL) 800 MG tablet Take 800 mg by mouth 3 (three) times daily as needed.     LANCETS MICRO THIN 33G MISC USE TO TEST BLOOD SUGAR ONCE A DAY 100 each 2   levothyroxine (SYNTHROID) 175 MCG tablet Take 1 tablet (175 mcg total) by mouth daily. 90 tablet 3   metFORMIN (GLUCOPHAGE-XR) 500 MG 24 hr tablet TAKE 2 TABLETS BY MOUTH IN THE MORNING AND IN THE EVENING FOR DIABETES 360 tablet 3   montelukast (SINGULAIR) 10 MG tablet TAKE 1 TABLET(10 MG) BY MOUTH DAILY 90 tablet 2   Multiple Vitamin (MULTIVITAMIN) tablet Take 1 tablet by mouth daily.     Olopatadine HCl 0.2 % SOLN Place 1 drop into both eyes daily as needed (itchy/watery eyes). 2.5 mL 5   ONE TOUCH ULTRA TEST test strip TEST BLOOD GLUCOSE THREE TIMES DAILY AS DIRECTED 100 each 2   PAZEO 0.7 % SOLN Place 1 drop into both eyes daily. 1  Bottle 5   potassium chloride SA (KLOR-CON) 20 MEQ tablet Take 1 tablet (20 mEq total) by mouth 2 (two) times daily. 180 tablet 3   rosuvastatin (CRESTOR) 5 MG tablet TAKE 1 TABLET BY MOUTH EVERY DAY FOR CHOLESTEROL 90 tablet 0   TRIUMEQ 600-50-300 MG tablet TAKE 1 TABLET BY MOUTH DAILY 90 tablet 1   XOLAIR 150 MG injection INJECT 300 MG UNDER THE SKIN EVERY TWO  WEEKS 4 each 11   Current Facility-Administered Medications on File Prior to Visit  Medication Dose Route Frequency Provider Last Rate Last Admin   omalizumab Arvid Right) injection 300 mg  300 mg Subcutaneous Q14 Days Rexene Alberts M, DO   300 mg at 07/23/21 1011    BP 128/82    Pulse 93    Temp 97.6 F (36.4 C) (Temporal)    Resp 18    Ht _0  (1.854 m)    Wt 281 lb 8 oz (127.7 kg)    SpO2 96%    BMI 37.14 kg/m  Objective:   Physical Exam HENT:     Right Ear: Tympanic membrane and ear canal normal.     Left Ear: Tympanic membrane and ear canal normal.     Nose: Nose normal.     Right Sinus: No maxillary sinus tenderness or frontal sinus tenderness.     Left Sinus: No maxillary sinus tenderness or frontal sinus tenderness.  Eyes:     Conjunctiva/sclera: Conjunctivae normal.  Neck:     Thyroid: No thyromegaly.     Vascular: No carotid bruit.  Cardiovascular:     Rate and Rhythm: Normal rate and regular rhythm.     Heart sounds: Normal heart sounds.     Comments: Ankle edema bilaterally  Pulmonary:     Effort: Pulmonary effort is normal.     Breath sounds: Normal breath sounds. No wheezing or rales.  Abdominal:     General: Bowel sounds are normal.     Palpations: Abdomen is soft.     Tenderness: There is no abdominal tenderness.  Musculoskeletal:        General: Normal range of motion.     Cervical back: Neck supple.  Skin:    General: Skin is warm and dry.     Comments: Dry, flaky skin to bilateral feet. Elongated toenails  Neurological:     Mental Status: He is alert and oriented to person, place, and time.      Cranial Nerves: No cranial nerve deficit.     Deep Tendon Reflexes: Reflexes are normal and symmetric.  Psychiatric:        Mood and Affect: Mood normal.          Assessment & Plan:      This visit occurred during the SARS-CoV-2 public health emergency.  Safety protocols were in place, including screening questions prior to the visit, additional usage of staff PPE, and extensive cleaning of exam room while observing appropriate contact time as indicated for disinfecting solutions.

## 2021-08-06 NOTE — Assessment & Plan Note (Signed)
Overdue for repeat A1C today.  Continue Glipizide XL 10 mg daily and metformin XR 1000 mg BID.   Foot exam today. He will set up an eye exam. Managed on statin. Urine microalbumin due and pending.  Follow up in 3-6 months based upon A1C result.

## 2021-08-06 NOTE — Assessment & Plan Note (Signed)
He is taking levothyroxine correctly.  Continue levothyroxine 175 mcg. Repeat TSH and Free T4  pending.  Follows with endocrinology, office notes from August 2022 reviewed.

## 2021-08-06 NOTE — Patient Instructions (Signed)
Stop by the lab prior to leaving today. I will notify you of your results once received.   Schedule an appointment with your food doctor.  Schedule a follow up visit for 6 months for diabetes check.  It was a pleasure to see you today!  Preventive Care 73-43 Years Old, Male Preventive care refers to lifestyle choices and visits with your health care provider that can promote health and wellness. Preventive care visits are also called wellness exams. What can I expect for my preventive care visit? Counseling During your preventive care visit, your health care provider may ask about your: Medical history, including: Past medical problems. Family medical history. Current health, including: Emotional well-being. Home life and relationship well-being. Sexual activity. Lifestyle, including: Alcohol, nicotine or tobacco, and drug use. Access to firearms. Diet, exercise, and sleep habits. Safety issues such as seatbelt and bike helmet use. Sunscreen use. Work and work Statistician. Physical exam Your health care provider will check your: Height and weight. These may be used to calculate your BMI (body mass index). BMI is a measurement that tells if you are at a healthy weight. Waist circumference. This measures the distance around your waistline. This measurement also tells if you are at a healthy weight and may help predict your risk of certain diseases, such as type 2 diabetes and high blood pressure. Heart rate and blood pressure. Body temperature. Skin for abnormal spots. What immunizations do I need? Vaccines are usually given at various ages, according to a schedule. Your health care provider will recommend vaccines for you based on your age, medical history, and lifestyle or other factors, such as travel or where you work. What tests do I need? Screening Your health care provider may recommend screening tests for certain conditions. This may include: Lipid and cholesterol  levels. Diabetes screening. This is done by checking your blood sugar (glucose) after you have not eaten for a while (fasting). Hepatitis B test. Hepatitis C test. HIV (human immunodeficiency virus) test. STI (sexually transmitted infection) testing, if you are at risk. Lung cancer screening. Prostate cancer screening. Colorectal cancer screening. Talk with your health care provider about your test results, treatment options, and if necessary, the need for more tests. Follow these instructions at home: Eating and drinking  Eat a diet that includes fresh fruits and vegetables, whole grains, lean protein, and low-fat dairy products. Take vitamin and mineral supplements as recommended by your health care provider. Do not drink alcohol if your health care provider tells you not to drink. If you drink alcohol: Limit how much you have to 0-2 drinks a day. Know how much alcohol is in your drink. In the [place] one drink equals one 12 oz bottle of beer (355 mL), one 5 oz glass of wine (148 mL), or one 1 oz glass of hard liquor (44 mL). Lifestyle Brush your teeth every morning and night with fluoride toothpaste. Floss one time each day. Exercise for at least 30 minutes 5 or more days each week. Do not use any products that contain nicotine or tobacco. These products include cigarettes, chewing tobacco, and vaping devices, such as e-cigarettes. If you need help quitting, ask your health care provider. Do not use drugs. If you are sexually active, practice safe sex. Use a condom or other form of protection to prevent STIs. Take aspirin only as told by your health care provider. Make sure that you understand how much to take and what form to take. Work with your health care provider to  find out whether it is safe and beneficial for you to take aspirin daily. Find healthy ways to manage stress, such as: Meditation, yoga, or listening to music. Journaling. Talking to a trusted person. Spending time  with friends and family. Minimize exposure to UV radiation to reduce your risk of skin cancer. Safety Always wear your seat belt while driving or riding in a vehicle. Do not drive: If you have been drinking alcohol. Do not ride with someone who has been drinking. When you are tired or distracted. While texting. If you have been using any mind-altering substances or drugs. Wear a helmet and other protective equipment during sports activities. If you have firearms in your house, make sure you follow all gun safety procedures. What's next? Go to your health care provider once a year for an annual wellness visit. Ask your health care provider how often you should have your eyes and teeth checked. Stay up to date on all vaccines. This information is not intended to replace advice given to you by your health care provider. Make sure you discuss any questions you have with your health care provider. Document Revised: 12/23/2020 Document Reviewed: 12/23/2020 Elsevier Patient Education  [place].

## 2021-08-06 NOTE — Assessment & Plan Note (Signed)
Controlled. ?Continue Nexium 40 mg daily. ?

## 2021-08-06 NOTE — Assessment & Plan Note (Signed)
Immunizations UTD.  Discussed the importance of a healthy diet and regular exercise in order for weight loss, and to reduce the risk of further co-morbidity.  Exam today stable. Labs reviewed and pending.

## 2021-08-06 NOTE — Assessment & Plan Note (Signed)
Continue rosuvastatin 5 mg. Repeat lipids pending.

## 2021-08-06 NOTE — Assessment & Plan Note (Signed)
Controlled today with second BP check after rest!  Continue amlodipine 10 mg daily and HCTZ 25 mg daily. CMP pending.

## 2021-08-06 NOTE — Assessment & Plan Note (Signed)
Repeat BMP pending.  Avoid NSAID's. Consider low dose ARB for BP control.

## 2021-08-06 NOTE — Assessment & Plan Note (Signed)
Following with Dr. Johnnye Sima through ID. Office notes from April 2022 reviewed.

## 2021-08-06 NOTE — Assessment & Plan Note (Signed)
Controlled and undetected. Following with ID, Dr. Johnnye Sima. Office notes from April 2022 reviewed.  Continue Triumeq 600-50-300 daily.

## 2021-08-06 NOTE — Assessment & Plan Note (Signed)
Chronic.  He is actually taking potassium chloride 20 mEq once daily rather than BID.  Repeat potassium level pending.

## 2021-08-06 NOTE — Assessment & Plan Note (Signed)
No concerns today.   Infrequent use of albuterol. Compliant to Flovent 110 mcg.  Continue Flovent 110 mcg 2 puffs BID.  Continue PRN use of albuterol. Continue Xolair 300 mg every two weeks.  Follows with pulmonology and allergist.

## 2021-08-09 ENCOUNTER — Ambulatory Visit: Payer: PPO

## 2021-08-09 LAB — HEMOGLOBIN A1C: Hgb A1c MFr Bld: 5.5 % (ref 4.6–6.5)

## 2021-08-16 ENCOUNTER — Encounter: Payer: Self-pay | Admitting: Oncology

## 2021-08-16 ENCOUNTER — Other Ambulatory Visit (HOSPITAL_COMMUNITY): Payer: Self-pay

## 2021-08-17 ENCOUNTER — Other Ambulatory Visit: Payer: PPO

## 2021-08-19 ENCOUNTER — Ambulatory Visit (INDEPENDENT_AMBULATORY_CARE_PROVIDER_SITE_OTHER): Payer: Medicaid Other | Admitting: Infectious Diseases

## 2021-08-19 ENCOUNTER — Encounter: Payer: Self-pay | Admitting: Infectious Diseases

## 2021-08-19 ENCOUNTER — Ambulatory Visit (INDEPENDENT_AMBULATORY_CARE_PROVIDER_SITE_OTHER): Payer: PPO

## 2021-08-19 ENCOUNTER — Other Ambulatory Visit: Payer: Self-pay

## 2021-08-19 VITALS — BP 143/85 | HR 88 | Temp 98.6°F | Ht 74.0 in | Wt 287.0 lb

## 2021-08-19 DIAGNOSIS — Z79899 Other long term (current) drug therapy: Secondary | ICD-10-CM | POA: Diagnosis not present

## 2021-08-19 DIAGNOSIS — N1831 Chronic kidney disease, stage 3a: Secondary | ICD-10-CM | POA: Diagnosis not present

## 2021-08-19 DIAGNOSIS — E785 Hyperlipidemia, unspecified: Secondary | ICD-10-CM

## 2021-08-19 DIAGNOSIS — I10 Essential (primary) hypertension: Secondary | ICD-10-CM | POA: Diagnosis not present

## 2021-08-19 DIAGNOSIS — Z113 Encounter for screening for infections with a predominantly sexual mode of transmission: Secondary | ICD-10-CM

## 2021-08-19 DIAGNOSIS — B20 Human immunodeficiency virus [HIV] disease: Secondary | ICD-10-CM

## 2021-08-19 DIAGNOSIS — Z23 Encounter for immunization: Secondary | ICD-10-CM | POA: Diagnosis present

## 2021-08-19 DIAGNOSIS — E1165 Type 2 diabetes mellitus with hyperglycemia: Secondary | ICD-10-CM

## 2021-08-19 DIAGNOSIS — E890 Postprocedural hypothyroidism: Secondary | ICD-10-CM | POA: Diagnosis not present

## 2021-08-19 NOTE — Assessment & Plan Note (Signed)
On statin, LFTs normal Lab Results  Component Value Date   CHOL 137 08/06/2021   HDL 51.50 08/06/2021   LDLCALC 57 08/06/2021   TRIG 142.0 08/06/2021   CHOLHDL 3 08/06/2021

## 2021-08-19 NOTE — Assessment & Plan Note (Signed)
Appreciate his PCP f/u He is slightly elevated today.

## 2021-08-19 NOTE — Progress Notes (Signed)
° °  Covid-19 Vaccination Clinic  Name:  Steven [name]    MRN: [id number] DOB: 30-May-1981  08/19/2021  [name] was observed post Covid-19 immunization for 15 minutes without incident. He was provided with Vaccine Information Sheet and instruction to access the V-Safe system.   [name] was instructed to call 911 with any severe reactions post vaccine: Difficulty breathing  Swelling of face and throat  A fast heartbeat  A bad rash all over body  Dizziness and weakness   Immunizations Administered     Name Date Dose VIS Date Route   Pfizer Covid-19 Vaccine Bivalent Booster 08/19/2021  1:57 PM 0.3 mL 03/10/2021 Intramuscular   Manufacturer: [employer]   Lot: [id number]   [place]: McIntosh, RN

## 2021-08-19 NOTE — Addendum Note (Signed)
Addended by: Lucie Leather D on: 08/19/2021 02:29 PM   Modules accepted: Orders

## 2021-08-19 NOTE — Progress Notes (Signed)
° °  Subjective:    Patient ID: Amory Simonetti, male  DOB: [birth year], 41 y.o.        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 He is being monitored for his KS by Onc.   He was previously on CTX however in 2011 he had an intracranial bleed.    He was changed from TRV/ISN in 2014 to triumeq. He had f/u with Onc on 06-2019, was felt to be doing well.  He also has been seen by FP and endo for hypothyroidism and DM2.  CKD 1 A1C 5.5% (07-2021). Had R knee arthroscopy 11-19-20.  Has occas arthritis.   Has been stressful due to housing issues.  Wt down 13# from last year.  Given bivalent today, has had flu vax.    HIV 1 RNA Quant  Date Value  09/14/2020 <20 Copies/mL (H)  12/13/2019 <20 NOT DETECTED copies/mL  01/08/2019 <20 DETECTED copies/mL (A)   CD4 T Cell Abs (/uL)  Date Value  09/14/2020 283 (L)  12/13/2019 349 (L)  01/08/2019 386 (L)     Health Maintenance  Topic Date Due   COVID-19 Vaccine (5 - Booster for Pfizer series) 04/27/2021   OPHTHALMOLOGY EXAM  11/16/2021   HEMOGLOBIN A1C  02/03/2022   FOOT EXAM  08/06/2022   URINE MICROALBUMIN  08/06/2022   TETANUS/TDAP  12/27/2023   INFLUENZA VACCINE  Completed   Hepatitis C Screening  Completed   HIV Screening  Completed   HPV VACCINES  Aged Out      Review of Systems  Constitutional:  Negative for chills, fever and weight loss.  Respiratory:  Negative for cough and wheezing.   Cardiovascular:  Positive for leg swelling. Negative for chest pain.  Neurological:  Negative for dizziness and sensory change.  Psychiatric/Behavioral:  The patient does not have insomnia.    Please see HPI. All other systems reviewed and negative.     Objective:  Physical Exam Vitals reviewed.  Constitutional:      Appearance: Normal appearance. He is obese.  HENT:     Mouth/Throat:     Mouth: Mucous membranes are moist.     Pharynx: No oropharyngeal exudate.  Eyes:     Extraocular Movements:  Extraocular movements intact.     Pupils: Pupils are equal, round, and reactive to light.  Cardiovascular:     Rate and Rhythm: Normal rate and regular rhythm.  Pulmonary:     Effort: Pulmonary effort is normal.     Breath sounds: Normal breath sounds.  Abdominal:     General: Bowel sounds are normal. There is no distension.     Palpations: Abdomen is soft.     Tenderness: There is no abdominal tenderness.  Musculoskeletal:        General: Normal range of motion.     Cervical back: Normal range of motion and neck supple.  Neurological:     General: No focal deficit present.     Mental Status: He is alert.  Psychiatric:        Mood and Affect: Mood normal.          Assessment & Plan:

## 2021-08-19 NOTE — Assessment & Plan Note (Signed)
Appreciate his PCP f/u His last A1C was great

## 2021-08-19 NOTE — Assessment & Plan Note (Signed)
Last TSH normal.

## 2021-08-19 NOTE — Assessment & Plan Note (Signed)
His Cr is normal today May need to update this dx

## 2021-08-19 NOTE — Assessment & Plan Note (Signed)
He is doing well on triumeq Will ask about [place] Offered/refused condoms.  Will see him back in 9 months.  Check his labs today.

## 2021-08-23 ENCOUNTER — Other Ambulatory Visit: Payer: PPO

## 2021-08-23 LAB — COMPREHENSIVE METABOLIC PANEL
AG Ratio: 1.6 (calc) (ref 1.0–2.5)
ALT: 16 U/L (ref 9–46)
AST: 18 U/L (ref 10–40)
Albumin: 4.9 g/dL (ref 3.6–5.1)
Alkaline phosphatase (APISO): 48 U/L (ref 36–130)
BUN: 14 mg/dL (ref 7–25)
CO2: 34 mmol/L — ABNORMAL HIGH (ref 20–32)
Calcium: 10 mg/dL (ref 8.6–10.3)
Chloride: 97 mmol/L — ABNORMAL LOW (ref 98–110)
Creat: 1.25 mg/dL (ref 0.60–1.29)
Globulin: 3.1 g/dL (calc) (ref 1.9–3.7)
Glucose, Bld: 95 mg/dL (ref 65–99)
Potassium: 3.5 mmol/L (ref 3.5–5.3)
Sodium: 141 mmol/L (ref 135–146)
Total Bilirubin: 1.1 mg/dL (ref 0.2–1.2)
Total Protein: 8 g/dL (ref 6.1–8.1)

## 2021-08-23 LAB — T-HELPER CELLS (CD4) COUNT (NOT AT ARMC)
Absolute CD4: 385 cells/uL — ABNORMAL LOW (ref 490–1740)
CD4 T Helper %: 21 % — ABNORMAL LOW (ref 30–61)
Total lymphocyte count: 1871 cells/uL (ref 850–3900)

## 2021-08-23 LAB — C. TRACHOMATIS/N. GONORRHOEAE RNA
C. trachomatis RNA, TMA: NOT DETECTED
N. gonorrhoeae RNA, TMA: NOT DETECTED

## 2021-08-23 LAB — RPR TITER: RPR Titer: 1:1 {titer} — ABNORMAL HIGH

## 2021-08-23 LAB — HIV-1 RNA QUANT-NO REFLEX-BLD
HIV 1 RNA Quant: <20 Copies/mL — ABNORMAL HIGH
HIV-1 RNA Quant, Log: <1.3 Log cps/mL — ABNORMAL HIGH

## 2021-08-23 LAB — RPR: RPR Ser Ql: REACTIVE — AB

## 2021-08-23 LAB — FLUORESCENT TREPONEMAL AB(FTA)-IGG-BLD: Fluorescent Treponemal ABS: REACTIVE — AB

## 2021-08-25 DIAGNOSIS — M79676 Pain in unspecified toe(s): Secondary | ICD-10-CM

## 2021-08-26 ENCOUNTER — Ambulatory Visit: Payer: PPO | Admitting: Endocrinology

## 2021-08-30 ENCOUNTER — Encounter: Payer: Self-pay | Admitting: Infectious Diseases

## 2021-08-31 ENCOUNTER — Other Ambulatory Visit: Payer: Self-pay | Admitting: Primary Care

## 2021-08-31 DIAGNOSIS — E785 Hyperlipidemia, unspecified: Secondary | ICD-10-CM

## 2021-09-01 ENCOUNTER — Other Ambulatory Visit: Payer: Self-pay

## 2021-09-01 DIAGNOSIS — B20 Human immunodeficiency virus [HIV] disease: Secondary | ICD-10-CM

## 2021-09-01 MED ORDER — TRIUMEQ 600-50-300 MG PO TABS: 1.0000 | ORAL_TABLET | Freq: Every day | ORAL | 1 refills | Status: DC

## 2021-09-02 ENCOUNTER — Other Ambulatory Visit: Payer: Self-pay | Admitting: Endocrinology

## 2021-09-02 ENCOUNTER — Other Ambulatory Visit: Payer: Self-pay | Admitting: Primary Care

## 2021-09-02 DIAGNOSIS — I10 Essential (primary) hypertension: Secondary | ICD-10-CM

## 2021-09-07 ENCOUNTER — Other Ambulatory Visit: Payer: Self-pay | Admitting: Primary Care

## 2021-09-07 DIAGNOSIS — E119 Type 2 diabetes mellitus without complications: Secondary | ICD-10-CM

## 2021-09-14 ENCOUNTER — Other Ambulatory Visit: Payer: Self-pay | Admitting: Primary Care

## 2021-09-14 DIAGNOSIS — I10 Essential (primary) hypertension: Secondary | ICD-10-CM

## 2021-09-15 ENCOUNTER — Telehealth: Payer: Self-pay | Admitting: Primary Care

## 2021-09-15 NOTE — Telephone Encounter (Signed)
Pt called stating that he needs medication amLODipine (NORVASC) 10 MG tablet refilled. Pt states that he only has one pill left, pt states that he never received a 90 day supply only a 30 day supply. Please advise. ?

## 2021-09-16 ENCOUNTER — Other Ambulatory Visit: Payer: Self-pay | Admitting: Primary Care

## 2021-09-16 DIAGNOSIS — I10 Essential (primary) hypertension: Secondary | ICD-10-CM

## 2021-09-16 NOTE — Telephone Encounter (Signed)
Patient called to follow-up on message below as he is out of amlodopine ? ? ?

## 2021-09-16 NOTE — Telephone Encounter (Signed)
I attempted to contact patient via phone, he did not answer. ? ?See the refill request response from me earlier. ?From the chart, he was sent a 1 year supply in May 2022, so he should have a full 90-day supply on file.  I am not sure why he was handed a 30-day supply, he must be refilling from an older prescription. ? ?Please find out what is going on. ?

## 2021-09-16 NOTE — Telephone Encounter (Signed)
Received refill request for amlodipine.  A 1 year supply was provided in May 2022 so he should have another refill on file.  Have him contact his pharmacy for refill. ?

## 2021-09-17 ENCOUNTER — Other Ambulatory Visit: Payer: Self-pay | Admitting: Primary Care

## 2021-09-17 ENCOUNTER — Other Ambulatory Visit: Payer: Self-pay | Admitting: Family Medicine

## 2021-09-17 DIAGNOSIS — I10 Essential (primary) hypertension: Secondary | ICD-10-CM

## 2021-09-17 NOTE — Telephone Encounter (Signed)
Called pharmacy had long conversation with them and they realize that there should be a 90 day refill left. They will fill now. Have called patient and let him know.  ?

## 2021-09-17 NOTE — Telephone Encounter (Signed)
Addressed with pharmacy, patient and provider. Duplicate message.  ?

## 2021-09-17 NOTE — Telephone Encounter (Signed)
Please call his [facility] and find out what's going on with amlodipine.  ?He should have his amlodipine through late May. ?

## 2021-09-20 NOTE — Telephone Encounter (Signed)
Duplicate message. As documented in past message issue has been addressed with pharmacy and patient informed to pick up script. No further acton needed.  ?

## 2021-09-23 ENCOUNTER — Other Ambulatory Visit: Payer: Self-pay | Admitting: Primary Care

## 2021-09-25 ENCOUNTER — Encounter: Payer: Self-pay | Admitting: Oncology

## 2021-10-05 ENCOUNTER — Telehealth: Payer: Self-pay | Admitting: *Deleted

## 2021-10-05 NOTE — Telephone Encounter (Signed)
Patient called and l/m for me to contact him regarding his Xolair and possibly getting same through patient assistance since he no longer has Ins. L/m for patient to contact me ?

## 2021-10-05 NOTE — Telephone Encounter (Signed)
Spoke to patient and will mail [employer] consent to get him on Xolair patient assistance ?

## 2021-10-21 ENCOUNTER — Other Ambulatory Visit: Payer: Self-pay | Admitting: *Deleted

## 2021-10-21 ENCOUNTER — Telehealth: Payer: Self-pay | Admitting: *Deleted

## 2021-10-21 NOTE — Telephone Encounter (Signed)
L/m for patient regarding approval for free Xolair from [employer] and delivery set for 4/21 so he can contact clinic to make appt to restart therapy ?

## 2021-10-28 ENCOUNTER — Other Ambulatory Visit: Payer: Self-pay | Admitting: Primary Care

## 2021-10-28 DIAGNOSIS — I10 Essential (primary) hypertension: Secondary | ICD-10-CM

## 2021-11-11 ENCOUNTER — Other Ambulatory Visit: Payer: Self-pay | Admitting: Primary Care

## 2021-11-11 DIAGNOSIS — I10 Essential (primary) hypertension: Secondary | ICD-10-CM

## 2021-11-17 ENCOUNTER — Ambulatory Visit (INDEPENDENT_AMBULATORY_CARE_PROVIDER_SITE_OTHER): Payer: PPO

## 2021-11-17 DIAGNOSIS — J4550 Severe persistent asthma, uncomplicated: Secondary | ICD-10-CM

## 2021-11-23 ENCOUNTER — Other Ambulatory Visit: Payer: Self-pay | Admitting: Primary Care

## 2021-11-23 DIAGNOSIS — E119 Type 2 diabetes mellitus without complications: Secondary | ICD-10-CM

## 2021-12-01 ENCOUNTER — Ambulatory Visit: Payer: PPO

## 2021-12-02 ENCOUNTER — Encounter: Payer: Self-pay | Admitting: Infectious Diseases

## 2021-12-09 ENCOUNTER — Encounter: Payer: Self-pay | Admitting: Oncology

## 2021-12-16 ENCOUNTER — Encounter: Payer: Self-pay | Admitting: Primary Care

## 2021-12-16 ENCOUNTER — Ambulatory Visit: Payer: Medicaid Other | Admitting: Primary Care

## 2021-12-16 ENCOUNTER — Encounter: Payer: Self-pay | Admitting: Oncology

## 2021-12-16 VITALS — BP 142/84 | HR 98 | Temp 99.0°F | Ht 74.0 in | Wt 291.0 lb

## 2021-12-16 DIAGNOSIS — I890 Lymphedema, not elsewhere classified: Secondary | ICD-10-CM | POA: Diagnosis not present

## 2021-12-16 DIAGNOSIS — M25511 Pain in right shoulder: Secondary | ICD-10-CM | POA: Diagnosis not present

## 2021-12-16 DIAGNOSIS — E1165 Type 2 diabetes mellitus with hyperglycemia: Secondary | ICD-10-CM

## 2021-12-16 DIAGNOSIS — G8929 Other chronic pain: Secondary | ICD-10-CM | POA: Insufficient documentation

## 2021-12-16 LAB — POCT GLYCOSYLATED HEMOGLOBIN (HGB A1C): Hemoglobin A1C: 5.5 % (ref 4.0–5.6)

## 2021-12-16 MED ORDER — PREDNISONE 20 MG PO TABS: ORAL_TABLET | ORAL | 0 refills | Status: DC

## 2021-12-16 NOTE — Patient Instructions (Signed)
Start prednisone tablets. Take two tablets my mouth once daily for four days, then one tablet once daily for four days.   Schedule an appointment with our sports medicine doctor, Dr. Lorelei Pont for your shoulder.  Schedule your physical for January 2024.  It was a pleasure to see you today!

## 2021-12-16 NOTE — Progress Notes (Signed)
Subjective:    Patient ID: Steven [name], male    DOB: 19-Sep-1980, 41 y.o.   MRN: [id number]  Shoulder Pain  Pertinent negatives include no numbness.    Steven [name] is a very pleasant 41 y.o. male with a history of type 2 diabetes, HIV, hypertension, hypothyroidism, asthma, hyperlipidemia who presents today for follow-up of diabetes. He would also like to discuss pedal edema.  1) Type 2 Diabetes:   Current medications include: Metformin XR 1000 mg twice daily, Glipizide XL 5 mg daily  He is checking his blood glucose infrequently and is getting readings ranging low to mid 100's.   Last A1C: 6.7 in January 2023, 5.5 today Last Eye Exam: Due Last Foot Exam: Up-to-date Pneumonia Vaccination: Up-to-date Urine Microalbumin: UTD  Statin: Rosuvastatin  Dietary changes since last visit: He is working on his diet. Is working to cut back on portion sizes.    Exercise: None, some walking.   2) Pedal Edema: He is requesting a work note for permission to wear tennis shoes in the office. He will be starting with [employer] and will be in the office in mid June. Dress code for the office is business casual and he is required to wear dress shoes.   He cannot wear dress shoes due to his chronic lymphedema. He is most comfortable wearing tennis shoes.   3) Shoulder Pain: Chronic to the right shoulder which mostly bothers him with movement. He experiences pain with most plans of ROM, also when laying onto his right side. He's noticed decrease in ROM with pain.  He's tried taking Ibuprofen and Tylenol with temporary improvement. He denies injury or trauma, numbness/tingling, radiation of pain, weakness, neck pain.     Review of Systems  Respiratory:  Negative for shortness of breath.   Cardiovascular:  Positive for leg swelling. Negative for chest pain.  Musculoskeletal:  Positive for arthralgias. Negative for neck pain.  Neurological:  Negative for dizziness, weakness and numbness.          Past Medical History:  Diagnosis Date   Asthma    Cancer ([place])    Colitis 05/27/2011   Diabetes mellitus without complication ([facility])    HIV positive ([place]) 03/23/09   Genotype Y181C   HTN (hypertension)    Kaposi's sarcoma    Syphilis 03/23/09-   1:2    Social History   Socioeconomic History   Marital status: Single    Spouse name: Not on file   Number of children: Not on file   Years of education: Not on file   Highest education level: Not on file  Occupational History   Not on file  Tobacco Use   Smoking status: Never   Smokeless tobacco: Never  Vaping Use   Vaping Use: Never used  Substance and Sexual Activity   Alcohol use: Yes    Alcohol/week: 0.0 standard drinks of alcohol    Comment: socially - less now   Drug use: No   Sexual activity: Not Currently    Partners: Male    Birth control/protection: Condom    Comment: pt. declined condoms  Other Topics Concern   Not on file  Social History Narrative   Single.    No children.   Works in Therapist, art   Enjoys DJ, traveling.    Social Determinants of Health   Financial Resource Strain: Not on file  Food Insecurity: Not on file  Transportation Needs: Not on file  Physical Activity: Not on file  Stress: Not  on file  Social Connections: Not on file  Intimate Partner Violence: Not on file    Past Surgical History:  Procedure Laterality Date   BRAIN SURGERY     IR GENERIC HISTORICAL  02/12/2016   IR REMOVAL TUN ACCESS W/ PORT W/O FL MOD SED 02/12/2016 [name], MD [facility]    Family History  Problem Relation Age of Onset   Pancreatic cancer Mother    Cancer Mother    Diabetes Paternal Grandmother    Thyroid disease Paternal Grandmother     Allergies  Allergen Reactions   Lisinopril Swelling    Swelling of lower lip while on lisinopril   Penicillins Hives and Swelling    REACTION: rash and swelling   Levaquin [Levofloxacin In D5w] Rash   Tessalon [Benzonatate] Rash   Aspirin Nausea  Only    Current Outpatient Medications on File Prior to Visit  Medication Sig Dispense Refill   abacavir-dolutegravir-lamiVUDine (TRIUMEQ) 600-50-300 MG tablet Take 1 tablet by mouth daily. 90 tablet 1   albuterol (VENTOLIN HFA) 108 (90 Base) MCG/ACT inhaler INHALE 2 PUFFS BY MOUTH EVERY 4 TO 6 HOURS AS NEEDED FOR COUGH OR WHEEZING 42.5 g 0   amLODipine (NORVASC) 10 MG tablet Take 1 tablet (10 mg total) by mouth daily. for blood pressure. 90 tablet 1   Blood Glucose Monitoring Suppl (ONETOUCH VERIO) w/Device KIT USE AS DIRECTED TO TEST UP TO FOUR TIMES DAILY     ciclopirox (PENLAC) 8 % solution APPLY 1 COAT TO TOENAIL EVERY DAY FOR 48 WEEKS. REMOVE WEEKLY WITH POLISH REMOVER 6.6 mL 11   EPINEPHRINE 0.3 mg/0.3 mL IJ SOAJ injection INJECT 0.3 MG IN THE MUSCLE AS NEEDED FOR ANAPHYLAXIS 2 each 1   esomeprazole (NEXIUM) 40 MG capsule TAKE ONE CAPSULE BY MOUTH ONCE DAILY AS DIRECTED (Patient taking differently: Take 40 mg by mouth daily.) 30 capsule 0   fexofenadine (ALLEGRA) 180 MG tablet Take 1 tablet (180 mg total) by mouth daily. 15 tablet 0   fluticasone (FLONASE) 50 MCG/ACT nasal spray SHAKE LIQUID AND USE 2 SPRAYS IN EACH NOSTRIL DAILY 16 g 5   fluticasone (FLOVENT HFA) 110 MCG/ACT inhaler Inhale 2 puffs into the lungs 2 (two) times daily. 12 g 5   fluticasone-salmeterol (ADVAIR HFA) 230-21 MCG/ACT inhaler INHALE 2 PUFFS BY MOUTH TWICE DAILY 12 g 0   glipiZIDE (GLUCOTROL XL) 5 MG 24 hr tablet Take 1 tablet (5 mg total) by mouth daily with breakfast. For diabetes. 90 tablet 1   glucose blood test strip Use as instructed to test up to 4 times a day DX E11.9 100 each 3   hydrochlorothiazide (HYDRODIURIL) 25 MG tablet TAKE 1 TABLET BY MOUTH EVERY DAY FOR BLOOD PRESSURE 90 tablet 3   ibuprofen (ADVIL) 800 MG tablet Take 800 mg by mouth 3 (three) times daily as needed.     LANCETS MICRO THIN 33G MISC USE TO TEST BLOOD SUGAR ONCE A DAY 100 each 2   levothyroxine (SYNTHROID) 150 MCG tablet TAKE 1  TABLET BY MOUTH DAILY 90 tablet 1   metFORMIN (GLUCOPHAGE-XR) 500 MG 24 hr tablet TAKE 2 TABLETS BY MOUTH IN THE MORNING AND IN THE EVENING FOR DIABETES 360 tablet 0   montelukast (SINGULAIR) 10 MG tablet TAKE 1 TABLET(10 MG) BY MOUTH DAILY 90 tablet 2   Multiple Vitamin (MULTIVITAMIN) tablet Take 1 tablet by mouth daily.     ONE TOUCH ULTRA TEST test strip TEST BLOOD GLUCOSE THREE TIMES DAILY AS DIRECTED 100 each  2   PAZEO 0.7 % SOLN Place 1 drop into both eyes daily. 1 Bottle 5   potassium chloride SA (KLOR-CON) 20 MEQ tablet Take 1 tablet (20 mEq total) by mouth 2 (two) times daily. 180 tablet 3   rosuvastatin (CRESTOR) 5 MG tablet TAKE 1 TABLET BY MOUTH EVERY DAY FOR CHOLESTEROL 90 tablet 3   Current Facility-Administered Medications on File Prior to Visit  Medication Dose Route Frequency Provider Last Rate Last Admin   omalizumab Arvid Right) injection 300 mg  300 mg Subcutaneous Q14 Days Rexene Alberts M, DO   300 mg at 11/17/21 0853    BP (!) 142/84   Pulse 98   Temp 99 F (37.2 C) (Oral)   Ht 6' 2"  (1.88 m)   Wt 291 lb (132 kg)   SpO2 97%   BMI 37.36 kg/m  Objective:   Physical Exam Cardiovascular:     Rate and Rhythm: Normal rate and regular rhythm.     Comments: Chronic lower extremity and pedal edema noted bilaterally Pulmonary:     Effort: Pulmonary effort is normal.     Breath sounds: Normal breath sounds. No wheezing or rales.  Musculoskeletal:     Right shoulder: No bony tenderness. Decreased range of motion. Normal strength.     Cervical back: Neck supple.     Comments: Mild decrease in range of motion with pain to right shoulder with most planes of movement.  Skin:    General: Skin is warm and dry.  Neurological:     Mental Status: He is alert and oriented to person, place, and time.           Assessment & Plan:   Problem List Items Addressed This Visit       Endocrine   Type 2 diabetes mellitus with hyperglycemia ([place]) - Primary    Controlled with A1c of  5.5 today!  Continue metformin XR 1000 mg twice daily, glipizide XL 5 mg daily.  Pneumonia vaccine up-to-date Urine microalbumin up-to-date Managed on statin  Follow-up in 6 months.      Relevant Orders   POCT glycosylated hemoglobin (Hb A1C) (Completed)     Other   Lymphedema    Chronic and ongoing.  Agree to provide work note to allow patient to wear tennis shoes rather than dress shoes.  He clearly will not be able to fit comfortably in dress shoes.      Chronic shoulder pain    Suspect bursitis given HPI and exam.  He will likely need steroid injection, we will try oral prednisone x1 week.  Continue Tylenol as needed.  He will set up with our sports medicine physician for evaluation.        Relevant Medications   predniSONE (DELTASONE) 20 MG tablet       Pleas [name], NP

## 2021-12-16 NOTE — Assessment & Plan Note (Signed)
Suspect bursitis given HPI and exam.  He will likely need steroid injection, we will try oral prednisone x1 week.  Continue Tylenol as needed.  He will set up with our sports medicine physician for evaluation.

## 2021-12-16 NOTE — Assessment & Plan Note (Signed)
Chronic and ongoing.  Agree to provide work note to allow patient to wear tennis shoes rather than dress shoes.  He clearly will not be able to fit comfortably in dress shoes.

## 2021-12-16 NOTE — Assessment & Plan Note (Signed)
Controlled with A1c of 5.5 today!  Continue metformin XR 1000 mg twice daily, glipizide XL 5 mg daily.  Pneumonia vaccine up-to-date Urine microalbumin up-to-date Managed on statin  Follow-up in 6 months.

## 2021-12-27 NOTE — Telephone Encounter (Signed)
My chart sent to patient to let know you will address after getting back in office. Placed in your box for review.

## 2022-01-06 NOTE — Telephone Encounter (Signed)
Original sent to scan and copy put in mail to home address on file. No further action needed a this time.

## 2022-01-20 ENCOUNTER — Encounter: Payer: Self-pay | Admitting: Oncology

## 2022-01-25 ENCOUNTER — Other Ambulatory Visit: Payer: Self-pay | Admitting: Primary Care

## 2022-01-25 ENCOUNTER — Other Ambulatory Visit: Payer: Self-pay | Admitting: Infectious Diseases

## 2022-01-25 DIAGNOSIS — E119 Type 2 diabetes mellitus without complications: Secondary | ICD-10-CM

## 2022-01-25 DIAGNOSIS — B20 Human immunodeficiency virus [HIV] disease: Secondary | ICD-10-CM

## 2022-01-30 ENCOUNTER — Other Ambulatory Visit: Payer: Self-pay | Admitting: Primary Care

## 2022-01-30 DIAGNOSIS — I10 Essential (primary) hypertension: Secondary | ICD-10-CM

## 2022-01-31 ENCOUNTER — Other Ambulatory Visit: Payer: Self-pay

## 2022-01-31 ENCOUNTER — Encounter: Payer: Self-pay | Admitting: Oncology

## 2022-01-31 ENCOUNTER — Ambulatory Visit: Payer: BC Managed Care – PPO

## 2022-02-02 ENCOUNTER — Encounter: Payer: Self-pay | Admitting: Internal Medicine

## 2022-02-03 ENCOUNTER — Ambulatory Visit: Payer: PPO | Admitting: Primary Care

## 2022-02-08 ENCOUNTER — Other Ambulatory Visit (INDEPENDENT_AMBULATORY_CARE_PROVIDER_SITE_OTHER): Payer: BC Managed Care – PPO

## 2022-02-08 DIAGNOSIS — E890 Postprocedural hypothyroidism: Secondary | ICD-10-CM

## 2022-02-08 LAB — T4, FREE: Free T4: 1.25 ng/dL (ref 0.60–1.60)

## 2022-02-08 LAB — TSH: TSH: 2.39 u[IU]/mL (ref 0.35–5.50)

## 2022-02-10 ENCOUNTER — Telehealth: Payer: Self-pay | Admitting: Primary Care

## 2022-02-10 ENCOUNTER — Ambulatory Visit (INDEPENDENT_AMBULATORY_CARE_PROVIDER_SITE_OTHER): Payer: BC Managed Care – PPO | Admitting: Endocrinology

## 2022-02-10 ENCOUNTER — Encounter: Payer: Self-pay | Admitting: Endocrinology

## 2022-02-10 VITALS — BP 122/82 | HR 90 | Ht 73.5 in | Wt 288.6 lb

## 2022-02-10 DIAGNOSIS — E1169 Type 2 diabetes mellitus with other specified complication: Secondary | ICD-10-CM | POA: Diagnosis not present

## 2022-02-10 DIAGNOSIS — E890 Postprocedural hypothyroidism: Secondary | ICD-10-CM | POA: Diagnosis not present

## 2022-02-10 DIAGNOSIS — E669 Obesity, unspecified: Secondary | ICD-10-CM | POA: Diagnosis not present

## 2022-02-10 DIAGNOSIS — E1165 Type 2 diabetes mellitus with hyperglycemia: Secondary | ICD-10-CM

## 2022-02-10 NOTE — Progress Notes (Signed)
Patient ID: Steven [name], male   DOB: 1980/10/11, 41 y.o.   MRN: [id number]     Reason for Appointment:  Hypothyroidism, follow-up visit    History of Present Illness:   HYPOTHYROIDISM  was first diagnosed around 2013  He previously had hyperthyroidism which was treated with radioactive iodine in 05/2011  He has been given between 150 and 200 mcg of levothyroxine in the last few years based on his labs Taking generic levothyroxine  He does not feel tired or lethargic and no recent weight change  He has been regular with his levothyroxine consistently daily on empty stomach, usually 1 hour before eating Not taking any multivitamins with the medication  His dosage was lowered slightly in 8/22 and now taking 150 mcg levothyroxine, 7-1/2 tablets a week  TSH is again consistently normal   Wt Readings from Last 3 Encounters:  02/10/22 288 lb 9.6 oz (130.9 kg)  12/16/21 291 lb (132 kg)  08/19/21 287 lb (130.2 kg)    Labs:  Lab Results  Component Value Date   TSH 2.39 02/08/2022   TSH 1.13 08/06/2021   TSH 0.25 (L) 02/16/2021   FREET4 1.25 02/08/2022   FREET4 1.34 08/06/2021   FREET4 1.33 02/16/2021    OTHER active problems including diabetes are discussed in review of systems:    Past Medical History:  Diagnosis Date   Asthma    Cancer ([place])    Colitis 05/27/2011   Diabetes mellitus without complication ([place])    HIV positive ([place]) 03/23/09   Genotype Y181C   HTN (hypertension)    Kaposi's sarcoma    Syphilis 03/23/09-   1:2    Past Surgical History:  Procedure Laterality Date   BRAIN SURGERY     IR GENERIC HISTORICAL  02/12/2016   IR REMOVAL TUN ACCESS W/ PORT W/O FL MOD SED 02/12/2016 [name], MD [facility]    Family History  Problem Relation Age of Onset   Pancreatic cancer Mother    Cancer Mother    Diabetes Paternal Grandmother    Thyroid disease Paternal Grandmother     Social History:  reports that he has never smoked. He has  never used smokeless tobacco. He reports current alcohol use. He reports that he does not use drugs.  Allergies:  Allergies  Allergen Reactions   Lisinopril Swelling    Swelling of lower lip while on lisinopril   Penicillins Hives and Swelling    REACTION: rash and swelling   Levaquin [Levofloxacin In D5w] Rash   Tessalon [Benzonatate] Rash   Aspirin Nausea Only    Allergies as of 02/10/2022       Reactions   Lisinopril Swelling   Swelling of lower lip while on lisinopril   Penicillins Hives, Swelling   REACTION: rash and swelling   Levaquin [levofloxacin In D5w] Rash   Tessalon [benzonatate] Rash   Aspirin Nausea Only        Medication List        Accurate as of February 10, 2022  8:41 AM. If you have any questions, ask your nurse or doctor.          Advair HFA 230-21 MCG/ACT inhaler Generic drug: fluticasone-salmeterol INHALE 2 PUFFS BY MOUTH TWICE DAILY   albuterol 108 (90 Base) MCG/ACT inhaler Commonly known as: VENTOLIN HFA INHALE 2 PUFFS BY MOUTH EVERY 4 TO 6 HOURS AS NEEDED FOR COUGH OR WHEEZING   amLODipine 10 MG tablet Commonly known as: NORVASC Take 1 tablet (10  mg total) by mouth daily. for blood pressure.   ciclopirox 8 % solution Commonly known as: PENLAC APPLY 1 COAT TO TOENAIL EVERY DAY FOR 48 WEEKS. REMOVE WEEKLY WITH POLISH REMOVER   EPINEPHrine 0.3 mg/0.3 mL Soaj injection Commonly known as: EPI-PEN INJECT 0.3 MG IN THE MUSCLE AS NEEDED FOR ANAPHYLAXIS   esomeprazole 40 MG capsule Commonly known as: NEXIUM TAKE ONE CAPSULE BY MOUTH ONCE DAILY AS DIRECTED What changed: See the new instructions.   fexofenadine 180 MG tablet Commonly known as: ALLEGRA Take 1 tablet (180 mg total) by mouth daily.   fluticasone 110 MCG/ACT inhaler Commonly known as: Flovent HFA Inhale 2 puffs into the lungs 2 (two) times daily.   fluticasone 50 MCG/ACT nasal spray Commonly known as: FLONASE SHAKE LIQUID AND USE 2 SPRAYS IN EACH NOSTRIL DAILY    glipiZIDE 5 MG 24 hr tablet Commonly known as: GLUCOTROL XL Take 1 tablet (5 mg total) by mouth daily with breakfast. For diabetes.   hydrochlorothiazide 25 MG tablet Commonly known as: HYDRODIURIL TAKE 1 TABLET BY MOUTH EVERY DAY FOR BLOOD PRESSURE   ibuprofen 800 MG tablet Commonly known as: ADVIL Take 800 mg by mouth 3 (three) times daily as needed.   Lancets Micro Thin 33G Misc USE TO TEST BLOOD SUGAR ONCE A DAY   levothyroxine 150 MCG tablet Commonly known as: SYNTHROID TAKE 1 TABLET BY MOUTH DAILY   metFORMIN 500 MG 24 hr tablet Commonly known as: GLUCOPHAGE-XR TAKE 2 TABLETS BY MOUTH IN THE MORNING AND IN THE EVENING FOR DIABETES   montelukast 10 MG tablet Commonly known as: SINGULAIR TAKE 1 TABLET(10 MG) BY MOUTH DAILY   multivitamin tablet Take 1 tablet by mouth daily.   ONE TOUCH ULTRA TEST test strip Generic drug: glucose blood TEST BLOOD GLUCOSE THREE TIMES DAILY AS DIRECTED   glucose blood test strip Use as instructed to test up to 4 times a day DX E11.9   OneTouch Verio w/Device Kit USE AS DIRECTED TO TEST UP TO FOUR TIMES DAILY   Pazeo 0.7 % Soln Generic drug: Olopatadine HCl Place 1 drop into both eyes daily.   potassium chloride SA 20 MEQ tablet Commonly known as: KLOR-CON M TAKE 1 TABLET(20 MEQ) BY MOUTH DAILY   predniSONE 20 MG tablet Commonly known as: DELTASONE Take two tablets my mouth once daily for four days, then one tablet once daily for four days.   rosuvastatin 5 MG tablet Commonly known as: CRESTOR TAKE 1 TABLET BY MOUTH EVERY DAY FOR CHOLESTEROL   Triumeq 600-50-300 MG tablet Generic drug: abacavir-dolutegravir-lamiVUDine TAKE 1 TABLET BY MOUTH DAILY        Review of Systems:    DIABETES: He has been prescribed glipizide and metformin by his PCP Sugar range 70-120 by recall and usually not high after meals Also will occasionally feel hypoglycemic especially if he is skipping his breakfast   Lab Results   Component Value Date   HGBA1C 5.5 12/16/2021   HGBA1C 5.5 Repeated and verified X2. 08/06/2021   HGBA1C 6.7 (H) 10/29/2020   Lab Results  Component Value Date   MICROALBUR 18.6 (H) 08/06/2021   LDLCALC 57 08/06/2021   CREATININE 1.25 08/19/2021     HYPERTENSION:  history of high blood pressure treated by PCP     BP Readings from Last 3 Encounters:  02/10/22 122/82  12/16/21 (!) 142/84  08/19/21 (!) 143/85      Examination:    BP 122/82   Pulse 90   Ht  6' 1.5" (1.867 m)   Wt 288 lb 9.6 oz (130.9 kg)   SpO2 94%   BMI 37.56 kg/m     Assessment/plan and recommendations:    Hypothyroidism, post radioactive iodine  Currently on 150 mcg generic levothyroxine and taking 7-1/2 tablets a week  Subjectively doing well  With his weight loss he is requiring less medication than last year and TSH is consistently normal now Discussed that he can take this 30 minutes before breakfast instead of waiting 60 minutes  Prediabetes: He is getting tendency to hypoglycemia and A1c is only 5.5  Also have sent a message to his PCP that he can likely get by with only 2.5 mg of glipizide ER instead of 5 mg However ideally given his comorbid conditions and history of edema he would likely benefit better from using an SGLT2 drug instead of sulfonylurea If his PCP desires endocrine consultation for diabetes management will be like to see the patient again  There are no Patient Instructions on file for this visit.   Elayne Snare 02/10/2022, 8:41 AM    Note: This office note was prepared with Dragon voice recognition system technology. Any transcriptional errors that result from this process are unintentional.

## 2022-02-10 NOTE — Telephone Encounter (Addendum)
  Please call patient: Notify him that I spoke with his endocrinologist and agree with the dose decrease of his glipizide to 2.5 mg daily.  Notify him that I will send a new prescription of his glipizide to the pharmacy and do not take the glipizide ER 5 mg anymore.  He will also need to be scheduled for his routine physical exam/diabetes follow-up in December 2023.  Please schedule.  Which pharmacy should I use?   ----- Message from Elayne Snare, MD sent at 02/10/2022  8:55 AM EDT ----- Regarding: Glipizide Steven [name]: I was seeing him for his thyroid but he mentioned that at times he feels hypoglycemic, given that his sugars routinely are low normal he can likely try 2.5 mg of glipizide ER, thanks

## 2022-02-10 NOTE — Patient Instructions (Signed)
Same dose 

## 2022-02-10 NOTE — Telephone Encounter (Signed)
Tried to call pt no vm set up

## 2022-02-14 ENCOUNTER — Encounter: Payer: Self-pay | Admitting: Oncology

## 2022-02-15 NOTE — Telephone Encounter (Signed)
Voicemail not set up.

## 2022-02-16 ENCOUNTER — Telehealth: Payer: Self-pay | Admitting: Oncology

## 2022-02-16 ENCOUNTER — Inpatient Hospital Stay: Payer: BC Managed Care – PPO | Admitting: Oncology

## 2022-02-17 ENCOUNTER — Encounter: Payer: Self-pay | Admitting: Oncology

## 2022-02-17 NOTE — Telephone Encounter (Signed)
Attempted to contact patient in regards to rescheduling appointment due to patient leaving a voicemail about cancelling appointment on 8/9. He also advised that he would call back to reschedule.

## 2022-02-20 ENCOUNTER — Other Ambulatory Visit: Payer: Self-pay | Admitting: Primary Care

## 2022-02-20 DIAGNOSIS — E1165 Type 2 diabetes mellitus with hyperglycemia: Secondary | ICD-10-CM

## 2022-02-21 ENCOUNTER — Ambulatory Visit: Payer: PPO

## 2022-02-21 NOTE — Telephone Encounter (Addendum)
Patient called back. Reviewed all information with him. Would like new meds called into [facility]. I have updated in chart.   CPE has been made.

## 2022-02-21 NOTE — Telephone Encounter (Signed)
Called patient voice mail was set up this time. So I have left message. I have also sent my chart to call office. Ok to [employer] we have called x 3

## 2022-02-22 MED ORDER — GLIPIZIDE ER 2.5 MG PO TB24: 2.5000 mg | ORAL_TABLET | Freq: Every day | ORAL | 1 refills | Status: DC

## 2022-02-22 NOTE — Telephone Encounter (Signed)
Noted. Rx for Glipizide XL 2.5 mg sent to pharmacy.

## 2022-02-22 NOTE — Addendum Note (Signed)
Addended by: Pleas [name] on: 02/22/2022 06:52 AM   Modules accepted: Orders

## 2022-03-03 ENCOUNTER — Other Ambulatory Visit: Payer: Self-pay | Admitting: Primary Care

## 2022-03-03 DIAGNOSIS — E119 Type 2 diabetes mellitus without complications: Secondary | ICD-10-CM

## 2022-03-07 ENCOUNTER — Ambulatory Visit: Payer: Self-pay

## 2022-03-11 ENCOUNTER — Encounter: Payer: Self-pay | Admitting: Oncology

## 2022-03-16 ENCOUNTER — Ambulatory Visit (INDEPENDENT_AMBULATORY_CARE_PROVIDER_SITE_OTHER): Payer: BC Managed Care – PPO | Admitting: *Deleted

## 2022-03-16 DIAGNOSIS — J4550 Severe persistent asthma, uncomplicated: Secondary | ICD-10-CM

## 2022-03-16 NOTE — Progress Notes (Signed)
Immunotherapy   Patient Details  Name: Steven [name] MRN: [id number] Date of Birth: 06/23/1981  03/16/2022  [name] Restarted injections for Xolair  Frequency: Every 2 Weeks  Epi-Pen: yes  Consent signed and patient instructions given. Patient restarted Xolair and receive 300 mg dose. Patient left without waiting. Patient signed new consent.    Steven [name] 03/16/2022, 8:57 AM

## 2022-03-18 ENCOUNTER — Other Ambulatory Visit: Payer: Self-pay

## 2022-03-18 DIAGNOSIS — E890 Postprocedural hypothyroidism: Secondary | ICD-10-CM

## 2022-03-18 MED ORDER — LEVOTHYROXINE SODIUM 150 MCG PO TABS: 150.0000 ug | ORAL_TABLET | Freq: Every day | ORAL | 1 refills | Status: DC

## 2022-03-21 DIAGNOSIS — H53143 Visual discomfort, bilateral: Secondary | ICD-10-CM | POA: Diagnosis not present

## 2022-03-21 LAB — HM DIABETES EYE EXAM

## 2022-03-22 ENCOUNTER — Telehealth: Payer: Self-pay | Admitting: *Deleted

## 2022-03-22 ENCOUNTER — Encounter: Payer: Self-pay | Admitting: Primary Care

## 2022-03-22 NOTE — Telephone Encounter (Signed)
Left VM for Steven [name] to call office to reschedule the missed August appointment.

## 2022-03-25 ENCOUNTER — Encounter: Payer: Self-pay | Admitting: *Deleted

## 2022-03-29 ENCOUNTER — Encounter: Payer: Self-pay | Admitting: Oncology

## 2022-03-30 ENCOUNTER — Ambulatory Visit (INDEPENDENT_AMBULATORY_CARE_PROVIDER_SITE_OTHER): Payer: BC Managed Care – PPO | Admitting: *Deleted

## 2022-03-30 DIAGNOSIS — J4550 Severe persistent asthma, uncomplicated: Secondary | ICD-10-CM | POA: Diagnosis not present

## 2022-04-01 ENCOUNTER — Ambulatory Visit (HOSPITAL_COMMUNITY)
Admission: EM | Admit: 2022-04-01 | Discharge: 2022-04-01 | Disposition: A | Discharge status: Home / Self Care | Payer: BC Managed Care – PPO | Attending: Family Medicine | Admitting: Family Medicine

## 2022-04-01 ENCOUNTER — Encounter (HOSPITAL_COMMUNITY): Payer: Self-pay

## 2022-04-01 DIAGNOSIS — T7840XA Allergy, unspecified, initial encounter: Secondary | ICD-10-CM | POA: Diagnosis not present

## 2022-04-01 MED ORDER — METHYLPREDNISOLONE 4 MG PO TBPK: ORAL_TABLET | ORAL | 0 refills | Status: DC

## 2022-04-01 MED ORDER — DIPHENHYDRAMINE HCL 25 MG PO CAPS
50.0000 mg | ORAL_CAPSULE | Freq: Once | ORAL | Status: AC
  Administered 2022-04-01: 50 mg via ORAL

## 2022-04-01 MED ORDER — DIPHENHYDRAMINE HCL 25 MG PO CAPS
ORAL_CAPSULE | ORAL | Status: AC
  Filled 2022-04-01: qty 2

## 2022-04-01 MED ORDER — METHYLPREDNISOLONE SODIUM SUCC 125 MG IJ SOLR
INTRAMUSCULAR | Status: AC
  Filled 2022-04-01: qty 2

## 2022-04-01 MED ORDER — METHYLPREDNISOLONE SODIUM SUCC 125 MG IJ SOLR
125.0000 mg | Freq: Once | INTRAMUSCULAR | Status: AC
  Administered 2022-04-01: 125 mg via INTRAMUSCULAR

## 2022-04-01 NOTE — ED Provider Notes (Signed)
[facility]    CSN: [id number] Arrival date & time: 04/01/22  1442      History   Chief Complaint Chief Complaint  Patient presents with   Allergic Reaction    HPI Steven [name] is a 41 y.o. male.   Patient is here for an "allergic reaction".  Earlier today while at work started with what felt like an allergic reaction.  Palms itchy,  swelling around the eyelid right eyelid.   Throbbing around the tongue, gums, tightness/swelling around the lips.  He has not used his epipen or any benadryl at this time.  No difficulty swallowing or breathing.   He has certain allergies to foods, meds, environmental.  Nothing that he is aware of set this off.        Past Medical History:  Diagnosis Date   Asthma    Cancer ([place])    Colitis 05/27/2011   Diabetes mellitus without complication ([facility])    HIV positive ([place]) 03/23/09   Genotype Y181C   HTN (hypertension)    Kaposi's sarcoma    Syphilis 03/23/09-   1:2    Patient Active Problem List   Diagnosis Date Noted   Chronic shoulder pain 12/16/2021   Hypokalemia 08/06/2021   Preoperative clearance 10/29/2020   Hyperlipidemia 05/28/2020   Preventative health care 08/10/2018   Severe persistent asthma without complication 70/26/3785   Allergic reaction 06/15/2018   Food allergy 06/15/2018   Seasonal and perennial allergic rhinoconjunctivitis 06/15/2018   HIV infection ([place]) 02/09/2018   Gastroesophageal reflux disease 03/28/2017   Type 2 diabetes mellitus with hyperglycemia ([place]) 03/25/2015   CKD (chronic kidney disease) stage 3, GFR 30-59 ml/min ([place]) 03/23/2015   Thrush, oral 03/23/2015   Essential hypertension 11/24/2014   Hypothyroidism, postradioiodine therapy 01/13/2014   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 Authorizing Provider  albuterol (VENTOLIN HFA) 108 (90 Base) MCG/ACT inhaler INHALE 2 PUFFS BY MOUTH EVERY 4 TO 6 HOURS AS NEEDED FOR COUGH OR WHEEZING 02/19/21   Ambs, Kathrine Cords, FNP  amLODipine (NORVASC) 10 MG tablet Take 1 tablet (10 mg total) by mouth daily. for blood pressure. 11/12/21   Pleas [name], NP  Blood Glucose Monitoring Suppl (ONETOUCH VERIO) w/Device KIT USE AS DIRECTED TO TEST UP TO FOUR TIMES DAILY 02/19/21   [provider]  ciclopirox (PENLAC) 8 % solution APPLY 1 COAT TO TOENAIL EVERY DAY FOR 48 WEEKS. REMOVE WEEKLY WITH POLISH REMOVER 06/22/21   [place], Stephani Police, DPM  EPINEPHRINE 0.3 mg/0.3 mL IJ SOAJ injection INJECT 0.3 MG IN THE MUSCLE AS NEEDED FOR ANAPHYLAXIS 07/08/21   Ambs, Kathrine Cords, FNP  esomeprazole (NEXIUM) 40 MG capsule TAKE ONE CAPSULE BY MOUTH ONCE DAILY AS DIRECTED Patient taking differently: Take 40 mg by mouth daily. 02/06/17   Kozlow, Donnamarie Poag, MD  fexofenadine (ALLEGRA) 180 MG tablet Take 1 tablet (180 mg total) by mouth daily. 05/03/12   Harden Mo, MD  fluticasone (FLONASE) 50 MCG/ACT nasal spray SHAKE  LIQUID AND USE 2 SPRAYS IN EACH NOSTRIL DAILY 09/17/21   Ambs, Kathrine Cords, FNP  fluticasone (FLOVENT HFA) 110 MCG/ACT inhaler Inhale 2 puffs into the lungs 2 (two) times daily. 02/03/21   Dara Hoyer, FNP  fluticasone-salmeterol (ADVAIR HFA) 702 838 1931 MCG/ACT inhaler INHALE 2 PUFFS BY MOUTH TWICE DAILY 02/03/21   Ambs, Kathrine Cords, FNP  glipiZIDE (GLUCOTROL XL) 2.5 MG 24 hr tablet Take 1 tablet (2.5 mg total) by mouth daily with breakfast. for diabetes. 02/22/22   Pleas [name], NP  glucose blood (ONETOUCH ULTRA) test strip USE AS DIRECTED UP TO FOUR TIMES DAILY. 02/20/22   Pleas [name], NP   glucose blood test strip Use as instructed to test up to 4 times a day DX E11.9 01/22/21   Pleas [name], NP  hydrochlorothiazide (HYDRODIURIL) 25 MG tablet TAKE 1 TABLET BY MOUTH EVERY DAY FOR BLOOD PRESSURE 04/06/21   Pleas [name], NP  ibuprofen (ADVIL) 800 MG tablet Take 800 mg by mouth 3 (three) times daily as needed. 01/20/21   [provider]  LANCETS MICRO THIN 33G MISC USE TO TEST BLOOD SUGAR ONCE A DAY 08/21/17   Tresa Garter, MD  levothyroxine (SYNTHROID) 150 MCG tablet Take 1 tablet (150 mcg total) by mouth daily. 03/18/22   Elayne Snare, MD  metFORMIN (GLUCOPHAGE-XR) 500 MG 24 hr tablet TAKE 2 TABLETS BY MOUTH IN THE MORNING AND IN THE EVENING FOR DIABETES 01/25/22   Pleas [name], NP  montelukast (SINGULAIR) 10 MG tablet TAKE 1 TABLET(10 MG) BY MOUTH DAILY 02/03/21   Ambs, Kathrine Cords, FNP  Multiple Vitamin (MULTIVITAMIN) tablet Take 1 tablet by mouth daily.    [provider]  ONE TOUCH ULTRA TEST test strip TEST BLOOD GLUCOSE THREE TIMES DAILY AS DIRECTED 10/12/18   Pleas [name], NP  PAZEO 0.7 % SOLN Place 1 drop into both eyes daily. 02/08/18   Kennith Gain, MD  potassium chloride SA (KLOR-CON M) 20 MEQ tablet TAKE 1 TABLET(20 MEQ) BY MOUTH DAILY 01/30/22   Pleas [name], NP  predniSONE (DELTASONE) 20 MG tablet Take two tablets my mouth once daily for four days, then one tablet once daily for four days. 12/16/21   Pleas [name], NP  rosuvastatin (CRESTOR) 5 MG tablet TAKE 1 TABLET BY MOUTH EVERY DAY FOR CHOLESTEROL 08/31/21   Pleas [name], NP  TRIUMEQ 600-50-300 MG tablet TAKE 1 TABLET BY MOUTH DAILY 01/25/22   Campbell Riches, MD    Family History Family History  Problem Relation Age of Onset   Pancreatic cancer Mother    Cancer Mother    Diabetes Paternal Grandmother    Thyroid disease Paternal Grandmother     Social History Social History   Tobacco Use   Smoking status: Never   Smokeless tobacco: Never   Vaping Use   Vaping Use: Never used  Substance Use Topics   Alcohol use: Yes    Alcohol/week: 0.0 standard drinks of alcohol    Comment: socially - less now   Drug use: No     Allergies   Lisinopril, Penicillins, Levaquin [levofloxacin in d5w], Tessalon [benzonatate], and Aspirin   Review of Systems Review of Systems  Constitutional: Negative.   HENT:  Positive for facial swelling. Negative for tinnitus and trouble swallowing.   Eyes: Negative.   Respiratory: Negative.    Cardiovascular: Negative.   Gastrointestinal: Negative.   Genitourinary: Negative.   Musculoskeletal: Negative.   Allergic/Immunologic: Positive for environmental allergies  and food allergies.  Psychiatric/Behavioral: Negative.       Physical Exam Triage Vital Signs ED Triage Vitals [04/01/22 1449]  Enc Vitals Group     BP (!) 141/96     Pulse Rate 76     Resp 18     Temp 98.4 F (36.9 C)     Temp Source Oral     SpO2 98 %     Weight      Height      Head Circumference      Peak Flow      Pain Score      Pain Loc      Pain Edu?      Excl. in [place]?    No data found.  Updated Vital Signs BP (!) 141/96   Pulse 76   Temp 98.4 F (36.9 C) (Oral)   Resp 18   SpO2 98%   Visual Acuity Right Eye Distance:   Left Eye Distance:   Bilateral Distance:    Right Eye Near:   Left Eye Near:    Bilateral Near:     Physical Exam Constitutional:      Appearance: Normal appearance.  HENT:     Head: Normocephalic.     Comments: Slight fullness at the right temple;  Slight fullness just inferior to the lower lip on the left and right;     Mouth/Throat:     Lips: Pink.     Mouth: Mucous membranes are moist.     Tongue: No lesions.     Pharynx: No pharyngeal swelling, oropharyngeal exudate, posterior oropharyngeal erythema or uvula swelling.  Cardiovascular:     Rate and Rhythm: Normal rate and regular rhythm.     Pulses: Normal pulses.  Pulmonary:     Effort: Pulmonary effort is normal. No  respiratory distress.     Breath sounds: Normal breath sounds. No wheezing or rhonchi.  Musculoskeletal:     Cervical back: Normal range of motion and neck supple.  Skin:    General: Skin is warm and dry.     Findings: No rash.  Neurological:     General: No focal deficit present.     Mental Status: He is alert.  Psychiatric:        Mood and Affect: Mood normal.      UC Treatments / Results  Labs (all labs ordered are listed, but only abnormal results are displayed) Labs Reviewed - No data to display  EKG   Radiology No results found.  Procedures Procedures (including critical care time)  Medications Ordered in UC Medications  methylPREDNISolone sodium succinate (SOLU-MEDROL) 125 mg/2 mL injection 125 mg (has no administration in time range)  diphenhydrAMINE (BENADRYL) capsule 50 mg (has no administration in time range)    Initial Impression / Assessment and Plan / UC Course  I have reviewed the triage vital signs and the nursing notes.  Pertinent labs & imaging results that were available during my care of the patient were reviewed by me and considered in my medical decision making (see chart for details).    Final Clinical Impressions(s) / UC Diagnoses   Final diagnoses:  Allergic reaction, initial encounter     Discharge Instructions      You were seen today for an allergic reaction.  You were given a shot of a steroid today as well as oral benadryl.  I have sent out an steroid pack for you.  Please start this tomorrow.  You may take over  the counter benadryl as well to help with symptoms.  Please monitor your blood sugar as your sugars will likely go up with medications.  If you have worsening swelling, itching, or develop trouble swallowing or shortness of breath then please go to the ER or call 911.     ED Prescriptions     Medication Sig Dispense Auth. Provider   methylPREDNISolone (MEDROL DOSEPAK) 4 MG TBPK tablet Take as directed 1 each Rondel Oh, MD      PDMP not reviewed this encounter.   Rondel Oh, MD 04/01/22 1536

## 2022-04-01 NOTE — ED Triage Notes (Signed)
Pt reports his lips and eyes are swollen. Pt reports he feels like he is having an allergic reaction. Pt reports he has not done anything different to cause the reaction.

## 2022-04-01 NOTE — Discharge Instructions (Addendum)
You were seen today for an allergic reaction.  You were given a shot of a steroid today as well as oral benadryl.  I have sent out an steroid pack for you.  Please start this tomorrow.  You may take over the counter benadryl as well to help with symptoms.  Please monitor your blood sugar as your sugars will likely go up with medications.  If you have worsening swelling, itching, or develop trouble swallowing or shortness of breath then please go to the ER or call 911.

## 2022-04-12 ENCOUNTER — Other Ambulatory Visit: Payer: Self-pay | Admitting: Primary Care

## 2022-04-12 DIAGNOSIS — I10 Essential (primary) hypertension: Secondary | ICD-10-CM

## 2022-04-13 ENCOUNTER — Encounter: Payer: Self-pay | Admitting: Oncology

## 2022-04-13 ENCOUNTER — Ambulatory Visit: Payer: BC Managed Care – PPO

## 2022-04-25 ENCOUNTER — Ambulatory Visit: Payer: BC Managed Care – PPO | Admitting: Podiatry

## 2022-05-06 DIAGNOSIS — M25511 Pain in right shoulder: Secondary | ICD-10-CM | POA: Diagnosis not present

## 2022-05-13 ENCOUNTER — Encounter: Payer: Self-pay | Admitting: Oncology

## 2022-05-16 ENCOUNTER — Other Ambulatory Visit: Payer: Medicaid Other

## 2022-05-16 ENCOUNTER — Other Ambulatory Visit: Payer: Self-pay

## 2022-05-16 DIAGNOSIS — B20 Human immunodeficiency virus [HIV] disease: Secondary | ICD-10-CM

## 2022-05-16 DIAGNOSIS — Z79899 Other long term (current) drug therapy: Secondary | ICD-10-CM

## 2022-05-16 DIAGNOSIS — Z113 Encounter for screening for infections with a predominantly sexual mode of transmission: Secondary | ICD-10-CM

## 2022-05-18 ENCOUNTER — Emergency Department (HOSPITAL_COMMUNITY)
Admission: EM | Admit: 2022-05-18 | Discharge: 2022-05-19 | Disposition: A | Discharge status: Home / Self Care | Payer: BC Managed Care – PPO | Attending: Emergency Medicine | Admitting: Emergency Medicine

## 2022-05-18 ENCOUNTER — Encounter (HOSPITAL_COMMUNITY): Payer: Self-pay | Admitting: *Deleted

## 2022-05-18 ENCOUNTER — Other Ambulatory Visit: Payer: Self-pay

## 2022-05-18 DIAGNOSIS — E876 Hypokalemia: Secondary | ICD-10-CM | POA: Diagnosis not present

## 2022-05-18 DIAGNOSIS — I10 Essential (primary) hypertension: Secondary | ICD-10-CM | POA: Diagnosis not present

## 2022-05-18 DIAGNOSIS — J45909 Unspecified asthma, uncomplicated: Secondary | ICD-10-CM | POA: Insufficient documentation

## 2022-05-18 DIAGNOSIS — R739 Hyperglycemia, unspecified: Secondary | ICD-10-CM | POA: Diagnosis not present

## 2022-05-18 DIAGNOSIS — J069 Acute upper respiratory infection, unspecified: Secondary | ICD-10-CM | POA: Diagnosis not present

## 2022-05-18 DIAGNOSIS — Z20822 Contact with and (suspected) exposure to covid-19: Secondary | ICD-10-CM | POA: Insufficient documentation

## 2022-05-18 DIAGNOSIS — B20 Human immunodeficiency virus [HIV] disease: Secondary | ICD-10-CM | POA: Diagnosis not present

## 2022-05-18 DIAGNOSIS — E1165 Type 2 diabetes mellitus with hyperglycemia: Secondary | ICD-10-CM | POA: Diagnosis not present

## 2022-05-18 DIAGNOSIS — Z7984 Long term (current) use of oral hypoglycemic drugs: Secondary | ICD-10-CM | POA: Diagnosis not present

## 2022-05-18 DIAGNOSIS — Z7951 Long term (current) use of inhaled steroids: Secondary | ICD-10-CM | POA: Diagnosis not present

## 2022-05-18 DIAGNOSIS — R059 Cough, unspecified: Secondary | ICD-10-CM | POA: Diagnosis not present

## 2022-05-18 DIAGNOSIS — E039 Hypothyroidism, unspecified: Secondary | ICD-10-CM | POA: Diagnosis not present

## 2022-05-18 DIAGNOSIS — Z79899 Other long term (current) drug therapy: Secondary | ICD-10-CM | POA: Insufficient documentation

## 2022-05-18 DIAGNOSIS — E860 Dehydration: Secondary | ICD-10-CM | POA: Insufficient documentation

## 2022-05-18 LAB — CBC WITH DIFFERENTIAL/PLATELET
Abs Immature Granulocytes: 0.02 10*3/uL (ref 0.00–0.07)
Basophils Absolute: 0.1 10*3/uL (ref 0.0–0.1)
Basophils Relative: 1 %
Eosinophils Absolute: 0.1 10*3/uL (ref 0.0–0.5)
Eosinophils Relative: 2 %
HCT: 51.5 % (ref 39.0–52.0)
Hemoglobin: 16.5 g/dL (ref 13.0–17.0)
Immature Granulocytes: 0 %
Lymphocytes Relative: 39 %
Lymphs Abs: 2.6 10*3/uL (ref 0.7–4.0)
MCH: 25.9 pg — ABNORMAL LOW (ref 26.0–34.0)
MCHC: 32 g/dL (ref 30.0–36.0)
MCV: 81 fL (ref 80.0–100.0)
Monocytes Absolute: 0.4 10*3/uL (ref 0.1–1.0)
Monocytes Relative: 7 %
Neutro Abs: 3.3 10*3/uL (ref 1.7–7.7)
Neutrophils Relative %: 51 %
Platelets: 216 10*3/uL (ref 150–400)
RBC: 6.36 MIL/uL — ABNORMAL HIGH (ref 4.22–5.81)
RDW: 13.2 % (ref 11.5–15.5)
WBC: 6.5 10*3/uL (ref 4.0–10.5)
nRBC: 0 % (ref 0.0–0.2)

## 2022-05-18 LAB — COMPREHENSIVE METABOLIC PANEL
ALT: 20 U/L (ref 0–44)
AST: 23 U/L (ref 15–41)
Albumin: 4.4 g/dL (ref 3.5–5.0)
Alkaline Phosphatase: 49 U/L (ref 38–126)
Anion gap: 12 (ref 5–15)
BUN: 14 mg/dL (ref 6–20)
CO2: 30 mmol/L (ref 22–32)
Calcium: 9.6 mg/dL (ref 8.9–10.3)
Chloride: 95 mmol/L — ABNORMAL LOW (ref 98–111)
Creatinine, Ser: 1.39 mg/dL — ABNORMAL HIGH (ref 0.61–1.24)
GFR, Estimated: >60 mL/min (ref >60)
Glucose, Bld: 252 mg/dL — ABNORMAL HIGH (ref 70–99)
Potassium: 2.6 mmol/L — CL (ref 3.5–5.1)
Sodium: 137 mmol/L (ref 135–145)
Total Bilirubin: 1.5 mg/dL — ABNORMAL HIGH (ref 0.3–1.2)
Total Protein: 7.4 g/dL (ref 6.5–8.1)

## 2022-05-18 LAB — URINALYSIS, ROUTINE W REFLEX MICROSCOPIC
Bacteria, UA: NONE SEEN
Bilirubin Urine: NEGATIVE
Glucose, UA: >=500 mg/dL — AB
Hgb urine dipstick: NEGATIVE
Ketones, ur: NEGATIVE mg/dL
Leukocytes,Ua: NEGATIVE
Nitrite: NEGATIVE
Protein, ur: 30 mg/dL — AB
Specific Gravity, Urine: 1.018 (ref 1.005–1.030)
pH: 5 (ref 5.0–8.0)

## 2022-05-18 LAB — I-STAT CREATININE, ED: Creatinine, Ser: 1.4 mg/dL — ABNORMAL HIGH (ref 0.61–1.24)

## 2022-05-18 LAB — CBG MONITORING, ED: Glucose-Capillary: 248 mg/dL — ABNORMAL HIGH (ref 70–99)

## 2022-05-18 NOTE — ED Provider Triage Note (Signed)
Emergency Medicine Provider Triage Evaluation Note  Steven [name] , a 41 y.o. male  was evaluated in triage.  Pt complains of hyperglycemia running blood sugars levels around the 300s, was 396 prior to arrival.  Reports he did go out of town this past weekend for his birthday and had some things that he should not had.  Has been taking his medication as prescribed however blood sugar continues to run very high.  Patient does report being sick from a cold, he is now recovering from this but cannot get his blood sugar within normal limits.  Some diarrhea but no episodes of vomiting.  Review of Systems  Positive: Nausea, diarrhea Negative: fever  Physical Exam  Ht 6' 1.5" (1.867 m)   Wt 130.9 kg   BMI 37.56 kg/m  Gen:   Awake, no distress   Resp:  Normal effort  MSK:   Moves extremities without difficulty  Other:    Medical Decision Making  Medically screening exam initiated at 4:30 PM.  Appropriate orders placed.  Jaxson Anglin was informed that the remainder of the evaluation will be completed by another provider, this initial triage assessment does not replace that evaluation, and the importance of remaining in the ED until their evaluation is complete.     Janeece Fitting, PA-C 05/18/22 1634

## 2022-05-18 NOTE — ED Triage Notes (Signed)
The pts blood sugar has been  running high for several days  he was on vacation and was eating a lot of food

## 2022-05-19 ENCOUNTER — Emergency Department (HOSPITAL_COMMUNITY): Payer: BC Managed Care – PPO

## 2022-05-19 DIAGNOSIS — E1165 Type 2 diabetes mellitus with hyperglycemia: Secondary | ICD-10-CM | POA: Diagnosis not present

## 2022-05-19 DIAGNOSIS — R059 Cough, unspecified: Secondary | ICD-10-CM | POA: Diagnosis not present

## 2022-05-19 LAB — TSH: TSH: 6.046 u[IU]/mL — ABNORMAL HIGH (ref 0.350–4.500)

## 2022-05-19 LAB — RESP PANEL BY RT-PCR (FLU A&B, COVID) ARPGX2
Influenza A by PCR: NEGATIVE
Influenza B by PCR: NEGATIVE
SARS Coronavirus 2 by RT PCR: NEGATIVE

## 2022-05-19 LAB — MAGNESIUM: Magnesium: 1.6 mg/dL — ABNORMAL LOW (ref 1.7–2.4)

## 2022-05-19 LAB — CBG MONITORING, ED: Glucose-Capillary: 251 mg/dL — ABNORMAL HIGH (ref 70–99)

## 2022-05-19 MED ORDER — POTASSIUM CHLORIDE CRYS ER 20 MEQ PO TBCR
40.0000 meq | EXTENDED_RELEASE_TABLET | Freq: Once | ORAL | Status: AC
  Administered 2022-05-19: 40 meq via ORAL
  Filled 2022-05-19: qty 2

## 2022-05-19 MED ORDER — SODIUM CHLORIDE 0.9 % IV BOLUS
1000.0000 mL | Freq: Once | INTRAVENOUS | Status: AC
  Administered 2022-05-19: 1000 mL via INTRAVENOUS

## 2022-05-19 MED ORDER — MAGNESIUM SULFATE 2 GM/50ML IV SOLN
2.0000 g | Freq: Once | INTRAVENOUS | Status: AC
  Administered 2022-05-19: 2 g via INTRAVENOUS
  Filled 2022-05-19: qty 50

## 2022-05-19 MED ORDER — GUAIFENESIN 100 MG/5ML PO LIQD: 5.0000 mL | ORAL | 0 refills | Status: DC | PRN

## 2022-05-19 MED ORDER — POTASSIUM CHLORIDE 10 MEQ/100ML IV SOLN
10.0000 meq | Freq: Once | INTRAVENOUS | Status: AC
  Administered 2022-05-19: 10 meq via INTRAVENOUS
  Filled 2022-05-19: qty 100

## 2022-05-19 NOTE — ED Provider Notes (Signed)
[facility] EMERGENCY DEPARTMENT Provider Note   CSN: [id number] Arrival date & time: 05/18/22  1527     History  Chief Complaint  Patient presents with   Hyperglycemia    Steven [name] is a 41 y.o. male.  Pt is a 41 yo male with a pmhx significant for DM2, HTN,  AIDS (hx Kaposi's sarcoma), asthma, and hypothyroidism. Most recent viral load is <20, but CD4 count was very low at 21.  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  Pt is followed by Dr. Learta Codding (oncology) for his KS which is in remission.  AIDS is followed by Dr. Johnnye Sima (ID).  Pt has been compliant with meds.   Pt said he's had a cough for several days which won't go away.  He did go to [place], [place] for his [place] and was eating more than usual.  BS has been elevated for several days.  No fevers.  Pt feels very weak.       Home Medications Prior to Admission medications   Medication Sig Start Date End Date Taking? Authorizing Provider  guaiFENesin (ROBITUSSIN) 100 MG/5ML liquid Take 5 mLs by mouth every 4 (four) hours as needed for cough or to loosen phlegm. 05/19/22  Yes Isla Pence, MD  albuterol (VENTOLIN HFA) 108 (90 Base) MCG/ACT inhaler INHALE 2 PUFFS BY MOUTH EVERY 4 TO 6 HOURS AS NEEDED FOR COUGH OR WHEEZING 02/19/21   Ambs, Kathrine Cords, FNP  amLODipine (NORVASC) 10 MG tablet Take 1 tablet (10 mg total) by mouth daily. for blood pressure. 11/12/21   Pleas [name], NP  Blood Glucose Monitoring Suppl (ONETOUCH VERIO) w/Device KIT USE AS DIRECTED TO TEST UP TO FOUR TIMES DAILY 02/19/21   [provider]  ciclopirox (PENLAC) 8 % solution APPLY 1 COAT TO TOENAIL EVERY DAY FOR 48 WEEKS. REMOVE WEEKLY WITH POLISH REMOVER 06/22/21   Galaway, Stephani Police, DPM  EPINEPHRINE 0.3 mg/0.3 mL IJ SOAJ injection INJECT 0.3 MG IN THE MUSCLE AS NEEDED FOR ANAPHYLAXIS 07/08/21   Ambs, Kathrine Cords, FNP  esomeprazole (NEXIUM) 40 MG capsule TAKE ONE CAPSULE BY MOUTH ONCE DAILY AS  DIRECTED Patient taking differently: Take 40 mg by mouth daily. 02/06/17   Kozlow, Donnamarie Poag, MD  fexofenadine (ALLEGRA) 180 MG tablet Take 1 tablet (180 mg total) by mouth daily. 05/03/12   Harden Mo, MD  fluticasone (FLONASE) 50 MCG/ACT nasal spray SHAKE LIQUID AND USE 2 SPRAYS IN EACH NOSTRIL DAILY 09/17/21   Ambs, Kathrine Cords, FNP  fluticasone (FLOVENT HFA) 110 MCG/ACT inhaler Inhale 2 puffs into the lungs 2 (two) times daily. 02/03/21   Dara Hoyer, FNP  fluticasone-salmeterol (ADVAIR HFA) [phone] MCG/ACT inhaler INHALE 2 PUFFS BY MOUTH TWICE DAILY 02/03/21   Ambs, Kathrine Cords, FNP  glipiZIDE (GLUCOTROL XL) 2.5 MG 24 hr tablet Take 1 tablet (2.5 mg total) by mouth daily with breakfast. for diabetes. 02/22/22   Pleas [name], NP  glucose blood (ONETOUCH ULTRA) test strip USE AS DIRECTED UP TO FOUR TIMES DAILY. 02/20/22   Pleas [name], NP  glucose blood test strip Use as instructed to test up to 4 times a day DX E11.9 01/22/21   Pleas [name], NP  hydrochlorothiazide (HYDRODIURIL) 25 MG tablet Take 1 tablet (25 mg total) by mouth daily. for blood pressure. 04/12/22   Pleas [name], NP  ibuprofen (ADVIL) 800 MG tablet Take 800 mg by mouth 3 (three) times daily as needed. 01/20/21  [provider]  LANCETS MICRO THIN 33G MISC USE TO TEST BLOOD SUGAR ONCE A DAY 08/21/17   Tresa Garter, MD  levothyroxine (SYNTHROID) 150 MCG tablet Take 1 tablet (150 mcg total) by mouth daily. 03/18/22   Elayne Snare, MD  metFORMIN (GLUCOPHAGE-XR) 500 MG 24 hr tablet TAKE 2 TABLETS BY MOUTH IN THE MORNING AND IN THE EVENING FOR DIABETES 01/25/22   Pleas [name], NP  methylPREDNISolone (MEDROL DOSEPAK) 4 MG TBPK tablet Take as directed 04/01/22   Rondel Oh, MD  montelukast (SINGULAIR) 10 MG tablet TAKE 1 TABLET(10 MG) BY MOUTH DAILY 02/03/21   Ambs, Kathrine Cords, FNP  Multiple Vitamin (MULTIVITAMIN) tablet Take 1 tablet by mouth daily.    [provider]  ONE TOUCH ULTRA TEST test strip TEST  BLOOD GLUCOSE THREE TIMES DAILY AS DIRECTED 10/12/18   Pleas [name], NP  PAZEO 0.7 % SOLN Place 1 drop into both eyes daily. 02/08/18   Kennith Gain, MD  potassium chloride SA (KLOR-CON M) 20 MEQ tablet TAKE 1 TABLET(20 MEQ) BY MOUTH DAILY 01/30/22   Pleas [name], NP  predniSONE (DELTASONE) 20 MG tablet Take two tablets my mouth once daily for four days, then one tablet once daily for four days. 12/16/21   Pleas [name], NP  rosuvastatin (CRESTOR) 5 MG tablet TAKE 1 TABLET BY MOUTH EVERY DAY FOR CHOLESTEROL 08/31/21   Pleas [name], NP  TRIUMEQ 600-50-300 MG tablet TAKE 1 TABLET BY MOUTH DAILY 01/25/22   Campbell Riches, MD      Allergies    Lisinopril, Penicillins, Levaquin [levofloxacin in d5w], Tessalon [benzonatate], and Aspirin    Review of Systems   Review of Systems  Constitutional:  Positive for fatigue.  Respiratory:  Positive for cough.   All other systems reviewed and are negative.   Physical Exam Updated Vital Signs BP (!) 138/90 (BP Location: Left Arm)   Pulse 79   Temp 98.3 F (36.8 C) (Oral)   Resp 17   Ht 6' 1.5" (1.867 m)   Wt 130.9 kg   SpO2 97%   BMI 37.56 kg/m  Physical Exam Vitals and nursing note reviewed.  Constitutional:      Appearance: Normal appearance.  HENT:     Head: Normocephalic and atraumatic.     Right Ear: External ear normal.     Left Ear: External ear normal.     Nose: Nose normal.     Mouth/Throat:     Mouth: Mucous membranes are dry.  Eyes:     Extraocular Movements: Extraocular movements intact.     Conjunctiva/sclera: Conjunctivae normal.     Pupils: Pupils are equal, round, and reactive to light.  Cardiovascular:     Rate and Rhythm: Normal rate and regular rhythm.     Pulses: Normal pulses.     Heart sounds: Normal heart sounds.  Pulmonary:     Effort: Pulmonary effort is normal.     Breath sounds: Normal breath sounds.  Abdominal:     General: Abdomen is flat. Bowel sounds are normal.      Palpations: Abdomen is soft.  Musculoskeletal:        General: Normal range of motion.     Cervical back: Normal range of motion and neck supple.  Skin:    General: Skin is warm.     Capillary Refill: Capillary refill takes less than 2 seconds.  Neurological:     General: No focal deficit present.  Mental Status: He is alert and oriented to person, place, and time.  Psychiatric:        Mood and Affect: Mood normal.        Behavior: Behavior normal.     ED Results / Procedures / Treatments   Labs (all labs ordered are listed, but only abnormal results are displayed) Labs Reviewed  CBC WITH DIFFERENTIAL/PLATELET - Abnormal; Notable for the following components:      Result Value   RBC 6.36 (*)    MCH 25.9 (*)    All other components within normal limits  COMPREHENSIVE METABOLIC PANEL - Abnormal; Notable for the following components:   Potassium 2.6 (*)    Chloride 95 (*)    Glucose, Bld 252 (*)    Creatinine, Ser 1.39 (*)    Total Bilirubin 1.5 (*)    All other components within normal limits  URINALYSIS, ROUTINE W REFLEX MICROSCOPIC - Abnormal; Notable for the following components:   Glucose, UA >=500 (*)    Protein, ur 30 (*)    All other components within normal limits  MAGNESIUM - Abnormal; Notable for the following components:   Magnesium 1.6 (*)    All other components within normal limits  TSH - Abnormal; Notable for the following components:   TSH 6.046 (*)    All other components within normal limits  CBG MONITORING, ED - Abnormal; Notable for the following components:   Glucose-Capillary 248 (*)    All other components within normal limits  I-STAT CREATININE, ED - Abnormal; Notable for the following components:   Creatinine, Ser 1.40 (*)    All other components within normal limits  CBG MONITORING, ED - Abnormal; Notable for the following components:   Glucose-Capillary 251 (*)    All other components within normal limits  RESP PANEL BY RT-PCR (FLU A&B,  COVID) ARPGX2  I-STAT VENOUS BLOOD GAS, ED    EKG EKG Interpretation  Date/Time:  Thursday May 19 2022 11:20:28 EST Ventricular Rate:  69 PR Interval:  149 QRS Duration: 87 QT Interval:  386 QTC Calculation: 414 R Axis:   26 Text Interpretation: Sinus rhythm No significant change since last tracing Confirmed by Isla Pence [phone]) on 05/19/2022 12:14:03 PM  Radiology DG Chest Portable 1 View  Result Date: 05/19/2022 CLINICAL DATA:  Cough EXAM: PORTABLE CHEST 1 VIEW COMPARISON:  Chest radiograph dated 07/13/2017 FINDINGS: Slightly low lung volumes. No focal consolidations. No pleural effusion or pneumothorax. Enlarged cardiomediastinal silhouette is likely projectional. The visualized skeletal structures are unremarkable. IMPRESSION: Low lung volumes. No acute cardiopulmonary process. Electronically Signed   By: Darrin Nipper M.D.   On: 05/19/2022 10:33    Procedures Procedures    Medications Ordered in ED Medications  sodium chloride 0.9 % bolus 1,000 mL (0 mLs Intravenous Stopped 05/19/22 1131)  potassium chloride SA (KLOR-CON M) CR tablet 40 mEq (40 mEq Oral Given 05/19/22 1230)  potassium chloride 10 mEq in 100 mL IVPB (0 mEq Intravenous Stopped 05/19/22 1351)  magnesium sulfate IVPB 2 g 50 mL (0 g Intravenous Stopped 05/19/22 1351)    ED Course/ Medical Decision Making/ A&P                           Medical Decision Making Amount and/or Complexity of Data Reviewed Labs: ordered. Radiology: ordered.  Risk OTC drugs. Prescription drug management.   This patient presents to the ED for concern of hyperglycemia, this involves an extensive number of  treatment options, and is a complaint that carries with it a high risk of complications and morbidity.  The differential diagnosis includes dka, hhs, infection   Co morbidities that complicate the patient evaluation  M2, HTN,  AIDS (hx Kaposi's sarcoma), asthma, and hypothyroidism.   Additional history  obtained:  Additional history obtained from epic chart review   Lab Tests:  I Ordered, and personally interpreted labs.  The pertinent results include:  cmp with k low at 2.6; glucose elevated at 252; cr elevated at 1.39.  CO2 nl.  Ua + glucose and protein, cbc nl; covid/flu neg   Imaging Studies ordered:  I ordered imaging studies including cxr  I independently visualized and interpreted imaging which showed  IMPRESSION:  Low lung volumes. No acute cardiopulmonary process.   I agree with the radiologist interpretation   Cardiac Monitoring:  The patient was maintained on a cardiac monitor.  I personally viewed and interpreted the cardiac monitored which showed an underlying rhythm of: nsr   Medicines ordered and prescription drug management:  I ordered medication including IVFs  for dehydration  Reevaluation of the patient after these medicines showed that the patient improved I have reviewed the patients home medicines and have made adjustments as needed   Problem List / ED Course:  Hypokalemia and hypomag:  replaced URI:  neg covid/flu and cxr clear.  Pt has no fever and is not hypoxic.  He looks well. Despite a low T4 helper count, I don't think I need to treat him with abx for cap or pcp. Hyperglycemia:  pt is encouraged to eat a diabetic diet.  He is to continue to take his dm meds.  F/u with pcp.   Reevaluation:  After the interventions noted above, I reevaluated the patient and found that they have :improved   Social Determinants of Health:  Lives at home   Dispostion:  After consideration of the diagnostic results and the patients response to treatment, I feel that the patent would benefit from discharge with outpatient f/u.          Final Clinical Impression(s) / ED Diagnoses Final diagnoses:  Hypokalemia  Hypomagnesemia  Viral upper respiratory tract infection  Hyperglycemia due to diabetes mellitus ([place])  Dehydration    Rx / DC Orders ED  Discharge Orders          Ordered    guaiFENesin (ROBITUSSIN) 100 MG/5ML liquid  Every 4 hours PRN        05/19/22 1330              Isla Pence, MD 05/19/22 1454

## 2022-05-25 ENCOUNTER — Other Ambulatory Visit (HOSPITAL_COMMUNITY)
Admission: RE | Admit: 2022-05-25 | Discharge: 2022-05-25 | Disposition: A | Discharge status: Home / Self Care | Payer: BC Managed Care – PPO | Source: Ambulatory Visit | Attending: Internal Medicine | Admitting: Internal Medicine

## 2022-05-25 ENCOUNTER — Other Ambulatory Visit: Payer: Self-pay

## 2022-05-25 ENCOUNTER — Other Ambulatory Visit: Payer: BC Managed Care – PPO

## 2022-05-25 DIAGNOSIS — B20 Human immunodeficiency virus [HIV] disease: Secondary | ICD-10-CM | POA: Diagnosis not present

## 2022-05-25 DIAGNOSIS — Z113 Encounter for screening for infections with a predominantly sexual mode of transmission: Secondary | ICD-10-CM | POA: Diagnosis not present

## 2022-05-25 DIAGNOSIS — Z79899 Other long term (current) drug therapy: Secondary | ICD-10-CM

## 2022-05-26 LAB — URINE CYTOLOGY ANCILLARY ONLY
Chlamydia: NEGATIVE
Comment: NEGATIVE
Comment: NORMAL
Neisseria Gonorrhea: NEGATIVE

## 2022-05-26 LAB — T-HELPER CELL (CD4) - (RCID CLINIC ONLY)
CD4 % Helper T Cell: 20 % — ABNORMAL LOW (ref 33–65)
CD4 T Cell Abs: 266 /uL — ABNORMAL LOW (ref 400–1790)

## 2022-05-27 ENCOUNTER — Ambulatory Visit: Payer: BC Managed Care – PPO | Admitting: Podiatry

## 2022-05-28 LAB — CBC WITH DIFFERENTIAL/PLATELET
Absolute Monocytes: 300 cells/uL (ref 200–950)
Basophils Absolute: 31 cells/uL (ref 0–200)
Basophils Relative: 0.8 %
Eosinophils Absolute: 140 cells/uL (ref 15–500)
Eosinophils Relative: 3.6 %
HCT: 46.9 % (ref 38.5–50.0)
Hemoglobin: 15.5 g/dL (ref 13.2–17.1)
Lymphs Abs: 1548 cells/uL (ref 850–3900)
MCH: 26.5 pg — ABNORMAL LOW (ref 27.0–33.0)
MCHC: 33 g/dL (ref 32.0–36.0)
MCV: 80.2 fL (ref 80.0–100.0)
MPV: 13.3 fL — ABNORMAL HIGH (ref 7.5–12.5)
Monocytes Relative: 7.7 %
Neutro Abs: 1880 cells/uL (ref 1500–7800)
Neutrophils Relative %: 48.2 %
Platelets: 220 10*3/uL (ref 140–400)
RBC: 5.85 10*6/uL — ABNORMAL HIGH (ref 4.20–5.80)
RDW: 13.4 % (ref 11.0–15.0)
Total Lymphocyte: 39.7 %
WBC: 3.9 10*3/uL (ref 3.8–10.8)

## 2022-05-28 LAB — COMPLETE METABOLIC PANEL WITH GFR
AG Ratio: 1.7 (calc) (ref 1.0–2.5)
ALT: 23 U/L (ref 9–46)
AST: 17 U/L (ref 10–40)
Albumin: 4.5 g/dL (ref 3.6–5.1)
Alkaline phosphatase (APISO): 46 U/L (ref 36–130)
BUN: 10 mg/dL (ref 7–25)
CO2: 35 mmol/L — ABNORMAL HIGH (ref 20–32)
Calcium: 9.7 mg/dL (ref 8.6–10.3)
Chloride: 96 mmol/L — ABNORMAL LOW (ref 98–110)
Creat: 1.27 mg/dL (ref 0.60–1.29)
Globulin: 2.6 g/dL (calc) (ref 1.9–3.7)
Glucose, Bld: 280 mg/dL — ABNORMAL HIGH (ref 65–99)
Potassium: 3.2 mmol/L — ABNORMAL LOW (ref 3.5–5.3)
Sodium: 139 mmol/L (ref 135–146)
Total Bilirubin: 1.5 mg/dL — ABNORMAL HIGH (ref 0.2–1.2)
Total Protein: 7.1 g/dL (ref 6.1–8.1)
eGFR: 72 mL/min/{1.73_m2} (ref >=60)

## 2022-05-28 LAB — HIV-1 RNA QUANT-NO REFLEX-BLD
HIV 1 RNA Quant: NOT DETECTED Copies/mL
HIV-1 RNA Quant, Log: NOT DETECTED Log cps/mL

## 2022-05-28 LAB — LIPID PANEL
Cholesterol: 118 mg/dL (ref <200)
HDL: 41 mg/dL (ref >=40)
LDL Cholesterol (Calc): 57 mg/dL (calc)
Non-HDL Cholesterol (Calc): 77 mg/dL (calc) (ref <130)
Total CHOL/HDL Ratio: 2.9 (calc) (ref <5.0)
Triglycerides: 115 mg/dL (ref <150)

## 2022-05-28 LAB — RPR: RPR Ser Ql: NONREACTIVE

## 2022-05-30 ENCOUNTER — Encounter: Payer: Self-pay | Admitting: Primary Care

## 2022-05-30 ENCOUNTER — Other Ambulatory Visit: Payer: Self-pay

## 2022-05-30 ENCOUNTER — Ambulatory Visit: Payer: Medicaid Other | Admitting: Internal Medicine

## 2022-05-30 ENCOUNTER — Ambulatory Visit (INDEPENDENT_AMBULATORY_CARE_PROVIDER_SITE_OTHER): Payer: BC Managed Care – PPO | Admitting: Primary Care

## 2022-05-30 VITALS — BP 130/82 | HR 111 | Temp 97.2°F | Ht 73.5 in | Wt 280.0 lb

## 2022-05-30 DIAGNOSIS — E1165 Type 2 diabetes mellitus with hyperglycemia: Secondary | ICD-10-CM | POA: Diagnosis not present

## 2022-05-30 DIAGNOSIS — T7840XS Allergy, unspecified, sequela: Secondary | ICD-10-CM

## 2022-05-30 LAB — POCT GLYCOSYLATED HEMOGLOBIN (HGB A1C): Hemoglobin A1C: 9 % — AB (ref 4.0–5.6)

## 2022-05-30 MED ORDER — GLIPIZIDE ER 5 MG PO TB24: 5.0000 mg | ORAL_TABLET | Freq: Every day | ORAL | 0 refills | Status: DC

## 2022-05-30 NOTE — Assessment & Plan Note (Signed)
Visit to Urgent Care on 04/01/22. Reviewed office notes.  Agree to complete FMLA paperwork for missed work days on September 23-25.

## 2022-05-30 NOTE — Progress Notes (Signed)
Subjective:    Patient ID: Steven [name], male    DOB: 10/03/1980, 41 y.o.   MRN: [id number]  HPI  Steven [name] is a very pleasant 41 y.o. male with a significant medical history including hypertension, HIV, hypothyroidism, type 2 diabetes, CKD, syphilis, asthma, GERD, hyperlipidemia who presents today for ED follow-up.   He presented to [facility] ED on 05/18/2022 for hyperglycemia with weakness and a cough.  He had not been eating very well given his recent birthday and travel to [facility].  During his stay in the ED he was noted to have hypokalemia (2.6) and hypomagnesemia.  Glucose was 252 nonfasting.  His cough symptoms were suspected to be secondary to viral URI.  He was encouraged to work on his diet, continue diabetes medications, and to take guaifenesin as needed.  His potassium and magnesium was replenished.  He was discharged home later that day.  Since his ED visit he underwent lab work on 05/25/2022 which revealed potassium of 3.2 and glucose reading of 280 nonfasting.  He is currently managed on metformin XR 1000 mg twice daily, glipizide XL 2.5 mg daily.  His last A1c was completed in our office in June 2023 which revealed A1c of 5.5. He is managed on potassium 20 mEq and is compliant daily.   He is checking glucose readings twice daily and is getting readings of:  AM fasting: high 100's to mid 200's 2 hours after dinner: mid 200's.   He has been watching his diet closely. His glucose readings have come down overall. He denies visual changes, polyuria, polydipsia now. He did have those symptoms a few weeks ago. He missed work November 8-13 and is needing paperwork completed.   He is also needing paperwork completed for missing work on September 23-25 for an allergic reaction. He visited the urgent care on 04/01/22.    Review of Systems  Eyes:  Negative for visual disturbance.  Respiratory:  Negative for shortness of breath.   Cardiovascular:  Negative for chest  pain.  Endocrine: Negative for polydipsia and polyuria.         Past Medical History:  Diagnosis Date   Asthma    Cancer ([place])    Colitis 05/27/2011   Diabetes mellitus without complication (H[facility])    HIV positive ([place]) 03/23/09   Genotype Y181C   HTN (hypertension)    Kaposi's sarcoma    Syphilis 03/23/09-   1:2    Social History   Socioeconomic History   Marital status: Single    Spouse name: Not on file   Number of children: Not on file   Years of education: Not on file   Highest education level: Not on file  Occupational History   Not on file  Tobacco Use   Smoking status: Never   Smokeless tobacco: Never  Vaping Use   Vaping Use: Never used  Substance and Sexual Activity   Alcohol use: Yes    Alcohol/week: 0.0 standard drinks of alcohol    Comment: socially - less now   Drug use: No   Sexual activity: Not Currently    Partners: Male    Birth control/protection: Condom    Comment: pt. declined condoms  Other Topics Concern   Not on file  Social History Narrative   Single.    No children.   Works in Therapist, art   Enjoys DJ, traveling.    Social Determinants of Health   Financial Resource Strain: Not on file  Food Insecurity: Not  on file  Transportation Needs: Not on file  Physical Activity: Not on file  Stress: Not on file  Social Connections: Not on file  Intimate Partner Violence: Not on file    Past Surgical History:  Procedure Laterality Date   BRAIN SURGERY     IR GENERIC HISTORICAL  02/12/2016   IR REMOVAL TUN ACCESS W/ PORT W/O FL MOD SED 02/12/2016 [name], MD [facility]-INTERV RAD    Family History  Problem Relation Age of Onset   Pancreatic cancer Mother    Cancer Mother    Diabetes Paternal Grandmother    Thyroid disease Paternal Grandmother     Allergies  Allergen Reactions   Lisinopril Swelling    Swelling of lower lip while on lisinopril   Penicillins Hives and Swelling    REACTION: rash and swelling   Levaquin [Levofloxacin  In D5w] Rash   Tessalon [Benzonatate] Rash   Aspirin Nausea Only    Current Outpatient Medications on File Prior to Visit  Medication Sig Dispense Refill   albuterol (VENTOLIN HFA) 108 (90 Base) MCG/ACT inhaler INHALE 2 PUFFS BY MOUTH EVERY 4 TO 6 HOURS AS NEEDED FOR COUGH OR WHEEZING 42.5 g 0   amLODipine (NORVASC) 10 MG tablet Take 1 tablet (10 mg total) by mouth daily. for blood pressure. 90 tablet 1   Blood Glucose Monitoring Suppl (ONETOUCH VERIO) w/Device KIT USE AS DIRECTED TO TEST UP TO FOUR TIMES DAILY     ciclopirox (PENLAC) 8 % solution APPLY 1 COAT TO TOENAIL EVERY DAY FOR 48 WEEKS. REMOVE WEEKLY WITH POLISH REMOVER 6.6 mL 11   EPINEPHRINE 0.3 mg/0.3 mL IJ SOAJ injection INJECT 0.3 MG IN THE MUSCLE AS NEEDED FOR ANAPHYLAXIS 2 each 1   esomeprazole (NEXIUM) 40 MG capsule TAKE ONE CAPSULE BY MOUTH ONCE DAILY AS DIRECTED (Patient taking differently: Take 40 mg by mouth daily.) 30 capsule 0   fexofenadine (ALLEGRA) 180 MG tablet Take 1 tablet (180 mg total) by mouth daily. 15 tablet 0   fluticasone (FLONASE) 50 MCG/ACT nasal spray SHAKE LIQUID AND USE 2 SPRAYS IN EACH NOSTRIL DAILY 16 g 5   fluticasone (FLOVENT HFA) 110 MCG/ACT inhaler Inhale 2 puffs into the lungs 2 (two) times daily. 12 g 5   fluticasone-salmeterol (ADVAIR HFA) 230-21 MCG/ACT inhaler INHALE 2 PUFFS BY MOUTH TWICE DAILY 12 g 0   glucose blood (ONETOUCH ULTRA) test strip USE AS DIRECTED UP TO FOUR TIMES DAILY. 300 strip 3   glucose blood test strip Use as instructed to test up to 4 times a day DX E11.9 100 each 3   hydrochlorothiazide (HYDRODIURIL) 25 MG tablet Take 1 tablet (25 mg total) by mouth daily. for blood pressure. 90 tablet 0   ibuprofen (ADVIL) 800 MG tablet Take 800 mg by mouth 3 (three) times daily as needed.     LANCETS MICRO THIN 33G MISC USE TO TEST BLOOD SUGAR ONCE A DAY 100 each 2   levothyroxine (SYNTHROID) 150 MCG tablet Take 1 tablet (150 mcg total) by mouth daily. 90 tablet 1   metFORMIN  (GLUCOPHAGE-XR) 500 MG 24 hr tablet TAKE 2 TABLETS BY MOUTH IN THE MORNING AND IN THE EVENING FOR DIABETES 360 tablet 1   montelukast (SINGULAIR) 10 MG tablet TAKE 1 TABLET(10 MG) BY MOUTH DAILY 90 tablet 2   Multiple Vitamin (MULTIVITAMIN) tablet Take 1 tablet by mouth daily.     ONE TOUCH ULTRA TEST test strip TEST BLOOD GLUCOSE THREE TIMES DAILY AS DIRECTED 100 each 2  PAZEO 0.7 % SOLN Place 1 drop into both eyes daily. 1 Bottle 5   potassium chloride SA (KLOR-CON M) 20 MEQ tablet TAKE 1 TABLET(20 MEQ) BY MOUTH DAILY 90 tablet 1   rosuvastatin (CRESTOR) 5 MG tablet TAKE 1 TABLET BY MOUTH EVERY DAY FOR CHOLESTEROL 90 tablet 3   TRIUMEQ 600-50-300 MG tablet TAKE 1 TABLET BY MOUTH DAILY 90 tablet 1   guaiFENesin (ROBITUSSIN) 100 MG/5ML liquid Take 5 mLs by mouth every 4 (four) hours as needed for cough or to loosen phlegm. (Patient not taking: Reported on 05/30/2022) 120 mL 0   methylPREDNISolone (MEDROL DOSEPAK) 4 MG TBPK tablet Take as directed (Patient not taking: Reported on 05/30/2022) 1 each 0   predniSONE (DELTASONE) 20 MG tablet Take two tablets my mouth once daily for four days, then one tablet once daily for four days. (Patient not taking: Reported on 05/30/2022) 12 tablet 0   Current Facility-Administered Medications on File Prior to Visit  Medication Dose Route Frequency Provider Last Rate Last Admin   omalizumab Arvid Right) injection 300 mg  300 mg Subcutaneous Q14 Days Garnet Sierras, DO   300 mg at 03/30/22 0838    BP 130/82   Pulse (!) 111   Temp (!) 97.2 F (36.2 C) (Temporal)   Ht 6' 1.5" (1.867 m)   Wt 280 lb (127 kg)   SpO2 97%   BMI 36.44 kg/m  Objective:   Physical Exam Cardiovascular:     Rate and Rhythm: Normal rate and regular rhythm.  Pulmonary:     Effort: Pulmonary effort is normal.     Breath sounds: Normal breath sounds. No wheezing or rales.  Musculoskeletal:     Cervical back: Neck supple.  Skin:    General: Skin is warm and dry.  Neurological:      Mental Status: He is alert and oriented to person, place, and time.  Psychiatric:        Mood and Affect: Mood normal.           Assessment & Plan:   Problem List Items Addressed This Visit       Endocrine   Type 2 diabetes mellitus with hyperglycemia ([place]) - Primary    Uncontrolled with A1c today of 9.0. Potassium level improving.  Continue potassium chloride 20 mEq daily.  He will continue to work on his diet.  Continue metformin 1000 mg twice daily. Increase glipizide XL to 5 mg daily.  Follow-up in 2 weeks. ED notes and labs reviewed.  Agree to complete FMLA paperwork excusing him from 05/18/2022 through 05/23/2022. He will have paperwork faxed to our office.      Relevant Medications   glipiZIDE (GLIPIZIDE XL) 5 MG 24 hr tablet   Other Relevant Orders   POCT glycosylated hemoglobin (Hb A1C) (Completed)     Other   Allergic reaction    Visit to Urgent Care on 04/01/22. Reviewed office notes.  Agree to complete FMLA paperwork for missed work days on September 23-25.          Pleas [name], NP

## 2022-05-30 NOTE — Patient Instructions (Signed)
We increased your dose of glipizide XL to 5 mg daily. Continue metformin twice daily.  Continue to monitor your sugars and watch your diet.  We will see you back in a few weeks.  It was a pleasure to see you today!

## 2022-05-30 NOTE — Assessment & Plan Note (Addendum)
Uncontrolled with A1c today of 9.0. Potassium level improving.  Continue potassium chloride 20 mEq daily.  He will continue to work on his diet.  Continue metformin 1000 mg twice daily. Increase glipizide XL to 5 mg daily.  Follow-up in 2 weeks. ED notes and labs reviewed.  Agree to complete FMLA paperwork excusing him from 05/18/2022 through 05/23/2022. He will have paperwork faxed to our office.

## 2022-05-30 NOTE — Progress Notes (Deleted)
[facility]   CHIEF COMPLAINT    HIV follow up.    SUBJECTIVE:    Steven [name] is a 41 y.o. male with PMHx as below who presents to the clinic for HIV follow up.   Please see A&P for the details of today's visit and status of the patient's medical problems.   Patient's Medications  New Prescriptions   No medications on file  Previous Medications   ALBUTEROL (VENTOLIN HFA) 108 (90 BASE) MCG/ACT INHALER    INHALE 2 PUFFS BY MOUTH EVERY 4 TO 6 HOURS AS NEEDED FOR COUGH OR WHEEZING   AMLODIPINE (NORVASC) 10 MG TABLET    Take 1 tablet (10 mg total) by mouth daily. for blood pressure.   BLOOD GLUCOSE MONITORING SUPPL (ONETOUCH VERIO) W/DEVICE KIT    USE AS DIRECTED TO TEST UP TO FOUR TIMES DAILY   CICLOPIROX (PENLAC) 8 % SOLUTION    APPLY 1 COAT TO TOENAIL EVERY DAY FOR 48 WEEKS. REMOVE WEEKLY WITH POLISH REMOVER   EPINEPHRINE 0.3 MG/0.3 ML IJ SOAJ INJECTION    INJECT 0.3 MG IN THE MUSCLE AS NEEDED FOR ANAPHYLAXIS   ESOMEPRAZOLE (NEXIUM) 40 MG CAPSULE    TAKE ONE CAPSULE BY MOUTH ONCE DAILY AS DIRECTED   FEXOFENADINE (ALLEGRA) 180 MG TABLET    Take 1 tablet (180 mg total) by mouth daily.   FLUTICASONE (FLONASE) 50 MCG/ACT NASAL SPRAY    SHAKE LIQUID AND USE 2 SPRAYS IN EACH NOSTRIL DAILY   FLUTICASONE (FLOVENT HFA) 110 MCG/ACT INHALER    Inhale 2 puffs into the lungs 2 (two) times daily.   FLUTICASONE-SALMETEROL (ADVAIR HFA) 230-21 MCG/ACT INHALER    INHALE 2 PUFFS BY MOUTH TWICE DAILY   GLIPIZIDE (GLUCOTROL XL) 2.5 MG 24 HR TABLET    Take 1 tablet (2.5 mg total) by mouth daily with breakfast. for diabetes.   GLUCOSE BLOOD (ONETOUCH ULTRA) TEST STRIP    USE AS DIRECTED UP TO FOUR TIMES DAILY.   GLUCOSE BLOOD TEST STRIP    Use as instructed to test up to 4 times a day DX E11.9   GUAIFENESIN (ROBITUSSIN) 100 MG/5ML LIQUID    Take 5 mLs by mouth every 4 (four) hours as needed for cough or to loosen phlegm.   HYDROCHLOROTHIAZIDE (HYDRODIURIL) 25 MG  TABLET    Take 1 tablet (25 mg total) by mouth daily. for blood pressure.   IBUPROFEN (ADVIL) 800 MG TABLET    Take 800 mg by mouth 3 (three) times daily as needed.   LANCETS MICRO THIN 33G MISC    USE TO TEST BLOOD SUGAR ONCE A DAY   LEVOTHYROXINE (SYNTHROID) 150 MCG TABLET    Take 1 tablet (150 mcg total) by mouth daily.   METFORMIN (GLUCOPHAGE-XR) 500 MG 24 HR TABLET    TAKE 2 TABLETS BY MOUTH IN THE MORNING AND IN THE EVENING FOR DIABETES   METHYLPREDNISOLONE (MEDROL DOSEPAK) 4 MG TBPK TABLET    Take as directed   MONTELUKAST (SINGULAIR) 10 MG TABLET    TAKE 1 TABLET(10 MG) BY MOUTH DAILY   MULTIPLE VITAMIN (MULTIVITAMIN) TABLET    Take 1 tablet by mouth daily.   ONE TOUCH ULTRA TEST TEST STRIP    TEST BLOOD GLUCOSE THREE TIMES DAILY AS DIRECTED   PAZEO 0.7 % SOLN    Place 1 drop into both eyes daily.   POTASSIUM CHLORIDE SA (KLOR-CON M) 20 MEQ TABLET    TAKE 1 TABLET(20  MEQ) BY MOUTH DAILY   PREDNISONE (DELTASONE) 20 MG TABLET    Take two tablets my mouth once daily for four days, then one tablet once daily for four days.   ROSUVASTATIN (CRESTOR) 5 MG TABLET    TAKE 1 TABLET BY MOUTH EVERY DAY FOR CHOLESTEROL   TRIUMEQ 600-50-300 MG TABLET    TAKE 1 TABLET BY MOUTH DAILY  Modified Medications   No medications on file  Discontinued Medications   No medications on file      Past Medical History:  Diagnosis Date   Asthma    Cancer ([place])    Colitis 05/27/2011   Diabetes mellitus without complication (HCC)    HIV positive ([place]) 03/23/09   Genotype Y181C   HTN (hypertension)    Kaposi's sarcoma    Syphilis 03/23/09-   1:2    Social History   Tobacco Use   Smoking status: Never   Smokeless tobacco: Never  Vaping Use   Vaping Use: Never used  Substance Use Topics   Alcohol use: Yes    Alcohol/week: 0.0 standard drinks of alcohol    Comment: socially - less now   Drug use: No    Family History  Problem Relation Age of Onset   Pancreatic cancer Mother    Cancer Mother     Diabetes Paternal Grandmother    Thyroid disease Paternal Grandmother     Allergies  Allergen Reactions   Lisinopril Swelling    Swelling of lower lip while on lisinopril   Penicillins Hives and Swelling    REACTION: rash and swelling   Levaquin [Levofloxacin In D5w] Rash   Tessalon [Benzonatate] Rash   Aspirin Nausea Only    ROS   OBJECTIVE:    There were no vitals filed for this visit.   There is no height or weight on file to calculate BMI.  Physical Exam  Labs and Microbiology:    Latest Ref Rng & Units 05/25/2022    8:36 AM 05/18/2022    4:56 PM 05/18/2022    4:46 PM  CMP  Glucose 65 - 99 mg/dL 280   252   BUN 7 - 25 mg/dL 10   14   Creatinine 0.60 - 1.29 mg/dL 1.27  1.40  1.39   Sodium 135 - 146 mmol/L 139   137   Potassium 3.5 - 5.3 mmol/L 3.2   2.6   Chloride 98 - 110 mmol/L 96   95   CO2 20 - 32 mmol/L 35   30   Calcium 8.6 - 10.3 mg/dL 9.7   9.6   Total Protein 6.1 - 8.1 g/dL 7.1   7.4   Total Bilirubin 0.2 - 1.2 mg/dL 1.5   1.5   Alkaline Phos 38 - 126 U/L   49   AST 10 - 40 U/L 17   23   ALT 9 - 46 U/L 23   20       Latest Ref Rng & Units 05/25/2022    8:36 AM 05/18/2022    4:46 PM 08/06/2021    9:19 AM  CBC  WBC 3.8 - 10.8 Thousand/uL 3.9  6.5  4.1   Hemoglobin 13.2 - 17.1 g/dL 15.5  16.5  15.2   Hematocrit 38.5 - 50.0 % 46.9  51.5  48.5   Platelets 140 - 400 Thousand/uL 220  216  197.0      Lab Results  Component Value Date   HIV1RNAQUANT Not Detected 05/25/2022   HIV1RNAQUANT <20 (H)  08/19/2021   HIV1RNAQUANT <20 (H) 09/14/2020   CD4TABS 266 (L) 05/25/2022   CD4TABS 283 (L) 09/14/2020   CD4TABS 349 (L) 12/13/2019    RPR and STI: Lab Results  Component Value Date   LABRPR NON-REACTIVE 05/25/2022   LABRPR REACTIVE (A) 08/19/2021   LABRPR REACTIVE (A) 09/14/2020   LABRPR REACTIVE (A) 12/13/2019   LABRPR REACTIVE (A) 01/08/2019   RPRTITER 1:1 (H) 08/19/2021   RPRTITER 1:2 (H) 09/14/2020   RPRTITER 1:1 (H) 12/13/2019   RPRTITER 1:1  (H) 01/08/2019   RPRTITER 1:2 (H) 04/11/2018    STI Results GC CT  05/25/2022  8:52 AM Negative  Negative   09/14/2020 10:36 AM Negative  Negative   12/13/2019 12:00 AM Negative  Negative   01/08/2019 12:00 AM Negative  Negative   04/11/2018 12:00 AM Negative  Negative   11/16/2016 12:00 AM Negative  Negative     Hepatitis B: Lab Results  Component Value Date   HEPBSAB NEG 01/14/2014   HEPBSAG NEGATIVE 03/29/2012   HEPBCAB NEG 03/29/2012   Hepatitis C: No results found for: "HEPCAB", "HCVRNAPCRQN" Hepatitis A: Lab Results  Component Value Date   HAV NEG 03/29/2012   Lipids: Lab Results  Component Value Date   CHOL 118 05/25/2022   TRIG 115 05/25/2022   HDL 41 05/25/2022   CHOLHDL 2.9 05/25/2022   VLDL 28.4 08/06/2021   LDLCALC 57 05/25/2022    Imaging:    ASSESSMENT & PLAN:    No problem-specific Assessment & Plan notes found for this encounter.   No orders of the defined types were placed in this encounter.      *** Vaccines Influenza: give every year COVID: recommend vaccination if not already done Prevnar 20: Give x 1 if no prior pneumonia vaccine.    - If only 1 dose of either PPSV 23 OR PCV 13 ----> give PCV 20 if > 1 year since last vaccine  - If received both PPSV 23 AND PCV 13 ----> give PCV 20 if > 5 years since last vaccine.  If < 5 years then wait to give PCV 20 Pneumovax-23: (if CD4 >200) give twice every 5 years apart before age 78, then once at age 46.  Give >8 weeks from [place] Prevnar-13: (preferably when CD4 >200) give once, give >1 year from last Pneumovax-23 Hepatitis A: give Havrix 2 dose series at 0 and 6-12 months if non-immune Hepatitis B: give Heplisav 2 dose series at 0 and 4 weeks if non-immune.  Repeat serology 2 months after vaccine and revaccinate if needed MenACWY: 2 dose primary series 8 weeks apart, then 1 dose booster every 5 years HPV: Gardasil-9 at 0, 2, and 6 months for ages 9-26 should be vaccinated.  Ages 73-45  should be offered if appropriate Tdap: give every 10 years Shingles: give Shingrix 2 dose series at 0 and 2-6 months if >50 years on ART with CD4 cell count >200 Varicella: primary vaccination may be considered in VZV seronegative persons aged >8 years (if CD4 >200)  MMR: vaccine should be given if born in [birth year] or after and do not have immunity (if CD4 >200)  Screening DEXA Scan: if age >32 Quantiferon: check at initiation of care Hepatitis C: check at initiation of care.  Screen annually if risk factors HLA [id number]: check at initiation of care G6PD: check if starting therapy with oxidant drugs Lipids: check annually Urinalysis: check annually or every 6 months if on tenofovir Hgb A1c: check annually  ASCVD Risk  Score Consider high-intensity statin therapy if 10-year ASCVD risk score >7.5% The ASCVD Risk score (Arnett DK, et al., 2019) failed to calculate for the following reasons:   The valid total cholesterol range is 130 to 320 mg/dL   Steven [name] for Infectious Disease [facility] 05/30/2022, 4:54 AM  HIV: Here for routine follow up.  Previously saw Dr Johnnye Sima in February 2023.  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  He is doing well on current ART regimen of Triumeq and reports no issues with access or tolerance of medications.  Will continue for now and refills sent.  Viral load on 05/25/22 was undetectable and CD4 count 266.  Follow up in 6 months.   DM: Followed by PCP and endocrinology.  A1c in June was 5.5.  Vaccines: Offered COVID booster and flu vaccine today.  He ***.   STI/Screening: Recent STI screen with GC/CT was negative and RPR was non-reactive.

## 2022-06-01 ENCOUNTER — Ambulatory Visit: Payer: BC Managed Care – PPO | Admitting: Podiatry

## 2022-06-07 ENCOUNTER — Ambulatory Visit: Payer: Medicaid Other | Admitting: Internal Medicine

## 2022-06-07 NOTE — Progress Notes (Deleted)
[name]   CHIEF COMPLAINT    HIV follow up.    SUBJECTIVE:    Steven [name] is a 41 y.o. male with PMHx as below who presents to the clinic for HIV follow up.   Please see A&P for the details of today's visit and status of the patient's medical problems.   Patient's Medications  New Prescriptions   No medications on file  Previous Medications   ALBUTEROL (VENTOLIN HFA) 108 (90 BASE) MCG/ACT INHALER    INHALE 2 PUFFS BY MOUTH EVERY 4 TO 6 HOURS AS NEEDED FOR COUGH OR WHEEZING   AMLODIPINE (NORVASC) 10 MG TABLET    Take 1 tablet (10 mg total) by mouth daily. for blood pressure.   BLOOD GLUCOSE MONITORING SUPPL (ONETOUCH VERIO) W/DEVICE KIT    USE AS DIRECTED TO TEST UP TO FOUR TIMES DAILY   CICLOPIROX (PENLAC) 8 % SOLUTION    APPLY 1 COAT TO TOENAIL EVERY DAY FOR 48 WEEKS. REMOVE WEEKLY WITH POLISH REMOVER   EPINEPHRINE 0.3 MG/0.3 ML IJ SOAJ INJECTION    INJECT 0.3 MG IN THE MUSCLE AS NEEDED FOR ANAPHYLAXIS   ESOMEPRAZOLE (NEXIUM) 40 MG CAPSULE    TAKE ONE CAPSULE BY MOUTH ONCE DAILY AS DIRECTED   FEXOFENADINE (ALLEGRA) 180 MG TABLET    Take 1 tablet (180 mg total) by mouth daily.   FLUTICASONE (FLONASE) 50 MCG/ACT NASAL SPRAY    SHAKE LIQUID AND USE 2 SPRAYS IN EACH NOSTRIL DAILY   FLUTICASONE (FLOVENT HFA) 110 MCG/ACT INHALER    Inhale 2 puffs into the lungs 2 (two) times daily.   FLUTICASONE-SALMETEROL (ADVAIR HFA) 230-21 MCG/ACT INHALER    INHALE 2 PUFFS BY MOUTH TWICE DAILY   GLIPIZIDE (GLIPIZIDE XL) 5 MG 24 HR TABLET    Take 1 tablet (5 mg total) by mouth daily with breakfast. for diabetes.   GLUCOSE BLOOD (ONETOUCH ULTRA) TEST STRIP    USE AS DIRECTED UP TO FOUR TIMES DAILY.   GLUCOSE BLOOD TEST STRIP    Use as instructed to test up to 4 times a day DX E11.9   GUAIFENESIN (ROBITUSSIN) 100 MG/5ML LIQUID    Take 5 mLs by mouth every 4 (four) hours as needed for cough or to loosen phlegm.   HYDROCHLOROTHIAZIDE (HYDRODIURIL) 25 MG TABLET     Take 1 tablet (25 mg total) by mouth daily. for blood pressure.   IBUPROFEN (ADVIL) 800 MG TABLET    Take 800 mg by mouth 3 (three) times daily as needed.   LANCETS MICRO THIN 33G MISC    USE TO TEST BLOOD SUGAR ONCE A DAY   LEVOTHYROXINE (SYNTHROID) 150 MCG TABLET    Take 1 tablet (150 mcg total) by mouth daily.   METFORMIN (GLUCOPHAGE-XR) 500 MG 24 HR TABLET    TAKE 2 TABLETS BY MOUTH IN THE MORNING AND IN THE EVENING FOR DIABETES   METHYLPREDNISOLONE (MEDROL DOSEPAK) 4 MG TBPK TABLET    Take as directed   MONTELUKAST (SINGULAIR) 10 MG TABLET    TAKE 1 TABLET(10 MG) BY MOUTH DAILY   MULTIPLE VITAMIN (MULTIVITAMIN) TABLET    Take 1 tablet by mouth daily.   ONE TOUCH ULTRA TEST TEST STRIP    TEST BLOOD GLUCOSE THREE TIMES DAILY AS DIRECTED   PAZEO 0.7 % SOLN    Place 1 drop into both eyes daily.   POTASSIUM CHLORIDE SA (KLOR-CON M) 20 MEQ TABLET    TAKE 1 TABLET(20  MEQ) BY MOUTH DAILY   PREDNISONE (DELTASONE) 20 MG TABLET    Take two tablets my mouth once daily for four days, then one tablet once daily for four days.   ROSUVASTATIN (CRESTOR) 5 MG TABLET    TAKE 1 TABLET BY MOUTH EVERY DAY FOR CHOLESTEROL   TRIUMEQ 600-50-300 MG TABLET    TAKE 1 TABLET BY MOUTH DAILY  Modified Medications   No medications on file  Discontinued Medications   No medications on file      Past Medical History:  Diagnosis Date   Asthma    Cancer ([place])    Colitis 05/27/2011   Diabetes mellitus without complication (HCC)    HIV positive ([place]) 03/23/09   Genotype Y181C   HTN (hypertension)    Kaposi's sarcoma    Syphilis 03/23/09-   1:2    Social History   Tobacco Use   Smoking status: Never   Smokeless tobacco: Never  Vaping Use   Vaping Use: Never used  Substance Use Topics   Alcohol use: Yes    Alcohol/week: 0.0 standard drinks of alcohol    Comment: socially - less now   Drug use: No    Family History  Problem Relation Age of Onset   Pancreatic cancer Mother    Cancer Mother    Diabetes  Paternal Grandmother    Thyroid disease Paternal Grandmother     Allergies  Allergen Reactions   Lisinopril Swelling    Swelling of lower lip while on lisinopril   Penicillins Hives and Swelling    REACTION: rash and swelling   Levaquin [Levofloxacin In D5w] Rash   Tessalon [Benzonatate] Rash   Aspirin Nausea Only    ROS   OBJECTIVE:    There were no vitals filed for this visit.   There is no height or weight on file to calculate BMI.  Physical Exam  Labs and Microbiology:    Latest Ref Rng & Units 05/25/2022    8:36 AM 05/18/2022    4:56 PM 05/18/2022    4:46 PM  CMP  Glucose 65 - 99 mg/dL 280   252   BUN 7 - 25 mg/dL 10   14   Creatinine 0.60 - 1.29 mg/dL 1.27  1.40  1.39   Sodium 135 - 146 mmol/L 139   137   Potassium 3.5 - 5.3 mmol/L 3.2   2.6   Chloride 98 - 110 mmol/L 96   95   CO2 20 - 32 mmol/L 35   30   Calcium 8.6 - 10.3 mg/dL 9.7   9.6   Total Protein 6.1 - 8.1 g/dL 7.1   7.4   Total Bilirubin 0.2 - 1.2 mg/dL 1.5   1.5   Alkaline Phos 38 - 126 U/L   49   AST 10 - 40 U/L 17   23   ALT 9 - 46 U/L 23   20       Latest Ref Rng & Units 05/25/2022    8:36 AM 05/18/2022    4:46 PM 08/06/2021    9:19 AM  CBC  WBC 3.8 - 10.8 Thousand/uL 3.9  6.5  4.1   Hemoglobin 13.2 - 17.1 g/dL 15.5  16.5  15.2   Hematocrit 38.5 - 50.0 % 46.9  51.5  48.5   Platelets 140 - 400 Thousand/uL 220  216  197.0      Lab Results  Component Value Date   HIV1RNAQUANT Not Detected 05/25/2022   HIV1RNAQUANT <20 (H)  08/19/2021   HIV1RNAQUANT <20 (H) 09/14/2020   CD4TABS 266 (L) 05/25/2022   CD4TABS 283 (L) 09/14/2020   CD4TABS 349 (L) 12/13/2019    RPR and STI: Lab Results  Component Value Date   LABRPR NON-REACTIVE 05/25/2022   LABRPR REACTIVE (A) 08/19/2021   LABRPR REACTIVE (A) 09/14/2020   LABRPR REACTIVE (A) 12/13/2019   LABRPR REACTIVE (A) 01/08/2019   RPRTITER 1:1 (H) 08/19/2021   RPRTITER 1:2 (H) 09/14/2020   RPRTITER 1:1 (H) 12/13/2019   RPRTITER 1:1 (H)  01/08/2019   RPRTITER 1:2 (H) 04/11/2018    STI Results GC CT  05/25/2022  8:52 AM Negative  Negative   09/14/2020 10:36 AM Negative  Negative   12/13/2019 12:00 AM Negative  Negative   01/08/2019 12:00 AM Negative  Negative   04/11/2018 12:00 AM Negative  Negative   11/16/2016 12:00 AM Negative  Negative     Hepatitis B: Lab Results  Component Value Date   HEPBSAB NEG 01/14/2014   HEPBSAG NEGATIVE 03/29/2012   HEPBCAB NEG 03/29/2012   Hepatitis C: No results found for: "HEPCAB", "HCVRNAPCRQN" Hepatitis A: Lab Results  Component Value Date   HAV NEG 03/29/2012   Lipids: Lab Results  Component Value Date   CHOL 118 05/25/2022   TRIG 115 05/25/2022   HDL 41 05/25/2022   CHOLHDL 2.9 05/25/2022   VLDL 28.4 08/06/2021   LDLCALC 57 05/25/2022    Imaging:    ASSESSMENT & PLAN:    No problem-specific Assessment & Plan notes found for this encounter.   No orders of the defined types were placed in this encounter.      *** Vaccines Influenza: give every year COVID: recommend vaccination if not already done Prevnar 20: Give x 1 if no prior pneumonia vaccine.    - If only 1 dose of either PPSV 23 OR PCV 13 ----> give PCV 20 if > 1 year since last vaccine  - If received both PPSV 23 AND PCV 13 ----> give PCV 20 if > 5 years since last vaccine.  If < 5 years then wait to give PCV 20 Pneumovax-23: (if CD4 >200) give twice every 5 years apart before age 48, then once at age 15.  Give >8 weeks from [place] Prevnar-13: (preferably when CD4 >200) give once, give >1 year from last Pneumovax-23 Hepatitis A: give Havrix 2 dose series at 0 and 6-12 months if non-immune Hepatitis B: give Heplisav 2 dose series at 0 and 4 weeks if non-immune.  Repeat serology 2 months after vaccine and revaccinate if needed MenACWY: 2 dose primary series 8 weeks apart, then 1 dose booster every 5 years HPV: Gardasil-9 at 0, 2, and 6 months for ages 9-26 should be vaccinated.  Ages 43-45 should  be offered if appropriate Tdap: give every 10 years Shingles: give Shingrix 2 dose series at 0 and 2-6 months if >50 years on ART with CD4 cell count >200 Varicella: primary vaccination may be considered in VZV seronegative persons aged >8 years (if CD4 >200)  MMR: vaccine should be given if born in [birth year] or after and do not have immunity (if CD4 >200)  Screening DEXA Scan: if age >59 Quantiferon: check at initiation of care Hepatitis C: check at initiation of care.  Screen annually if risk factors HLA [id number]: check at initiation of care G6PD: check if starting therapy with oxidant drugs Lipids: check annually Urinalysis: check annually or every 6 months if on tenofovir Hgb A1c: check annually  ASCVD Risk  Score Consider high-intensity statin therapy if 10-year ASCVD risk score >7.5% The ASCVD Risk score (Arnett DK, et al., 2019) failed to calculate for the following reasons:   The valid total cholesterol range is 130 to 320 mg/dL   Rayn[facility] for Infectious Disease [facility] 06/07/2022, 5:35 AM  HIV: Here for routine follow up.  Previously saw Dr Johnnye Sima in February 2023.  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  He is doing well on current ART regimen of Triumeq and reports no issues with access or tolerance of medications.  Will continue for now and refills sent.  Viral load on 05/25/22 was undetectable and CD4 count 266.  Follow up in 6 months.   DM: Followed by PCP and endocrinology.  A1c in June was 5.5.  Vaccines: Offered COVID booster and flu vaccine today.  He ***.   STI/Screening: Recent STI screen with GC/CT was negative and RPR was non-reactive.

## 2022-06-09 ENCOUNTER — Telehealth: Payer: Self-pay | Admitting: Primary Care

## 2022-06-09 NOTE — Telephone Encounter (Signed)
Forms completed and placed in Kelli's inbox. Patient may pick up now or during his upcoming appointment.

## 2022-06-09 NOTE — Telephone Encounter (Signed)
In your inbox.

## 2022-06-09 NOTE — Telephone Encounter (Signed)
Patient came by and dropped off forms regarding his absence from work,to be completed by Anda Kraft. Forms was left in her folder up front. He said that he would pick up at his appointment on Dec 5.

## 2022-06-10 NOTE — Telephone Encounter (Signed)
Called and left a message letting him know that paperwork is ready to be picked up during this visit 06/14/22.

## 2022-06-13 ENCOUNTER — Ambulatory Visit (INDEPENDENT_AMBULATORY_CARE_PROVIDER_SITE_OTHER): Payer: BC Managed Care – PPO

## 2022-06-13 DIAGNOSIS — J4550 Severe persistent asthma, uncomplicated: Secondary | ICD-10-CM

## 2022-06-14 ENCOUNTER — Ambulatory Visit (INDEPENDENT_AMBULATORY_CARE_PROVIDER_SITE_OTHER): Payer: BC Managed Care – PPO | Admitting: Primary Care

## 2022-06-14 ENCOUNTER — Encounter: Payer: Self-pay | Admitting: Primary Care

## 2022-06-14 ENCOUNTER — Encounter (HOSPITAL_COMMUNITY): Payer: Self-pay

## 2022-06-14 ENCOUNTER — Ambulatory Visit (HOSPITAL_COMMUNITY)
Admission: EM | Admit: 2022-06-14 | Discharge: 2022-06-14 | Disposition: A | Discharge status: Home / Self Care | Payer: BC Managed Care – PPO | Attending: Family Medicine | Admitting: Family Medicine

## 2022-06-14 VITALS — BP 136/84 | HR 85 | Temp 97.7°F | Ht 73.5 in | Wt 280.0 lb

## 2022-06-14 DIAGNOSIS — R062 Wheezing: Secondary | ICD-10-CM

## 2022-06-14 DIAGNOSIS — T783XXA Angioneurotic edema, initial encounter: Secondary | ICD-10-CM | POA: Diagnosis not present

## 2022-06-14 DIAGNOSIS — J302 Other seasonal allergic rhinitis: Secondary | ICD-10-CM | POA: Diagnosis not present

## 2022-06-14 DIAGNOSIS — Z0000 Encounter for general adult medical examination without abnormal findings: Secondary | ICD-10-CM

## 2022-06-14 DIAGNOSIS — E890 Postprocedural hypothyroidism: Secondary | ICD-10-CM

## 2022-06-14 DIAGNOSIS — N1831 Chronic kidney disease, stage 3a: Secondary | ICD-10-CM

## 2022-06-14 DIAGNOSIS — K219 Gastro-esophageal reflux disease without esophagitis: Secondary | ICD-10-CM | POA: Diagnosis not present

## 2022-06-14 DIAGNOSIS — J4550 Severe persistent asthma, uncomplicated: Secondary | ICD-10-CM

## 2022-06-14 DIAGNOSIS — I10 Essential (primary) hypertension: Secondary | ICD-10-CM

## 2022-06-14 DIAGNOSIS — E876 Hypokalemia: Secondary | ICD-10-CM

## 2022-06-14 DIAGNOSIS — E785 Hyperlipidemia, unspecified: Secondary | ICD-10-CM

## 2022-06-14 DIAGNOSIS — B20 Human immunodeficiency virus [HIV] disease: Secondary | ICD-10-CM

## 2022-06-14 DIAGNOSIS — J3089 Other allergic rhinitis: Secondary | ICD-10-CM

## 2022-06-14 DIAGNOSIS — H1010 Acute atopic conjunctivitis, unspecified eye: Secondary | ICD-10-CM

## 2022-06-14 MED ORDER — ALBUTEROL SULFATE (2.5 MG/3ML) 0.083% IN NEBU
INHALATION_SOLUTION | RESPIRATORY_TRACT | Status: AC
  Filled 2022-06-14: qty 3

## 2022-06-14 MED ORDER — METHYLPREDNISOLONE SODIUM SUCC 125 MG IJ SOLR
80.0000 mg | Freq: Once | INTRAMUSCULAR | Status: AC
  Administered 2022-06-14: 80 mg via INTRAMUSCULAR

## 2022-06-14 MED ORDER — PREDNISONE 20 MG PO TABS: 40.0000 mg | ORAL_TABLET | Freq: Every day | ORAL | 0 refills | Status: AC

## 2022-06-14 MED ORDER — DIPHENHYDRAMINE HCL 25 MG PO CAPS: 50.0000 mg | ORAL_CAPSULE | Freq: Once | ORAL | Status: DC

## 2022-06-14 MED ORDER — ALBUTEROL SULFATE (2.5 MG/3ML) 0.083% IN NEBU
2.5000 mg | INHALATION_SOLUTION | Freq: Once | RESPIRATORY_TRACT | Status: AC
  Administered 2022-06-14: 2.5 mg via RESPIRATORY_TRACT

## 2022-06-14 MED ORDER — METHYLPREDNISOLONE SODIUM SUCC 125 MG IJ SOLR
INTRAMUSCULAR | Status: AC
  Filled 2022-06-14: qty 2

## 2022-06-14 MED ORDER — PREDNISONE 20 MG PO TABS: 40.0000 mg | ORAL_TABLET | Freq: Every day | ORAL | 0 refills | Status: DC

## 2022-06-14 NOTE — Assessment & Plan Note (Signed)
Reviewed renal function from November 2023.

## 2022-06-14 NOTE — Assessment & Plan Note (Signed)
Following with infectious disease.  Continue Triumeq 600-50-300 mg daily.

## 2022-06-14 NOTE — Discharge Instructions (Signed)
Been given 1 dose of methylprednisolone 80 mg  You were given 1 breathing treatment of albuterol  Take prednisone 20 mg--2 daily for 5 days  Take Benadryl 25 to 50 mg every 6 hours as needed for itching or allergic reaction.

## 2022-06-14 NOTE — Assessment & Plan Note (Signed)
Reviewed lipid panel from November 2023. Continue rosuvastatin 5 mg daily.

## 2022-06-14 NOTE — ED Triage Notes (Signed)
Chief Complaint: Patient having facial swelling, hives, eye redness, tongue swelling for the last hour. Patient states he is allergic to a lot of things but has not been exposed that he knows of. Patient states he had his Xolair shots yesterday for allergies.   Onset: 1 hour ago.   OTC medications tried: Yes- Benadryl 1 hour ago    with no relief

## 2022-06-14 NOTE — Assessment & Plan Note (Signed)
Controlled overall. Increase exercise and work on diet.  Continue amlodipine 10 mg daily.

## 2022-06-14 NOTE — Progress Notes (Signed)
Subjective:    Patient ID: Steven [name], male    DOB: [birth year], 41 y.o.   MRN: [id number]  HPI  Steven [name] is a very pleasant 41 y.o. male who presents today for complete physical and follow up of chronic conditions.  Immunizations: -Tetanus: 2015 -Influenza: Completed this season  -Pneumonia: 2022   Diet: [place].  Exercise: No regular exercise.  Eye exam: Completes annually  Dental exam: Completed several years ago  BP Readings from Last 3 Encounters:  06/14/22 136/84  05/30/22 130/82  05/19/22 (!) 138/90        Review of Systems  Constitutional:  Negative for unexpected weight change.  HENT:  Negative for rhinorrhea.   Respiratory:  Negative for cough and shortness of breath.   Cardiovascular:  Negative for chest pain.  Gastrointestinal:  Negative for constipation and diarrhea.  Genitourinary:  Negative for difficulty urinating.  Musculoskeletal:  Negative for arthralgias and myalgias.  Skin:  Negative for rash.  Allergic/Immunologic: Negative for environmental allergies.  Neurological:  Negative for dizziness, numbness and headaches.  Psychiatric/Behavioral:  The patient is not nervous/anxious.          Past Medical History:  Diagnosis Date   Asthma    Cancer ([place])    Colitis 05/27/2011   Diabetes mellitus without complication (HCC)    HIV positive ([place]) 03/23/09   Genotype Y181C   HTN (hypertension)    Kaposi's sarcoma    Syphilis 03/23/09-   1:2    Social History   Socioeconomic History   Marital status: Single    Spouse name: Not on file   Number of children: Not on file   Years of education: Not on file   Highest education level: Not on file  Occupational History   Not on file  Tobacco Use   Smoking status: Never   Smokeless tobacco: Never  Vaping Use   Vaping Use: Never used  Substance and Sexual Activity   Alcohol use: Yes    Alcohol/week: 0.0 standard drinks of alcohol    Comment: socially - less now   Drug use: No    Sexual activity: Not Currently    Partners: Male    Birth control/protection: Condom    Comment: pt. declined condoms  Other Topics Concern   Not on file  Social History Narrative   Single.    No children.   Works in Therapist, art   Enjoys DJ, traveling.    Social Determinants of Health   Financial Resource Strain: Not on file  Food Insecurity: Not on file  Transportation Needs: Not on file  Physical Activity: Not on file  Stress: Not on file  Social Connections: Not on file  Intimate Partner Violence: Not on file    Past Surgical History:  Procedure Laterality Date   BRAIN SURGERY     IR GENERIC HISTORICAL  02/12/2016   IR REMOVAL TUN ACCESS W/ PORT W/O FL MOD SED 02/12/2016 [name], MD [facility]    Family History  Problem Relation Age of Onset   Pancreatic cancer Mother    Cancer Mother    Diabetes Paternal Grandmother    Thyroid disease Paternal Grandmother     Allergies  Allergen Reactions   Lisinopril Swelling    Swelling of lower lip while on lisinopril   Penicillins Hives and Swelling    REACTION: rash and swelling   Levaquin [Levofloxacin In D5w] Rash   Tessalon [Benzonatate] Rash   Aspirin Nausea Only    Current  Outpatient Medications on File Prior to Visit  Medication Sig Dispense Refill   albuterol (VENTOLIN HFA) 108 (90 Base) MCG/ACT inhaler INHALE 2 PUFFS BY MOUTH EVERY 4 TO 6 HOURS AS NEEDED FOR COUGH OR WHEEZING 42.5 g 0   amLODipine (NORVASC) 10 MG tablet Take 1 tablet (10 mg total) by mouth daily. for blood pressure. 90 tablet 1   Blood Glucose Monitoring Suppl (ONETOUCH VERIO) w/Device KIT USE AS DIRECTED TO TEST UP TO FOUR TIMES DAILY     ciclopirox (PENLAC) 8 % solution APPLY 1 COAT TO TOENAIL EVERY DAY FOR 48 WEEKS. REMOVE WEEKLY WITH POLISH REMOVER 6.6 mL 11   EPINEPHRINE 0.3 mg/0.3 mL IJ SOAJ injection INJECT 0.3 MG IN THE MUSCLE AS NEEDED FOR ANAPHYLAXIS 2 each 1   esomeprazole (NEXIUM) 40 MG capsule TAKE ONE CAPSULE BY MOUTH  ONCE DAILY AS DIRECTED (Patient taking differently: Take 40 mg by mouth daily.) 30 capsule 0   fexofenadine (ALLEGRA) 180 MG tablet Take 1 tablet (180 mg total) by mouth daily. 15 tablet 0   fluticasone (FLONASE) 50 MCG/ACT nasal spray SHAKE LIQUID AND USE 2 SPRAYS IN EACH NOSTRIL DAILY 16 g 5   fluticasone (FLOVENT HFA) 110 MCG/ACT inhaler Inhale 2 puffs into the lungs 2 (two) times daily. 12 g 5   fluticasone-salmeterol (ADVAIR HFA) 230-21 MCG/ACT inhaler INHALE 2 PUFFS BY MOUTH TWICE DAILY 12 g 0   glipiZIDE (GLIPIZIDE XL) 5 MG 24 hr tablet Take 1 tablet (5 mg total) by mouth daily with breakfast. for diabetes. 90 tablet 0   glucose blood (ONETOUCH ULTRA) test strip USE AS DIRECTED UP TO FOUR TIMES DAILY. 300 strip 3   glucose blood test strip Use as instructed to test up to 4 times a day DX E11.9 100 each 3   hydrochlorothiazide (HYDRODIURIL) 25 MG tablet Take 1 tablet (25 mg total) by mouth daily. for blood pressure. 90 tablet 0   ibuprofen (ADVIL) 800 MG tablet Take 800 mg by mouth 3 (three) times daily as needed.     LANCETS MICRO THIN 33G MISC USE TO TEST BLOOD SUGAR ONCE A DAY 100 each 2   levothyroxine (SYNTHROID) 150 MCG tablet Take 1 tablet (150 mcg total) by mouth daily. 90 tablet 1   metFORMIN (GLUCOPHAGE-XR) 500 MG 24 hr tablet TAKE 2 TABLETS BY MOUTH IN THE MORNING AND IN THE EVENING FOR DIABETES 360 tablet 1   montelukast (SINGULAIR) 10 MG tablet TAKE 1 TABLET(10 MG) BY MOUTH DAILY 90 tablet 2   Multiple Vitamin (MULTIVITAMIN) tablet Take 1 tablet by mouth daily.     ONE TOUCH ULTRA TEST test strip TEST BLOOD GLUCOSE THREE TIMES DAILY AS DIRECTED 100 each 2   PAZEO 0.7 % SOLN Place 1 drop into both eyes daily. 1 Bottle 5   potassium chloride SA (KLOR-CON M) 20 MEQ tablet TAKE 1 TABLET(20 MEQ) BY MOUTH DAILY 90 tablet 1   rosuvastatin (CRESTOR) 5 MG tablet TAKE 1 TABLET BY MOUTH EVERY DAY FOR CHOLESTEROL 90 tablet 3   TRIUMEQ 600-50-300 MG tablet TAKE 1 TABLET BY MOUTH DAILY 90  tablet 1   guaiFENesin (ROBITUSSIN) 100 MG/5ML liquid Take 5 mLs by mouth every 4 (four) hours as needed for cough or to loosen phlegm. (Patient not taking: Reported on 06/14/2022) 120 mL 0   methylPREDNISolone (MEDROL DOSEPAK) 4 MG TBPK tablet Take as directed (Patient not taking: Reported on 05/30/2022) 1 each 0   predniSONE (DELTASONE) 20 MG tablet Take two tablets my mouth once  daily for four days, then one tablet once daily for four days. (Patient not taking: Reported on 05/30/2022) 12 tablet 0   Current Facility-Administered Medications on File Prior to Visit  Medication Dose Route Frequency Provider Last Rate Last Admin   omalizumab Arvid Right) injection 300 mg  300 mg Subcutaneous Q14 Days Rexene Alberts M, DO   300 mg at 06/13/22 1038    BP 136/84   Pulse 85   Temp 97.7 F (36.5 C) (Temporal)   Ht 6' 1.5" (1.867 m)   Wt 280 lb (127 kg)   SpO2 98%   BMI 36.44 kg/m  Objective:   Physical Exam HENT:     Right Ear: Tympanic membrane and ear canal normal.     Left Ear: Tympanic membrane and ear canal normal.     Nose: Nose normal.     Right Sinus: No maxillary sinus tenderness or frontal sinus tenderness.     Left Sinus: No maxillary sinus tenderness or frontal sinus tenderness.  Eyes:     Conjunctiva/sclera: Conjunctivae normal.  Neck:     Thyroid: No thyromegaly.     Vascular: No carotid bruit.  Cardiovascular:     Rate and Rhythm: Normal rate and regular rhythm.     Heart sounds: Normal heart sounds.  Pulmonary:     Effort: Pulmonary effort is normal.     Breath sounds: Normal breath sounds. No wheezing or rales.  Abdominal:     General: Bowel sounds are normal.     Palpations: Abdomen is soft.     Tenderness: There is no abdominal tenderness.  Musculoskeletal:        General: Normal range of motion.     Cervical back: Neck supple.  Skin:    General: Skin is warm and dry.  Neurological:     Mental Status: He is alert and oriented to person, place, and time.     Cranial  Nerves: No cranial nerve deficit.     Deep Tendon Reflexes: Reflexes are normal and symmetric.  Psychiatric:        Mood and Affect: Mood normal.           Assessment & Plan:   Problem List Items Addressed This Visit       Cardiovascular and Mediastinum   Essential hypertension    Controlled overall. Increase exercise and work on diet.  Continue amlodipine 10 mg daily.        Respiratory   Severe persistent asthma without complication - Primary    Following with pulmonology.  Continue Advair 230-21 mcg, 2 puffs BID, Xolair injections every 2 weeks, Singulair 10 mg HS, and albuterol inhaler PRN.      Seasonal and perennial allergic rhinoconjunctivitis    Controlled.  Continue Singulair 10 mg HS, Allegra PRN.        Digestive   Gastroesophageal reflux disease    Controlled.  Continue Nexium 40 mg daily.         Endocrine   Hypothyroidism, postradioiodine therapy    Reviewed TSH from November 2023 which is above goal. Discussed correct administration of levothyroxine.  Continue levothyroxine 150 mcg daily.   Repeat TSH in 2 months. Follows with endocrinology         Genitourinary   CKD (chronic kidney disease) stage 3, GFR 30-59 ml/min (HCC)    Reviewed renal function from November 2023.        Other   HIV infection ([place])    Following with infectious disease.  Continue  Triumeq 600-50-300 mg daily.      Preventative health care    Immunizations UTD.  Discussed the importance of a healthy diet and regular exercise in order for weight loss, and to reduce the risk of further co-morbidity.  Exam stable. Labs pending.  Follow up in 1 year for repeat physical.       Hyperlipidemia    Reviewed lipid panel from November 2023. Continue rosuvastatin 5 mg daily.      Hypokalemia    Discussed compliance to potassium  Continue potassium chloride 20 mEq daily. Repeat next visit.          Pleas [name], NP

## 2022-06-14 NOTE — Assessment & Plan Note (Addendum)
Controlled. ?Continue Nexium 40 mg daily. ?

## 2022-06-14 NOTE — ED Provider Notes (Signed)
[facility]    CSN: [id number] Arrival date & time: 06/14/22  1442      History   Chief Complaint Chief Complaint  Patient presents with   Allergic Reaction    HPI Steven [name] is a 41 y.o. male.    Allergic Reaction  For swelling and itching in his eyelids and face.  His tongue is also felt swollen some.  This began about an hour ago.  He is on Xolair for his allergies and asthma.  He is felt a little short of breath no fever or chills  Past Medical History:  Diagnosis Date   Asthma    Cancer ([place])    Colitis 05/27/2011   Diabetes mellitus without complication (HCC)    HIV positive ([place]) 03/23/09   Genotype Y181C   HTN (hypertension)    Kaposi's sarcoma    Syphilis 03/23/09-   1:2    Patient Active Problem List   Diagnosis Date Noted   Chronic shoulder pain 12/16/2021   Hypokalemia 08/06/2021   Preoperative clearance 10/29/2020   Hyperlipidemia 05/28/2020   Preventative health care 08/10/2018   Severe persistent asthma without complication 25/63/8937   Allergic reaction 06/15/2018   Food allergy 06/15/2018   Seasonal and perennial allergic rhinoconjunctivitis 06/15/2018   HIV infection ([place]) 02/09/2018   Gastroesophageal reflux disease 03/28/2017   Type 2 diabetes mellitus with hyperglycemia ([place]) 03/25/2015   CKD (chronic kidney disease) stage 3, GFR 30-59 ml/min ([place]) 03/23/2015   Thrush, oral 03/23/2015   Essential hypertension 11/24/2014   Hypothyroidism, postradioiodine therapy 01/13/2014   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 Authorizing Provider  albuterol (VENTOLIN HFA) 108 (90 Base) MCG/ACT inhaler INHALE 2 PUFFS BY MOUTH EVERY 4 TO 6 HOURS AS NEEDED FOR COUGH OR WHEEZING 02/19/21   Ambs, Kathrine Cords, FNP  amLODipine (NORVASC) 10 MG tablet Take 1 tablet (10 mg total) by mouth daily. for blood pressure. 11/12/21   Pleas [name], NP  Blood Glucose Monitoring Suppl (ONETOUCH VERIO) w/Device KIT USE AS DIRECTED TO TEST UP TO FOUR TIMES DAILY 02/19/21   [provider]  ciclopirox (PENLAC) 8 % solution APPLY 1 COAT TO TOENAIL EVERY DAY FOR 48 WEEKS. REMOVE WEEKLY WITH POLISH REMOVER 06/22/21   [place], Stephani Police, DPM  EPINEPHRINE 0.3 mg/0.3 mL IJ SOAJ injection INJECT 0.3 MG IN THE MUSCLE AS NEEDED FOR ANAPHYLAXIS 07/08/21   Ambs, Kathrine Cords, FNP  esomeprazole (NEXIUM) 40 MG capsule TAKE ONE CAPSULE BY MOUTH ONCE DAILY AS DIRECTED Patient taking differently: Take 40 mg by mouth daily. 02/06/17   Kozlow, Donnamarie Poag, MD  fexofenadine (ALLEGRA) 180 MG tablet Take 1 tablet (180 mg total) by mouth daily. 05/03/12   Harden Mo, MD  fluticasone (FLONASE) 50 MCG/ACT nasal spray SHAKE LIQUID AND USE 2 SPRAYS IN EACH NOSTRIL DAILY 09/17/21   Ambs, Kathrine Cords, FNP  fluticasone (FLOVENT HFA) 110 MCG/ACT inhaler Inhale 2 puffs into the lungs 2 (two) times daily. 02/03/21  Dara Hoyer, FNP  fluticasone-salmeterol (ADVAIR HFA) [phone] MCG/ACT inhaler INHALE 2 PUFFS BY MOUTH TWICE DAILY 02/03/21   Ambs, Kathrine Cords, FNP  glipiZIDE (GLIPIZIDE XL) 5 MG 24 hr tablet Take 1 tablet (5 mg total) by mouth daily with breakfast. for diabetes. 05/30/22   Pleas [name], NP  glucose blood (ONETOUCH ULTRA) test strip USE AS DIRECTED UP TO FOUR TIMES DAILY. 02/20/22   Pleas [name], NP  glucose blood test strip Use as instructed to test up to 4 times a day DX E11.9 01/22/21   Pleas [name], NP  guaiFENesin (ROBITUSSIN) 100 MG/5ML liquid Take 5 mLs by mouth every 4 (four) hours as needed for cough or to  loosen phlegm. Patient not taking: Reported on 06/14/2022 05/19/22   Isla Pence, MD  hydrochlorothiazide (HYDRODIURIL) 25 MG tablet Take 1 tablet (25 mg total) by mouth daily. for blood pressure. 04/12/22   Pleas [name], NP  ibuprofen (ADVIL) 800 MG tablet Take 800 mg by mouth 3 (three) times daily as needed. 01/20/21   [provider]  LANCETS MICRO THIN 33G MISC USE TO TEST BLOOD SUGAR ONCE A DAY 08/21/17   Tresa Garter, MD  levothyroxine (SYNTHROID) 150 MCG tablet Take 1 tablet (150 mcg total) by mouth daily. 03/18/22   Elayne Snare, MD  metFORMIN (GLUCOPHAGE-XR) 500 MG 24 hr tablet TAKE 2 TABLETS BY MOUTH IN THE MORNING AND IN THE EVENING FOR DIABETES 01/25/22   Pleas [name], NP  montelukast (SINGULAIR) 10 MG tablet TAKE 1 TABLET(10 MG) BY MOUTH DAILY 02/03/21   Ambs, Kathrine Cords, FNP  Multiple Vitamin (MULTIVITAMIN) tablet Take 1 tablet by mouth daily.    [provider]  ONE TOUCH ULTRA TEST test strip TEST BLOOD GLUCOSE THREE TIMES DAILY AS DIRECTED 10/12/18   Pleas [name], NP  PAZEO 0.7 % SOLN Place 1 drop into both eyes daily. 02/08/18   Kennith Gain, MD  potassium chloride SA (KLOR-CON M) 20 MEQ tablet TAKE 1 TABLET(20 MEQ) BY MOUTH DAILY 01/30/22   Pleas [name], NP  predniSONE (DELTASONE) 20 MG tablet Take 2 tablets (40 mg total) by mouth daily with breakfast for 5 days. 06/14/22 06/19/22  Barrett Henle, MD  rosuvastatin (CRESTOR) 5 MG tablet TAKE 1 TABLET BY MOUTH EVERY DAY FOR CHOLESTEROL 08/31/21   Pleas [name], NP  TRIUMEQ 600-50-300 MG tablet TAKE 1 TABLET BY MOUTH DAILY 01/25/22   Campbell Riches, MD    Family History Family History  Problem Relation Age of Onset   Pancreatic cancer Mother    Cancer Mother    Diabetes Paternal Grandmother    Thyroid disease Paternal Grandmother     Social History Social History   Tobacco Use   Smoking status: Never   Smokeless tobacco: Never  Vaping Use   Vaping Use: Never  used  Substance Use Topics   Alcohol use: Yes    Alcohol/week: 0.0 standard drinks of alcohol    Comment: socially - less now   Drug use: No     Allergies   Lisinopril, Penicillins, Levaquin [levofloxacin in d5w], Tessalon [benzonatate], and Aspirin   Review of Systems Review of Systems   Physical Exam Triage Vital Signs ED Triage Vitals [06/14/22 1445]  Enc Vitals Group     BP (!) 142/87     Pulse Rate 91     Resp 16     Temp 98.6 F (37 C)     Temp Source Oral  SpO2 94 %     Weight      Height      Head Circumference      Peak Flow      Pain Score 0     Pain Loc      Pain Edu?      Excl. in [place]?    No data found.  Updated Vital Signs BP (!) 142/87 (BP Location: Right Arm)   Pulse 91   Temp 98.6 F (37 C) (Oral)   Resp 16   SpO2 94%   Visual Acuity Right Eye Distance:   Left Eye Distance:   Bilateral Distance:    Right Eye Near:   Left Eye Near:    Bilateral Near:     Physical Exam Vitals reviewed.  Constitutional:      General: He is not in acute distress.    Appearance: He is not ill-appearing, toxic-appearing or diaphoretic.  HENT:     Mouth/Throat:     Mouth: Mucous membranes are moist.     Comments: 's are mildly swollen.  I cannot discern that the tongue is swollen. Eyes:     Extraocular Movements: Extraocular movements intact.     Conjunctiva/sclera: Conjunctivae normal.     Pupils: Pupils are equal, round, and reactive to light.     Comments: Eyelids are mildly swollen  Cardiovascular:     Rate and Rhythm: Normal rate and regular rhythm.     Heart sounds: No murmur heard. Pulmonary:     Effort: No respiratory distress.     Breath sounds: No stridor. No rhonchi or rales.     Comments: There are end expiratory wheezes bilaterally.  Air movement Skin:    Coloration: Skin is not jaundiced.  Neurological:     General: No focal deficit present.     Mental Status: He is alert and oriented to person, place, and time.  Psychiatric:         Behavior: Behavior normal.      UC Treatments / Results  Labs (all labs ordered are listed, but only abnormal results are displayed) Labs Reviewed - No data to display  EKG   Radiology No results found.  Procedures Procedures (including critical care time)  Medications Ordered in UC Medications  methylPREDNISolone sodium succinate (SOLU-MEDROL) 125 mg/2 mL injection 80 mg (80 mg Intramuscular Given 06/14/22 1456)  albuterol (PROVENTIL) (2.5 MG/3ML) 0.083% nebulizer solution 2.5 mg (2.5 mg Nebulization Given 06/14/22 1456)    Initial Impression / Assessment and Plan / UC Course  I have reviewed the triage vital signs and the nursing notes.  Pertinent labs & imaging results that were available during my care of the patient were reviewed by me and considered in my medical decision making (see chart for details).        After the nebulizer treatment and the Solu-Medrol he was much improved with some decrease in his swelling of his face and the itching was reduced a lot.  Also the shortness of breath felt better.  Benadryl makes him sleepy, so we decided not to give that since he was already improving here.  5-day course of prednisone is sent in and he can take Benadryl as needed at home Final Clinical Impressions(s) / UC Diagnoses   Final diagnoses:  Angioedema, initial encounter     Discharge Instructions      Been given 1 dose of methylprednisolone 80 mg  You were given 1 breathing treatment of albuterol  Take prednisone 20 mg--2  daily for 5 days  Take Benadryl 25 to 50 mg every 6 hours as needed for itching or allergic reaction.     ED Prescriptions     Medication Sig Dispense Auth. Provider   predniSONE (DELTASONE) 20 MG tablet  (Status: Discontinued) Take 2 tablets (40 mg total) by mouth daily with breakfast for 5 days. 10 tablet Barrett Henle, MD   predniSONE (DELTASONE) 20 MG tablet Take 2 tablets (40 mg total) by mouth daily with breakfast for  5 days. 10 tablet Windy Carina Gwenlyn Perking, MD      PDMP not reviewed this encounter.   Barrett Henle, MD 06/14/22 [phone]

## 2022-06-14 NOTE — Assessment & Plan Note (Signed)
Following with pulmonology.  Continue Advair 230-21 mcg, 2 puffs BID, Xolair injections every 2 weeks, Singulair 10 mg HS, and albuterol inhaler PRN.

## 2022-06-14 NOTE — Assessment & Plan Note (Signed)
Immunizations UTD.  Discussed the importance of a healthy diet and regular exercise in order for weight loss, and to reduce the risk of further co-morbidity.  Exam stable. Labs pending.  Follow up in 1 year for repeat physical.

## 2022-06-14 NOTE — Assessment & Plan Note (Addendum)
Reviewed TSH from November 2023 which is above goal. Discussed correct administration of levothyroxine.  Continue levothyroxine 150 mcg daily.   Repeat TSH in 2 months. Follows with endocrinology

## 2022-06-14 NOTE — Assessment & Plan Note (Signed)
Controlled.  Continue Singulair 10 mg HS, Allegra PRN.

## 2022-06-14 NOTE — Assessment & Plan Note (Signed)
Discussed compliance to potassium  Continue potassium chloride 20 mEq daily. Repeat next visit.

## 2022-06-20 ENCOUNTER — Telehealth: Payer: Self-pay | Admitting: *Deleted

## 2022-06-20 NOTE — Telephone Encounter (Signed)
L/m for patient regarding his Xolair and new Ins since restarting same. Will need copy of [employer] card to submit for approval and specialty pharmacy. Also will need MD appt

## 2022-06-28 ENCOUNTER — Ambulatory Visit: Payer: BC Managed Care – PPO

## 2022-07-03 ENCOUNTER — Other Ambulatory Visit: Payer: Self-pay | Admitting: Primary Care

## 2022-07-03 DIAGNOSIS — I10 Essential (primary) hypertension: Secondary | ICD-10-CM

## 2022-07-18 ENCOUNTER — Ambulatory Visit: Payer: BC Managed Care – PPO | Admitting: Family Medicine

## 2022-07-18 ENCOUNTER — Other Ambulatory Visit: Payer: Self-pay

## 2022-07-18 DIAGNOSIS — B20 Human immunodeficiency virus [HIV] disease: Secondary | ICD-10-CM

## 2022-07-18 MED ORDER — TRIUMEQ 600-50-300 MG PO TABS: 1.0000 | ORAL_TABLET | Freq: Every day | ORAL | 0 refills | Status: DC

## 2022-07-18 NOTE — Patient Instructions (Incomplete)
Asthma Continue montelukast 10 mg once a day to prevent cough or wheeze Continue Advair 230-2 puffs twice a day with a spacer to prevent cough or wheeze Continue albuterol 2 puffs once every 4 hours as needed for cough or wheeze You may use albuterol 2 puffs 5 to 15 minutes before activity to decrease cough or wheeze Continue Xolair injections 300 mg once every 2 weeks to help control your asthma  Allergic rhinitis Continue allergen avoidance measures directed toward grass pollen, weed pollen, mold, dust mite, cat, and dog as listed below Continue Allegra 180 mg once a day as needed for runny nose or itch Continue Flonase 2 sprays in each nostril once a day as needed for stuffy nose.  In the right nostril, point the applicator out toward the right ear. In the left nostril, point the applicator out toward the left ear Consider saline nasal rinses as needed for nasal symptoms. Use this before any medicated nasal sprays for best result  Allergic conjunctivitis Continue olopatadine 1 drop in each eye once a day as needed for red or itchy eyes  Reflux Continue dietary and lifestyle modifications as listed below Continue Nexium 40 mg once a day as needed for reflux  Food allergy Continue to avoid shellfish and mushroom. In case of an allergic reaction, take Benadryl 50 mg every 4 hours, and if life-threatening symptoms occur, inject with EpiPen 0.3 mg.  Call the clinic if this treatment plan is not working well for you.  Follow up in 6 months or sooner if needed.   Lifestyle Changes for Controlling GERD When you have GERD, stomach acid feels as if it's backing up toward your mouth. Whether or not you take medication to control your GERD, your symptoms can often be improved with lifestyle changes.   Raise Your Head Reflux is more likely to strike when you're lying down flat, because stomach fluid can flow backward more easily. Raising the head of your bed 4-6 inches can help. To do  this: Slide blocks or books under the legs at the head of your bed. Or, place a wedge under the mattress. Many foam stores can make a suitable wedge for you. The wedge should run from your waist to the top of your head. Don't just prop your head on several pillows. This increases pressure on your stomach. It can make GERD worse.  Watch Your Eating Habits Certain foods may increase the acid in your stomach or relax the lower esophageal sphincter, making GERD more likely. It's best to avoid the following: Coffee, tea, and carbonated drinks (with and without caffeine) Fatty, fried, or spicy food Mint, chocolate, onions, and tomatoes Any other foods that seem to irritate your stomach or cause you pain  Relieve the Pressure Eat smaller meals, even if you have to eat more often. Don't lie down right after you eat. Wait a few hours for your stomach to empty. Avoid tight belts and tight-fitting clothes. Lose excess weight.  Tobacco and Alcohol Avoid smoking tobacco and drinking alcohol. They can make GERD symptoms worse.  Reducing Pollen Exposure The American Academy of Allergy, Asthma and Immunology suggests the following steps to reduce your exposure to pollen during allergy seasons. Do not hang sheets or clothing out to dry; pollen may collect on these items. Do not mow lawns or spend time around freshly cut grass; mowing stirs up pollen. Keep windows closed at night.  Keep car windows closed while driving. Minimize morning activities outdoors, a time when pollen counts  are usually at their highest. Stay indoors as much as possible when pollen counts or humidity is high and on windy days when pollen tends to remain in the air longer. Use air conditioning when possible.  Many air conditioners have filters that trap the pollen spores. Use a HEPA room air filter to remove pollen form the indoor air you breathe.  Control of Mold Allergen Mold and fungi can grow on a variety of surfaces  provided certain temperature and moisture conditions exist.  Outdoor molds grow on plants, decaying vegetation and soil.  The major outdoor mold, Alternaria and Cladosporium, are found in very high numbers during hot and dry conditions.  Generally, a late Summer - Fall peak is seen for common outdoor fungal spores.  Rain will temporarily lower outdoor mold spore count, but counts rise rapidly when the rainy period ends.  The most important indoor molds are Aspergillus and Penicillium.  Dark, humid and poorly ventilated basements are ideal sites for mold growth.  The next most common sites of mold growth are the bathroom and the kitchen.  Outdoor [employer] Use air conditioning and keep windows closed Avoid exposure to decaying vegetation. Avoid leaf raking. Avoid grain handling. Consider wearing a face mask if working in moldy areas.  Indoor Mold Control Maintain humidity below 50%. Clean washable surfaces with 5% bleach solution. Remove sources e.g. Contaminated carpets.   Control of Dust Mite Allergen Dust mites play a major role in allergic asthma and rhinitis. They occur in environments with high humidity wherever human skin is found. Dust mites absorb humidity from the atmosphere (ie, they do not drink) and feed on organic matter (including shed human and animal skin). Dust mites are a microscopic type of insect that you cannot see with the naked eye. High levels of dust mites have been detected from mattresses, pillows, carpets, upholstered furniture, bed covers, clothes, soft toys and any woven material. The principal allergen of the dust mite is found in its feces. A gram of dust may contain 1,000 mites and 250,000 fecal particles. Mite antigen is easily measured in the air during house cleaning activities. Dust mites do not bite and do not cause harm to humans, other than by triggering allergies/asthma.  Ways to decrease your exposure to dust mites in your home:  1. Encase mattresses,  box springs and pillows with a mite-impermeable barrier or cover  2. Wash sheets, blankets and drapes weekly in hot water (130 F) with detergent and dry them in a dryer on the hot setting.  3. Have the room cleaned frequently with a vacuum cleaner and a damp dust-mop. For carpeting or rugs, vacuuming with a vacuum cleaner equipped with a high-efficiency particulate air (HEPA) filter. The dust mite allergic individual should not be in a room which is being cleaned and should wait 1 hour after cleaning before going into the room.  4. Do not sleep on upholstered furniture (eg, couches).  5. If possible removing carpeting, upholstered furniture and drapery from the home is ideal. Horizontal blinds should be eliminated in the rooms where the person spends the most time (bedroom, study, television room). Washable vinyl, roller-type shades are optimal.  6. Remove all non-washable stuffed toys from the bedroom. Wash stuffed toys weekly like sheets and blankets above.  7. Reduce indoor humidity to less than 50%. Inexpensive humidity monitors can be purchased at most hardware stores. Do not use a humidifier as can make the problem worse and are not recommended.  Control of Dog  or Cat Allergen Avoidance is the best way to manage a dog or cat allergy. If you have a dog or cat and are allergic to dog or cats, consider removing the dog or cat from the home. If you have a dog or cat but don't want to find it a new home, or if your family wants a pet even though someone in the household is allergic, here are some strategies that may help keep symptoms at bay:  Keep the pet out of your bedroom and restrict it to only a few rooms. Be advised that keeping the dog or cat in only one room will not limit the allergens to that room. Don't pet, hug or kiss the dog or cat; if you do, wash your hands with soap and water. High-efficiency particulate air (HEPA) cleaners run continuously in a bedroom or living room can  reduce allergen levels over time. Regular use of a high-efficiency vacuum cleaner or a central vacuum can reduce allergen levels. Giving your dog or cat a bath at least once a week can reduce airborne allergen.

## 2022-07-18 NOTE — Progress Notes (Deleted)
   [address] [place] [address] Dept: [phone]  FOLLOW UP NOTE  Patient ID: Steven [name], male    DOB: [birth year]  Age: 42 y.o. MRN: [id number] Date of Office Visit: 07/18/2022  Assessment  Chief Complaint: No chief complaint on file.  HPI Steven [name] is a 41 year old male who presents the clinic for follow-up visit.  He was last seen in this clinic on 02/03/2021 by Thermon Leyland, FNP, for evaluation of asthma, allergic rhinitis, allergic conjunctivitis, reflux, food allergy, and HIV.   Drug Allergies:  Allergies  Allergen Reactions   Lisinopril Swelling    Swelling of lower lip while on lisinopril   Penicillins Hives and Swelling    REACTION: rash and swelling   Levaquin [Levofloxacin In D5w] Rash   Tessalon [Benzonatate] Rash   Aspirin Nausea Only    Physical Exam: There were no vitals taken for this visit.   Physical Exam  Diagnostics:    Assessment and Plan: No diagnosis found.  No orders of the defined types were placed in this encounter.   There are no Patient Instructions on file for this visit.  No follow-ups on file.    Thank you for the opportunity to care for this patient.  Please do not hesitate to contact me with questions.  Thermon Leyland, FNP [facility]

## 2022-07-21 ENCOUNTER — Other Ambulatory Visit (HOSPITAL_COMMUNITY): Payer: Self-pay

## 2022-07-21 ENCOUNTER — Telehealth: Payer: Self-pay

## 2022-07-21 NOTE — Telephone Encounter (Signed)
[facility] Patient Advocate Encounter  Completed and sent [employer] Patient Assistance application for Triumeq for this patient who is uninsured.    Patient assistance phone number for follow up is [phone].   This encounter will be updated until final determination.   Ileene Patrick, [facility] Patient [facility] Phone: [phone] Fax:  [phone]

## 2022-07-21 NOTE — Telephone Encounter (Signed)
Patient called, unable to get his Triumeq. States he needs our office to reach out to [employer] to get him an emergency supply as he only has 2 days of medication left.   He still has insurance through work, but there is a Emergency planning/management officer in the system where his benefits were terminated. He is working with his employer to get this resolved.   Beryle Flock, RN

## 2022-07-25 ENCOUNTER — Encounter: Payer: Self-pay | Admitting: Oncology

## 2022-07-25 ENCOUNTER — Other Ambulatory Visit: Payer: Self-pay | Admitting: Primary Care

## 2022-07-25 DIAGNOSIS — E119 Type 2 diabetes mellitus without complications: Secondary | ICD-10-CM

## 2022-07-25 DIAGNOSIS — I10 Essential (primary) hypertension: Secondary | ICD-10-CM

## 2022-07-25 NOTE — Patient Instructions (Incomplete)
Asthma Continue montelukast 10 mg once a day to prevent cough or wheeze Continue Advair 230-2 puffs twice a day with a spacer to prevent cough or wheeze Continue albuterol 2 puffs once every 4 hours as needed for cough or wheeze You may use albuterol 2 puffs 5 to 15 minutes before activity to decrease cough or wheeze Continue Xolair injections 300 mg once every 2 weeks to help control your asthma   Allergic rhinitis Continue allergen avoidance measures directed toward grass pollen, weed pollen, mold, dust mite, cat, and dog as listed below Continue Allegra 180 mg once a day as needed for runny nose or itch Continue Flonase 2 sprays in each nostril once a day as needed for stuffy nose.  In the right nostril, point the applicator out toward the right ear. In the left nostril, point the applicator out toward the left ear Consider saline nasal rinses as needed for nasal symptoms. Use this before any medicated nasal sprays for best result   Allergic conjunctivitis Continue olopatadine 1 drop in each eye once a day as needed for red or itchy eyes   Reflux Continue dietary and lifestyle modifications as listed below Continue Nexium 40 mg once a day as needed for reflux   Food allergy Continue to avoid shellfish and mushroom. In case of an allergic reaction, take Benadryl 50 mg every 4 hours, and if life-threatening symptoms occur, inject with EpiPen 0.3 mg.   Call the clinic if this treatment plan is not working well for you.   Follow up in 6 months or sooner if needed.

## 2022-07-26 ENCOUNTER — Ambulatory Visit: Payer: BC Managed Care – PPO | Admitting: Family

## 2022-07-26 ENCOUNTER — Encounter: Payer: Self-pay | Admitting: Oncology

## 2022-07-26 ENCOUNTER — Other Ambulatory Visit (HOSPITAL_COMMUNITY): Payer: Self-pay

## 2022-07-28 ENCOUNTER — Encounter: Payer: Self-pay | Admitting: Internal Medicine

## 2022-07-28 ENCOUNTER — Ambulatory Visit (INDEPENDENT_AMBULATORY_CARE_PROVIDER_SITE_OTHER): Payer: BC Managed Care – PPO | Admitting: Internal Medicine

## 2022-07-28 ENCOUNTER — Other Ambulatory Visit: Payer: Self-pay

## 2022-07-28 VITALS — BP 139/85 | HR 84 | Temp 98.6°F | Ht 74.0 in | Wt 245.0 lb

## 2022-07-28 DIAGNOSIS — Z113 Encounter for screening for infections with a predominantly sexual mode of transmission: Secondary | ICD-10-CM | POA: Diagnosis not present

## 2022-07-28 DIAGNOSIS — J0110 Acute frontal sinusitis, unspecified: Secondary | ICD-10-CM | POA: Insufficient documentation

## 2022-07-28 DIAGNOSIS — B20 Human immunodeficiency virus [HIV] disease: Secondary | ICD-10-CM | POA: Diagnosis not present

## 2022-07-28 MED ORDER — TRIUMEQ 600-50-300 MG PO TABS: 1.0000 | ORAL_TABLET | Freq: Every day | ORAL | 11 refills | Status: DC

## 2022-07-28 MED ORDER — AZITHROMYCIN 500 MG PO TABS: 500.0000 mg | ORAL_TABLET | Freq: Every day | ORAL | 0 refills | Status: DC

## 2022-07-28 NOTE — Assessment & Plan Note (Addendum)
Screened negative No recent sexual activity.

## 2022-07-28 NOTE — Progress Notes (Signed)
   Subjective:    Patient ID: Steven [name], male    DOB: 02-Aug-1980, 42 y.o.   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  He has had some sinus issues and cold symptoms today associated with fever.  Feeling better now.   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   Review of Systems  Constitutional:  Negative for fatigue.  HENT:  Positive for sinus pressure, sinus pain and sore throat.   Gastrointestinal:  Negative for diarrhea.  Skin:  Negative for rash.       Objective:   Physical Exam Eyes:     General: No scleral icterus. Pulmonary:     Effort: Pulmonary effort is normal.  Skin:    Findings: No rash.  Neurological:     General: No focal deficit present.     Mental Status: He is alert.   SH: no tobacco        Assessment & Plan:

## 2022-07-28 NOTE — Assessment & Plan Note (Signed)
Some ongoing sinus drainage and recent fever so will give a course of azithromycin.

## 2022-07-28 NOTE — Assessment & Plan Note (Signed)
Doing well on Triumeq and no changes indicated.   Refills provided and will return in 9 months.

## 2022-07-29 ENCOUNTER — Ambulatory Visit (INDEPENDENT_AMBULATORY_CARE_PROVIDER_SITE_OTHER): Payer: BC Managed Care – PPO | Admitting: Podiatry

## 2022-07-29 ENCOUNTER — Other Ambulatory Visit: Payer: Self-pay | Admitting: Primary Care

## 2022-07-29 DIAGNOSIS — M79676 Pain in unspecified toe(s): Secondary | ICD-10-CM

## 2022-07-29 DIAGNOSIS — E1122 Type 2 diabetes mellitus with diabetic chronic kidney disease: Secondary | ICD-10-CM

## 2022-07-29 DIAGNOSIS — N182 Chronic kidney disease, stage 2 (mild): Secondary | ICD-10-CM

## 2022-07-29 DIAGNOSIS — B20 Human immunodeficiency virus [HIV] disease: Secondary | ICD-10-CM

## 2022-07-29 DIAGNOSIS — E785 Hyperlipidemia, unspecified: Secondary | ICD-10-CM

## 2022-07-29 DIAGNOSIS — L84 Corns and callosities: Secondary | ICD-10-CM

## 2022-07-29 DIAGNOSIS — B351 Tinea unguium: Secondary | ICD-10-CM | POA: Diagnosis not present

## 2022-07-29 DIAGNOSIS — E1165 Type 2 diabetes mellitus with hyperglycemia: Secondary | ICD-10-CM

## 2022-07-29 NOTE — Progress Notes (Signed)
  Subjective:  Patient ID: Steven [name], male    DOB: Jan 18, 1981,  MRN: [id number]  Steven [name] presents to clinic today for preventative diabetic foot care and callus(es) left lower extremity and painful thick toenails that are difficult to trim. Painful toenails interfere with ambulation. Aggravating factors include wearing enclosed shoe gear. Pain is relieved with periodic professional debridement. Painful calluses are aggravated when weightbearing with and without shoegear. Pain is relieved with periodic professional debridement.  Chief Complaint  Patient presents with   Nail Problem    [facility] BS-did not check today A1C-do not know PCP-Clark,Katherine PCP VST-06/14/2022   New problem(s): None.   PCP is Doreene Nest, NP.  Allergies  Allergen Reactions   Lisinopril Swelling    Swelling of lower lip while on lisinopril   Penicillins Hives and Swelling    REACTION: rash and swelling   Levaquin [Levofloxacin In D5w] Rash   Tessalon [Benzonatate] Rash   Aspirin Nausea Only    Review of Systems: Negative except as noted in the HPI.  Objective:  There were no vitals filed for this visit.  Steven [name] is a pleasant 43 y.o. male obese in NAD. AAO x 3.  Vascular:  Capillary refill time to digits immediate b/l. Palpable DP pulse(s) b/l lower extremities Palpable PT pulse(s) b/l lower extremities Pedal hair absent. Lower extremity skin temperature gradient within normal limits. No pain with calf compression b/l. Lymphedema present b/l lower extremities.  Dermatological:  Skin warm and supple b/l lower extremities. No open wounds b/l lower extremities. No interdigital macerations b/l lower extremities. Toenails 1-5 b/l elongated, discolored, dystrophic, thickened, crumbly with subungual debris and tenderness to dorsal palpation.   Hyperkeratotic lesion(s) submet head 5 left foot.  No erythema, no edema, no drainage, no fluctuance.  Musculoskeletal:  Normal muscle strength  5/5 to all lower extremity muscle groups bilaterally. No pain crepitus or joint limitation noted with ROM b/l lower extremities. No gross bony deformities b/l lower extremities.  Neurological:  Protective sensation intact 5/5 intact bilaterally with 10g monofilament b/l. Vibratory sensation intact b/l.  Assessment/Plan: 1. Pain due to onychomycosis of toenail   2. Callus   3. Currently asymptomatic HIV infection, with history of HIV-related illness (HCC)   4. Type 2 diabetes mellitus with stage 2 chronic kidney disease, without long-term current use of insulin (HCC)     -Patient was evaluated and treated. All patient's and/or POA's questions/concerns answered on today's visit. -Medicaid ABN signed for this year. Patient consents for services of paring of callus today. Copy has been placed in patient chart. -Continue foot and shoe inspections daily. Monitor blood glucose per PCP/Endocrinologist's recommendations. -Patient to continue soft, supportive shoe gear daily. -Toenails 1-5 b/l were debrided in length and girth with sterile nail nippers and dremel without iatrogenic bleeding.  -Callus(es) submet head 5 left foot pared utilizing sterile scalpel blade without complication or incident. Total number debrided =1. -Patient/POA to call should there be question/concern in the interim.   Return in about 9 weeks (around 09/30/2022).  Steven [name], DPM

## 2022-08-01 ENCOUNTER — Encounter: Payer: Self-pay | Admitting: Podiatry

## 2022-08-02 NOTE — Patient Instructions (Incomplete)
Asthma Continue montelukast 10 mg once a day to prevent cough or wheeze Continue Advair 230-2 puffs twice a day with a spacer to prevent cough or wheeze Continue albuterol 2 puffs once every 4 hours as needed for cough or wheeze You may use albuterol 2 puffs 5 to 15 minutes before activity to decrease cough or wheeze Continue Xolair injections 300 mg once every 2 weeks to help control your asthma   Allergic rhinitis Continue allergen avoidance measures directed toward grass pollen, weed pollen, mold, dust mite, cat, and dog as listed below Continue Allegra 180 mg once a day as needed for runny nose or itch Continue Flonase 2 sprays in each nostril once a day as needed for stuffy nose.  In the right nostril, point the applicator out toward the right ear. In the left nostril, point the applicator out toward the left ear Consider saline nasal rinses as needed for nasal symptoms. Use this before any medicated nasal sprays for best result   Allergic conjunctivitis Continue olopatadine 1 drop in each eye once a day as needed for red or itchy eyes   Reflux Continue dietary and lifestyle modifications as listed below Continue Nexium 40 mg once a day as needed for reflux   Food allergy Continue to avoid shellfish and mushroom. In case of an allergic reaction, take Benadryl 50 mg every 4 hours, and if life-threatening symptoms occur, inject with EpiPen 0.3 mg.  Allergic reaction If your symptoms re-occur, begin a journal of events that occurred for up to 6 hours before your symptoms began including foods and beverages consumed, soaps or perfumes you had contact with, and medications.  We will order some labs to look for causes of your reactions. We will call you with results once they are all back.  Your blood pressure was elevated while in the office today.  Please make an appointment with your primary care physician to discuss Call the clinic if this treatment plan is not working well for you.    Follow up in 6 weeks with Dr. Kim or sooner if needed.   

## 2022-08-03 ENCOUNTER — Other Ambulatory Visit: Payer: Self-pay

## 2022-08-03 ENCOUNTER — Encounter: Payer: Self-pay | Admitting: Family

## 2022-08-03 ENCOUNTER — Ambulatory Visit (INDEPENDENT_AMBULATORY_CARE_PROVIDER_SITE_OTHER): Payer: BC Managed Care – PPO | Admitting: Family

## 2022-08-03 VITALS — BP 130/90 | HR 95 | Temp 97.3°F | Resp 16 | Wt 283.5 lb

## 2022-08-03 DIAGNOSIS — B20 Human immunodeficiency virus [HIV] disease: Secondary | ICD-10-CM

## 2022-08-03 DIAGNOSIS — Z91018 Allergy to other foods: Secondary | ICD-10-CM

## 2022-08-03 DIAGNOSIS — T783XXD Angioneurotic edema, subsequent encounter: Secondary | ICD-10-CM

## 2022-08-03 DIAGNOSIS — J302 Other seasonal allergic rhinitis: Secondary | ICD-10-CM

## 2022-08-03 DIAGNOSIS — H1013 Acute atopic conjunctivitis, bilateral: Secondary | ICD-10-CM

## 2022-08-03 DIAGNOSIS — Z21 Asymptomatic human immunodeficiency virus [HIV] infection status: Secondary | ICD-10-CM

## 2022-08-03 DIAGNOSIS — J4550 Severe persistent asthma, uncomplicated: Secondary | ICD-10-CM

## 2022-08-03 DIAGNOSIS — T7840XD Allergy, unspecified, subsequent encounter: Secondary | ICD-10-CM | POA: Diagnosis not present

## 2022-08-03 DIAGNOSIS — K219 Gastro-esophageal reflux disease without esophagitis: Secondary | ICD-10-CM

## 2022-08-03 DIAGNOSIS — H1010 Acute atopic conjunctivitis, unspecified eye: Secondary | ICD-10-CM

## 2022-08-03 MED ORDER — FLUTICASONE PROPIONATE 50 MCG/ACT NA SUSP: NASAL | 5 refills | Status: DC

## 2022-08-03 MED ORDER — FLUTICASONE-SALMETEROL 230-21 MCG/ACT IN AERO: INHALATION_SPRAY | RESPIRATORY_TRACT | 5 refills | Status: DC

## 2022-08-03 MED ORDER — MONTELUKAST SODIUM 10 MG PO TABS: ORAL_TABLET | ORAL | 1 refills | Status: AC

## 2022-08-03 MED ORDER — FEXOFENADINE HCL 180 MG PO TABS: 180.0000 mg | ORAL_TABLET | Freq: Every day | ORAL | 0 refills | Status: AC

## 2022-08-03 MED ORDER — EPINEPHRINE 0.3 MG/0.3ML IJ SOAJ: INTRAMUSCULAR | 1 refills | Status: DC

## 2022-08-03 MED ORDER — ALBUTEROL SULFATE HFA 108 (90 BASE) MCG/ACT IN AERS: INHALATION_SPRAY | RESPIRATORY_TRACT | 1 refills | Status: DC

## 2022-08-03 NOTE — Progress Notes (Signed)
[place] [id number] Dept: [phone]  FOLLOW UP NOTE  Patient ID: Steven [name], male    DOB: February 22, 1981  Age: 42 y.o. MRN: [id number] Date of Office Visit: 08/03/2022  Assessment  Chief Complaint: Follow-up (Xolair renewal, medication refills)  HPI Steven [name] is a 42 year old male who presents today for follow-up of severe persistent asthma without complication, seasonal and perennial allergic rhinoconjunctivitis, food allergy, gastroesophageal reflux disease, and HIV infection.  He reports his HIV is undetectable right now.  He denies any new diagnosis or surgery since his last office visit.  Severe persistent asthma: He is currently taking Advair 230/21 mcg 2 puffs twice a day with spacer, montelukast 10 mg once a day, Xolair 300 mg injections every 2 weeks, and albuterol as needed.  His last Xolair injection was on June 13, 2022.  He reports that he has had some insurance issues, but has brought his new insurance cards today.  He  is currently getting over an upper respiratory infection.  He finished azithromycin yesterday.  He reports that he has had little bit of cough and wheezing with this upper respiratory infection.  Otherwise he denies tightness in chest, shortness of breath, and nocturnal awakenings due to breathing problems.  Since his last office visit he has not required any trips to the emergency room or urgent care due to his asthma.  He uses his albuterol once every 2 weeks.  He does feel like his Xolair injections help and he denies any problems or reactions with his Xolair injections.  Allergic rhinitis: He reports recent symptoms due to having an upper respiratory infection, but otherwise denies rhinorrhea, nasal congestion, and postnasal drip.  He continues to take Allegra 180 mg once a day and Flonase nasal spray as needed.  He has not had any sinus infections since we last saw him.  Allergic conjunctivitis: He reports itchy watery eyes only  during allergy season.  He does have olopatadine eyedrops and they help.  Reflux is reported as controlled with Nexium 40 mg once a day.  He denies any heartburn or reflux symptoms.  Food allergy: He continues to try to avoid shellfish and mushroom.  He does report that approximately over a year ago he ate a small amount of shrimp and did not have any problems.  He continues to avoid mushrooms.  His reaction with shellfish occurred a couple years ago and he reports that he developed swelling and hives.  He also reports the same reaction with mushrooms.  He has not had to use his EpiPen since we last saw him.  Discussed that we would not be able to do lab work to follow-up on his food allergies due to being on Xolair.  He reports approximately every 6 or 7 months he has an allergic reaction.  He will start feeling itching and swelling under his tongue.  Then his lips, sinus cavities, and eyes will start swelling.  This will last for a couple days.  Sometimes he will have a rash and sometimes he does not.  He denies any new medications or new products.  He also denies any new foods and is not able to correlate these reactions with food.  He has no family history of swelling, but does mention he has lymphedema and wears compression stockings.  He wonders if some of his medications are interacting even though he is not taking any new medications.  He also wonders if the environment is causing this, but mentions he  keeps his house clean.  He has not had to use his EpiPen with these reactions, but has gone to urgent care where he has received steroids and steroids by mouth.     Drug Allergies:  Allergies  Allergen Reactions   Lisinopril Swelling    Swelling of lower lip while on lisinopril   Penicillins Hives and Swelling    REACTION: rash and swelling   Levaquin [Levofloxacin In D5w] Rash   Tessalon [Benzonatate] Rash   Aspirin Nausea Only    Review of Systems: Review of Systems  Constitutional:   Negative for chills and fever.  HENT:         Reports allergy symptoms with recent upper respiratory infection, but other than that has not had rhinorrhea, nasal congestion, and postnasal drip  Eyes:        Reports itchy watery eyes during allergy season  Respiratory:  Positive for cough and wheezing. Negative for shortness of breath.        Reports a little bit of coughing and wheezing with the upper respiratory infection that he is now getting over.  Otherwise denies tightness in chest, shortness of breath, and nocturnal awakenings due to breathing problems  Cardiovascular:  Negative for chest pain and palpitations.  Gastrointestinal:        Denies heartburn or reflux symptoms on Nexium once a day  Genitourinary:  Negative for frequency.  Skin:        Reports he has been having allergic reactions approximately every 6 to 7 months where he will swelling and itching that occurs first under his tongue. Then his lips, sinus cavities, and eyes become involved.  The symptoms will last for a couple days.  At times he will have a rash with the itching and sometimes he will not have the rash.  Neurological:  Negative for headaches.  Endo/Heme/Allergies:  Positive for environmental allergies.     Physical Exam: BP (!) 130/90   Pulse 95   Temp (!) 97.3 F (36.3 C) (Temporal)   Resp 16   Wt 283 lb 8 oz (128.6 kg)   SpO2 96%   BMI 36.40 kg/m    Physical Exam Constitutional:      Appearance: Normal appearance.  HENT:     Head: Normocephalic and atraumatic.     Comments: Pharynx normal, eyes normal, ears normal, nose: Bilateral lower turbinates mildly edematous with no drainage noted    Right Ear: Tympanic membrane, ear canal and external ear normal.     Left Ear: Tympanic membrane, ear canal and external ear normal.     Mouth/Throat:     Mouth: Mucous membranes are moist.     Pharynx: Oropharynx is clear.  Eyes:     Conjunctiva/sclera: Conjunctivae normal.  Cardiovascular:     Rate and  Rhythm: Regular rhythm. Tachycardia present.     Heart sounds: Normal heart sounds.  Musculoskeletal:     Cervical back: Neck supple.  Skin:    General: Skin is warm.  Neurological:     Mental Status: He is alert and oriented to person, place, and time.  Psychiatric:        Mood and Affect: Mood normal.        Behavior: Behavior normal.        Thought Content: Thought content normal.        Judgment: Judgment normal.     Diagnostics: FVC 3.15 L (63%), FEV1 2.19 L (54.89%).  Spirometry indicates possible restrictive disease  Assessment and Plan:  1. Severe persistent asthma without complication   2. Angioedema, subsequent encounter   3. Allergic reaction, subsequent encounter   4. Seasonal and perennial allergic rhinoconjunctivitis   5. Food allergy   6. Gastroesophageal reflux disease, unspecified whether esophagitis present   7. Allergic conjunctivitis, unspecified laterality   8. HIV infection, unspecified symptom status ([place])     Meds ordered this encounter  Medications   albuterol (VENTOLIN HFA) 108 (90 Base) MCG/ACT inhaler    Sig: Inhale 2 puffs every 4-6 hours as as needed for cough, wheeze, tightness in chest, or shortness of breath    Dispense:  18 g    Refill:  1   EPINEPHrine 0.3 mg/0.3 mL IJ SOAJ injection    Sig: INJECT 0.3 MG IN THE MUSCLE AS NEEDED FOR ANAPHYLAXIS    Dispense:  2 each    Refill:  1   fexofenadine (ALLEGRA) 180 MG tablet    Sig: Take 1 tablet (180 mg total) by mouth daily.    Dispense:  15 tablet    Refill:  0   fluticasone (FLONASE) 50 MCG/ACT nasal spray    Sig: Use 1 to 2 sprays in each nostril once a day as needed for stuffy nose    Dispense:  16 g    Refill:  5    Patient needs an OV for further refills.   fluticasone-salmeterol (ADVAIR HFA) 230-21 MCG/ACT inhaler    Sig: INHALE 2 PUFFS BY MOUTH TWICE DAILY    Dispense:  12 g    Refill:  5   montelukast (SINGULAIR) 10 MG tablet    Sig: TAKE 1 TABLET(10 MG) BY MOUTH DAILY     Dispense:  90 tablet    Refill:  1    Patient Instructions  Asthma Continue montelukast 10 mg once a day to prevent cough or wheeze Continue Advair 230-2 puffs twice a day with a spacer to prevent cough or wheeze Continue albuterol 2 puffs once every 4 hours as needed for cough or wheeze You may use albuterol 2 puffs 5 to 15 minutes before activity to decrease cough or wheeze Continue Xolair injections 300 mg once every 2 weeks to help control your asthma   Allergic rhinitis Continue allergen avoidance measures directed toward grass pollen, weed pollen, mold, dust mite, cat, and dog as listed below Continue Allegra 180 mg once a day as needed for runny nose or itch Continue Flonase 2 sprays in each nostril once a day as needed for stuffy nose.  In the right nostril, point the applicator out toward the right ear. In the left nostril, point the applicator out toward the left ear Consider saline nasal rinses as needed for nasal symptoms. Use this before any medicated nasal sprays for best result   Allergic conjunctivitis Continue olopatadine 1 drop in each eye once a day as needed for red or itchy eyes   Reflux Continue dietary and lifestyle modifications as listed below Continue Nexium 40 mg once a day as needed for reflux   Food allergy Continue to avoid shellfish and mushroom. In case of an allergic reaction, take Benadryl 50 mg every 4 hours, and if life-threatening symptoms occur, inject with EpiPen 0.3 mg.  Allergic reaction If your symptoms re-occur, begin a journal of events that occurred for up to 6 hours before your symptoms began including foods and beverages consumed, soaps or perfumes you had contact with, and medications.  We will order some labs to look for causes of your reactions. We  will call you with results once they are all back.  Your blood pressure was elevated while in the office today.  Please make an appointment with your primary care physician to discuss Call  the clinic if this treatment plan is not working well for you.   Follow up in 6 weeks with Dr. Maudie Mercury or sooner if needed.    Return in about 6 weeks (around 09/14/2022), or if symptoms worsen or fail to improve.    Thank you for the opportunity to care for this patient.  Please do not hesitate to contact me with questions.  Althea Charon, FNP [facility]

## 2022-08-09 ENCOUNTER — Encounter: Payer: Self-pay | Admitting: Pharmacist

## 2022-08-09 LAB — C1 ESTERASE INHIBITOR, FUNCTIONAL: C1INH Functional/C1INH Total MFr SerPl: >93 %mean normal

## 2022-08-09 LAB — C1 ESTERASE INHIBITOR: C1INH SerPl-mCnc: 29 mg/dL (ref 21–39)

## 2022-08-09 LAB — C4 COMPLEMENT: Complement C4, Serum: 28 mg/dL (ref 12–38)

## 2022-08-09 LAB — TRYPTASE: Tryptase: 9.4 ug/L (ref 2.2–13.2)

## 2022-08-09 LAB — COMPLEMENT COMPONENT C1Q: Complement C1Q: 12.9 mg/dL (ref 10.2–20.3)

## 2022-08-10 NOTE — Progress Notes (Signed)
Please let Steven [name] know that we received his lab work back.  His lab work looking for causes of angioedema ( swelling) were normal.  Tryptase: is normal. This rules out mast cell disease. Make sure he keeps his follow up appointment on 09/14/22 @ 8:30 am with Dr. Maudie Mercury.

## 2022-08-12 ENCOUNTER — Encounter (HOSPITAL_COMMUNITY): Payer: Self-pay | Admitting: Emergency Medicine

## 2022-08-12 ENCOUNTER — Ambulatory Visit (HOSPITAL_COMMUNITY)
Admission: EM | Admit: 2022-08-12 | Discharge: 2022-08-12 | Disposition: A | Discharge status: Home / Self Care | Payer: BC Managed Care – PPO | Attending: Internal Medicine | Admitting: Internal Medicine

## 2022-08-12 DIAGNOSIS — T7840XA Allergy, unspecified, initial encounter: Secondary | ICD-10-CM | POA: Diagnosis not present

## 2022-08-12 MED ORDER — OLOPATADINE HCL 0.1 % OP SOLN: 1.0000 [drp] | Freq: Two times a day (BID) | OPHTHALMIC | 0 refills | Status: DC

## 2022-08-12 MED ORDER — FAMOTIDINE 20 MG PO TABS
20.0000 mg | ORAL_TABLET | Freq: Once | ORAL | Status: AC
  Administered 2022-08-12: 20 mg via ORAL

## 2022-08-12 MED ORDER — DEXAMETHASONE SODIUM PHOSPHATE 10 MG/ML IJ SOLN
INTRAMUSCULAR | Status: AC
  Filled 2022-08-12: qty 1

## 2022-08-12 MED ORDER — FAMOTIDINE 20 MG PO TABS: 20.0000 mg | ORAL_TABLET | Freq: Every day | ORAL | 0 refills | Status: DC

## 2022-08-12 MED ORDER — PREDNISONE 20 MG PO TABS: 20.0000 mg | ORAL_TABLET | Freq: Every day | ORAL | 0 refills | Status: AC

## 2022-08-12 MED ORDER — DEXAMETHASONE SODIUM PHOSPHATE 10 MG/ML IJ SOLN
10.0000 mg | Freq: Once | INTRAMUSCULAR | Status: AC
  Administered 2022-08-12: 10 mg via INTRAMUSCULAR

## 2022-08-12 MED ORDER — FAMOTIDINE 20 MG PO TABS
ORAL_TABLET | ORAL | Status: AC
  Filled 2022-08-12: qty 1

## 2022-08-12 NOTE — Discharge Instructions (Addendum)
You have been evaluated today for an allergic reaction. We gave you a steroid in the clinic to help with your symptoms.   You have also been given a prescription for prednisone steroid, please take them as directed starting tomorrow.  Continue taking Allegra once daily. Take famotidine (Pepcid) 20 mg once daily for further suppression of allergic reaction symptoms for the next 7 days. You may use olopatadine eyedrops as directed as needed for itching to the eyes.  Please schedule an appointment with your primary care provider for follow-up and ongoing management. Return if you experience rashes, difficulty breathing or swallowing, lip/mouth/tongue swelling, vomiting, or for any other concerning symptoms. If symptoms are severe, please go to the ER for further workup. I hope you feel better!

## 2022-08-12 NOTE — ED Triage Notes (Signed)
Reports at work for about 30 minutes then had to leave bc itching, felt swelling under tongue, congestion and eye irritation. Hasn't had any medications. Has epi pen but didn't use it

## 2022-08-12 NOTE — ED Provider Notes (Signed)
[facility]    CSN: [id number] Arrival date & time: 08/12/22  1655      History   Chief Complaint Chief Complaint  Patient presents with   Allergic Reaction    HPI Steven [name] is a 42 y.o. male.   Patient with history of food allergy and frequent allergic reactions presents to urgent care for evaluation of allergic reaction that started approximately 45 to 50 minutes ago.  Patient has an EpiPen in his possession but states that "usually if I get to the doctor soon enough, I do not have to use my EpiPen".  He states he went to work this afternoon but then had to leave early because he became very itchy to his palms, developed eye itching bilaterally, and swelling to the perioral area.  He does not have a rash and denies presence of hives.  Denies shortness of breath, cough, throat swelling, chest pain, and recent viral URI/fever/chills.  States he ate some enchiladas for lunch today but does not recall any other intake of foods outside of normal diet or exposures/intake of known dietary allergens/environmental allergens.  He recently saw his asthma specialist last month where he received a good checkup and was found to be stable.  He does have a history of diabetes, asthma, hypertension, Kaposi's sarcoma, HIV, and allergic rhinoconjunctivitis.  He takes Allegra daily for allergies.  He has not used his EpiPen before coming to urgent care and states that he has not had to use his EpiPen in "a long time".  He is unsure of what exactly triggered this allergic reaction and denies recent changes in bedding, laundry detergent, and personal hygiene products.  No recent medication changes, steroid, or antibiotic use.   Allergic Reaction   Past Medical History:  Diagnosis Date   Asthma    Cancer ([place])    Colitis 05/27/2011   Diabetes mellitus without complication (HCC)    HIV positive ([place]) 03/23/09   Genotype [id number]   HTN (hypertension)    Kaposi's sarcoma    Syphilis 03/23/09-    1:2    Patient Active Problem List   Diagnosis Date Noted   Routine screening for STI (sexually transmitted infection) 07/28/2022   Sinusitis, acute frontal 07/28/2022   Chronic shoulder pain 12/16/2021   Hypokalemia 08/06/2021   Preoperative clearance 10/29/2020   Hyperlipidemia 05/28/2020   Preventative health care 08/10/2018   Severe persistent asthma without complication 22/08/5425   Allergic reaction 06/15/2018   Food allergy 06/15/2018   Seasonal and perennial allergic rhinoconjunctivitis 06/15/2018   HIV infection ([place]) 02/09/2018   Gastroesophageal reflux disease 03/28/2017   Type 2 diabetes mellitus with hyperglycemia ([place]) 03/25/2015   CKD (chronic kidney disease) stage 3, GFR 30-59 ml/min ([place]) 03/23/2015   Thrush, oral 03/23/2015   Essential hypertension 11/24/2014   Hypothyroidism, 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 Authorizing Provider  famotidine (PEPCID) 20 MG tablet Take 1 tablet (20 mg total) by mouth daily for  14 days. 08/12/22 08/26/22 Yes Dossie Swor, Stasia Cavalier, FNP  olopatadine (PATANOL) 0.1 % ophthalmic solution Place 1 drop into both eyes 2 (two) times daily. 08/12/22  Yes Talbot Grumbling, FNP  predniSONE (DELTASONE) 20 MG tablet Take 1 tablet (20 mg total) by mouth daily for 5 days. 08/12/22 08/17/22 Yes StanhopeStasia Cavalier, FNP  abacavir-dolutegravir-lamiVUDine (TRIUMEQ) 600-50-300 MG tablet Take 1 tablet by mouth daily. 07/28/22   Thayer Headings, MD  albuterol  (VENTOLIN HFA) 108 (90 Base) MCG/ACT inhaler Inhale 2 puffs every 4-6 hours as as needed for cough, wheeze, tightness in chest, or shortness of breath 08/03/22   Althea Charon, FNP  amLODipine (NORVASC) 10 MG tablet TAKE 1 TABLET(10 MG) BY MOUTH DAILY FOR BLOOD PRESSURE 07/25/22   Pleas [name], NP  azithromycin (ZITHROMAX) 500 MG tablet Take 1 tablet (500 mg total) by mouth daily. Patient not taking: Reported on 08/03/2022 07/28/22   Thayer Headings, MD  Blood Glucose Monitoring Suppl (ONETOUCH VERIO) w/Device KIT USE AS DIRECTED TO TEST UP TO FOUR TIMES DAILY 02/19/21   [provider]  ciclopirox (PENLAC) 8 % solution APPLY 1 COAT TO TOENAIL EVERY DAY FOR 48 WEEKS. REMOVE WEEKLY WITH POLISH REMOVER 06/22/21   Marzetta Board, DPM  EPINEPHrine 0.3 mg/0.3 mL IJ SOAJ injection INJECT 0.3 MG IN THE MUSCLE AS NEEDED FOR ANAPHYLAXIS 08/03/22   Althea Charon, FNP  esomeprazole (NEXIUM) 40 MG capsule TAKE ONE CAPSULE BY MOUTH ONCE DAILY AS DIRECTED Patient taking differently: Take 40 mg by mouth daily. 02/06/17   Kozlow, Donnamarie Poag, MD  fexofenadine (ALLEGRA) 180 MG tablet Take 1 tablet (180 mg total) by mouth daily. 08/03/22   Althea Charon, FNP  fluticasone Asencion Islam) 50 MCG/ACT nasal spray Use 1 to 2 sprays in each nostril once a day as needed for stuffy nose 08/03/22   Althea Charon, FNP  fluticasone-salmeterol (ADVAIR HFA) [phone] MCG/ACT inhaler INHALE 2 PUFFS BY MOUTH TWICE DAILY 08/03/22   Althea Charon, FNP  glipiZIDE (GLUCOTROL XL) 5 MG 24 hr tablet TAKE 1 TABLET(5 MG) BY MOUTH DAILY WITH BREAKFAST FOR DIABETES 07/29/22   Pleas [name], NP  glucose blood (ONETOUCH ULTRA) test strip USE AS DIRECTED UP TO FOUR TIMES DAILY. 02/20/22   Pleas [name], NP  glucose blood test strip Use as instructed to test up to 4 times a day DX E11.9 01/22/21   Pleas [name], NP  guaiFENesin (ROBITUSSIN) 100 MG/5ML liquid Take 5 mLs by mouth every 4 (four) hours as needed for cough or to loosen  phlegm. 05/19/22   Isla Pence, MD  hydrochlorothiazide (HYDRODIURIL) 25 MG tablet TAKE 1 TABLET(25 MG) BY MOUTH DAILY FOR BLOOD PRESSURE 07/03/22   Pleas [name], NP  ibuprofen (ADVIL) 800 MG tablet Take 800 mg by mouth 3 (three) times daily as needed. 01/20/21   [provider]  LANCETS MICRO THIN 33G MISC USE TO TEST BLOOD SUGAR ONCE A DAY 08/21/17   Tresa Garter, MD  levothyroxine (SYNTHROID) 150 MCG tablet Take 1 tablet (150 mcg total) by mouth daily. 03/18/22   Elayne Snare, MD  metFORMIN (GLUCOPHAGE-XR) 500 MG 24 hr tablet TAKE 2 TABLETS BY MOUTH IN THE MORNING AND IN THE EVENING FOR DIABETES 07/25/22   Pleas [name], NP  montelukast (SINGULAIR) 10 MG tablet TAKE 1 TABLET(10 MG) BY MOUTH DAILY 08/03/22   Althea Charon, FNP  Multiple Vitamin (MULTIVITAMIN) tablet Take 1 tablet by mouth daily.    [provider]  ONE  TOUCH ULTRA TEST test strip TEST BLOOD GLUCOSE THREE TIMES DAILY AS DIRECTED 10/12/18   Pleas [name], NP  potassium chloride SA (KLOR-CON M) 20 MEQ tablet TAKE 1 TABLET(20 MEQ) BY MOUTH DAILY 07/25/22   Pleas [name], NP  rosuvastatin (CRESTOR) 5 MG tablet TAKE 1 TABLET BY MOUTH EVERY DAY FOR CHOLESTEROL 07/29/22   Pleas [name], NP    Family History Family History  Problem Relation Age of Onset   Pancreatic cancer Mother    Cancer Mother    Diabetes Paternal Grandmother    Thyroid disease Paternal Grandmother     Social History Social History   Tobacco Use   Smoking status: Never   Smokeless tobacco: Never  Vaping Use   Vaping Use: Never used  Substance Use Topics   Alcohol use: Yes    Alcohol/week: 0.0 standard drinks of alcohol    Comment: socially - less now   Drug use: No     Allergies   Lisinopril, Penicillins, Levaquin [levofloxacin in d5w], Tessalon [benzonatate], and Aspirin   Review of Systems Review of Systems Per HPI  Physical Exam Triage Vital Signs ED Triage Vitals  Enc Vitals Group     BP  08/12/22 1733 135/83     Pulse Rate 08/12/22 1733 83     Resp 08/12/22 1733 17     Temp 08/12/22 1733 98.6 F (37 C)     Temp Source 08/12/22 1733 Oral     SpO2 08/12/22 1733 96 %     Weight --      Height --      Head Circumference --      Peak Flow --      Pain Score 08/12/22 1730 0     Pain Loc --      Pain Edu? --      Excl. in [place]? --    No data found.  Updated Vital Signs BP 135/83 (BP Location: Left Arm)   Pulse 83   Temp 98.6 F (37 C) (Oral)   Resp 17   SpO2 96%   Visual Acuity Right Eye Distance:   Left Eye Distance:   Bilateral Distance:    Right Eye Near:   Left Eye Near:    Bilateral Near:     Physical Exam Vitals and nursing note reviewed.  Constitutional:      Appearance: He is diaphoretic. He is not ill-appearing or toxic-appearing.  HENT:     Head: Normocephalic and atraumatic.     Right Ear: Hearing, tympanic membrane, ear canal and external ear normal.     Left Ear: Hearing, tympanic membrane, ear canal and external ear normal.     Nose: Nose normal. No rhinorrhea.     Mouth/Throat:     Lips: Pink. No lesions.     Mouth: Mucous membranes are moist. No oral lesions or angioedema.     Dentition: Normal dentition.     Tongue: No lesions.     Pharynx: Pharyngeal swelling and posterior oropharyngeal erythema present. No oropharyngeal exudate or uvula swelling.     Tonsils: No tonsillar exudate or tonsillar abscesses. 0 on the right. 0 on the left.     Comments: Slight posterior oropharyngeal swelling with mild erythema to the posterior oropharynx.  No tonsillar swelling, muffled voice sounds, or drooling.  Patient maintaining secretions on his own without difficulty.  No appreciable angioedema, patient states his mouth feels swollen.  No tongue swelling or deviation.  Uvula is midline and is  not swollen. Eyes:     General: Lids are normal. Vision grossly intact. Gaze aligned appropriately.        Right eye: Discharge present.        Left eye:  Discharge present.    Extraocular Movements: Extraocular movements intact.     Conjunctiva/sclera:     Right eye: Right conjunctiva is injected.     Left eye: Left conjunctiva is injected.     Comments: Clear discharge present to both eyes.  Eyes appear injected.  No pain or dizziness elicited with EOMs.  EOMs intact.  Cardiovascular:     Rate and Rhythm: Normal rate and regular rhythm.     Heart sounds: Normal heart sounds, S1 normal and S2 normal.  Pulmonary:     Effort: Pulmonary effort is normal. No respiratory distress.     Breath sounds: Normal breath sounds and air entry. No decreased breath sounds, wheezing, rhonchi or rales.     Comments: Patient is moving air normally without adventitious sounds.  Oxygenating well at 96% on room air. Musculoskeletal:     Cervical back: Neck supple. No tenderness.  Lymphadenopathy:     Cervical: No cervical adenopathy.  Skin:    General: Skin is warm.     Capillary Refill: Capillary refill takes less than 2 seconds.     Findings: No rash.     Comments: No rash or hives.  Neurological:     General: No focal deficit present.     Mental Status: He is alert and oriented to person, place, and time. Mental status is at baseline.     Cranial Nerves: No dysarthria or facial asymmetry.  Psychiatric:        Mood and Affect: Mood normal.        Speech: Speech normal.        Behavior: Behavior normal.        Thought Content: Thought content normal.        Judgment: Judgment normal.      UC Treatments / Results  Labs (all labs ordered are listed, but only abnormal results are displayed) Labs Reviewed - No data to display  EKG   Radiology No results found.  Procedures Procedures (including critical care time)  Medications Ordered in UC Medications  dexamethasone (DECADRON) injection 10 mg (10 mg Intramuscular Given 08/12/22 1749)  famotidine (PEPCID) tablet 20 mg (20 mg Oral Given 08/12/22 1748)    Initial Impression / Assessment and  Plan / UC Course  I have reviewed the triage vital signs and the nursing notes.  Pertinent labs & imaging results that were available during my care of the patient were reviewed by me and considered in my medical decision making (see chart for details).  1.  Allergic reaction  Presentation is consistent with acute allergic reaction.  Patient appears diaphoretic but is without chest pain, shortness of breath, or sensation of throat closure.  Vital signs are hemodynamically stable and he is oxygenating well at 96% on room air  Patient given 10 mg Decadron and 20 mg famotidine on arrival to urgent care  Patient held in room and monitored for 10 to 15 minutes after injection given due to diaphoresis to evaluate for improvement in symptoms before discharge from urgent care.  Patient reports significant improvement in symptoms, therefore stable for discharge.  Prednisone 20 mg 5-day burst sent to pharmacy to be started tomorrow.  He is to start taking Pepcid 20 mg once daily for the next week starting tomorrow  as well.  He already takes Allegra antihistamine and is to continue this.  He has a follow-up appointment with his PCP this month and may discuss recent allergic reaction with them.  Discussed EpiPen precautions, he voices understanding.  Strict ER return precautions discussed.  Discussed physical exam and available lab work findings in clinic with patient.  Counseled patient regarding appropriate use of medications and potential side effects for all medications recommended or prescribed today. Discussed red flag signs and symptoms of worsening condition,when to call the PCP office, return to urgent care, and when to seek higher level of care in the emergency department. Patient verbalizes understanding and agreement with plan. All questions answered. Patient discharged in stable condition.    Final Clinical Impressions(s) / UC Diagnoses   Final diagnoses:  Allergic reaction, initial encounter      Discharge Instructions      You have been evaluated today for an allergic reaction. We gave you a steroid in the clinic to help with your symptoms.   You have also been given a prescription for prednisone steroid, please take them as directed starting tomorrow.  Continue taking Allegra once daily. Take famotidine (Pepcid) 20 mg once daily for further suppression of allergic reaction symptoms for the next 7 days. You may use olopatadine eyedrops as directed as needed for itching to the eyes.  Please schedule an appointment with your primary care provider for follow-up and ongoing management. Return if you experience rashes, difficulty breathing or swallowing, lip/mouth/tongue swelling, vomiting, or for any other concerning symptoms. If symptoms are severe, please go to the ER for further workup. I hope you feel better!       ED Prescriptions     Medication Sig Dispense Auth. Provider   olopatadine (PATANOL) 0.1 % ophthalmic solution Place 1 drop into both eyes 2 (two) times daily. 5 mL Joella Prince M, FNP   predniSONE (DELTASONE) 20 MG tablet Take 1 tablet (20 mg total) by mouth daily for 5 days. 5 tablet Talbot Grumbling, FNP   famotidine (PEPCID) 20 MG tablet Take 1 tablet (20 mg total) by mouth daily for 14 days. 30 tablet Talbot Grumbling, FNP      PDMP not reviewed this encounter.   Talbot Grumbling, [place] 08/12/22 1806

## 2022-08-16 ENCOUNTER — Ambulatory Visit: Payer: BC Managed Care – PPO | Admitting: Primary Care

## 2022-08-16 ENCOUNTER — Encounter (HOSPITAL_COMMUNITY): Payer: Self-pay

## 2022-08-16 ENCOUNTER — Emergency Department (HOSPITAL_COMMUNITY)
Admission: EM | Admit: 2022-08-16 | Discharge: 2022-08-17 | Disposition: A | Discharge status: Home / Self Care | Payer: BC Managed Care – PPO | Attending: Emergency Medicine | Admitting: Emergency Medicine

## 2022-08-16 ENCOUNTER — Other Ambulatory Visit: Payer: Self-pay

## 2022-08-16 ENCOUNTER — Telehealth: Payer: Self-pay

## 2022-08-16 DIAGNOSIS — E876 Hypokalemia: Secondary | ICD-10-CM

## 2022-08-16 DIAGNOSIS — R42 Dizziness and giddiness: Secondary | ICD-10-CM | POA: Diagnosis not present

## 2022-08-16 DIAGNOSIS — I10 Essential (primary) hypertension: Secondary | ICD-10-CM | POA: Diagnosis not present

## 2022-08-16 DIAGNOSIS — Z79899 Other long term (current) drug therapy: Secondary | ICD-10-CM | POA: Diagnosis not present

## 2022-08-16 DIAGNOSIS — E119 Type 2 diabetes mellitus without complications: Secondary | ICD-10-CM | POA: Diagnosis not present

## 2022-08-16 DIAGNOSIS — Z1152 Encounter for screening for COVID-19: Secondary | ICD-10-CM | POA: Insufficient documentation

## 2022-08-16 DIAGNOSIS — R202 Paresthesia of skin: Secondary | ICD-10-CM | POA: Insufficient documentation

## 2022-08-16 DIAGNOSIS — Z7984 Long term (current) use of oral hypoglycemic drugs: Secondary | ICD-10-CM | POA: Diagnosis not present

## 2022-08-16 LAB — COMPREHENSIVE METABOLIC PANEL
ALT: 18 U/L (ref 0–44)
AST: 23 U/L (ref 15–41)
Albumin: 4.3 g/dL (ref 3.5–5.0)
Alkaline Phosphatase: 42 U/L (ref 38–126)
Anion gap: 12 (ref 5–15)
BUN: 15 mg/dL (ref 6–20)
CO2: 31 mmol/L (ref 22–32)
Calcium: 9.2 mg/dL (ref 8.9–10.3)
Chloride: 96 mmol/L — ABNORMAL LOW (ref 98–111)
Creatinine, Ser: 1.45 mg/dL — ABNORMAL HIGH (ref 0.61–1.24)
GFR, Estimated: >60 mL/min (ref >60)
Glucose, Bld: 119 mg/dL — ABNORMAL HIGH (ref 70–99)
Potassium: 2.9 mmol/L — ABNORMAL LOW (ref 3.5–5.1)
Sodium: 139 mmol/L (ref 135–145)
Total Bilirubin: 1 mg/dL (ref 0.3–1.2)
Total Protein: 7.4 g/dL (ref 6.5–8.1)

## 2022-08-16 LAB — CBC WITH DIFFERENTIAL/PLATELET
Abs Immature Granulocytes: 0.01 10*3/uL (ref 0.00–0.07)
Basophils Absolute: 0 10*3/uL (ref 0.0–0.1)
Basophils Relative: 1 %
Eosinophils Absolute: 0.1 10*3/uL (ref 0.0–0.5)
Eosinophils Relative: 2 %
HCT: 47.1 % (ref 39.0–52.0)
Hemoglobin: 15.2 g/dL (ref 13.0–17.0)
Immature Granulocytes: 0 %
Lymphocytes Relative: 37 %
Lymphs Abs: 2.1 10*3/uL (ref 0.7–4.0)
MCH: 26.3 pg (ref 26.0–34.0)
MCHC: 32.3 g/dL (ref 30.0–36.0)
MCV: 81.5 fL (ref 80.0–100.0)
Monocytes Absolute: 0.4 10*3/uL (ref 0.1–1.0)
Monocytes Relative: 7 %
Neutro Abs: 3 10*3/uL (ref 1.7–7.7)
Neutrophils Relative %: 53 %
Platelets: 245 10*3/uL (ref 150–400)
RBC: 5.78 MIL/uL (ref 4.22–5.81)
RDW: 12.8 % (ref 11.5–15.5)
WBC: 5.7 10*3/uL (ref 4.0–10.5)
nRBC: 0 % (ref 0.0–0.2)

## 2022-08-16 LAB — RESP PANEL BY RT-PCR (RSV, FLU A&B, COVID)  RVPGX2
Influenza A by PCR: NEGATIVE
Influenza B by PCR: NEGATIVE
Resp Syncytial Virus by PCR: NEGATIVE
SARS Coronavirus 2 by RT PCR: NEGATIVE

## 2022-08-16 LAB — URINALYSIS, ROUTINE W REFLEX MICROSCOPIC
Bacteria, UA: NONE SEEN
Bilirubin Urine: NEGATIVE
Glucose, UA: NEGATIVE mg/dL
Hgb urine dipstick: NEGATIVE
Ketones, ur: 5 mg/dL — AB
Leukocytes,Ua: NEGATIVE
Nitrite: NEGATIVE
Protein, ur: 30 mg/dL — AB
Specific Gravity, Urine: 1.017 (ref 1.005–1.030)
pH: 5 (ref 5.0–8.0)

## 2022-08-16 LAB — CBG MONITORING, ED: Glucose-Capillary: 103 mg/dL — ABNORMAL HIGH (ref 70–99)

## 2022-08-16 LAB — TROPONIN I (HIGH SENSITIVITY)
Troponin I (High Sensitivity): 6 ng/L (ref <18)
Troponin I (High Sensitivity): 2 ng/L (ref <18)

## 2022-08-16 NOTE — ED Provider Triage Note (Signed)
Emergency Medicine Provider Triage Evaluation Note  Steven [name] , a 42 y.o. male  was evaluated in triage.  Pt complains of headache with some blurry vision. Also complaining of tingling down his bilateral arms for the past 3 days. Denies any trouble walking or talking. Reports he has been on prednisone for the past few days from an allergic reaciton and doesn't know if that is related. He has felt more fatigued and lightheaded. No chest pain or SOB.  Review of Systems  Positive:  Negative:   Physical Exam  BP (!) 162/96   Pulse 93   Temp 98.9 F (37.2 C)   Resp 18   Ht 6\' 2"  (1.88 m)   Wt 128.6 kg   SpO2 98%   BMI 36.40 kg/m  Gen:   Awake, no distress   Resp:  Normal effort  MSK:   Moves extremities without difficulty  Other:  Cranial nerves grossly intact. No pronator drift. Sensation intact.   Medical Decision Making  Medically screening exam initiated at 5:36 PM.  Appropriate orders placed.  Steven [name] was informed that the remainder of the evaluation will be completed by another provider, this initial triage assessment does not replace that evaluation, and the importance of remaining in the ED until their evaluation is complete.  Labs ordered. Will allow further workup to be determined by the providers in the main.    Sherrell Puller, PA-C 08/16/22 1739

## 2022-08-16 NOTE — ED Triage Notes (Signed)
Pt c/o dizziness and fatiguex3d. tingling in posterior of neck and both arms, blurred visionx1d

## 2022-08-16 NOTE — Telephone Encounter (Signed)
[facility] Night - Client Nonclinical Telephone Record  [facility] Client [facility] Night - Client Client Site [place] - Night Provider Alma Friendly - NP Contact Type Call Who Is Calling Patient / Member / Family / Caregiver Caller Name [place] Phone Number [phone] Patient Name Taelor Moncada Patient DOB [birth year] Call Type Message Only Information Provided Reason for Call Request to Reschedule Office Appointment Initial Comment Caller states they want to reschedule their appointment. Patient request to speak to RN No Disp. Time Disposition Final User 08/15/2022 5:16:44 PM General Information Provided Yes [place], [place] Call Closed By: Gracy Bruins Transaction Date/Time: 08/15/2022 5:13:15 PM (ET   Sending to [facility] support.

## 2022-08-16 NOTE — Telephone Encounter (Signed)
LVM for patient to call back and reschedule.

## 2022-08-17 ENCOUNTER — Telehealth: Payer: Self-pay

## 2022-08-17 DIAGNOSIS — R42 Dizziness and giddiness: Secondary | ICD-10-CM | POA: Diagnosis not present

## 2022-08-17 MED ORDER — POTASSIUM CHLORIDE CRYS ER 20 MEQ PO TBCR
80.0000 meq | EXTENDED_RELEASE_TABLET | Freq: Once | ORAL | Status: AC
  Administered 2022-08-17: 80 meq via ORAL
  Filled 2022-08-17: qty 4

## 2022-08-17 NOTE — Telephone Encounter (Signed)
Transition Care Management Unsuccessful Follow-up Telephone Call  Date of discharge and from where:  08/16/2022 At [facility] ED   Attempts:  1st Attempt  Reason for unsuccessful TCM follow-up call:  Left voice message

## 2022-08-17 NOTE — ED Provider Notes (Signed)
[facility] Provider Note   CSN: [id number] Arrival date & time: 08/16/22  1639     History  Chief Complaint  Patient presents with   Dizziness    Steven [name] is a 42 y.o. male.  42 year old male who presents ER today with lightheadedness and paresthesias.  Been on prednisone for a few days and has diabetes and hypertension wonders if 1 of these things could be related.  States is on the prednisone for an allergic rash. Takes K at home. No recent illnesses. No trauma. No fevers. No pain otherwise.    Dizziness      Home Medications Prior to Admission medications   Medication Sig Start Date End Date Taking? Authorizing Provider  abacavir-dolutegravir-lamiVUDine (TRIUMEQ) 600-50-300 MG tablet Take 1 tablet by mouth daily. 07/28/22   Thayer Headings, MD  albuterol (VENTOLIN HFA) 108 (90 Base) MCG/ACT inhaler Inhale 2 puffs every 4-6 hours as as needed for cough, wheeze, tightness in chest, or shortness of breath 08/03/22   Althea Charon, FNP  amLODipine (NORVASC) 10 MG tablet TAKE 1 TABLET(10 MG) BY MOUTH DAILY FOR BLOOD PRESSURE 07/25/22   Pleas [name], NP  azithromycin (ZITHROMAX) 500 MG tablet Take 1 tablet (500 mg total) by mouth daily. Patient not taking: Reported on 08/03/2022 07/28/22   Thayer Headings, MD  Blood Glucose Monitoring Suppl (ONETOUCH VERIO) w/Device KIT USE AS DIRECTED TO TEST UP TO FOUR TIMES DAILY 02/19/21   [provider]  ciclopirox (PENLAC) 8 % solution APPLY 1 COAT TO TOENAIL EVERY DAY FOR 48 WEEKS. REMOVE WEEKLY WITH POLISH REMOVER 06/22/21   Marzetta Board, DPM  EPINEPHrine 0.3 mg/0.3 mL IJ SOAJ injection INJECT 0.3 MG IN THE MUSCLE AS NEEDED FOR ANAPHYLAXIS 08/03/22   Althea Charon, FNP  esomeprazole (NEXIUM) 40 MG capsule TAKE ONE CAPSULE BY MOUTH ONCE DAILY AS DIRECTED Patient taking differently: Take 40 mg by mouth daily. 02/06/17   Kozlow, Donnamarie Poag, MD  famotidine (PEPCID) 20 MG tablet  Take 1 tablet (20 mg total) by mouth daily for 14 days. 08/12/22 08/26/22  Talbot Grumbling, FNP  fexofenadine (ALLEGRA) 180 MG tablet Take 1 tablet (180 mg total) by mouth daily. 08/03/22   Althea Charon, FNP  fluticasone Asencion Islam) 50 MCG/ACT nasal spray Use 1 to 2 sprays in each nostril once a day as needed for stuffy nose 08/03/22   Althea Charon, FNP  fluticasone-salmeterol (ADVAIR HFA) [phone] MCG/ACT inhaler INHALE 2 PUFFS BY MOUTH TWICE DAILY 08/03/22   Althea Charon, FNP  glipiZIDE (GLUCOTROL XL) 5 MG 24 hr tablet TAKE 1 TABLET(5 MG) BY MOUTH DAILY WITH BREAKFAST FOR DIABETES 07/29/22   Pleas [name], NP  glucose blood (ONETOUCH ULTRA) test strip USE AS DIRECTED UP TO FOUR TIMES DAILY. 02/20/22   Pleas [name], NP  glucose blood test strip Use as instructed to test up to 4 times a day DX E11.9 01/22/21   Pleas [name], NP  guaiFENesin (ROBITUSSIN) 100 MG/5ML liquid Take 5 mLs by mouth every 4 (four) hours as needed for cough or to loosen phlegm. 05/19/22   Isla Pence, MD  hydrochlorothiazide (HYDRODIURIL) 25 MG tablet TAKE 1 TABLET(25 MG) BY MOUTH DAILY FOR BLOOD PRESSURE 07/03/22   Pleas [name], NP  ibuprofen (ADVIL) 800 MG tablet Take 800 mg by mouth 3 (three) times daily as needed. 01/20/21   [provider]  LANCETS MICRO THIN 33G MISC USE TO TEST BLOOD SUGAR ONCE A DAY  08/21/17   Tresa Garter, MD  levothyroxine (SYNTHROID) 150 MCG tablet Take 1 tablet (150 mcg total) by mouth daily. 03/18/22   Elayne Snare, MD  metFORMIN (GLUCOPHAGE-XR) 500 MG 24 hr tablet TAKE 2 TABLETS BY MOUTH IN THE MORNING AND IN THE EVENING FOR DIABETES 07/25/22   Pleas [name], NP  montelukast (SINGULAIR) 10 MG tablet TAKE 1 TABLET(10 MG) BY MOUTH DAILY 08/03/22   Althea Charon, FNP  Multiple Vitamin (MULTIVITAMIN) tablet Take 1 tablet by mouth daily.    [provider]  olopatadine (PATANOL) 0.1 % ophthalmic solution Place 1 drop into both eyes 2 (two) times  daily. 08/12/22   Talbot Grumbling, FNP  ONE TOUCH ULTRA TEST test strip TEST BLOOD GLUCOSE THREE TIMES DAILY AS DIRECTED 10/12/18   Pleas [name], NP  potassium chloride SA (KLOR-CON M) 20 MEQ tablet TAKE 1 TABLET(20 MEQ) BY MOUTH DAILY 07/25/22   Pleas [name], NP  predniSONE (DELTASONE) 20 MG tablet Take 1 tablet (20 mg total) by mouth daily for 5 days. 08/12/22 08/17/22  Talbot Grumbling, FNP  rosuvastatin (CRESTOR) 5 MG tablet TAKE 1 TABLET BY MOUTH EVERY DAY FOR CHOLESTEROL 07/29/22   Pleas [name], NP      Allergies    Lisinopril, Penicillins, Levaquin [levofloxacin in d5w], Tessalon [benzonatate], and Aspirin    Review of Systems   Review of Systems  Neurological:  Positive for dizziness.    Physical Exam Updated Vital Signs BP 128/88 (BP Location: Right Arm)   Pulse 64   Temp 98.2 F (36.8 C) (Oral)   Resp 15   Ht 6\' 2"  (1.88 m)   Wt 128.6 kg   SpO2 100%   BMI 36.40 kg/m  Physical Exam Vitals and nursing note reviewed.  Constitutional:      Appearance: He is well-developed.  HENT:     Head: Normocephalic and atraumatic.     Mouth/Throat:     Mouth: Mucous membranes are moist.  Eyes:     Pupils: Pupils are equal, round, and reactive to light.  Cardiovascular:     Rate and Rhythm: Normal rate.  Pulmonary:     Effort: Pulmonary effort is normal. No respiratory distress.  Abdominal:     General: There is no distension.  Musculoskeletal:        General: Normal range of motion.     Cervical back: Normal range of motion.  Skin:    General: Skin is warm and dry.  Neurological:     General: No focal deficit present.     Mental Status: He is alert.     Comments: No altered mental status, able to give full seemingly accurate history.  Face is symmetric, EOM's intact, pupils equal and reactive, vision intact, tongue and uvula midline without deviation. Upper and Lower extremity motor 5/5, intact pain perception in distal extremities, 2+ reflexes in  biceps, patella and achilles tendons. Able to perform finger to nose normal with both hands. Walks without assistance or evident ataxia.      ED Results / Procedures / Treatments   Labs (all labs ordered are listed, but only abnormal results are displayed) Labs Reviewed  COMPREHENSIVE METABOLIC PANEL - Abnormal; Notable for the following components:      Result Value   Potassium 2.9 (*)    Chloride 96 (*)    Glucose, Bld 119 (*)    Creatinine, Ser 1.45 (*)    All other components within normal limits  URINALYSIS, ROUTINE W  REFLEX MICROSCOPIC - Abnormal; Notable for the following components:   Ketones, ur 5 (*)    Protein, ur 30 (*)    All other components within normal limits  CBG MONITORING, ED - Abnormal; Notable for the following components:   Glucose-Capillary 103 (*)    All other components within normal limits  RESP PANEL BY RT-PCR (RSV, FLU A&B, COVID)  RVPGX2  CBC WITH DIFFERENTIAL/PLATELET  TROPONIN I (HIGH SENSITIVITY)  TROPONIN I (HIGH SENSITIVITY)    EKG None  Radiology No results found.  Procedures Procedures    Medications Ordered in ED Medications  potassium chloride SA (KLOR-CON M) CR tablet 80 mEq (has no administration in time range)    ED Course/ Medical Decision Making/ A&P                             Medical Decision Making Risk Prescription drug management.   Low K as likely cause.  No neurologic changes otherwise suggest stroke or need for further imaging of the same.  No hyperglycemia.  He is on HCTZ at home.  Also could be related the prednisone.  Will increase his potassium at home and follow-up with PCP for reevaluation of symptoms and recheck of potassium.  Final Clinical Impression(s) / ED Diagnoses Final diagnoses:  Dizziness  Paresthesia  Hypokalemia    Rx / DC Orders ED Discharge Orders     None         Garnette Greb, Corene Cornea, MD 08/17/22 0105

## 2022-08-17 NOTE — Discharge Instructions (Signed)
Please double your potassium over the next week and follow up with your PCP to ensure it has improved.

## 2022-08-18 NOTE — Telephone Encounter (Signed)
Transition Care Management Follow-up Telephone Call Date of discharge and from where: 08/16/22 [facility] ED  How have you been since you were released from the hospital? Patient feels a lot better  Any questions or concerns? No  Items Reviewed: Did the pt receive and understand the discharge instructions provided? Yes  Medications obtained and verified? Yes  Other?  N/A Any new allergies since your discharge? No  Dietary orders reviewed? No Do you have support at home? Yes   Home Care and Equipment/Supplies: Were home health services ordered? no If so, what is the name of the agency?   Has the agency set up a time to come to the patient's home? no Were any new equipment or medical supplies ordered?  No What is the name of the medical supply agency? N/A Were you able to get the supplies/equipment? not applicable Do you have any questions related to the use of the equipment or supplies? No  Functional Questionnaire: (I = Independent and D = Dependent) ADLs: I  Bathing/Dressing- i  Meal Prep- I  Eating- I  Maintaining continence- I  Transferring/Ambulation- I  Managing Meds- I  Follow up appointments reviewed:  PCP Hospital f/u appt confirmed? Yes  Scheduled to see [name]  on 08/23/22 @ 8:00am. [facility] f/u appt confirmed? No   Are transportation arrangements needed? No  If their condition worsens, is the pt aware to call PCP or go to the Emergency Dept.? Yes Was the patient provided with contact information for the PCP's office or ED? Yes Was to pt encouraged to call back with questions or concerns? Yes

## 2022-08-23 ENCOUNTER — Encounter: Payer: Self-pay | Admitting: Primary Care

## 2022-08-23 ENCOUNTER — Ambulatory Visit (INDEPENDENT_AMBULATORY_CARE_PROVIDER_SITE_OTHER): Payer: BC Managed Care – PPO | Admitting: Primary Care

## 2022-08-23 VITALS — BP 132/86 | HR 91 | Temp 97.7°F | Ht 73.5 in | Wt 290.0 lb

## 2022-08-23 DIAGNOSIS — E1165 Type 2 diabetes mellitus with hyperglycemia: Secondary | ICD-10-CM | POA: Diagnosis not present

## 2022-08-23 DIAGNOSIS — E890 Postprocedural hypothyroidism: Secondary | ICD-10-CM

## 2022-08-23 DIAGNOSIS — E876 Hypokalemia: Secondary | ICD-10-CM

## 2022-08-23 LAB — MICROALBUMIN / CREATININE URINE RATIO
Creatinine,U: 72 mg/dL
Microalb Creat Ratio: 22.5 mg/g (ref 0.0–30.0)
Microalb, Ur: 16.2 mg/dL — ABNORMAL HIGH (ref 0.0–1.9)

## 2022-08-23 LAB — TSH: TSH: 2.19 u[IU]/mL (ref 0.35–5.50)

## 2022-08-23 LAB — POTASSIUM: Potassium: 3.3 mEq/L — ABNORMAL LOW (ref 3.5–5.1)

## 2022-08-23 LAB — HEMOGLOBIN A1C: Hgb A1c MFr Bld: 7.4 % — ABNORMAL HIGH (ref 4.6–6.5)

## 2022-08-23 NOTE — Assessment & Plan Note (Addendum)
Repeat A1C pending.  Continue metformin XR 1000 mg twice daily, glipizide XL 5 mg daily. Foot exam today. Urine microalbumin due and pending.  Follow-up in 3 to 6 months depending on A1c result.  Agree to complete intermittent FMLA as he does travel/miss work often for doctors appointments and various flares of chronic conditions. He will drop off paperwork.

## 2022-08-23 NOTE — Progress Notes (Signed)
Subjective:    Patient ID: Steven [name], male    DOB: Jul 25, 1980, 42 y.o.   MRN: [id number]  HPI  Steven [name] is a very pleasant 42 y.o. male with a history of HIV, CKD, type 2 diabetes, hypothyroidism, hypertension, hypokalemia, lymphedema who presents today for ED follow-up and repeat labs and diabetes follow up.  1) ED Follow Up: He presented to [facility] ED on 08/17/2022 for dizziness.  Evaluated on 08/12/2022 at urgent care for allergic reaction and was currently on prednisone.  During his stay in the ED he was noted to be hypokalemic with potassium level of 2.9.  Given this finding his oral potassium was increased 40 mEq daily.   Today he continues with potassium 40 mEq daily. He is due for repeat potassium pending today. His dizziness has resolved. He is also needing intermittent FMLA for doctors appointments and his missed work dates for ED visits.   2) Hypothyroidism: He is also due for repeat TSH as his level in November was 6.046.  At that time he admitted to incorrect administration of his levothyroxine 150 mcg. Following with endocrinology.   He is taking levothyroxine every morning on an empty stomach with water only. No food or other medications for 30 minutes.  No heartburn medication, iron pills, calcium, vitamin D, or magnesium pills within four hours of taking levothyroxine.   3) Type 2 Diabetes: Current medications for diabetes include: Glipizide XL 5 mg daily, metformin XR 1000 mg BID  He is checking his blood glucose 1-2 times daily and is getting readings of:  AM fasting: 70-80's 2 hours after a meal: 120's  Last A1C: 9.0 in November 2023 Last Eye Exam: UTD Last Foot Exam: Due Pneumonia Vaccination: 2022 Urine Microalbumin: Due Statin: atorvastatin   Dietary changes since last visit: Avoiding late eating. Working on cutting back snack foods and junk foods.    Exercise: Walking at work.   BP Readings from Last 3 Encounters:  08/23/22 132/86  08/17/22 124/78   08/12/22 135/83      Review of Systems  Respiratory:  Negative for shortness of breath.   Cardiovascular:  Negative for chest pain.  Neurological:  Negative for dizziness and numbness.         Past Medical History:  Diagnosis Date   Asthma    Cancer ([place])    Colitis 05/27/2011   Diabetes mellitus without complication (HCC)    HIV positive ([place]) 03/23/09   Genotype Y181C   HTN (hypertension)    Kaposi's sarcoma    Syphilis 03/23/09-   1:2    Social History   Socioeconomic History   Marital status: Single    Spouse name: Not on file   Number of children: Not on file   Years of education: Not on file   Highest education level: Not on file  Occupational History   Not on file  Tobacco Use   Smoking status: Never   Smokeless tobacco: Never  Vaping Use   Vaping Use: Never used  Substance and Sexual Activity   Alcohol use: Yes    Alcohol/week: 0.0 standard drinks of alcohol    Comment: socially - less now   Drug use: No   Sexual activity: Not Currently    Partners: Male    Birth control/protection: Condom    Comment: pt. declined condoms  Other Topics Concern   Not on file  Social History Narrative   Single.    No children.   Works in Therapist, art  Enjoys DJ, traveling.    Social Determinants of Health   Financial Resource Strain: Not on file  Food Insecurity: Not on file  Transportation Needs: Not on file  Physical Activity: Not on file  Stress: Not on file  Social Connections: Not on file  Intimate Partner Violence: Not on file    Past Surgical History:  Procedure Laterality Date   BRAIN SURGERY     IR GENERIC HISTORICAL  02/12/2016   IR REMOVAL TUN ACCESS W/ PORT W/O FL MOD SED 02/12/2016 [name], MD [facility]    Family History  Problem Relation Age of Onset   Pancreatic cancer Mother    Cancer Mother    Diabetes Paternal Grandmother    Thyroid disease Paternal Grandmother     Allergies  Allergen Reactions   Lisinopril Swelling     Swelling of lower lip while on lisinopril   Penicillins Hives and Swelling    REACTION: rash and swelling   Levaquin [Levofloxacin In D5w] Rash   Tessalon [Benzonatate] Rash   Aspirin Nausea Only    Current Outpatient Medications on File Prior to Visit  Medication Sig Dispense Refill   abacavir-dolutegravir-lamiVUDine (TRIUMEQ) 600-50-300 MG tablet Take 1 tablet by mouth daily. 30 tablet 11   albuterol (VENTOLIN HFA) 108 (90 Base) MCG/ACT inhaler Inhale 2 puffs every 4-6 hours as as needed for cough, wheeze, tightness in chest, or shortness of breath 18 g 1   amLODipine (NORVASC) 10 MG tablet TAKE 1 TABLET(10 MG) BY MOUTH DAILY FOR BLOOD PRESSURE 90 tablet 3   Blood Glucose Monitoring Suppl (ONETOUCH VERIO) w/Device KIT USE AS DIRECTED TO TEST UP TO FOUR TIMES DAILY     ciclopirox (PENLAC) 8 % solution APPLY 1 COAT TO TOENAIL EVERY DAY FOR 48 WEEKS. REMOVE WEEKLY WITH POLISH REMOVER 6.6 mL 11   EPINEPHrine 0.3 mg/0.3 mL IJ SOAJ injection INJECT 0.3 MG IN THE MUSCLE AS NEEDED FOR ANAPHYLAXIS 2 each 1   esomeprazole (NEXIUM) 40 MG capsule TAKE ONE CAPSULE BY MOUTH ONCE DAILY AS DIRECTED (Patient taking differently: Take 40 mg by mouth daily.) 30 capsule 0   fexofenadine (ALLEGRA) 180 MG tablet Take 1 tablet (180 mg total) by mouth daily. 15 tablet 0   fluticasone (FLONASE) 50 MCG/ACT nasal spray Use 1 to 2 sprays in each nostril once a day as needed for stuffy nose 16 g 5   fluticasone-salmeterol (ADVAIR HFA) 230-21 MCG/ACT inhaler INHALE 2 PUFFS BY MOUTH TWICE DAILY 12 g 5   glipiZIDE (GLUCOTROL XL) 5 MG 24 hr tablet TAKE 1 TABLET(5 MG) BY MOUTH DAILY WITH BREAKFAST FOR DIABETES 90 tablet 0   glucose blood (ONETOUCH ULTRA) test strip USE AS DIRECTED UP TO FOUR TIMES DAILY. 300 strip 3   glucose blood test strip Use as instructed to test up to 4 times a day DX E11.9 100 each 3   hydrochlorothiazide (HYDRODIURIL) 25 MG tablet TAKE 1 TABLET(25 MG) BY MOUTH DAILY FOR BLOOD PRESSURE 90 tablet 3    ibuprofen (ADVIL) 800 MG tablet Take 800 mg by mouth 3 (three) times daily as needed.     LANCETS MICRO THIN 33G MISC USE TO TEST BLOOD SUGAR ONCE A DAY 100 each 2   levothyroxine (SYNTHROID) 150 MCG tablet Take 1 tablet (150 mcg total) by mouth daily. 90 tablet 1   metFORMIN (GLUCOPHAGE-XR) 500 MG 24 hr tablet TAKE 2 TABLETS BY MOUTH IN THE MORNING AND IN THE EVENING FOR DIABETES 360 tablet 1   montelukast (SINGULAIR)  10 MG tablet TAKE 1 TABLET(10 MG) BY MOUTH DAILY 90 tablet 1   Multiple Vitamin (MULTIVITAMIN) tablet Take 1 tablet by mouth daily.     olopatadine (PATANOL) 0.1 % ophthalmic solution Place 1 drop into both eyes 2 (two) times daily. 5 mL 0   ONE TOUCH ULTRA TEST test strip TEST BLOOD GLUCOSE THREE TIMES DAILY AS DIRECTED 100 each 2   potassium chloride SA (KLOR-CON M) 20 MEQ tablet TAKE 1 TABLET(20 MEQ) BY MOUTH DAILY 90 tablet 3   rosuvastatin (CRESTOR) 5 MG tablet TAKE 1 TABLET BY MOUTH EVERY DAY FOR CHOLESTEROL 90 tablet 2   azithromycin (ZITHROMAX) 500 MG tablet Take 1 tablet (500 mg total) by mouth daily. (Patient not taking: Reported on 08/03/2022) 5 tablet 0   famotidine (PEPCID) 20 MG tablet Take 1 tablet (20 mg total) by mouth daily for 14 days. (Patient not taking: Reported on 08/23/2022) 30 tablet 0   guaiFENesin (ROBITUSSIN) 100 MG/5ML liquid Take 5 mLs by mouth every 4 (four) hours as needed for cough or to loosen phlegm. (Patient not taking: Reported on 08/23/2022) 120 mL 0   Current Facility-Administered Medications on File Prior to Visit  Medication Dose Route Frequency Provider Last Rate Last Admin   omalizumab Arvid Right) injection 300 mg  300 mg Subcutaneous Q14 Days Rexene Alberts M, DO   300 mg at 06/13/22 1038    BP 132/86   Pulse 91   Temp 97.7 F (36.5 C) (Temporal)   Ht 6' 1.5" (1.867 m)   Wt 290 lb (131.5 kg)   SpO2 96%   BMI 37.74 kg/m  Objective:   Physical Exam Cardiovascular:     Rate and Rhythm: Normal rate and regular rhythm.  Pulmonary:      Effort: Pulmonary effort is normal.     Breath sounds: Normal breath sounds. No wheezing or rales.  Musculoskeletal:     Cervical back: Neck supple.  Skin:    General: Skin is warm and dry.  Neurological:     Mental Status: He is alert and oriented to person, place, and time.           Assessment & Plan:  Hypothyroidism, postradioiodine therapy Assessment & Plan: He seems to be taking levothyroxine correctly.   Repeat TSH pending. Continue levothyroxine 150 mcg daily. Following with endocrinology  Orders: -     TSH  Hypokalemia Assessment & Plan: Reviewed ED notes, labs.  Continue potassium chloride 40 mEq daily. Repeat potassium level pending.  Will update prescription if needed.  Orders: -     Potassium  Type 2 diabetes mellitus with hyperglycemia, without long-term current use of insulin (HCC) Assessment & Plan: Repeat A1C pending.  Continue metformin XR 1000 mg twice daily, glipizide XL 5 mg daily. Foot exam today. Urine microalbumin due and pending.  Follow-up in 3 to 6 months depending on A1c result.  Agree to complete intermittent FMLA as he does travel/miss work often for doctors appointments and various flares of chronic conditions. He will drop off paperwork.  Orders: -     Microalbumin / creatinine urine ratio -     Hemoglobin A1c        Pleas [name], NP

## 2022-08-23 NOTE — Assessment & Plan Note (Signed)
He seems to be taking levothyroxine correctly.   Repeat TSH pending. Continue levothyroxine 150 mcg daily. Following with endocrinology

## 2022-08-23 NOTE — Assessment & Plan Note (Signed)
Reviewed ED notes, labs.  Continue potassium chloride 40 mEq daily. Repeat potassium level pending.  Will update prescription if needed.

## 2022-08-23 NOTE — Patient Instructions (Signed)
Stop by the lab prior to leaving today. I will notify you of your results once received.   It was a pleasure to see you today!  

## 2022-08-24 ENCOUNTER — Other Ambulatory Visit: Payer: Self-pay | Admitting: Primary Care

## 2022-08-24 ENCOUNTER — Encounter (HOSPITAL_COMMUNITY): Payer: Self-pay

## 2022-08-24 ENCOUNTER — Ambulatory Visit (HOSPITAL_COMMUNITY)
Admission: EM | Admit: 2022-08-24 | Discharge: 2022-08-24 | Disposition: A | Discharge status: Home / Self Care | Payer: BC Managed Care – PPO | Attending: Family Medicine | Admitting: Family Medicine

## 2022-08-24 DIAGNOSIS — T7840XA Allergy, unspecified, initial encounter: Secondary | ICD-10-CM

## 2022-08-24 DIAGNOSIS — E876 Hypokalemia: Secondary | ICD-10-CM

## 2022-08-24 DIAGNOSIS — I10 Essential (primary) hypertension: Secondary | ICD-10-CM

## 2022-08-24 MED ORDER — DEXAMETHASONE SODIUM PHOSPHATE 10 MG/ML IJ SOLN
INTRAMUSCULAR | Status: AC
  Filled 2022-08-24: qty 1

## 2022-08-24 MED ORDER — DEXAMETHASONE SODIUM PHOSPHATE 10 MG/ML IJ SOLN
10.0000 mg | Freq: Once | INTRAMUSCULAR | Status: AC
  Administered 2022-08-24: 10 mg via INTRAMUSCULAR

## 2022-08-24 MED ORDER — PREDNISONE 20 MG PO TABS: 40.0000 mg | ORAL_TABLET | Freq: Every day | ORAL | 0 refills | Status: DC

## 2022-08-24 NOTE — Discharge Instructions (Signed)
Your blood pressure was noted to be elevated during your visit today. If you are currently taking medication for high blood pressure, please ensure you are taking this as directed. If you do not have a history of high blood pressure and your blood pressure remains persistently elevated, you may need to begin taking a medication at some point. You may return here within the next few days to recheck if unable to see your primary care provider or if you do not have a one.  BP (!) 161/102 (BP Location: Left Arm)   Pulse 89   Temp 98.1 F (36.7 C) (Oral)   Resp 12   SpO2 95%   BP Readings from Last 3 Encounters:  08/24/22 (!) 161/102  08/23/22 132/86  08/17/22 124/78

## 2022-08-24 NOTE — ED Triage Notes (Signed)
Pt is here for possible allergic reaction , eyes are red and swollen to a fragrance today

## 2022-08-25 ENCOUNTER — Other Ambulatory Visit: Payer: Self-pay | Admitting: Endocrinology

## 2022-08-25 DIAGNOSIS — E890 Postprocedural hypothyroidism: Secondary | ICD-10-CM

## 2022-08-25 NOTE — ED Provider Notes (Signed)
[place]   [id number] 08/24/22 Arrival Time: [place]:  1. Allergic reaction, initial encounter   2. Elevated blood pressure reading in office with diagnosis of hypertension    No swallowing/respiratory difficulties. Begin: Meds ordered this encounter  Medications   dexamethasone (DECADRON) injection 10 mg   predniSONE (DELTASONE) 20 MG tablet    Sig: Take 2 tablets (40 mg total) by mouth daily.    Dispense:  10 tablet    Refill:  0    Reviewed expectations re: course of current medical issues. Questions answered. Outlined signs and symptoms indicating need for more acute intervention. Patient verbalized understanding. After Visit Summary given.   SUBJECTIVE: History from: patient. Steven [name] is a 42 y.o. male with h/o frequent allergic rxns to various triggers who presents for evaluation of a possible allergic reaction. Possible trigger: thinks a perfume. Reports itching rash around eyes and on face mainly. Onset abrupt, first noted today. Clinical course: stable. Denies: chest pain, productive cough, shortness of breath, and wheezing. No tx PTA.  OBJECTIVE:  Vitals:   08/24/22 1838  BP: (!) 161/102  Pulse: 89  Resp: 12  Temp: 98.1 F (36.7 C)  TempSrc: Oral  SpO2: 95%    General appearance: alert; no distress Eyes: PERRLA; EOMI; conjunctiva normal HENT: normocephalic; atraumatic; nasal mucosa normal; oral mucosa normal Neck: supple  Lungs: unlabored Extremities: no cyanosis or edema; symmetrical with no gross deformities Skin: warm and dry; erythematous rash around eyes an on cheeks; few wheals Neurologic: normal gait; normal symmetric reflexes Psychological: alert and cooperative; normal mood and affect  No results found.  Allergies  Allergen Reactions   Lisinopril Swelling    Swelling of lower lip while on lisinopril   Penicillins Hives and Swelling    REACTION: rash and swelling   Levaquin [Levofloxacin In D5w] Rash    Tessalon [Benzonatate] Rash   Aspirin Nausea Only    Past Medical History:  Diagnosis Date   Asthma    Cancer ([place])    Colitis 05/27/2011   Diabetes mellitus without complication (HCC)    HIV positive ([place]) 03/23/09   Genotype Y181C   HTN (hypertension)    Kaposi's sarcoma    Syphilis 03/23/09-   1:2   Social History   Socioeconomic History   Marital status: Single    Spouse name: Not on file   Number of children: Not on file   Years of education: Not on file   Highest education level: Not on file  Occupational History   Not on file  Tobacco Use   Smoking status: Never   Smokeless tobacco: Never  Vaping Use   Vaping Use: Never used  Substance and Sexual Activity   Alcohol use: Yes    Alcohol/week: 0.0 standard drinks of alcohol    Comment: socially - less now   Drug use: No   Sexual activity: Not Currently    Partners: Male    Birth control/protection: Condom    Comment: pt. declined condoms  Other Topics Concern   Not on file  Social History Narrative   Single.    No children.   Works in Therapist, art   Enjoys DJ, traveling.    Social Determinants of Health   Financial Resource Strain: Not on file  Food Insecurity: Not on file  Transportation Needs: Not on file  Physical Activity: Not on file  Stress: Not on file  Social Connections: Not on file  Intimate Partner Violence: Not on  file   Family History  Problem Relation Age of Onset   Pancreatic cancer Mother    Cancer Mother    Diabetes Paternal Grandmother    Thyroid disease Paternal Grandmother    Past Surgical History:  Procedure Laterality Date   BRAIN SURGERY     IR GENERIC HISTORICAL  02/12/2016   IR REMOVAL TUN ACCESS W/ PORT W/O FL MOD SED 02/12/2016 [name], MD [facility]     Vanessa Kick, MD 08/25/22 1002

## 2022-08-26 ENCOUNTER — Telehealth: Payer: Self-pay | Admitting: Primary Care

## 2022-08-26 MED ORDER — POTASSIUM CHLORIDE CRYS ER 20 MEQ PO TBCR: 40.0000 meq | EXTENDED_RELEASE_TABLET | Freq: Every day | ORAL | 3 refills | Status: DC

## 2022-08-26 NOTE — Telephone Encounter (Signed)
Completed form has been faxed to fax# [phone].  Received fax confirmation.  Copies have been made for patient, scanning, and myself.  Patient's copy placed in front office for pickup.  Notified patient by phone.

## 2022-08-26 NOTE — Telephone Encounter (Signed)
PPW placed on Steven [name] desk for review.

## 2022-08-26 NOTE — Telephone Encounter (Signed)
Patient came by and dropped off paperwork he need to be completed. Place in [place] box up front. Thank you!

## 2022-08-26 NOTE — Telephone Encounter (Signed)
Completed and placed on Kate's desk 

## 2022-08-26 NOTE — Telephone Encounter (Signed)
We received an accommodation form for patient.  Placed in your inbox for completion and signing.  No due date listed on form.

## 2022-09-08 ENCOUNTER — Encounter: Payer: Self-pay | Admitting: Podiatry

## 2022-09-10 ENCOUNTER — Ambulatory Visit (HOSPITAL_COMMUNITY)
Admission: EM | Admit: 2022-09-10 | Discharge: 2022-09-10 | Disposition: A | Discharge status: Home / Self Care | Payer: BC Managed Care – PPO | Attending: Nurse Practitioner | Admitting: Nurse Practitioner

## 2022-09-10 ENCOUNTER — Encounter (HOSPITAL_COMMUNITY): Payer: Self-pay

## 2022-09-10 ENCOUNTER — Telehealth (HOSPITAL_COMMUNITY): Payer: Self-pay

## 2022-09-10 DIAGNOSIS — T7840XA Allergy, unspecified, initial encounter: Secondary | ICD-10-CM

## 2022-09-10 MED ORDER — PREDNISONE 10 MG PO TABS: 20.0000 mg | ORAL_TABLET | Freq: Every day | ORAL | 0 refills | Status: DC

## 2022-09-10 MED ORDER — METHYLPREDNISOLONE SODIUM SUCC 125 MG IJ SOLR
INTRAMUSCULAR | Status: AC
  Filled 2022-09-10: qty 2

## 2022-09-10 MED ORDER — METHYLPREDNISOLONE SODIUM SUCC 125 MG IJ SOLR
60.0000 mg | Freq: Once | INTRAMUSCULAR | Status: AC
  Administered 2022-09-10: 60 mg via INTRAMUSCULAR

## 2022-09-10 NOTE — ED Triage Notes (Signed)
Pt states that he feels he's having an allergic reaction. Not sure from what. Works in a call center. Swollen eyes. Sinuses. Hasn't taken anything.

## 2022-09-10 NOTE — Discharge Instructions (Addendum)
You have been treated with methylprednisolone 60 mg IM and prescribed a prednisone '20mg'$  daily.  Follow up if symptoms persist or get worse.

## 2022-09-10 NOTE — ED Provider Notes (Signed)
[facility]    CSN: [id number] Arrival date & time: 09/10/22  1652      History   Chief Complaint Chief Complaint  Patient presents with   Allergic Reaction    HPI Steven [name] is a 42 y.o. male.   HPI  Past Medical History:  Diagnosis Date   Asthma    Cancer ([place])    Colitis 05/27/2011   Diabetes mellitus without complication (HCC)    HIV positive ([place]) 03/23/09   Genotype [id number]   HTN (hypertension)    Kaposi's sarcoma    Syphilis 03/23/09-   1:2    Patient Active Problem List   Diagnosis Date Noted   Routine screening for STI (sexually transmitted infection) 07/28/2022   Sinusitis, acute frontal 07/28/2022   Chronic shoulder pain 12/16/2021   Hypokalemia 08/06/2021   Preoperative clearance 10/29/2020   Hyperlipidemia 05/28/2020   Preventative health care 08/10/2018   Severe persistent asthma without complication [id number]   Allergic reaction 06/15/2018   Food allergy 06/15/2018   Seasonal and perennial allergic rhinoconjunctivitis 06/15/2018   HIV infection ([place]) 02/09/2018   Gastroesophageal reflux disease 03/28/2017   Type 2 diabetes mellitus with hyperglycemia ([place]) 03/25/2015   CKD (chronic kidney disease) stage 3, GFR 30-59 ml/min ([place]) 03/23/2015   Thrush, oral 03/23/2015   Essential hypertension 11/24/2014   Hypothyroidism, postradioiodine therapy 01/13/2014   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 Authorizing Provider  abacavir-dolutegravir-lamiVUDine (TRIUMEQ) 600-50-300 MG tablet  Take 1 tablet by mouth daily. 07/28/22  Yes Comer, Okey Regal, MD  albuterol (VENTOLIN HFA) 108 (90 Base) MCG/ACT inhaler Inhale 2 puffs every 4-6 hours as as needed for cough, wheeze, tightness in chest, or shortness of breath 08/03/22  Yes Althea Charon, FNP  amLODipine (NORVASC) 10 MG tablet TAKE 1 TABLET(10 MG) BY MOUTH DAILY FOR BLOOD PRESSURE 07/25/22  Yes Pleas [name], NP  Blood Glucose Monitoring Suppl (ONETOUCH VERIO) w/Device KIT USE AS DIRECTED TO TEST UP TO FOUR TIMES DAILY 02/19/21  Yes [provider]  ciclopirox (PENLAC) 8 % solution APPLY 1 COAT TO TOENAIL EVERY DAY FOR 48 WEEKS. REMOVE WEEKLY WITH POLISH REMOVER 06/22/21  Yes Marzetta Board, DPM  EPINEPHrine 0.3 mg/0.3 mL IJ SOAJ injection INJECT 0.3 MG IN THE MUSCLE AS NEEDED FOR ANAPHYLAXIS 08/03/22  Yes Althea Charon, FNP  esomeprazole (NEXIUM) 40 MG capsule TAKE ONE CAPSULE BY MOUTH ONCE DAILY AS DIRECTED Patient taking differently: Take 40 mg by mouth daily. 02/06/17  Yes Kozlow, Donnamarie Poag, MD  fexofenadine (ALLEGRA) 180 MG tablet Take 1 tablet (180 mg total) by mouth daily. 08/03/22  Yes Althea Charon, FNP  fluticasone Asencion Islam) 50 MCG/ACT nasal spray Use 1 to 2 sprays in each nostril once a day as needed for stuffy nose 08/03/22  Yes Althea Charon, FNP  fluticasone-salmeterol (ADVAIR HFA) 230-21 MCG/ACT inhaler INHALE 2 PUFFS BY MOUTH TWICE DAILY 08/03/22  Yes Althea Charon, FNP  glipiZIDE (GLUCOTROL XL) 5 MG 24 hr tablet TAKE 1 TABLET(5  MG) BY MOUTH DAILY WITH BREAKFAST FOR DIABETES 07/29/22  Yes Pleas [name], NP  glucose blood (ONETOUCH ULTRA) test strip USE AS DIRECTED UP TO FOUR TIMES DAILY. 02/20/22  Yes Pleas [name], NP  glucose blood test strip Use as instructed to test up to 4 times a day DX E11.9 01/22/21  Yes Pleas [name], NP  ibuprofen (ADVIL) 800 MG tablet Take 800 mg by mouth 3 (three) times daily as needed. 01/20/21  Yes [provider]  LANCETS MICRO THIN 33G MISC USE TO  TEST BLOOD SUGAR ONCE A DAY 08/21/17  Yes Jegede, Olugbemiga E, MD  levothyroxine (SYNTHROID) 150 MCG tablet TAKE 1 TABLET(150 MCG) BY MOUTH DAILY 08/25/22  Yes Elayne Snare, MD  metFORMIN (GLUCOPHAGE-XR) 500 MG 24 hr tablet TAKE 2 TABLETS BY MOUTH IN THE MORNING AND IN THE EVENING FOR DIABETES 07/25/22  Yes Pleas [name], NP  montelukast (SINGULAIR) 10 MG tablet TAKE 1 TABLET(10 MG) BY MOUTH DAILY 08/03/22  Yes Althea Charon, FNP  Multiple Vitamin (MULTIVITAMIN) tablet Take 1 tablet by mouth daily.   Yes [provider]  olopatadine (PATANOL) 0.1 % ophthalmic solution Place 1 drop into both eyes 2 (two) times daily. 08/12/22  Yes Talbot Grumbling, FNP  ONE TOUCH ULTRA TEST test strip TEST BLOOD GLUCOSE THREE TIMES DAILY AS DIRECTED 10/12/18  Yes Pleas [name], NP  potassium chloride SA (KLOR-CON M) 20 MEQ tablet Take 2 tablets (40 mEq total) by mouth daily. 08/26/22  Yes Pleas [name], NP  predniSONE (DELTASONE) 10 MG tablet Take 2 tablets (20 mg total) by mouth daily. 09/10/22  Yes Camesha Farooq, Diona Foley, NP  rosuvastatin (CRESTOR) 5 MG tablet TAKE 1 TABLET BY MOUTH EVERY DAY FOR CHOLESTEROL 07/29/22  Yes Pleas [name], NP  famotidine (PEPCID) 20 MG tablet Take 1 tablet (20 mg total) by mouth daily for 14 days. Patient not taking: Reported on 08/23/2022 08/12/22 08/26/22  Talbot Grumbling, FNP  hydrochlorothiazide (HYDRODIURIL) 25 MG tablet TAKE 1 TABLET(25 MG) BY MOUTH DAILY FOR BLOOD PRESSURE 07/03/22   Pleas [name], NP    Family History Family History  Problem Relation Age of Onset   Pancreatic cancer Mother    Cancer Mother    Diabetes Paternal Grandmother    Thyroid disease Paternal Grandmother     Social History Social History   Tobacco Use   Smoking status: Never   Smokeless tobacco: Never  Vaping Use   Vaping Use: Never used  Substance Use Topics   Alcohol use: Yes    Alcohol/week: 0.0 standard drinks of alcohol    Comment: socially - less now    Drug use: No     Allergies   Lisinopril, Penicillins, Levaquin [levofloxacin in d5w], Tessalon [benzonatate], and Aspirin   Review of Systems Review of Systems   Physical Exam Triage Vital Signs ED Triage Vitals  Enc Vitals Group     BP 09/10/22 1736 (!) 139/91     Pulse Rate 09/10/22 1736 89     Resp 09/10/22 1736 18     Temp 09/10/22 1736 98.3 F (36.8 C)     Temp Source 09/10/22 1736 Oral     SpO2 09/10/22 1736 94 %     Weight 09/10/22 1734 288 lb (130.6 kg)     Height --      Head Circumference --      Peak Flow --      Pain Score 09/10/22 1734 0  Pain Loc --      Pain Edu? --      Excl. in [place]? --    No data found.  Updated Vital Signs BP (!) 139/91 (BP Location: Right Arm)   Pulse 89   Temp 98.3 F (36.8 C) (Oral)   Resp 18   Wt 288 lb (130.6 kg)   SpO2 94%   BMI 37.48 kg/m   Visual Acuity Right Eye Distance:   Left Eye Distance:   Bilateral Distance:    Right Eye Near:   Left Eye Near:    Bilateral Near:     Physical Exam   UC Treatments / Results  Labs (all labs ordered are listed, but only abnormal results are displayed) Labs Reviewed - No data to display  EKG   Radiology No results found.  Procedures Procedures (including critical care time)  Medications Ordered in UC Medications  methylPREDNISolone sodium succinate (SOLU-MEDROL) 125 mg/2 mL injection 60 mg (has no administration in time range)    Initial Impression / Assessment and Plan / UC Course  I have reviewed the triage vital signs and the nursing notes.  Pertinent labs & imaging results that were available during my care of the patient were reviewed by me and considered in my medical decision making (see chart for details).     Allergic reaction Final Clinical Impressions(s) / UC Diagnoses   Final diagnoses:  Allergic reaction, initial encounter     Discharge Instructions      You have been treated with      ED Prescriptions     Medication Sig  Dispense Auth. Provider   predniSONE (DELTASONE) 10 MG tablet Take 2 tablets (20 mg total) by mouth daily. 15 tablet Vevelyn Francois, NP      PDMP not reviewed this encounter.

## 2022-09-14 ENCOUNTER — Ambulatory Visit: Payer: BC Managed Care – PPO | Admitting: Allergy

## 2022-09-20 ENCOUNTER — Telehealth: Payer: Self-pay | Admitting: Primary Care

## 2022-09-20 NOTE — Progress Notes (Deleted)
Follow Up Note  RE: Steven [name] MRN: [id number] DOB: Nov 16, 1980 Date of Office Visit: 09/21/2022  Referring provider: Pleas [name], NP Primary care provider: Pleas [name], NP  Chief Complaint: No chief complaint on file.  History of Present Illness: I had the pleasure of seeing Steven [name] for a follow up visit at the [facility] on 09/20/2022. He is a 42 y.o. male, who is being followed for asthma, allergic rhinoconjunctivitis, reflux, food allergy and allergic reaction. His previous allergy office visit was on 08/03/2022 with Althea Charon FNP. Today is a regular follow up visit.  Asthma Continue montelukast 10 mg once a day to prevent cough or wheeze Continue Advair 230-2 puffs twice a day with a spacer to prevent cough or wheeze Continue albuterol 2 puffs once every 4 hours as needed for cough or wheeze You may use albuterol 2 puffs 5 to 15 minutes before activity to decrease cough or wheeze Continue Xolair injections 300 mg once every 2 weeks to help control your asthma   Allergic rhinitis Continue allergen avoidance measures directed toward grass pollen, weed pollen, mold, dust mite, cat, and dog as listed below Continue Allegra 180 mg once a day as needed for runny nose or itch Continue Flonase 2 sprays in each nostril once a day as needed for stuffy nose.  In the right nostril, point the applicator out toward the right ear. In the left nostril, point the applicator out toward the left ear Consider saline nasal rinses as needed for nasal symptoms. Use this before any medicated nasal sprays for best result   Allergic conjunctivitis Continue olopatadine 1 drop in each eye once a day as needed for red or itchy eyes   Reflux Continue dietary and lifestyle modifications as listed below Continue Nexium 40 mg once a day as needed for reflux   Food allergy Continue to avoid shellfish and mushroom. In case of an allergic reaction, take Benadryl  50 mg every 4 hours, and if life-threatening symptoms occur, inject with EpiPen 0.3 mg.   Allergic reaction If your symptoms re-occur, begin a journal of events that occurred for up to 6 hours before your symptoms began including foods and beverages consumed, soaps or perfumes you had contact with, and medications.  We will order some labs to look for causes of your reactions. We will call you with results once they are all back.   Your blood pressure was elevated while in the office today.  Please make an appointment with your primary care physician to discuss Call the clinic if this treatment plan is not working well for you.   Follow up in 6 weeks with Dr. Maudie Mercury or sooner if needed.   Asthma, severe persistent Today's spirometry showed some restriction and obstruction Daily controller medication(s): continue Advair 230 2 puffs twice a day with spacer and rinse mouth afterwards Continue Xolair '300mg'$  every 14 days.  Continue montelukast '10mg'$  daily.  Prior to physical activity: May use albuterol rescue inhaler 2 puffs 5 to 15 minutes prior to strenuous physical activities. Rescue medications: May use albuterol rescue inhaler 2 puffs or nebulizer every 4 to 6 hours as needed for shortness of breath, chest tightness, coughing, and wheezing. Monitor frequency of use.  During upper respiratory infections: Start Qvar 80 2 puffs twice a day for 1-2 weeks. Asthma control goals:  Full participation in all desired activities (may need albuterol before activity) Albuterol use two times or less a week on average (not  counting use with activity) Cough interfering with sleep two times or less a month Oral steroids no more than once a year No hospitalizations   Seasonal and perennial allergic rhinoconjunctivitis Past history - 2015 skin testing was positive to grass, ragweed, weed, mold, dust mites, cat and dog. Read about allergy injections - if you are interested in doing allergy injections then  recommend that you get repeat testing and schedule for allergy testing - must be off antihistamines for 3 days before.   Take Flonase 1-2 sprays per nostril daily as needed for nasal congestion.  May use over the counter antihistamines such as Zyrtec (cetirizine), Claritin (loratadine), Allegra (fexofenadine), or Xyzal (levocetirizine) daily as needed. May take twice a day if needed.  May use olopatadine eye drops 0.2% once a day as needed for itchy/watery eyes. Continue environmental control measures.    Food allergy Continue to avoid shellfish and mushroom. For mild symptoms you can take over the counter antihistamines such as Benadryl and monitor symptoms closely. If symptoms worsen or if you have severe symptoms including breathing issues, throat closure, significant swelling, whole body hives, severe diarrhea and vomiting, lightheadedness then inject epinephrine and seek immediate medical care afterwards.   Gastroesophageal reflux disease May use Nexium '40mg'$  daily as needed.  Continue heartburn lifestyle modification diet.    08/12/2022 UC visit: "Patient with history of food allergy and frequent allergic reactions presents to urgent care for evaluation of allergic reaction that started approximately 45 to 50 minutes ago. Patient has an EpiPen in his possession but states that "usually if I get to the doctor soon enough, I do not have to use my EpiPen". He states he went to work this afternoon but then had to leave early because he became very itchy to his palms, developed eye itching bilaterally, and swelling to the perioral area. He does not have a rash and denies presence of hives. Denies shortness of breath, cough, throat swelling, chest pain, and recent viral URI/fever/chills. States he ate some enchiladas for lunch today but does not recall any other intake of foods outside of normal diet or exposures/intake of known dietary allergens/environmental allergens. He recently saw his asthma  specialist last month where he received a good checkup and was found to be stable. He does have a history of diabetes, asthma, hypertension, Kaposi's sarcoma, HIV, and allergic rhinoconjunctivitis. He takes Allegra daily for allergies. He has not used his EpiPen before coming to urgent care and states that he has not had to use his EpiPen in "a long time". He is unsure of what exactly triggered this allergic reaction and denies recent changes in bedding, laundry detergent, and personal hygiene products. No recent medication changes, steroid, or antibiotic use.  You have been evaluated today for an allergic reaction. We gave you a steroid in the clinic to help with your symptoms.    You have also been given a prescription for prednisone steroid, please take them as directed starting tomorrow.  Continue taking Allegra once daily. Take famotidine (Pepcid) 20 mg once daily for further suppression of allergic reaction symptoms for the next 7 days. You may use olopatadine eyedrops as directed as needed for itching to the eyes."  08/24/2022 UC visit: "Steven [name] is a 42 y.o. male with h/o frequent allergic rxns to various triggers who presents for evaluation of a possible allergic reaction. Possible trigger: thinks a perfume. Reports itching rash around eyes and on face mainly. Onset abrupt, first noted today. Clinical course: stable. Denies: chest  pain, productive cough, shortness of breath, and wheezing. No tx PTA."  09/2022 UC visit: "He is in today with facial swelling and irritation to sinuses . He reports that he was at work and had an allergic reaction to the fragrances in the room of his co-workers. He denies any tongue swelling or difficulty swallowing. He does have an Epi-pen and this was not used. Denies fever, chills, headache, dizziness, visual changes, shortness of breath, dyspnea on exertion, chest pain, nausea, vomiting  "  Assessment and Plan: Steven [name] is a 42 y.o. male with: No  problem-specific Assessment & Plan notes found for this encounter.  No follow-ups on file.  No orders of the defined types were placed in this encounter.  Lab Orders  No laboratory test(s) ordered today    Diagnostics: Spirometry:  Tracings reviewed. His effort: {Blank single:19197::"Good reproducible efforts.","It was hard to get consistent efforts and there is a question as to whether this reflects a maximal maneuver.","Poor effort, data can not be interpreted."} FVC: ***L FEV1: ***L, ***% predicted FEV1/FVC ratio: ***% Interpretation: {Blank single:19197::"Spirometry consistent with mild obstructive disease","Spirometry consistent with moderate obstructive disease","Spirometry consistent with severe obstructive disease","Spirometry consistent with possible restrictive disease","Spirometry consistent with mixed obstructive and restrictive disease","Spirometry uninterpretable due to technique","Spirometry consistent with normal pattern","No overt abnormalities noted given today's efforts"}.  Please see scanned spirometry results for details.  Skin Testing: {Blank single:19197::"Select foods","Environmental allergy panel","Environmental allergy panel and select foods","Food allergy panel","None","Deferred due to recent antihistamines use"}. *** Results discussed with patient/family.   Medication List:  Current Outpatient Medications  Medication Sig Dispense Refill   abacavir-dolutegravir-lamiVUDine (TRIUMEQ) 600-50-300 MG tablet Take 1 tablet by mouth daily. 30 tablet 11   albuterol (VENTOLIN HFA) 108 (90 Base) MCG/ACT inhaler Inhale 2 puffs every 4-6 hours as as needed for cough, wheeze, tightness in chest, or shortness of breath 18 g 1   amLODipine (NORVASC) 10 MG tablet TAKE 1 TABLET(10 MG) BY MOUTH DAILY FOR BLOOD PRESSURE 90 tablet 3   Blood Glucose Monitoring Suppl (ONETOUCH VERIO) w/Device KIT USE AS DIRECTED TO TEST UP TO FOUR TIMES DAILY     ciclopirox (PENLAC) 8 % solution  APPLY 1 COAT TO TOENAIL EVERY DAY FOR 48 WEEKS. REMOVE WEEKLY WITH POLISH REMOVER 6.6 mL 11   EPINEPHrine 0.3 mg/0.3 mL IJ SOAJ injection INJECT 0.3 MG IN THE MUSCLE AS NEEDED FOR ANAPHYLAXIS 2 each 1   esomeprazole (NEXIUM) 40 MG capsule TAKE ONE CAPSULE BY MOUTH ONCE DAILY AS DIRECTED (Patient taking differently: Take 40 mg by mouth daily.) 30 capsule 0   famotidine (PEPCID) 20 MG tablet Take 1 tablet (20 mg total) by mouth daily for 14 days. (Patient not taking: Reported on 08/23/2022) 30 tablet 0   fexofenadine (ALLEGRA) 180 MG tablet Take 1 tablet (180 mg total) by mouth daily. 15 tablet 0   fluticasone (FLONASE) 50 MCG/ACT nasal spray Use 1 to 2 sprays in each nostril once a day as needed for stuffy nose 16 g 5   fluticasone-salmeterol (ADVAIR HFA) 230-21 MCG/ACT inhaler INHALE 2 PUFFS BY MOUTH TWICE DAILY 12 g 5   glipiZIDE (GLUCOTROL XL) 5 MG 24 hr tablet TAKE 1 TABLET(5 MG) BY MOUTH DAILY WITH BREAKFAST FOR DIABETES 90 tablet 0   glucose blood (ONETOUCH ULTRA) test strip USE AS DIRECTED UP TO FOUR TIMES DAILY. 300 strip 3   glucose blood test strip Use as instructed to test up to 4 times a day DX E11.9 100 each 3   hydrochlorothiazide (HYDRODIURIL) 25 MG  tablet TAKE 1 TABLET(25 MG) BY MOUTH DAILY FOR BLOOD PRESSURE 90 tablet 3   ibuprofen (ADVIL) 800 MG tablet Take 800 mg by mouth 3 (three) times daily as needed.     LANCETS MICRO THIN 33G MISC USE TO TEST BLOOD SUGAR ONCE A DAY 100 each 2   levothyroxine (SYNTHROID) 150 MCG tablet TAKE 1 TABLET(150 MCG) BY MOUTH DAILY 90 tablet 1   metFORMIN (GLUCOPHAGE-XR) 500 MG 24 hr tablet TAKE 2 TABLETS BY MOUTH IN THE MORNING AND IN THE EVENING FOR DIABETES 360 tablet 1   montelukast (SINGULAIR) 10 MG tablet TAKE 1 TABLET(10 MG) BY MOUTH DAILY 90 tablet 1   Multiple Vitamin (MULTIVITAMIN) tablet Take 1 tablet by mouth daily.     olopatadine (PATANOL) 0.1 % ophthalmic solution Place 1 drop into both eyes 2 (two) times daily. 5 mL 0   ONE TOUCH ULTRA  TEST test strip TEST BLOOD GLUCOSE THREE TIMES DAILY AS DIRECTED 100 each 2   potassium chloride SA (KLOR-CON M) 20 MEQ tablet Take 2 tablets (40 mEq total) by mouth daily. 180 tablet 3   predniSONE (DELTASONE) 10 MG tablet Take 2 tablets (20 mg total) by mouth daily. 15 tablet 0   rosuvastatin (CRESTOR) 5 MG tablet TAKE 1 TABLET BY MOUTH EVERY DAY FOR CHOLESTEROL 90 tablet 2   Current Facility-Administered Medications  Medication Dose Route Frequency Provider Last Rate Last Admin   omalizumab Arvid Right) injection 300 mg  300 mg Subcutaneous Q14 Days Garnet Sierras, DO   300 mg at 06/13/22 1038   Allergies: Allergies  Allergen Reactions   Lisinopril Swelling    Swelling of lower lip while on lisinopril   Penicillins Hives and Swelling    REACTION: rash and swelling   Levaquin [Levofloxacin In D5w] Rash   Tessalon [Benzonatate] Rash   Aspirin Nausea Only   I reviewed his past medical history, social history, family history, and environmental history and no significant changes have been reported from his previous visit.  Review of Systems  Constitutional:  Negative for appetite change, chills, fever and unexpected weight change.  HENT:  Negative for congestion and rhinorrhea.   Eyes:  Negative for itching.  Respiratory:  Negative for cough, chest tightness, shortness of breath and wheezing.   Gastrointestinal:  Negative for abdominal pain.  Skin:  Negative for rash.  Neurological:  Negative for headaches.    Objective: There were no vitals taken for this visit. There is no height or weight on file to calculate BMI. Physical Exam Vitals and nursing note reviewed.  Constitutional:      Appearance: Normal appearance. He is well-developed.  HENT:     Head: Normocephalic and atraumatic.     Right Ear: Tympanic membrane and external ear normal.     Left Ear: Tympanic membrane and external ear normal.     Nose: Nose normal.     Mouth/Throat:     Mouth: Mucous membranes are moist.      Pharynx: Oropharynx is clear.  Eyes:     Conjunctiva/sclera: Conjunctivae normal.  Cardiovascular:     Rate and Rhythm: Normal rate and regular rhythm.     Heart sounds: Normal heart sounds. No murmur heard. Pulmonary:     Effort: Pulmonary effort is normal.     Breath sounds: Normal breath sounds. No wheezing, rhonchi or rales.  Musculoskeletal:     Cervical back: Neck supple.  Skin:    General: Skin is warm.     Findings: No rash.  Neurological:     Mental Status: He is alert and oriented to person, place, and time.  Psychiatric:        Behavior: Behavior normal.    Previous notes and tests were reviewed. The plan was reviewed with the patient/family, and all questions/concerned were addressed.  It was my pleasure to see Steven [name] today and participate in his care. Please feel free to contact me with any questions or concerns.  Sincerely,  Rexene Alberts, DO Allergy & Immunology  [facility] office: [place] office: [phone]

## 2022-09-20 NOTE — Telephone Encounter (Signed)
Pt called stating he needed schedule labs to check potassium again. Can lab orders be put in? Call back # [phone]

## 2022-09-21 ENCOUNTER — Ambulatory Visit: Payer: BC Managed Care – PPO | Admitting: Allergy

## 2022-09-21 NOTE — Telephone Encounter (Signed)
Lab orders placed and patient was instructed to call to schedule lab appt.

## 2022-09-22 ENCOUNTER — Emergency Department (HOSPITAL_COMMUNITY): Payer: BC Managed Care – PPO

## 2022-09-22 ENCOUNTER — Telehealth: Payer: Self-pay | Admitting: Primary Care

## 2022-09-22 ENCOUNTER — Other Ambulatory Visit: Payer: Self-pay

## 2022-09-22 ENCOUNTER — Encounter (HOSPITAL_COMMUNITY): Payer: Self-pay

## 2022-09-22 ENCOUNTER — Emergency Department (HOSPITAL_COMMUNITY)
Admission: EM | Admit: 2022-09-22 | Discharge: 2022-09-22 | Disposition: A | Discharge status: Home / Self Care | Payer: BC Managed Care – PPO | Attending: Emergency Medicine | Admitting: Emergency Medicine

## 2022-09-22 DIAGNOSIS — R42 Dizziness and giddiness: Secondary | ICD-10-CM | POA: Diagnosis not present

## 2022-09-22 DIAGNOSIS — R739 Hyperglycemia, unspecified: Secondary | ICD-10-CM | POA: Insufficient documentation

## 2022-09-22 DIAGNOSIS — R059 Cough, unspecified: Secondary | ICD-10-CM | POA: Diagnosis not present

## 2022-09-22 DIAGNOSIS — R61 Generalized hyperhidrosis: Secondary | ICD-10-CM | POA: Diagnosis not present

## 2022-09-22 DIAGNOSIS — R052 Subacute cough: Secondary | ICD-10-CM | POA: Insufficient documentation

## 2022-09-22 DIAGNOSIS — Z1152 Encounter for screening for COVID-19: Secondary | ICD-10-CM | POA: Insufficient documentation

## 2022-09-22 DIAGNOSIS — R0602 Shortness of breath: Secondary | ICD-10-CM | POA: Diagnosis not present

## 2022-09-22 DIAGNOSIS — E1165 Type 2 diabetes mellitus with hyperglycemia: Secondary | ICD-10-CM | POA: Diagnosis not present

## 2022-09-22 LAB — CBC WITH DIFFERENTIAL/PLATELET
Abs Immature Granulocytes: 0.06 10*3/uL (ref 0.00–0.07)
Basophils Absolute: 0 10*3/uL (ref 0.0–0.1)
Basophils Relative: 1 %
Eosinophils Absolute: 0.1 10*3/uL (ref 0.0–0.5)
Eosinophils Relative: 2 %
HCT: 51.2 % (ref 39.0–52.0)
Hemoglobin: 16.3 g/dL (ref 13.0–17.0)
Immature Granulocytes: 1 %
Lymphocytes Relative: 40 %
Lymphs Abs: 2.1 10*3/uL (ref 0.7–4.0)
MCH: 25.8 pg — ABNORMAL LOW (ref 26.0–34.0)
MCHC: 31.8 g/dL (ref 30.0–36.0)
MCV: 81.1 fL (ref 80.0–100.0)
Monocytes Absolute: 0.4 10*3/uL (ref 0.1–1.0)
Monocytes Relative: 8 %
Neutro Abs: 2.6 10*3/uL (ref 1.7–7.7)
Neutrophils Relative %: 48 %
Platelets: 275 10*3/uL (ref 150–400)
RBC: 6.31 MIL/uL — ABNORMAL HIGH (ref 4.22–5.81)
RDW: 13 % (ref 11.5–15.5)
WBC: 5.3 10*3/uL (ref 4.0–10.5)
nRBC: 0 % (ref 0.0–0.2)

## 2022-09-22 LAB — COMPREHENSIVE METABOLIC PANEL
ALT: 32 U/L (ref 0–44)
AST: 28 U/L (ref 15–41)
Albumin: 4.3 g/dL (ref 3.5–5.0)
Alkaline Phosphatase: 52 U/L (ref 38–126)
Anion gap: 13 (ref 5–15)
BUN: 11 mg/dL (ref 6–20)
CO2: 28 mmol/L (ref 22–32)
Calcium: 9.3 mg/dL (ref 8.9–10.3)
Chloride: 97 mmol/L — ABNORMAL LOW (ref 98–111)
Creatinine, Ser: 1.46 mg/dL — ABNORMAL HIGH (ref 0.61–1.24)
GFR, Estimated: >60 mL/min (ref >60)
Glucose, Bld: 342 mg/dL — ABNORMAL HIGH (ref 70–99)
Potassium: 2.9 mmol/L — ABNORMAL LOW (ref 3.5–5.1)
Sodium: 138 mmol/L (ref 135–145)
Total Bilirubin: 1.3 mg/dL — ABNORMAL HIGH (ref 0.3–1.2)
Total Protein: 7.7 g/dL (ref 6.5–8.1)

## 2022-09-22 LAB — RESP PANEL BY RT-PCR (RSV, FLU A&B, COVID)  RVPGX2
Influenza A by PCR: NEGATIVE
Influenza B by PCR: NEGATIVE
Resp Syncytial Virus by PCR: NEGATIVE
SARS Coronavirus 2 by RT PCR: NEGATIVE

## 2022-09-22 LAB — LIPASE, BLOOD: Lipase: 46 U/L (ref 11–51)

## 2022-09-22 LAB — MAGNESIUM: Magnesium: 1.7 mg/dL (ref 1.7–2.4)

## 2022-09-22 LAB — CBG MONITORING, ED
Glucose-Capillary: 356 mg/dL — ABNORMAL HIGH (ref 70–99)
Glucose-Capillary: 314 mg/dL — ABNORMAL HIGH (ref 70–99)

## 2022-09-22 MED ORDER — POTASSIUM CHLORIDE CRYS ER 20 MEQ PO TBCR
60.0000 meq | EXTENDED_RELEASE_TABLET | Freq: Once | ORAL | Status: AC
  Administered 2022-09-22: 60 meq via ORAL
  Filled 2022-09-22: qty 3

## 2022-09-22 MED ORDER — LACTATED RINGERS IV BOLUS
1000.0000 mL | Freq: Once | INTRAVENOUS | Status: AC
  Administered 2022-09-22: 1000 mL via INTRAVENOUS

## 2022-09-22 MED ORDER — HYDROCODONE BIT-HOMATROP MBR 5-1.5 MG/5ML PO SOLN: 5.0000 mL | Freq: Four times a day (QID) | ORAL | 0 refills | Status: DC | PRN

## 2022-09-22 NOTE — Telephone Encounter (Signed)
Form has been placed in [place] box for review. Anda Kraft was notified via [employer] thread.

## 2022-09-22 NOTE — Discharge Instructions (Signed)
Your workup today was overall reassuring.  No evidence of pneumonia.  Your blood sugars did improve after fluids to about low 300s.  Continue taking your home medications.  Follow-up with your primary care doctor to see if your diabetic regimen needs adjusting.  For any concerning symptoms return to the emergency room.  I have sent cough medicine into your pharmacy.

## 2022-09-22 NOTE — Telephone Encounter (Signed)
Steven [name], please call and get some clarification on what is needed. It sounds like intermittent FMLA but also continuous for dates of work he's missed?  What is the starting date?

## 2022-09-22 NOTE — ED Triage Notes (Signed)
Pt c/o of blood sugar issues.BS levels have been in the 300's for 4 days, sweatiness,dizzy spells, blurriness in your left eye. Today it spiked to the upper 400's but pt does state that he is currently on prednisone for a upper respiratory infection and this may be the cause.

## 2022-09-22 NOTE — Telephone Encounter (Signed)
Received paperwork that patient dropped off, the completed intermittent FMLA form, and a blank one.  Copies made of original forms dropped off and printed.  Also received through fax a disability and leave provider statement. Folder placed in your inbox for review and completion.

## 2022-09-22 NOTE — Telephone Encounter (Signed)
Patient came by and dropped off work accomodation forms for Steven [name] to update forms were left in her folder up front. He was advised of her being out on vacation.

## 2022-09-22 NOTE — ED Provider Notes (Signed)
[facility] Provider Note   CSN: [id number] Arrival date & time: 09/22/22  1343     History  Chief Complaint  Patient presents with   Blood Sugar Problem    Karnvir Cejas is a 42 y.o. male.  42 year old male presents today for evaluation of concern for elevated blood sugar over the past week.  He states he has been taking prednisone course for an upper respiratory infection.  He is compliant with his metformin and glipizide.  States he takes 1000 mg of metformin twice daily.  No insulin.  Otherwise eating and drinking okay.  The history is provided by the patient. No language interpreter was used.       Home Medications Prior to Admission medications   Medication Sig Start Date End Date Taking? Authorizing Provider  abacavir-dolutegravir-lamiVUDine (TRIUMEQ) 600-50-300 MG tablet Take 1 tablet by mouth daily. 07/28/22   Thayer Headings, MD  albuterol (VENTOLIN HFA) 108 (90 Base) MCG/ACT inhaler Inhale 2 puffs every 4-6 hours as as needed for cough, wheeze, tightness in chest, or shortness of breath 08/03/22   Althea Charon, FNP  amLODipine (NORVASC) 10 MG tablet TAKE 1 TABLET(10 MG) BY MOUTH DAILY FOR BLOOD PRESSURE 07/25/22   Pleas [name], NP  Blood Glucose Monitoring Suppl (ONETOUCH VERIO) w/Device KIT USE AS DIRECTED TO TEST UP TO FOUR TIMES DAILY 02/19/21   [provider]  ciclopirox (PENLAC) 8 % solution APPLY 1 COAT TO TOENAIL EVERY DAY FOR 48 WEEKS. REMOVE WEEKLY WITH POLISH REMOVER 06/22/21   Marzetta Board, DPM  EPINEPHrine 0.3 mg/0.3 mL IJ SOAJ injection INJECT 0.3 MG IN THE MUSCLE AS NEEDED FOR ANAPHYLAXIS 08/03/22   Althea Charon, FNP  esomeprazole (NEXIUM) 40 MG capsule TAKE ONE CAPSULE BY MOUTH ONCE DAILY AS DIRECTED Patient taking differently: Take 40 mg by mouth daily. 02/06/17   Kozlow, Donnamarie Poag, MD  famotidine (PEPCID) 20 MG tablet Take 1 tablet (20 mg total) by mouth daily for 14 days. Patient not  taking: Reported on 08/23/2022 08/12/22 08/26/22  Talbot Grumbling, FNP  fexofenadine (ALLEGRA) 180 MG tablet Take 1 tablet (180 mg total) by mouth daily. 08/03/22   Althea Charon, FNP  fluticasone Asencion Islam) 50 MCG/ACT nasal spray Use 1 to 2 sprays in each nostril once a day as needed for stuffy nose 08/03/22   Althea Charon, FNP  fluticasone-salmeterol (ADVAIR HFA) [phone] MCG/ACT inhaler INHALE 2 PUFFS BY MOUTH TWICE DAILY 08/03/22   Althea Charon, FNP  glipiZIDE (GLUCOTROL XL) 5 MG 24 hr tablet TAKE 1 TABLET(5 MG) BY MOUTH DAILY WITH BREAKFAST FOR DIABETES 07/29/22   Pleas [name], NP  glucose blood (ONETOUCH ULTRA) test strip USE AS DIRECTED UP TO FOUR TIMES DAILY. 02/20/22   Pleas [name], NP  glucose blood test strip Use as instructed to test up to 4 times a day DX E11.9 01/22/21   Pleas [name], NP  hydrochlorothiazide (HYDRODIURIL) 25 MG tablet TAKE 1 TABLET(25 MG) BY MOUTH DAILY FOR BLOOD PRESSURE 07/03/22   Pleas [name], NP  ibuprofen (ADVIL) 800 MG tablet Take 800 mg by mouth 3 (three) times daily as needed. 01/20/21   [provider]  LANCETS MICRO THIN 33G MISC USE TO TEST BLOOD SUGAR ONCE A DAY 08/21/17   Tresa Garter, MD  levothyroxine (SYNTHROID) 150 MCG tablet TAKE 1 TABLET(150 MCG) BY MOUTH DAILY 08/25/22   Elayne Snare, MD  metFORMIN (GLUCOPHAGE-XR) 500 MG 24 hr tablet TAKE 2 TABLETS  BY MOUTH IN THE MORNING AND IN THE EVENING FOR DIABETES 07/25/22   Pleas [name], NP  montelukast (SINGULAIR) 10 MG tablet TAKE 1 TABLET(10 MG) BY MOUTH DAILY 08/03/22   Althea Charon, FNP  Multiple Vitamin (MULTIVITAMIN) tablet Take 1 tablet by mouth daily.    [provider]  olopatadine (PATANOL) 0.1 % ophthalmic solution Place 1 drop into both eyes 2 (two) times daily. 08/12/22   Talbot Grumbling, FNP  ONE TOUCH ULTRA TEST test strip TEST BLOOD GLUCOSE THREE TIMES DAILY AS DIRECTED 10/12/18   Pleas [name], NP  potassium chloride SA (KLOR-CON M)  20 MEQ tablet Take 2 tablets (40 mEq total) by mouth daily. 08/26/22   Pleas [name], NP  predniSONE (DELTASONE) 10 MG tablet Take 2 tablets (20 mg total) by mouth daily. 09/10/22   Vevelyn Francois, NP  rosuvastatin (CRESTOR) 5 MG tablet TAKE 1 TABLET BY MOUTH EVERY DAY FOR CHOLESTEROL 07/29/22   Pleas [name], NP      Allergies    Lisinopril, Penicillins, Levaquin [levofloxacin in d5w], Tessalon [benzonatate], and Aspirin    Review of Systems   Review of Systems  Constitutional:  Negative for chills and fever.  Respiratory:  Negative for shortness of breath.   Cardiovascular:  Negative for chest pain.  Gastrointestinal:  Negative for abdominal pain.  Genitourinary:  Positive for frequency. Negative for dysuria.  Neurological:  Negative for light-headedness and headaches.  All other systems reviewed and are negative.   Physical Exam Updated Vital Signs BP 116/84 (BP Location: Right Arm)   Pulse 93   Temp 98.6 F (37 C)   Resp 17   Ht '6\' 2"'$  (1.88 m)   Wt 130.6 kg   SpO2 95%   BMI 36.97 kg/m  Physical Exam Vitals and nursing note reviewed.  Constitutional:      General: He is not in acute distress.    Appearance: Normal appearance. He is not ill-appearing.  HENT:     Head: Normocephalic and atraumatic.     Nose: Nose normal.  Eyes:     General: No scleral icterus.    Extraocular Movements: Extraocular movements intact.     Conjunctiva/sclera: Conjunctivae normal.  Cardiovascular:     Rate and Rhythm: Normal rate and regular rhythm.     Pulses: Normal pulses.  Pulmonary:     Effort: Pulmonary effort is normal. No respiratory distress.     Breath sounds: Normal breath sounds. No wheezing or rales.  Abdominal:     General: There is no distension.     Tenderness: There is no abdominal tenderness.  Musculoskeletal:        General: Normal range of motion.     Cervical back: Normal range of motion.  Skin:    General: Skin is warm and dry.  Neurological:      General: No focal deficit present.     Mental Status: He is alert. Mental status is at baseline.     ED Results / Procedures / Treatments   Labs (all labs ordered are listed, but only abnormal results are displayed) Labs Reviewed  CBC WITH DIFFERENTIAL/PLATELET - Abnormal; Notable for the following components:      Result Value   RBC 6.31 (*)    MCH 25.8 (*)    All other components within normal limits  COMPREHENSIVE METABOLIC PANEL - Abnormal; Notable for the following components:   Potassium 2.9 (*)    Chloride 97 (*)    Glucose, Bld  342 (*)    Creatinine, Ser 1.46 (*)    Total Bilirubin 1.3 (*)    All other components within normal limits  CBG MONITORING, ED - Abnormal; Notable for the following components:   Glucose-Capillary 356 (*)    All other components within normal limits  RESP PANEL BY RT-PCR (RSV, FLU A&B, COVID)  RVPGX2  LIPASE, BLOOD  MAGNESIUM    EKG None  Radiology DG Chest 2 View  Result Date: 09/22/2022 CLINICAL DATA:  Shortness of breath, hyperglycemia, dizziness, diaphoresis, blurred vision LEFT eye EXAM: CHEST - 2 VIEW COMPARISON:  05/19/2022 FINDINGS: Upper normal heart size. Mediastinal contours and pulmonary vascularity normal. Lungs clear. No pulmonary infiltrate, pleural effusion, or pneumothorax. Osseous structures unremarkable. IMPRESSION: No acute abnormalities. Electronically Signed   By: Lavonia Dana M.D.   On: 09/22/2022 15:03    Procedures Procedures    Medications Ordered in ED Medications  potassium chloride SA (KLOR-CON M) CR tablet 60 mEq (60 mEq Oral Given 09/22/22 1926)  lactated ringers bolus 1,000 mL (1,000 mLs Intravenous New Bag/Given 09/22/22 2019)    ED Course/ Medical Decision Making/ A&P                             Medical Decision Making Risk Prescription drug management.   42 year old male presents today for evaluation of elevated blood sugars, and persistent cough.  CBC reveals no leukocytosis, without anemia.  CMP  shows potassium of 2.9.  60 mEq of potassium was given orally.  Creatinine 1.46, glucose of 342.  Initial glucose in the 350s.  Improved to 314 after fluid resuscitation.  Lipase of 46, magnesium 1.7.  Without an anion gap.  No signs of DKA.  Discussed continuing home antihyperglycemic's and following up with PCP to determine if changes need to be made.  Medication to give patient insulin to reduce blood sugar.  COVID and flu negative.  Chest x-ray without acute cardiopulmonary process.  Patient voices understanding and is in agreement with plan.  Final Clinical Impression(s) / ED Diagnoses Final diagnoses:  Hyperglycemia  Subacute cough    Rx / DC Orders ED Discharge Orders          Ordered    HYDROcodone bit-homatropine (HYCODAN) 5-1.5 MG/5ML syrup  Every 6 hours PRN        09/22/22 2247              Evlyn Courier, PA-C 09/22/22 2310    Elgie Congo, MD 09/22/22 2325

## 2022-09-23 ENCOUNTER — Encounter: Payer: Self-pay | Admitting: Podiatry

## 2022-09-23 NOTE — Telephone Encounter (Signed)
FMLA for Patient forms received for completion for patient. Patient has been informed that process may take up to 5 business days.  Employer Name: [employer]  Any Planned appointment? None on file Patient is requesting continuous FMLA for the dates listed:  Feb 2-Feb 5, Feb 6-8, Feb 14-17, March 2-11, March 14-16  Verified with patient that it is ok to leave Voicemail updates on  Mobile [phone] (mobile)  Patient would like to pick up copy in our office when form is completed.   Fax number form should be sent to is [phone]    Patient is contacting his HR department regarding what exactly we need to fill out for him. We received disability paperwork from them directly, and the patient dropped of FMLA ppw to be revised and submitted again. Patient advised he will call back with some clarification form them.

## 2022-09-23 NOTE — Telephone Encounter (Signed)
Unable to reach patient. Left voicemail to return call to our office.   

## 2022-09-26 ENCOUNTER — Other Ambulatory Visit (INDEPENDENT_AMBULATORY_CARE_PROVIDER_SITE_OTHER): Payer: BC Managed Care – PPO

## 2022-09-26 ENCOUNTER — Telehealth: Payer: Self-pay

## 2022-09-26 DIAGNOSIS — E876 Hypokalemia: Secondary | ICD-10-CM | POA: Diagnosis not present

## 2022-09-26 LAB — POTASSIUM: Potassium: 3.3 mEq/L — ABNORMAL LOW (ref 3.5–5.1)

## 2022-09-26 NOTE — Transitions of Care (Post Inpatient/ED Visit) (Unsigned)
   09/26/2022  Name: Steven [name] MRN: [id number] DOB: July 24, 1980  Today's TOC FU Call Status: Today's TOC FU Call Status:: Unsuccessul Call (1st Attempt) Unsuccessful Call (1st Attempt) Date: 09/26/22  Attempted to reach the patient regarding the most recent Inpatient/ED visit.  Follow Up Plan: Additional outreach attempts will be made to reach the patient to complete the Transitions of Care (Post Inpatient/ED visit) call.   [place] LPN [place] Advisor Direct Dial [phone]

## 2022-09-27 NOTE — Telephone Encounter (Signed)
Faxed off competed form.  Advised patient I have made copy and placed up front for him to pickup.  He is contacting is HR department to see if there is anything else we need to complete regarding the disability form they faxed to [place].

## 2022-09-27 NOTE — Transitions of Care (Post Inpatient/ED Visit) (Signed)
   09/27/2022  Name: Steven [name] MRN: [id number] DOB: 03/22/1981  Today's TOC FU Call Status: Today's TOC FU Call Status:: Successful TOC FU Call Competed Unsuccessful Call (1st Attempt) Date: 09/26/22 [facility] FU Call Complete Date: 09/27/22  Transition Care Management Follow-up Telephone Call Date of Discharge: 09/22/22 [facility] Type of Discharge: Emergency Department Reason for ED Visit: Other: (hyerglycemia) How have you been since you were released from the hospital?: Better Any questions or concerns?: No  Items Reviewed: Did you receive and understand the discharge instructions provided?: Yes Medications obtained and verified?: Yes (Medications Reviewed) Any new allergies since your discharge?: No Dietary orders reviewed?: Yes Do you have support at home?: Yes  Home Care and Equipment/Supplies: [place]?: No Any new equipment or medical supplies ordered?: No  Functional Questionnaire: Do you need assistance with bathing/showering or dressing?: No Do you need assistance with meal preparation?: No Do you need assistance with eating?: No Do you have difficulty maintaining continence: No Do you need assistance with getting out of bed/getting out of a chair/moving?: No Do you have difficulty managing or taking your medications?: No  Follow up appointments reviewed: PCP Follow-up appointment confirmed?: No (pt needs to be seen by 10-06-22- no appts available- will ask front desk to call pt to schedule fu appt) MD Provider Line Number:[phone] Given: Yes Follow-up Provider: Alma Friendly NP [facility] Follow-up appointment confirmed?: No Do you need transportation to your follow-up appointment?: No Do you understand care options if your condition(s) worsen?: Yes-patient verbalized understanding    [place] LPN [place] [phone]

## 2022-09-27 NOTE — Telephone Encounter (Signed)
Completed and placed in kelli's inbox.

## 2022-09-29 ENCOUNTER — Telehealth: Payer: Self-pay | Admitting: Primary Care

## 2022-09-29 NOTE — Telephone Encounter (Signed)
Steven [name] from [employer] called in stating that she received a completed questionnaire,but it is insufficient, she needs clarity on what is the recommendation,due to the patient being out of work since February 2,2024.

## 2022-09-29 NOTE — Telephone Encounter (Signed)
Spoke with Starlette from [employer] she stated that she needs the disability accomodation form that we have to be refilled out with the request for continuous FMLA (for the dates patient was seen at ED) and for the intermittent FMLA. This can be filled out on the same form.   Patient is requesting the intermittent FMLA for  - 80 hrs per month - for his allergic reactions that result in facial swelling, and irritation (seems to be various triggers in the environment, possible trigger at work is fragrance, specific source unknown)  -He has had to leave work on multiple occasions with the onset of an allergic reaction that has obviously been caused by something in his environment - He stated that if the environment could be altered and isolation could be accommodated to a certain degree could be provided this could help, considering work from home - Prednisone has been given for his allergic reactions in the past and states this makes his blood sugars elevated and dizziness occurs. Medication side effects upon treatment for the allergic reactions can be included on form - The form needs to be filled out in more detail related to his specific medical needs for the time off he is requesting.  Morrell Riddle stated based on how the form reads right now they don't see a reason for him not being able to perform his job functions adequately. She just asked for more specifics

## 2022-09-30 ENCOUNTER — Ambulatory Visit: Payer: BC Managed Care – PPO | Admitting: Podiatry

## 2022-09-30 DIAGNOSIS — Z8639 Personal history of other endocrine, nutritional and metabolic disease: Secondary | ICD-10-CM | POA: Diagnosis not present

## 2022-09-30 DIAGNOSIS — T7840XA Allergy, unspecified, initial encounter: Secondary | ICD-10-CM | POA: Diagnosis not present

## 2022-10-04 ENCOUNTER — Encounter: Payer: Self-pay | Admitting: Primary Care

## 2022-10-04 ENCOUNTER — Ambulatory Visit (INDEPENDENT_AMBULATORY_CARE_PROVIDER_SITE_OTHER): Payer: BC Managed Care – PPO | Admitting: Primary Care

## 2022-10-04 VITALS — BP 132/84 | HR 91 | Temp 98.2°F | Ht 74.0 in | Wt 279.0 lb

## 2022-10-04 DIAGNOSIS — I10 Essential (primary) hypertension: Secondary | ICD-10-CM

## 2022-10-04 DIAGNOSIS — T7840XS Allergy, unspecified, sequela: Secondary | ICD-10-CM | POA: Diagnosis not present

## 2022-10-04 DIAGNOSIS — E876 Hypokalemia: Secondary | ICD-10-CM

## 2022-10-04 MED ORDER — SPIRONOLACTONE 25 MG PO TABS: 25.0000 mg | ORAL_TABLET | Freq: Every day | ORAL | 0 refills | Status: DC

## 2022-10-04 NOTE — Assessment & Plan Note (Signed)
Controlled but continues with chronic hypokalemia.  Stop HCTZ 25 mg. Continue amlodipine 10 mg daily. Add spironolactone 25 mg daily.  Stop potassium chloride 20 mEq.   We will plan to see him in 2 weeks for BP check and BMP.

## 2022-10-04 NOTE — Assessment & Plan Note (Signed)
Recurrent since February 2024. Reviewed urgent care notes from September 30, 2022.  Will complete FMLA for work accomodation and intermittent FMLA as work does seem to be his trigger. I've asked that he set up a follow up visit with his allergist.

## 2022-10-04 NOTE — Telephone Encounter (Signed)
Completed and placed on Kelli's desk.

## 2022-10-04 NOTE — Patient Instructions (Addendum)
Start spironolactone 25 mg once daily for blood pressure.  Stop taking hydrochlorothiazide 25 mg daily for blood pressure.  Follow up with your allergist.  Please schedule a follow up visit to meet back with me in 2-3 weeks for blood pressure check.   It was a pleasure to see you today!

## 2022-10-04 NOTE — Assessment & Plan Note (Signed)
Likely secondary to HCTZ.  Will stop HCTZ 25 mg. Add spironolactone 25 mg daily. Hold potassium chloride for now.  Follow up in 2 weeks

## 2022-10-04 NOTE — Telephone Encounter (Signed)
Completed form faxed.

## 2022-10-04 NOTE — Progress Notes (Signed)
Subjective:    Patient ID: Steven [name], male    DOB: [birth year], 42 y.o.   MRN: [id number]  HPI  Steven [name] is a very pleasant 43 y.o. male with a history of hypertension, severe persistent asthma, allergic reaction, hypothyroidism, type 2 diabetes, HIV, CKD, hyperlipidemia, lymphedema, chronic hypokalemia who presents today for ED follow up, hypertension/hypokalemia follow up, and to discuss FMLA.   1) Allergic Reactions/FMLA: Multiple Urgent Care and ED visits for allergic reaction, hyperglycemia, dizziness since February 2024. His last visit was to Urgent Care on 09/30/22 for a 2 hour history of itching to the eyelids and eyes with swelling, itching to the hands. He was treated with OTC famotidine 10 mg every 12 hours x 5 days and IM Dexamethasone. Since his evaluation on 09/30/22 he's doing better. He has some left eye puffiness but overall feeling better.   He does follow with an allergist, last office visit was in January 2024, just prior to his recurrence of allergy reactions. Typical reactions include swelling to the tongue, eyes, under his nose with itching to hands and feet, and eyes. He denies changes in medications/supplements/diet. All of his allergic reactions have occurred at work.   He believes that his environment at work is a trigger for his reactions including colones, cleaning chemicals, perfume. 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 He believes that an accomodation can help such as an office or working from home.   2) Hypertension/Hypokalemia: Currently managed on HCTZ 25 mg daily and amlodipine 10 mg daily. History of chronic hypokalemia and is managed on potassium chloride 20 mEq BID, most recent potassium level was 3.3. He has a history of angioedema and facial allergic reactions so he cannot take ACE's or ARB's. He has not tried spironolactone.   Review of Systems  HENT:  Positive for facial swelling. Negative for trouble swallowing.    Eyes:  Positive for itching.       Left upper eyelid swelling  Respiratory:  Negative for shortness of breath and wheezing.   Cardiovascular:  Negative for chest pain.  Allergic/Immunologic: Positive for environmental allergies and food allergies.         Past Medical History:  Diagnosis Date   Asthma    Cancer ([place])    Colitis 05/27/2011   Diabetes mellitus without complication (HCC)    HIV positive ([place]) 03/23/09   Genotype [id number]   HTN (hypertension)    Kaposi's sarcoma    Syphilis 03/23/09-   1:2    Social History   Socioeconomic History   Marital status: Single    Spouse name: Not on file   Number of children: Not on file   Years of education: Not on file   Highest education level: Not on file  Occupational History   Not on file  Tobacco Use   Smoking status: Never   Smokeless tobacco: Never  Vaping Use   Vaping Use: Never used  Substance and Sexual Activity   Alcohol use: Yes    Alcohol/week: 0.0 standard drinks of alcohol    Comment: socially - less now   Drug use: No   Sexual activity: Not Currently    Partners: Male    Birth control/protection: Condom    Comment: pt. declined condoms  Other Topics Concern   Not on file  Social History Narrative   Single.    No children.   Works in Therapist, art   Enjoys DJ, traveling.    Social  Determinants of Health   Financial Resource Strain: Not on file  Food Insecurity: Not on file  Transportation Needs: Not on file  Physical Activity: Not on file  Stress: Not on file  Social Connections: Not on file  Intimate Partner Violence: Not on file    Past Surgical History:  Procedure Laterality Date   BRAIN SURGERY     IR GENERIC HISTORICAL  02/12/2016   IR REMOVAL TUN ACCESS W/ PORT W/O FL MOD SED 02/12/2016 Steven Daft, MD [facility]    Family History  Problem Relation Age of Onset   Pancreatic cancer Mother    Cancer Mother    Diabetes Paternal Grandmother    Thyroid disease Paternal Grandmother      Allergies  Allergen Reactions   Lisinopril Swelling    Swelling of lower lip while on lisinopril   Penicillins Hives and Swelling    REACTION: rash and swelling   Levaquin [Levofloxacin In D5w] Rash   Tessalon [Benzonatate] Rash   Aspirin Nausea Only    Current Outpatient Medications on File Prior to Visit  Medication Sig Dispense Refill   abacavir-dolutegravir-lamiVUDine (TRIUMEQ) 600-50-300 MG tablet Take 1 tablet by mouth daily. 30 tablet 11   albuterol (VENTOLIN HFA) 108 (90 Base) MCG/ACT inhaler Inhale 2 puffs every 4-6 hours as as needed for cough, wheeze, tightness in chest, or shortness of breath 18 g 1   amLODipine (NORVASC) 10 MG tablet TAKE 1 TABLET(10 MG) BY MOUTH DAILY FOR BLOOD PRESSURE 90 tablet 3   Blood Glucose Monitoring Suppl (ONETOUCH VERIO) w/Device KIT USE AS DIRECTED TO TEST UP TO FOUR TIMES DAILY     ciclopirox (PENLAC) 8 % solution APPLY 1 COAT TO TOENAIL EVERY DAY FOR 48 WEEKS. REMOVE WEEKLY WITH POLISH REMOVER 6.6 mL 11   EPINEPHrine 0.3 mg/0.3 mL IJ SOAJ injection INJECT 0.3 MG IN THE MUSCLE AS NEEDED FOR ANAPHYLAXIS 2 each 1   esomeprazole (NEXIUM) 40 MG capsule TAKE ONE CAPSULE BY MOUTH ONCE DAILY AS DIRECTED (Patient taking differently: Take 40 mg by mouth daily.) 30 capsule 0   fexofenadine (ALLEGRA) 180 MG tablet Take 1 tablet (180 mg total) by mouth daily. 15 tablet 0   fluticasone (FLONASE) 50 MCG/ACT nasal spray Use 1 to 2 sprays in each nostril once a day as needed for stuffy nose 16 g 5   fluticasone-salmeterol (ADVAIR HFA) 230-21 MCG/ACT inhaler INHALE 2 PUFFS BY MOUTH TWICE DAILY 12 g 5   glipiZIDE (GLUCOTROL XL) 5 MG 24 hr tablet TAKE 1 TABLET(5 MG) BY MOUTH DAILY WITH BREAKFAST FOR DIABETES 90 tablet 0   glucose blood (ONETOUCH ULTRA) test strip USE AS DIRECTED UP TO FOUR TIMES DAILY. 300 strip 3   glucose blood test strip Use as instructed to test up to 4 times a day DX E11.9 100 each 3   ibuprofen (ADVIL) 800 MG tablet Take 800 mg by mouth 3  (three) times daily as needed.     LANCETS MICRO THIN 33G MISC USE TO TEST BLOOD SUGAR ONCE A DAY 100 each 2   levothyroxine (SYNTHROID) 150 MCG tablet TAKE 1 TABLET(150 MCG) BY MOUTH DAILY 90 tablet 1   metFORMIN (GLUCOPHAGE-XR) 500 MG 24 hr tablet TAKE 2 TABLETS BY MOUTH IN THE MORNING AND IN THE EVENING FOR DIABETES 360 tablet 1   montelukast (SINGULAIR) 10 MG tablet TAKE 1 TABLET(10 MG) BY MOUTH DAILY 90 tablet 1   Multiple Vitamin (MULTIVITAMIN) tablet Take 1 tablet by mouth daily.  olopatadine (PATANOL) 0.1 % ophthalmic solution Place 1 drop into both eyes 2 (two) times daily. 5 mL 0   ONE TOUCH ULTRA TEST test strip TEST BLOOD GLUCOSE THREE TIMES DAILY AS DIRECTED 100 each 2   potassium chloride SA (KLOR-CON M) 20 MEQ tablet Take 2 tablets (40 mEq total) by mouth daily. 180 tablet 3   rosuvastatin (CRESTOR) 5 MG tablet TAKE 1 TABLET BY MOUTH EVERY DAY FOR CHOLESTEROL 90 tablet 2   TRELEGY ELLIPTA 200-62.5-25 MCG/ACT AEPB Inhale 1 puff into the lungs daily.     famotidine (PEPCID) 20 MG tablet Take 1 tablet (20 mg total) by mouth daily for 14 days. (Patient not taking: Reported on 08/23/2022) 30 tablet 0   HYDROcodone bit-homatropine (HYCODAN) 5-1.5 MG/5ML syrup Take 5 mLs by mouth every 6 (six) hours as needed for cough. (Patient not taking: Reported on 09/27/2022) 120 mL 0   predniSONE (DELTASONE) 10 MG tablet Take 2 tablets (20 mg total) by mouth daily. (Patient not taking: Reported on 09/27/2022) 15 tablet 0   Current Facility-Administered Medications on File Prior to Visit  Medication Dose Route Frequency Provider Last Rate Last Admin   omalizumab Arvid Right) injection 300 mg  300 mg Subcutaneous Q14 Days Rexene Alberts M, DO   300 mg at 06/13/22 1038    BP 132/84   Pulse 91   Temp 98.2 F (36.8 C) (Temporal)   Ht 6\' 2"  (1.88 m)   Wt 279 lb (126.6 kg)   SpO2 96%   BMI 35.82 kg/m  Objective:   Physical Exam Eyes:     Comments: Mild swelling to left upper eyelid.  Cardiovascular:      Rate and Rhythm: Normal rate and regular rhythm.  Pulmonary:     Effort: Pulmonary effort is normal.     Breath sounds: Normal breath sounds. No wheezing or rales.  Musculoskeletal:     Cervical back: Neck supple.  Skin:    General: Skin is warm and dry.  Neurological:     Mental Status: He is alert and oriented to person, place, and time.           Assessment & Plan:  Essential hypertension Assessment & Plan: Controlled but continues with chronic hypokalemia.  Stop HCTZ 25 mg. Continue amlodipine 10 mg daily. Add spironolactone 25 mg daily.  Stop potassium chloride 20 mEq.   We will plan to see him in 2 weeks for BP check and BMP.  Orders: -     Spironolactone; Take 1 tablet (25 mg total) by mouth daily. for blood pressure.  Dispense: 30 tablet; Refill: 0  Allergic reaction, sequela Assessment & Plan: Recurrent since February 2024. Reviewed urgent care notes from September 30, 2022.  Will complete FMLA for work accomodation and intermittent FMLA as work does seem to be his trigger. I've asked that he set up a follow up visit with his allergist.     Hypokalemia Assessment & Plan: Likely secondary to HCTZ.  Will stop HCTZ 25 mg. Add spironolactone 25 mg daily. Hold potassium chloride for now.  Follow up in 2 weeks         Pleas [name], NP

## 2022-10-08 DIAGNOSIS — T7840XA Allergy, unspecified, initial encounter: Secondary | ICD-10-CM | POA: Diagnosis not present

## 2022-10-08 DIAGNOSIS — R600 Localized edema: Secondary | ICD-10-CM | POA: Diagnosis not present

## 2022-10-11 NOTE — Patient Instructions (Incomplete)
Asthma Continue montelukast 10 mg once a day to prevent cough or wheeze Continue Advair 230-2 puffs twice a day with a spacer to prevent cough or wheeze Continue albuterol 2 puffs once every 4 hours as needed for cough or wheeze You may use albuterol 2 puffs 5 to 15 minutes before activity to decrease cough or wheeze Continue Xolair injections 300 mg once every 2 weeks to help control your asthma   Allergic rhinitis Continue allergen avoidance measures directed toward grass pollen, weed pollen, mold, dust mite, cat, and dog as listed below Continue Allegra 180 mg once a day as needed for runny nose or itch Continue Flonase 2 sprays in each nostril once a day as needed for stuffy nose.  In the right nostril, point the applicator out toward the right ear. In the left nostril, point the applicator out toward the left ear Consider saline nasal rinses as needed for nasal symptoms. Use this before any medicated nasal sprays for best result   Allergic conjunctivitis Continue olopatadine 1 drop in each eye once a day as needed for red or itchy eyes   Reflux Continue dietary and lifestyle modifications as listed below Continue Nexium 40 mg once a day as needed for reflux   Food allergy Continue to avoid shellfish and mushroom. In case of an allergic reaction, take Benadryl 50 mg every 4 hours, and if life-threatening symptoms occur, inject with EpiPen 0.3 mg.  Allergic reaction If your symptoms re-occur, begin a journal of events that occurred for up to 6 hours before your symptoms began including foods and beverages consumed, soaps or perfumes you had contact with, and medications.  We will order some labs to look for causes of your reactions. We will call you with results once they are all back.  Your blood pressure was elevated while in the office today.  Please make an appointment with your primary care physician to discuss Call the clinic if this treatment plan is not working well for you.    Follow up in 6 weeks with Dr. Maudie Mercury or sooner if needed.

## 2022-10-11 NOTE — Progress Notes (Signed)
   [place] [address] Dept: [phone]  FOLLOW UP NOTE  Patient ID: Steven [name], male    DOB: 12-30-1980  Age: 42 y.o. MRN: [id number] Date of Office Visit: 10/12/2022  Assessment  Chief Complaint: No chief complaint on file.  HPI Amandeep Jonak is a 42 year old male who presents to the clinic for a follow-up visit.  He was last seen in this clinic on 08/03/2022 by Althea Charon, FNP, for evaluation of asthma, allergic rhinitis, allergic conjunctivitis, reflux, food allergy, and idiopathic reactions.  His problem list is significant for HIV and hyperglycemia.  Chart review indicates that he has had 4 allergic reactions over the last 2 months with 3 of these reactions requiring prednisone for resolution.  Also noted that he visited the emergency department for hyper glycemia with blood sugar over 350.   Drug Allergies:  Allergies  Allergen Reactions   Lisinopril Swelling    Swelling of lower lip while on lisinopril   Penicillins Hives and Swelling    REACTION: rash and swelling   Levaquin [Levofloxacin In D5w] Rash   Tessalon [Benzonatate] Rash   Aspirin Nausea Only    Physical Exam: There were no vitals taken for this visit.   Physical Exam  Diagnostics:    Assessment and Plan: No diagnosis found.  No orders of the defined types were placed in this encounter.   There are no Patient Instructions on file for this visit.  No follow-ups on file.    Thank you for the opportunity to care for this patient.  Please do not hesitate to contact me with questions.  Gareth Morgan, FNP [facility]

## 2022-10-12 ENCOUNTER — Other Ambulatory Visit: Payer: Self-pay

## 2022-10-12 ENCOUNTER — Ambulatory Visit (INDEPENDENT_AMBULATORY_CARE_PROVIDER_SITE_OTHER): Payer: BC Managed Care – PPO | Admitting: Family Medicine

## 2022-10-12 ENCOUNTER — Encounter: Payer: Self-pay | Admitting: Family Medicine

## 2022-10-12 VITALS — BP 134/90 | HR 91 | Temp 97.6°F | Resp 20 | Wt 277.9 lb

## 2022-10-12 DIAGNOSIS — T7840XD Allergy, unspecified, subsequent encounter: Secondary | ICD-10-CM

## 2022-10-12 DIAGNOSIS — J302 Other seasonal allergic rhinitis: Secondary | ICD-10-CM

## 2022-10-12 DIAGNOSIS — Z91018 Allergy to other foods: Secondary | ICD-10-CM | POA: Diagnosis not present

## 2022-10-12 DIAGNOSIS — B20 Human immunodeficiency virus [HIV] disease: Secondary | ICD-10-CM

## 2022-10-12 DIAGNOSIS — J3089 Other allergic rhinitis: Secondary | ICD-10-CM

## 2022-10-12 DIAGNOSIS — H1010 Acute atopic conjunctivitis, unspecified eye: Secondary | ICD-10-CM

## 2022-10-12 DIAGNOSIS — J4550 Severe persistent asthma, uncomplicated: Secondary | ICD-10-CM | POA: Diagnosis not present

## 2022-10-12 DIAGNOSIS — K219 Gastro-esophageal reflux disease without esophagitis: Secondary | ICD-10-CM | POA: Diagnosis not present

## 2022-10-12 NOTE — Progress Notes (Signed)
Immunotherapy   Patient Details  Name: Olga Norsworthy MRN: [id number] Date of Birth: 1980/07/16  10/12/2022  [name] restarted injections for  xolair 300mg  Following schedule: every 2 weeks   Patient received a sample in office on 10/12/22 and will contact Tammy to set up shipment.    Larence Penning 10/12/2022, 12:37 PM

## 2022-10-14 ENCOUNTER — Ambulatory Visit: Payer: BC Managed Care – PPO | Admitting: Allergy

## 2022-10-15 ENCOUNTER — Ambulatory Visit
Admission: EM | Admit: 2022-10-15 | Discharge: 2022-10-15 | Disposition: A | Discharge status: Home / Self Care | Payer: BC Managed Care – PPO | Attending: Internal Medicine | Admitting: Internal Medicine

## 2022-10-15 DIAGNOSIS — T7840XA Allergy, unspecified, initial encounter: Secondary | ICD-10-CM

## 2022-10-15 MED ORDER — PREDNISONE 20 MG PO TABS: 40.0000 mg | ORAL_TABLET | Freq: Every day | ORAL | 0 refills | Status: AC

## 2022-10-15 MED ORDER — DIPHENHYDRAMINE HCL 50 MG/ML IJ SOLN
50.0000 mg | Freq: Once | INTRAMUSCULAR | Status: AC
  Administered 2022-10-15: 50 mg via INTRAMUSCULAR

## 2022-10-15 MED ORDER — DEXAMETHASONE SODIUM PHOSPHATE 10 MG/ML IJ SOLN
10.0000 mg | Freq: Once | INTRAMUSCULAR | Status: AC
  Administered 2022-10-15: 10 mg via INTRAMUSCULAR

## 2022-10-15 NOTE — ED Triage Notes (Addendum)
[facility] with [facility] of allergic reaction, pt reports swelling to right side of face, tingling under tongue,  and itchiness all over for 20 min. Pt has not attempted any otc medications. Not sure what he could have been in contact with

## 2022-10-15 NOTE — ED Notes (Signed)
Checked on patient, pt reports itchiness has decreased a lot. Informed [place] NP

## 2022-10-15 NOTE — Discharge Instructions (Signed)
I have prescribed prednisone for you to start taking tomorrow.  Monitor blood glucose.  Go to the hospital if symptoms persist or worsen and use your EpiPen.

## 2022-10-15 NOTE — ED Provider Notes (Signed)
[facility]    CSN: [id number] Arrival date & time: 10/15/22  1522      History   Chief Complaint Chief Complaint  Patient presents with   Allergic Reaction    HPI Steven [name] is a 42 y.o. male.   Patient presents today with concerns of allergic reaction.  He reports that approximately 20 minutes ago he developed itchiness of his hands bilaterally, tongue tingling, facial swelling around the eyes and cheeks of face.  He states this is a recurrent problem for him as he is allergic to several different environmental triggers.  He states that he was driving to work when this occurred so is not sure exactly what he was exposed to.  Denies tongue swelling, feelings of throat closing, shortness of breath.  He does have an EpiPen but has not had to use it in quite some time.  He has not taken any medications for symptoms.   Allergic Reaction   Past Medical History:  Diagnosis Date   Asthma    Cancer    Colitis 05/27/2011   Diabetes mellitus without complication    HIV positive 03/23/09   Genotype Y181C   HTN (hypertension)    Kaposi's sarcoma    Syphilis 03/23/09-   1:2    Patient Active Problem List   Diagnosis Date Noted   Routine screening for STI (sexually transmitted infection) 07/28/2022   Sinusitis, acute frontal 07/28/2022   Chronic shoulder pain 12/16/2021   Hypokalemia 08/06/2021   Preoperative clearance 10/29/2020   Hyperlipidemia 05/28/2020   Preventative health care 08/10/2018   Severe persistent asthma without complication 06/15/2018   Allergic reaction 06/15/2018   Food allergy 06/15/2018   Seasonal and perennial allergic rhinoconjunctivitis 06/15/2018   HIV infection 02/09/2018   Gastroesophageal reflux disease 03/28/2017   Type 2 diabetes mellitus with hyperglycemia 03/25/2015   CKD (chronic kidney disease) stage 3, GFR 30-59 ml/min 03/23/2015   Thrush, oral 03/23/2015   Essential hypertension 11/24/2014   Hypothyroidism,  postradioiodine therapy 01/13/2014   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 Authorizing Provider  predniSONE (DELTASONE) 20 MG tablet Take 2 tablets (40 mg total) by mouth daily for 5 days. 10/15/22 10/20/22 Yes Kaleab Frasier, Acie Fredrickson, FNP  abacavir-dolutegravir-lamiVUDine (TRIUMEQ) 600-50-300 MG tablet Take 1 tablet by mouth daily. 07/28/22   Gardiner Barefoot, MD  albuterol (VENTOLIN HFA) 108 (90 Base) MCG/ACT inhaler Inhale 2 puffs every 4-6 hours as as needed for cough, wheeze, tightness in chest, or shortness of breath 08/03/22   Nehemiah Settle, FNP  amLODipine (NORVASC) 10 MG tablet TAKE 1 TABLET(10 MG) BY MOUTH DAILY FOR BLOOD PRESSURE 07/25/22   Doreene Nest, NP  Blood Glucose Monitoring Suppl (ONETOUCH VERIO) w/Device KIT USE AS DIRECTED TO TEST UP TO FOUR TIMES DAILY 02/19/21   [provider]  ciclopirox (PENLAC) 8 % solution APPLY 1 COAT TO TOENAIL EVERY DAY FOR 48 WEEKS. REMOVE WEEKLY WITH POLISH REMOVER 06/22/21   Freddie Breech, DPM  EPINEPHrine 0.3  mg/0.3 mL IJ SOAJ injection INJECT 0.3 MG IN THE MUSCLE AS NEEDED FOR ANAPHYLAXIS 08/03/22   Nehemiah Settle, FNP  esomeprazole (NEXIUM) 40 MG capsule TAKE ONE CAPSULE BY MOUTH ONCE DAILY AS DIRECTED Patient taking differently: Take 40 mg by mouth daily. 02/06/17   Kozlow, Alvira Philips, MD  famotidine (PEPCID) 20 MG tablet Take 1 tablet (20 mg total) by mouth daily for 14 days. Patient not taking: Reported on 08/23/2022 08/12/22 08/26/22  Carlisle Beers, FNP  fexofenadine (ALLEGRA) 180 MG  tablet Take 1 tablet (180 mg total) by mouth daily. 08/03/22   Nehemiah Settle, FNP  fluticasone Aleda Grana) 50 MCG/ACT nasal spray Use 1 to 2 sprays in each nostril once a day as needed for stuffy nose 08/03/22   Nehemiah Settle, FNP  fluticasone-salmeterol (ADVAIR HFA) [phone] MCG/ACT inhaler INHALE 2 PUFFS BY MOUTH TWICE DAILY 08/03/22   Nehemiah Settle, FNP  glipiZIDE (GLUCOTROL XL) 5 MG 24 hr tablet TAKE 1 TABLET(5 MG) BY MOUTH DAILY WITH BREAKFAST FOR DIABETES 07/29/22   Doreene Nest, NP  glucose blood (ONETOUCH ULTRA) test strip USE AS DIRECTED UP TO FOUR TIMES DAILY. 02/20/22   Doreene Nest, NP  glucose blood test strip Use as instructed to test up to 4 times a day DX E11.9 01/22/21   Doreene Nest, NP  ibuprofen (ADVIL) 800 MG tablet Take 800 mg by mouth 3 (three) times daily as needed. 01/20/21   [provider]  LANCETS MICRO THIN 33G MISC USE TO TEST BLOOD SUGAR ONCE A DAY 08/21/17   Quentin Angst, MD  levothyroxine (SYNTHROID) 150 MCG tablet TAKE 1 TABLET(150 MCG) BY MOUTH DAILY 08/25/22   Reather Littler, MD  metFORMIN (GLUCOPHAGE-XR) 500 MG 24 hr tablet TAKE 2 TABLETS BY MOUTH IN THE MORNING AND IN THE EVENING FOR DIABETES 07/25/22   Doreene Nest, NP  montelukast (SINGULAIR) 10 MG tablet TAKE 1 TABLET(10 MG) BY MOUTH DAILY 08/03/22   Nehemiah Settle, FNP  Multiple Vitamin (MULTIVITAMIN) tablet Take 1 tablet by mouth daily.    [provider]  olopatadine (PATANOL) 0.1 % ophthalmic solution Place 1 drop into both eyes 2 (two) times daily. 08/12/22   Carlisle Beers, FNP  ONE TOUCH ULTRA TEST test strip TEST BLOOD GLUCOSE THREE TIMES DAILY AS DIRECTED 10/12/18   Doreene Nest, NP  potassium chloride SA (KLOR-CON M) 20 MEQ tablet Take 2 tablets (40 mEq total) by mouth daily. 08/26/22   Doreene Nest, NP  rosuvastatin (CRESTOR) 5 MG tablet TAKE 1 TABLET BY MOUTH EVERY DAY FOR CHOLESTEROL 07/29/22   Doreene Nest, NP  spironolactone (ALDACTONE)  25 MG tablet Take 1 tablet (25 mg total) by mouth daily. for blood pressure. 10/04/22   Doreene Nest, NP  TRELEGY Salomon Mast 200-62.5-25 MCG/ACT AEPB Inhale 1 puff into the lungs daily. 09/14/22   [provider]    Family History Family History  Problem Relation Age of Onset   Pancreatic cancer Mother    Cancer Mother    Diabetes Paternal Grandmother    Thyroid disease Paternal Grandmother     Social History Social History   Tobacco Use   Smoking status: Never   Smokeless tobacco: Never  Vaping Use   Vaping Use: Never used  Substance Use Topics   Alcohol use: Yes    Alcohol/week: 0.0 standard drinks of alcohol    Comment: socially - less now   Drug use: No     Allergies   Lisinopril,  Penicillins, Levaquin [levofloxacin in d5w], Tessalon [benzonatate], and Aspirin   Review of Systems Review of Systems Per HPI  Physical Exam Triage Vital Signs ED Triage Vitals  Enc Vitals Group     BP 10/15/22 1530 138/87     Pulse Rate 10/15/22 1529 79     Resp 10/15/22 1529 16     Temp 10/15/22 1529 98.3 F (36.8 C)     Temp src --      SpO2 10/15/22 1529 98 %     Weight --      Height --      Head Circumference --      Peak Flow --      Pain Score 10/15/22 1527 0     Pain Loc --      Pain Edu? --      Excl. in GC? --    No data found.  Updated Vital Signs BP 138/87   Pulse 79   Temp 98.3 F (36.8 C)   Resp 16   SpO2 98%   Visual Acuity Right Eye Distance:   Left Eye Distance:   Bilateral Distance:    Right Eye Near:   Left Eye Near:    Bilateral Near:     Physical Exam Constitutional:      General: He is not in acute distress.    Appearance: Normal appearance. He is not toxic-appearing or diaphoretic.  HENT:     Head: Normocephalic and atraumatic.     Mouth/Throat:     Mouth: Mucous membranes are moist.     Pharynx: No pharyngeal swelling.     Comments: Tongue normal.  No throat swelling. Eyes:     Extraocular Movements: Extraocular  movements intact.     Conjunctiva/sclera: Conjunctivae normal.  Cardiovascular:     Rate and Rhythm: Normal rate and regular rhythm.     Pulses: Normal pulses.     Heart sounds: Normal heart sounds.  Pulmonary:     Effort: Pulmonary effort is normal. No respiratory distress.     Breath sounds: Normal breath sounds. No stridor. No wheezing, rhonchi or rales.  Neurological:     General: No focal deficit present.     Mental Status: He is alert and oriented to person, place, and time. Mental status is at baseline.  Psychiatric:        Mood and Affect: Mood normal.        Behavior: Behavior normal.        Thought Content: Thought content normal.        Judgment: Judgment normal.      UC Treatments / Results  Labs (all labs ordered are listed, but only abnormal results are displayed) Labs Reviewed - No data to display  EKG   Radiology No results found.  Procedures Procedures (including critical care time)  Medications Ordered in UC Medications  dexamethasone (DECADRON) injection 10 mg (10 mg Intramuscular Given 10/15/22 1540)  diphenhydrAMINE (BENADRYL) injection 50 mg (50 mg Intramuscular Given 10/15/22 1540)    Initial Impression / Assessment and Plan / UC Course  I have reviewed the triage vital signs and the nursing notes.  Pertinent labs & imaging results that were available during my care of the patient were reviewed by me and considered in my medical decision making (see chart for details).     Patient presenting with allergic reaction but there are no signs of anaphylaxis on exam.  There is no throat swelling, no tongue swelling, no wheezing, no stridor,  no shortness of breath so do not think that epinephrine administration or emergent evaluation is necessary.  IM Decadron and IM Benadryl administered in urgent care with patient stating that he felt much better.  Therefore, patient is safe for discharge.  Will prescribe prednisone steroid course for patient to start  taking tomorrow.  Patient has intermittently had steroid bursts and tolerated well. Therefore, I do think this is safe as opposed to steroid taper especially given the patient reports that his blood sugars increase with steroids.  Patient advised to monitor blood glucose very closely while on steroid therapy.  He does have an EpiPen at home so encouraged him to use it as needed.  Advised ER precautions if symptoms persist or worsen as well.  Epic gave a warning that patient is allergic to benzonatate and we should use caution with prednisone but patient has taken this before so this should be safe.  Patient verbalized understanding and was agreeable with plan. Final Clinical Impressions(s) / UC Diagnoses   Final diagnoses:  Allergic reaction, initial encounter     Discharge Instructions      I have prescribed prednisone for you to start taking tomorrow.  Monitor blood glucose.  Go to the hospital if symptoms persist or worsen and use your EpiPen.    ED Prescriptions     Medication Sig Dispense Auth. Provider   predniSONE (DELTASONE) 20 MG tablet Take 2 tablets (40 mg total) by mouth daily for 5 days. 10 tablet Gustavus Bryant, [place]      PDMP not reviewed this encounter.   Gustavus Bryant, [place] 10/15/22 1640

## 2022-10-18 ENCOUNTER — Ambulatory Visit (INDEPENDENT_AMBULATORY_CARE_PROVIDER_SITE_OTHER): Payer: BC Managed Care – PPO | Admitting: Podiatry

## 2022-10-18 ENCOUNTER — Telehealth: Payer: Self-pay | Admitting: Family Medicine

## 2022-10-18 ENCOUNTER — Encounter: Payer: Self-pay | Admitting: Podiatry

## 2022-10-18 VITALS — BP 152/92

## 2022-10-18 DIAGNOSIS — M79676 Pain in unspecified toe(s): Secondary | ICD-10-CM

## 2022-10-18 DIAGNOSIS — B351 Tinea unguium: Secondary | ICD-10-CM | POA: Diagnosis not present

## 2022-10-18 DIAGNOSIS — N182 Chronic kidney disease, stage 2 (mild): Secondary | ICD-10-CM

## 2022-10-18 DIAGNOSIS — E1122 Type 2 diabetes mellitus with diabetic chronic kidney disease: Secondary | ICD-10-CM | POA: Diagnosis not present

## 2022-10-18 DIAGNOSIS — L84 Corns and callosities: Secondary | ICD-10-CM | POA: Diagnosis not present

## 2022-10-18 LAB — ALPHA-GAL PANEL
Allergen Lamb IgE: <0.1 kU/L
Beef IgE: <0.1 kU/L
IgE (Immunoglobulin E), Serum: 80 IU/mL (ref 6–495)
O215-IgE Alpha-Gal: <0.1 kU/L
Pork IgE: <0.1 kU/L

## 2022-10-18 LAB — ALLERGENS(14)
Allergen Corn, IgE: 0.11 kU/L — AB
Allergen Salmon IgE: <0.1 kU/L
Chocolate/Cacao IgE: <0.1 kU/L
Codfish IgE: <0.1 kU/L
Egg, Whole IgE: 0.3 kU/L — AB
F037-IgE Mussel: <0.1 kU/L
Milk IgE: <0.1 kU/L
Peanut IgE: 0.15 kU/L — AB
Shrimp IgE: <0.1 kU/L
Soybean IgE: 0.14 kU/L — AB
Tuna: <0.1 kU/L
Wheat IgE: 0.4 kU/L — AB

## 2022-10-18 LAB — ALLERGEN PROFILE, SHELLFISH
Clam IgE: <0.1 kU/L
F023-IgE Crab: <0.1 kU/L
F080-IgE Lobster: <0.1 kU/L
F290-IgE Oyster: <0.1 kU/L
Scallop IgE: <0.1 kU/L

## 2022-10-18 LAB — ALLERGEN, MUSHROOM, RF212: Mushroom IgE: <0.1 kU/L

## 2022-10-18 NOTE — Telephone Encounter (Signed)
Left message on machine to discuss lab results 

## 2022-10-18 NOTE — Progress Notes (Signed)
  Subjective:  Patient ID: Steven [name], male    DOB: 1980-12-02,  MRN: [id number]  Ceylon Maxim presents to clinic today for at risk foot care. Pt has h/o NIDDM with chronic kidney disease and callus(es) b/l feet and painful thick toenails that are difficult to trim. Painful toenails interfere with ambulation. Aggravating factors include wearing enclosed shoe gear. Pain is relieved with periodic professional debridement. Painful calluses are aggravated when weightbearing with and without shoegear. Pain is relieved with periodic professional debridement.  Chief Complaint  Patient presents with   Nail Problem    DFC BS-200 A1C-9 or 12? PCP-Katherine Clark PCP VST- Couple weeks ago   New problem(s): None.   PCP is Doreene Nest, NP.  Allergies  Allergen Reactions   Lisinopril Swelling    Swelling of lower lip while on lisinopril   Penicillins Hives and Swelling    REACTION: rash and swelling   Levaquin [Levofloxacin In D5w] Rash   Tessalon [Benzonatate] Rash   Aspirin Nausea Only    Review of Systems: Negative except as noted in the HPI.  Objective: No changes noted in today's physical examination. Vitals:   10/18/22 0935  BP: (!) 152/92   Spiro Vargaz is a pleasant 42 y.o. male morbidly obese in NAD. AAO x 3.  Vascular:  Capillary refill time to digits immediate b/l. Palpable DP pulse(s) b/l lower extremities Palpable PT pulse(s) b/l lower extremities Pedal hair absent. Lower extremity skin temperature gradient within normal limits. No pain with calf compression b/l. Lymphedema present b/l lower extremities.  Dermatological:  Skin warm and supple b/l lower extremities. No open wounds b/l lower extremities. No interdigital macerations b/l lower extremities. Toenails 1-5 b/l elongated, discolored, dystrophic, thickened, crumbly with subungual debris and tenderness to dorsal palpation.   Hyperkeratotic lesion(s) submet head 5 left foot.  No erythema, no edema, no  drainage, no fluctuance.  Musculoskeletal:  Normal muscle strength 5/5 to all lower extremity muscle groups bilaterally. No pain crepitus or joint limitation noted with ROM b/l lower extremities. No gross bony deformities b/l lower extremities.  Neurological:  Protective sensation intact 5/5 intact bilaterally with 10g monofilament b/l. Vibratory sensation intact b/l.  Assessment/Plan: 1. Pain due to onychomycosis of toenail   2. Callus   3. Type 2 diabetes mellitus with stage 2 chronic kidney disease, without long-term current use of insulin     -Consent given for treatment as described below: -Examined patient. -Continue supportive shoe gear daily. -Mycotic toenails 1-5 bilaterally were debrided in length and girth with sterile nail nippers and dremel without incident. -Callus(es) submet head 5 b/l pared utilizing sharp debridement with sterile blade without complication or incident. Total number debrided =2. -Patient/POA to call should there be question/concern in the interim.   Return in about 9 weeks (around 12/20/2022).  Freddie Breech, DPM

## 2022-10-19 ENCOUNTER — Ambulatory Visit (INDEPENDENT_AMBULATORY_CARE_PROVIDER_SITE_OTHER): Payer: BC Managed Care – PPO | Admitting: Primary Care

## 2022-10-19 ENCOUNTER — Other Ambulatory Visit: Payer: Self-pay | Admitting: Primary Care

## 2022-10-19 ENCOUNTER — Encounter: Payer: Self-pay | Admitting: Primary Care

## 2022-10-19 VITALS — BP 130/84 | HR 95 | Temp 98.2°F | Ht 74.0 in | Wt 276.0 lb

## 2022-10-19 DIAGNOSIS — I10 Essential (primary) hypertension: Secondary | ICD-10-CM

## 2022-10-19 DIAGNOSIS — E876 Hypokalemia: Secondary | ICD-10-CM

## 2022-10-19 DIAGNOSIS — T7840XA Allergy, unspecified, initial encounter: Secondary | ICD-10-CM | POA: Diagnosis not present

## 2022-10-19 LAB — POTASSIUM: Potassium: 3.7 meq/L (ref 3.5–5.1)

## 2022-10-19 NOTE — Progress Notes (Signed)
Subjective:    Patient ID: Steven [name], male    DOB: 1980/10/02, 42 y.o.   MRN: [id number]  HPI  Steven [name] is a very pleasant 42 y.o. male with a history of hypertension, HIV, severe asthma, hypothyroidism, type 2 diabetes, CKD, lymphedema, hypokalemia who presents today for follow up of hypertension and hypokalemia.  He was last evaluated on 10/04/22 for several chronic conditions. His potassium level had consistently remained too low despite compliance to potassium chloride 20 mEq 20 BID. Given this, we discontinued his HCTZ and potassium chloride, continued amlodipine 10 mg, added spironolactone 25 mg daily. He is here for follow up today.  Since his last visit he is compliant to his amlodipine 10 mg daily, spironolactone 25 mg daily. He has not taken potassium chloride tablets. He is checking BP at home which is running about the same as today.    He denies chest pain, headaches, and dizziness.   BP Readings from Last 3 Encounters:  10/19/22 130/84  10/18/22 (!) 152/92  10/15/22 138/87      Review of Systems  Respiratory:  Negative for shortness of breath.   Cardiovascular:  Negative for chest pain.  Neurological:  Negative for dizziness and headaches.         Past Medical History:  Diagnosis Date   Asthma    Cancer    Colitis 05/27/2011   Diabetes mellitus without complication    HIV positive 03/23/09   Genotype Y181C   HTN (hypertension)    Kaposi's sarcoma    Syphilis 03/23/09-   1:2    Social History   Socioeconomic History   Marital status: Single    Spouse name: Not on file   Number of children: Not on file   Years of education: Not on file   Highest education level: Not on file  Occupational History   Not on file  Tobacco Use   Smoking status: Never   Smokeless tobacco: Never  Vaping Use   Vaping Use: Never used  Substance and Sexual Activity   Alcohol use: Yes    Alcohol/week: 0.0 standard drinks of alcohol    Comment: socially - less  now   Drug use: No   Sexual activity: Not Currently    Partners: Male    Birth control/protection: Condom    Comment: pt. declined condoms  Other Topics Concern   Not on file  Social History Narrative   Single.    No children.   Works in Clinical biochemist   Enjoys DJ, traveling.    Social Determinants of Health   Financial Resource Strain: Not on file  Food Insecurity: Not on file  Transportation Needs: Not on file  Physical Activity: Not on file  Stress: Not on file  Social Connections: Not on file  Intimate Partner Violence: Not on file    Past Surgical History:  Procedure Laterality Date   BRAIN SURGERY     IR GENERIC HISTORICAL  02/12/2016   IR REMOVAL TUN ACCESS W/ PORT W/O FL MOD SED 02/12/2016 Richarda Overlie, MD [facility]    Family History  Problem Relation Age of Onset   Pancreatic cancer Mother    Cancer Mother    Diabetes Paternal Grandmother    Thyroid disease Paternal Grandmother     Allergies  Allergen Reactions   Lisinopril Swelling    Swelling of lower lip while on lisinopril   Penicillins Hives and Swelling    REACTION: rash and swelling   Levaquin [Levofloxacin In  D5w] Rash   Tessalon [Benzonatate] Rash   Aspirin Nausea Only    Current Outpatient Medications on File Prior to Visit  Medication Sig Dispense Refill   abacavir-dolutegravir-lamiVUDine (TRIUMEQ) 600-50-300 MG tablet Take 1 tablet by mouth daily. 30 tablet 11   albuterol (VENTOLIN HFA) 108 (90 Base) MCG/ACT inhaler Inhale 2 puffs every 4-6 hours as as needed for cough, wheeze, tightness in chest, or shortness of breath 18 g 1   amLODipine (NORVASC) 10 MG tablet TAKE 1 TABLET(10 MG) BY MOUTH DAILY FOR BLOOD PRESSURE 90 tablet 3   Blood Glucose Monitoring Suppl (ONETOUCH VERIO) w/Device KIT USE AS DIRECTED TO TEST UP TO FOUR TIMES DAILY     ciclopirox (PENLAC) 8 % solution APPLY 1 COAT TO TOENAIL EVERY DAY FOR 48 WEEKS. REMOVE WEEKLY WITH POLISH REMOVER 6.6 mL 11   EPINEPHrine 0.3 mg/0.3 mL  IJ SOAJ injection INJECT 0.3 MG IN THE MUSCLE AS NEEDED FOR ANAPHYLAXIS 2 each 1   esomeprazole (NEXIUM) 40 MG capsule TAKE ONE CAPSULE BY MOUTH ONCE DAILY AS DIRECTED (Patient taking differently: Take 40 mg by mouth daily.) 30 capsule 0   fexofenadine (ALLEGRA) 180 MG tablet Take 1 tablet (180 mg total) by mouth daily. 15 tablet 0   fluticasone (FLONASE) 50 MCG/ACT nasal spray Use 1 to 2 sprays in each nostril once a day as needed for stuffy nose 16 g 5   fluticasone-salmeterol (ADVAIR HFA) 230-21 MCG/ACT inhaler INHALE 2 PUFFS BY MOUTH TWICE DAILY 12 g 5   glipiZIDE (GLUCOTROL XL) 5 MG 24 hr tablet TAKE 1 TABLET(5 MG) BY MOUTH DAILY WITH BREAKFAST FOR DIABETES 90 tablet 0   glucose blood (ONETOUCH ULTRA) test strip USE AS DIRECTED UP TO FOUR TIMES DAILY. 300 strip 3   glucose blood test strip Use as instructed to test up to 4 times a day DX E11.9 100 each 3   LANCETS MICRO THIN 33G MISC USE TO TEST BLOOD SUGAR ONCE A DAY 100 each 2   levothyroxine (SYNTHROID) 150 MCG tablet TAKE 1 TABLET(150 MCG) BY MOUTH DAILY 90 tablet 1   metFORMIN (GLUCOPHAGE-XR) 500 MG 24 hr tablet TAKE 2 TABLETS BY MOUTH IN THE MORNING AND IN THE EVENING FOR DIABETES 360 tablet 1   montelukast (SINGULAIR) 10 MG tablet TAKE 1 TABLET(10 MG) BY MOUTH DAILY 90 tablet 1   Multiple Vitamin (MULTIVITAMIN) tablet Take 1 tablet by mouth daily.     olopatadine (PATANOL) 0.1 % ophthalmic solution Place 1 drop into both eyes 2 (two) times daily. 5 mL 0   ONE TOUCH ULTRA TEST test strip TEST BLOOD GLUCOSE THREE TIMES DAILY AS DIRECTED 100 each 2   potassium chloride SA (KLOR-CON M) 20 MEQ tablet Take 2 tablets (40 mEq total) by mouth daily. 180 tablet 3   rosuvastatin (CRESTOR) 5 MG tablet TAKE 1 TABLET BY MOUTH EVERY DAY FOR CHOLESTEROL 90 tablet 2   spironolactone (ALDACTONE) 25 MG tablet Take 1 tablet (25 mg total) by mouth daily. for blood pressure. 30 tablet 0   TRELEGY ELLIPTA 200-62.5-25 MCG/ACT AEPB Inhale 1 puff into the lungs  daily.     famotidine (PEPCID) 20 MG tablet Take 1 tablet (20 mg total) by mouth daily for 14 days. (Patient not taking: Reported on 08/23/2022) 30 tablet 0   ibuprofen (ADVIL) 800 MG tablet Take 800 mg by mouth 3 (three) times daily as needed. (Patient not taking: Reported on 10/19/2022)     predniSONE (DELTASONE) 20 MG tablet Take 2 tablets (40  mg total) by mouth daily for 5 days. (Patient not taking: Reported on 10/19/2022) 10 tablet 0   Current Facility-Administered Medications on File Prior to Visit  Medication Dose Route Frequency Provider Last Rate Last Admin   omalizumab Geoffry Paradise) injection 300 mg  300 mg Subcutaneous Q14 Days Wyline Mood M, DO   300 mg at 10/12/22 1236    BP 130/84   Pulse 95   Temp 98.2 F (36.8 C) (Temporal)   Ht 6\' 2"  (1.88 m)   Wt 276 lb (125.2 kg)   SpO2 98%   BMI 35.44 kg/m  Objective:   Physical Exam Cardiovascular:     Rate and Rhythm: Normal rate and regular rhythm.  Pulmonary:     Effort: Pulmonary effort is normal.     Breath sounds: Normal breath sounds. No wheezing or rales.  Musculoskeletal:     Cervical back: Neck supple.  Skin:    General: Skin is warm and dry.  Neurological:     Mental Status: He is alert and oriented to person, place, and time.           Assessment & Plan:  Hypokalemia Assessment & Plan: Repeat potassium level pending.  Continue spironolactone 25 mg daily. Remain off potassium chloride 20 mEq BID for now.  Orders: -     Potassium  Essential hypertension Assessment & Plan: Controlled.  Continue spironolactone 25 mg daily, amlodipine 10 mg daily. Remain off potassium chloride.  Repeat potassium level pending.  Orders: -     Potassium        Doreene Nest, NP

## 2022-10-19 NOTE — Patient Instructions (Signed)
Stop by the lab prior to leaving today. I will notify you of your results once received.   Please schedule a follow up visit for 3 months.  It was a pleasure to see you today!  

## 2022-10-19 NOTE — Assessment & Plan Note (Signed)
Controlled.  Continue spironolactone 25 mg daily, amlodipine 10 mg daily. Remain off potassium chloride.  Repeat potassium level pending.

## 2022-10-19 NOTE — Assessment & Plan Note (Signed)
Repeat potassium level pending.  Continue spironolactone 25 mg daily. Remain off potassium chloride 20 mEq BID for now.

## 2022-10-20 ENCOUNTER — Other Ambulatory Visit: Payer: Self-pay | Admitting: Primary Care

## 2022-10-20 DIAGNOSIS — I10 Essential (primary) hypertension: Secondary | ICD-10-CM

## 2022-10-20 NOTE — Telephone Encounter (Signed)
Patient calling for lab results and didn't see anything written pertaining to the lab results. Patient also stated you may leave a MyChart message.

## 2022-10-21 NOTE — Progress Notes (Signed)
Can you please let this patient know that the test results are back. His food allergy testing was still positive to wheat, corn, peanut, soy, and egg.  Milk, shrimp, and chocolate appear to be negative.  Please have him avoid these foods if he has previously had a reaction to these foods.  His alpha gal level was negative at this time.  And shellfish panel was negative at this time.  Please remind him to be aware that we did this testing while he may have had some Xolair still in his system and testing results may be skewed.  Thank you

## 2022-10-28 ENCOUNTER — Encounter: Payer: Self-pay | Admitting: Family Medicine

## 2022-10-31 ENCOUNTER — Ambulatory Visit: Payer: BC Managed Care – PPO | Admitting: Allergy

## 2022-11-17 ENCOUNTER — Other Ambulatory Visit: Payer: Self-pay | Admitting: Infectious Diseases

## 2022-11-17 DIAGNOSIS — B20 Human immunodeficiency virus [HIV] disease: Secondary | ICD-10-CM

## 2022-11-19 DIAGNOSIS — T7840XA Allergy, unspecified, initial encounter: Secondary | ICD-10-CM | POA: Diagnosis not present

## 2022-11-19 DIAGNOSIS — R21 Rash and other nonspecific skin eruption: Secondary | ICD-10-CM | POA: Diagnosis not present

## 2022-11-24 ENCOUNTER — Ambulatory Visit: Payer: BC Managed Care – PPO | Admitting: Family Medicine

## 2022-11-24 NOTE — Progress Notes (Deleted)
   [address] [place] [address] Dept: [phone]  FOLLOW UP NOTE  Patient ID: Steven [name], male    DOB: July 06, 1981  Age: 42 y.o. MRN: [id number] Date of Office Visit: 11/24/2022  Assessment  Chief Complaint: No chief complaint on file.  HPI Steven [name] is a 42 year old male who presents to the clinic for follow-up visit.  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 Of note, his last IgE was 80 and he has documented food allergies as well as perennial allergies.  Drug Allergies:  Allergies  Allergen Reactions   Lisinopril Swelling    Swelling of lower lip while on lisinopril   Penicillins Hives and Swelling    REACTION: rash and swelling   Levaquin [Levofloxacin In D5w] Rash   Tessalon [Benzonatate] Rash   Aspirin Nausea Only    Physical Exam: There were no vitals taken for this visit.   Physical Exam  Diagnostics:    Assessment and Plan: No diagnosis found.  No orders of the defined types were placed in this encounter.   There are no Patient Instructions on file for this visit.  No follow-ups on file.    Thank you for the opportunity to care for this patient.  Please do not hesitate to contact me with questions.  Thermon Leyland, FNP [facility]

## 2022-11-26 ENCOUNTER — Other Ambulatory Visit: Payer: Self-pay | Admitting: Primary Care

## 2022-11-26 DIAGNOSIS — E119 Type 2 diabetes mellitus without complications: Secondary | ICD-10-CM

## 2022-11-30 NOTE — Progress Notes (Deleted)
   [address] [place] [address] Dept: [phone]  FOLLOW UP NOTE  Patient ID: Steven [name], male    DOB: 1980/09/11  Age: 42 y.o. MRN: [id number] Date of Office Visit: 12/01/2022  Assessment  Chief Complaint: No chief complaint on file.  HPI Steven [name] is a 42 year old male who presents to the clinic for follow-up visit.  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 Of note, his last IgE was 80 and he has documented food allergies as well as perennial allergies.  Drug Allergies:  Allergies  Allergen Reactions  . Lisinopril Swelling    Swelling of lower lip while on lisinopril  . Penicillins Hives and Swelling    REACTION: rash and swelling  . Levaquin [Levofloxacin In D5w] Rash  . Tessalon [Benzonatate] Rash  . Aspirin Nausea Only    Physical Exam: There were no vitals taken for this visit.   Physical Exam  Diagnostics:    Assessment and Plan: No diagnosis found.  No orders of the defined types were placed in this encounter.   There are no Patient Instructions on file for this visit.  No follow-ups on file.    Thank you for the opportunity to care for this patient.  Please do not hesitate to contact me with questions.  Thermon Leyland, FNP [facility]

## 2022-12-01 ENCOUNTER — Ambulatory Visit: Payer: BC Managed Care – PPO | Admitting: Family Medicine

## 2022-12-01 DIAGNOSIS — L299 Pruritus, unspecified: Secondary | ICD-10-CM | POA: Diagnosis not present

## 2022-12-01 DIAGNOSIS — T7840XA Allergy, unspecified, initial encounter: Secondary | ICD-10-CM | POA: Diagnosis not present

## 2022-12-01 DIAGNOSIS — R220 Localized swelling, mass and lump, head: Secondary | ICD-10-CM | POA: Diagnosis not present

## 2022-12-01 DIAGNOSIS — E1165 Type 2 diabetes mellitus with hyperglycemia: Secondary | ICD-10-CM | POA: Diagnosis not present

## 2022-12-07 DIAGNOSIS — J309 Allergic rhinitis, unspecified: Secondary | ICD-10-CM | POA: Diagnosis not present

## 2022-12-07 DIAGNOSIS — L299 Pruritus, unspecified: Secondary | ICD-10-CM | POA: Diagnosis not present

## 2022-12-07 DIAGNOSIS — R2231 Localized swelling, mass and lump, right upper limb: Secondary | ICD-10-CM | POA: Diagnosis not present

## 2022-12-16 ENCOUNTER — Ambulatory Visit: Payer: BC Managed Care – PPO | Admitting: Podiatry

## 2022-12-20 ENCOUNTER — Ambulatory Visit: Payer: BC Managed Care – PPO | Admitting: Podiatry

## 2022-12-21 ENCOUNTER — Other Ambulatory Visit: Payer: Self-pay | Admitting: Primary Care

## 2022-12-21 DIAGNOSIS — E1165 Type 2 diabetes mellitus with hyperglycemia: Secondary | ICD-10-CM

## 2022-12-22 DIAGNOSIS — E1165 Type 2 diabetes mellitus with hyperglycemia: Secondary | ICD-10-CM

## 2022-12-23 DIAGNOSIS — T7840XA Allergy, unspecified, initial encounter: Secondary | ICD-10-CM | POA: Diagnosis not present

## 2022-12-23 MED ORDER — GLIPIZIDE ER 5 MG PO TB24: ORAL_TABLET | ORAL | 0 refills | Status: DC

## 2022-12-26 ENCOUNTER — Ambulatory Visit (INDEPENDENT_AMBULATORY_CARE_PROVIDER_SITE_OTHER): Payer: BC Managed Care – PPO | Admitting: Podiatry

## 2022-12-26 VITALS — BP 159/90

## 2022-12-26 DIAGNOSIS — M79676 Pain in unspecified toe(s): Secondary | ICD-10-CM | POA: Diagnosis not present

## 2022-12-26 DIAGNOSIS — E1122 Type 2 diabetes mellitus with diabetic chronic kidney disease: Secondary | ICD-10-CM | POA: Diagnosis not present

## 2022-12-26 DIAGNOSIS — L84 Corns and callosities: Secondary | ICD-10-CM | POA: Diagnosis not present

## 2022-12-26 DIAGNOSIS — B351 Tinea unguium: Secondary | ICD-10-CM | POA: Diagnosis not present

## 2022-12-26 DIAGNOSIS — N182 Chronic kidney disease, stage 2 (mild): Secondary | ICD-10-CM | POA: Diagnosis not present

## 2022-12-26 NOTE — Progress Notes (Unsigned)
  Subjective:  Patient ID: Steven [name], male    DOB: 1981-03-17,  MRN: [id number]  Thurl Kristiansen presents to clinic today for {jgcomplaint:23593}No chief complaint on file.   New problem(s): None. {jgcomplaint:23593}  PCP is Doreene Nest, NP.  Allergies  Allergen Reactions   Lisinopril Swelling    Swelling of lower lip while on lisinopril   Penicillins Hives and Swelling    REACTION: rash and swelling   Levaquin [Levofloxacin In D5w] Rash   Tessalon [Benzonatate] Rash   Aspirin Nausea Only    Review of Systems: Negative except as noted in the HPI. Objective:  There were no vitals filed for this visit. Constitutional Steven [name] is a pleasant 42 y.o. male, {jgbodyhabitus:24098} AAO x 3.   Vascular Vascular Examination: Capillary refill time immediate b/l. Vascular status intact b/l with palpable pedal pulses. Pedal hair present ***. No edema. No pain with calf compression b/l. Skin temperature gradient WNL b/l. No cyanosis or clubbing b/l. {jgvascular:23595}  Neurological Examination: Sensation grossly intact b/l with 10 gram monofilament. Vibratory sensation intact b/l. {jgneuro:23601::"Protective sensation intact 5/5 intact bilaterally with 10g monofilament b/l.","Vibratory sensation intact b/l.","Proprioception intact bilaterally."}  Dermatological Examination: Pedal skin with normal turgor, texture and tone b/l.  No open wounds. No interdigital macerations.   Toenails 1-5 b/l thick, discolored, elongated with subungual debris and pain on dorsal palpation.   {jgderm:23598}  Musculoskeletal Examination: {jgmsk:23600}  Radiographs: None  Last A1c:      Latest Ref Rng & Units 08/23/2022    8:39 AM 05/30/2022    8:54 AM  Hemoglobin A1C  Hemoglobin-A1c 4.6 - 6.5 % 7.4  9.0        Assessment:  No diagnosis found. Plan:  Patient was evaluated and treated and all questions answered. Consent given for treatment as described  below: {jgplan:23602::"-Patient/POA to call should there be question/concern in the interim."}  No follow-ups on file.  Freddie Breech, DPM

## 2022-12-28 ENCOUNTER — Encounter: Payer: Self-pay | Admitting: Podiatry

## 2022-12-28 ENCOUNTER — Other Ambulatory Visit: Payer: Self-pay

## 2022-12-28 ENCOUNTER — Emergency Department (HOSPITAL_COMMUNITY)
Admission: EM | Admit: 2022-12-28 | Discharge: 2022-12-28 | Disposition: A | Discharge status: Home / Self Care | Payer: BC Managed Care – PPO | Attending: Emergency Medicine | Admitting: Emergency Medicine

## 2022-12-28 ENCOUNTER — Emergency Department (HOSPITAL_COMMUNITY): Payer: BC Managed Care – PPO

## 2022-12-28 DIAGNOSIS — J45909 Unspecified asthma, uncomplicated: Secondary | ICD-10-CM | POA: Diagnosis not present

## 2022-12-28 DIAGNOSIS — R0789 Other chest pain: Secondary | ICD-10-CM | POA: Diagnosis not present

## 2022-12-28 DIAGNOSIS — Z7951 Long term (current) use of inhaled steroids: Secondary | ICD-10-CM | POA: Insufficient documentation

## 2022-12-28 DIAGNOSIS — R079 Chest pain, unspecified: Secondary | ICD-10-CM | POA: Diagnosis not present

## 2022-12-28 DIAGNOSIS — E1122 Type 2 diabetes mellitus with diabetic chronic kidney disease: Secondary | ICD-10-CM | POA: Diagnosis not present

## 2022-12-28 DIAGNOSIS — Z21 Asymptomatic human immunodeficiency virus [HIV] infection status: Secondary | ICD-10-CM | POA: Diagnosis not present

## 2022-12-28 DIAGNOSIS — N183 Chronic kidney disease, stage 3 unspecified: Secondary | ICD-10-CM | POA: Diagnosis not present

## 2022-12-28 DIAGNOSIS — Z7984 Long term (current) use of oral hypoglycemic drugs: Secondary | ICD-10-CM | POA: Diagnosis not present

## 2022-12-28 LAB — COMPREHENSIVE METABOLIC PANEL
ALT: 21 U/L (ref 0–44)
AST: 19 U/L (ref 15–41)
Albumin: 4.6 g/dL (ref 3.5–5.0)
Alkaline Phosphatase: 51 U/L (ref 38–126)
Anion gap: 10 (ref 5–15)
BUN: 16 mg/dL (ref 6–20)
CO2: 25 mmol/L (ref 22–32)
Calcium: 9.1 mg/dL (ref 8.9–10.3)
Chloride: 102 mmol/L (ref 98–111)
Creatinine, Ser: 1.18 mg/dL (ref 0.61–1.24)
GFR, Estimated: >60 mL/min (ref >60)
Glucose, Bld: 193 mg/dL — ABNORMAL HIGH (ref 70–99)
Potassium: 3.4 mmol/L — ABNORMAL LOW (ref 3.5–5.1)
Sodium: 137 mmol/L (ref 135–145)
Total Bilirubin: 1.9 mg/dL — ABNORMAL HIGH (ref 0.3–1.2)
Total Protein: 7.8 g/dL (ref 6.5–8.1)

## 2022-12-28 LAB — CBC WITH DIFFERENTIAL/PLATELET
Abs Immature Granulocytes: 0.03 10*3/uL (ref 0.00–0.07)
Basophils Absolute: 0 10*3/uL (ref 0.0–0.1)
Basophils Relative: 1 %
Eosinophils Absolute: 0.2 10*3/uL (ref 0.0–0.5)
Eosinophils Relative: 4 %
HCT: 49.1 % (ref 39.0–52.0)
Hemoglobin: 15.5 g/dL (ref 13.0–17.0)
Immature Granulocytes: 1 %
Lymphocytes Relative: 36 %
Lymphs Abs: 1.8 10*3/uL (ref 0.7–4.0)
MCH: 25.7 pg — ABNORMAL LOW (ref 26.0–34.0)
MCHC: 31.6 g/dL (ref 30.0–36.0)
MCV: 81.3 fL (ref 80.0–100.0)
Monocytes Absolute: 0.4 10*3/uL (ref 0.1–1.0)
Monocytes Relative: 8 %
Neutro Abs: 2.5 10*3/uL (ref 1.7–7.7)
Neutrophils Relative %: 50 %
Platelets: 212 10*3/uL (ref 150–400)
RBC: 6.04 MIL/uL — ABNORMAL HIGH (ref 4.22–5.81)
RDW: 13.4 % (ref 11.5–15.5)
WBC: 4.9 10*3/uL (ref 4.0–10.5)
nRBC: 0 % (ref 0.0–0.2)

## 2022-12-28 LAB — TROPONIN I (HIGH SENSITIVITY)
Troponin I (High Sensitivity): 3 ng/L (ref <18)
Troponin I (High Sensitivity): 3 ng/L (ref <18)

## 2022-12-28 NOTE — ED Triage Notes (Signed)
Left sided chest pain x 3 days, right leg pain x 4-5 days.

## 2022-12-28 NOTE — ED Provider Notes (Signed)
[place] EMERGENCY DEPARTMENT AT [facility] Provider Note   CSN: [id number] Arrival date & time: 12/28/22  1548     History Chief Complaint  Patient presents with   Chest Pain    Steven [name] is a 42 y.o. male.  Patient with past medical history significant for Kaposi's sarcoma, syphilis, CKD stage III, type 2 diabetes, HIV presents to the emergency department complaints of chest pain.  No prior cardiac history.  Reports that he recently had a blood pressure medication changes he was discontinued hydrochlorothiazide and started on spironolactone in combination with amlodipine as he was having multiple episodes of hypokalemia.  Denies any increasing difficulty breathing or shortness of breath but does have a past history significant for asthma.  No recent fevers, chills, headache.  Patient does report some cold sweats and chills but does endorse that this may be secondary due to his hyperthyroidism.   Chest Pain      Home Medications Prior to Admission medications   Medication Sig Start Date End Date Taking? Authorizing Provider  abacavir-dolutegravir-lamiVUDine (TRIUMEQ) 600-50-300 MG tablet TAKE 1 TABLET BY MOUTH DAILY 11/17/22   Ginnie Smart, MD  albuterol (VENTOLIN HFA) 108 (90 Base) MCG/ACT inhaler Inhale 2 puffs every 4-6 hours as as needed for cough, wheeze, tightness in chest, or shortness of breath 08/03/22   Nehemiah Settle, FNP  amLODipine (NORVASC) 10 MG tablet TAKE 1 TABLET(10 MG) BY MOUTH DAILY FOR BLOOD PRESSURE 07/25/22   Doreene Nest, NP  Blood Glucose Monitoring Suppl (ONETOUCH VERIO) w/Device KIT USE AS DIRECTED TO TEST UP TO FOUR TIMES DAILY 02/19/21   [provider]  ciclopirox (PENLAC) 8 % solution APPLY 1 COAT TO TOENAIL EVERY DAY FOR 48 WEEKS. REMOVE WEEKLY WITH POLISH REMOVER 06/22/21   Freddie Breech, DPM  EPINEPHrine 0.3 mg/0.3 mL IJ SOAJ injection INJECT 0.3 MG IN THE MUSCLE AS NEEDED FOR ANAPHYLAXIS 08/03/22   Nehemiah Settle, FNP  esomeprazole (NEXIUM) 40 MG capsule TAKE ONE CAPSULE BY MOUTH ONCE DAILY AS DIRECTED Patient taking differently: Take 40 mg by mouth daily. 02/06/17   Kozlow, Alvira Philips, MD  famotidine (PEPCID) 20 MG tablet Take 1 tablet (20 mg total) by mouth daily for 14 days. Patient not taking: Reported on 08/23/2022 08/12/22 08/26/22  Carlisle Beers, FNP  fexofenadine (ALLEGRA) 180 MG tablet Take 1 tablet (180 mg total) by mouth daily. 08/03/22   Nehemiah Settle, FNP  fluticasone Aleda Grana) 50 MCG/ACT nasal spray Use 1 to 2 sprays in each nostril once a day as needed for stuffy nose 08/03/22   Nehemiah Settle, FNP  fluticasone-salmeterol (ADVAIR HFA) [phone] MCG/ACT inhaler INHALE 2 PUFFS BY MOUTH TWICE DAILY 08/03/22   Nehemiah Settle, FNP  glipiZIDE (GLUCOTROL XL) 5 MG 24 hr tablet TAKE 1 TABLET(5 MG) BY MOUTH DAILY WITH BREAKFAST FOR DIABETES 12/23/22   Doreene Nest, NP  glucose blood (ONETOUCH ULTRA) test strip USE AS DIRECTED UP TO FOUR TIMES DAILY. 02/20/22   Doreene Nest, NP  glucose blood test strip Use as instructed to test up to 4 times a day DX E11.9 01/22/21   Doreene Nest, NP  ibuprofen (ADVIL) 800 MG tablet Take 800 mg by mouth 3 (three) times daily as needed. 01/20/21   [provider]  LANCETS MICRO THIN 33G MISC USE TO TEST BLOOD SUGAR ONCE A DAY 08/21/17   Quentin Angst, MD  levothyroxine (SYNTHROID) 150 MCG tablet TAKE 1 TABLET(150 MCG) BY MOUTH DAILY 08/25/22  Reather Littler, MD  metFORMIN (GLUCOPHAGE-XR) 500 MG 24 hr tablet TAKE 2 TABLETS BY MOUTH IN THE MORNING AND IN THE EVENING FOR DIABETES 07/25/22   Doreene Nest, NP  montelukast (SINGULAIR) 10 MG tablet TAKE 1 TABLET(10 MG) BY MOUTH DAILY 08/03/22   Nehemiah Settle, FNP  Multiple Vitamin (MULTIVITAMIN) tablet Take 1 tablet by mouth daily.    [provider]  olopatadine (PATANOL) 0.1 % ophthalmic solution Place 1 drop into both eyes 2 (two) times daily. 08/12/22   Carlisle Beers,  FNP  ONE TOUCH ULTRA TEST test strip TEST BLOOD GLUCOSE THREE TIMES DAILY AS DIRECTED 10/12/18   Doreene Nest, NP  rosuvastatin (CRESTOR) 5 MG tablet TAKE 1 TABLET BY MOUTH EVERY DAY FOR CHOLESTEROL 07/29/22   Doreene Nest, NP  spironolactone (ALDACTONE) 25 MG tablet TAKE 1 TABLET BY MOUTH EVERY DAY FOR BLOOD PRESSURE 10/20/22   Doreene Nest, NP  TRELEGY ELLIPTA 200-62.5-25 MCG/ACT AEPB Inhale 1 puff into the lungs daily. 09/14/22   [provider]      Allergies    Lisinopril, Penicillins, Levaquin [levofloxacin in d5w], Tessalon [benzonatate], and Aspirin    Review of Systems   Review of Systems  Cardiovascular:  Positive for chest pain.  All other systems reviewed and are negative.   Physical Exam Updated Vital Signs BP (!) 147/100   Pulse 75   Temp 98.3 F (36.8 C) (Oral)   Resp 19   Ht 6\' 2"  (1.88 m)   Wt 127.5 kg   SpO2 98%   BMI 36.08 kg/m  Physical Exam Vitals and nursing note reviewed.  Constitutional:      General: He is not in acute distress.    Appearance: He is well-developed.  HENT:     Head: Normocephalic and atraumatic.  Eyes:     Conjunctiva/sclera: Conjunctivae normal.     Comments: Exophthalmos   Cardiovascular:     Rate and Rhythm: Normal rate and regular rhythm.     Heart sounds: No murmur heard. Pulmonary:     Effort: Pulmonary effort is normal. No respiratory distress.     Breath sounds: Normal breath sounds.  Abdominal:     Palpations: Abdomen is soft.     Tenderness: There is no abdominal tenderness.  Musculoskeletal:        General: No swelling.     Cervical back: Neck supple.  Skin:    General: Skin is warm and dry.     Capillary Refill: Capillary refill takes less than 2 seconds.  Neurological:     Mental Status: He is alert.  Psychiatric:        Mood and Affect: Mood normal.     ED Results / Procedures / Treatments   Labs (all labs ordered are listed, but only abnormal results are displayed) Labs Reviewed   CBC WITH DIFFERENTIAL/PLATELET - Abnormal; Notable for the following components:      Result Value   RBC 6.04 (*)    MCH 25.7 (*)    All other components within normal limits  COMPREHENSIVE METABOLIC PANEL - Abnormal; Notable for the following components:   Potassium 3.4 (*)    Glucose, Bld 193 (*)    Total Bilirubin 1.9 (*)    All other components within normal limits  TROPONIN I (HIGH SENSITIVITY)  TROPONIN I (HIGH SENSITIVITY)    EKG EKG Interpretation  Date/Time:  Wednesday December 28 2022 15:58:42 EDT Ventricular Rate:  83 PR Interval:  162 QRS Duration: 80  QT Interval:  349 QTC Calculation: 410 R Axis:   41 Text Interpretation: Sinus rhythm  nonspecific T wave abnormality similar to prior Confirmed by Vivien Rossetti ([id number]) on 12/28/2022 4:26:32 PM  Radiology DG Chest 2 View  Result Date: 12/28/2022 CLINICAL DATA:  Chest pain. EXAM: CHEST - 2 VIEW COMPARISON:  September 22, 2022. FINDINGS: The heart size and mediastinal contours are within normal limits. Both lungs are clear. The visualized skeletal structures are unremarkable. IMPRESSION: No active cardiopulmonary disease. Electronically Signed   By: Feliberto Harts M.D.   On: 12/28/2022 16:25    Procedures Procedures   Medications Ordered in ED Medications - No data to display  ED Course/ Medical Decision Making/ A&P                           Medical Decision Making Amount and/or Complexity of Data Reviewed Labs: ordered. Radiology: ordered.   This patient presents to the ED for concern of chest pain.  Differential diagnosis includes ACS, PE, bronchitis, pneumonia, costochondritis   Lab Tests:  I Ordered, and personally interpreted labs.  The pertinent results include: CBC unremarkable, CMP unremarkable, troponin negative   Imaging Studies ordered:  I ordered imaging studies including chest x-ray I independently visualized and interpreted imaging which showed no acute cardiopulmonary process I agree  with the radiologist interpretation   Problem List / ED Course:  Patient presents to the emergency department complaints of chest pain.  Reports has been ongoing for few days and is primarily in the left side of his chest without radiation elsewhere.  Denies any associated shortness of breath.  No prior cardiac history.  Significant for syphilis, lymphedema, Graves' disease, CKD, type 2 diabetes, HIV.  Will evaluate with lab workup for potential causes of cardiac complaint. Lab workup is largely unremarkable without any obvious abnormalities in CBC or CMP.  Troponin is negative.  Chest x-ray negative for any acute findings and EKG is reassuring.  Informed patient of results and the patient is likely safe for discharge home with outpatient follow-up with primary care provider.  Patient has remained hemodynamically stable without any acute worsening of his chest pain.  Advised patient to return the emergency department if he has any acute worsening of symptoms.  Patient is agreeable with treatment plan verbalized understanding all return precautions.  All questions answered prior to patient discharge.  Final Clinical Impression(s) / ED Diagnoses Final diagnoses:  Other chest pain    Rx / DC Orders ED Discharge Orders     None         Smitty Knudsen, PA-C 12/28/22 1958    Mardene Sayer, MD 12/29/22 [phone]

## 2022-12-28 NOTE — Discharge Instructions (Signed)
You are seen in the emergency department for chest pain.  Your labs and imaging and EKG were reassuring without any abnormalities noted no evidence of any cardiac condition.  However advise a follow-up with your primary care provider for further evaluation return to the emergency department if your symptoms are worsening.

## 2022-12-30 ENCOUNTER — Ambulatory Visit (HOSPITAL_COMMUNITY)
Admission: EM | Admit: 2022-12-30 | Discharge: 2022-12-30 | Disposition: A | Discharge status: Home / Self Care | Payer: BC Managed Care – PPO | Attending: Emergency Medicine | Admitting: Emergency Medicine

## 2022-12-30 ENCOUNTER — Other Ambulatory Visit: Payer: Self-pay

## 2022-12-30 ENCOUNTER — Encounter (HOSPITAL_COMMUNITY): Payer: Self-pay | Admitting: Emergency Medicine

## 2022-12-30 DIAGNOSIS — T7840XA Allergy, unspecified, initial encounter: Secondary | ICD-10-CM | POA: Diagnosis not present

## 2022-12-30 MED ORDER — DEXAMETHASONE SODIUM PHOSPHATE 10 MG/ML IJ SOLN
INTRAMUSCULAR | Status: AC
  Filled 2022-12-30: qty 1

## 2022-12-30 MED ORDER — PREDNISONE 20 MG PO TABS: 40.0000 mg | ORAL_TABLET | Freq: Every day | ORAL | 0 refills | Status: AC

## 2022-12-30 MED ORDER — DEXAMETHASONE SODIUM PHOSPHATE 10 MG/ML IJ SOLN
10.0000 mg | Freq: Once | INTRAMUSCULAR | Status: AC
  Administered 2022-12-30: 10 mg via INTRAMUSCULAR

## 2022-12-30 NOTE — ED Triage Notes (Signed)
Both hands are itching, both eyes red and swelling, particularly right eye.  patient feels tightness in face.  Started 30 minutes prior to arrival to department.  No knew contacts.  Denies breathing issues.    Has not taken any medications

## 2022-12-30 NOTE — ED Provider Notes (Signed)
[facility]    CSN: [id number] Arrival date & time: 12/30/22  1553      History   Chief Complaint Chief Complaint  Patient presents with   Allergic Reaction    HPI Steven [name] is a 42 y.o. male.   Patient presents to clinic for complaints of an allergic reaction.  Approximately 30 minutes prior to arrival he noticed that both his hands started to itch, and both eyes started to get red and swell.  He feels like he has tightness in his face.  He denies any shortness of breath, trouble swallowing or drooling.  Has multiple allergens, does see an allergist.  Has never had anaphylaxis.  Does have an EpiPen if needed. Some of his allergies are wheat, peanuts, soy, corn and egg. Reports perfume can trigger this reaction sometimes too.   Denies known exposure to specific allergen.    The history is provided by the patient and medical records.  Allergic Reaction Presenting symptoms: rash     Past Medical History:  Diagnosis Date   Asthma    Cancer (HCC)    Colitis 05/27/2011   Diabetes mellitus without complication (HCC)    HIV positive (HCC) 03/23/09   Genotype Y181C   HTN (hypertension)    Kaposi's sarcoma    Syphilis 03/23/09-   1:2    Patient Active Problem List   Diagnosis Date Noted   Routine screening for STI (sexually transmitted infection) 07/28/2022   Sinusitis, acute frontal 07/28/2022   Chronic shoulder pain 12/16/2021   Hypokalemia 08/06/2021   Preoperative clearance 10/29/2020   Hyperlipidemia 05/28/2020   Preventative health care 08/10/2018   Severe persistent asthma without complication 06/15/2018   Allergic reaction 06/15/2018   Food allergy 06/15/2018   Seasonal and perennial allergic rhinoconjunctivitis 06/15/2018   HIV infection (HCC) 02/09/2018   Gastroesophageal reflux disease 03/28/2017   Type 2 diabetes mellitus with hyperglycemia (HCC) 03/25/2015   CKD (chronic kidney disease) stage 3, GFR 30-59 ml/min (HCC) 03/23/2015   Thrush,  oral 03/23/2015   Essential hypertension 11/24/2014   Hypothyroidism, postradioiodine therapy 01/13/2014   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 Authorizing Provider  predniSONE (DELTASONE) 20 MG tablet Take 2 tablets (40 mg total) by mouth daily with breakfast for 5 days. 12/30/22 01/04/23 Yes Rinaldo Ratel, [place] N, FNP  abacavir-dolutegravir-lamiVUDine (TRIUMEQ) 600-50-300 MG tablet TAKE 1 TABLET BY MOUTH DAILY 11/17/22   Ginnie Smart, MD  albuterol (VENTOLIN HFA) 108 (90 Base) MCG/ACT inhaler Inhale 2 puffs every 4-6 hours as as needed for cough, wheeze, tightness in chest, or shortness of breath 08/03/22   Nehemiah Settle, FNP  amLODipine (NORVASC) 10 MG tablet TAKE 1 TABLET(10 MG) BY MOUTH DAILY FOR BLOOD PRESSURE 07/25/22   Doreene Nest, NP  Blood Glucose Monitoring Suppl (ONETOUCH VERIO) w/Device KIT USE AS DIRECTED TO TEST UP TO FOUR TIMES DAILY 02/19/21   [provider]  ciclopirox (PENLAC) 8 % solution APPLY 1 COAT TO TOENAIL EVERY DAY FOR  48 WEEKS. REMOVE WEEKLY WITH POLISH REMOVER 06/22/21   Freddie Breech, DPM  EPINEPHrine 0.3 mg/0.3 mL IJ SOAJ injection INJECT 0.3 MG IN THE MUSCLE AS NEEDED FOR ANAPHYLAXIS 08/03/22   Nehemiah Settle, FNP  esomeprazole (NEXIUM) 40 MG capsule TAKE ONE CAPSULE BY MOUTH ONCE DAILY AS DIRECTED Patient taking differently: Take 40 mg by mouth daily. 02/06/17   Kozlow, Alvira Philips, MD  famotidine (PEPCID) 20 MG tablet Take 1 tablet (20 mg total) by mouth daily for 14 days. Patient not taking:  Reported on 08/23/2022 08/12/22 08/26/22  Carlisle Beers, FNP  fexofenadine (ALLEGRA) 180 MG tablet Take 1 tablet (180 mg total) by mouth daily. 08/03/22   Nehemiah Settle, FNP  fluticasone Aleda Grana) 50 MCG/ACT nasal spray Use 1 to 2 sprays in each nostril once a day as needed for stuffy nose 08/03/22   Nehemiah Settle, FNP  fluticasone-salmeterol (ADVAIR HFA) [phone] MCG/ACT inhaler INHALE 2 PUFFS BY MOUTH TWICE DAILY 08/03/22   Nehemiah Settle, FNP  glipiZIDE (GLUCOTROL XL) 5 MG 24 hr tablet TAKE 1 TABLET(5 MG) BY MOUTH DAILY WITH BREAKFAST FOR DIABETES 12/23/22   Doreene Nest, NP  glucose blood (ONETOUCH ULTRA) test strip USE AS DIRECTED UP TO FOUR TIMES DAILY. 02/20/22   Doreene Nest, NP  glucose blood test strip Use as instructed to test up to 4 times a day DX E11.9 01/22/21   Doreene Nest, NP  ibuprofen (ADVIL) 800 MG tablet Take 800 mg by mouth 3 (three) times daily as needed. 01/20/21   [provider]  LANCETS MICRO THIN 33G MISC USE TO TEST BLOOD SUGAR ONCE A DAY 08/21/17   Quentin Angst, MD  levothyroxine (SYNTHROID) 150 MCG tablet TAKE 1 TABLET(150 MCG) BY MOUTH DAILY 08/25/22   Reather Littler, MD  metFORMIN (GLUCOPHAGE-XR) 500 MG 24 hr tablet TAKE 2 TABLETS BY MOUTH IN THE MORNING AND IN THE EVENING FOR DIABETES 07/25/22   Doreene Nest, NP  montelukast (SINGULAIR) 10 MG tablet TAKE 1 TABLET(10 MG) BY MOUTH DAILY 08/03/22   Nehemiah Settle, FNP  Multiple Vitamin (MULTIVITAMIN) tablet Take 1 tablet by mouth daily.    [provider]  olopatadine (PATANOL) 0.1 % ophthalmic solution Place 1 drop into both eyes 2 (two) times daily. 08/12/22   Carlisle Beers, FNP  ONE TOUCH ULTRA TEST test strip TEST BLOOD GLUCOSE THREE TIMES DAILY AS DIRECTED 10/12/18   Doreene Nest, NP  rosuvastatin (CRESTOR) 5 MG tablet TAKE 1 TABLET BY MOUTH EVERY DAY FOR CHOLESTEROL 07/29/22   Doreene Nest, NP  spironolactone (ALDACTONE) 25 MG tablet TAKE 1 TABLET BY  MOUTH EVERY DAY FOR BLOOD PRESSURE 10/20/22   Doreene Nest, NP  TRELEGY ELLIPTA 200-62.5-25 MCG/ACT AEPB Inhale 1 puff into the lungs daily. 09/14/22   [provider]    Family History Family History  Problem Relation Age of Onset   Pancreatic cancer Mother    Cancer Mother    Diabetes Paternal Grandmother    Thyroid disease Paternal Grandmother     Social History Social History   Tobacco Use   Smoking status: Never   Smokeless tobacco: Never  Vaping Use   Vaping Use: Never used  Substance Use Topics   Alcohol use: Yes    Alcohol/week: 0.0 standard drinks of alcohol    Comment: socially - less now   Drug use: No     Allergies   Lisinopril, Penicillins, Levaquin [levofloxacin in d5w], Tessalon [benzonatate], and Aspirin  Review of Systems Review of Systems  Eyes:  Positive for itching.  Skin:  Positive for rash.     Physical Exam Triage Vital Signs ED Triage Vitals  Enc Vitals Group     BP 12/30/22 1658 138/75     Pulse Rate 12/30/22 1658 99     Resp 12/30/22 1658 20     Temp 12/30/22 1658 98.9 F (37.2 C)     Temp Source 12/30/22 1658 Oral     SpO2 12/30/22 1658 96 %     Weight --      Height --      Head Circumference --      Peak Flow --      Pain Score 12/30/22 1656 0     Pain Loc --      Pain Edu? --      Excl. in GC? --    No data found.  Updated Vital Signs BP 138/75 (BP Location: Left Arm) Comment (BP Location): large cuff  Pulse 99   Temp 98.9 F (37.2 C) (Oral)   Resp 20   SpO2 96%   Visual Acuity Right Eye Distance:   Left Eye Distance:   Bilateral Distance:    Right Eye Near:   Left Eye Near:    Bilateral Near:     Physical Exam Vitals and nursing note reviewed.  Constitutional:      Appearance: Normal appearance.  HENT:     Head: Normocephalic and atraumatic.     Right Ear: External ear normal.     Left Ear: External ear normal.     Nose: Nose normal.     Mouth/Throat:     Mouth: Mucous membranes are  moist.     Pharynx: Oropharynx is clear.  Eyes:     Conjunctiva/sclera: Conjunctivae normal.  Cardiovascular:     Rate and Rhythm: Normal rate and regular rhythm.     Heart sounds: Normal heart sounds. No murmur heard. Pulmonary:     Effort: Pulmonary effort is normal. No respiratory distress.     Breath sounds: Normal breath sounds.  Musculoskeletal:        General: No swelling. Normal range of motion.     Cervical back: Normal range of motion.  Skin:    General: Skin is warm and dry.     Findings: Rash present. Rash is urticarial.          Comments: Erythematous urticarial rash to bilateral palms.  Upper eyelids with swelling.  Neurological:     General: No focal deficit present.     Mental Status: He is alert.  Psychiatric:        Mood and Affect: Mood normal.        Behavior: Behavior is cooperative.      UC Treatments / Results  Labs (all labs ordered are listed, but only abnormal results are displayed) Labs Reviewed - No data to display  EKG   Radiology No results found.  Procedures Procedures (including critical care time)  Medications Ordered in UC Medications  dexamethasone (DECADRON) injection 10 mg (10 mg Intramuscular Given 12/30/22 1715)    Initial Impression / Assessment and Plan / UC Course  I have reviewed the triage vital signs and the nursing notes.  Pertinent labs & imaging results that were available during my care of the patient were reviewed by me and considered in my medical decision making (see chart for details).  Vitals and triage reviewed, patient is hemodynamically stable.  Appears to  be having an allergic reaction with a erythematous urticarial rash to bilateral palms and upper eyelid swelling.  Lungs vesicular, able to speak in full sentences, oxygenation stable on room air, low concern for anaphylaxis or airway involvement. Given IM steroids and short burst for reaction. Benadryl and hydroxyzine PRN.  Given strict emergency return  precautions, patient does have an EpiPen if needed.  Plan of care, follow-up care and return precautions given, no questions at this time.     Final Clinical Impressions(s) / UC Diagnoses   Final diagnoses:  Allergic reaction, initial encounter     Discharge Instructions      It is unclear what is caused your allergic reaction.  We have given you IM steroids to help with your itching, swelling and rash.  Please start the oral steroids tomorrow with breakfast, these will transiently increase your blood sugar.  You can take 25 to 50 mg of Benadryl every 6-8 hours for itching.  Please attempt to avoid all of your dietary and environmental triggers. Do not hesitate to use your Epipen if you develop oral swelling, drooling, or trouble swallowing.  If you do use your EpiPen, please call 911 or go to the nearest emergency department for evaluation.   Follow-up with your primary care provider or your allergist regarding your allergy management.      ED Prescriptions     Medication Sig Dispense Auth. Provider   predniSONE (DELTASONE) 20 MG tablet Take 2 tablets (40 mg total) by mouth daily with breakfast for 5 days. 10 tablet Kyler Germer, [place] N, FNP      PDMP not reviewed this encounter.   Tine Mabee, [place] N, [place] 12/30/22 1721

## 2022-12-30 NOTE — Discharge Instructions (Addendum)
It is unclear what is caused your allergic reaction.  We have given you IM steroids to help with your itching, swelling and rash.  Please start the oral steroids tomorrow with breakfast, these will transiently increase your blood sugar.  You can take 25 to 50 mg of Benadryl every 6-8 hours for itching.  Please attempt to avoid all of your dietary and environmental triggers. Do not hesitate to use your Epipen if you develop oral swelling, drooling, or trouble swallowing.  If you do use your EpiPen, please call 911 or go to the nearest emergency department for evaluation.   Follow-up with your primary care provider or your allergist regarding your allergy management.

## 2023-01-02 ENCOUNTER — Ambulatory Visit (INDEPENDENT_AMBULATORY_CARE_PROVIDER_SITE_OTHER): Payer: BC Managed Care – PPO | Admitting: Primary Care

## 2023-01-02 ENCOUNTER — Encounter: Payer: Self-pay | Admitting: Primary Care

## 2023-01-02 VITALS — BP 136/84 | HR 110 | Temp 96.6°F | Ht 74.0 in | Wt 282.0 lb

## 2023-01-02 DIAGNOSIS — E1165 Type 2 diabetes mellitus with hyperglycemia: Secondary | ICD-10-CM

## 2023-01-02 DIAGNOSIS — T7840XS Allergy, unspecified, sequela: Secondary | ICD-10-CM | POA: Diagnosis not present

## 2023-01-02 DIAGNOSIS — Z7984 Long term (current) use of oral hypoglycemic drugs: Secondary | ICD-10-CM

## 2023-01-02 LAB — POCT GLYCOSYLATED HEMOGLOBIN (HGB A1C): Hemoglobin A1C: 10.2 % — AB (ref 4.0–5.6)

## 2023-01-02 NOTE — Assessment & Plan Note (Signed)
Uncontrolled and worse with A1C today of 10.2! This isn't too surprising given the multiple rounds of prednisone he's taken.  Increase Glipizide XL to 10 mg daily. He will take two of the XL 5 mg pills until his bottle is empty. He will notify when ready for XL 10 mg tablet. Continue metformin XR 1000 mg twice daily.  Continue work on diet, increase activity level.  Follow-up in 3 months.

## 2023-01-02 NOTE — Progress Notes (Signed)
Subjective:    Patient ID: Steven [name], male    DOB: 1981-05-29, 42 y.o.   MRN: [id number]  HPI  Steven [name] is a very pleasant 42 y.o. male with a history of hypertension, severe asthma, HIV, type 2 diabetes, CKD, lymphedema, recurrent allergic reactions, GERD, hyperlipidemia, hypokalemia, hypothyroidism who presents today for urgent follow-up and diabetes follow-up.  He is also needing his intermittent FMLA updated.  1) Recurrent Allergic Reaction: History of multiple prior allergic reactions.  He presented to urgent care 3 days ago for another allergic reaction that occurred 30 minutes prior to arrival.  Symptoms included itching hands, swelling to bilateral eyes with redness, tightness to his face.  He did not have to use his EpiPen.  He was treated with a dose of steroids intramuscularly and prescribed a short burst of prednisone orally. Today he's feeling better. His eye swelling and facial tightness has resolved.   He does follow with an allergist, last office visit was 4//2024, underwent lab testing.  During this visit his Allegra, Nexium, and Flonase was continued daily he is also on intermittent FMLA for allergic reactions requiring office visits or urgent care visits. He will resume his Xolair injections every 2 weeks.  He denies wheezing or shortness of breath.  2) Type 2 Diabetes:   Current medications include: Metformin XR 1000 mg twice daily, glipizide XL 5 mg daily. He has been on multiple rounds of prednisone this year for recurrent allergic reaction.   He is checking his blood glucose 1 time daily and is getting readings of:  AM fasting: 140's-160's  Last A1C: 7.4 in February 2024, 10.2 today. Last Eye Exam: Up-to-date Last Foot Exam: Up-to-date Pneumonia Vaccination: 2022 Urine Microalbumin: Up-to-date Statin: Rosuvastatin  Dietary changes since last visit: He has limited late night eating and snacking. He is increasing fruit and vegetables.    Exercise:  None  3) FMLA: Currently managed on intermittent FMLA for doctors visits, flares of allergic reaction requiring urgent care visits.   He is requesting continuous and intermittent FMLA. He missed four continuous days, June 19, 20, 21, 22. He resumed work on June 23rd. Evaluated at [facility] on 12/28/22 for chest pain and then at urgent care on 12/30/22.  The intermittent FMLA is for any missed days for ED/urgent care visits or doctors appointments.   BP Readings from Last 3 Encounters:  01/02/23 136/84  12/30/22 138/75  12/28/22 (!) 145/104     Review of Systems  HENT:  Negative for trouble swallowing.   Respiratory:  Negative for shortness of breath and wheezing.   Cardiovascular:  Negative for chest pain.  Neurological:  Negative for numbness.         Past Medical History:  Diagnosis Date   Asthma    Cancer (HCC)    Colitis 05/27/2011   Diabetes mellitus without complication (HCC)    HIV positive (HCC) 03/23/09   Genotype Y181C   HTN (hypertension)    Kaposi's sarcoma    Syphilis 03/23/09-   1:2    Social History   Socioeconomic History   Marital status: Single    Spouse name: Not on file   Number of children: Not on file   Years of education: Not on file   Highest education level: Not on file  Occupational History   Not on file  Tobacco Use   Smoking status: Never   Smokeless tobacco: Never  Vaping Use   Vaping Use: Never used  Substance and Sexual Activity  Alcohol use: Yes    Alcohol/week: 0.0 standard drinks of alcohol    Comment: socially - less now   Drug use: No   Sexual activity: Not Currently    Partners: Male    Birth control/protection: Condom    Comment: pt. declined condoms  Other Topics Concern   Not on file  Social History Narrative   Single.    No children.   Works in Clinical biochemist   Enjoys DJ, traveling.    Social Determinants of Health   Financial Resource Strain: Low Risk  (01/02/2023)   Overall Financial Resource Strain  (CARDIA)    Difficulty of Paying Living Expenses: Not very hard  Food Insecurity: Not on file  Transportation Needs: No Transportation Needs (01/02/2023)   PRAPARE - Administrator, Civil Service (Medical): No    Lack of Transportation (Non-Medical): No  Physical Activity: Insufficiently Active (01/02/2023)   Exercise Vital Sign    Days of Exercise per Week: 2 days    Minutes of Exercise per Session: 20 min  Stress: Patient Declined (01/02/2023)   [employer] of Occupational Health - Occupational Stress Questionnaire    Feeling of Stress : Patient declined  Social Connections: Unknown (01/02/2023)   Social Connection and Isolation Panel [NHANES]    Frequency of Communication with Friends and Family: Not on file    Frequency of Social Gatherings with Friends and Family: Not on file    Attends Religious Services: Not on file    Active Member of Clubs or Organizations: No    Attends Banker Meetings: Not on file    Marital Status: Not on file  Intimate Partner Violence: Not on file    Past Surgical History:  Procedure Laterality Date   BRAIN SURGERY     IR GENERIC HISTORICAL  02/12/2016   IR REMOVAL TUN ACCESS W/ PORT W/O FL MOD SED 02/12/2016 Richarda Overlie, MD [facility]    Family History  Problem Relation Age of Onset   Pancreatic cancer Mother    Cancer Mother    Diabetes Paternal Grandmother    Thyroid disease Paternal Grandmother     Allergies  Allergen Reactions   Lisinopril Swelling    Swelling of lower lip while on lisinopril   Penicillins Hives and Swelling    REACTION: rash and swelling   Levaquin [Levofloxacin In D5w] Rash   Tessalon [Benzonatate] Rash   Aspirin Nausea Only    Current Outpatient Medications on File Prior to Visit  Medication Sig Dispense Refill   abacavir-dolutegravir-lamiVUDine (TRIUMEQ) 600-50-300 MG tablet TAKE 1 TABLET BY MOUTH DAILY 90 tablet 3   albuterol (VENTOLIN HFA) 108 (90 Base) MCG/ACT inhaler Inhale 2  puffs every 4-6 hours as as needed for cough, wheeze, tightness in chest, or shortness of breath 18 g 1   amLODipine (NORVASC) 10 MG tablet TAKE 1 TABLET(10 MG) BY MOUTH DAILY FOR BLOOD PRESSURE 90 tablet 3   Blood Glucose Monitoring Suppl (ONETOUCH VERIO) w/Device KIT USE AS DIRECTED TO TEST UP TO FOUR TIMES DAILY     ciclopirox (PENLAC) 8 % solution APPLY 1 COAT TO TOENAIL EVERY DAY FOR 48 WEEKS. REMOVE WEEKLY WITH POLISH REMOVER 6.6 mL 11   EPINEPHrine 0.3 mg/0.3 mL IJ SOAJ injection INJECT 0.3 MG IN THE MUSCLE AS NEEDED FOR ANAPHYLAXIS 2 each 1   esomeprazole (NEXIUM) 40 MG capsule TAKE ONE CAPSULE BY MOUTH ONCE DAILY AS DIRECTED (Patient taking differently: Take 40 mg by mouth daily.) 30 capsule 0  fexofenadine (ALLEGRA) 180 MG tablet Take 1 tablet (180 mg total) by mouth daily. 15 tablet 0   fluticasone (FLONASE) 50 MCG/ACT nasal spray Use 1 to 2 sprays in each nostril once a day as needed for stuffy nose 16 g 5   fluticasone-salmeterol (ADVAIR HFA) 230-21 MCG/ACT inhaler INHALE 2 PUFFS BY MOUTH TWICE DAILY 12 g 5   glipiZIDE (GLUCOTROL XL) 5 MG 24 hr tablet TAKE 1 TABLET(5 MG) BY MOUTH DAILY WITH BREAKFAST FOR DIABETES 90 tablet 0   glucose blood (ONETOUCH ULTRA) test strip USE AS DIRECTED UP TO FOUR TIMES DAILY. 300 strip 3   glucose blood test strip Use as instructed to test up to 4 times a day DX E11.9 100 each 3   ibuprofen (ADVIL) 800 MG tablet Take 800 mg by mouth 3 (three) times daily as needed.     LANCETS MICRO THIN 33G MISC USE TO TEST BLOOD SUGAR ONCE A DAY 100 each 2   levothyroxine (SYNTHROID) 150 MCG tablet TAKE 1 TABLET(150 MCG) BY MOUTH DAILY 90 tablet 1   metFORMIN (GLUCOPHAGE-XR) 500 MG 24 hr tablet TAKE 2 TABLETS BY MOUTH IN THE MORNING AND IN THE EVENING FOR DIABETES 360 tablet 1   montelukast (SINGULAIR) 10 MG tablet TAKE 1 TABLET(10 MG) BY MOUTH DAILY 90 tablet 1   Multiple Vitamin (MULTIVITAMIN) tablet Take 1 tablet by mouth daily.     olopatadine (PATANOL) 0.1 %  ophthalmic solution Place 1 drop into both eyes 2 (two) times daily. 5 mL 0   ONE TOUCH ULTRA TEST test strip TEST BLOOD GLUCOSE THREE TIMES DAILY AS DIRECTED 100 each 2   rosuvastatin (CRESTOR) 5 MG tablet TAKE 1 TABLET BY MOUTH EVERY DAY FOR CHOLESTEROL 90 tablet 2   spironolactone (ALDACTONE) 25 MG tablet TAKE 1 TABLET BY MOUTH EVERY DAY FOR BLOOD PRESSURE 90 tablet 0   TRELEGY ELLIPTA 200-62.5-25 MCG/ACT AEPB Inhale 1 puff into the lungs daily.     famotidine (PEPCID) 20 MG tablet Take 1 tablet (20 mg total) by mouth daily for 14 days. (Patient not taking: Reported on 08/23/2022) 30 tablet 0   predniSONE (DELTASONE) 20 MG tablet Take 2 tablets (40 mg total) by mouth daily with breakfast for 5 days. (Patient not taking: Reported on 01/02/2023) 10 tablet 0   Current Facility-Administered Medications on File Prior to Visit  Medication Dose Route Frequency Provider Last Rate Last Admin   omalizumab Geoffry Paradise) injection 300 mg  300 mg Subcutaneous Q14 Days Wyline Mood M, DO   300 mg at 10/12/22 1236    BP 136/84   Pulse (!) 110   Temp (!) 96.6 F (35.9 C) (Temporal)   Ht 6\' 2"  (1.88 m)   Wt 282 lb (127.9 kg)   SpO2 98%   BMI 36.21 kg/m  Objective:   Physical Exam Cardiovascular:     Rate and Rhythm: Normal rate and regular rhythm.  Pulmonary:     Effort: Pulmonary effort is normal.     Breath sounds: Normal breath sounds. No wheezing or rales.  Musculoskeletal:     Cervical back: Neck supple.  Skin:    General: Skin is warm and dry.  Neurological:     Mental Status: He is alert and oriented to person, place, and time.           Assessment & Plan:  Type 2 diabetes mellitus with hyperglycemia, without long-term current use of insulin (HCC) Assessment & Plan: Uncontrolled and worse with A1C today of 10.2! This  isn't too surprising given the multiple rounds of prednisone he's taken.  Increase Glipizide XL to 10 mg daily. He will take two of the XL 5 mg pills until his bottle is  empty. He will notify when ready for XL 10 mg tablet. Continue metformin XR 1000 mg twice daily.  Continue work on diet, increase activity level.  Follow-up in 3 months.    Orders: -     POCT glycosylated hemoglobin (Hb A1C)  Allergic reaction, sequela Assessment & Plan: Recurrent with recent urgent care visit. Urgent care notes reviewed.  Agree to provide continuous FMLA for the ED visit from 12/28/2022. Agree to provide intermittent FMLA for his ongoing allergic reactions and multiple doctors appointments given his medical history.         Doreene Nest, NP

## 2023-01-02 NOTE — Assessment & Plan Note (Signed)
Recurrent with recent urgent care visit. Urgent care notes reviewed.  Agree to provide continuous FMLA for the ED visit from 12/28/2022. Agree to provide intermittent FMLA for his ongoing allergic reactions and multiple doctors appointments given his medical history.

## 2023-01-02 NOTE — Patient Instructions (Signed)
We increased the dose of your glipizide to 10 mg.  You may take 2 tablets of your 5 mg dose to equal 10 mg.  Please notify me when you are running low.  Continue to work on [employer]. Continue to work on physical exercise.  Follow-up with the allergist as scheduled.  Please schedule a follow up visit for 3 months.  It was a pleasure to see you today!

## 2023-01-07 DIAGNOSIS — R21 Rash and other nonspecific skin eruption: Secondary | ICD-10-CM | POA: Diagnosis not present

## 2023-01-07 DIAGNOSIS — T7840XA Allergy, unspecified, initial encounter: Secondary | ICD-10-CM | POA: Diagnosis not present

## 2023-01-18 ENCOUNTER — Ambulatory Visit: Payer: BC Managed Care – PPO | Admitting: Primary Care

## 2023-01-23 ENCOUNTER — Ambulatory Visit: Payer: BC Managed Care – PPO

## 2023-01-25 ENCOUNTER — Other Ambulatory Visit: Payer: Self-pay | Admitting: Infectious Diseases

## 2023-01-25 DIAGNOSIS — B20 Human immunodeficiency virus [HIV] disease: Secondary | ICD-10-CM

## 2023-01-25 NOTE — Telephone Encounter (Addendum)
Pt stated he changed from [facility] pharmacy to [facility]. And he needs a refill. Last rx was sent to [facility] on 11/17/22.

## 2023-01-26 MED ORDER — TRIUMEQ 600-50-300 MG PO TABS: 1.0000 | ORAL_TABLET | Freq: Every day | ORAL | 3 refills | Status: DC

## 2023-01-27 ENCOUNTER — Other Ambulatory Visit: Payer: Self-pay | Admitting: Family

## 2023-01-30 ENCOUNTER — Ambulatory Visit: Payer: BC Managed Care – PPO

## 2023-01-30 ENCOUNTER — Other Ambulatory Visit: Payer: Self-pay | Admitting: Primary Care

## 2023-01-30 DIAGNOSIS — I10 Essential (primary) hypertension: Secondary | ICD-10-CM

## 2023-02-01 DIAGNOSIS — L299 Pruritus, unspecified: Secondary | ICD-10-CM | POA: Diagnosis not present

## 2023-02-02 ENCOUNTER — Telehealth: Payer: Self-pay

## 2023-02-02 NOTE — Telephone Encounter (Signed)
Patient has been off of his Xolair injections since 06/13/22.  He came for an office visit 4/3/24and was to get an injection on 01/30/23. Please advise on what he need to do to restart injections.

## 2023-02-07 NOTE — Telephone Encounter (Signed)
L/m for patient to reach out to me to discuss Xolair restart

## 2023-02-09 ENCOUNTER — Other Ambulatory Visit: Payer: Self-pay

## 2023-02-09 ENCOUNTER — Emergency Department (HOSPITAL_COMMUNITY)
Admission: EM | Admit: 2023-02-09 | Discharge: 2023-02-10 | Disposition: A | Discharge status: Home / Self Care | Payer: BC Managed Care – PPO | Attending: Emergency Medicine | Admitting: Emergency Medicine

## 2023-02-09 ENCOUNTER — Emergency Department (HOSPITAL_COMMUNITY): Payer: BC Managed Care – PPO

## 2023-02-09 ENCOUNTER — Encounter (HOSPITAL_COMMUNITY): Payer: Self-pay

## 2023-02-09 DIAGNOSIS — R42 Dizziness and giddiness: Secondary | ICD-10-CM | POA: Insufficient documentation

## 2023-02-09 DIAGNOSIS — J45909 Unspecified asthma, uncomplicated: Secondary | ICD-10-CM | POA: Insufficient documentation

## 2023-02-09 DIAGNOSIS — R0602 Shortness of breath: Secondary | ICD-10-CM | POA: Diagnosis not present

## 2023-02-09 DIAGNOSIS — I10 Essential (primary) hypertension: Secondary | ICD-10-CM | POA: Diagnosis not present

## 2023-02-09 DIAGNOSIS — R072 Precordial pain: Secondary | ICD-10-CM | POA: Insufficient documentation

## 2023-02-09 DIAGNOSIS — Z7984 Long term (current) use of oral hypoglycemic drugs: Secondary | ICD-10-CM | POA: Insufficient documentation

## 2023-02-09 DIAGNOSIS — Z79899 Other long term (current) drug therapy: Secondary | ICD-10-CM | POA: Insufficient documentation

## 2023-02-09 DIAGNOSIS — Z859 Personal history of malignant neoplasm, unspecified: Secondary | ICD-10-CM | POA: Insufficient documentation

## 2023-02-09 DIAGNOSIS — Z21 Asymptomatic human immunodeficiency virus [HIV] infection status: Secondary | ICD-10-CM | POA: Insufficient documentation

## 2023-02-09 DIAGNOSIS — R002 Palpitations: Secondary | ICD-10-CM | POA: Insufficient documentation

## 2023-02-09 DIAGNOSIS — R079 Chest pain, unspecified: Secondary | ICD-10-CM | POA: Diagnosis not present

## 2023-02-09 DIAGNOSIS — E119 Type 2 diabetes mellitus without complications: Secondary | ICD-10-CM | POA: Diagnosis not present

## 2023-02-09 LAB — BASIC METABOLIC PANEL
Anion gap: 11 (ref 5–15)
BUN: 15 mg/dL (ref 6–20)
CO2: 25 mmol/L (ref 22–32)
Calcium: 9.4 mg/dL (ref 8.9–10.3)
Chloride: 103 mmol/L (ref 98–111)
Creatinine, Ser: 1.18 mg/dL (ref 0.61–1.24)
GFR, Estimated: >60 mL/min (ref >60)
Glucose, Bld: 129 mg/dL — ABNORMAL HIGH (ref 70–99)
Potassium: 3.5 mmol/L (ref 3.5–5.1)
Sodium: 139 mmol/L (ref 135–145)

## 2023-02-09 LAB — TROPONIN I (HIGH SENSITIVITY)
Troponin I (High Sensitivity): 2 ng/L (ref <18)
Troponin I (High Sensitivity): 3 ng/L (ref <18)

## 2023-02-09 LAB — CBC
HCT: 48.8 % (ref 39.0–52.0)
Hemoglobin: 15.1 g/dL (ref 13.0–17.0)
MCH: 25.5 pg — ABNORMAL LOW (ref 26.0–34.0)
MCHC: 30.9 g/dL (ref 30.0–36.0)
MCV: 82.4 fL (ref 80.0–100.0)
Platelets: 266 10*3/uL (ref 150–400)
RBC: 5.92 MIL/uL — ABNORMAL HIGH (ref 4.22–5.81)
RDW: 12.9 % (ref 11.5–15.5)
WBC: 6.2 10*3/uL (ref 4.0–10.5)
nRBC: 0 % (ref 0.0–0.2)

## 2023-02-09 NOTE — ED Triage Notes (Signed)
Pt complaining of intermittent chest pain and dizziness.Pt also states he has had a couple episodes in the last month of feeling like his heart is fluttering and SOB. States the chest pain and the fluttering do not occur at the same time. Denies any N/V, SOB at this time.

## 2023-02-10 NOTE — ED Provider Notes (Signed)
[place] EMERGENCY DEPARTMENT AT [facility] Provider Note   CSN: [id number] Arrival date & time: 02/09/23  1830     History  Chief Complaint  Patient presents with   Chest Pain    Steven [name] is a 42 y.o. male.  The history is provided by the patient.  Patient with history of HIV that is well-controlled, diabetes, hypertension presents with multiple complaints. Patient reports over the past 2-3 days she has had intermittent chest pain.  He also reports mild dizziness and feeling lightheaded.  No syncope.  He also reports having heart fluttering and palpitations.  He reports mild shortness of breath.  No fevers or vomiting.  He has been chest pain-free for hours.  Denies known history of CAD/VTE.  He is a non-smoker. The pain does not radiate into his back.  No focal weakness is reported   Past Medical History:  Diagnosis Date   Asthma    Cancer (HCC)    Colitis 05/27/2011   Diabetes mellitus without complication (HCC)    HIV positive (HCC) 03/23/09   Genotype Y181C   HTN (hypertension)    Kaposi's sarcoma    Syphilis 03/23/09-   1:2    Home Medications Prior to Admission medications   Medication Sig Start Date End Date Taking? Authorizing Provider  abacavir-dolutegravir-lamiVUDine (TRIUMEQ) 600-50-300 MG tablet Take 1 tablet by mouth daily. 01/26/23   Ginnie Smart, MD  albuterol (VENTOLIN HFA) 108 (90 Base) MCG/ACT inhaler Inhale 2 puffs every 4-6 hours as as needed for cough, wheeze, tightness in chest, or shortness of breath 08/03/22   Nehemiah Settle, FNP  amLODipine (NORVASC) 10 MG tablet TAKE 1 TABLET(10 MG) BY MOUTH DAILY FOR BLOOD PRESSURE 07/25/22   Doreene Nest, NP  Blood Glucose Monitoring Suppl (ONETOUCH VERIO) w/Device KIT USE AS DIRECTED TO TEST UP TO FOUR TIMES DAILY 02/19/21   [provider]  ciclopirox (PENLAC) 8 % solution APPLY 1 COAT TO TOENAIL EVERY DAY FOR 48 WEEKS. REMOVE WEEKLY WITH POLISH REMOVER 06/22/21   Freddie Breech, DPM  EPINEPHrine 0.3 mg/0.3 mL IJ SOAJ injection INJECT 0.3 MG IN THE MUSCLE AS NEEDED FOR ANAPHYLAXIS 08/03/22   Nehemiah Settle, FNP  esomeprazole (NEXIUM) 40 MG capsule TAKE ONE CAPSULE BY MOUTH ONCE DAILY AS DIRECTED Patient taking differently: Take 40 mg by mouth daily. 02/06/17   Kozlow, Alvira Philips, MD  famotidine (PEPCID) 20 MG tablet Take 1 tablet (20 mg total) by mouth daily for 14 days. Patient not taking: Reported on 08/23/2022 08/12/22 08/26/22  Carlisle Beers, FNP  fexofenadine (ALLEGRA) 180 MG tablet Take 1 tablet (180 mg total) by mouth daily. 08/03/22   Nehemiah Settle, FNP  fluticasone (FLONASE) 50 MCG/ACT nasal spray SHAKE WELL USE 1 OR 2 SPRAYS IN EACH NOSTRIL EVERYDAY FOR NASAL CONGESTION 01/27/23   Nehemiah Settle, FNP  fluticasone-salmeterol (ADVAIR HFA) [phone] MCG/ACT inhaler INHALE 2 PUFFS BY MOUTH TWICE DAILY 08/03/22   Nehemiah Settle, FNP  glipiZIDE (GLUCOTROL XL) 5 MG 24 hr tablet TAKE 1 TABLET(5 MG) BY MOUTH DAILY WITH BREAKFAST FOR DIABETES 12/23/22   Doreene Nest, NP  glucose blood (ONETOUCH ULTRA) test strip USE AS DIRECTED UP TO FOUR TIMES DAILY. 02/20/22   Doreene Nest, NP  glucose blood test strip Use as instructed to test up to 4 times a day DX E11.9 01/22/21   Doreene Nest, NP  ibuprofen (ADVIL) 800 MG tablet Take 800 mg by mouth 3 (three) times daily as needed.  01/20/21   [provider]  LANCETS MICRO THIN 33G MISC USE TO TEST BLOOD SUGAR ONCE A DAY 08/21/17   Quentin Angst, MD  levothyroxine (SYNTHROID) 150 MCG tablet TAKE 1 TABLET(150 MCG) BY MOUTH DAILY 08/25/22   Reather Littler, MD  metFORMIN (GLUCOPHAGE-XR) 500 MG 24 hr tablet TAKE 2 TABLETS BY MOUTH IN THE MORNING AND IN THE EVENING FOR DIABETES 07/25/22   Doreene Nest, NP  montelukast (SINGULAIR) 10 MG tablet TAKE 1 TABLET(10 MG) BY MOUTH DAILY 08/03/22   Nehemiah Settle, FNP  Multiple Vitamin (MULTIVITAMIN) tablet Take 1 tablet by mouth daily.    [provider]  olopatadine (PATANOL) 0.1 % ophthalmic solution Place 1 drop into both eyes 2 (two) times daily. 08/12/22   Carlisle Beers, FNP  ONE TOUCH ULTRA TEST test strip TEST BLOOD GLUCOSE THREE TIMES DAILY AS DIRECTED 10/12/18   Doreene Nest, NP  rosuvastatin (CRESTOR) 5 MG tablet TAKE 1 TABLET BY MOUTH EVERY DAY FOR CHOLESTEROL 07/29/22   Doreene Nest, NP  spironolactone (ALDACTONE) 25 MG tablet TAKE 1 TABLET BY MOUTH EVERY DAY FOR BLOOD PRESSURE 01/30/23   Doreene Nest, NP  TRELEGY ELLIPTA 200-62.5-25 MCG/ACT AEPB Inhale 1 puff into the lungs daily. 09/14/22   [provider]      Allergies    Lisinopril, Penicillins, Levaquin [levofloxacin in d5w], Tessalon [benzonatate], and Aspirin    Review of Systems   Review of Systems  Constitutional:  Negative for fever.  Cardiovascular:  Positive for chest pain.  Neurological:  Positive for light-headedness. Negative for syncope and weakness.    Physical Exam Updated Vital Signs BP (!) 138/97 (BP Location: Left Arm)   Pulse 88   Temp 98.1 F (36.7 C) (Oral)   Resp 18   Ht 1.88 m (6\' 2" )   Wt 130.2 kg   SpO2 98%   BMI 36.85 kg/m  Physical Exam CONSTITUTIONAL: Well developed/well nourished HEAD: Normocephalic/atraumatic ENMT: Mucous membranes moist NECK: supple no meningeal signs CV: S1/S2 noted, no murmurs/rubs/gallops noted LUNGS: Lungs are clear to auscultation bilaterally, no apparent distress ABDOMEN: soft, nontender, no rebound or guarding, bowel sounds noted throughout abdomen GU:no cva tenderness NEURO: Pt is awake/alert/appropriate, moves all extremitiesx4.  No facial droop.   EXTREMITIES: pulses normal/equalx4, full ROM No lower extremity edema or tenderness SKIN: warm, color normal PSYCH: no abnormalities of mood noted, alert and oriented to situation  ED Results / Procedures / Treatments   Labs (all labs ordered are listed, but only abnormal results are displayed) Labs Reviewed  BASIC  METABOLIC PANEL - Abnormal; Notable for the following components:      Result Value   Glucose, Bld 129 (*)    All other components within normal limits  CBC - Abnormal; Notable for the following components:   RBC 5.92 (*)    MCH 25.5 (*)    All other components within normal limits  TROPONIN I (HIGH SENSITIVITY)  TROPONIN I (HIGH SENSITIVITY)    EKG EKG Interpretation Date/Time:  Thursday February 09 2023 23:58:53 EDT Ventricular Rate:  65 PR Interval:  157 QRS Duration:  81 QT Interval:  386 QTC Calculation: 402 R Axis:   59  Text Interpretation: Sinus rhythm No significant change since last tracing Confirmed by Zadie Rhine ([id number]) on 02/10/2023 12:15:06 AM  Radiology DG Chest 2 View  Result Date: 02/09/2023 CLINICAL DATA:  Chest pain EXAM: CHEST - 2 VIEW COMPARISON:  X-ray 12/28/2022 FINDINGS: No consolidation, pneumothorax or effusion.  Normal cardiopericardial silhouette without edema. Underinflation. Old left-sided rib fractures. Overlapping cardiac leads. IMPRESSION: No acute cardiopulmonary disease. Electronically Signed   By: Karen Kays M.D.   On: 02/09/2023 19:20    Procedures Procedures    Medications Ordered in ED Medications - No data to display  ED Course/ Medical Decision Making/ A&P              HEART Score: 2                    Medical Decision Making Amount and/or Complexity of Data Reviewed Labs: ordered. Radiology: ordered.   This patient presents to the ED for concern of chest pain, this involves an extensive number of treatment options, and is a complaint that carries with it a high risk of complications and morbidity.  The differential diagnosis includes but is not limited to acute coronary syndrome, aortic dissection, pulmonary embolism, pericarditis, pneumothorax, pneumonia, myocarditis, pleurisy, esophageal rupture    Comorbidities that complicate the patient evaluation: Patient's presentation is complicated by their history of HIV,  diabetes, HTN  Additional history obtained: Records reviewed Primary Care Documents  Lab Tests: I Ordered, and personally interpreted labs.  The pertinent results include: Labs overall unremarkable  Imaging Studies ordered: I ordered imaging studies including X-ray chest   I independently visualized and interpreted imaging which showed no acute findings I agree with the radiologist interpretation  Cardiac Monitoring: The patient was maintained on a cardiac monitor.  I personally viewed and interpreted the cardiac monitor which showed an underlying rhythm of:  sinus rhythm   Test Considered: Patient is low risk / negative by heart score, therefore do not feel that cardiac workup is indicated.  Complexity of problems addressed: Patient's presentation is most consistent with  acute presentation with potential threat to life or bodily function  Disposition: After consideration of the diagnostic results and the patient's response to treatment,  I feel that the patent would benefit from discharge   .   Patient very well-appearing.  He has been chest pain-free for hours.  Reports he has had lightheadedness and palpitations.  At this time he is in sinus rhythm. Will recommend close follow-up with PCP and may need to wear outpatient cardiac monitoring.  At this point I have low suspicion for ACS/PE/dissection.       Final Clinical Impression(s) / ED Diagnoses Final diagnoses:  Precordial pain  Palpitations    Rx / DC Orders ED Discharge Orders     None         Zadie Rhine, MD 02/10/23 [phone]

## 2023-02-13 ENCOUNTER — Other Ambulatory Visit (INDEPENDENT_AMBULATORY_CARE_PROVIDER_SITE_OTHER): Payer: BC Managed Care – PPO

## 2023-02-13 ENCOUNTER — Other Ambulatory Visit: Payer: Self-pay

## 2023-02-13 ENCOUNTER — Other Ambulatory Visit: Payer: Self-pay | Admitting: Endocrinology

## 2023-02-13 DIAGNOSIS — E1165 Type 2 diabetes mellitus with hyperglycemia: Secondary | ICD-10-CM

## 2023-02-13 DIAGNOSIS — E890 Postprocedural hypothyroidism: Secondary | ICD-10-CM | POA: Diagnosis not present

## 2023-02-13 LAB — TSH: TSH: 0.66 u[IU]/mL (ref 0.35–5.50)

## 2023-02-13 LAB — T4, FREE: Free T4: 1.32 ng/dL (ref 0.60–1.60)

## 2023-02-13 MED ORDER — GLIPIZIDE ER 10 MG PO TB24
10.0000 mg | ORAL_TABLET | Freq: Every day | ORAL | 0 refills | Status: AC
Start: 2023-02-13 — End: ?

## 2023-02-14 ENCOUNTER — Other Ambulatory Visit: Payer: Self-pay | Admitting: Primary Care

## 2023-02-14 DIAGNOSIS — E119 Type 2 diabetes mellitus without complications: Secondary | ICD-10-CM

## 2023-02-14 DIAGNOSIS — E785 Hyperlipidemia, unspecified: Secondary | ICD-10-CM

## 2023-02-15 NOTE — Telephone Encounter (Signed)
Spoke to patient and advised him will submit to [employer] once he calls the copay card program to get his info. I tried but they advised patient had to call in to verify info with him

## 2023-02-16 ENCOUNTER — Ambulatory Visit: Payer: BC Managed Care – PPO

## 2023-02-16 ENCOUNTER — Ambulatory Visit: Payer: BC Managed Care – PPO | Admitting: Endocrinology

## 2023-02-17 DIAGNOSIS — T7840XA Allergy, unspecified, initial encounter: Secondary | ICD-10-CM | POA: Diagnosis not present

## 2023-02-17 DIAGNOSIS — L299 Pruritus, unspecified: Secondary | ICD-10-CM | POA: Diagnosis not present

## 2023-02-20 ENCOUNTER — Ambulatory Visit: Payer: BC Managed Care – PPO

## 2023-02-20 NOTE — Telephone Encounter (Signed)
Rx submitted to [employer] with copay card info

## 2023-02-23 ENCOUNTER — Encounter (INDEPENDENT_AMBULATORY_CARE_PROVIDER_SITE_OTHER): Payer: Self-pay

## 2023-02-24 ENCOUNTER — Encounter: Payer: Self-pay | Admitting: Endocrinology

## 2023-02-24 ENCOUNTER — Ambulatory Visit: Payer: BC Managed Care – PPO | Admitting: Endocrinology

## 2023-02-24 VITALS — BP 126/74 | HR 68 | Ht 74.0 in | Wt 288.0 lb

## 2023-02-24 DIAGNOSIS — E890 Postprocedural hypothyroidism: Secondary | ICD-10-CM

## 2023-02-24 DIAGNOSIS — Z8639 Personal history of other endocrine, nutritional and metabolic disease: Secondary | ICD-10-CM

## 2023-02-24 MED ORDER — LEVOTHYROXINE SODIUM 150 MCG PO TABS
150.0000 ug | ORAL_TABLET | Freq: Every day | ORAL | 4 refills | Status: DC
Start: 2023-02-24 — End: 2024-02-13

## 2023-02-24 MED ORDER — LEVOTHYROXINE SODIUM 150 MCG PO TABS
150.0000 ug | ORAL_TABLET | Freq: Every day | ORAL | 4 refills | Status: DC
Start: 2023-02-24 — End: 2023-02-24

## 2023-02-24 NOTE — Progress Notes (Signed)
Outpatient Endocrinology Note [place] Neftali Thurow, MD  02/24/23  Patient's Name: Steven [name]    DOB: 01-30-1981    MRN: [id number]  REASON OF VISIT: Follow-up for hypothyroidism  PCP: Doreene Nest, NP  HISTORY OF PRESENT ILLNESS:   Steven [name] is a 42 y.o. old male with past medical history as listed below is presented for a follow up of postablative hypothyroidism.   Pertinent Thyroid History: Patient had Graves' disease status post reactive iodine ablation in November 2012 and developed hypothyroidism.  He has been mostly on levothyroxine 150 or 200 mcg daily in last few years.  He has type 2 diabetes mellitus currently on glipizide and metformin, managed by primary care provider.  Interval history 02/24/23 Patient has been taking levothyroxine 150 mcg daily and additional half tablet once a week.  He takes in the morning before breakfast at least 30 to 60 minutes in empty stomach.  He reports compliance.  He denies fatigue, cold or heat intolerance.  No change in bowel habit.  He had recent thyroid lab normal as follows.   Latest Reference Range & Units 02/13/23 08:26  TSH 0.35 - 5.50 uIU/mL 0.66  T4,Free(Direct) 0.60 - 1.60 ng/dL 1.91   Uncontrolled type 2 diabetes mellitus, noted that glipizide was increased by primary care provider and had been on prednisone prior to that, patient reports that his blood sugar are improving.  Discussed that endocrinology will be available if needed for the management of type 2 diabetes mellitus.  At this time patient will continue to follow-up with primary care provider.  REVIEW OF SYSTEMS:  As per history of present illness.   PAST MEDICAL HISTORY: Past Medical History:  Diagnosis Date   Asthma    Cancer (HCC)    Colitis 05/27/2011   Diabetes mellitus without complication (HCC)    HIV positive (HCC) 03/23/09   Genotype Y181C   HTN (hypertension)    Kaposi's sarcoma    Syphilis 03/23/09-   1:2    PAST SURGICAL HISTORY: Past  Surgical History:  Procedure Laterality Date   BRAIN SURGERY     IR GENERIC HISTORICAL  02/12/2016   IR REMOVAL TUN ACCESS W/ PORT W/O FL MOD SED 02/12/2016 Richarda Overlie, MD [facility]-INTERV RAD    ALLERGIES: Allergies  Allergen Reactions   Lisinopril Swelling    Swelling of lower lip while on lisinopril   Penicillins Hives and Swelling    REACTION: rash and swelling   Levaquin [Levofloxacin In D5w] Rash   Tessalon [Benzonatate] Rash   Aspirin Nausea Only    FAMILY HISTORY:  Family History  Problem Relation Age of Onset   Pancreatic cancer Mother    Cancer Mother    Diabetes Paternal Grandmother    Thyroid disease Paternal Grandmother     SOCIAL HISTORY: Social History   Socioeconomic History   Marital status: Single    Spouse name: Not on file   Number of children: Not on file   Years of education: Not on file   Highest education level: Not on file  Occupational History   Not on file  Tobacco Use   Smoking status: Never   Smokeless tobacco: Never  Vaping Use   Vaping status: Never Used  Substance and Sexual Activity   Alcohol use: Yes    Alcohol/week: 0.0 standard drinks of alcohol    Comment: socially - less now   Drug use: No   Sexual activity: Not Currently    Partners: Male    Birth control/protection:  Condom    Comment: pt. declined condoms  Other Topics Concern   Not on file  Social History Narrative   Single.    No children.   Works in Clinical biochemist   Enjoys DJ, traveling.    Social Determinants of Health   Financial Resource Strain: Low Risk  (01/02/2023)   Overall Financial Resource Strain (CARDIA)    Difficulty of Paying Living Expenses: Not very hard  Food Insecurity: Not on file  Transportation Needs: No Transportation Needs (01/02/2023)   PRAPARE - Administrator, Civil Service (Medical): No    Lack of Transportation (Non-Medical): No  Physical Activity: Insufficiently Active (01/02/2023)   Exercise Vital Sign    Days of Exercise per  Week: 2 days    Minutes of Exercise per Session: 20 min  Stress: Patient Declined (01/02/2023)   [employer] of Occupational Health - Occupational Stress Questionnaire    Feeling of Stress : Patient declined  Social Connections: Unknown (01/02/2023)   Social Connection and Isolation Panel [NHANES]    Frequency of Communication with Friends and Family: Not on file    Frequency of Social Gatherings with Friends and Family: Not on file    Attends Religious Services: Not on file    Active Member of Clubs or Organizations: No    Attends Banker Meetings: Not on file    Marital Status: Not on file    MEDICATIONS:  Current Outpatient Medications  Medication Sig Dispense Refill   abacavir-dolutegravir-lamiVUDine (TRIUMEQ) 600-50-300 MG tablet Take 1 tablet by mouth daily. 90 tablet 3   albuterol (VENTOLIN HFA) 108 (90 Base) MCG/ACT inhaler Inhale 2 puffs every 4-6 hours as as needed for cough, wheeze, tightness in chest, or shortness of breath 18 g 1   amLODipine (NORVASC) 10 MG tablet TAKE 1 TABLET(10 MG) BY MOUTH DAILY FOR BLOOD PRESSURE 90 tablet 3   Blood Glucose Monitoring Suppl (ONETOUCH VERIO) w/Device KIT USE AS DIRECTED TO TEST UP TO FOUR TIMES DAILY     ciclopirox (PENLAC) 8 % solution APPLY 1 COAT TO TOENAIL EVERY DAY FOR 48 WEEKS. REMOVE WEEKLY WITH POLISH REMOVER 6.6 mL 11   EPINEPHrine 0.3 mg/0.3 mL IJ SOAJ injection INJECT 0.3 MG IN THE MUSCLE AS NEEDED FOR ANAPHYLAXIS 2 each 1   esomeprazole (NEXIUM) 40 MG capsule TAKE ONE CAPSULE BY MOUTH ONCE DAILY AS DIRECTED (Patient taking differently: Take 40 mg by mouth daily.) 30 capsule 0   fexofenadine (ALLEGRA) 180 MG tablet Take 1 tablet (180 mg total) by mouth daily. 15 tablet 0   fluticasone (FLONASE) 50 MCG/ACT nasal spray SHAKE WELL USE 1 OR 2 SPRAYS IN EACH NOSTRIL EVERYDAY FOR NASAL CONGESTION 16 mL 5   fluticasone-salmeterol (ADVAIR HFA) 230-21 MCG/ACT inhaler INHALE 2 PUFFS BY MOUTH TWICE DAILY 12 g 5    glipiZIDE (GLUCOTROL XL) 10 MG 24 hr tablet Take 1 tablet (10 mg total) by mouth daily with breakfast. for diabetes. 90 tablet 0   glucose blood (ONETOUCH ULTRA) test strip USE AS DIRECTED UP TO FOUR TIMES DAILY. 300 strip 3   glucose blood test strip Use as instructed to test up to 4 times a day DX E11.9 100 each 3   ibuprofen (ADVIL) 800 MG tablet Take 800 mg by mouth 3 (three) times daily as needed.     LANCETS MICRO THIN 33G MISC USE TO TEST BLOOD SUGAR ONCE A DAY 100 each 2   metFORMIN (GLUCOPHAGE-XR) 500 MG 24 hr tablet TAKE 2  TABLETS BY MOUTH IN THE MORNING AND IN THE EVENING FOR DIABETES 360 tablet 0   montelukast (SINGULAIR) 10 MG tablet TAKE 1 TABLET(10 MG) BY MOUTH DAILY 90 tablet 1   Multiple Vitamin (MULTIVITAMIN) tablet Take 1 tablet by mouth daily.     olopatadine (PATANOL) 0.1 % ophthalmic solution Place 1 drop into both eyes 2 (two) times daily. 5 mL 0   ONE TOUCH ULTRA TEST test strip TEST BLOOD GLUCOSE THREE TIMES DAILY AS DIRECTED 100 each 2   rosuvastatin (CRESTOR) 5 MG tablet TAKE 1 TAB EVERY DAY BY MOUTH FOR CHOLESTEROL 90 tablet 0   spironolactone (ALDACTONE) 25 MG tablet TAKE 1 TABLET BY MOUTH EVERY DAY FOR BLOOD PRESSURE 90 tablet 0   TRELEGY ELLIPTA 200-62.5-25 MCG/ACT AEPB Inhale 1 puff into the lungs daily.     levothyroxine (SYNTHROID) 150 MCG tablet Take 1 tablet (150 mcg total) by mouth daily before breakfast. And additional 1/2 tab once a week. 92 tablet 4   Current Facility-Administered Medications  Medication Dose Route Frequency Provider Last Rate Last Admin   omalizumab Geoffry Paradise) injection 300 mg  300 mg Subcutaneous Q14 Days Ellamae Sia, DO   300 mg at 10/12/22 1236    PHYSICAL EXAM: Vitals:   02/24/23 0758  BP: 126/74  Pulse: 68  SpO2: 98%  Weight: 288 lb (130.6 kg)  Height: 6\' 2"  (1.88 m)   Body mass index is 36.98 kg/m.   General: Well developed, well nourished male in no apparent distress.  HEENT: AT/[place], no external lesions. Hearing intact to  the spoken word Eyes: Conjunctiva clear and no icterus. Neck: Trachea midline, neck supple  Abdomen: Soft Neurologic: Alert, oriented, normal speech, deep tendon biceps reflexes normal,  no gross focal neurological deficit Extremities: no tremors of outstretched hands Skin: Warm, color good.  Psychiatric: Does not appear depressed or anxious  PERTINENT HISTORIC LABORATORY AND IMAGING STUDIES:  All pertinent laboratory results were reviewed. Please see HPI also for further details.   TSH  Date Value Ref Range Status  02/13/2023 0.66 0.35 - 5.50 uIU/mL Final  08/23/2022 2.19 0.35 - 5.50 uIU/mL Final  05/19/2022 6.046 (H) 0.350 - 4.500 uIU/mL Final    Comment:    Performed by a 3rd Generation assay with a functional sensitivity of <=0.01 uIU/mL. Performed at [facility], [address] [place], [place] [address]      ASSESSMENT / PLAN  1. H/O Graves' disease   2. Hypothyroidism, postradioiodine therapy    Patient had a history of Graves' disease status post radioactive iodine ablation in 2012 and developed postablative hypothyroidism.  -Patient is currently taking levothyroxine 150 mcg daily and half tab once a week.   We discussed the medical need for compliance with levothyroxine therapy, that it is a hormone necessary for life, and that serious consequences may result from noncompliance. Discussed the proper method of levothyroxine administration: take on an empty stomach in the morning, with water, waiting thirty to sixty minutes before taking any other beverages or food. Also reviewed the need to take calcium or iron supplements or multivitamin (that may contain iron or calcium) at least 4 hours after levothyroxine administration.  -Thyroid lab normal.  Plan: -Continue levothyroxine 150 mcg daily and additional half tablet once a week with total of 7 and half tablet per week. -Annual follow-up with annual thyroid lab.  Zekai was seen today for  follow-up.  Diagnoses and all orders for this visit:  H/O Graves' disease  Hypothyroidism,  postradioiodine therapy -     Discontinue: levothyroxine (SYNTHROID) 150 MCG tablet; Take 1 tablet (150 mcg total) by mouth daily before breakfast. -     levothyroxine (SYNTHROID) 150 MCG tablet; Take 1 tablet (150 mcg total) by mouth daily before breakfast. And additional 1/2 tab once a week.   DISPOSITION Follow up in clinic in 12 months suggested.  All questions answered and patient verbalized understanding of the plan.  [place] Kiondra Caicedo, MD [facility] Endocrinology [facility] [address] [place], [address] [place], [place] [address] Phone # [phone]  At least part of this note was generated using voice recognition software. Inadvertent word errors may have occurred, which were not recognized during the proofreading process.

## 2023-02-27 ENCOUNTER — Other Ambulatory Visit: Payer: Self-pay

## 2023-02-27 ENCOUNTER — Ambulatory Visit: Payer: BC Managed Care – PPO

## 2023-03-05 ENCOUNTER — Other Ambulatory Visit: Payer: Self-pay | Admitting: Primary Care

## 2023-03-05 DIAGNOSIS — E1165 Type 2 diabetes mellitus with hyperglycemia: Secondary | ICD-10-CM

## 2023-03-07 ENCOUNTER — Ambulatory Visit (INDEPENDENT_AMBULATORY_CARE_PROVIDER_SITE_OTHER): Payer: BC Managed Care – PPO | Admitting: Podiatry

## 2023-03-07 DIAGNOSIS — Z91199 Patient's noncompliance with other medical treatment and regimen due to unspecified reason: Secondary | ICD-10-CM

## 2023-03-07 NOTE — Progress Notes (Signed)
1. No-show for appointment     

## 2023-03-14 ENCOUNTER — Encounter: Payer: Self-pay | Admitting: Oncology

## 2023-03-22 ENCOUNTER — Inpatient Hospital Stay: Payer: Medicaid Other | Admitting: Primary Care

## 2023-03-23 ENCOUNTER — Encounter: Payer: Self-pay | Admitting: Primary Care

## 2023-03-28 ENCOUNTER — Other Ambulatory Visit (HOSPITAL_COMMUNITY): Payer: Self-pay

## 2023-03-29 ENCOUNTER — Other Ambulatory Visit (HOSPITAL_COMMUNITY): Payer: Self-pay

## 2023-03-31 ENCOUNTER — Other Ambulatory Visit (HOSPITAL_COMMUNITY): Payer: Self-pay

## 2023-03-31 ENCOUNTER — Encounter: Payer: Self-pay | Admitting: Oncology

## 2023-04-04 ENCOUNTER — Ambulatory Visit: Payer: Medicaid Other | Admitting: Primary Care

## 2023-04-10 ENCOUNTER — Telehealth: Payer: Self-pay | Admitting: Primary Care

## 2023-04-10 DIAGNOSIS — E119 Type 2 diabetes mellitus without complications: Secondary | ICD-10-CM

## 2023-04-10 DIAGNOSIS — E1165 Type 2 diabetes mellitus with hyperglycemia: Secondary | ICD-10-CM

## 2023-04-10 DIAGNOSIS — I10 Essential (primary) hypertension: Secondary | ICD-10-CM

## 2023-04-10 MED ORDER — SPIRONOLACTONE 25 MG PO TABS
25.0000 mg | ORAL_TABLET | Freq: Every day | ORAL | 0 refills | Status: DC
Start: 2023-04-10 — End: 2023-07-26

## 2023-04-10 MED ORDER — METFORMIN HCL ER 500 MG PO TB24
ORAL_TABLET | ORAL | 0 refills | Status: DC
Start: 2023-04-10 — End: 2023-08-17

## 2023-04-10 MED ORDER — AMLODIPINE BESYLATE 10 MG PO TABS
10.0000 mg | ORAL_TABLET | Freq: Every day | ORAL | 0 refills | Status: DC
Start: 2023-04-10 — End: 2023-10-26

## 2023-04-10 MED ORDER — GLIPIZIDE ER 10 MG PO TB24
10.0000 mg | ORAL_TABLET | Freq: Every day | ORAL | 0 refills | Status: DC
Start: 2023-04-10 — End: 2023-07-26

## 2023-04-10 NOTE — Addendum Note (Signed)
Addended by: Doreene Nest on: 04/10/2023 08:12 PM   Modules accepted: Orders

## 2023-04-10 NOTE — Telephone Encounter (Signed)
Prescription Request  04/10/2023  LOV: 01/02/2023  What is the name of the medication or equipment? amLODipine (NORVASC) 10 MG tablet, metFORMIN (GLUCOPHAGE-XR) 500 MG 24 hr tablet, glipiZIDE (GLUCOTROL XL) 10 MG 24 hr tablet & spironolactone (ALDACTONE) 25 MG tablet   Have you contacted your pharmacy to request a refill? Yes , pharmacy called for pt   Which pharmacy would you like this sent to?  [facility]) [phone] - [place], [place] - [address] AT [facility] [address] [place] [address] Phone: [phone] Fax: [phone]    Patient notified that their request is being sent to the clinical staff for review and that they should receive a response within 2 business days.   Please advise at [phone]   [facility] in [place] called stating the pt wanted all his prescriptions to go to them now. [facility] requested new prescriptions for those meds. [facility] also asked to change rx from glucose blood (ONETOUCH ULTRA) test strip to Accu-Chek Guide Test Strips? [facility] requested rx sent for test strips as well? Pharmacy stated Medicaid doesn't cover one touch ultra.

## 2023-04-10 NOTE — Telephone Encounter (Signed)
Refills sent to pharmacy. 

## 2023-04-20 ENCOUNTER — Other Ambulatory Visit: Payer: Medicaid Other

## 2023-04-20 ENCOUNTER — Other Ambulatory Visit: Payer: BC Managed Care – PPO

## 2023-04-27 ENCOUNTER — Encounter: Payer: Self-pay | Admitting: Primary Care

## 2023-04-27 ENCOUNTER — Ambulatory Visit: Payer: Medicaid Other | Admitting: Primary Care

## 2023-04-27 VITALS — BP 132/88 | HR 87 | Temp 98.5°F | Ht 74.0 in | Wt 293.0 lb

## 2023-04-27 DIAGNOSIS — Z7984 Long term (current) use of oral hypoglycemic drugs: Secondary | ICD-10-CM

## 2023-04-27 DIAGNOSIS — E1165 Type 2 diabetes mellitus with hyperglycemia: Secondary | ICD-10-CM

## 2023-04-27 DIAGNOSIS — Z7985 Long-term (current) use of injectable non-insulin antidiabetic drugs: Secondary | ICD-10-CM | POA: Diagnosis not present

## 2023-04-27 LAB — POCT GLYCOSYLATED HEMOGLOBIN (HGB A1C): Hemoglobin A1C: 8.6 % — AB (ref 4.0–5.6)

## 2023-04-27 MED ORDER — OZEMPIC (0.25 OR 0.5 MG/DOSE) 2 MG/3ML ~~LOC~~ SOPN: PEN_INJECTOR | SUBCUTANEOUS | 0 refills | Status: DC

## 2023-04-27 NOTE — Patient Instructions (Signed)
Start semaglutide (Ozempic) for diabetes/weight loss. Start by injecting 0.25 mg into the skin once weekly for 4 weeks, then increase to 0.5 mg once weekly thereafter.  Continue metformin and glipizide for diabetes.  Schedule your physical for 3 months.  It was a pleasure to see you today!

## 2023-04-27 NOTE — Assessment & Plan Note (Addendum)
Improved but still above goal with A1c of 8.6 today.  Discussed other options for treatment as he is maxed out on both glipizide and metformin. Recommended GLP-1 agonist given his cravings, obesity, diabetes.  He agrees.  Start semaglutide (Ozempic) for diabetes/weight loss. Start by injecting 0.25 mg into the skin once weekly for 4 weeks, then increase to 0.5 mg once weekly thereafter.  Discussed potential side effects and instructions for administration.  Continue metformin XR 1000 mg twice daily, glipizide XL 10 mg daily.  Follow-up in 3 months.

## 2023-04-27 NOTE — Progress Notes (Signed)
Subjective:    Patient ID: Steven [name], male    DOB: April 04, 1981, 42 y.o.   MRN: [id number]  Diabetes Pertinent negatives for hypoglycemia include no dizziness. Pertinent negatives for diabetes include no chest pain.    Steven [name] is a very pleasant 42 y.o. male with a significant medical history including hypertension, type 2 diabetes, hypothyroidism, asthma, lymphedema, HIV, hyperlipidemia, chronic hypokalemia who presents today for follow-up of diabetes.  Current medications include: Glipizide XL 10 mg daily, metformin XR 1000 mg twice daily.  He is checking his blood glucose 2 times daily and is getting readings of:  AM fasting: 70-100 1 hour after dinner: 150-270  Last A1C: 10.2 in June 2024, 8.6 today Last Eye Exam: Due Last Foot Exam: Up-to-date Pneumonia Vaccination: 2022 Urine Microalbumin: Up-to-date Statin: Rosuvastatin  Dietary changes since last visit: He has not ben treated with steroids for 3 months. He admits to a poor diet with snacking.    Exercise: Walking daily.     Review of Systems  Respiratory:  Negative for shortness of breath.   Cardiovascular:  Negative for chest pain.  Neurological:  Negative for dizziness and numbness.         Past Medical History:  Diagnosis Date   Asthma    Cancer (HCC)    Colitis 05/27/2011   Diabetes mellitus without complication (HCC)    HIV positive (HCC) 03/23/09   Genotype Y181C   HTN (hypertension)    Kaposi's sarcoma    Syphilis 03/23/09-   1:2    Social History   Socioeconomic History   Marital status: Single    Spouse name: Not on file   Number of children: Not on file   Years of education: Not on file   Highest education level: Not on file  Occupational History   Not on file  Tobacco Use   Smoking status: Never   Smokeless tobacco: Never  Vaping Use   Vaping status: Never Used  Substance and Sexual Activity   Alcohol use: Yes    Alcohol/week: 0.0 standard drinks of alcohol     Comment: socially - less now   Drug use: No   Sexual activity: Not Currently    Partners: Male    Birth control/protection: Condom    Comment: pt. declined condoms  Other Topics Concern   Not on file  Social History Narrative   Single.    No children.   Works in Clinical biochemist   Enjoys DJ, traveling.    Social Determinants of Health   Financial Resource Strain: Low Risk  (01/02/2023)   Overall Financial Resource Strain (CARDIA)    Difficulty of Paying Living Expenses: Not very hard  Food Insecurity: Not on file  Transportation Needs: No Transportation Needs (01/02/2023)   PRAPARE - Administrator, Civil Service (Medical): No    Lack of Transportation (Non-Medical): No  Physical Activity: Insufficiently Active (01/02/2023)   Exercise Vital Sign    Days of Exercise per Week: 2 days    Minutes of Exercise per Session: 20 min  Stress: Patient Declined (01/02/2023)   [employer] of Occupational Health - Occupational Stress Questionnaire    Feeling of Stress : Patient declined  Social Connections: Unknown (01/02/2023)   Social Connection and Isolation Panel [NHANES]    Frequency of Communication with Friends and Family: Not on file    Frequency of Social Gatherings with Friends and Family: Not on file    Attends Religious Services: Not on file  Active Member of Clubs or Organizations: No    Attends Banker Meetings: Not on file    Marital Status: Not on file  Intimate Partner Violence: Unknown (10/15/2021)   [facility]   HITS    Physically Hurt: Not on file    Insult or Talk Down To: Not on file    Threaten Physical Harm: Not on file    Scream or Curse: Not on file    Past Surgical History:  Procedure Laterality Date   BRAIN SURGERY     IR GENERIC HISTORICAL  02/12/2016   IR REMOVAL TUN ACCESS W/ PORT W/O FL MOD SED 02/12/2016 Richarda Overlie, MD [facility]-INTER[facility] RAD    Family History  Problem Relation Age of Onset    Pancreatic cancer Mother    Cancer Mother    Diabetes Paternal Grandmother    Thyroid disease Paternal Grandmother     Allergies  Allergen Reactions   Lisinopril Swelling    Swelling of lower lip while on lisinopril   Penicillins Hives and Swelling    REACTION: rash and swelling   Levaquin [Levofloxacin In D5w] Rash   Tessalon [Benzonatate] Rash   Aspirin Nausea Only    Current Outpatient Medications on File Prior to Visit  Medication Sig Dispense Refill   abacavir-dolutegravir-lamiVUDine (TRIUMEQ) 600-50-300 MG tablet Take 1 tablet by mouth daily. 90 tablet 3   albuterol (VENTOLIN HFA) 108 (90 Base) MCG/ACT inhaler Inhale 2 puffs every 4-6 hours as as needed for cough, wheeze, tightness in chest, or shortness of breath 18 g 1   amLODipine (NORVASC) 10 MG tablet Take 1 tablet (10 mg total) by mouth daily. for blood pressure. 90 tablet 0   Blood Glucose Monitoring Suppl (ONETOUCH VERIO) w/Device KIT USE AS DIRECTED TO TEST UP TO FOUR TIMES DAILY     ciclopirox (PENLAC) 8 % solution APPLY 1 COAT TO TOENAIL EVERY DAY FOR 48 WEEKS. REMOVE WEEKLY WITH POLISH REMOVER 6.6 mL 11   EPINEPHrine 0.3 mg/0.3 mL IJ SOAJ injection INJECT 0.3 MG IN THE MUSCLE AS NEEDED FOR ANAPHYLAXIS 2 each 1   esomeprazole (NEXIUM) 40 MG capsule TAKE ONE CAPSULE BY MOUTH ONCE DAILY AS DIRECTED (Patient taking differently: Take 40 mg by mouth daily.) 30 capsule 0   fexofenadine (ALLEGRA) 180 MG tablet Take 1 tablet (180 mg total) by mouth daily. 15 tablet 0   fluticasone (FLONASE) 50 MCG/ACT nasal spray SHAKE WELL USE 1 OR 2 SPRAYS IN EACH NOSTRIL EVERYDAY FOR NASAL CONGESTION 16 mL 5   fluticasone-salmeterol (ADVAIR HFA) 230-21 MCG/ACT inhaler INHALE 2 PUFFS BY MOUTH TWICE DAILY 12 g 5   glipiZIDE (GLUCOTROL XL) 10 MG 24 hr tablet Take 1 tablet (10 mg total) by mouth daily with breakfast. for diabetes. 90 tablet 0   glucose blood (ONETOUCH ULTRA) test strip USE UP TO 4 TIMES A DAY AS DIRECTED 300 strip 3   ibuprofen  (ADVIL) 800 MG tablet Take 800 mg by mouth 3 (three) times daily as needed.     LANCETS MICRO THIN 33G MISC USE TO TEST BLOOD SUGAR ONCE A DAY 100 each 2   levothyroxine (SYNTHROID) 150 MCG tablet Take 1 tablet (150 mcg total) by mouth daily before breakfast. And additional 1/2 tab once a week. 92 tablet 4   metFORMIN (GLUCOPHAGE-XR) 500 MG 24 hr tablet TAKE 2 TABLETS BY MOUTH IN THE MORNING AND IN THE EVENING FOR DIABETES 360 tablet 0   montelukast (SINGULAIR) 10 MG tablet TAKE  1 TABLET(10 MG) BY MOUTH DAILY 90 tablet 1   Multiple Vitamin (MULTIVITAMIN) tablet Take 1 tablet by mouth daily.     ONE TOUCH ULTRA TEST test strip TEST BLOOD GLUCOSE THREE TIMES DAILY AS DIRECTED 100 each 2   rosuvastatin (CRESTOR) 5 MG tablet TAKE 1 TAB EVERY DAY BY MOUTH FOR CHOLESTEROL 90 tablet 0   spironolactone (ALDACTONE) 25 MG tablet Take 1 tablet (25 mg total) by mouth daily. for blood pressure. 90 tablet 0   TRELEGY ELLIPTA 200-62.5-25 MCG/ACT AEPB Inhale 1 puff into the lungs daily.     Current Facility-Administered Medications on File Prior to Visit  Medication Dose Route Frequency Provider Last Rate Last Admin   omalizumab Geoffry Paradise) injection 300 mg  300 mg Subcutaneous Q14 Days Wyline Mood M, DO   300 mg at 10/12/22 1236    BP 132/88 (BP Location: Left Arm, Patient Position: Sitting, Cuff Size: Large)   Pulse 87   Temp 98.5 F (36.9 C)   Ht 6\' 2"  (1.88 m)   Wt 293 lb (132.9 kg)   SpO2 97%   BMI 37.62 kg/m  Objective:   Physical Exam Cardiovascular:     Rate and Rhythm: Normal rate and regular rhythm.  Pulmonary:     Effort: Pulmonary effort is normal.     Breath sounds: Normal breath sounds.  Musculoskeletal:     Cervical back: Neck supple.  Skin:    General: Skin is warm and dry.  Neurological:     Mental Status: He is alert and oriented to person, place, and time.  Psychiatric:        Mood and Affect: Mood normal.           Assessment & Plan:  Type 2 diabetes mellitus with  hyperglycemia, without long-term current use of insulin (HCC) Assessment & Plan: Improved but still above goal with A1c of 8.6 today.  Discussed other options for treatment as he is maxed out on both glipizide and metformin. Recommended GLP-1 agonist given his cravings, obesity, diabetes.  He agrees.  Start semaglutide (Ozempic) for diabetes/weight loss. Start by injecting 0.25 mg into the skin once weekly for 4 weeks, then increase to 0.5 mg once weekly thereafter.  Discussed potential side effects and instructions for administration.  Continue metformin XR 1000 mg twice daily, glipizide XL 10 mg daily.  Follow-up in 3 months.  Orders: -     POCT glycosylated hemoglobin (Hb A1C) -     Ozempic (0.25 or 0.5 MG/DOSE); Inject 0.25 mg into the skin once weekly for 4 weeks, then increase to 0.5 mg once weekly thereafter for diabetes.  Dispense: 9 mL; Refill: 0        Doreene Nest, NP

## 2023-04-29 ENCOUNTER — Other Ambulatory Visit: Payer: Self-pay | Admitting: Primary Care

## 2023-04-29 DIAGNOSIS — I10 Essential (primary) hypertension: Secondary | ICD-10-CM

## 2023-05-02 ENCOUNTER — Ambulatory Visit: Payer: Medicaid Other | Admitting: Primary Care

## 2023-05-04 ENCOUNTER — Ambulatory Visit: Payer: Medicaid Other | Admitting: Internal Medicine

## 2023-05-11 ENCOUNTER — Other Ambulatory Visit: Payer: Self-pay | Admitting: Primary Care

## 2023-05-11 DIAGNOSIS — E1165 Type 2 diabetes mellitus with hyperglycemia: Secondary | ICD-10-CM

## 2023-05-16 ENCOUNTER — Encounter: Payer: Self-pay | Admitting: Oncology

## 2023-05-22 ENCOUNTER — Other Ambulatory Visit: Payer: Self-pay | Admitting: Infectious Diseases

## 2023-05-22 DIAGNOSIS — Z79899 Other long term (current) drug therapy: Secondary | ICD-10-CM

## 2023-05-22 DIAGNOSIS — B20 Human immunodeficiency virus [HIV] disease: Secondary | ICD-10-CM

## 2023-05-22 DIAGNOSIS — Z113 Encounter for screening for infections with a predominantly sexual mode of transmission: Secondary | ICD-10-CM

## 2023-05-23 ENCOUNTER — Other Ambulatory Visit: Payer: Medicaid Other

## 2023-05-29 DIAGNOSIS — E1165 Type 2 diabetes mellitus with hyperglycemia: Secondary | ICD-10-CM

## 2023-05-29 MED ORDER — LANCET DEVICE MISC
1.0000 | Freq: Three times a day (TID) | 0 refills | Status: AC
Start: 2023-05-29 — End: 2023-06-28

## 2023-05-29 MED ORDER — BLOOD GLUCOSE MONITORING SUPPL DEVI
1.0000 | Freq: Three times a day (TID) | 0 refills | Status: AC
Start: 2023-05-29 — End: ?

## 2023-05-29 MED ORDER — BLOOD GLUCOSE TEST VI STRP: 1.0000 | ORAL_STRIP | Freq: Three times a day (TID) | 0 refills | Status: DC

## 2023-05-29 MED ORDER — LANCETS MISC. MISC
1.0000 | Freq: Three times a day (TID) | 0 refills | Status: AC
Start: 2023-05-29 — End: ?

## 2023-06-05 ENCOUNTER — Other Ambulatory Visit: Payer: Self-pay | Admitting: Primary Care

## 2023-06-05 DIAGNOSIS — E119 Type 2 diabetes mellitus without complications: Secondary | ICD-10-CM

## 2023-06-06 ENCOUNTER — Encounter: Payer: Self-pay | Admitting: Oncology

## 2023-06-06 ENCOUNTER — Ambulatory Visit: Payer: Medicaid Other | Admitting: Infectious Diseases

## 2023-06-06 ENCOUNTER — Other Ambulatory Visit (HOSPITAL_COMMUNITY)
Admission: RE | Admit: 2023-06-06 | Discharge: 2023-06-06 | Disposition: A | Discharge status: Home / Self Care | Payer: Medicaid Other | Source: Ambulatory Visit | Attending: Infectious Diseases | Admitting: Infectious Diseases

## 2023-06-06 VITALS — BP 157/95 | HR 97 | Temp 98.8°F | Ht 74.0 in | Wt 297.1 lb

## 2023-06-06 DIAGNOSIS — B20 Human immunodeficiency virus [HIV] disease: Secondary | ICD-10-CM | POA: Diagnosis not present

## 2023-06-06 DIAGNOSIS — N181 Chronic kidney disease, stage 1: Secondary | ICD-10-CM

## 2023-06-06 DIAGNOSIS — I129 Hypertensive chronic kidney disease with stage 1 through stage 4 chronic kidney disease, or unspecified chronic kidney disease: Secondary | ICD-10-CM | POA: Diagnosis not present

## 2023-06-06 DIAGNOSIS — R609 Edema, unspecified: Secondary | ICD-10-CM

## 2023-06-06 DIAGNOSIS — Z113 Encounter for screening for infections with a predominantly sexual mode of transmission: Secondary | ICD-10-CM

## 2023-06-06 DIAGNOSIS — E1165 Type 2 diabetes mellitus with hyperglycemia: Secondary | ICD-10-CM | POA: Diagnosis not present

## 2023-06-06 DIAGNOSIS — E890 Postprocedural hypothyroidism: Secondary | ICD-10-CM

## 2023-06-06 DIAGNOSIS — E039 Hypothyroidism, unspecified: Secondary | ICD-10-CM

## 2023-06-06 DIAGNOSIS — T7840XD Allergy, unspecified, subsequent encounter: Secondary | ICD-10-CM

## 2023-06-06 DIAGNOSIS — E1122 Type 2 diabetes mellitus with diabetic chronic kidney disease: Secondary | ICD-10-CM | POA: Diagnosis not present

## 2023-06-06 DIAGNOSIS — Z7984 Long term (current) use of oral hypoglycemic drugs: Secondary | ICD-10-CM

## 2023-06-06 DIAGNOSIS — I10 Essential (primary) hypertension: Secondary | ICD-10-CM

## 2023-06-06 DIAGNOSIS — C469 Kaposi's sarcoma, unspecified: Secondary | ICD-10-CM

## 2023-06-06 DIAGNOSIS — T7840XS Allergy, unspecified, sequela: Secondary | ICD-10-CM

## 2023-06-06 NOTE — Assessment & Plan Note (Signed)
He is doing very well Questions about changing his meds- we discussed, will investigate cabaneuva when his insurance changed He has gotten his fall vax.  Will check his labs Offered/refused condoms.

## 2023-06-06 NOTE — Assessment & Plan Note (Signed)
His last two Cr have been normal.

## 2023-06-06 NOTE — Assessment & Plan Note (Signed)
Apprecaite allergy center f/u Hope to avoid steroids

## 2023-06-06 NOTE — Assessment & Plan Note (Signed)
Appreciate Dr Kalman Drape f/u Pt will call for appt

## 2023-06-06 NOTE — Assessment & Plan Note (Signed)
Last A1C 8.6 Improving.  Appreacite PCP f/u Watch wt with ozempeic.

## 2023-06-06 NOTE — Assessment & Plan Note (Signed)
Appreciate his PCP f/u He is asx.

## 2023-06-06 NOTE — Assessment & Plan Note (Signed)
Appreciate endo f/u.

## 2023-06-06 NOTE — Progress Notes (Signed)
Subjective:    Patient ID: Steven [name], male  DOB: [birth year], 42 y.o.        MRN: [id number]   HPI 42 yo M with hx of AIDS, KS (03-2009). He is being monitored for his KS by Onc.   He was previously on CTX however in 2011 he had an intracranial bleed.    He was changed from TRV/ISN in 2014 to triumeq. He had f/u with Onc on (last visit > 1 year), was felt to be doing well.  He also has been seen by FP and endo for hypothyroidism and DM2.  CKD 1 A1C 5.5% (07-2021). Had R knee arthroscopy 11-19-20.  Has occas arthritis.  Has had several ED visits this year for allergic reactions, including angioedema.  Gets xolair shots as well.  Has changed jobs- less stress, feels better.  Has been ozempiec for last 4-5 weeks, Glc had gone u pdue to his multiple courses of steroids. Glc have been improving. 140-200 (depending at meal).  Has gotten flu and covid vax.   HIV 1 RNA Quant (Copies/mL)  Date Value  05/25/2022 Not Detected  08/19/2021 <20 (H)  09/14/2020 <20 (H)   CD4 T Cell Abs (/uL)  Date Value  05/25/2022 266 (L)  09/14/2020 283 (L)  12/13/2019 349 (L)     Health Maintenance  Topic Date Due  . OPHTHALMOLOGY EXAM  03/22/2023  . COVID-19 Vaccine (8 - 2023-24 season) 07/15/2023  . Diabetic kidney evaluation - Urine ACR  08/24/2023  . FOOT EXAM  08/24/2023  . HEMOGLOBIN A1C  10/26/2023  . DTaP/Tdap/Td (2 - Td or Tdap) 12/27/2023  . Diabetic kidney evaluation - eGFR measurement  02/09/2024  . INFLUENZA VACCINE  Completed  . Hepatitis C Screening  Completed  . HIV Screening  Completed  . HPV VACCINES  Aged Out   No Fhx of colon cancer.    Review of Systems  Constitutional:  Negative for chills, fever and weight loss.  Respiratory:  Negative for cough and shortness of breath.   Cardiovascular:  Negative for chest pain.  Gastrointestinal:  Negative for constipation and diarrhea.  Genitourinary:  Negative for dysuria.  Neurological:  Negative for headaches.   Please  see HPI. All other systems reviewed and negative.     Objective:  Physical Exam Constitutional:      General: He is not in acute distress.    Appearance: He is not toxic-appearing.  HENT:     Mouth/Throat:     Mouth: Mucous membranes are moist.     Pharynx: No oropharyngeal exudate.  Eyes:     Extraocular Movements: Extraocular movements intact.     Pupils: Pupils are equal, round, and reactive to light.  Cardiovascular:     Rate and Rhythm: Normal rate and regular rhythm.  Pulmonary:     Effort: Pulmonary effort is normal.     Breath sounds: Normal breath sounds.  Abdominal:     General: Bowel sounds are normal. There is no distension.     Palpations: Abdomen is soft.     Tenderness: There is no abdominal tenderness.  Musculoskeletal:        General: Normal range of motion.     Cervical back: Normal range of motion and neck supple.     Right lower leg: Edema present.     Left lower leg: Edema present.  Neurological:     General: No focal deficit present.     Mental Status: He is alert.  Psychiatric:  Mood and Affect: Mood normal.          Assessment & Plan:

## 2023-06-06 NOTE — Assessment & Plan Note (Signed)
Will have him seen by [facility].

## 2023-06-06 NOTE — Addendum Note (Signed)
Addended by: Jalee Saine C on: 06/06/2023 03:31 PM   Modules accepted: Orders

## 2023-06-07 ENCOUNTER — Telehealth: Payer: Self-pay | Admitting: *Deleted

## 2023-06-07 LAB — URINE CYTOLOGY ANCILLARY ONLY
Chlamydia: NEGATIVE
Comment: NEGATIVE
Comment: NORMAL
Neisseria Gonorrhea: NEGATIVE

## 2023-06-07 LAB — T-HELPER CELL (CD4) - (RCID CLINIC ONLY)
CD4 % Helper T Cell: 20 % — ABNORMAL LOW (ref 33–65)
CD4 T Cell Abs: 353 /uL — ABNORMAL LOW (ref 400–1790)

## 2023-06-07 NOTE — Telephone Encounter (Signed)
Reviewed [facility] note, RPR stable at 1:1, no increase in titer, no action needed.

## 2023-06-07 NOTE — Telephone Encounter (Signed)
Abnormal lab results placed on desk for Dr. Mikey Bussing to review.Marland Kitchen

## 2023-06-08 LAB — COMPREHENSIVE METABOLIC PANEL
ALT: 28 [IU]/L (ref 0–44)
AST: 18 [IU]/L (ref 0–40)
Albumin: 4.8 g/dL (ref 4.1–5.1)
Alkaline Phosphatase: 65 [IU]/L (ref 44–121)
BUN/Creatinine Ratio: 10 (ref 9–20)
BUN: 11 mg/dL (ref 6–24)
Bilirubin Total: 0.7 mg/dL (ref 0.0–1.2)
CO2: 24 mmol/L (ref 20–29)
Calcium: 9.8 mg/dL (ref 8.7–10.2)
Chloride: 101 mmol/L (ref 96–106)
Creatinine, Ser: 1.07 mg/dL (ref 0.76–1.27)
Globulin, Total: 2.7 g/dL (ref 1.5–4.5)
Glucose: 130 mg/dL — ABNORMAL HIGH (ref 70–99)
Potassium: 3.7 mmol/L (ref 3.5–5.2)
Sodium: 144 mmol/L (ref 134–144)
Total Protein: 7.5 g/dL (ref 6.0–8.5)
eGFR: 88 mL/min/{1.73_m2} (ref >59)

## 2023-06-08 LAB — CBC
Hematocrit: 49.3 % (ref 37.5–51.0)
Hemoglobin: 15.9 g/dL (ref 13.0–17.7)
MCH: 25.9 pg — ABNORMAL LOW (ref 26.6–33.0)
MCHC: 32.3 g/dL (ref 31.5–35.7)
MCV: 80 fL (ref 79–97)
Platelets: 253 10*3/uL (ref 150–450)
RBC: 6.14 x10E6/uL — ABNORMAL HIGH (ref 4.14–5.80)
RDW: 13.1 % (ref 11.6–15.4)
WBC: 5.6 10*3/uL (ref 3.4–10.8)

## 2023-06-08 LAB — HIV-1 RNA QUANT-NO REFLEX-BLD

## 2023-06-08 LAB — RPR, QUANT+TP ABS (REFLEX): Rapid Plasma Reagin, Quant: 1:1 {titer} — ABNORMAL HIGH

## 2023-06-08 LAB — LIPID PANEL
Chol/HDL Ratio: 2.8 {ratio} (ref 0.0–5.0)
Cholesterol, Total: 117 mg/dL (ref 100–199)
HDL: 42 mg/dL (ref >39)
LDL Chol Calc (NIH): 52 mg/dL (ref 0–99)
Triglycerides: 132 mg/dL (ref 0–149)
VLDL Cholesterol Cal: 23 mg/dL (ref 5–40)

## 2023-06-08 LAB — RPR: RPR Ser Ql: REACTIVE — AB

## 2023-06-13 ENCOUNTER — Telehealth: Payer: Self-pay

## 2023-06-13 ENCOUNTER — Encounter: Payer: Self-pay | Admitting: Oncology

## 2023-06-13 ENCOUNTER — Other Ambulatory Visit (HOSPITAL_COMMUNITY): Payer: Self-pay

## 2023-06-13 NOTE — Telephone Encounter (Signed)
Pharmacy Patient Advocate Encounter   Received notification from Physician's Office that prior authorization for CABENUVA is required/requested.   Insurance verification completed.   The patient is insured through [facility] .   Per test claim: PA required; PA submitted to above mentioned insurance via CoverMyMeds Key/confirmation #/EOC [facility]. Status is pending

## 2023-06-13 NOTE — Telephone Encounter (Signed)
-----   Message from Johny Sax sent at 06/06/2023  3:11 PM EST ----- Can Mr Broome get cabaneuva? His insurance will change on Dec 1 Thasnk jeff

## 2023-06-14 NOTE — Telephone Encounter (Signed)
I will submit auth and f/u once I have response from the insurance. Selena Batten

## 2023-06-14 NOTE — Telephone Encounter (Signed)
Pharmacy Patient Advocate Encounter  Received notification from [facility] that Prior Authorization for CABENUVA has been DENIED.  Full denial letter will be uploaded to the media tab. See denial reason below.  The requested medication and/or diagnosis are not a covered benefit and excluded from coverage in accordance with the terms and conditions of your plan benefit.   Medication denied under pharmacy benefit. Could you all look into medical benefit coverage?

## 2023-06-21 ENCOUNTER — Telehealth: Payer: Self-pay | Admitting: Pharmacy Technician

## 2023-06-21 NOTE — Telephone Encounter (Signed)
Auth Submission: NO AUTH NEEDED Site of care: [facility] Payer: [place] MEDICAID Medication & CPT/J Code(s) submitted: ZOXWRUEA V4098  Route of submission (phone, fax, portal): [place] TRACKS PORTAL and Phone Phone # [phone] Fax # Auth type: Buy/Bill PB Units/visits requested: MD. Dr. Ninetta Lights site [facility] DX Code B20, [place] A2130 = 300units q2 months  Reference number: [id number] Rep: Colin Mulders Approval from: 06/21/23 to 07/11/23

## 2023-06-28 ENCOUNTER — Telehealth: Payer: Self-pay | Admitting: Family Medicine

## 2023-06-28 NOTE — Telephone Encounter (Signed)
Left voicemail to give the office a call back to schedule Xolair reapproval appointment.

## 2023-06-30 ENCOUNTER — Encounter: Payer: Self-pay | Admitting: Oncology

## 2023-07-03 ENCOUNTER — Encounter: Payer: Self-pay | Admitting: Oncology

## 2023-07-14 ENCOUNTER — Other Ambulatory Visit: Payer: Self-pay | Admitting: Primary Care

## 2023-07-14 DIAGNOSIS — E1165 Type 2 diabetes mellitus with hyperglycemia: Secondary | ICD-10-CM

## 2023-07-17 ENCOUNTER — Encounter: Payer: Self-pay | Admitting: Family Medicine

## 2023-07-17 ENCOUNTER — Encounter: Payer: Self-pay | Admitting: Oncology

## 2023-07-17 ENCOUNTER — Ambulatory Visit: Payer: 59 | Admitting: Family Medicine

## 2023-07-17 VITALS — BP 140/80 | HR 99 | Temp 98.3°F | Resp 16 | Ht 72.0 in | Wt 293.8 lb

## 2023-07-17 DIAGNOSIS — J302 Other seasonal allergic rhinitis: Secondary | ICD-10-CM | POA: Diagnosis not present

## 2023-07-17 DIAGNOSIS — J3089 Other allergic rhinitis: Secondary | ICD-10-CM

## 2023-07-17 DIAGNOSIS — Z21 Asymptomatic human immunodeficiency virus [HIV] infection status: Secondary | ICD-10-CM | POA: Diagnosis not present

## 2023-07-17 DIAGNOSIS — K219 Gastro-esophageal reflux disease without esophagitis: Secondary | ICD-10-CM | POA: Diagnosis not present

## 2023-07-17 DIAGNOSIS — H1013 Acute atopic conjunctivitis, bilateral: Secondary | ICD-10-CM

## 2023-07-17 DIAGNOSIS — Z91018 Allergy to other foods: Secondary | ICD-10-CM | POA: Diagnosis not present

## 2023-07-17 DIAGNOSIS — J4550 Severe persistent asthma, uncomplicated: Secondary | ICD-10-CM | POA: Diagnosis not present

## 2023-07-17 MED ORDER — ALBUTEROL SULFATE HFA 108 (90 BASE) MCG/ACT IN AERS: INHALATION_SPRAY | RESPIRATORY_TRACT | 1 refills | Status: DC

## 2023-07-17 MED ORDER — FLUTICASONE-SALMETEROL 230-21 MCG/ACT IN AERO: INHALATION_SPRAY | RESPIRATORY_TRACT | 5 refills | Status: DC

## 2023-07-17 MED ORDER — ESOMEPRAZOLE MAGNESIUM 40 MG PO CPDR: 40.0000 mg | DELAYED_RELEASE_CAPSULE | Freq: Every day | ORAL | 5 refills | Status: AC

## 2023-07-17 NOTE — Progress Notes (Signed)
 [address] [place] [address] Dept: [phone]  FOLLOW UP NOTE  Patient ID: Steven [name], male    DOB: 1980/10/22  Age: 43 y.o. MRN: [id number] Date of Office Visit: 07/17/2023  Assessment  Chief Complaint: Follow-up  HPI Steven [name] is a 43 year old male who presents to the clinic for a follow-up visit.  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  At today's visit, he reports his asthma has been poorly controlled with symptoms including occasional shortness of breath, wheeze occurring mostly in the daytime, and cough producing clear mucus.  He continues montelukast  10 mg once a day and uses albuterol  about 2 days a week with moderate relief of symptoms.  He has previously used Advair  230, however, he has been out of this medication for about 1 month.  He has recently changed jobs and subsequently changed insurance polieies.  He is interested in restarting Xolair  injections for asthma control.  He denies experiencing asthma flare from odors over the last several months since changing to a new job.  Allergic rhinitis is reported as moderately well-controlled with symptoms including occasional clear rhinorrhea and nasal congestion.  He continues cetirizine 10 mg once a day and uses Flonase  as needed.  He is not currently using a nasal saline rinse.    His last environmental allergy testing was greater than 5 years ago and was positive to grass pollen, weed pollen, mold, dust mite, cat, and dog.   Allergic conjunctivitis is reported as moderately well-controlled with symptoms of frequent red eyes which are occasionally itchy.  He continues to olopatadine  with moderate relief of symptoms  Reflux is reported as well-controlled with no symptoms including heartburn or vomiting.  He continues esomeprazole  daily with  relief of symptoms.  He continues to avoid shellfish and mushrooms with no accidental ingestion or EpiPen  use since his last visit to this clinic.  He reports that he has not avoiding any foods other than shellfish and mushroom at this time.  Epinephrine  autoinjector set is up-to-date.  HIV is reported as well controlled and he continues to follow up with Dr. Eben as recommended.  His current medications are listed in the chart.  Drug Allergies:  Allergies  Allergen Reactions   Lisinopril Swelling    Swelling of lower lip while on lisinopril   Penicillins Hives and Swelling    REACTION: rash and swelling   Levaquin [Levofloxacin In D5w] Rash   Tessalon [Benzonatate] Rash   Aspirin Nausea Only    Physical Exam: BP (!) 140/80   Pulse 99   Temp 98.3 F (36.8 C) (Temporal)   Resp 16   Ht 6' (1.829 m)   Wt 293 lb 12.8 oz (133.3 kg)   SpO2 96%   BMI 39.85 kg/m    Physical Exam Vitals reviewed.  Constitutional:      Appearance: Normal appearance.  HENT:     Head: Normocephalic and atraumatic.     Right Ear: Tympanic membrane normal.     Left Ear: Tympanic membrane normal.     Nose:     Comments: Bilateral nares normal.  Pharynx normal.  Ears normal.  Eyes normal.    Mouth/Throat:     Pharynx: Oropharynx is clear.  Eyes:     Conjunctiva/sclera: Conjunctivae normal.  Cardiovascular:     Rate and Rhythm: Normal rate and regular rhythm.  Heart sounds: Normal heart sounds. No murmur heard. Pulmonary:     Effort: Pulmonary effort is normal.     Breath sounds: Normal breath sounds.     Comments: Lungs clear to auscultation Musculoskeletal:        General: Normal range of motion.     Cervical back: Normal range of motion and neck supple.  Skin:    General: Skin is warm and dry.  Neurological:     Mental Status: He is alert and oriented to person, place, and time.  Psychiatric:        Mood and Affect: Mood normal.        Behavior: Behavior normal.        Thought  Content: Thought content normal.        Judgment: Judgment normal.     Diagnostics: FVC 2.85 which is 57% predicted value, FEV1 1.93 which is 48% of predicted value.  Spirometry indicates possible airway obstruction.  Assessment and Plan: 1. Not well controlled severe persistent asthma   2. Seasonal and perennial allergic rhinoconjunctivitis   3. Gastroesophageal reflux disease, unspecified whether esophagitis present   4. Food allergy   5. HIV infection, unspecified symptom status (HCC)     Meds ordered this encounter  Medications   albuterol  (VENTOLIN  HFA) 108 (90 Base) MCG/ACT inhaler    Sig: Inhale 2 puffs every 4-6 hours as as needed for cough, wheeze, tightness in chest, or shortness of breath    Dispense:  18 g    Refill:  1   esomeprazole  (NEXIUM ) 40 MG capsule    Sig: Take 1 capsule (40 mg total) by mouth daily.    Dispense:  30 capsule    Refill:  5    Last fill needs appt   fluticasone -salmeterol (ADVAIR  HFA) 230-21 MCG/ACT inhaler    Sig: INHALE 2 PUFFS BY MOUTH TWICE DAILY    Dispense:  12 g    Refill:  5    Patient Instructions  Asthma Continue montelukast  10 mg once a day to prevent cough or wheeze Continue Advair  230-2 puffs twice a day with a spacer to prevent cough or wheeze Continue albuterol  2 puffs once every 4 hours as needed for cough or wheeze You may use albuterol  2 puffs 5 to 15 minutes before activity to decrease cough or wheeze Will resubmit for Xolair  injections 300 mg once every 2 weeks to help control your asthma   Allergic rhinitis Continue allergen avoidance measures directed toward grass pollen, weed pollen, mold, dust mite, cat, and dog as listed below Continue an antihistamine once a day as needed for runny nose or itch. Remember to rotate to a different antihistamine about every 3 months. Some examples of over the counter antihistamines include Zyrtec (cetirizine), Xyzal (levocetirizine), Allegra  (fexofenadine ), and Claritin  (loratidine).  Continue Flonase  2 sprays in each nostril once a day as needed for stuffy nose.  In the right nostril, point the applicator out toward the right ear. In the left nostril, point the applicator out toward the left ear Consider saline nasal rinses as needed for nasal symptoms. Use this before any medicated nasal sprays for best result   Allergic conjunctivitis Begin cromolyn  eye drops two drops in each eye up to 4 times a day as needed for red or itchy eyes   Reflux Continue dietary and lifestyle modifications as listed below Continue Nexium  40 mg once a day as needed for reflux   Food allergy Continue to avoid shellfish and mushroom. In  case of an allergic reaction, take Benadryl  50 mg every 4 hours, and if life-threatening symptoms occur, inject with EpiPen  0.3 mg.  Idiopathic allergic reaction If your symptoms re-occur, begin a journal of events that occurred for up to 6 hours before your symptoms began including foods and beverages consumed, soaps or perfumes you had contact with, and medications.   Your blood pressure was elevated while in the office today.  Please make an appointment with your primary care physician to discuss  Call the clinic if this treatment plan is not working well for you.   Follow up in 6 months or sooner if needed.   Return in about 2 months (around 09/14/2023), or if symptoms worsen or fail to improve.    Thank you for the opportunity to care for this patient.  Please do not hesitate to contact me with questions.  Arlean Mutter, FNP [facility] 

## 2023-07-17 NOTE — Patient Instructions (Addendum)
 Asthma Continue montelukast  10 mg once a day to prevent cough or wheeze Continue Advair  230-2 puffs twice a day with a spacer to prevent cough or wheeze Continue albuterol  2 puffs once every 4 hours as needed for cough or wheeze You may use albuterol  2 puffs 5 to 15 minutes before activity to decrease cough or wheeze Will resubmit for Xolair  injections 300 mg once every 2 weeks to help control your asthma   Allergic rhinitis Continue allergen avoidance measures directed toward grass pollen, weed pollen, mold, dust mite, cat, and dog as listed below Continue an antihistamine once a day as needed for runny nose or itch. Remember to rotate to a different antihistamine about every 3 months. Some examples of over the counter antihistamines include Zyrtec (cetirizine), Xyzal (levocetirizine), Allegra  (fexofenadine ), and Claritin (loratidine).  Continue Flonase  2 sprays in each nostril once a day as needed for stuffy nose.  In the right nostril, point the applicator out toward the right ear. In the left nostril, point the applicator out toward the left ear Consider saline nasal rinses as needed for nasal symptoms. Use this before any medicated nasal sprays for best result   Allergic conjunctivitis Begin cromolyn  eye drops two drops in each eye up to 4 times a day as needed for red or itchy eyes   Reflux Continue dietary and lifestyle modifications as listed below Continue Nexium  40 mg once a day as needed for reflux   Food allergy Continue to avoid shellfish and mushroom. In case of an allergic reaction, take Benadryl  50 mg every 4 hours, and if life-threatening symptoms occur, inject with EpiPen  0.3 mg.  Idiopathic allergic reaction If your symptoms re-occur, begin a journal of events that occurred for up to 6 hours before your symptoms began including foods and beverages consumed, soaps or perfumes you had contact with, and medications.   Your blood pressure was elevated while in the office  today.  Please make an appointment with your primary care physician to discuss  Call the clinic if this treatment plan is not working well for you.   Follow up in 2 months or sooner if needed.   Reducing Pollen Exposure The American Academy of Allergy, Asthma and Immunology suggests the following steps to reduce your exposure to pollen during allergy seasons. Do not hang sheets or clothing out to dry; pollen may collect on these items. Do not mow lawns or spend time around freshly cut grass; mowing stirs up pollen. Keep windows closed at night.  Keep car windows closed while driving. Minimize morning activities outdoors, a time when pollen counts are usually at their highest. Stay indoors as much as possible when pollen counts or humidity is high and on windy days when pollen tends to remain in the air longer. Use air conditioning when possible.  Many air conditioners have filters that trap the pollen spores. Use a HEPA room air filter to remove pollen form the indoor air you breathe.   Control of Dust Mite Allergen Dust mites play a major role in allergic asthma and rhinitis. They occur in environments with high humidity wherever human skin is found. Dust mites absorb humidity from the atmosphere (ie, they do not drink) and feed on organic matter (including shed human and animal skin). Dust mites are a microscopic type of insect that you cannot see with the naked eye. High levels of dust mites have been detected from mattresses, pillows, carpets, upholstered furniture, bed covers, clothes, soft toys and any woven material. The  principal allergen of the dust mite is found in its feces. A gram of dust may contain 1,000 mites and 250,000 fecal particles. Mite antigen is easily measured in the air during house cleaning activities. Dust mites do not bite and do not cause harm to humans, other than by triggering allergies/asthma.  Ways to decrease your exposure to dust mites in your home:  1. Encase  mattresses, box springs and pillows with a mite-impermeable barrier or cover  2. Wash sheets, blankets and drapes weekly in hot water (130 F) with detergent and dry them in a dryer on the hot setting.  3. Have the room cleaned frequently with a vacuum cleaner and a damp dust-mop. For carpeting or rugs, vacuuming with a vacuum cleaner equipped with a high-efficiency particulate air (HEPA) filter. The dust mite allergic individual should not be in a room which is being cleaned and should wait 1 hour after cleaning before going into the room.  4. Do not sleep on upholstered furniture (eg, couches).  5. If possible removing carpeting, upholstered furniture and drapery from the home is ideal. Horizontal blinds should be eliminated in the rooms where the person spends the most time (bedroom, study, television room). Washable vinyl, roller-type shades are optimal.  6. Remove all non-washable stuffed toys from the bedroom. Wash stuffed toys weekly like sheets and blankets above.  7. Reduce indoor humidity to less than 50%. Inexpensive humidity monitors can be purchased at most hardware stores. Do not use a humidifier as can make the problem worse and are not recommended.  Control of Dog or Cat Allergen Avoidance is the best way to manage a dog or cat allergy. If you have a dog or cat and are allergic to dog or cats, consider removing the dog or cat from the home. If you have a dog or cat but don't want to find it a new home, or if your family wants a pet even though someone in the household is allergic, here are some strategies that may help keep symptoms at bay:  Keep the pet out of your bedroom and restrict it to only a few rooms. Be advised that keeping the dog or cat in only one room will not limit the allergens to that room. Don't pet, hug or kiss the dog or cat; if you do, wash your hands with soap and water. High-efficiency particulate air (HEPA) cleaners run continuously in a bedroom or living  room can reduce allergen levels over time. Regular use of a high-efficiency vacuum cleaner or a central vacuum can reduce allergen levels. Giving your dog or cat a bath at least once a week can reduce airborne allergen.  Control of Mold Allergen Mold and fungi can grow on a variety of surfaces provided certain temperature and moisture conditions exist.  Outdoor molds grow on plants, decaying vegetation and soil.  The major outdoor mold, Alternaria and Cladosporium, are found in very high numbers during hot and dry conditions.  Generally, a late Summer - Fall peak is seen for common outdoor fungal spores.  Rain will temporarily lower outdoor mold spore count, but counts rise rapidly when the rainy period ends.  The most important indoor molds are Aspergillus and Penicillium.  Dark, humid and poorly ventilated basements are ideal sites for mold growth.  The next most common sites of mold growth are the bathroom and the kitchen.  Outdoor [employer] Use air conditioning and keep windows closed Avoid exposure to decaying vegetation. Avoid leaf raking. Avoid grain handling.  Consider wearing a face mask if working in moldy areas.  Indoor Mold Control Maintain humidity below 50%. Clean washable surfaces with 5% bleach solution. Remove sources e.g. Contaminated carpets.

## 2023-07-24 ENCOUNTER — Encounter: Payer: Self-pay | Admitting: Oncology

## 2023-07-26 ENCOUNTER — Other Ambulatory Visit: Payer: Self-pay | Admitting: Primary Care

## 2023-07-26 DIAGNOSIS — I10 Essential (primary) hypertension: Secondary | ICD-10-CM

## 2023-07-26 DIAGNOSIS — E1165 Type 2 diabetes mellitus with hyperglycemia: Secondary | ICD-10-CM

## 2023-07-27 ENCOUNTER — Other Ambulatory Visit (HOSPITAL_COMMUNITY): Payer: Self-pay | Admitting: Pharmacy Technician

## 2023-07-27 ENCOUNTER — Telehealth: Payer: Self-pay

## 2023-07-27 NOTE — Telephone Encounter (Signed)
Auth Submission: APPROVED Site of care: Site of care: [facility] Payer: [employer] Medication & CPT/J Code(s) submitted: cabenuva [id number] Route of submission (phone, fax, portal): portal Phone # Fax # Auth type: Buy/Bill PB Units/visits requested: 600/900mg  q63months Reference number: [id number] Approval from: 07/27/23 to 07/26/24   2nd Insurance:  Auth Submission: NO AUTH NEEDED Site of care: Site of care: [facility] Payer: medicaid Medication & CPT/J Code(s) submitted: cabenuva [id number] Route of submission (phone, fax, portal): portal Phone # Fax # Auth type: Buy/Bill HB Units/visits requested: 600/900mg  q7months Reference number: [id number] Approval from: 07/27/23 to 07/10/24    Both are scanned in the media tab.

## 2023-07-28 ENCOUNTER — Encounter: Payer: 59 | Admitting: Primary Care

## 2023-07-31 ENCOUNTER — Telehealth: Payer: Self-pay | Admitting: Family Medicine

## 2023-07-31 MED ORDER — CROMOLYN SODIUM 4 % OP SOLN: OPHTHALMIC | 12 refills | Status: DC

## 2023-07-31 NOTE — Telephone Encounter (Signed)
Hunner called in and states he was supposed to have eye drops called in to his pharmacy and it wasn't.  Per Anne's note on 07/17/2023, she states that he is to  "Begin cromolyn eye drops, two drops in each eye up to 4 times a day as needed for red or itchy eyes."  He would like this called in to [facility] in [place]

## 2023-07-31 NOTE — Telephone Encounter (Signed)
Sent in Cromolyn eye drop prescription to [facility] per 07/17/23 office note:  Allergic conjunctivitis Begin cromolyn eye drops two drops in each eye up to 4 times a day as needed for red or itchy eyes

## 2023-08-07 ENCOUNTER — Telehealth: Payer: Self-pay

## 2023-08-07 NOTE — Progress Notes (Signed)
Care Guide Pharmacy Note  08/07/2023 Name: Steven [name] MRN: [id number] DOB: February 18, 1981  Referred By: Doreene Nest, NP Reason for referral: Care Coordination ([facility] Diabetes. )   Steven [name] is a 43 y.o. year old male who is a primary care patient of Doreene Nest, NP.  Steven [name] was referred to the pharmacist for assistance related to: DMII  An unsuccessful telephone outreach was attempted today to contact the patient who was referred to the pharmacy team for assistance with diabetes. Additional attempts will be made to contact the patient.  [name]  [facility], [facility] Health Care Management Assistant Direct Dial: [phone]  Fax: [phone]

## 2023-08-08 ENCOUNTER — Telehealth: Payer: Self-pay

## 2023-08-08 NOTE — Progress Notes (Signed)
Care Guide Pharmacy Note  08/08/2023 Name: [name] MRN: [id number] DOB: Nov 27, 1980  Referred By: Doreene Nest, NP Reason for referral: Care Coordination ([facility] Diabetes.)   Steven [name] is a 43 y.o. year old male who is a primary care patient of Doreene Nest, NP.  Antavious Spanos was referred to the pharmacist for assistance related to: DMII  Successful contact was made with the patient to discuss pharmacy services including being ready for the pharmacist to call at least 5 minutes before the scheduled appointment time and to have medication bottles and any blood pressure readings ready for review. The patient agreed to meet with the pharmacist via telephone visit on (date/time). 08/16/23 at 8:00 a.m.  [facility]  [facility] Health Care Management Assistant Direct Dial: [phone]  Fax: [phone]

## 2023-08-15 ENCOUNTER — Encounter: Payer: 59 | Admitting: Primary Care

## 2023-08-16 ENCOUNTER — Encounter: Payer: Self-pay | Admitting: Pharmacist

## 2023-08-16 ENCOUNTER — Other Ambulatory Visit: Payer: 59 | Admitting: Pharmacist

## 2023-08-16 NOTE — Progress Notes (Signed)
Attempted to contact patient for scheduled appointment for [facility] Diabetes education. Left HIPAA compliant message for patient to return my call at their convenience.

## 2023-08-16 NOTE — Progress Notes (Deleted)
 08/16/2023 Name: Steven [name] MRN: [id number] DOB: December 23, 1980  Subjective  No chief complaint on file.   Reason for visit: ?  Steven [name] is a 43 y.o. male with a history of diabetes (type 2), who presents today for {lzvisittype:31254} diabetes pharmacotherapy visit.? Pertinent PMH also includes ***.  Known DM Complications: {Blank multiple:19197::retinopathy,peripheral neuropathy,nephropathy,hypoglycemia unawareness,severe hypoglycemia,DKA,autonomic neuropathy,gastroparesis,amputation,significant fear of hypoglycemia,no known complications}   Care Team: Primary Care Provider: Gretta Comer POUR, NP   Date of Last Diabetes Related Visit: with PCP on 04/27/23   Recent Summary of Change: ***  Medication Access/Adherence: Prescription drug coverage: Payor: [employer] / Plan: [employer] OTHER / Product Type: *No Product type* / .  - Reports that all medications are *** affordable.  - Current Patient Assistance: None*** - Medication Adherence: Patient denies*** missing doses of their medication.    Since Last visit / History of Present Illness: ?  Patient reports implementing*** plan from last visit. Denies adverse effects with titration of ***.   Reported DM Regimen: ?   Glipizide  XL 10 mg daily metformin  XR 1000 mg twice daily    DM medications tried in the past:?  *** (***) *** (***) *** (***)  SMBG {Blank single:19197::Per BG meter,Per patient provided BG log,Per patient memory,***}: ?   Last Visit: AM fasting: 70-100 1 hour after dinner: 150-270   Hypo/Hyperglycemia: ?  Symptoms of hypoglycemia since last visit:? {Blank multiple:19197::yes,no,n/a,***}  If yes, it was treated by: {Blank multiple:19197::***,n/a}  Symptoms of hyperglycemia since last visit:? {Blank multiple:19197::yes,no,n/a,***} - {symptoms; hyperglycemia:17903}  Reported Diet: Patient typically eats *** meals per day.  Breakfast:  *** Lunch: *** Dinner: *** Snacks: *** Beverages: ***  Exercise: ***  DM Prevention:  Statin: {Blank single:19197::***,Taking,Not taking,Intolerant to,Declines}; {Blank single:19197::low intensity,moderate intensity,high intensity,n/a}.?  History of chronic kidney disease? {Blank single:19197::yes,no} History of albuminuria? {Blank single:19197::yes,no}, last UACR on *** = *** mg/g Lab Results  Component Value Date   MICRALBCREAT 22.5 08/23/2022   MICRALBCREAT 14.8 08/06/2021   MICRALBCREAT 5.4 12/26/2019   MICRALBCREAT 22 09/15/2017   MICRALBCREAT 31 (H) 07/20/2016   ACE/ARB - {Blank single:19197::***,Taking,Not taking,Intolerant to,Declines} ***; Urine MA/CR Ratio - {Blank single:19197::normal,elevated urinary albumin excretion,n/a,***}.  Last eye exam: ***; {lzdmeyeexam:31121} Lab Results  Component Value Date   HMDIABEYEEXA No Retinopathy 03/21/2022   Last foot exam: 08/23/2022 Tobacco Use: ***  Tobacco Use: Low Risk  (07/17/2023)   Patient History    Smoking Tobacco Use: Never    Smokeless Tobacco Use: Never    Passive Exposure: Not on file   Immunizations:? Flu: {LZDUEUPTODATE:31033} (Last: 04/10/2023); Pneumococcal: {LZVAXpneumococcal:31032:::0} - {LZVAXpneumo RISKAge19-64:31093}; Shingrix: {LZDUEUPTODATE:31033} (Last: , ); Covid (***)  Cardiovascular Risk Reduction History of clinical ASCVD? {Blank single:19197::yes,no} The ASCVD Risk score (Arnett DK, et al., 2019) failed to calculate for the following reasons:   The valid total cholesterol range is 130 to 320 mg/dL History of heart failure? {Blank single:19197::yes,no} {HF MEDS CURRENT:31255} History of hyperlipidemia? {Blank single:19197::yes,no} Current BMI: *** kg/m2 (Ht *** in, Wt *** kg) Taking statin? {Blank single:19197::yes,no,intolerant,declines}; {Blank single:19197::low intensity,moderate intensity,high intensity,n/a} (***) Taking  aspirin? {Blank single:19197::indicated (primary prevention),indicated (secondary prevention),not indicated,unclear if indicated}; {Blank single:19197::***,Taking,Not taking,Intolerant to,Declines}   Taking SGLT-2i? {Blank single:19197::yes,no} Taking GLP- 1 RA? {Blank single:19197::yes,no}   Reported HTN Regimen: ?  *** mg {LZ frequency:31544} *** mg {LZ frequency:31544} *** mg {LZ frequency:31544} *** mg {LZ frequency:31544} *** mg {LZ frequency:31544}   HTN medications tried in the past:?  *** (***)  Patient takes their blood pressure medications {Time; morning/afternoon/evening:19180} Patient {  Is/is not:9024} checking their blood pressure at home regularly.  Current blood pressure readings readings: {lzhomemonitoring:31119} Usually range ***/*** mmHg (morning)  Patient {Actions; denies-reports:120008} hypotensive s/sx. No dizziness, lightheadedness.  Patient {Actions; denies-reports:120008} hypertensive symptoms. No headache, chest pain, shortness of breath, visual changes.      _______________________________________________  Objective    Review of Systems:? Limited in the setting of virtual visit *** Constitutional:? No fever, chills or unintentional weight loss  Cardiovascular:? No chest pain or pressure, shortness of breath, dyspnea on exertion, orthopnea or LE edema  Pulmonary:? No cough or shortness of breath  GI:? No nausea, vomiting, constipation, diarrhea, abdominal pain, dyspepsia, change in bowel habits  Endocrine:? No polyuria, polyphagia or blurred vision  Psych:? No depression, anxiety, insomnia    Physical Examination:  Vitals:  Wt Readings from Last 3 Encounters:  07/17/23 293 lb 12.8 oz (133.3 kg)  06/06/23 297 lb 1.6 oz (134.8 kg)  04/27/23 293 lb (132.9 kg)   BP Readings from Last 3 Encounters:  07/17/23 (!) 140/80  06/06/23 (!) 157/95  04/27/23 132/88   Pulse Readings from Last 3 Encounters:  07/17/23 99  06/06/23 97   04/27/23 87     Labs:?  Lab Results  Component Value Date   HGBA1C 8.6 (A) 04/27/2023   HGBA1C 10.2 (A) 01/02/2023   HGBA1C 7.4 (H) 08/23/2022   GLUCOSE 130 (H) 06/06/2023   MICRALBCREAT 22.5 08/23/2022   MICRALBCREAT 14.8 08/06/2021   MICRALBCREAT 5.4 12/26/2019   CREATININE 1.07 06/06/2023   CREATININE 1.18 02/09/2023   CREATININE 1.18 12/28/2022   GFR 63.19 08/06/2021   GFR 73.66 10/29/2020   GFR 74.45 03/23/2018    Lab Results  Component Value Date   CHOL 117 06/06/2023   LDLCALC 52 06/06/2023   LDLCALC 57 05/25/2022   LDLCALC 57 08/06/2021   HDL 42 06/06/2023   TRIG 132 06/06/2023   TRIG 115 05/25/2022   TRIG 142.0 08/06/2021   ALT 28 06/06/2023   ALT 21 12/28/2022   AST 18 06/06/2023   AST 19 12/28/2022      Chemistry      Component Value Date/Time   NA 144 06/06/2023 1515   NA 142 03/13/2014 1327   K 3.7 06/06/2023 1515   K 3.6 03/13/2014 1327   CL 101 06/06/2023 1515   CL 101 09/20/2012 1202   CO2 24 06/06/2023 1515   CO2 29 03/13/2014 1327   BUN 11 06/06/2023 1515   BUN 14.3 03/13/2014 1327   CREATININE 1.07 06/06/2023 1515   CREATININE 1.27 05/25/2022 0836   CREATININE 1.6 (H) 03/13/2014 1327      Component Value Date/Time   CALCIUM  9.8 06/06/2023 1515   CALCIUM  9.5 03/13/2014 1327   ALKPHOS 65 06/06/2023 1515   ALKPHOS 66 03/13/2014 1327   AST 18 06/06/2023 1515   AST 26 03/13/2014 1327   ALT 28 06/06/2023 1515   ALT 26 03/13/2014 1327   BILITOT 0.7 06/06/2023 1515   BILITOT 2.22 (H) 03/13/2014 1327       The ASCVD Risk score (Arnett DK, et al., 2019) failed to calculate for the following reasons:   The valid total cholesterol range is 130 to 320 mg/dL  Assessment and Plan:   1. Diabetes, {Blank single:19197::type 1,type 2,LADA,MODY,post-transplant}: {CHL Controlled/Uncontrolled:8184545032} per last A1c of ***% (***), {Increased/decreased/na:15831} from previous ***% (***) with goal <7% without hypoglycemia.  Current  Regimen ***   {lzcgmsmbg:31042} data shows ***?. Diet: *** Exercise: ***  HCM: {LZ HCM DM2:31604}   {lzcgmsmbg:31042}  data shows ***?. Current Regimen: *** Diet: *** Exercise: ***  HCM: {LZ HCM DM2:31604} Continue medications today without changes.  ***  Reviewed s/sx/tx hypoglycemia Next A1c due *** Future Consideration: GLP1-RA: *** SGLT2i: *** Metformin : {LZ AP metformin :31605}  SU: Not unreasonable, though defer given alternative options with lower risk of hypoglycemia.  TZD: Avoiding due to possible weight gain/increase in fracture risk. ***HF Insulin: Not unreasonable, though defer as last resort or as needed for severe hyperglycemia given risk hypoglycemia. Other safer options at this time.     2. HTN: {CHL Controlled/Uncontrolled:[phone]} based on last clinic BP of *** mmHg (***), goal <130/80 mmHg. Does not *** monitor BP at home. Denies lightheadedness, dizziness, SOB, CP, vision changes.  Current Regimen: *** Continue medications without changes.  BP Readings from Last 3 Encounters:  07/17/23 (!) 140/80  06/06/23 (!) 157/95  04/27/23 132/88     3. ASCVD (*** prevention): LDL {CHL DESC; PROGRESS TOWARD GOAL:21523} on last lipid panel with LDL *** mg/dL, TG *** mg/dL (***). LDL goal {ldl goal:31310}.  Key risk factors include: {cvs risk:31001} The ASCVD Risk score (Arnett DK, et al., 2019) failed to calculate for the following reasons:   The valid total cholesterol range is 130 to 320 mg/dL indicated patient is at *** risk.  PREVENT: 10-Yr CAD ***%, 30-Yr CAD ***% indicated patient is at *** risk.  (those without ASCVD, CHF), Age 71-79 (includes additional metabolic risk factors, BMI, GFR, UACR, no race) Current Regimen: *** mg daily Continue medications today without changes.  Lab Results  Component Value Date   LDLCALC 52 06/06/2023   LDLCALC 57 05/25/2022   TRIG 132 06/06/2023   TRIG 115 05/25/2022     4. Healthcare Maintenance:  Pneumococcal -  Current status: ***  Shingles - Current status: *** Influenza - Current status: ***  Due to receive the following vaccines: {LZAPIMMUNIZATION:31028}   Follow Up Follow up with clinical pharmacist via *** in *** weeks Patient given direct line for questions regarding medication therapy and MAP applications***  Future Appointments  Date Time Provider Department Center  08/17/2023  7:40 AM Gretta Comer POUR, NP [facility]  01/15/2024  2:40 PM Ambs, Arlean HERO, FNP [facility] None  02/12/2024  8:00 AM Thapa, Sudan, MD [facility] None    Manuelita FABIENE Kobs, PharmD Clinical Pharmacist [facility] [phone]

## 2023-08-17 ENCOUNTER — Ambulatory Visit (INDEPENDENT_AMBULATORY_CARE_PROVIDER_SITE_OTHER): Payer: 59 | Admitting: Primary Care

## 2023-08-17 ENCOUNTER — Encounter: Payer: Self-pay | Admitting: Primary Care

## 2023-08-17 VITALS — BP 136/88 | HR 81 | Temp 97.3°F | Ht 72.0 in | Wt 292.0 lb

## 2023-08-17 DIAGNOSIS — E890 Postprocedural hypothyroidism: Secondary | ICD-10-CM

## 2023-08-17 DIAGNOSIS — J302 Other seasonal allergic rhinitis: Secondary | ICD-10-CM

## 2023-08-17 DIAGNOSIS — E785 Hyperlipidemia, unspecified: Secondary | ICD-10-CM

## 2023-08-17 DIAGNOSIS — Z0000 Encounter for general adult medical examination without abnormal findings: Secondary | ICD-10-CM | POA: Diagnosis not present

## 2023-08-17 DIAGNOSIS — J4550 Severe persistent asthma, uncomplicated: Secondary | ICD-10-CM

## 2023-08-17 DIAGNOSIS — Z23 Encounter for immunization: Secondary | ICD-10-CM

## 2023-08-17 DIAGNOSIS — E119 Type 2 diabetes mellitus without complications: Secondary | ICD-10-CM

## 2023-08-17 DIAGNOSIS — J3089 Other allergic rhinitis: Secondary | ICD-10-CM

## 2023-08-17 DIAGNOSIS — B20 Human immunodeficiency virus [HIV] disease: Secondary | ICD-10-CM

## 2023-08-17 DIAGNOSIS — I10 Essential (primary) hypertension: Secondary | ICD-10-CM

## 2023-08-17 DIAGNOSIS — Z7984 Long term (current) use of oral hypoglycemic drugs: Secondary | ICD-10-CM

## 2023-08-17 DIAGNOSIS — E1165 Type 2 diabetes mellitus with hyperglycemia: Secondary | ICD-10-CM

## 2023-08-17 DIAGNOSIS — K219 Gastro-esophageal reflux disease without esophagitis: Secondary | ICD-10-CM

## 2023-08-17 DIAGNOSIS — H1010 Acute atopic conjunctivitis, unspecified eye: Secondary | ICD-10-CM

## 2023-08-17 LAB — POCT GLYCOSYLATED HEMOGLOBIN (HGB A1C): Hemoglobin A1C: 6.8 % — AB (ref 4.0–5.6)

## 2023-08-17 LAB — MICROALBUMIN / CREATININE URINE RATIO
Creatinine,U: 226.6 mg/dL
Microalb Creat Ratio: 18.1 mg/g (ref 0.0–30.0)
Microalb, Ur: 41 mg/dL — ABNORMAL HIGH (ref 0.0–1.9)

## 2023-08-17 MED ORDER — METFORMIN HCL ER 500 MG PO TB24: 500.0000 mg | ORAL_TABLET | Freq: Two times a day (BID) | ORAL | Status: DC

## 2023-08-17 MED ORDER — SEMAGLUTIDE (1 MG/DOSE) 4 MG/3ML ~~LOC~~ SOPN: 1.0000 mg | PEN_INJECTOR | SUBCUTANEOUS | 0 refills | Status: DC

## 2023-08-17 NOTE — Assessment & Plan Note (Signed)
 Controlled.  Continue Nexium  40 mg as needed.

## 2023-08-17 NOTE — Assessment & Plan Note (Signed)
 Stable.  Following with allergy and asthma.  Office notes reviewed from January 2025.  Continue Singulair  10 mg daily, Advair  inhaler 230-21 mcg 2 puffs twice daily, Flonase , Allegra  180 mg daily, Nexium  40 mg daily, albuterol  inhaler as needed. Pending Xolair  injections per insurance approval.

## 2023-08-17 NOTE — Assessment & Plan Note (Signed)
Tetanus provided today.  Discussed the importance of a healthy diet and regular exercise in order for weight loss, and to reduce the risk of further co-morbidity.  Exam stable. Labs pending.  Follow up in 1 year for repeat physical.

## 2023-08-17 NOTE — Progress Notes (Signed)
 Subjective:    Patient ID: Steven [name], male    DOB: 31-Dec-1980, 43 y.o.   MRN: [id number]  HPI  Steven [name] is a very pleasant 43 y.o. male who presents today for complete physical and follow up of chronic conditions.  Immunizations: -Tetanus: Completed in 2015 -Influenza: Completed this season   -Pneumonia: Completed Prevnar 13 in 2019, Pneumovax 23 in 2022  Diet: Fair diet.  Exercise: No regular exercise.  Eye exam: Completes annually  Dental exam: Completed several years ago    BP Readings from Last 3 Encounters:  08/17/23 136/88  07/17/23 (!) 140/80  06/06/23 (!) 157/95   Wt Readings from Last 3 Encounters:  08/17/23 292 lb (132.5 kg)  07/17/23 293 lb 12.8 oz (133.3 kg)  06/06/23 297 lb 1.6 oz (134.8 kg)        Review of Systems  Constitutional:  Negative for unexpected weight change.  HENT:  Negative for rhinorrhea.   Respiratory:  Negative for cough and shortness of breath.   Cardiovascular:  Negative for chest pain.  Gastrointestinal:  Negative for constipation and diarrhea.  Genitourinary:  Negative for difficulty urinating.  Musculoskeletal:  Negative for arthralgias and myalgias.  Skin:  Negative for rash.  Allergic/Immunologic: Negative for environmental allergies.  Neurological:  Negative for dizziness, numbness and headaches.  Psychiatric/Behavioral:  The patient is not nervous/anxious.          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   Qualifier: Diagnosis of   By: Yong MD, Nneka         Syphilis 03/23/2009   1:2   Thrush, oral 03/23/2015    Social History   Socioeconomic History   Marital status: Single    Spouse name: Not on file   Number of children: Not on file   Years of education: Not on file    Highest education level: Not on file  Occupational History   Not on file  Tobacco Use   Smoking status: Never   Smokeless tobacco: Never  Vaping Use   Vaping status: Never Used  Substance and Sexual Activity   Alcohol use: Yes    Alcohol/week: 0.0 standard drinks of alcohol    Comment: socially - less now   Drug use: No   Sexual activity: Not Currently    Partners: Male    Birth control/protection: Condom    Comment: pt. declined condoms  Other Topics Concern   Not on file  Social History Narrative   Single.    No children.   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    Social Drivers of Corporate Investment Banker Strain: Low Risk  (01/02/2023)   Overall Financial Resource Strain (CARDIA)    Difficulty of Paying Living Expenses: Not very hard  Food Insecurity: Not on file  Transportation Needs: No Transportation Needs (01/02/2023)   PRAPARE - Administrator, Civil Service (Medical): No    Lack of Transportation (Non-Medical): No  Physical Activity: Insufficiently Active (01/02/2023)   Exercise Vital Sign    Days of Exercise per Week: 2 days    Minutes of Exercise per Session: 20 min  Stress: Patient Declined (01/02/2023)   [employer] of Occupational Health -  Occupational Stress Questionnaire    Feeling of Stress : Patient declined  Social Connections: Unknown (01/02/2023)   Social Connection and Isolation Panel [NHANES]    Frequency of Communication with Friends and Family: Not on file    Frequency of Social Gatherings with Friends and Family: Not on file    Attends Religious Services: Not on file    Active Member of Clubs or Organizations: No    Attends Banker Meetings: Not on file    Marital Status: Not on file  Intimate Partner Violence: Unknown (10/15/2021)   Received from [facility]   HITS    Physically Hurt: Not on file    Insult or Talk Down To: Not on file    Threaten Physical Harm: Not on file     Scream or Curse: Not on file    Past Surgical History:  Procedure Laterality Date   BRAIN SURGERY     IR GENERIC HISTORICAL  02/12/2016   IR REMOVAL TUN ACCESS W/ PORT W/O FL MOD SED 02/12/2016 Juliene Balder, MD [facility]-INTER[facility]    Family History  Problem Relation Age of Onset   Pancreatic cancer Mother    Cancer Mother    Diabetes Paternal Grandmother    Thyroid  disease Paternal Grandmother     Allergies  Allergen Reactions   Lisinopril Swelling    Swelling of lower lip while on lisinopril   Penicillins Hives and Swelling    REACTION: rash and swelling   Levaquin [Levofloxacin In D5w] Rash   Tessalon [Benzonatate] Rash   Aspirin Nausea Only    Current Outpatient Medications on File Prior to Visit  Medication Sig Dispense Refill   abacavir -dolutegravir -lamiVUDine (TRIUMEQ ) 600-50-300 MG tablet Take 1 tablet by mouth daily. 90 tablet 3   albuterol  (VENTOLIN  HFA) 108 (90 Base) MCG/ACT inhaler Inhale 2 puffs every 4-6 hours as as needed for cough, wheeze, tightness in chest, or shortness of breath 18 g 1   amLODipine  (NORVASC ) 10 MG tablet Take 1 tablet (10 mg total) by mouth daily. for blood pressure. 90 tablet 0   Blood Glucose Monitoring Suppl DEVI 1 each by Does not apply route in the morning, at noon, and at bedtime. May substitute to any manufacturer covered by patient's insurance. 1 each 0   ciclopirox  (PENLAC ) 8 % solution APPLY 1 COAT TO TOENAIL EVERY DAY FOR 48 WEEKS. REMOVE WEEKLY WITH POLISH REMOVER 6.6 mL 11   cromolyn  (OPTICROM ) 4 % ophthalmic solution 2 drops in each eye 1 - 4 times daily as needed for red, itchy eyes. 10 mL 12   EPINEPHrine  0.3 mg/0.3 mL IJ SOAJ injection INJECT 0.3 MG IN THE MUSCLE AS NEEDED FOR ANAPHYLAXIS 2 each 1   esomeprazole  (NEXIUM ) 40 MG capsule Take 1 capsule (40 mg total) by mouth daily. 30 capsule 5   fexofenadine  (ALLEGRA ) 180 MG tablet Take 1 tablet (180 mg total) by mouth daily. 15 tablet 0   fluticasone  (FLONASE ) 50 MCG/ACT nasal spray SHAKE  WELL USE 1 OR 2 SPRAYS IN EACH NOSTRIL EVERYDAY FOR NASAL CONGESTION 16 mL 5   fluticasone -salmeterol (ADVAIR  HFA) 230-21 MCG/ACT inhaler INHALE 2 PUFFS BY MOUTH TWICE DAILY 12 g 5   glipiZIDE  (GLUCOTROL  XL) 10 MG 24 hr tablet TAKE 1 TABLET(10 MG) BY MOUTH DAILY WITH BREAKFAST FOR DIABETES 90 tablet 0   Glucose Blood (BLOOD GLUCOSE TEST STRIPS) STRP 1 each by In Vitro route in the morning, at noon, and at bedtime. May substitute to any manufacturer  covered by patient's insurance. 300 strip 0   ibuprofen  (ADVIL ) 800 MG tablet Take 800 mg by mouth 3 (three) times daily as needed.     LANCETS MICRO THIN 33G MISC USE TO TEST BLOOD SUGAR ONCE A DAY 100 each 2   Lancets Misc. MISC 1 each by Does not apply route in the morning, at noon, and at bedtime. May substitute to any manufacturer covered by patient's insurance. 300 each 0   levothyroxine  (SYNTHROID ) 150 MCG tablet Take 1 tablet (150 mcg total) by mouth daily before breakfast. And additional 1/2 tab once a week. 92 tablet 4   montelukast  (SINGULAIR ) 10 MG tablet TAKE 1 TABLET(10 MG) BY MOUTH DAILY 90 tablet 1   Multiple Vitamin (MULTIVITAMIN) tablet Take 1 tablet by mouth daily.     rosuvastatin  (CRESTOR ) 5 MG tablet TAKE 1 TAB EVERY DAY BY MOUTH FOR CHOLESTEROL 90 tablet 0   spironolactone  (ALDACTONE ) 25 MG tablet TAKE 1 TABLET(25 MG) BY MOUTH DAILY FOR BLOOD PRESSURE 90 tablet 0   Current Facility-Administered Medications on File Prior to Visit  Medication Dose Route Frequency Provider Last Rate Last Admin   omalizumab  (XOLAIR ) injection 300 mg  300 mg Subcutaneous Q14 Days Luke Needle M, DO   300 mg at 10/12/22 1236    BP 136/88   Pulse 81   Temp (!) 97.3 F (36.3 C) (Temporal)   Ht 6' (1.829 m)   Wt 292 lb (132.5 kg)   SpO2 98%   BMI 39.60 kg/m  Objective:   Physical Exam HENT:     Right Ear: Tympanic membrane and ear canal normal.     Left Ear: Tympanic membrane and ear canal normal.  Eyes:     Pupils: Pupils are equal, round, and  reactive to light.  Cardiovascular:     Rate and Rhythm: Normal rate and regular rhythm.  Pulmonary:     Effort: Pulmonary effort is normal.     Breath sounds: Normal breath sounds.  Abdominal:     General: Bowel sounds are normal.     Palpations: Abdomen is soft.     Tenderness: There is no abdominal tenderness.  Musculoskeletal:        General: Normal range of motion.     Cervical back: Neck supple.  Skin:    General: Skin is warm and dry.  Neurological:     Mental Status: He is alert and oriented to person, place, and time.     Cranial Nerves: No cranial nerve deficit.     Deep Tendon Reflexes:     Reflex Scores:      Patellar reflexes are 2+ on the right side and 2+ on the left side. Psychiatric:        Mood and Affect: Mood normal.           Assessment & Plan:  Preventative health care Assessment & Plan: Tetanus provided today.  Discussed the importance of a healthy diet and regular exercise in order for weight loss, and to reduce the risk of further co-morbidity.  Exam stable. Labs pending.  Follow up in 1 year for repeat physical.    Essential hypertension Assessment & Plan: Stable.  Continue spironolactone  25 mg daily, amlodipine  10 mg daily. CMP reviewed from November 2024.   Type 2 diabetes mellitus with hyperglycemia, without long-term current use of insulin (HCC) Assessment & Plan: Improved with A1c of 6.8!  Discussed his treatment regimen today.  Increase Ozempic  to 1 mg weekly. Reduce metformin  ER to  500 mg twice daily. Continue glipizide  XL 10 mg daily with a goal of potential discontinuation in the future.  Follow-up in 3 months.  Orders: -     Microalbumin / creatinine urine ratio -     POCT glycosylated hemoglobin (Hb A1C) -     Semaglutide  (1 MG/DOSE); Inject 1 mg as directed once a week. for diabetes.  Dispense: 9 mL; Refill: 0  Seasonal and perennial allergic rhinoconjunctivitis Assessment & Plan: Stable.  Following with  allergy and asthma.  Office notes reviewed from January 2025.  Continue Singulair  10 mg daily, Advair  inhaler 230-21 mcg 2 puffs twice daily, Flonase , Allegra  180 mg daily, Nexium  40 mg daily, albuterol  inhaler as needed. Pending Xolair  injections per insurance approval.   Severe persistent asthma without complication Assessment & Plan: Controlled.  Continue Advair  230-21 mcg, 2 puffs twice daily, Singulair  10 mg daily, albuterol  inhaler as needed.   Gastroesophageal reflux disease, unspecified whether esophagitis present Assessment & Plan: Controlled.  Continue Nexium  40 mg as needed.   Hypothyroidism, postradioiodine therapy Assessment & Plan: Following with endocrinology. Office notes and labs reviewed from August 2024.   Currently asymptomatic HIV infection, with history of HIV-related illness [facility]) Assessment & Plan: Following with infectious disease, office notes and labs reviewed from November 2024. Continue Triumeq  600-50-300 mg daily.   Hyperlipidemia, unspecified hyperlipidemia type Assessment & Plan: Controlled.  Reviewed lipid panel from November 2024.  Continue rosuvastatin  5 mg daily.   Encounter for immunization -     Tdap vaccine greater than or equal to 7yo IM  Type 2 diabetes mellitus without complication, without long-term current use of insulin (HCC) -     metFORMIN  HCl ER; Take 1 tablet (500 mg total) by mouth 2 (two) times daily with a meal. TAKE 2 TABLETS BY MOUTH IN THE MORNING AND IN THE EVENING FOR DIABETES        Comer MARLA Gaskins, NP

## 2023-08-17 NOTE — Patient Instructions (Signed)
 We increased your Ozempic  to 1 mg weekly for diabetes.  Reduce your metformin  pill to 1 in the morning and 1 in the evening.  Stop by the lab prior to leaving today. I will notify you of your results once received.   Please schedule a follow up visit for 3 months.  It was a pleasure to see you today!

## 2023-08-17 NOTE — Assessment & Plan Note (Signed)
 Controlled.  Reviewed lipid panel from November 2024.  Continue rosuvastatin  5 mg daily.

## 2023-08-17 NOTE — Assessment & Plan Note (Signed)
 Controlled.  Continue Advair  230-21 mcg, 2 puffs twice daily, Singulair  10 mg daily, albuterol  inhaler as needed.

## 2023-08-17 NOTE — Assessment & Plan Note (Signed)
 Following with infectious disease, office notes and labs reviewed from November 2024. Continue Triumeq  600-50-300 mg daily.

## 2023-08-17 NOTE — Assessment & Plan Note (Signed)
 Following with endocrinology. Office notes and labs reviewed from August 2024.

## 2023-08-17 NOTE — Assessment & Plan Note (Addendum)
 Stable.  Continue spironolactone  25 mg daily, amlodipine  10 mg daily. CMP reviewed from November 2024.

## 2023-08-17 NOTE — Assessment & Plan Note (Signed)
 Improved with A1c of 6.8!  Discussed his treatment regimen today.  Increase Ozempic  to 1 mg weekly. Reduce metformin  ER to 500 mg twice daily. Continue glipizide  XL 10 mg daily with a goal of potential discontinuation in the future.  Follow-up in 3 months.

## 2023-08-18 ENCOUNTER — Telehealth: Payer: Self-pay | Admitting: Infectious Diseases

## 2023-08-18 NOTE — Telephone Encounter (Signed)
 Called pt and left VM that he will be contacted about starting cabaneuva He will need to stop his triumeq  on the day he start cabaneuva

## 2023-08-21 ENCOUNTER — Telehealth: Payer: Self-pay | Admitting: Primary Care

## 2023-08-21 NOTE — Telephone Encounter (Signed)
 Patient's A1C was collected on 08/17/23. See results.

## 2023-08-21 NOTE — Telephone Encounter (Signed)
 Patient was identified as falling into the [place] Measure - Diabetes.   Do not see where recent A1C has been checked. Ok to call patient and have come in for lab only to follow up?

## 2023-08-23 NOTE — Telephone Encounter (Signed)
All notes updated.

## 2023-08-24 ENCOUNTER — Telehealth: Payer: Self-pay | Admitting: *Deleted

## 2023-08-24 NOTE — Telephone Encounter (Signed)
-----   Message from Thermon Leyland sent at 07/17/2023  5:05 PM EST ----- Hi Steven [name], This patient has a new insurance and is interested in restarting Xolair for asthma. He has not been using a duel inhaler due to new insurance. Thank you

## 2023-08-24 NOTE — Telephone Encounter (Signed)
L/m for patient to contact me regarding Xolair approval

## 2023-08-25 ENCOUNTER — Other Ambulatory Visit: Payer: Self-pay | Admitting: Infectious Diseases

## 2023-08-25 ENCOUNTER — Encounter: Payer: Self-pay | Admitting: Infectious Diseases

## 2023-08-25 ENCOUNTER — Inpatient Hospital Stay (HOSPITAL_COMMUNITY): Admission: RE | Admit: 2023-08-25 | Payer: 59 | Source: Ambulatory Visit

## 2023-08-25 ENCOUNTER — Other Ambulatory Visit (HOSPITAL_COMMUNITY): Payer: Self-pay | Admitting: Pharmacy Technician

## 2023-08-25 DIAGNOSIS — B20 Human immunodeficiency virus [HIV] disease: Secondary | ICD-10-CM

## 2023-08-25 DIAGNOSIS — R609 Edema, unspecified: Secondary | ICD-10-CM

## 2023-08-25 DIAGNOSIS — N181 Chronic kidney disease, stage 1: Secondary | ICD-10-CM

## 2023-08-25 DIAGNOSIS — Z21 Asymptomatic human immunodeficiency virus [HIV] infection status: Secondary | ICD-10-CM

## 2023-08-25 DIAGNOSIS — C469 Kaposi's sarcoma, unspecified: Secondary | ICD-10-CM

## 2023-08-28 ENCOUNTER — Telehealth: Payer: Self-pay

## 2023-08-28 ENCOUNTER — Ambulatory Visit (HOSPITAL_COMMUNITY)
Admission: RE | Admit: 2023-08-28 | Discharge: 2023-08-28 | Disposition: A | Discharge status: Home / Self Care | Payer: 59 | Source: Ambulatory Visit | Attending: Infectious Diseases | Admitting: Infectious Diseases

## 2023-08-28 DIAGNOSIS — Z21 Asymptomatic human immunodeficiency virus [HIV] infection status: Secondary | ICD-10-CM | POA: Diagnosis not present

## 2023-08-28 DIAGNOSIS — B20 Human immunodeficiency virus [HIV] disease: Secondary | ICD-10-CM | POA: Insufficient documentation

## 2023-08-28 MED ORDER — CABOTEGRAVIR & RILPIVIRINE ER 600 & 900 MG/3ML IM SUER
1.0000 | INTRAMUSCULAR | Status: DC
  Administered 2023-08-28: 1 via INTRAMUSCULAR
  Filled 2023-08-28: qty 6

## 2023-08-28 NOTE — Telephone Encounter (Signed)
Spoke to patient and advised submit [employer] for Xolair will be in touch to restart

## 2023-08-28 NOTE — Telephone Encounter (Signed)
Patient was identified as falling into the [place] Measure - Diabetes.   Patient was: Appointment scheduled for lab or office visit for A1c.  Patient had an A1C 08/17/23 was 6.8.  No further follow up.

## 2023-09-23 ENCOUNTER — Other Ambulatory Visit: Payer: Self-pay | Admitting: Family

## 2023-09-27 DIAGNOSIS — E1165 Type 2 diabetes mellitus with hyperglycemia: Secondary | ICD-10-CM

## 2023-09-27 MED ORDER — SEMAGLUTIDE (1 MG/DOSE) 4 MG/3ML ~~LOC~~ SOPN: 1.0000 mg | PEN_INJECTOR | SUBCUTANEOUS | 0 refills | Status: DC

## 2023-10-02 ENCOUNTER — Ambulatory Visit: Admitting: Podiatry

## 2023-10-02 ENCOUNTER — Encounter: Payer: Self-pay | Admitting: Podiatry

## 2023-10-02 VITALS — Ht 72.0 in | Wt 292.0 lb

## 2023-10-02 DIAGNOSIS — E1122 Type 2 diabetes mellitus with diabetic chronic kidney disease: Secondary | ICD-10-CM

## 2023-10-02 DIAGNOSIS — E119 Type 2 diabetes mellitus without complications: Secondary | ICD-10-CM

## 2023-10-02 DIAGNOSIS — M2142 Flat foot [pes planus] (acquired), left foot: Secondary | ICD-10-CM

## 2023-10-02 DIAGNOSIS — B351 Tinea unguium: Secondary | ICD-10-CM | POA: Diagnosis not present

## 2023-10-02 DIAGNOSIS — M79676 Pain in unspecified toe(s): Secondary | ICD-10-CM

## 2023-10-02 DIAGNOSIS — I890 Lymphedema, not elsewhere classified: Secondary | ICD-10-CM

## 2023-10-02 DIAGNOSIS — N182 Chronic kidney disease, stage 2 (mild): Secondary | ICD-10-CM

## 2023-10-02 DIAGNOSIS — L84 Corns and callosities: Secondary | ICD-10-CM | POA: Diagnosis not present

## 2023-10-02 DIAGNOSIS — M2141 Flat foot [pes planus] (acquired), right foot: Secondary | ICD-10-CM | POA: Diagnosis not present

## 2023-10-04 ENCOUNTER — Other Ambulatory Visit: Payer: Self-pay

## 2023-10-04 ENCOUNTER — Ambulatory Visit (HOSPITAL_COMMUNITY)
Admission: EM | Admit: 2023-10-04 | Discharge: 2023-10-04 | Disposition: A | Discharge status: Home / Self Care | Attending: Physician Assistant | Admitting: Physician Assistant

## 2023-10-04 ENCOUNTER — Encounter (HOSPITAL_COMMUNITY): Payer: Self-pay | Admitting: Emergency Medicine

## 2023-10-04 DIAGNOSIS — T7840XA Allergy, unspecified, initial encounter: Secondary | ICD-10-CM | POA: Diagnosis not present

## 2023-10-04 DIAGNOSIS — E119 Type 2 diabetes mellitus without complications: Secondary | ICD-10-CM

## 2023-10-04 MED ORDER — PREDNISONE 20 MG PO TABS
ORAL_TABLET | ORAL | Status: AC
  Filled 2023-10-04: qty 2

## 2023-10-04 MED ORDER — FAMOTIDINE 20 MG PO TABS
40.0000 mg | ORAL_TABLET | Freq: Once | ORAL | Status: AC
  Administered 2023-10-04: 40 mg via ORAL

## 2023-10-04 MED ORDER — FAMOTIDINE 40 MG PO TABS
40.0000 mg | ORAL_TABLET | Freq: Every day | ORAL | 0 refills | Status: AC
Start: 2023-10-04 — End: ?

## 2023-10-04 MED ORDER — PREDNISONE 20 MG PO TABS: 40.0000 mg | ORAL_TABLET | Freq: Every day | ORAL | 0 refills | Status: AC

## 2023-10-04 MED ORDER — PREDNISONE 20 MG PO TABS
40.0000 mg | ORAL_TABLET | Freq: Once | ORAL | Status: AC
Start: 2023-10-04 — End: 2023-10-04
  Administered 2023-10-04: 40 mg via ORAL

## 2023-10-04 MED ORDER — FAMOTIDINE 20 MG PO TABS
ORAL_TABLET | ORAL | Status: AC
  Filled 2023-10-04: qty 2

## 2023-10-04 MED ORDER — METFORMIN HCL ER 500 MG PO TB24: 500.0000 mg | ORAL_TABLET | Freq: Two times a day (BID) | ORAL | 0 refills | Status: DC

## 2023-10-04 NOTE — Discharge Instructions (Addendum)
 I am glad that you are feeling better after the medication.  We are treating you for an allergic reaction.  We gave you prednisone today.  Please continue 4 more days of this starting tomorrow (10/05/2023).  Do not take NSAIDs with this medication including aspirin, ibuprofen/Advil, naproxen/Aleve.  This will raise your blood sugar so please drink lots of fluids particularly water and avoid carbohydrates including sugar.  Monitor your blood sugar regularly and if this is consistently above 200 or you develop any additional symptoms including nausea, vomiting, confusion, shortness of breath you need to be seen immediately.  If you have any worsening symptoms including swelling of your throat, shortness of breath, wheezing please use your EpiPen and then proceed to the emergency room as soon as possible.  Follow-up with your primary care.  Continue your Allegra daily and start famotidine at night.  Try to avoid any known triggers.  If anything worsens go to the ER as we discussed.

## 2023-10-04 NOTE — ED Triage Notes (Addendum)
 Bilateral eyes are itchy watery that started one hour ago.  Patient also reports hands itching and points to areas bilaterally on face as another area of feeling different.  Denies difficulty breathing.   took benadryl 25 mg  Patient appears anxious, NAD

## 2023-10-04 NOTE — ED Notes (Signed)
 Spoke to [place], [place] about patient

## 2023-10-04 NOTE — ED Provider Notes (Signed)
 [facility]    CSN: [id number] Arrival date & time: 10/04/23  1017      History   Chief Complaint Chief Complaint  Patient presents with   Eye Problem   Allergic Reaction    HPI Steven [name] is a 43 y.o. male.   Patient presents today with a several hour history of allergic reaction symptoms.  He reports itchy/watery eyes as well as widespread itching.  He does have a history of allergic reactions and is followed by allergy asthma center.  He last saw them in February 2025.  He does have a history of environmental and food allergies that have required EpiPen.  He does have this available but does not require use.  He denies any associated rash, swelling of his throat, shortness of breath, wheezing, nausea, vomiting, diarrhea.  He denies any new exposures but does report that he works in an office setting with many people and so could have been exposed to something else there.  He denies any recent steroid use.  He did take a Benadryl which provided temporary relief of symptoms but has not had additional over-the-counter medications.  He does have a history of diabetes but reports that his blood sugars are appropriately controlled with medications; his last A1c in February 2025 was 6.8%.    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   Qualifier: Diagnosis of   By: Narda Bonds MD, Nneka         Syphilis 03/23/2009   1:2   Thrush, oral 03/23/2015    Patient Active Problem List   Diagnosis Date Noted   CKD (chronic kidney disease), symptom management only, stage 1 06/06/2023   Edema 06/06/2023   H/O Graves' disease 02/24/2023   Routine screening for STI (sexually transmitted infection) 07/28/2022   Chronic shoulder pain 12/16/2021   Hypokalemia 08/06/2021    Preoperative clearance 10/29/2020   Hyperlipidemia 05/28/2020   Preventative health care 08/10/2018   Severe persistent asthma without complication 06/15/2018   Allergic reaction 06/15/2018   Food allergy 06/15/2018   Seasonal and perennial allergic rhinoconjunctivitis 06/15/2018   HIV infection (HCC) 02/09/2018   Gastroesophageal reflux disease 03/28/2017   Type 2 diabetes mellitus with hyperglycemia (HCC) 03/25/2015   Essential hypertension 11/24/2014   Hypothyroidism, 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 Authorizing Provider  famotidine (PEPCID) 40 MG tablet Take 1 tablet (40 mg total) by mouth daily. 10/04/23  Yes Agostino Gorin K, PA-C  predniSONE (DELTASONE) 20 MG tablet Take 2 tablets (40 mg total) by mouth daily for 4 days. 10/04/23 10/08/23 Yes Rashida Ladouceur, Noberto Retort, PA-C  abacavir-dolutegravir-lamiVUDine (TRIUMEQ) 600-50-300 MG tablet Take 1 tablet by mouth daily. 01/26/23   Ginnie Smart, MD  albuterol (VENTOLIN HFA)  108 (90 Base) MCG/ACT inhaler INHALE 2 PUFFS BY MOUTH CHEST EVERY 4 TO 6 HOURS AS NEEDED FOR COUGH OR WHEEZING OR TIGHTNESS OR SHORTNESS OF BREATH 09/25/23   Ambs, Norvel Richards, FNP  amLODipine (NORVASC) 10 MG tablet Take 1 tablet (10 mg total) by mouth daily. for blood pressure. 04/10/23   Doreene Nest, NP  Blood Glucose Monitoring Suppl DEVI 1 each by Does not apply route in the morning, at noon, and at bedtime. May substitute to any manufacturer covered by patient's insurance. 05/29/23   Doreene Nest, NP   ciclopirox (PENLAC) 8 % solution APPLY 1 COAT TO TOENAIL EVERY DAY FOR 48 WEEKS. REMOVE WEEKLY WITH POLISH REMOVER 06/22/21   Freddie Breech, DPM  cromolyn (OPTICROM) 4 % ophthalmic solution 2 drops in each eye 1 - 4 times daily as needed for red, itchy eyes. 07/31/23   Hetty Blend, FNP  EPINEPHrine 0.3 mg/0.3 mL IJ SOAJ injection INJECT 0.3 MG IN THE MUSCLE AS NEEDED FOR ANAPHYLAXIS 08/03/22   Nehemiah Settle, FNP  esomeprazole (NEXIUM) 40 MG capsule Take 1 capsule (40 mg total) by mouth daily. 07/17/23   Hetty Blend, FNP  fexofenadine (ALLEGRA) 180 MG tablet Take 1 tablet (180 mg total) by mouth daily. 08/03/22   Nehemiah Settle, FNP  fluticasone (FLONASE) 50 MCG/ACT nasal spray SHAKE WELL USE 1 OR 2 SPRAYS IN EACH NOSTRIL EVERYDAY FOR NASAL CONGESTION 01/27/23   Nehemiah Settle, FNP  fluticasone-salmeterol (ADVAIR HFA) [phone] MCG/ACT inhaler INHALE 2 PUFFS BY MOUTH TWICE DAILY 07/17/23   Ambs, Norvel Richards, FNP  glipiZIDE (GLUCOTROL XL) 10 MG 24 hr tablet TAKE 1 TABLET(10 MG) BY MOUTH DAILY WITH BREAKFAST FOR DIABETES 07/26/23   Doreene Nest, NP  Glucose Blood (BLOOD GLUCOSE TEST STRIPS) STRP 1 each by In Vitro route in the morning, at noon, and at bedtime. May substitute to any manufacturer covered by patient's insurance. 05/29/23   Doreene Nest, NP  ibuprofen (ADVIL) 800 MG tablet Take 800 mg by mouth 3 (three) times daily as needed. 01/20/21   [provider]  LANCETS MICRO THIN 33G MISC USE TO TEST BLOOD SUGAR ONCE A DAY 08/21/17   Quentin Angst, MD  Lancets Misc. MISC 1 each by Does not apply route in the morning, at noon, and at bedtime. May substitute to any manufacturer covered by patient's insurance. 05/29/23   Doreene Nest, NP  levothyroxine (SYNTHROID) 150 MCG tablet Take 1 tablet (150 mcg total) by mouth daily before breakfast. And additional 1/2 tab once a week. 02/24/23   Thapa, [place], MD  metFORMIN (GLUCOPHAGE-XR) 500 MG 24 hr tablet Take 1 tablet (500 mg  total) by mouth 2 (two) times daily with a meal. for diabetes. 10/04/23   Doreene Nest, NP  montelukast (SINGULAIR) 10 MG tablet TAKE 1 TABLET(10 MG) BY MOUTH DAILY 08/03/22   Nehemiah Settle, FNP  Multiple Vitamin (MULTIVITAMIN) tablet Take 1 tablet by mouth daily.    [provider]  rosuvastatin (CRESTOR) 5 MG tablet TAKE 1 TAB EVERY DAY BY MOUTH FOR CHOLESTEROL 02/14/23   Doreene Nest, NP  Semaglutide, 1 MG/DOSE, 4 MG/3ML SOPN Inject 1 mg as directed once a week. for diabetes. 09/27/23   Doreene Nest, NP  spironolactone (ALDACTONE) 25 MG tablet TAKE 1 TABLET(25 MG) BY MOUTH DAILY FOR BLOOD PRESSURE 07/26/23   Doreene Nest, NP    Family History Family History  Problem Relation Age of Onset   Pancreatic cancer  Mother    Cancer Mother    Diabetes Paternal Grandmother    Thyroid disease Paternal Grandmother     Social History Social History   Tobacco Use   Smoking status: Never   Smokeless tobacco: Never  Vaping Use   Vaping status: Never Used  Substance Use Topics   Alcohol use: Yes    Alcohol/week: 0.0 standard drinks of alcohol    Comment: socially - less now   Drug use: No     Allergies   Lisinopril, Penicillins, Levaquin [levofloxacin in d5w], Tessalon [benzonatate], Mushroom, Shellfish allergy, and Aspirin   Review of Systems Review of Systems  Constitutional:  Negative for activity change, appetite change, fatigue and fever.  Respiratory:  Negative for cough and shortness of breath.   Cardiovascular:  Negative for chest pain.  Gastrointestinal:  Negative for abdominal pain, diarrhea, nausea and vomiting.  Skin:  Negative for rash.     Physical Exam Triage Vital Signs ED Triage Vitals  Encounter Vitals Group     BP 10/04/23 1129 (!) 141/87     Systolic BP Percentile --      Diastolic BP Percentile --      Pulse Rate 10/04/23 1129 95     Resp 10/04/23 1129 20     Temp 10/04/23 1129 98.8 F (37.1 C)     Temp Source 10/04/23 1129  Oral     SpO2 10/04/23 1129 95 %     Weight --      Height --      Head Circumference --      Peak Flow --      Pain Score 10/04/23 1125 0     Pain Loc --      Pain Education --      Exclude from Growth Chart --    No data found.  Updated Vital Signs BP (!) 144/93 (BP Location: Left Arm)   Pulse 88   Temp 98.8 F (37.1 C) (Oral)   Resp 20   SpO2 96%   Visual Acuity Right Eye Distance:   Left Eye Distance:   Bilateral Distance:    Right Eye Near:   Left Eye Near:    Bilateral Near:     Physical Exam Vitals reviewed.  Constitutional:      General: He is awake.     Appearance: Normal appearance. He is well-developed. He is not ill-appearing.     Comments: Very pleasant male appear stated age in no acute distress sitting comfortably in exam room  HENT:     Head: Normocephalic and atraumatic.     Right Ear: Tympanic membrane, ear canal and external ear normal. Tympanic membrane is not erythematous or bulging.     Left Ear: Tympanic membrane, ear canal and external ear normal. Tympanic membrane is not erythematous or bulging.     Nose: Nose normal.     Mouth/Throat:     Pharynx: Uvula midline. No oropharyngeal exudate, posterior oropharyngeal erythema or uvula swelling.  Cardiovascular:     Rate and Rhythm: Normal rate and regular rhythm.     Heart sounds: Normal heart sounds, S1 normal and S2 normal. No murmur heard. Pulmonary:     Effort: Pulmonary effort is normal. No accessory muscle usage or respiratory distress.     Breath sounds: Normal breath sounds. No stridor. No wheezing, rhonchi or rales.     Comments: Clear to auscultation Lymphadenopathy:     Head:     Right side of head: No submental,  submandibular or tonsillar adenopathy.     Left side of head: No submental, submandibular or tonsillar adenopathy.     Cervical: No cervical adenopathy.  Skin:    Findings: No rash. Rash is not urticarial.  Neurological:     Mental Status: He is alert.  Psychiatric:         Behavior: Behavior is cooperative.      UC Treatments / Results  Labs (all labs ordered are listed, but only abnormal results are displayed) Labs Reviewed - No data to display  EKG   Radiology No results found.  Procedures Procedures (including critical care time)  Medications Ordered in UC Medications  predniSONE (DELTASONE) tablet 40 mg (40 mg Oral Given 10/04/23 1202)  famotidine (PEPCID) tablet 40 mg (40 mg Oral Given 10/04/23 1202)    Initial Impression / Assessment and Plan / UC Course  I have reviewed the triage vital signs and the nursing notes.  Pertinent labs & imaging results that were available during my care of the patient were reviewed by me and considered in my medical decision making (see chart for details).     Patient is well-appearing, afebrile, nontoxic, nontachycardic.  No evidence of anaphylaxis on initial exam.  Concern for development of urticaria/allergic reaction given associated pruritus and clinical presentation.  He was given 40 mg of prednisone and famotidine in clinic; H1 blockade was started prior to arrival with over-the-counter Benadryl so additional medication was not provided.  He reported improvement of symptoms with this medication regimen so we will continue prednisone burst of 40 mg starting tomorrow (10/05/2023) for an additional 4 days.  We discussed that he is not to take NSAIDs with this medication.  Discussed that will raise his blood sugar so he should push fluids and avoid carbohydrates.  He is to monitor his blood sugar and if persistently above 200 or if he develops any additional symptoms he needs to be seen immediately.  Will continue H1 and H2 blockade.  He has an EpiPen available and we discussed that if his symptoms worsen in any way and he has shortness of breath, nausea, vomiting, rash he needs to be seen immediately in the emergency room.  Strict return precautions given.  All questions answered to patient satisfaction.   Excuse note provided.  Final Clinical Impressions(s) / UC Diagnoses   Final diagnoses:  Allergic reaction, initial encounter     Discharge Instructions      I am glad that you are feeling better after the medication.  We are treating you for an allergic reaction.  We gave you prednisone today.  Please continue 4 more days of this starting tomorrow (10/05/2023).  Do not take NSAIDs with this medication including aspirin, ibuprofen/Advil, naproxen/Aleve.  This will raise your blood sugar so please drink lots of fluids particularly water and avoid carbohydrates including sugar.  Monitor your blood sugar regularly and if this is consistently above 200 or you develop any additional symptoms including nausea, vomiting, confusion, shortness of breath you need to be seen immediately.  If you have any worsening symptoms including swelling of your throat, shortness of breath, wheezing please use your EpiPen and then proceed to the emergency room as soon as possible.  Follow-up with your primary care.  Continue your Allegra daily and start famotidine at night.  Try to avoid any known triggers.  If anything worsens go to the ER as we discussed.     ED Prescriptions     Medication Sig Dispense Auth. Provider  predniSONE (DELTASONE) 20 MG tablet Take 2 tablets (40 mg total) by mouth daily for 4 days. 8 tablet Kwame Ryland K, PA-C   famotidine (PEPCID) 40 MG tablet Take 1 tablet (40 mg total) by mouth daily. 30 tablet Talajah Slimp, Noberto Retort, PA-C      PDMP not reviewed this encounter.   Jeani Hawking, PA-C 10/04/23 1257

## 2023-10-06 NOTE — Progress Notes (Signed)
 ANNUAL DIABETIC FOOT EXAM  Subjective: Steven [name] presents today for annual diabetic foot exam. Chief Complaint  Patient presents with   Nail Problem    Pt is here for [facility] last A1C was 6.8 PCP is Dr Chestine Spore and LOV was in February.   Patient confirms h/o diabetes.  Patient denies any h/o foot wounds.  Patient has been diagnosed with neuropathy.  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   Qualifier: Diagnosis of   By: Narda Bonds MD, Nneka         Syphilis 03/23/2009   1:2   Thrush, oral 03/23/2015   Patient Active Problem List   Diagnosis Date Noted   CKD (chronic kidney disease), symptom management only, stage 1 06/06/2023   Edema 06/06/2023   H/O Graves' disease 02/24/2023   Routine screening for STI (sexually transmitted infection) 07/28/2022   Chronic shoulder pain 12/16/2021   Hypokalemia 08/06/2021   Preoperative clearance 10/29/2020   Hyperlipidemia 05/28/2020   Preventative health care 08/10/2018   Severe persistent asthma without complication 06/15/2018   Allergic reaction 06/15/2018   Food allergy 06/15/2018   Seasonal and perennial allergic rhinoconjunctivitis 06/15/2018   HIV infection (HCC) 02/09/2018   Gastroesophageal reflux disease 03/28/2017   Type 2 diabetes mellitus with hyperglycemia (HCC) 03/25/2015   Essential hypertension 11/24/2014   Hypothyroidism, 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 90 tablet 3   albuterol (VENTOLIN HFA) 108 (90 Base) MCG/ACT inhaler INHALE 2 PUFFS BY MOUTH CHEST EVERY 4 TO 6 HOURS AS NEEDED FOR COUGH OR WHEEZING OR TIGHTNESS OR SHORTNESS OF BREATH 8.5 g 2   amLODipine (NORVASC) 10 MG tablet Take 1 tablet (10 mg total) by mouth daily. for blood pressure. 90 tablet 0   Blood Glucose Monitoring Suppl DEVI 1 each by Does not apply route in the morning, at noon, and at bedtime. May substitute to any manufacturer covered by patient's insurance. 1 each 0   ciclopirox (PENLAC) 8 % solution APPLY 1 COAT TO TOENAIL EVERY DAY FOR 48 WEEKS. REMOVE WEEKLY WITH POLISH REMOVER 6.6 mL 11   cromolyn (OPTICROM) 4 % ophthalmic solution 2 drops in each eye 1 - 4 times daily as needed for red, itchy eyes. 10 mL 12   EPINEPHrine 0.3 mg/0.3 mL IJ SOAJ injection INJECT 0.3 MG IN THE MUSCLE AS NEEDED FOR ANAPHYLAXIS 2 each 1   esomeprazole (NEXIUM) 40 MG capsule Take 1 capsule (40 mg total) by mouth daily. 30 capsule 5   fexofenadine (ALLEGRA) 180 MG tablet Take 1 tablet (180 mg total) by mouth daily. 15 tablet 0   fluticasone (FLONASE) 50 MCG/ACT nasal spray SHAKE WELL USE 1 OR 2 SPRAYS IN EACH NOSTRIL EVERYDAY FOR NASAL  CONGESTION 16 mL 5   fluticasone-salmeterol (ADVAIR HFA) 230-21 MCG/ACT inhaler INHALE 2 PUFFS BY MOUTH TWICE DAILY 12 g 5   glipiZIDE (GLUCOTROL XL) 10 MG 24 hr tablet TAKE 1 TABLET(10 MG) BY MOUTH DAILY WITH BREAKFAST FOR DIABETES 90 tablet 0   Glucose Blood (BLOOD GLUCOSE TEST STRIPS) STRP 1 each by In Vitro route in the morning, at noon, and at bedtime. May substitute to any manufacturer covered by patient's insurance. 300 strip 0   ibuprofen (ADVIL) 800 MG tablet Take 800 mg by mouth 3 (three) times daily as needed.     LANCETS MICRO THIN 33G MISC USE TO TEST BLOOD  SUGAR ONCE A DAY 100 each 2   Lancets Misc. MISC 1 each by Does not apply route in the morning, at noon, and at bedtime. May substitute to any manufacturer covered by patient's insurance. 300 each 0   levothyroxine (SYNTHROID) 150 MCG tablet Take 1 tablet (150 mcg total) by mouth daily before breakfast. And additional 1/2 tab once a week. 92 tablet 4   montelukast (SINGULAIR) 10 MG tablet TAKE 1 TABLET(10 MG) BY MOUTH DAILY 90 tablet 1   Multiple Vitamin (MULTIVITAMIN) tablet Take 1 tablet by mouth daily.     rosuvastatin (CRESTOR) 5 MG tablet TAKE 1 TAB EVERY DAY BY MOUTH FOR CHOLESTEROL 90 tablet 0   Semaglutide, 1 MG/DOSE, 4 MG/3ML SOPN Inject 1 mg as directed once a week. for diabetes. 9 mL 0   spironolactone (ALDACTONE) 25 MG tablet TAKE 1 TABLET(25 MG) BY MOUTH DAILY FOR BLOOD PRESSURE 90 tablet 0   Current Facility-Administered Medications on File Prior to Visit  Medication Dose Route Frequency Provider Last Rate Last Admin   omalizumab Geoffry Paradise) injection 300 mg  300 mg Subcutaneous Q14 Days Wyline Mood M, DO   300 mg at 10/12/22 1236    Allergies  Allergen Reactions   Lisinopril Swelling    Swelling of lower lip while on lisinopril   Penicillins Hives and Swelling    REACTION: rash and swelling   Levaquin [Levofloxacin In D5w] Rash   Tessalon [Benzonatate] Rash   Mushroom Swelling   Shellfish Allergy Swelling   Aspirin Nausea Only   Social History   Occupational History   Not on file  Tobacco Use   Smoking status: Never   Smokeless tobacco: Never  Vaping Use   Vaping status: Never Used  Substance and Sexual Activity   Alcohol use: Yes    Alcohol/week: 0.0 standard drinks of alcohol    Comment: socially - less now   Drug use: No   Sexual activity: Not Currently    Partners: Male    Birth control/protection: Condom    Comment: pt. declined condoms   Family History  Problem Relation Age of Onset   Pancreatic cancer Mother    Cancer Mother    Diabetes Paternal  Grandmother    Thyroid disease Paternal Grandmother    Immunization History  Administered Date(s) Administered   Hepatitis A 10/22/2012   Hepatitis A, Adult 04/23/2013   Hepatitis B 10/22/2012, 11/21/2012   Hepatitis B, ADULT 04/23/2013, 02/05/2014, 05/26/2014   Influenza Split 03/31/2011, 04/12/2012   Influenza Whole 04/07/2010   Influenza, Seasonal, Injecte, Preservative Fre 04/10/2023   Influenza,inj,Quad PF,6+ Mos 04/09/2013, 05/26/2014, 03/25/2015, 04/05/2016, 05/10/2017, 03/23/2018, 04/12/2019, 04/06/2021   Influenza-Unspecified 05/08/2020, 03/30/2022   Meningococcal Mcv4o 05/24/2016, 07/19/2017   PFIZER(Purple Top)SARS-COV-2 Vaccination 10/11/2019, 11/01/2019, 06/29/2020, 03/02/2021   Pfizer Covid-19 Vaccine Bivalent Booster 57yrs & up 08/19/2021   Pfizer(Comirnaty)Fall  Seasonal Vaccine 12 years and older 04/16/2022, 05/20/2023   Pneumococcal Conjugate-13 04/25/2018   Pneumococcal Polysaccharide-23 12/29/2009, 03/25/2015, 10/20/2020   Tdap 12/26/2013, 08/17/2023     Review of Systems: Negative except as noted in the HPI.   Objective: There were no vitals filed for this visit.  Verlie Hellenbrand is a pleasant 42 y.o. male in NAD. AAO X 3.    Lab Results  Component Value Date   HGBA1C 6.8 (A) 08/17/2023   ADA Risk Categorization: Low Risk :  Patient has all of the following: Intact protective sensation No prior foot ulcer  No severe deformity Pedal pulses present  Assessment: 1. Pain due to onychomycosis of toenail   2. Callus   3. Lymphedema   4. Pes planus of both feet   5. Type 2 diabetes mellitus with stage 2 chronic kidney disease, without long-term current use of insulin (HCC)   6. Encounter for diabetic foot exam (HCC)     Plan: Diabetic foot examination performed today. All patient's and/or POA's questions/concerns addressed on today's visit. Toenails 1-5 debrided in length and girth without incident. Callus(es) submet head 5 b/l pared with sharp  debridement without incident. Continue daily foot inspections and monitor blood glucose per PCP/Endocrinologist's recommendations.Continue soft, supportive shoe gear daily. Report any pedal injuries to medical professional. Call office if there are any questions/concerns. -Will submit preauthorization to insurance company for diabetic shoes with total contact insoles.. -Patient/POA to call should there be question/concern in the interim. Return in about 3 months (around 01/02/2024).  Freddie Breech, DPM      [place] LOCATION: [address], [place] [address]                   Office [phone]   [facility] LOCATION: [address] [place], [place] [address] Office [phone]

## 2023-10-12 MED ORDER — XOLAIR 300 MG/2ML ~~LOC~~ SOSY: 300.0000 mg | PREFILLED_SYRINGE | SUBCUTANEOUS | 11 refills | Status: AC

## 2023-10-12 NOTE — Addendum Note (Signed)
 Addended by: Devoria Glassing on: 10/12/2023 04:54 PM   Modules accepted: Orders

## 2023-10-16 ENCOUNTER — Encounter: Payer: Self-pay | Admitting: Oncology

## 2023-10-19 ENCOUNTER — Encounter: Payer: Self-pay | Admitting: Oncology

## 2023-10-19 ENCOUNTER — Other Ambulatory Visit: Payer: Self-pay | Admitting: *Deleted

## 2023-10-19 DIAGNOSIS — I890 Lymphedema, not elsewhere classified: Secondary | ICD-10-CM

## 2023-10-21 DIAGNOSIS — Z419 Encounter for procedure for purposes other than remedying health state, unspecified: Secondary | ICD-10-CM | POA: Diagnosis not present

## 2023-10-23 ENCOUNTER — Ambulatory Visit (HOSPITAL_COMMUNITY)
Admission: RE | Admit: 2023-10-23 | Discharge: 2023-10-23 | Disposition: A | Discharge status: Home / Self Care | Payer: 59 | Source: Ambulatory Visit | Attending: Infectious Diseases | Admitting: Infectious Diseases

## 2023-10-23 DIAGNOSIS — Z21 Asymptomatic human immunodeficiency virus [HIV] infection status: Secondary | ICD-10-CM | POA: Diagnosis present

## 2023-10-23 DIAGNOSIS — B20 Human immunodeficiency virus [HIV] disease: Secondary | ICD-10-CM | POA: Insufficient documentation

## 2023-10-23 MED ORDER — CABOTEGRAVIR & RILPIVIRINE ER 600 & 900 MG/3ML IM SUER
1.0000 | INTRAMUSCULAR | Status: DC
  Administered 2023-10-23: 1 via INTRAMUSCULAR
  Filled 2023-10-23: qty 6

## 2023-10-25 DIAGNOSIS — E785 Hyperlipidemia, unspecified: Secondary | ICD-10-CM

## 2023-10-25 MED ORDER — ROSUVASTATIN CALCIUM 5 MG PO TABS: 5.0000 mg | ORAL_TABLET | Freq: Every day | ORAL | 2 refills | Status: DC

## 2023-10-26 ENCOUNTER — Other Ambulatory Visit: Payer: Self-pay

## 2023-10-26 ENCOUNTER — Ambulatory Visit

## 2023-10-26 DIAGNOSIS — I10 Essential (primary) hypertension: Secondary | ICD-10-CM

## 2023-10-26 DIAGNOSIS — E1165 Type 2 diabetes mellitus with hyperglycemia: Secondary | ICD-10-CM

## 2023-10-26 MED ORDER — SPIRONOLACTONE 25 MG PO TABS: 25.0000 mg | ORAL_TABLET | Freq: Every day | ORAL | 2 refills | Status: DC

## 2023-10-26 MED ORDER — GLIPIZIDE ER 10 MG PO TB24: 10.0000 mg | ORAL_TABLET | Freq: Every day | ORAL | 0 refills | Status: DC

## 2023-10-26 MED ORDER — AMLODIPINE BESYLATE 10 MG PO TABS: 10.0000 mg | ORAL_TABLET | Freq: Every day | ORAL | 2 refills | Status: DC

## 2023-10-26 NOTE — Progress Notes (Signed)
 Patient ID: Steven [name], male   DOB: [birth year], 43 y.o.   MRN: [id number]  Reason for Consult: New Patient (Initial Visit)   Referred by Sandie Cross, MD  Subjective:     HPI  Steven [name] is a 43 y.o. male who presents for evaluation of lower extremity swelling.  He notes that he has had lymphedema for over 10 years and has had times where his swelling has been more well-controlled although due to insurance issues and job changes he has not been in the lymphedema clinic or getting lymphedema therapy for some time.  He does have compression pumps at home but has not used them recently.  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   Qualifier: Diagnosis of   By: Landa Pine MD, Nneka         Syphilis 03/23/2009   1:2   Thrush, oral 03/23/2015   Family History  Problem Relation Age of Onset   Pancreatic cancer Mother    Cancer Mother    Diabetes Paternal Grandmother    Thyroid  disease Paternal Grandmother    Past Surgical History:  Procedure Laterality Date   BRAIN SURGERY     IR GENERIC HISTORICAL  02/12/2016   IR REMOVAL TUN ACCESS W/ PORT W/O FL MOD SED 02/12/2016 Elene Griffes, MD [facility]    Short Social History:  Social History   Tobacco Use   Smoking status: Never   Smokeless tobacco: Never  Substance Use Topics   Alcohol use: Yes    Alcohol/week: 0.0 standard drinks of alcohol    Comment: socially - less now    Allergies  Allergen Reactions   Lisinopril Swelling    Swelling of lower lip while on lisinopril   Penicillins Hives and Swelling    REACTION: rash and swelling   Levaquin [Levofloxacin In D5w] Rash   Tessalon [Benzonatate] Rash   Mushroom Swelling   Shellfish Allergy Swelling   Aspirin Nausea Only    Current Outpatient  Medications  Medication Sig Dispense Refill   abacavir -dolutegravir -lamiVUDine (TRIUMEQ ) 600-50-300 MG tablet Take 1 tablet by mouth daily. 90 tablet 3   albuterol  (VENTOLIN  HFA) 108 (90 Base) MCG/ACT inhaler INHALE 2 PUFFS BY MOUTH CHEST EVERY 4 TO 6 HOURS AS NEEDED FOR COUGH OR WHEEZING OR TIGHTNESS OR SHORTNESS OF BREATH 8.5 g 2   amLODipine  (NORVASC ) 10 MG tablet Take 1 tablet (10 mg total) by mouth daily. for blood pressure. 90 tablet 2   Blood Glucose Monitoring Suppl DEVI 1 each by Does not apply route in the morning, at noon, and at bedtime. May substitute to any manufacturer covered by patient's insurance. 1 each 0   ciclopirox  (PENLAC ) 8 % solution APPLY 1 COAT TO TOENAIL EVERY DAY FOR 48 WEEKS. REMOVE WEEKLY WITH POLISH REMOVER 6.6 mL 11   cromolyn  (OPTICROM ) 4 % ophthalmic solution 2 drops in each eye 1 - 4 times daily as needed for red, itchy eyes. 10 mL 12   EPINEPHrine  0.3 mg/0.3 mL IJ SOAJ injection INJECT 0.3 MG IN THE MUSCLE AS NEEDED FOR ANAPHYLAXIS 2 each 1   esomeprazole  (NEXIUM ) 40 MG capsule Take 1 capsule (40 mg total) by mouth daily. 30 capsule 5   famotidine  (PEPCID ) 40 MG tablet  Take 1 tablet (40 mg total) by mouth daily. 30 tablet 0   fexofenadine  (ALLEGRA ) 180 MG tablet Take 1 tablet (180 mg total) by mouth daily. 15 tablet 0   fluticasone  (FLONASE ) 50 MCG/ACT nasal spray SHAKE WELL USE 1 OR 2 SPRAYS IN EACH NOSTRIL EVERYDAY FOR NASAL CONGESTION 16 mL 5   fluticasone -salmeterol (ADVAIR  HFA) 230-21 MCG/ACT inhaler INHALE 2 PUFFS BY MOUTH TWICE DAILY 12 g 5   glipiZIDE  (GLUCOTROL  XL) 10 MG 24 hr tablet Take 1 tablet (10 mg total) by mouth daily with breakfast. for diabetes. 90 tablet 0   Glucose Blood (BLOOD GLUCOSE TEST STRIPS) STRP 1 each by In Vitro route in the morning, at noon, and at bedtime. May substitute to any manufacturer covered by patient's insurance. 300 strip 0   ibuprofen  (ADVIL ) 800 MG tablet Take 800 mg by mouth 3 (three) times daily as needed.      LANCETS MICRO THIN 33G MISC USE TO TEST BLOOD SUGAR ONCE A DAY 100 each 2   Lancets Misc. MISC 1 each by Does not apply route in the morning, at noon, and at bedtime. May substitute to any manufacturer covered by patient's insurance. 300 each 0   levothyroxine  (SYNTHROID ) 150 MCG tablet Take 1 tablet (150 mcg total) by mouth daily before breakfast. And additional 1/2 tab once a week. 92 tablet 4   metFORMIN  (GLUCOPHAGE -XR) 500 MG 24 hr tablet Take 1 tablet (500 mg total) by mouth 2 (two) times daily with a meal. for diabetes. 180 tablet 0   montelukast  (SINGULAIR ) 10 MG tablet TAKE 1 TABLET(10 MG) BY MOUTH DAILY 90 tablet 1   Multiple Vitamin (MULTIVITAMIN) tablet Take 1 tablet by mouth daily.     omalizumab  (XOLAIR ) 300 MG/2  ML prefilled syringe Inject 300 mg into the skin every 14 (fourteen) days. 4 mL 11   rosuvastatin  (CRESTOR ) 5 MG tablet Take 1 tablet (5 mg total) by mouth daily. for cholesterol. 90 tablet 2   Semaglutide , 1 MG/DOSE, 4 MG/3ML SOPN Inject 1 mg as directed once a week. for diabetes. 9 mL 0   spironolactone  (ALDACTONE ) 25 MG tablet Take 1 tablet (25 mg total) by mouth daily. for blood pressure. 90 tablet 2   Current Facility-Administered Medications  Medication Dose Route Frequency Provider Last Rate Last Admin   omalizumab  (XOLAIR ) injection 300 mg  300 mg Subcutaneous Q14 Days Trudy Fusi, DO   300 mg at 10/12/22 1236    REVIEW OF SYSTEMS   All other systems were reviewed and are negative     Objective:  Objective   Vitals:   10/27/23 1433  BP: (!) 133/31  Pulse: 79  Resp: 18  Temp: 98.4 F (36.9 C)  TempSrc: Temporal  SpO2: 95%  Weight: 298 lb 8 oz (135.4 kg)  Height: 6' (1.829 m)   Body mass index is 40.48 kg/m.  Physical Exam General: no acute distress Cardiac: hemodynamically stable Pulm: normal work of breathing Neuro: alert, no focal deficit Extremities: Significant nonpitting edema from ankle to knee.  No varicosities.  Dry woody skin changes  on the anterior aspect of the right lower leg.  No open sores. Vascular:   Right: palpable DP  Left: palpable DP   Data: Reflux study +--------------+---------+------+-----------+------------+--------+  RIGHT        Reflux NoRefluxReflux TimeDiameter cmsComments                          Yes                                   +--------------+---------+------+-----------+------------+--------+  CFV          no                                              +--------------+---------+------+-----------+------------+--------+  FV mid        no                                              +--------------+---------+------+-----------+------------+--------+  Popliteal    no                                              +--------------+---------+------+-----------+------------+--------+  GSV at [facility]    no                            0.45              +--------------+---------+------+-----------+------------+--------+  GSV prox thighno                            0.43              +--------------+---------+------+-----------+------------+--------+  GSV mid thigh no                            0.23              +--------------+---------+------+-----------+------------+--------+  GSV dist thighno                            0.33              +--------------+---------+------+-----------+------------+--------+  GSV at knee   no                            0.35              +--------------+---------+------+-----------+------------+--------+  SSV Pop Fossa no                            0.30              +--------------+---------+------+-----------+------------+--------+       Assessment/Plan:     Steven [name] is a 43 y.o. male with lymphedema.  I explained that he does not have chronic venous insufficiency based on the reflux study today.  He does have compression pumps at home that he has not used in some time and has not been  in compression stockings.  I explained that it would not be very useful to get fitted today as he needs to get back into the lymphedema clinic to get him out of this acute swollen phase into a more stable maintenance phase which is when the compression stockings should be fit.  Encouraged to use his lymphedema pumps while he waits for his appointment with the clinic.  Referral to lymphedema clinic placed      Philipp Brawn MD [facility]

## 2023-10-27 ENCOUNTER — Ambulatory Visit: Payer: 59 | Admitting: Vascular Surgery

## 2023-10-27 ENCOUNTER — Encounter: Payer: Self-pay | Admitting: Vascular Surgery

## 2023-10-27 ENCOUNTER — Ambulatory Visit (HOSPITAL_COMMUNITY)
Admission: RE | Admit: 2023-10-27 | Discharge: 2023-10-27 | Disposition: A | Discharge status: Home / Self Care | Payer: 59 | Source: Ambulatory Visit | Attending: Vascular Surgery | Admitting: Vascular Surgery

## 2023-10-27 VITALS — BP 133/31 | HR 79 | Temp 98.4°F | Resp 18 | Ht 72.0 in | Wt 298.5 lb

## 2023-10-27 DIAGNOSIS — I890 Lymphedema, not elsewhere classified: Secondary | ICD-10-CM | POA: Insufficient documentation

## 2023-11-15 ENCOUNTER — Encounter: Payer: Self-pay | Admitting: Primary Care

## 2023-11-15 ENCOUNTER — Ambulatory Visit: Payer: 59 | Admitting: Primary Care

## 2023-11-15 VITALS — BP 132/82 | HR 96 | Temp 97.1°F | Ht 72.0 in | Wt 301.0 lb

## 2023-11-15 DIAGNOSIS — Z7984 Long term (current) use of oral hypoglycemic drugs: Secondary | ICD-10-CM | POA: Diagnosis not present

## 2023-11-15 DIAGNOSIS — E1165 Type 2 diabetes mellitus with hyperglycemia: Secondary | ICD-10-CM

## 2023-11-15 DIAGNOSIS — Z7985 Long-term (current) use of injectable non-insulin antidiabetic drugs: Secondary | ICD-10-CM

## 2023-11-15 LAB — POCT GLYCOSYLATED HEMOGLOBIN (HGB A1C): Hemoglobin A1C: 7.4 % — AB (ref 4.0–5.6)

## 2023-11-15 NOTE — Assessment & Plan Note (Signed)
 Deteriorated with A1c of 7.4 today.  We have a long discussion today regarding his diet. We discussed options for his A1c increase which included increasing Ozempic  to 2 mg weekly versus continue work on lifestyle changes.  He opts to continue to work on lifestyle changes which is reasonable.  He will also increase physical activity.  Continue metformin  ER 5 mg twice daily, glipizide  XL 10 mg daily, Ozempic  1 mg weekly. Follow-up in 3 months.

## 2023-11-15 NOTE — Progress Notes (Signed)
 Subjective:    Patient ID: Steven [name], male    DOB: 1980-11-17, 43 y.o.   MRN: [id number]  HPI  Steven [name] is a very pleasant 43 y.o. male with a history of hypertension, type 2 diabetes, hypothyroidism, CKD, HIV, hyperlipidemia who presents today for follow-up of diabetes.  Current medications include: Glipizide  XL 10 mg daily, metformin  ER 500 mg twice daily, Ozempic  1 mg weekly.  He is checking his blood glucose 2 times daily and is getting readings of:  AM fasting: low 100s 2 hours after dinner: low 200s  Last A1C: 6.8 in February 2025, 7.4 today  Last Eye Exam: Up-to-date Last Foot Exam: Up-to-date Pneumonia Vaccination: 2022 Urine Microalbumin: Up-to-date Statin: Rosuvastatin   Dietary changes since last visit: He has cut out late snacking. He is weaning off chips. He is cutting out fatty meats, increasing veggies and fruit.    Exercise: Some days walks.    BP Readings from Last 3 Encounters:  11/15/23 132/82  10/27/23 (!) 133/31  10/23/23 (!) 149/99    Wt Readings from Last 3 Encounters:  11/15/23 (!) 301 lb (136.5 kg)  10/27/23 298 lb 8 oz (135.4 kg)  10/23/23 290 lb (131.5 kg)     Review of Systems  Respiratory:  Negative for shortness of breath.   Cardiovascular:  Negative for chest pain.  Neurological:  Positive for numbness.         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   Qualifier: Diagnosis of   By: Landa Pine MD, Nneka         Syphilis 03/23/2009   1:2   Thrush, oral 03/23/2015    Social History   Socioeconomic History   Marital status: Single    Spouse name: Not on file   Number of children: Not on file   Years of education: Not on file   Highest education level: Not on file  Occupational History   Not on  file  Tobacco Use   Smoking status: Never   Smokeless tobacco: Never  Vaping Use   Vaping status: Never Used  Substance and Sexual Activity   Alcohol use: Yes    Alcohol/week: 0.0 standard drinks of alcohol    Comment: socially - less now   Drug use: No   Sexual activity: Not Currently    Partners: Male    Birth control/protection: Condom    Comment: pt. declined condoms  Other Topics Concern   Not on file  Social History Narrative   Single.    No children.   Works in Clinical biochemist   Enjoys DJ, traveling.    Social Drivers of Corporate investment banker Strain: Low Risk  (01/02/2023)   Overall Financial Resource Strain (CARDIA)    Difficulty of Paying Living Expenses: Not very hard  Food Insecurity: Not on file  Transportation Needs: No Transportation Needs (01/02/2023)   PRAPARE - Administrator, Civil Service (Medical): No    Lack of Transportation (Non-Medical): No  Physical Activity: Insufficiently Active (01/02/2023)   Exercise Vital Sign    Days of Exercise per Week: 2 days    Minutes of Exercise per Session: 20 min  Stress: Patient Declined (01/02/2023)   [employer] of Occupational  Health - Occupational Stress Questionnaire    Feeling of Stress : Patient declined  Social Connections: Unknown (01/02/2023)   Social Connection and Isolation Panel [NHANES]    Frequency of Communication with Friends and Family: Not on file    Frequency of Social Gatherings with Friends and Family: Not on file    Attends Religious Services: Not on file    Active Member of Clubs or Organizations: No    Attends Banker Meetings: Not on file    Marital Status: Not on file  Intimate Partner Violence: Unknown (10/15/2021)   Received from [employer], [facility]   HITS    Physically Hurt: Not on file    Insult or Talk Down To: Not on file    Threaten Physical Harm: Not on file    Scream or Curse: Not on file    Past Surgical History:  Procedure  Laterality Date   BRAIN SURGERY     IR GENERIC HISTORICAL  02/12/2016   IR REMOVAL TUN ACCESS W/ PORT W/O FL MOD SED 02/12/2016 Elene Griffes, MD [facility]    Family History  Problem Relation Age of Onset   Pancreatic cancer Mother    Cancer Mother    Diabetes Paternal Grandmother    Thyroid  disease Paternal Grandmother     Allergies  Allergen Reactions   Lisinopril Swelling    Swelling of lower lip while on lisinopril   Penicillins Hives and Swelling    REACTION: rash and swelling   Levaquin [Levofloxacin In D5w] Rash   Tessalon [Benzonatate] Rash   Mushroom Swelling   Shellfish Allergy Swelling   Aspirin Nausea Only    Current Outpatient Medications on File Prior to Visit  Medication Sig Dispense Refill   albuterol  (VENTOLIN  HFA) 108 (90 Base) MCG/ACT inhaler INHALE 2 PUFFS BY MOUTH CHEST EVERY 4 TO 6 HOURS AS NEEDED FOR COUGH OR WHEEZING OR TIGHTNESS OR SHORTNESS OF BREATH 8.5 g 2   amLODipine  (NORVASC ) 10 MG tablet Take 1 tablet (10 mg total) by mouth daily. for blood pressure. 90 tablet 2   Blood Glucose Monitoring Suppl DEVI 1 each by Does not apply route in the morning, at noon, and at bedtime. May substitute to any manufacturer covered by patient's insurance. 1 each 0   ciclopirox  (PENLAC ) 8 % solution APPLY 1 COAT TO TOENAIL EVERY DAY FOR 48 WEEKS. REMOVE WEEKLY WITH POLISH REMOVER 6.6 mL 11   cromolyn  (OPTICROM ) 4 % ophthalmic solution 2 drops in each eye 1 - 4 times daily as needed for red, itchy eyes. 10 mL 12   EPINEPHrine  0.3 mg/0.3 mL IJ SOAJ injection INJECT 0.3 MG IN THE MUSCLE AS NEEDED FOR ANAPHYLAXIS 2 each 1   esomeprazole  (NEXIUM ) 40 MG capsule Take 1 capsule (40 mg total) by mouth daily. 30 capsule 5   fexofenadine  (ALLEGRA ) 180 MG tablet Take 1 tablet (180 mg total) by mouth daily. 15 tablet 0   fluticasone  (FLONASE ) 50 MCG/ACT nasal spray SHAKE WELL USE 1 OR 2 SPRAYS IN EACH NOSTRIL EVERYDAY FOR NASAL CONGESTION 16 mL 5   fluticasone -salmeterol (ADVAIR  HFA)  230-21 MCG/ACT inhaler INHALE 2 PUFFS BY MOUTH TWICE DAILY 12 g 5   glipiZIDE  (GLUCOTROL  XL) 10 MG 24 hr tablet Take 1 tablet (10 mg total) by mouth daily with breakfast. for diabetes. 90 tablet 0   Glucose Blood (BLOOD GLUCOSE TEST STRIPS) STRP 1 each by In Vitro route in the morning, at noon, and at bedtime. May substitute to any  manufacturer covered by [employer]. 300 strip 0   ibuprofen  (ADVIL ) 800 MG tablet Take 800 mg by mouth 3 (three) times daily as needed.     LANCETS MICRO THIN 33G MISC USE TO TEST BLOOD SUGAR ONCE A DAY 100 each 2   Lancets Misc. MISC 1 each by Does not apply route in the morning, at noon, and at bedtime. May substitute to any manufacturer covered by patient's insurance. 300 each 0   levothyroxine  (SYNTHROID ) 150 MCG tablet Take 1 tablet (150 mcg total) by mouth daily before breakfast. And additional 1/2 tab once a week. 92 tablet 4   metFORMIN  (GLUCOPHAGE -XR) 500 MG 24 hr tablet Take 1 tablet (500 mg total) by mouth 2 (two) times daily with a meal. for diabetes. 180 tablet 0   montelukast  (SINGULAIR ) 10 MG tablet TAKE 1 TABLET(10 MG) BY MOUTH DAILY 90 tablet 1   Multiple Vitamin (MULTIVITAMIN) tablet Take 1 tablet by mouth daily.     omalizumab  (XOLAIR ) 300 MG/2  ML prefilled syringe Inject 300 mg into the skin every 14 (fourteen) days. 4 mL 11   rosuvastatin  (CRESTOR ) 5 MG tablet Take 1 tablet (5 mg total) by mouth daily. for cholesterol. 90 tablet 2   Semaglutide , 1 MG/DOSE, 4 MG/3ML SOPN Inject 1 mg as directed once a week. for diabetes. 9 mL 0   spironolactone  (ALDACTONE ) 25 MG tablet Take 1 tablet (25 mg total) by mouth daily. for blood pressure. 90 tablet 2   abacavir -dolutegravir -lamiVUDine (TRIUMEQ ) 600-50-300 MG tablet Take 1 tablet by mouth daily. (Patient not taking: Reported on 11/15/2023) 90 tablet 3   famotidine  (PEPCID ) 40 MG tablet Take 1 tablet (40 mg total) by mouth daily. (Patient not taking: Reported on 11/15/2023) 30 tablet 0   Current  Facility-Administered Medications on File Prior to Visit  Medication Dose Route Frequency Provider Last Rate Last Admin   omalizumab  (XOLAIR ) injection 300 mg  300 mg Subcutaneous Q14 Days Eudelia Hero M, DO   300 mg at 10/12/22 1236    BP 132/82   Pulse 96   Temp (!) 97.1 F (36.2 C) (Temporal)   Ht 6' (1.829 m)   Wt (!) 301 lb (136.5 kg)   SpO2 97%   BMI 40.82 kg/m  Objective:   Physical Exam Cardiovascular:     Rate and Rhythm: Normal rate and regular rhythm.  Pulmonary:     Effort: Pulmonary effort is normal.     Breath sounds: Normal breath sounds.  Musculoskeletal:     Cervical back: Neck supple.  Skin:    General: Skin is warm and dry.  Neurological:     Mental Status: He is alert and oriented to person, place, and time.  Psychiatric:        Mood and Affect: Mood normal.           Assessment & Plan:  Type 2 diabetes mellitus with hyperglycemia, without long-term current use of insulin (HCC) Assessment & Plan: Deteriorated with A1c of 7.4 today.  We have a long discussion today regarding his diet. We discussed options for his A1c increase which included increasing Ozempic  to 2 mg weekly versus continue work on lifestyle changes.  He opts to continue to work on lifestyle changes which is reasonable.  He will also increase physical activity.  Continue metformin  ER 5 mg twice daily, glipizide  XL 10 mg daily, Ozempic  1 mg weekly. Follow-up in 3 months.  Orders: -     POCT glycosylated hemoglobin (Hb A1C)  Leanza Shepperson K Joziyah Roblero, NP

## 2023-11-15 NOTE — Patient Instructions (Signed)
 Continue to work on [employer].  Continue exercising. You should be getting 150 minutes of moderate intensity exercise weekly.  Please schedule a follow up visit for 3 months.  It was a pleasure to see you today!

## 2023-11-20 DIAGNOSIS — Z419 Encounter for procedure for purposes other than remedying health state, unspecified: Secondary | ICD-10-CM | POA: Diagnosis not present

## 2023-11-26 ENCOUNTER — Encounter: Payer: Self-pay | Admitting: Podiatry

## 2023-11-28 ENCOUNTER — Other Ambulatory Visit (INDEPENDENT_AMBULATORY_CARE_PROVIDER_SITE_OTHER): Admitting: Podiatry

## 2023-11-28 ENCOUNTER — Encounter: Payer: Self-pay | Admitting: Podiatry

## 2023-11-28 DIAGNOSIS — L84 Corns and callosities: Secondary | ICD-10-CM

## 2023-11-28 DIAGNOSIS — M2141 Flat foot [pes planus] (acquired), right foot: Secondary | ICD-10-CM

## 2023-11-28 DIAGNOSIS — N182 Chronic kidney disease, stage 2 (mild): Secondary | ICD-10-CM

## 2023-11-28 DIAGNOSIS — M2142 Flat foot [pes planus] (acquired), left foot: Secondary | ICD-10-CM

## 2023-11-28 DIAGNOSIS — E1122 Type 2 diabetes mellitus with diabetic chronic kidney disease: Secondary | ICD-10-CM

## 2023-11-28 NOTE — Progress Notes (Signed)
 1. Type 2 diabetes mellitus with stage 2 chronic kidney disease, without long-term current use of insulin (HCC)   2. Pes planus of both feet   3. Pre-ulcerative calluses    Orders Placed This Encounter  Procedures   For Home Use Only DME Diabetic Shoe    To Arts administrator and Prosthetics: Dispense one pair extra depth shoes and 3 pair custom insoles. Offload calluses submet head 5 bilaterally.

## 2023-11-30 ENCOUNTER — Ambulatory Visit (INDEPENDENT_AMBULATORY_CARE_PROVIDER_SITE_OTHER)

## 2023-11-30 DIAGNOSIS — J4540 Moderate persistent asthma, uncomplicated: Secondary | ICD-10-CM | POA: Diagnosis not present

## 2023-11-30 MED ORDER — OMALIZUMAB 300 MG/2  ML ~~LOC~~ SOSY
300.0000 mg | PREFILLED_SYRINGE | SUBCUTANEOUS | Status: AC
  Administered 2023-11-30 – 2024-05-24 (×10): 300 mg via SUBCUTANEOUS

## 2023-11-30 NOTE — Progress Notes (Signed)
 Immunotherapy   Patient Details  Name: Steven [name] MRN: [id number] Date of Birth: Jul 06, 1981  11/30/2023  Steven [name] re-started injections for Asthma. Patient received 300 mg of Xolair  and waited 1 hour with no problems.  Frequency:every 14 days Epi-Pen:Epi-Pen Available  Consent signed and patient instructions given.   Denton Flakes 11/30/2023, 8:27 AM

## 2023-12-01 ENCOUNTER — Other Ambulatory Visit: Payer: Self-pay

## 2023-12-01 DIAGNOSIS — E1165 Type 2 diabetes mellitus with hyperglycemia: Secondary | ICD-10-CM

## 2023-12-01 MED ORDER — SEMAGLUTIDE (1 MG/DOSE) 4 MG/3ML ~~LOC~~ SOPN
1.0000 mg | PEN_INJECTOR | SUBCUTANEOUS | 0 refills | Status: DC
Start: 2023-12-01 — End: 2024-03-20

## 2023-12-15 ENCOUNTER — Ambulatory Visit (INDEPENDENT_AMBULATORY_CARE_PROVIDER_SITE_OTHER): Admitting: *Deleted

## 2023-12-15 DIAGNOSIS — J4540 Moderate persistent asthma, uncomplicated: Secondary | ICD-10-CM

## 2023-12-18 ENCOUNTER — Inpatient Hospital Stay (HOSPITAL_COMMUNITY): Admission: RE | Admit: 2023-12-18 | Source: Ambulatory Visit

## 2023-12-21 DIAGNOSIS — Z419 Encounter for procedure for purposes other than remedying health state, unspecified: Secondary | ICD-10-CM | POA: Diagnosis not present

## 2023-12-28 ENCOUNTER — Inpatient Hospital Stay (HOSPITAL_COMMUNITY): Admission: RE | Admit: 2023-12-28 | Source: Ambulatory Visit

## 2023-12-28 ENCOUNTER — Other Ambulatory Visit: Payer: Self-pay

## 2023-12-28 ENCOUNTER — Ambulatory Visit

## 2023-12-28 DIAGNOSIS — J4540 Moderate persistent asthma, uncomplicated: Secondary | ICD-10-CM | POA: Diagnosis not present

## 2023-12-28 DIAGNOSIS — E119 Type 2 diabetes mellitus without complications: Secondary | ICD-10-CM

## 2023-12-28 MED ORDER — METFORMIN HCL ER 500 MG PO TB24: 500.0000 mg | ORAL_TABLET | Freq: Two times a day (BID) | ORAL | 0 refills | Status: DC

## 2023-12-29 ENCOUNTER — Ambulatory Visit

## 2024-01-01 ENCOUNTER — Ambulatory Visit (HOSPITAL_COMMUNITY)
Admission: RE | Admit: 2024-01-01 | Discharge: 2024-01-01 | Disposition: A | Discharge status: Home / Self Care | Source: Ambulatory Visit | Attending: Infectious Diseases | Admitting: Infectious Diseases

## 2024-01-01 DIAGNOSIS — B20 Human immunodeficiency virus [HIV] disease: Secondary | ICD-10-CM | POA: Diagnosis present

## 2024-01-01 DIAGNOSIS — Z21 Asymptomatic human immunodeficiency virus [HIV] infection status: Secondary | ICD-10-CM | POA: Insufficient documentation

## 2024-01-01 MED ORDER — CABOTEGRAVIR & RILPIVIRINE ER 600 & 900 MG/3ML IM SUER
1.0000 | INTRAMUSCULAR | Status: DC
  Administered 2024-01-01: 1 via INTRAMUSCULAR
  Filled 2024-01-01: qty 6

## 2024-01-11 ENCOUNTER — Ambulatory Visit

## 2024-01-14 NOTE — Progress Notes (Unsigned)
   [address] [place] [address] Dept: [phone]  FOLLOW UP NOTE  Patient ID: Steven [name], male    DOB: 08-Jan-1981  Age: 43 y.o. MRN: [id number] Date of Office Visit: 01/15/2024  Assessment  Chief Complaint: No chief complaint on file.  HPI Steven [name] is a 43 year old male who presents to clinic for follow-up visit.  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  His problem list is current for HIV infection.   His last environmental allergy testing was greater than 5 years ago and was positive to grass pollen, weed pollen, mold, dust mite, cat, and dog.   Discussed the use of AI scribe software for clinical note transcription with the patient, who gave verbal consent to proceed.  History of Present Illness      Drug Allergies:  Allergies  Allergen Reactions   Lisinopril Swelling    Swelling of lower lip while on lisinopril   Penicillins Hives and Swelling    REACTION: rash and swelling   Levaquin [Levofloxacin In D5w] Rash   Tessalon [Benzonatate] Rash   Mushroom Swelling   Shellfish Allergy Swelling   Aspirin Nausea Only    Physical Exam: There were no vitals taken for this visit.   Physical Exam  Diagnostics:    Assessment and Plan: No diagnosis found.  No orders of the defined types were placed in this encounter.   There are no Patient Instructions on file for this visit.  No follow-ups on file.    Thank you for the opportunity to care for this patient.  Please do not hesitate to contact me with questions.  Arlean Mutter, FNP [facility]

## 2024-01-14 NOTE — Patient Instructions (Incomplete)
 Asthma Continue montelukast  10 mg once a day to prevent cough or wheeze Continue Advair  230-2 puffs twice a day with a spacer to prevent cough or wheeze Continue albuterol  2 puffs once every 4 hours as needed for cough or wheeze You may use albuterol  2 puffs 5 to 15 minutes before activity to decrease cough or wheeze Will resubmit for Xolair  injections 300 mg once every 2 weeks to help control your asthma   Allergic rhinitis Continue allergen avoidance measures directed toward grass pollen, weed pollen, mold, dust mite, cat, and dog as listed below Continue an antihistamine once a day as needed for runny nose or itch. Remember to rotate to a different antihistamine about every 3 months. Some examples of over the counter antihistamines include Zyrtec (cetirizine), Xyzal (levocetirizine), Allegra  (fexofenadine ), and Claritin (loratidine).  Continue Flonase  2 sprays in each nostril once a day as needed for stuffy nose.  In the right nostril, point the applicator out toward the right ear. In the left nostril, point the applicator out toward the left ear Consider saline nasal rinses as needed for nasal symptoms. Use this before any medicated nasal sprays for best result   Allergic conjunctivitis Continue cromolyn  eye drops two drops in each eye up to 4 times a day as needed for red or itchy eyes   Reflux Continue dietary and lifestyle modifications as listed below Continue Nexium  40 mg once a day as needed for reflux   Food allergy Continue to avoid shellfish and mushroom. In case of an allergic reaction, take Benadryl  50 mg every 4 hours, and if life-threatening symptoms occur, inject with EpiPen  0.3 mg.  Idiopathic allergic reaction If your symptoms re-occur, begin a journal of events that occurred for up to 6 hours before your symptoms began including foods and beverages consumed, soaps or perfumes you had contact with, and medications.   Your blood pressure was elevated while in the office  today.  Please make an appointment with your primary care physician to discuss  Call the clinic if this treatment plan is not working well for you.   Follow up in 2 months or sooner if needed.   Reducing Pollen Exposure The American Academy of Allergy, Asthma and Immunology suggests the following steps to reduce your exposure to pollen during allergy seasons. Do not hang sheets or clothing out to dry; pollen may collect on these items. Do not mow lawns or spend time around freshly cut grass; mowing stirs up pollen. Keep windows closed at night.  Keep car windows closed while driving. Minimize morning activities outdoors, a time when pollen counts are usually at their highest. Stay indoors as much as possible when pollen counts or humidity is high and on windy days when pollen tends to remain in the air longer. Use air conditioning when possible.  Many air conditioners have filters that trap the pollen spores. Use a HEPA room air filter to remove pollen form the indoor air you breathe.   Control of Dust Mite Allergen Dust mites play a major role in allergic asthma and rhinitis. They occur in environments with high humidity wherever human skin is found. Dust mites absorb humidity from the atmosphere (ie, they do not drink) and feed on organic matter (including shed human and animal skin). Dust mites are a microscopic type of insect that you cannot see with the naked eye. High levels of dust mites have been detected from mattresses, pillows, carpets, upholstered furniture, bed covers, clothes, soft toys and any woven material. The  principal allergen of the dust mite is found in its feces. A gram of dust may contain 1,000 mites and 250,000 fecal particles. Mite antigen is easily measured in the air during house cleaning activities. Dust mites do not bite and do not cause harm to humans, other than by triggering allergies/asthma.  Ways to decrease your exposure to dust mites in your home:  1. Encase  mattresses, box springs and pillows with a mite-impermeable barrier or cover  2. Wash sheets, blankets and drapes weekly in hot water (130 F) with detergent and dry them in a dryer on the hot setting.  3. Have the room cleaned frequently with a vacuum cleaner and a damp dust-mop. For carpeting or rugs, vacuuming with a vacuum cleaner equipped with a high-efficiency particulate air (HEPA) filter. The dust mite allergic individual should not be in a room which is being cleaned and should wait 1 hour after cleaning before going into the room.  4. Do not sleep on upholstered furniture (eg, couches).  5. If possible removing carpeting, upholstered furniture and drapery from the home is ideal. Horizontal blinds should be eliminated in the rooms where the person spends the most time (bedroom, study, television room). Washable vinyl, roller-type shades are optimal.  6. Remove all non-washable stuffed toys from the bedroom. Wash stuffed toys weekly like sheets and blankets above.  7. Reduce indoor humidity to less than 50%. Inexpensive humidity monitors can be purchased at most hardware stores. Do not use a humidifier as can make the problem worse and are not recommended.  Control of Dog or Cat Allergen Avoidance is the best way to manage a dog or cat allergy. If you have a dog or cat and are allergic to dog or cats, consider removing the dog or cat from the home. If you have a dog or cat but don't want to find it a new home, or if your family wants a pet even though someone in the household is allergic, here are some strategies that may help keep symptoms at bay:  Keep the pet out of your bedroom and restrict it to only a few rooms. Be advised that keeping the dog or cat in only one room will not limit the allergens to that room. Don't pet, hug or kiss the dog or cat; if you do, wash your hands with soap and water. High-efficiency particulate air (HEPA) cleaners run continuously in a bedroom or living  room can reduce allergen levels over time. Regular use of a high-efficiency vacuum cleaner or a central vacuum can reduce allergen levels. Giving your dog or cat a bath at least once a week can reduce airborne allergen.  Control of Mold Allergen Mold and fungi can grow on a variety of surfaces provided certain temperature and moisture conditions exist.  Outdoor molds grow on plants, decaying vegetation and soil.  The major outdoor mold, Alternaria and Cladosporium, are found in very high numbers during hot and dry conditions.  Generally, a late Summer - Fall peak is seen for common outdoor fungal spores.  Rain will temporarily lower outdoor mold spore count, but counts rise rapidly when the rainy period ends.  The most important indoor molds are Aspergillus and Penicillium.  Dark, humid and poorly ventilated basements are ideal sites for mold growth.  The next most common sites of mold growth are the bathroom and the kitchen.  Outdoor [employer] Use air conditioning and keep windows closed Avoid exposure to decaying vegetation. Avoid leaf raking. Avoid grain handling.  Consider wearing a face mask if working in moldy areas.  Indoor Mold Control Maintain humidity below 50%. Clean washable surfaces with 5% bleach solution. Remove sources e.g. Contaminated carpets.

## 2024-01-15 ENCOUNTER — Ambulatory Visit

## 2024-01-15 ENCOUNTER — Other Ambulatory Visit: Payer: Self-pay

## 2024-01-15 ENCOUNTER — Ambulatory Visit: Payer: 59 | Admitting: Family Medicine

## 2024-01-15 ENCOUNTER — Encounter: Payer: Self-pay | Admitting: Family Medicine

## 2024-01-15 VITALS — BP 122/100 | HR 94 | Temp 98.4°F | Resp 19

## 2024-01-15 DIAGNOSIS — J4540 Moderate persistent asthma, uncomplicated: Secondary | ICD-10-CM

## 2024-01-15 DIAGNOSIS — J302 Other seasonal allergic rhinitis: Secondary | ICD-10-CM | POA: Diagnosis not present

## 2024-01-15 DIAGNOSIS — H1013 Acute atopic conjunctivitis, bilateral: Secondary | ICD-10-CM | POA: Diagnosis not present

## 2024-01-15 DIAGNOSIS — K219 Gastro-esophageal reflux disease without esophagitis: Secondary | ICD-10-CM | POA: Diagnosis not present

## 2024-01-15 DIAGNOSIS — T7840XD Allergy, unspecified, subsequent encounter: Secondary | ICD-10-CM

## 2024-01-15 DIAGNOSIS — J4550 Severe persistent asthma, uncomplicated: Secondary | ICD-10-CM

## 2024-01-15 DIAGNOSIS — H1010 Acute atopic conjunctivitis, unspecified eye: Secondary | ICD-10-CM

## 2024-01-15 DIAGNOSIS — Z91018 Allergy to other foods: Secondary | ICD-10-CM

## 2024-01-15 DIAGNOSIS — J3089 Other allergic rhinitis: Secondary | ICD-10-CM

## 2024-01-15 MED ORDER — FLUTICASONE-SALMETEROL 230-21 MCG/ACT IN AERO: INHALATION_SPRAY | RESPIRATORY_TRACT | 5 refills | Status: AC

## 2024-01-15 MED ORDER — EPINEPHRINE 0.3 MG/0.3ML IJ SOAJ: INTRAMUSCULAR | 1 refills | Status: AC

## 2024-01-15 MED ORDER — CROMOLYN SODIUM 4 % OP SOLN: OPHTHALMIC | 12 refills | Status: AC

## 2024-01-15 MED ORDER — FLUTICASONE PROPIONATE 50 MCG/ACT NA SUSP: NASAL | 5 refills | Status: AC

## 2024-01-15 MED ORDER — ALBUTEROL SULFATE HFA 108 (90 BASE) MCG/ACT IN AERS: INHALATION_SPRAY | RESPIRATORY_TRACT | 2 refills | Status: AC

## 2024-01-20 DIAGNOSIS — Z419 Encounter for procedure for purposes other than remedying health state, unspecified: Secondary | ICD-10-CM | POA: Diagnosis not present

## 2024-01-24 ENCOUNTER — Other Ambulatory Visit: Payer: Self-pay

## 2024-01-24 DIAGNOSIS — E1165 Type 2 diabetes mellitus with hyperglycemia: Secondary | ICD-10-CM

## 2024-01-24 MED ORDER — GLIPIZIDE ER 10 MG PO TB24: 10.0000 mg | ORAL_TABLET | Freq: Every day | ORAL | 0 refills | Status: DC

## 2024-01-29 ENCOUNTER — Ambulatory Visit

## 2024-01-30 ENCOUNTER — Ambulatory Visit

## 2024-02-09 ENCOUNTER — Ambulatory Visit

## 2024-02-09 DIAGNOSIS — J4550 Severe persistent asthma, uncomplicated: Secondary | ICD-10-CM

## 2024-02-12 ENCOUNTER — Encounter: Payer: Self-pay | Admitting: Endocrinology

## 2024-02-12 ENCOUNTER — Ambulatory Visit (INDEPENDENT_AMBULATORY_CARE_PROVIDER_SITE_OTHER): Payer: Medicaid Other | Admitting: Endocrinology

## 2024-02-12 VITALS — BP 132/84 | HR 90 | Resp 20 | Ht 72.0 in | Wt 302.4 lb

## 2024-02-12 DIAGNOSIS — Z8639 Personal history of other endocrine, nutritional and metabolic disease: Secondary | ICD-10-CM

## 2024-02-12 DIAGNOSIS — E890 Postprocedural hypothyroidism: Secondary | ICD-10-CM | POA: Diagnosis not present

## 2024-02-12 LAB — T4, FREE: Free T4: 1.6 ng/dL (ref 0.8–1.8)

## 2024-02-12 LAB — TSH: TSH: 2.52 m[IU]/L (ref 0.40–4.50)

## 2024-02-12 NOTE — Progress Notes (Signed)
 Outpatient Endocrinology Note [place] Thelma Lorenzetti, MD  02/12/24  Patient's Name: Steven [name]    DOB: December 16, 1980    MRN: [id number]  REASON OF VISIT: Follow-up for hypothyroidism  PCP: Gretta Comer POUR, NP  HISTORY OF PRESENT ILLNESS:   Steven [name] is a 43 y.o. old male with past medical history as listed below is presented for a follow up of postablative hypothyroidism.   Pertinent Thyroid  History: Patient had Graves' disease status post reactive iodine ablation in November 2012 and developed hypothyroidism.  He has been mostly on levothyroxine  150 or 200 mcg daily in last few years.  He has type 2 diabetes mellitus currently on glipizide  and metformin , managed by primary care provider.  # Graves' eye disease, following with ophthalmology.  Interval history   Patient has been taking levothyroxine  150 mcg daily and additional half tablet once a week.  He did in spite of addition related to intolerance.  No change in bowel habit.  Body weight is relatively stable.  He complains of occasional redness and watering of the eyes.  Follow-up with ophthalmology and using eyedrops.    REVIEW OF SYSTEMS:  As per history of present illness.   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   Qualifier: Diagnosis of   By: Yong MD, Nneka         Syphilis 03/23/2009   1:2   Thrush, oral 03/23/2015    PAST SURGICAL HISTORY: Past Surgical History:  Procedure Laterality Date   BRAIN SURGERY     IR GENERIC HISTORICAL  02/12/2016   IR REMOVAL TUN ACCESS W/ PORT W/O FL MOD SED 02/12/2016 Juliene Balder, MD [facility]    ALLERGIES: Allergies  Allergen Reactions   Lisinopril Swelling    Swelling of lower lip while on lisinopril   Penicillins Hives and  Swelling    REACTION: rash and swelling   Levaquin [Levofloxacin In D5w] Rash   Tessalon [Benzonatate] Rash   Mushroom Swelling   Shellfish Allergy Swelling   Aspirin Nausea Only    FAMILY HISTORY:  Family History  Problem Relation Age of Onset   Pancreatic cancer Mother    Cancer Mother    Diabetes Paternal Grandmother    Thyroid  disease Paternal Grandmother     SOCIAL HISTORY: Social History   Socioeconomic History   Marital status: Single    Spouse name: Not on file   Number of children: Not on file   Years of education: Not on file   Highest education level: Not on file  Occupational History   Not on file  Tobacco Use   Smoking status: Never   Smokeless tobacco: Never  Vaping Use   Vaping status: Never Used  Substance and Sexual Activity   Alcohol use: Yes    Alcohol/week: 0.0 standard drinks of alcohol    Comment: socially - less now   Drug use: No   Sexual activity: Not Currently    Partners: Male    Birth control/protection: Condom    Comment: pt. declined condoms  Other Topics Concern   Not on file  Social History Narrative   Single.    No children.   Works in Clinical biochemist   Enjoys DJ, traveling.  Social Drivers of Corporate investment banker Strain: Low Risk  (01/02/2023)   Overall Financial Resource Strain (CARDIA)    Difficulty of Paying Living Expenses: Not very hard  Food Insecurity: Not on file  Transportation Needs: No Transportation Needs (01/02/2023)   PRAPARE - Administrator, Civil Service (Medical): No    Lack of Transportation (Non-Medical): No  Physical Activity: Insufficiently Active (01/02/2023)   Exercise Vital Sign    Days of Exercise per Week: 2 days    Minutes of Exercise per Session: 20 min  Stress: Patient Declined (01/02/2023)   [employer] of Occupational Health - Occupational Stress Questionnaire    Feeling of Stress : Patient declined  Social Connections: Unknown (01/02/2023)   Social Connection  and Isolation Panel    Frequency of Communication with Friends and Family: Not on file    Frequency of Social Gatherings with Friends and Family: Not on file    Attends Religious Services: Not on file    Active Member of Clubs or Organizations: No    Attends Banker Meetings: Not on file    Marital Status: Not on file    MEDICATIONS:  Current Outpatient Medications  Medication Sig Dispense Refill   abacavir -dolutegravir -lamiVUDine (TRIUMEQ ) 600-50-300 MG tablet Take 1 tablet by mouth daily. 90 tablet 3   albuterol  (VENTOLIN  HFA) 108 (90 Base) MCG/ACT inhaler INHALE 2 PUFFS BY MOUTH CHEST EVERY 4 TO 6 HOURS AS NEEDED FOR COUGH OR WHEEZING OR TIGHTNESS OR SHORTNESS OF BREATH 8.5 g 2   amLODipine  (NORVASC ) 10 MG tablet Take 1 tablet (10 mg total) by mouth daily. for blood pressure. 90 tablet 2   Blood Glucose Monitoring Suppl DEVI 1 each by Does not apply route in the morning, at noon, and at bedtime. May substitute to any manufacturer covered by patient's insurance. 1 each 0   ciclopirox  (PENLAC ) 8 % solution APPLY 1 COAT TO TOENAIL EVERY DAY FOR 48 WEEKS. REMOVE WEEKLY WITH POLISH REMOVER 6.6 mL 11   cromolyn  (OPTICROM ) 4 % ophthalmic solution 2 drops in each eye 1 - 4 times daily as needed for red, itchy eyes. 10 mL 12   EPINEPHrine  0.3 mg/0.3 mL IJ SOAJ injection INJECT 0.3 MG IN THE MUSCLE AS NEEDED FOR ANAPHYLAXIS 2 each 1   esomeprazole  (NEXIUM ) 40 MG capsule Take 1 capsule (40 mg total) by mouth daily. 30 capsule 5   famotidine  (PEPCID ) 40 MG tablet Take 1 tablet (40 mg total) by mouth daily. 30 tablet 0   fexofenadine  (ALLEGRA ) 180 MG tablet Take 1 tablet (180 mg total) by mouth daily. 15 tablet 0   fluticasone  (FLONASE ) 50 MCG/ACT nasal spray SHAKE WELL USE 1 OR 2 SPRAYS IN EACH NOSTRIL EVERYDAY FOR NASAL CONGESTION 16 mL 5   fluticasone -salmeterol (ADVAIR  HFA) 230-21 MCG/ACT inhaler INHALE 2 PUFFS BY MOUTH TWICE DAILY 12 g 5   glipiZIDE  (GLUCOTROL  XL) 10 MG 24 hr tablet  Take 1 tablet (10 mg total) by mouth daily with breakfast. for diabetes. 90 tablet 0   Glucose Blood (BLOOD GLUCOSE TEST STRIPS) STRP 1 each by In Vitro route in the morning, at noon, and at bedtime. May substitute to any manufacturer covered by patient's insurance. 300 strip 0   ibuprofen  (ADVIL ) 800 MG tablet Take 800 mg by mouth 3 (three) times daily as needed.     LANCETS MICRO THIN 33G MISC USE TO TEST BLOOD SUGAR ONCE A DAY 100 each 2   Lancets Misc. MISC 1  each by Does not apply route in the morning, at noon, and at bedtime. May substitute to any manufacturer covered by patient's insurance. 300 each 0   levothyroxine  (SYNTHROID ) 150 MCG tablet Take 1 tablet (150 mcg total) by mouth daily before breakfast. And additional 1/2 tab once a week. 92 tablet 4   metFORMIN  (GLUCOPHAGE -XR) 500 MG 24 hr tablet Take 1 tablet (500 mg total) by mouth 2 (two) times daily with a meal. for diabetes. 180 tablet 0   montelukast  (SINGULAIR ) 10 MG tablet TAKE 1 TABLET(10 MG) BY MOUTH DAILY 90 tablet 1   Multiple Vitamin (MULTIVITAMIN) tablet Take 1 tablet by mouth daily.     omalizumab  (XOLAIR ) 300 MG/2  ML prefilled syringe Inject 300 mg into the skin every 14 (fourteen) days. 4 mL 11   rosuvastatin  (CRESTOR ) 5 MG tablet Take 1 tablet (5 mg total) by mouth daily. for cholesterol. 90 tablet 2   Semaglutide , 1 MG/DOSE, 4 MG/3ML SOPN Inject 1 mg as directed once a week. for diabetes. 9 mL 0   spironolactone  (ALDACTONE ) 25 MG tablet Take 1 tablet (25 mg total) by mouth daily. for blood pressure. 90 tablet 2   Current Facility-Administered Medications  Medication Dose Route Frequency Provider Last Rate Last Admin   omalizumab  (XOLAIR ) prefilled syringe 300 mg  300 mg Subcutaneous Q14 Days Cari Arlean HERO, FNP   300 mg at 02/09/24 0903    PHYSICAL EXAM: Vitals:   02/12/24 0808  BP: 132/84  Pulse: 90  Resp: 20  SpO2: 96%  Weight: (!) 302 lb 6.4 oz (137.2 kg)  Height: 6' (1.829 m)   Body mass index is 41.01  kg/m.   General: Well developed, well nourished male in no apparent distress.  HEENT: AT/[place], no external lesions. Hearing intact to the spoken word Eyes: Conjunctiva clear and no icterus.  Bilateral mild proptosis present.   Neck: Trachea midline, neck supple  Abdomen: Soft Neurologic: Alert, oriented, normal speech, deep tendon biceps reflexes normal,  no gross focal neurological deficit Extremities: no tremors of outstretched hands Skin: Warm, color good.  Psychiatric: Does not appear depressed or anxious  PERTINENT HISTORIC LABORATORY AND IMAGING STUDIES:  All pertinent laboratory results were reviewed. Please see HPI also for further details.   TSH  Date Value Ref Range Status  02/13/2023 0.66 0.35 - 5.50 uIU/mL Final  08/23/2022 2.19 0.35 - 5.50 uIU/mL Final  05/19/2022 6.046 (H) 0.350 - 4.500 uIU/mL Final    Comment:    Performed by a 3rd Generation assay with a functional sensitivity of <=0.01 uIU/mL. Performed at [facility], [address] [place], [place] [address]      ASSESSMENT / PLAN  1. Hypothyroidism, postradioiodine therapy   2. H/O Graves' disease     Patient had a history of Graves' disease status post radioactive iodine ablation in 2012 and developed postablative hypothyroidism.  -Patient is currently taking levothyroxine  150 mcg daily and half tab once a week.  Plan: -Will check thyroid  function test and adjust the dose of levothyroxine  as needed.  -Annual follow-up with annual thyroid  lab.  Diagnoses and all orders for this visit:  Hypothyroidism, postradioiodine therapy -     T4, free -     TSH  H/O Graves' disease    DISPOSITION Follow up in clinic in 12 months suggested.  All questions answered and patient verbalized understanding of the plan.  [place] Arlo Butt, MD [facility] Endocrinology [facility] [address] [place], [address] [place],  [place] [address] Phone # [phone]  At least part of this note was generated  using voice recognition software. Inadvertent word errors may have occurred, which were not recognized during the proofreading process.

## 2024-02-13 ENCOUNTER — Ambulatory Visit: Payer: Self-pay | Admitting: Endocrinology

## 2024-02-13 ENCOUNTER — Other Ambulatory Visit: Payer: Self-pay | Admitting: Endocrinology

## 2024-02-13 DIAGNOSIS — E890 Postprocedural hypothyroidism: Secondary | ICD-10-CM

## 2024-02-13 MED ORDER — LEVOTHYROXINE SODIUM 150 MCG PO TABS: 150.0000 ug | ORAL_TABLET | Freq: Every day | ORAL | 4 refills | Status: DC

## 2024-02-15 ENCOUNTER — Ambulatory Visit: Admitting: Primary Care

## 2024-02-19 ENCOUNTER — Ambulatory Visit (INDEPENDENT_AMBULATORY_CARE_PROVIDER_SITE_OTHER): Admitting: Podiatry

## 2024-02-19 DIAGNOSIS — Z91198 Patient's noncompliance with other medical treatment and regimen for other reason: Secondary | ICD-10-CM

## 2024-02-19 NOTE — Progress Notes (Signed)
 1. Failure to attend appointment with reason given    Rescheduled appointment.

## 2024-02-20 ENCOUNTER — Other Ambulatory Visit: Payer: Self-pay

## 2024-02-20 DIAGNOSIS — E1165 Type 2 diabetes mellitus with hyperglycemia: Secondary | ICD-10-CM

## 2024-02-20 DIAGNOSIS — Z419 Encounter for procedure for purposes other than remedying health state, unspecified: Secondary | ICD-10-CM | POA: Diagnosis not present

## 2024-02-20 MED ORDER — BLOOD GLUCOSE TEST VI STRP: 1.0000 | ORAL_STRIP | Freq: Three times a day (TID) | 0 refills | Status: AC

## 2024-02-20 NOTE — Telephone Encounter (Signed)
 Lvmtcb, sent mychart message

## 2024-02-20 NOTE — Telephone Encounter (Signed)
 Patient no showed his recent diabetes appointment. Can we get him rescheduled?

## 2024-02-22 ENCOUNTER — Encounter (HOSPITAL_COMMUNITY)

## 2024-02-23 ENCOUNTER — Ambulatory Visit

## 2024-02-26 ENCOUNTER — Encounter (HOSPITAL_COMMUNITY)

## 2024-02-27 ENCOUNTER — Ambulatory Visit (INDEPENDENT_AMBULATORY_CARE_PROVIDER_SITE_OTHER)

## 2024-02-27 DIAGNOSIS — J4550 Severe persistent asthma, uncomplicated: Secondary | ICD-10-CM

## 2024-03-04 ENCOUNTER — Ambulatory Visit (HOSPITAL_COMMUNITY)
Admission: RE | Admit: 2024-03-04 | Discharge: 2024-03-04 | Disposition: A | Discharge status: Home / Self Care | Source: Ambulatory Visit | Attending: Infectious Diseases | Admitting: Infectious Diseases

## 2024-03-04 DIAGNOSIS — Z21 Asymptomatic human immunodeficiency virus [HIV] infection status: Secondary | ICD-10-CM | POA: Diagnosis present

## 2024-03-04 DIAGNOSIS — B20 Human immunodeficiency virus [HIV] disease: Secondary | ICD-10-CM | POA: Diagnosis present

## 2024-03-04 MED ORDER — CABOTEGRAVIR & RILPIVIRINE ER 600 & 900 MG/3ML IM SUER
1.0000 | INTRAMUSCULAR | Status: DC
Start: 2024-03-04 — End: 2024-03-05
  Administered 2024-03-04: 1 via INTRAMUSCULAR
  Filled 2024-03-04: qty 6

## 2024-03-14 ENCOUNTER — Ambulatory Visit (INDEPENDENT_AMBULATORY_CARE_PROVIDER_SITE_OTHER)

## 2024-03-14 DIAGNOSIS — J4550 Severe persistent asthma, uncomplicated: Secondary | ICD-10-CM | POA: Diagnosis not present

## 2024-03-20 ENCOUNTER — Ambulatory Visit (INDEPENDENT_AMBULATORY_CARE_PROVIDER_SITE_OTHER): Admitting: Primary Care

## 2024-03-20 ENCOUNTER — Ambulatory Visit: Payer: Self-pay | Admitting: Primary Care

## 2024-03-20 ENCOUNTER — Encounter: Payer: Self-pay | Admitting: Primary Care

## 2024-03-20 VITALS — BP 116/78 | HR 88 | Temp 97.4°F | Ht 72.0 in | Wt 299.0 lb

## 2024-03-20 DIAGNOSIS — E1165 Type 2 diabetes mellitus with hyperglycemia: Secondary | ICD-10-CM

## 2024-03-20 DIAGNOSIS — Z23 Encounter for immunization: Secondary | ICD-10-CM

## 2024-03-20 DIAGNOSIS — Z7984 Long term (current) use of oral hypoglycemic drugs: Secondary | ICD-10-CM | POA: Diagnosis not present

## 2024-03-20 LAB — POCT GLYCOSYLATED HEMOGLOBIN (HGB A1C): Hemoglobin A1C: 8.9 % — AB (ref 4.0–5.6)

## 2024-03-20 LAB — MICROALBUMIN / CREATININE URINE RATIO
Creatinine,U: 197.9 mg/dL
Microalb Creat Ratio: 1210 mg/g — ABNORMAL HIGH (ref 0.0–30.0)
Microalb, Ur: 239.5 mg/dL — ABNORMAL HIGH (ref 0.0–1.9)

## 2024-03-20 MED ORDER — SEMAGLUTIDE (2 MG/DOSE) 8 MG/3ML ~~LOC~~ SOPN: 2.0000 mg | PEN_INJECTOR | SUBCUTANEOUS | 0 refills | Status: DC

## 2024-03-20 NOTE — Progress Notes (Signed)
 Subjective:    Patient ID: Steven [name], male    DOB: 03/12/1981, 43 y.o.   MRN: [id number]  Steven [name] is a very pleasant 43 y.o. male with a history of type 2 diabetes, HIV, hyperlipidemia, hypertension who presents today for follow up of diabetes.  Current medications include: Glipizide  XL 10 mg daily, metformin  ER 500 mg twice daily, Ozempic  1 mg weekly  He is checking his blood glucose 2 times daily and is getting readings of:  AM fasting: 108-180 2 hours after dinner: 225-280  Last A1C: 7.4 in May 2025, 8.9 today Last Eye Exam: UTD Last Foot Exam: UTD Pneumonia Vaccination: 2019 Urine Microalbumin: Due Statin: rosuvastatin    Dietary changes since last visit: Recenty increased intake of fruits and veggies. He does snack often at work on chips.    Exercise: Walking 30 minutes daily  BP Readings from Last 3 Encounters:  03/20/24 116/78  03/04/24 (!) 150/104  02/12/24 132/84       Review of Systems  Eyes:  Negative for visual disturbance.  Respiratory:  Negative for shortness of breath.   Cardiovascular:  Negative for chest pain.  Neurological:  Negative for numbness.         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   Qualifier: Diagnosis of   By: Yong MD, Nneka         Syphilis 03/23/2009   1:2   Thrush, oral 03/23/2015    Social History   Socioeconomic History   Marital status: Single    Spouse name: Not on file   Number of children: Not on file   Years of education: Not on file   Highest education level: Not on file  Occupational History   Not on file  Tobacco Use   Smoking status: Never   Smokeless tobacco: Never  Vaping Use   Vaping status: Never Used  Substance and Sexual Activity   Alcohol use: Yes     Alcohol/week: 0.0 standard drinks of alcohol    Comment: socially - less now   Drug use: No   Sexual activity: Not Currently    Partners: Male    Birth control/protection: Condom    Comment: pt. declined condoms  Other Topics Concern   Not on file  Social History Narrative   Single.    No children.   Works in Clinical biochemist   Enjoys DJ, traveling.    Social Drivers of Corporate investment banker Strain: Low Risk  (01/02/2023)   Overall Financial Resource Strain (CARDIA)    Difficulty of Paying Living Expenses: Not very hard  Food Insecurity: Not on file  Transportation Needs: No Transportation Needs (01/02/2023)   PRAPARE - Administrator, Civil Service (Medical): No    Lack of Transportation (Non-Medical): No  Physical Activity: Insufficiently Active (01/02/2023)   Exercise Vital Sign    Days of Exercise per Week: 2 days    Minutes of Exercise per Session: 20 min  Stress: Patient Declined (01/02/2023)   [employer] of Occupational Health - Occupational Stress Questionnaire    Feeling of Stress : Patient declined  Social Connections: Unknown (01/02/2023)   Social Connection and Isolation Panel    Frequency of Communication with Friends  and Family: Not on file    Frequency of Social Gatherings with Friends and Family: Not on file    Attends Religious Services: Not on file    Active Member of Clubs or Organizations: No    Attends Banker Meetings: Not on file    Marital Status: Not on file  Intimate Partner Violence: Unknown (10/15/2021)   Received from [facility]   HITS    Physically Hurt: Not on file    Insult or Talk Down To: Not on file    Threaten Physical Harm: Not on file    Scream or Curse: Not on file    Past Surgical History:  Procedure Laterality Date   BRAIN SURGERY     IR GENERIC HISTORICAL  02/12/2016   IR REMOVAL TUN ACCESS W/ PORT W/O FL MOD SED 02/12/2016 Juliene Balder, MD [facility]-INTER[facility] RAD    Family History  Problem Relation Age  of Onset   Pancreatic cancer Mother    Cancer Mother    Diabetes Paternal Grandmother    Thyroid  disease Paternal Grandmother     Allergies  Allergen Reactions   Lisinopril Swelling    Swelling of lower lip while on lisinopril   Penicillins Hives and Swelling    REACTION: rash and swelling   Levaquin [Levofloxacin In D5w] Rash   Tessalon [Benzonatate] Rash   Mushroom Swelling   Shellfish Allergy Swelling   Aspirin Nausea Only    Current Outpatient Medications on File Prior to Visit  Medication Sig Dispense Refill   abacavir -dolutegravir -lamiVUDine (TRIUMEQ ) 600-50-300 MG tablet Take 1 tablet by mouth daily. 90 tablet 3   albuterol  (VENTOLIN  HFA) 108 (90 Base) MCG/ACT inhaler INHALE 2 PUFFS BY MOUTH CHEST EVERY 4 TO 6 HOURS AS NEEDED FOR COUGH OR WHEEZING OR TIGHTNESS OR SHORTNESS OF BREATH 8.5 g 2   amLODipine  (NORVASC ) 10 MG tablet Take 1 tablet (10 mg total) by mouth daily. for blood pressure. 90 tablet 2   Blood Glucose Monitoring Suppl DEVI 1 each by Does not apply route in the morning, at noon, and at bedtime. May substitute to any manufacturer covered by patient's insurance. 1 each 0   ciclopirox  (PENLAC ) 8 % solution APPLY 1 COAT TO TOENAIL EVERY DAY FOR 48 WEEKS. REMOVE WEEKLY WITH POLISH REMOVER 6.6 mL 11   cromolyn  (OPTICROM ) 4 % ophthalmic solution 2 drops in each eye 1 - 4 times daily as needed for red, itchy eyes. 10 mL 12   EPINEPHrine  0.3 mg/0.3 mL IJ SOAJ injection INJECT 0.3 MG IN THE MUSCLE AS NEEDED FOR ANAPHYLAXIS 2 each 1   esomeprazole  (NEXIUM ) 40 MG capsule Take 1 capsule (40 mg total) by mouth daily. 30 capsule 5   famotidine  (PEPCID ) 40 MG tablet Take 1 tablet (40 mg total) by mouth daily. 30 tablet 0   fexofenadine  (ALLEGRA ) 180 MG tablet Take 1 tablet (180 mg total) by mouth daily. 15 tablet 0   fluticasone  (FLONASE ) 50 MCG/ACT nasal spray SHAKE WELL USE 1 OR 2 SPRAYS IN EACH NOSTRIL EVERYDAY FOR NASAL CONGESTION 16 mL 5   fluticasone -salmeterol (ADVAIR   HFA) 230-21 MCG/ACT inhaler INHALE 2 PUFFS BY MOUTH TWICE DAILY 12 g 5   glipiZIDE  (GLUCOTROL  XL) 10 MG 24 hr tablet Take 1 tablet (10 mg total) by mouth daily with breakfast. for diabetes. 90 tablet 0   Glucose Blood (BLOOD GLUCOSE TEST STRIPS) STRP 1 each by In Vitro route in the morning, at noon, and at bedtime. May substitute to any manufacturer covered  by patient's insurance. 300 strip 0   ibuprofen  (ADVIL ) 800 MG tablet Take 800 mg by mouth 3 (three) times daily as needed.     LANCETS MICRO THIN 33G MISC USE TO TEST BLOOD SUGAR ONCE A DAY 100 each 2   Lancets Misc. MISC 1 each by Does not apply route in the morning, at noon, and at bedtime. May substitute to any manufacturer covered by patient's insurance. 300 each 0   levothyroxine  (SYNTHROID ) 150 MCG tablet Take 1 tablet (150 mcg total) by mouth daily before breakfast. And additional 1/2 tab once a week. 92 tablet 4   metFORMIN  (GLUCOPHAGE -XR) 500 MG 24 hr tablet Take 1 tablet (500 mg total) by mouth 2 (two) times daily with a meal. for diabetes. 180 tablet 0   montelukast  (SINGULAIR ) 10 MG tablet TAKE 1 TABLET(10 MG) BY MOUTH DAILY 90 tablet 1   Multiple Vitamin (MULTIVITAMIN) tablet Take 1 tablet by mouth daily.     omalizumab  (XOLAIR ) 300 MG/2  ML prefilled syringe Inject 300 mg into the skin every 14 (fourteen) days. 4 mL 11   rosuvastatin  (CRESTOR ) 5 MG tablet Take 1 tablet (5 mg total) by mouth daily. for cholesterol. 90 tablet 2   Semaglutide , 1 MG/DOSE, 4 MG/3ML SOPN Inject 1 mg as directed once a week. for diabetes. 9 mL 0   spironolactone  (ALDACTONE ) 25 MG tablet Take 1 tablet (25 mg total) by mouth daily. for blood pressure. 90 tablet 2   Current Facility-Administered Medications on File Prior to Visit  Medication Dose Route Frequency Provider Last Rate Last Admin   omalizumab  (XOLAIR ) prefilled syringe 300 mg  300 mg Subcutaneous Q14 Days Cari Arlean HERO, FNP   300 mg at 03/14/24 0847    There were no vitals taken for this  visit. Objective:   Physical Exam Cardiovascular:     Rate and Rhythm: Normal rate and regular rhythm.  Pulmonary:     Effort: Pulmonary effort is normal.     Breath sounds: Normal breath sounds.  Musculoskeletal:     Cervical back: Neck supple.  Skin:    General: Skin is warm and dry.  Neurological:     Mental Status: He is alert and oriented to person, place, and time.  Psychiatric:        Mood and Affect: Mood normal.     Physical Exam        Assessment & Plan:  There are no diagnoses linked to this encounter.  Assessment and Plan Assessment & Plan         Comer MARLA Gaskins, NP     History of Present Illness

## 2024-03-20 NOTE — Progress Notes (Signed)
 Established Patient Office Visit  Subjective   Patient ID: Steven [name], male    DOB: February 07, 1981  Age: 43 y.o. MRN: [id number]  Chief Complaint  Patient presents with   Medical Management of Chronic Issues    HPI Steven [name] is a 43 year male with a history of type 2 diabetes, HIV, and hypertension here for a follow-up for diabetes.   Current medications include: Metformin  500 mg BID, Semaglutide  1 mg/week.  He is checking his blood glucose 2 times daily and is getting readings of 108-160 in the morning and readings of  225-280 two hours after dinner.   Last A1C: 8.9 Last Eye Exam: Seen 2-3 months ago Last Foot Exam: Scheduled for tomorrow 03/21/24 Pneumonia Vaccination: Denies Urine Microalbumin: Collected Statin:Rosuvastatin   Dietary changes since last visit:Diet changes include more vegetables and fruit, but does snack at work.   Exercise: Walks 30 minutes daily.   Review of Systems  Constitutional: Negative.   HENT: Negative.    Eyes: Negative.   Respiratory: Negative.    Cardiovascular: Negative.   Gastrointestinal: Negative.   Musculoskeletal: Negative.   Neurological: Negative.   Endo/Heme/Allergies: Negative.       Objective:     BP 116/78   Pulse 88   Temp (!) 97.4 F (36.3 C) (Temporal)   Ht 6' (1.829 m)   Wt 135.6 kg   SpO2 96%   BMI 40.55 kg/m    Physical Exam HENT:     Head: Normocephalic.     Mouth/Throat:     Mouth: Mucous membranes are moist.  Eyes:     Extraocular Movements: Extraocular movements intact.  Cardiovascular:     Rate and Rhythm: Normal rate and regular rhythm.  Pulmonary:     Effort: Pulmonary effort is normal.     Breath sounds: Normal breath sounds.  Skin:    General: Skin is warm and dry.  Neurological:     Mental Status: He is alert and oriented to person, place, and time.      Results for orders placed or performed in visit on 03/20/24  POCT glycosylated hemoglobin (Hb A1C)  Result Value Ref Range    Hemoglobin A1C 8.9 (A) 4.0 - 5.6 %   HbA1c POC (<> result, manual entry)     HbA1c, POC (prediabetic range)     HbA1c, POC (controlled diabetic range)        The ASCVD Risk score (Arnett DK, et al., 2019) failed to calculate for the following reasons:   The valid total cholesterol range is 130 to 320 mg/dL    Assessment & Plan:   Problem List Items Addressed This Visit       Endocrine   Type 2 diabetes mellitus with hyperglycemia (HCC) - Primary   Uncontrolled with A1C of 8.9  Given increased food cravings, coupled with A1C level, will Increase Ozempic  to 2 mg/week.  Continue metformin  ER 500 mg BID, Glipizide  XL 10 mg daily.  Urine microalbumin pending.  Follow up in 3 months.   I evaluated patient, was consulted regarding treatment, and agree with assessment and plan per Adian Jablonowski, MSN, FNP student.   Mallie Gaskins, NP-C       Relevant Medications   Semaglutide , 2 MG/DOSE, 8 MG/3ML SOPN   Other Relevant Orders   POCT glycosylated hemoglobin (Hb A1C) (Completed)   Microalbumin/Creatinine Ratio, Urine   Other Visit Diagnoses       Encounter for immunization       Relevant Orders  Flu vaccine trivalent PF, 6mos and older(Flulaval,Afluria,Fluarix,Fluzone) (Completed)       No follow-ups on file.    Genevra Orne, RN

## 2024-03-20 NOTE — Patient Instructions (Signed)
 Stop by the lab prior to leaving today. I will notify you of your results once received.   We increased your ozempic  to 2 mg or diabetes.   Please schedule a follow up visit for 3 months.

## 2024-03-20 NOTE — Assessment & Plan Note (Addendum)
 Uncontrolled with A1C of 8.9  Given increased food cravings, coupled with A1C level, will Increase Ozempic  to 2 mg/week.  Continue metformin  ER 500 mg BID, Glipizide  XL 10 mg daily.  Urine microalbumin pending.  Follow up in 3 months.   I evaluated patient, was consulted regarding treatment, and agree with assessment and plan per Cadin Luka, MSN, FNP student.   Mallie Gaskins, NP-C

## 2024-03-21 ENCOUNTER — Encounter: Payer: Self-pay | Admitting: Pharmacist

## 2024-03-21 ENCOUNTER — Ambulatory Visit (INDEPENDENT_AMBULATORY_CARE_PROVIDER_SITE_OTHER): Admitting: Podiatry

## 2024-03-21 ENCOUNTER — Encounter: Payer: Self-pay | Admitting: Podiatry

## 2024-03-21 DIAGNOSIS — E1122 Type 2 diabetes mellitus with diabetic chronic kidney disease: Secondary | ICD-10-CM

## 2024-03-21 DIAGNOSIS — M79676 Pain in unspecified toe(s): Secondary | ICD-10-CM

## 2024-03-21 DIAGNOSIS — B351 Tinea unguium: Secondary | ICD-10-CM

## 2024-03-21 DIAGNOSIS — N182 Chronic kidney disease, stage 2 (mild): Secondary | ICD-10-CM

## 2024-03-21 NOTE — Progress Notes (Signed)
 Chart Review Reason: Drug information Question - SGLT2i consideration with current meds/comorbidity  Summary: SGLT2i consideration in the setting of macroalbuminuria.  Appears hx f/c angioedema on ACEi previously.   UACR: 1210 mg/g eGCR: >60 A1c: 8.9%  DM Medication: Metfomrin XR 500 mg BID Ozempic  2 mg weekly  Glipizide  XL 10 mg daily   Considerations: DDI:  No concerns with current antiviral regimen  No concerning DDI with any medication on current medication list.   Chronic conditions:  HTN: BP elevated at last office visit, prior visit 116/78. Only diuretic is spironolactone  at this time. SGLT2i start reasonable. Some see small degree of BP-lowering on SGLT2i, continue to monitor.  DM2: With current A1c of 8.7%, reasonable to add Jardiance  or Farxiga 10 mg daily without change in current regimen unless any concern hypoglycemia on the glipizide  then consider dose reduction. Expected A1c benefit ~1 point on average.  No significant concerns with other chronic conditions and SGLT2i   Steven [name], PharmD Clinical Pharmacist [facility] [phone]

## 2024-03-21 NOTE — Progress Notes (Signed)
 Noted. Will initiate SGLT2 inhibitor.  See result note.

## 2024-03-22 ENCOUNTER — Other Ambulatory Visit (HOSPITAL_COMMUNITY): Payer: Self-pay | Admitting: Infectious Diseases

## 2024-03-22 DIAGNOSIS — Z419 Encounter for procedure for purposes other than remedying health state, unspecified: Secondary | ICD-10-CM | POA: Diagnosis not present

## 2024-03-22 DIAGNOSIS — B20 Human immunodeficiency virus [HIV] disease: Secondary | ICD-10-CM | POA: Insufficient documentation

## 2024-03-22 MED ORDER — EMPAGLIFLOZIN 10 MG PO TABS: 10.0000 mg | ORAL_TABLET | Freq: Every day | ORAL | 0 refills | Status: DC

## 2024-03-24 ENCOUNTER — Encounter: Payer: Self-pay | Admitting: Podiatry

## 2024-03-24 NOTE — Progress Notes (Signed)
  Subjective:  Patient ID: Steven [name], male    DOB: 1980-11-27,  MRN: [id number]  Steven [name] presents to clinic today for preventative diabetic foot care for painful mycotic toenails of both feet that are difficult to trim. Pain interferes with daily activities and wearing enclosed shoe gear comfortably.   New problem(s): None.   PCP is Gretta Comer POUR, NP. ARNETTA 03/20/2024.  Allergies  Allergen Reactions   Lisinopril Swelling    Swelling of lower lip while on lisinopril   Penicillins Hives and Swelling    REACTION: rash and swelling   Levaquin [Levofloxacin In D5w] Rash   Tessalon [Benzonatate] Rash   Mushroom Swelling   Shellfish Allergy Swelling   Aspirin Nausea Only    Review of Systems: Negative except as noted in the HPI.  Objective: No changes noted in today's physical examination. There were no vitals filed for this visit. Steven [name] is a pleasant 43 y.o. male obese in NAD. AAO x 3.  Vascular Examination: Capillary refill time immediate b/l. Palpable pedal pulses. Pedal hair present b/l. No pain with calf compression b/l. Skin temperature gradient WNL b/l. No cyanosis or clubbing b/l. No ischemia or gangrene noted b/l. Lymphedema present BLE.  Neurological Examination: Sensation grossly intact b/l with 10 gram monofilament. Vibratory sensation intact b/l.   Dermatological Examination: Pedal skin with normal turgor, texture and tone b/l.  No open wounds. No interdigital macerations.   Toenails 1-5 b/l thick, discolored, elongated with subungual debris and pain on dorsal palpation.   No hyperkeratotic nor porokeratotic lesions.  Musculoskeletal Examination: Muscle strength 5/5 to all lower extremity muscle groups bilaterally. Pes planus deformity noted bilateral LE. Patient ambulates independent of any assistive aids.  Radiographs: None  Last A1c:      Latest Ref Rng & Units 03/20/2024    8:05 AM 11/15/2023    8:50 AM 08/17/2023    8:18 AM 04/27/2023     3:35 PM  Hemoglobin A1C  Hemoglobin-A1c 4.0 - 5.6 % 8.9  7.4  6.8  8.6    Assessment/Plan: 1. Pain due to onychomycosis of toenail   2. Type 2 diabetes mellitus with stage 2 chronic kidney disease, without long-term current use of insulin (HCC)   Patient was evaluated and treated. All patient's and/or POA's questions/concerns addressed on today's visit. Mycotic toenails 1-5 debrided in length and girth without incident.  Continue daily foot inspections and monitor blood glucose per PCP/Endocrinologist's recommendations.Continue soft, supportive shoe gear daily. Report any pedal injuries to medical professional. Call office if there are any quesitons/concerns. -Patient/POA to call should there be question/concern in the interim.   Return in about 3 months (around 06/20/2024).  Steven [name], DPM      [place] LOCATION: [address] [place] [address]                   Office [phone]   [facility] LOCATION: [address] [place], [place] [address] Office [phone]

## 2024-03-28 ENCOUNTER — Ambulatory Visit

## 2024-03-28 DIAGNOSIS — J4550 Severe persistent asthma, uncomplicated: Secondary | ICD-10-CM

## 2024-04-02 ENCOUNTER — Other Ambulatory Visit: Payer: Self-pay

## 2024-04-02 DIAGNOSIS — E119 Type 2 diabetes mellitus without complications: Secondary | ICD-10-CM

## 2024-04-02 MED ORDER — METFORMIN HCL ER 500 MG PO TB24: 500.0000 mg | ORAL_TABLET | Freq: Two times a day (BID) | ORAL | 1 refills | Status: AC

## 2024-04-11 ENCOUNTER — Ambulatory Visit

## 2024-04-15 ENCOUNTER — Other Ambulatory Visit: Payer: Self-pay | Admitting: Endocrinology

## 2024-04-15 DIAGNOSIS — E890 Postprocedural hypothyroidism: Secondary | ICD-10-CM

## 2024-04-18 ENCOUNTER — Ambulatory Visit

## 2024-04-18 DIAGNOSIS — J4550 Severe persistent asthma, uncomplicated: Secondary | ICD-10-CM | POA: Diagnosis not present

## 2024-04-30 ENCOUNTER — Other Ambulatory Visit: Payer: Self-pay

## 2024-04-30 DIAGNOSIS — E1165 Type 2 diabetes mellitus with hyperglycemia: Secondary | ICD-10-CM

## 2024-05-01 MED ORDER — GLIPIZIDE ER 10 MG PO TB24: 10.0000 mg | ORAL_TABLET | Freq: Every day | ORAL | 1 refills | Status: AC

## 2024-05-02 ENCOUNTER — Ambulatory Visit

## 2024-05-03 ENCOUNTER — Inpatient Hospital Stay (HOSPITAL_COMMUNITY): Admission: RE | Admit: 2024-05-03 | Source: Ambulatory Visit

## 2024-05-13 ENCOUNTER — Other Ambulatory Visit: Payer: Self-pay | Admitting: *Deleted

## 2024-05-13 ENCOUNTER — Ambulatory Visit (HOSPITAL_COMMUNITY)
Admission: RE | Admit: 2024-05-13 | Discharge: 2024-05-13 | Disposition: A | Discharge status: Home / Self Care | Source: Ambulatory Visit | Attending: Infectious Diseases | Admitting: Infectious Diseases

## 2024-05-13 ENCOUNTER — Ambulatory Visit

## 2024-05-13 VITALS — BP 144/91 | HR 94 | Temp 98.3°F | Resp 16

## 2024-05-13 DIAGNOSIS — I10 Essential (primary) hypertension: Secondary | ICD-10-CM

## 2024-05-13 DIAGNOSIS — B20 Human immunodeficiency virus [HIV] disease: Secondary | ICD-10-CM | POA: Diagnosis present

## 2024-05-13 MED ORDER — CABOTEGRAVIR & RILPIVIRINE ER 600 & 900 MG/3ML IM SUER
1.0000 | Freq: Once | INTRAMUSCULAR | Status: AC
  Administered 2024-05-13: 1 via INTRAMUSCULAR
  Filled 2024-05-13: qty 6

## 2024-05-14 ENCOUNTER — Ambulatory Visit

## 2024-05-14 NOTE — Progress Notes (Signed)
 Refill too soon

## 2024-05-24 ENCOUNTER — Ambulatory Visit: Admitting: *Deleted

## 2024-05-24 DIAGNOSIS — J4550 Severe persistent asthma, uncomplicated: Secondary | ICD-10-CM | POA: Diagnosis not present

## 2024-05-27 DIAGNOSIS — E1165 Type 2 diabetes mellitus with hyperglycemia: Secondary | ICD-10-CM

## 2024-05-27 MED ORDER — EMPAGLIFLOZIN 10 MG PO TABS: 10.0000 mg | ORAL_TABLET | Freq: Every day | ORAL | 0 refills | Status: AC

## 2024-06-14 ENCOUNTER — Ambulatory Visit

## 2024-06-20 ENCOUNTER — Ambulatory Visit: Admitting: Podiatry

## 2024-06-20 ENCOUNTER — Encounter: Payer: Self-pay | Admitting: Podiatry

## 2024-06-20 DIAGNOSIS — N182 Chronic kidney disease, stage 2 (mild): Secondary | ICD-10-CM | POA: Diagnosis not present

## 2024-06-20 DIAGNOSIS — M79676 Pain in unspecified toe(s): Secondary | ICD-10-CM | POA: Diagnosis not present

## 2024-06-20 DIAGNOSIS — B351 Tinea unguium: Secondary | ICD-10-CM | POA: Diagnosis not present

## 2024-06-20 DIAGNOSIS — E1122 Type 2 diabetes mellitus with diabetic chronic kidney disease: Secondary | ICD-10-CM

## 2024-06-21 ENCOUNTER — Ambulatory Visit: Admitting: Primary Care

## 2024-06-28 ENCOUNTER — Ambulatory Visit: Payer: Self-pay | Admitting: Primary Care

## 2024-06-28 ENCOUNTER — Ambulatory Visit: Admitting: Primary Care

## 2024-06-28 VITALS — BP 114/82 | HR 98 | Temp 97.8°F | Ht 72.0 in | Wt 293.8 lb

## 2024-06-28 DIAGNOSIS — E1165 Type 2 diabetes mellitus with hyperglycemia: Secondary | ICD-10-CM | POA: Diagnosis not present

## 2024-06-28 DIAGNOSIS — Z7984 Long term (current) use of oral hypoglycemic drugs: Secondary | ICD-10-CM

## 2024-06-28 DIAGNOSIS — Z7985 Long-term (current) use of injectable non-insulin antidiabetic drugs: Secondary | ICD-10-CM | POA: Diagnosis not present

## 2024-06-28 LAB — POCT GLYCOSYLATED HEMOGLOBIN (HGB A1C): Hemoglobin A1C: 6.2 % — AB (ref 4.0–5.6)

## 2024-06-28 NOTE — Patient Instructions (Signed)
 Please schedule a physical to meet with me in 3 months.   Keep up the great work!

## 2024-06-28 NOTE — Assessment & Plan Note (Signed)
 Improved and controlled with A1c of 6.2 today!  Continue metformin  ER 500 mg twice daily, Jardiance  10 mg daily, Ozempic  2 mg weekly, glipizide  XL 10 mg daily.  Close follow-up in 3 months.  If A1c remains lower then consider reducing/eliminating medication.

## 2024-06-28 NOTE — Progress Notes (Signed)
 "  Subjective:    Patient ID: Steven [name], male    DOB: 27-May-1981, 43 y.o.   MRN: [id number]  Steven [name] is a very pleasant 43 y.o. male with a history of hypertension, severe asthma, hypothyroidism, CKD, HIV who presents today for follow-up of diabetes.  1) Type 2 Diabetes:  Current medications include: Ozempic  2 mg weekly, metformin  ER 500 mg twice daily, glipizide  XL 10 mg daily, Jardiance  10 mg daily.  He is checking his blood glucose 2 times daily and is getting readings of:  AM fasting: low 100s 2 hours after dinner: 140-200s  Last A1C: 8.9 in September 2025, 6.2 today Last Eye Exam: UTD Last Foot Exam: Up-to-date Pneumonia Vaccination: 2022 Urine Microalbumin: Up-to-date Statin: Rosuvastatin   Dietary changes since last visit: He has cut out snacking, especially late at night.    Exercise: Walking  Wt Readings from Last 3 Encounters:  06/28/24 293 lb 12.8 oz (133.3 kg)  03/20/24 299 lb (135.6 kg)  02/12/24 (!) 302 lb 6.4 oz (137.2 kg)      Review of Systems  Eyes:  Negative for visual disturbance.  Respiratory:  Negative for shortness of breath.   Cardiovascular:  Negative for chest pain.  Endocrine: Positive for polydipsia.  Neurological:  Negative for numbness.         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   Qualifier: Diagnosis of   By: Yong MD, Nneka         Syphilis 03/23/2009   1:2   Thrush, oral 03/23/2015    Social History   Socioeconomic History   Marital status: Single    Spouse name: Not on file   Number of children: Not on file   Years of education: Not on file   Highest education level: Not on file  Occupational History   Not on file  Tobacco Use   Smoking status: Never   Smokeless tobacco: Never   Vaping Use   Vaping status: Never Used  Substance and Sexual Activity   Alcohol use: Yes    Alcohol/week: 0.0 standard drinks of alcohol    Comment: socially - less now   Drug use: No   Sexual activity: Not Currently    Partners: Male    Birth control/protection: Condom    Comment: pt. declined condoms  Other Topics Concern   Not on file  Social History Narrative   Single.    No children.   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    Social Drivers of Health   Tobacco Use: Low Risk (06/20/2024)   Patient History    Smoking Tobacco Use: Never    Smokeless Tobacco Use: Never    Passive Exposure: Not on file  Financial Resource Strain: Low Risk (01/02/2023)   Overall Financial Resource Strain (CARDIA)    Difficulty of Paying Living Expenses: Not very hard  Food Insecurity: Not on file  Transportation Needs: No Transportation Needs (01/02/2023)   PRAPARE - Transportation    Lack of Transportation (Medical): No    Lack of Transportation (Non-Medical): No  Physical Activity: Insufficiently Active (01/02/2023)   Exercise Vital Sign    Days of Exercise per Week: 2 days    Minutes of Exercise per  Session: 20 min  Stress: Patient Declined (01/02/2023)   [employer] of Occupational Health - Occupational Stress Questionnaire    Feeling of Stress : Patient declined  Social Connections: Unknown (01/02/2023)   Social Connection and Isolation Panel    Frequency of Communication with Friends and Family: Not on file    Frequency of Social Gatherings with Friends and Family: Not on file    Attends Religious Services: Not on file    Active Member of Clubs or Organizations: No    Attends Banker Meetings: Not on file    Marital Status: Not on file  Intimate Partner Violence: Not on file  Depression (PHQ2-9): Low Risk (06/28/2024)   Depression (PHQ2-9)    PHQ-2 Score: 0  Alcohol Screen: Not on file  Housing: Low Risk (01/02/2023)   Housing    Last Housing  Risk Score: 0  Utilities: Not on file  Health Literacy: Not on file    Past Surgical History:  Procedure Laterality Date   BRAIN SURGERY     IR GENERIC HISTORICAL  02/12/2016   IR REMOVAL TUN ACCESS W/ PORT W/O FL MOD SED 02/12/2016 Juliene Balder, MD [facility]    Family History  Problem Relation Age of Onset   Pancreatic cancer Mother    Cancer Mother    Diabetes Paternal Grandmother    Thyroid  disease Paternal Grandmother     Allergies[1]  Medications Ordered Prior to Encounter[2]  BP 114/82   Pulse 98   Temp 97.8 F (36.6 C) (Oral)   Ht 6' (1.829 m)   Wt 293 lb 12.8 oz (133.3 kg)   SpO2 95%   BMI 39.85 kg/m  Objective:   Physical Exam Cardiovascular:     Rate and Rhythm: Normal rate and regular rhythm.  Pulmonary:     Effort: Pulmonary effort is normal.     Breath sounds: Normal breath sounds.  Musculoskeletal:     Cervical back: Neck supple.  Skin:    General: Skin is warm and dry.  Neurological:     Mental Status: He is alert and oriented to person, place, and time.  Psychiatric:        Mood and Affect: Mood normal.     Physical Exam        Assessment & Plan:  Type 2 diabetes mellitus with hyperglycemia, without long-term current use of insulin (HCC) Assessment & Plan: Improved and controlled with A1c of 6.2 today!  Continue metformin  ER 500 mg twice daily, Jardiance  10 mg daily, Ozempic  2 mg weekly, glipizide  XL 10 mg daily.  Close follow-up in 3 months.  If A1c remains lower then consider reducing/eliminating medication.   Orders: -     POCT glycosylated hemoglobin (Hb A1C)    Assessment and Plan Assessment & Plan         Comer MARLA Gaskins, NP       [1]  Allergies Allergen Reactions   Lisinopril Swelling    Swelling of lower lip while on lisinopril   Penicillins Hives and Swelling    REACTION: rash and swelling   Levaquin [Levofloxacin In D5w] Rash   Tessalon [Benzonatate] Rash   Mushroom Swelling   Shellfish Allergy  Swelling   Aspirin Nausea Only  [2]  Current Outpatient Medications on File Prior to Visit  Medication Sig Dispense Refill   albuterol  (VENTOLIN  HFA) 108 (90 Base) MCG/ACT inhaler INHALE 2 PUFFS BY MOUTH CHEST EVERY 4 TO 6 HOURS AS NEEDED FOR COUGH OR WHEEZING OR TIGHTNESS  OR SHORTNESS OF BREATH 8.5 g 2   amLODipine  (NORVASC ) 10 MG tablet Take 1 tablet (10 mg total) by mouth daily. for blood pressure. 90 tablet 2   Blood Glucose Monitoring Suppl DEVI 1 each by Does not apply route in the morning, at noon, and at bedtime. May substitute to any manufacturer covered by patient's insurance. 1 each 0   ciclopirox  (PENLAC ) 8 % solution APPLY 1 COAT TO TOENAIL EVERY DAY FOR 48 WEEKS. REMOVE WEEKLY WITH POLISH REMOVER 6.6 mL 11   cromolyn  (OPTICROM ) 4 % ophthalmic solution 2 drops in each eye 1 - 4 times daily as needed for red, itchy eyes. 10 mL 12   empagliflozin  (JARDIANCE ) 10 MG TABS tablet Take 1 tablet (10 mg total) by mouth daily. for diabetes. 90 tablet 0   EPINEPHrine  0.3 mg/0.3 mL IJ SOAJ injection INJECT 0.3 MG IN THE MUSCLE AS NEEDED FOR ANAPHYLAXIS 2 each 1   esomeprazole  (NEXIUM ) 40 MG capsule Take 1 capsule (40 mg total) by mouth daily. 30 capsule 5   famotidine  (PEPCID ) 40 MG tablet Take 1 tablet (40 mg total) by mouth daily. 30 tablet 0   fexofenadine  (ALLEGRA ) 180 MG tablet Take 1 tablet (180 mg total) by mouth daily. 15 tablet 0   fluticasone  (FLONASE ) 50 MCG/ACT nasal spray SHAKE WELL USE 1 OR 2 SPRAYS IN EACH NOSTRIL EVERYDAY FOR NASAL CONGESTION 16 mL 5   fluticasone -salmeterol (ADVAIR  HFA) 230-21 MCG/ACT inhaler INHALE 2 PUFFS BY MOUTH TWICE DAILY 12 g 5   glipiZIDE  (GLUCOTROL  XL) 10 MG 24 hr tablet Take 1 tablet (10 mg total) by mouth daily with breakfast. for diabetes. 90 tablet 1   Glucose Blood (BLOOD GLUCOSE TEST STRIPS) STRP 1 each by In Vitro route in the morning, at noon, and at bedtime. May substitute to any manufacturer covered by patient's insurance. 300 strip 0    ibuprofen (ADVIL) 800 MG tablet Take 800 mg by mouth 3 (three) times daily as needed.     LANCETS MICRO THIN 33G MISC USE TO TEST BLOOD SUGAR ONCE A DAY 100 each 2   Lancets Misc. MISC 1 each by Does not apply route in the morning, at noon, and at bedtime. May substitute to any manufacturer covered by patient's insurance. 300 each 0   levothyroxine  (SYNTHROID ) 150 MCG tablet TAKE 1 TABLET BY MOUTH DAILY BEFORE BREAKFAST PLUS AN ADDITIONAL 1/2 TABLET ONCE A WEEK 92 tablet 4   metFORMIN  (GLUCOPHAGE -XR) 500 MG 24 hr tablet Take 1 tablet (500 mg total) by mouth 2 (two) times daily with a meal. for diabetes. 180 tablet 1   montelukast  (SINGULAIR ) 10 MG tablet TAKE 1 TABLET(10 MG) BY MOUTH DAILY 90 tablet 1   Multiple Vitamin (MULTIVITAMIN) tablet Take 1 tablet by mouth daily.     omalizumab  (XOLAIR ) 300 MG/2  ML prefilled syringe Inject 300 mg into the skin every 14 (fourteen) days. 4 mL 11   rosuvastatin  (CRESTOR ) 5 MG tablet Take 1 tablet (5 mg total) by mouth daily. for cholesterol. 90 tablet 2   Semaglutide , 2 MG/DOSE, 8 MG/3ML SOPN Inject 2 mg as directed once a week. For diabetes. 9 mL 0   spironolactone  (ALDACTONE ) 25 MG tablet Take 1 tablet (25 mg total) by mouth daily. for blood pressure. 90 tablet 2   Current Facility-Administered Medications on File Prior to Visit  Medication Dose Route Frequency Provider Last Rate Last Admin   omalizumab  (XOLAIR ) prefilled syringe 300 mg  300 mg Subcutaneous Q14 Days Ambs, Arlean  M, FNP   300 mg at 05/24/24 1328   "

## 2024-06-29 ENCOUNTER — Encounter: Payer: Self-pay | Admitting: Podiatry

## 2024-06-29 NOTE — Progress Notes (Signed)
"  °  Subjective:  Patient ID: Steven [name], male    DOB: 1980/11/27,  MRN: [id number]  Steven [name] presents to clinic today for preventative diabetic foot care for painful thick toenails that are difficult to trim. Pain interferes with ambulation. Aggravating factors include wearing enclosed shoe gear. Pain is relieved with periodic professional debridement.  Chief Complaint  Patient presents with   Nail Problem    Thick painful toenails, 3 month follow up    Diabetes    A1C  8.9   New problem(s): None.   PCP is Gretta Comer POUR, NP. Steven [name] 03/20/24.  Allergies[1]  Review of Systems: Negative except as noted in the HPI.  Objective: No changes noted in today's physical examination. There were no vitals filed for this visit. Steven [name] is a pleasant 43 y.o. male morbidly obese in NAD. AAO x 3.  Vascular Examination: Capillary refill time immediate b/l. Palpable pedal pulses. Pedal hair present b/l. No pain with calf compression b/l. Skin temperature gradient WNL b/l. No cyanosis or clubbing b/l. No ischemia or gangrene noted b/l. Lymphedema present BLE.  Neurological Examination: Sensation grossly intact b/l with 10 gram monofilament. Vibratory sensation intact b/l.   Dermatological Examination: Pedal skin with normal turgor, texture and tone b/l.  No open wounds. No interdigital macerations.   Toenails 1-5 b/l thick, discolored, elongated with subungual debris and pain on dorsal palpation.   No hyperkeratotic nor porokeratotic lesions.  Musculoskeletal Examination: Muscle strength 5/5 to all lower extremity muscle groups bilaterally. Pes planus deformity noted bilateral LE. Patient ambulates independent of any assistive aids.  Radiographs: None  Assessment/Plan: 1. Pain due to onychomycosis of toenail   2. 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 Patient examined. All  patient's and/or POA's questions/concerns addressed on today's visit. Toenails 1-5 b/l debrided in length and girth without incident. Continue foot and shoe inspections daily. Monitor blood glucose per PCP/Endocrinologist's recommendations. Continue soft, supportive shoe gear daily. Report any pedal injuries to medical professional. Call office if there are any questions/concerns. -Patient/POA to call should there be question/concern in the interim.   Return in about 3 months (around 09/18/2024).  Steven [name], DPM      [place] LOCATION: [address] [place], [place] [address]                   Office [phone]   [facility] LOCATION: [address] [place], [place] [address] Office [phone]     [1]  Allergies Allergen Reactions   Lisinopril Swelling    Swelling of lower lip while on lisinopril   Penicillins Hives and Swelling    REACTION: rash and swelling   Levaquin [Levofloxacin In D5w] Rash   Tessalon [Benzonatate] Rash   Mushroom Swelling   Shellfish Allergy Swelling   Aspirin Nausea Only   "

## 2024-07-05 ENCOUNTER — Encounter (HOSPITAL_COMMUNITY)

## 2024-07-15 ENCOUNTER — Ambulatory Visit (HOSPITAL_COMMUNITY)
Admission: RE | Admit: 2024-07-15 | Discharge: 2024-07-15 | Disposition: A | Discharge status: Home / Self Care | Source: Ambulatory Visit | Attending: Infectious Diseases | Admitting: Infectious Diseases

## 2024-07-15 VITALS — BP 145/84 | HR 98 | Temp 99.7°F | Resp 16

## 2024-07-15 DIAGNOSIS — B20 Human immunodeficiency virus [HIV] disease: Secondary | ICD-10-CM | POA: Insufficient documentation

## 2024-07-15 MED ORDER — CABOTEGRAVIR & RILPIVIRINE ER 600 & 900 MG/3ML IM SUER
1.0000 | Freq: Once | INTRAMUSCULAR | Status: AC
  Administered 2024-07-15: 1 via INTRAMUSCULAR
  Filled 2024-07-15: qty 6

## 2024-07-22 ENCOUNTER — Other Ambulatory Visit: Payer: Self-pay

## 2024-07-22 DIAGNOSIS — E1165 Type 2 diabetes mellitus with hyperglycemia: Secondary | ICD-10-CM

## 2024-07-22 MED ORDER — SEMAGLUTIDE (2 MG/DOSE) 8 MG/3ML ~~LOC~~ SOPN: 2.0000 mg | PEN_INJECTOR | SUBCUTANEOUS | 0 refills | Status: AC

## 2024-07-25 ENCOUNTER — Other Ambulatory Visit: Payer: Self-pay | Admitting: *Deleted

## 2024-07-25 DIAGNOSIS — I10 Essential (primary) hypertension: Secondary | ICD-10-CM

## 2024-07-25 MED ORDER — AMLODIPINE BESYLATE 10 MG PO TABS: 10.0000 mg | ORAL_TABLET | Freq: Every day | ORAL | 0 refills | Status: AC

## 2024-07-29 DIAGNOSIS — E785 Hyperlipidemia, unspecified: Secondary | ICD-10-CM

## 2024-07-29 DIAGNOSIS — I10 Essential (primary) hypertension: Secondary | ICD-10-CM

## 2024-07-29 MED ORDER — ROSUVASTATIN CALCIUM 5 MG PO TABS: 5.0000 mg | ORAL_TABLET | Freq: Every day | ORAL | 0 refills | Status: AC

## 2024-07-29 MED ORDER — SPIRONOLACTONE 25 MG PO TABS: 25.0000 mg | ORAL_TABLET | Freq: Every day | ORAL | 0 refills | Status: AC

## 2024-08-01 ENCOUNTER — Telehealth: Payer: Self-pay | Admitting: Family Medicine

## 2024-08-01 NOTE — Telephone Encounter (Signed)
 Left voicemail to give the office a call back to schedule Xolair  reapproval appointment.

## 2024-08-02 ENCOUNTER — Other Ambulatory Visit: Payer: Self-pay | Admitting: Primary Care

## 2024-08-02 DIAGNOSIS — E1165 Type 2 diabetes mellitus with hyperglycemia: Secondary | ICD-10-CM

## 2024-08-21 ENCOUNTER — Encounter: Admitting: Primary Care

## 2024-09-13 ENCOUNTER — Encounter (HOSPITAL_COMMUNITY)

## 2024-10-08 ENCOUNTER — Ambulatory Visit: Admitting: Podiatry

## 2025-02-11 ENCOUNTER — Ambulatory Visit: Admitting: Endocrinology
